# Patient Record
Sex: Male | Born: 1935 | Race: White | Hispanic: No | Marital: Married | Smoking: Former smoker
Health system: Southern US, Community
[De-identification: ages and names within clinical notes are randomized; demographics above are authoritative.]

## PROBLEM LIST (undated history)

## (undated) DIAGNOSIS — I739 Peripheral vascular disease, unspecified: Secondary | ICD-10-CM

## (undated) DIAGNOSIS — M1990 Unspecified osteoarthritis, unspecified site: Secondary | ICD-10-CM

## (undated) DIAGNOSIS — E669 Obesity, unspecified: Secondary | ICD-10-CM

## (undated) DIAGNOSIS — I10 Essential (primary) hypertension: Secondary | ICD-10-CM

## (undated) DIAGNOSIS — G4733 Obstructive sleep apnea (adult) (pediatric): Secondary | ICD-10-CM

## (undated) DIAGNOSIS — I96 Gangrene, not elsewhere classified: Secondary | ICD-10-CM

## (undated) DIAGNOSIS — I495 Sick sinus syndrome: Secondary | ICD-10-CM

## (undated) DIAGNOSIS — I2510 Atherosclerotic heart disease of native coronary artery without angina pectoris: Secondary | ICD-10-CM

## (undated) DIAGNOSIS — E785 Hyperlipidemia, unspecified: Secondary | ICD-10-CM

## (undated) DIAGNOSIS — R413 Other amnesia: Secondary | ICD-10-CM

## (undated) DIAGNOSIS — I350 Nonrheumatic aortic (valve) stenosis: Secondary | ICD-10-CM

## (undated) DIAGNOSIS — I509 Heart failure, unspecified: Secondary | ICD-10-CM

## (undated) DIAGNOSIS — Z952 Presence of prosthetic heart valve: Secondary | ICD-10-CM

## (undated) DIAGNOSIS — C4490 Unspecified malignant neoplasm of skin, unspecified: Secondary | ICD-10-CM

## (undated) DIAGNOSIS — I442 Atrioventricular block, complete: Secondary | ICD-10-CM

## (undated) DIAGNOSIS — Z87442 Personal history of urinary calculi: Secondary | ICD-10-CM

## (undated) DIAGNOSIS — N529 Male erectile dysfunction, unspecified: Secondary | ICD-10-CM

## (undated) DIAGNOSIS — Z950 Presence of cardiac pacemaker: Secondary | ICD-10-CM

## (undated) DIAGNOSIS — E119 Type 2 diabetes mellitus without complications: Secondary | ICD-10-CM

## (undated) DIAGNOSIS — I4891 Unspecified atrial fibrillation: Secondary | ICD-10-CM

## (undated) HISTORY — DX: Presence of prosthetic heart valve: Z95.2

## (undated) HISTORY — DX: Heart failure, unspecified: I50.9

## (undated) HISTORY — PX: AORTIC VALVE REPLACEMENT: SHX41

## (undated) HISTORY — DX: Peripheral vascular disease, unspecified: I73.9

## (undated) HISTORY — DX: Type 2 diabetes mellitus without complications: E11.9

## (undated) HISTORY — PX: TONSILLECTOMY: SUR1361

## (undated) HISTORY — DX: Other amnesia: R41.3

## (undated) HISTORY — DX: Presence of cardiac pacemaker: Z95.0

## (undated) HISTORY — DX: Nonrheumatic aortic (valve) stenosis: I35.0

## (undated) HISTORY — PX: EYE SURGERY: SHX253

## (undated) HISTORY — DX: Gangrene, not elsewhere classified: I96

## (undated) HISTORY — DX: Atrioventricular block, complete: I44.2

## (undated) HISTORY — DX: Personal history of urinary calculi: Z87.442

## (undated) HISTORY — DX: Essential (primary) hypertension: I10

## (undated) HISTORY — DX: Sick sinus syndrome: I49.5

## (undated) HISTORY — DX: Hyperlipidemia, unspecified: E78.5

## (undated) HISTORY — DX: Male erectile dysfunction, unspecified: N52.9

## (undated) HISTORY — DX: Obstructive sleep apnea (adult) (pediatric): G47.33

## (undated) HISTORY — DX: Unspecified atrial fibrillation: I48.91

## (undated) HISTORY — DX: Obesity, unspecified: E66.9

## (undated) HISTORY — PX: CATARACT EXTRACTION, BILATERAL: SHX1313

## (undated) HISTORY — DX: Unspecified malignant neoplasm of skin, unspecified: C44.90

## (undated) HISTORY — DX: Unspecified osteoarthritis, unspecified site: M19.90

---

## 1999-02-04 ENCOUNTER — Emergency Department (HOSPITAL_COMMUNITY): Admission: EM | Admit: 1999-02-04 | Discharge: 1999-02-04 | Payer: Self-pay

## 1999-03-20 ENCOUNTER — Ambulatory Visit (HOSPITAL_COMMUNITY): Admission: RE | Admit: 1999-03-20 | Discharge: 1999-03-22 | Payer: Self-pay | Admitting: *Deleted

## 1999-03-20 ENCOUNTER — Encounter: Payer: Self-pay | Admitting: *Deleted

## 1999-07-22 ENCOUNTER — Encounter: Payer: Self-pay | Admitting: Emergency Medicine

## 1999-07-22 ENCOUNTER — Inpatient Hospital Stay (HOSPITAL_COMMUNITY): Admission: EM | Admit: 1999-07-22 | Discharge: 1999-07-23 | Payer: Self-pay | Admitting: Emergency Medicine

## 1999-07-25 ENCOUNTER — Ambulatory Visit (HOSPITAL_COMMUNITY): Admission: RE | Admit: 1999-07-25 | Discharge: 1999-07-25 | Payer: Self-pay | Admitting: Cardiology

## 1999-07-29 ENCOUNTER — Encounter: Payer: Self-pay | Admitting: Cardiology

## 1999-07-29 ENCOUNTER — Ambulatory Visit (HOSPITAL_COMMUNITY): Admission: RE | Admit: 1999-07-29 | Discharge: 1999-07-29 | Payer: Self-pay | Admitting: Cardiology

## 1999-09-19 ENCOUNTER — Ambulatory Visit (HOSPITAL_COMMUNITY): Admission: RE | Admit: 1999-09-19 | Discharge: 1999-09-19 | Payer: Self-pay | Admitting: Cardiology

## 1999-12-09 ENCOUNTER — Encounter: Admission: RE | Admit: 1999-12-09 | Discharge: 2000-03-08 | Payer: Self-pay | Admitting: Anesthesiology

## 2000-01-30 ENCOUNTER — Ambulatory Visit (HOSPITAL_BASED_OUTPATIENT_CLINIC_OR_DEPARTMENT_OTHER): Admission: RE | Admit: 2000-01-30 | Discharge: 2000-01-30 | Payer: Self-pay | Admitting: Orthopedic Surgery

## 2000-08-14 ENCOUNTER — Inpatient Hospital Stay (HOSPITAL_COMMUNITY): Admission: EM | Admit: 2000-08-14 | Discharge: 2000-08-18 | Payer: Self-pay | Admitting: Emergency Medicine

## 2000-08-14 ENCOUNTER — Encounter: Payer: Self-pay | Admitting: Emergency Medicine

## 2001-01-19 ENCOUNTER — Other Ambulatory Visit: Admission: RE | Admit: 2001-01-19 | Discharge: 2001-01-19 | Payer: Self-pay | Admitting: Gastroenterology

## 2001-01-19 ENCOUNTER — Encounter (INDEPENDENT_AMBULATORY_CARE_PROVIDER_SITE_OTHER): Payer: Self-pay

## 2001-04-07 ENCOUNTER — Ambulatory Visit (HOSPITAL_COMMUNITY): Admission: RE | Admit: 2001-04-07 | Discharge: 2001-04-08 | Payer: Self-pay | Admitting: Cardiology

## 2001-11-05 ENCOUNTER — Ambulatory Visit (HOSPITAL_COMMUNITY): Admission: RE | Admit: 2001-11-05 | Discharge: 2001-11-05 | Payer: Self-pay | Admitting: Cardiology

## 2001-11-05 ENCOUNTER — Encounter: Payer: Self-pay | Admitting: Cardiology

## 2002-08-10 ENCOUNTER — Ambulatory Visit (HOSPITAL_COMMUNITY): Admission: RE | Admit: 2002-08-10 | Discharge: 2002-08-10 | Payer: Self-pay | Admitting: Internal Medicine

## 2002-11-15 ENCOUNTER — Encounter (HOSPITAL_BASED_OUTPATIENT_CLINIC_OR_DEPARTMENT_OTHER): Admission: RE | Admit: 2002-11-15 | Discharge: 2003-02-13 | Payer: Self-pay | Admitting: Internal Medicine

## 2002-12-30 ENCOUNTER — Encounter: Payer: Self-pay | Admitting: Emergency Medicine

## 2002-12-30 ENCOUNTER — Inpatient Hospital Stay (HOSPITAL_COMMUNITY): Admission: EM | Admit: 2002-12-30 | Discharge: 2003-01-04 | Payer: Self-pay | Admitting: Emergency Medicine

## 2003-06-06 ENCOUNTER — Encounter: Admission: RE | Admit: 2003-06-06 | Discharge: 2003-06-23 | Payer: Self-pay | Admitting: Family Medicine

## 2003-06-23 ENCOUNTER — Encounter (HOSPITAL_BASED_OUTPATIENT_CLINIC_OR_DEPARTMENT_OTHER): Admission: RE | Admit: 2003-06-23 | Discharge: 2003-07-11 | Payer: Self-pay | Admitting: Internal Medicine

## 2003-06-28 ENCOUNTER — Encounter: Admission: RE | Admit: 2003-06-28 | Discharge: 2003-09-26 | Payer: Self-pay | Admitting: Family Medicine

## 2003-09-23 ENCOUNTER — Inpatient Hospital Stay (HOSPITAL_COMMUNITY): Admission: EM | Admit: 2003-09-23 | Discharge: 2003-09-26 | Payer: Self-pay | Admitting: Emergency Medicine

## 2003-09-28 ENCOUNTER — Encounter (HOSPITAL_BASED_OUTPATIENT_CLINIC_OR_DEPARTMENT_OTHER): Admission: RE | Admit: 2003-09-28 | Discharge: 2003-10-17 | Payer: Self-pay | Admitting: Internal Medicine

## 2004-01-03 ENCOUNTER — Encounter (HOSPITAL_BASED_OUTPATIENT_CLINIC_OR_DEPARTMENT_OTHER): Admission: RE | Admit: 2004-01-03 | Discharge: 2004-01-26 | Payer: Self-pay | Admitting: Internal Medicine

## 2004-04-10 ENCOUNTER — Encounter (HOSPITAL_BASED_OUTPATIENT_CLINIC_OR_DEPARTMENT_OTHER): Admission: RE | Admit: 2004-04-10 | Discharge: 2004-04-29 | Payer: Self-pay | Admitting: Internal Medicine

## 2004-05-20 ENCOUNTER — Ambulatory Visit: Payer: Self-pay | Admitting: Internal Medicine

## 2004-06-23 DIAGNOSIS — I350 Nonrheumatic aortic (valve) stenosis: Secondary | ICD-10-CM

## 2004-06-23 HISTORY — PX: CORONARY ARTERY BYPASS GRAFT: SHX141

## 2004-06-23 HISTORY — DX: Nonrheumatic aortic (valve) stenosis: I35.0

## 2004-07-02 ENCOUNTER — Ambulatory Visit: Payer: Self-pay | Admitting: Family Medicine

## 2004-07-09 ENCOUNTER — Ambulatory Visit: Payer: Self-pay | Admitting: Cardiology

## 2004-07-10 ENCOUNTER — Ambulatory Visit: Payer: Self-pay | Admitting: Internal Medicine

## 2004-07-11 ENCOUNTER — Inpatient Hospital Stay (HOSPITAL_COMMUNITY): Admission: AD | Admit: 2004-07-11 | Discharge: 2004-07-26 | Payer: Self-pay | Admitting: Cardiology

## 2004-07-12 ENCOUNTER — Encounter: Payer: Self-pay | Admitting: Cardiology

## 2004-07-15 ENCOUNTER — Encounter (INDEPENDENT_AMBULATORY_CARE_PROVIDER_SITE_OTHER): Payer: Self-pay | Admitting: *Deleted

## 2004-07-23 ENCOUNTER — Encounter: Payer: Self-pay | Admitting: Cardiology

## 2004-07-23 ENCOUNTER — Ambulatory Visit: Payer: Self-pay | Admitting: Cardiology

## 2004-07-30 ENCOUNTER — Ambulatory Visit: Payer: Self-pay | Admitting: Cardiology

## 2004-08-01 ENCOUNTER — Encounter (HOSPITAL_BASED_OUTPATIENT_CLINIC_OR_DEPARTMENT_OTHER): Admission: RE | Admit: 2004-08-01 | Discharge: 2004-08-21 | Payer: Self-pay | Admitting: Internal Medicine

## 2004-08-02 ENCOUNTER — Ambulatory Visit: Payer: Self-pay | Admitting: Cardiology

## 2004-08-02 ENCOUNTER — Ambulatory Visit: Payer: Self-pay

## 2004-08-14 ENCOUNTER — Ambulatory Visit: Payer: Self-pay | Admitting: Cardiology

## 2004-08-30 ENCOUNTER — Ambulatory Visit: Payer: Self-pay | Admitting: Internal Medicine

## 2004-09-05 ENCOUNTER — Ambulatory Visit: Payer: Self-pay | Admitting: Cardiology

## 2004-09-09 ENCOUNTER — Ambulatory Visit: Payer: Self-pay | Admitting: Cardiology

## 2004-09-10 ENCOUNTER — Ambulatory Visit: Payer: Self-pay | Admitting: Cardiology

## 2004-09-10 ENCOUNTER — Ambulatory Visit: Payer: Self-pay | Admitting: Internal Medicine

## 2004-09-11 ENCOUNTER — Inpatient Hospital Stay (HOSPITAL_COMMUNITY): Admission: AD | Admit: 2004-09-11 | Discharge: 2004-09-13 | Payer: Self-pay | Admitting: Cardiology

## 2004-09-11 ENCOUNTER — Ambulatory Visit: Payer: Self-pay | Admitting: Cardiology

## 2004-09-12 ENCOUNTER — Encounter: Payer: Self-pay | Admitting: Cardiology

## 2004-09-12 ENCOUNTER — Ambulatory Visit: Payer: Self-pay | Admitting: Cardiology

## 2004-09-17 ENCOUNTER — Ambulatory Visit: Payer: Self-pay | Admitting: Cardiology

## 2004-09-19 ENCOUNTER — Ambulatory Visit: Payer: Self-pay | Admitting: Cardiology

## 2004-09-25 ENCOUNTER — Ambulatory Visit: Payer: Self-pay | Admitting: Internal Medicine

## 2004-09-25 ENCOUNTER — Ambulatory Visit: Payer: Self-pay | Admitting: Cardiology

## 2004-09-26 ENCOUNTER — Ambulatory Visit: Payer: Self-pay | Admitting: Family Medicine

## 2004-10-07 ENCOUNTER — Encounter (HOSPITAL_COMMUNITY): Admission: RE | Admit: 2004-10-07 | Discharge: 2005-01-05 | Payer: Self-pay | Admitting: Cardiology

## 2004-10-10 ENCOUNTER — Ambulatory Visit: Payer: Self-pay | Admitting: Internal Medicine

## 2004-10-24 ENCOUNTER — Ambulatory Visit: Payer: Self-pay | Admitting: Cardiology

## 2004-10-24 ENCOUNTER — Ambulatory Visit: Payer: Self-pay | Admitting: Internal Medicine

## 2004-10-31 ENCOUNTER — Ambulatory Visit: Payer: Self-pay | Admitting: Cardiology

## 2004-11-07 ENCOUNTER — Ambulatory Visit: Payer: Self-pay | Admitting: Cardiology

## 2004-11-13 ENCOUNTER — Ambulatory Visit: Payer: Self-pay | Admitting: *Deleted

## 2004-11-22 ENCOUNTER — Ambulatory Visit: Payer: Self-pay | Admitting: *Deleted

## 2004-12-09 ENCOUNTER — Ambulatory Visit: Payer: Self-pay | Admitting: Cardiology

## 2004-12-13 ENCOUNTER — Ambulatory Visit: Payer: Self-pay | Admitting: Cardiology

## 2004-12-16 ENCOUNTER — Ambulatory Visit: Payer: Self-pay | Admitting: Cardiology

## 2004-12-23 ENCOUNTER — Ambulatory Visit: Payer: Self-pay | Admitting: Cardiology

## 2005-01-03 ENCOUNTER — Ambulatory Visit: Payer: Self-pay | Admitting: Cardiology

## 2005-01-06 ENCOUNTER — Ambulatory Visit: Payer: Self-pay | Admitting: Internal Medicine

## 2005-01-06 ENCOUNTER — Encounter (HOSPITAL_COMMUNITY): Admission: RE | Admit: 2005-01-06 | Discharge: 2005-04-06 | Payer: Self-pay | Admitting: Cardiology

## 2005-01-07 ENCOUNTER — Ambulatory Visit: Payer: Self-pay | Admitting: Internal Medicine

## 2005-01-21 ENCOUNTER — Ambulatory Visit: Payer: Self-pay | Admitting: Internal Medicine

## 2005-02-19 ENCOUNTER — Ambulatory Visit: Payer: Self-pay | Admitting: *Deleted

## 2005-02-19 ENCOUNTER — Ambulatory Visit: Payer: Self-pay | Admitting: Cardiology

## 2005-02-26 ENCOUNTER — Ambulatory Visit: Payer: Self-pay | Admitting: Cardiology

## 2005-03-19 ENCOUNTER — Ambulatory Visit: Payer: Self-pay | Admitting: Cardiology

## 2005-04-07 ENCOUNTER — Ambulatory Visit: Payer: Self-pay | Admitting: Internal Medicine

## 2005-04-09 ENCOUNTER — Ambulatory Visit: Payer: Self-pay | Admitting: Cardiology

## 2005-04-23 ENCOUNTER — Ambulatory Visit: Payer: Self-pay | Admitting: *Deleted

## 2005-04-29 ENCOUNTER — Ambulatory Visit: Payer: Self-pay | Admitting: Cardiology

## 2005-05-07 ENCOUNTER — Ambulatory Visit: Payer: Self-pay | Admitting: Cardiology

## 2005-05-08 ENCOUNTER — Ambulatory Visit: Payer: Self-pay | Admitting: Family Medicine

## 2005-05-14 ENCOUNTER — Ambulatory Visit: Payer: Self-pay | Admitting: Internal Medicine

## 2005-05-21 ENCOUNTER — Ambulatory Visit: Payer: Self-pay | Admitting: Cardiology

## 2005-06-04 ENCOUNTER — Ambulatory Visit: Payer: Self-pay | Admitting: Cardiology

## 2005-06-11 ENCOUNTER — Ambulatory Visit: Payer: Self-pay | Admitting: Cardiology

## 2005-06-25 ENCOUNTER — Ambulatory Visit: Payer: Self-pay | Admitting: Internal Medicine

## 2005-07-02 ENCOUNTER — Ambulatory Visit: Payer: Self-pay | Admitting: *Deleted

## 2005-07-23 ENCOUNTER — Ambulatory Visit: Payer: Self-pay | Admitting: Cardiology

## 2005-07-23 ENCOUNTER — Ambulatory Visit: Payer: Self-pay | Admitting: Internal Medicine

## 2005-07-24 DIAGNOSIS — Z950 Presence of cardiac pacemaker: Secondary | ICD-10-CM

## 2005-07-24 HISTORY — PX: PACEMAKER PLACEMENT: SHX43

## 2005-07-24 HISTORY — DX: Presence of cardiac pacemaker: Z95.0

## 2005-07-29 ENCOUNTER — Ambulatory Visit: Payer: Self-pay | Admitting: Cardiology

## 2005-07-29 ENCOUNTER — Inpatient Hospital Stay (HOSPITAL_COMMUNITY): Admission: RE | Admit: 2005-07-29 | Discharge: 2005-07-30 | Payer: Self-pay | Admitting: Cardiology

## 2005-08-04 ENCOUNTER — Ambulatory Visit: Payer: Self-pay | Admitting: Internal Medicine

## 2005-08-06 ENCOUNTER — Ambulatory Visit: Payer: Self-pay | Admitting: Internal Medicine

## 2005-08-18 ENCOUNTER — Ambulatory Visit: Payer: Self-pay

## 2005-08-18 ENCOUNTER — Ambulatory Visit: Payer: Self-pay | Admitting: Internal Medicine

## 2005-08-19 ENCOUNTER — Ambulatory Visit: Payer: Self-pay | Admitting: Cardiology

## 2005-09-01 ENCOUNTER — Ambulatory Visit: Payer: Self-pay | Admitting: Cardiology

## 2005-09-05 ENCOUNTER — Ambulatory Visit: Payer: Self-pay

## 2005-09-15 ENCOUNTER — Ambulatory Visit: Payer: Self-pay | Admitting: Cardiology

## 2005-09-18 ENCOUNTER — Ambulatory Visit: Payer: Self-pay | Admitting: Cardiology

## 2005-10-01 ENCOUNTER — Ambulatory Visit: Payer: Self-pay | Admitting: *Deleted

## 2005-10-22 ENCOUNTER — Ambulatory Visit: Payer: Self-pay | Admitting: Internal Medicine

## 2005-10-22 ENCOUNTER — Ambulatory Visit: Payer: Self-pay | Admitting: Cardiology

## 2005-11-04 ENCOUNTER — Ambulatory Visit: Payer: Self-pay | Admitting: Gastroenterology

## 2005-11-05 ENCOUNTER — Ambulatory Visit: Payer: Self-pay | Admitting: Cardiology

## 2005-11-21 ENCOUNTER — Ambulatory Visit: Payer: Self-pay | Admitting: Cardiology

## 2005-12-03 ENCOUNTER — Ambulatory Visit: Payer: Self-pay | Admitting: Cardiovascular Disease

## 2005-12-29 ENCOUNTER — Ambulatory Visit: Payer: Self-pay | Admitting: Internal Medicine

## 2006-01-20 ENCOUNTER — Ambulatory Visit: Payer: Self-pay | Admitting: Internal Medicine

## 2006-01-23 ENCOUNTER — Ambulatory Visit: Payer: Self-pay | Admitting: Internal Medicine

## 2006-02-09 ENCOUNTER — Ambulatory Visit: Payer: Self-pay | Admitting: Internal Medicine

## 2006-02-25 ENCOUNTER — Ambulatory Visit: Payer: Self-pay | Admitting: *Deleted

## 2006-02-25 ENCOUNTER — Ambulatory Visit: Payer: Self-pay | Admitting: Cardiology

## 2006-03-03 ENCOUNTER — Ambulatory Visit: Payer: Self-pay | Admitting: Internal Medicine

## 2006-03-10 ENCOUNTER — Ambulatory Visit: Payer: Self-pay | Admitting: Cardiology

## 2006-03-12 ENCOUNTER — Ambulatory Visit: Payer: Self-pay

## 2006-03-12 ENCOUNTER — Encounter: Payer: Self-pay | Admitting: Cardiology

## 2006-03-25 ENCOUNTER — Ambulatory Visit: Payer: Self-pay | Admitting: Internal Medicine

## 2006-04-01 ENCOUNTER — Ambulatory Visit: Payer: Self-pay | Admitting: Internal Medicine

## 2006-04-06 ENCOUNTER — Ambulatory Visit: Payer: Self-pay | Admitting: Cardiology

## 2006-05-04 ENCOUNTER — Ambulatory Visit: Payer: Self-pay | Admitting: Cardiovascular Disease

## 2006-05-20 ENCOUNTER — Ambulatory Visit: Payer: Self-pay | Admitting: Internal Medicine

## 2006-05-25 ENCOUNTER — Ambulatory Visit: Payer: Self-pay | Admitting: Internal Medicine

## 2006-06-09 ENCOUNTER — Ambulatory Visit: Payer: Self-pay | Admitting: Cardiology

## 2006-06-30 ENCOUNTER — Ambulatory Visit: Payer: Self-pay | Admitting: Cardiology

## 2006-07-21 ENCOUNTER — Ambulatory Visit: Payer: Self-pay | Admitting: Cardiology

## 2006-07-28 DIAGNOSIS — I10 Essential (primary) hypertension: Secondary | ICD-10-CM

## 2006-07-28 DIAGNOSIS — IMO0002 Reserved for concepts with insufficient information to code with codable children: Secondary | ICD-10-CM | POA: Insufficient documentation

## 2006-07-28 DIAGNOSIS — I2510 Atherosclerotic heart disease of native coronary artery without angina pectoris: Secondary | ICD-10-CM | POA: Insufficient documentation

## 2006-07-28 DIAGNOSIS — I701 Atherosclerosis of renal artery: Secondary | ICD-10-CM

## 2006-07-28 DIAGNOSIS — K559 Vascular disorder of intestine, unspecified: Secondary | ICD-10-CM | POA: Insufficient documentation

## 2006-07-28 DIAGNOSIS — G4733 Obstructive sleep apnea (adult) (pediatric): Secondary | ICD-10-CM

## 2006-07-28 DIAGNOSIS — Z87442 Personal history of urinary calculi: Secondary | ICD-10-CM

## 2006-07-28 DIAGNOSIS — E1151 Type 2 diabetes mellitus with diabetic peripheral angiopathy without gangrene: Secondary | ICD-10-CM

## 2006-07-28 DIAGNOSIS — E1165 Type 2 diabetes mellitus with hyperglycemia: Secondary | ICD-10-CM

## 2006-08-24 ENCOUNTER — Ambulatory Visit: Payer: Self-pay | Admitting: Cardiology

## 2006-10-05 ENCOUNTER — Ambulatory Visit: Payer: Self-pay | Admitting: Cardiology

## 2006-10-13 ENCOUNTER — Ambulatory Visit: Payer: Self-pay | Admitting: Cardiology

## 2006-10-26 ENCOUNTER — Ambulatory Visit: Payer: Self-pay | Admitting: Cardiology

## 2006-10-26 ENCOUNTER — Ambulatory Visit: Payer: Self-pay | Admitting: Cardiovascular Disease

## 2006-10-26 LAB — CONVERTED CEMR LAB
ALT: 19 units/L (ref 0–40)
AST: 25 units/L (ref 0–37)
Albumin: 3.1 g/dL — ABNORMAL LOW (ref 3.5–5.2)
Basophils Absolute: 0.1 10*3/uL (ref 0.0–0.1)
Calcium: 9.7 mg/dL (ref 8.4–10.5)
Chloride: 102 meq/L (ref 96–112)
Cholesterol: 121 mg/dL (ref 0–200)
Eosinophils Absolute: 0.3 10*3/uL (ref 0.0–0.6)
GFR calc non Af Amer: 79 mL/min
MCHC: 33.4 g/dL (ref 30.0–36.0)
MCV: 87.3 fL (ref 78.0–100.0)
Platelets: 214 10*3/uL (ref 150–400)
Pro B Natriuretic peptide (BNP): 143 pg/mL — ABNORMAL HIGH (ref 0.0–100.0)
RBC: 4.9 M/uL (ref 4.22–5.81)
Total CHOL/HDL Ratio: 4
Triglycerides: 117 mg/dL (ref 0–149)

## 2006-11-23 ENCOUNTER — Ambulatory Visit: Payer: Self-pay | Admitting: Cardiovascular Disease

## 2006-11-26 ENCOUNTER — Ambulatory Visit: Payer: Self-pay | Admitting: Internal Medicine

## 2006-11-26 DIAGNOSIS — F528 Other sexual dysfunction not due to a substance or known physiological condition: Secondary | ICD-10-CM

## 2006-12-03 LAB — CONVERTED CEMR LAB
Microalb Creat Ratio: 150.8 mg/g — ABNORMAL HIGH (ref 0.0–30.0)
Microalb, Ur: 26.1 mg/dL — ABNORMAL HIGH (ref 0.0–1.9)

## 2006-12-23 ENCOUNTER — Ambulatory Visit: Payer: Self-pay | Admitting: Internal Medicine

## 2006-12-24 ENCOUNTER — Ambulatory Visit: Payer: Self-pay | Admitting: Cardiology

## 2006-12-30 ENCOUNTER — Encounter: Payer: Self-pay | Admitting: Internal Medicine

## 2007-01-15 ENCOUNTER — Encounter: Admission: RE | Admit: 2007-01-15 | Discharge: 2007-04-15 | Payer: Self-pay | Admitting: Internal Medicine

## 2007-01-29 ENCOUNTER — Telehealth (INDEPENDENT_AMBULATORY_CARE_PROVIDER_SITE_OTHER): Payer: Self-pay | Admitting: *Deleted

## 2007-02-15 ENCOUNTER — Ambulatory Visit: Payer: Self-pay | Admitting: Cardiology

## 2007-03-08 ENCOUNTER — Encounter: Payer: Self-pay | Admitting: Internal Medicine

## 2007-04-14 ENCOUNTER — Ambulatory Visit: Payer: Self-pay | Admitting: Cardiology

## 2007-04-14 ENCOUNTER — Encounter: Payer: Self-pay | Admitting: Internal Medicine

## 2007-05-11 ENCOUNTER — Ambulatory Visit: Payer: Self-pay | Admitting: Cardiology

## 2007-06-08 ENCOUNTER — Ambulatory Visit: Payer: Self-pay | Admitting: Internal Medicine

## 2007-06-10 ENCOUNTER — Ambulatory Visit: Payer: Self-pay | Admitting: Cardiology

## 2007-06-28 ENCOUNTER — Telehealth (INDEPENDENT_AMBULATORY_CARE_PROVIDER_SITE_OTHER): Payer: Self-pay | Admitting: *Deleted

## 2007-06-29 ENCOUNTER — Encounter: Admission: RE | Admit: 2007-06-29 | Discharge: 2007-06-29 | Payer: Self-pay | Admitting: Internal Medicine

## 2007-07-01 ENCOUNTER — Telehealth (INDEPENDENT_AMBULATORY_CARE_PROVIDER_SITE_OTHER): Payer: Self-pay | Admitting: *Deleted

## 2007-07-02 ENCOUNTER — Telehealth (INDEPENDENT_AMBULATORY_CARE_PROVIDER_SITE_OTHER): Payer: Self-pay | Admitting: *Deleted

## 2007-07-09 ENCOUNTER — Telehealth (INDEPENDENT_AMBULATORY_CARE_PROVIDER_SITE_OTHER): Payer: Self-pay | Admitting: *Deleted

## 2007-07-12 ENCOUNTER — Ambulatory Visit: Payer: Self-pay | Admitting: Cardiology

## 2007-07-20 ENCOUNTER — Ambulatory Visit: Payer: Self-pay | Admitting: Cardiovascular Disease

## 2007-08-17 ENCOUNTER — Ambulatory Visit: Payer: Self-pay | Admitting: Cardiology

## 2007-09-14 ENCOUNTER — Ambulatory Visit: Payer: Self-pay | Admitting: Cardiovascular Disease

## 2007-10-05 ENCOUNTER — Ambulatory Visit: Payer: Self-pay | Admitting: Cardiology

## 2007-10-05 LAB — CONVERTED CEMR LAB
BUN: 12 mg/dL (ref 6–23)
Basophils Relative: 0.2 % (ref 0.0–1.0)
CO2: 34 meq/L — ABNORMAL HIGH (ref 19–32)
Calcium: 9.5 mg/dL (ref 8.4–10.5)
Creatinine, Ser: 0.9 mg/dL (ref 0.4–1.5)
Lymphocytes Relative: 19.6 % (ref 12.0–46.0)
MCHC: 33.1 g/dL (ref 30.0–36.0)
Neutrophils Relative %: 69.9 % (ref 43.0–77.0)
Platelets: 171 10*3/uL (ref 150–400)
Pro B Natriuretic peptide (BNP): 94 pg/mL (ref 0.0–100.0)
RBC: 5.1 M/uL (ref 4.22–5.81)
WBC: 10.4 10*3/uL (ref 4.5–10.5)

## 2007-10-08 ENCOUNTER — Ambulatory Visit: Payer: Self-pay | Admitting: Cardiology

## 2007-10-08 LAB — CONVERTED CEMR LAB
BUN: 14 mg/dL (ref 6–23)
CO2: 35 meq/L — ABNORMAL HIGH (ref 19–32)
Calcium: 9.5 mg/dL (ref 8.4–10.5)
Creatinine, Ser: 1 mg/dL (ref 0.4–1.5)
Glucose, Bld: 273 mg/dL — ABNORMAL HIGH (ref 70–99)

## 2007-10-11 ENCOUNTER — Ambulatory Visit: Payer: Self-pay | Admitting: Cardiology

## 2007-10-19 ENCOUNTER — Encounter (INDEPENDENT_AMBULATORY_CARE_PROVIDER_SITE_OTHER): Payer: Self-pay | Admitting: *Deleted

## 2007-10-19 ENCOUNTER — Telehealth (INDEPENDENT_AMBULATORY_CARE_PROVIDER_SITE_OTHER): Payer: Self-pay | Admitting: *Deleted

## 2007-10-22 ENCOUNTER — Ambulatory Visit: Payer: Self-pay | Admitting: Cardiology

## 2007-10-22 LAB — CONVERTED CEMR LAB
BUN: 15 mg/dL (ref 6–23)
CO2: 33 meq/L — ABNORMAL HIGH (ref 19–32)
Calcium: 9.4 mg/dL (ref 8.4–10.5)
Creatinine, Ser: 1.1 mg/dL (ref 0.4–1.5)
GFR calc Af Amer: 85 mL/min
Glucose, Bld: 160 mg/dL — ABNORMAL HIGH (ref 70–99)

## 2007-10-26 ENCOUNTER — Ambulatory Visit: Payer: Self-pay | Admitting: Cardiovascular Disease

## 2007-10-26 ENCOUNTER — Encounter: Admission: RE | Admit: 2007-10-26 | Discharge: 2007-10-26 | Payer: Self-pay | Admitting: Internal Medicine

## 2007-11-11 ENCOUNTER — Ambulatory Visit: Payer: Self-pay

## 2007-11-11 ENCOUNTER — Encounter: Payer: Self-pay | Admitting: Cardiology

## 2007-11-11 ENCOUNTER — Ambulatory Visit: Payer: Self-pay | Admitting: Cardiology

## 2007-11-18 ENCOUNTER — Ambulatory Visit: Payer: Self-pay | Admitting: Cardiology

## 2007-11-18 LAB — CONVERTED CEMR LAB
BUN: 16 mg/dL (ref 6–23)
CO2: 35 meq/L — ABNORMAL HIGH (ref 19–32)
Calcium: 10.5 mg/dL (ref 8.4–10.5)
GFR calc Af Amer: 77 mL/min
Glucose, Bld: 153 mg/dL — ABNORMAL HIGH (ref 70–99)

## 2007-11-29 ENCOUNTER — Encounter: Payer: Self-pay | Admitting: Internal Medicine

## 2007-12-17 ENCOUNTER — Ambulatory Visit: Payer: Self-pay | Admitting: Cardiology

## 2007-12-17 ENCOUNTER — Ambulatory Visit: Payer: Self-pay | Admitting: Internal Medicine

## 2007-12-17 DIAGNOSIS — I5033 Acute on chronic diastolic (congestive) heart failure: Secondary | ICD-10-CM

## 2007-12-22 ENCOUNTER — Encounter (INDEPENDENT_AMBULATORY_CARE_PROVIDER_SITE_OTHER): Payer: Self-pay | Admitting: *Deleted

## 2007-12-22 LAB — CONVERTED CEMR LAB
LDL Cholesterol: 77 mg/dL (ref 0–99)
Saturation Ratios: 32.4 % (ref 20.0–50.0)
Total CHOL/HDL Ratio: 4.2
Transferrin: 224.6 mg/dL (ref >=212.0)
VLDL: 23 mg/dL (ref 0–40)

## 2007-12-29 ENCOUNTER — Telehealth (INDEPENDENT_AMBULATORY_CARE_PROVIDER_SITE_OTHER): Payer: Self-pay | Admitting: *Deleted

## 2008-01-03 ENCOUNTER — Encounter (INDEPENDENT_AMBULATORY_CARE_PROVIDER_SITE_OTHER): Payer: Self-pay | Admitting: *Deleted

## 2008-01-10 ENCOUNTER — Ambulatory Visit: Payer: Self-pay | Admitting: Cardiology

## 2008-01-13 ENCOUNTER — Ambulatory Visit: Payer: Self-pay | Admitting: Internal Medicine

## 2008-02-14 ENCOUNTER — Ambulatory Visit: Payer: Self-pay | Admitting: Cardiovascular Disease

## 2008-02-14 ENCOUNTER — Ambulatory Visit: Payer: Self-pay | Admitting: Cardiology

## 2008-02-14 LAB — CONVERTED CEMR LAB
BUN: 11 mg/dL (ref 6–23)
Calcium: 9.7 mg/dL (ref 8.4–10.5)
Eosinophils Absolute: 0.3 10*3/uL (ref 0.0–0.7)
Eosinophils Relative: 3.6 % (ref 0.0–5.0)
GFR calc Af Amer: 107 mL/min
GFR calc non Af Amer: 89 mL/min
Glucose, Bld: 93 mg/dL (ref 70–99)
HCT: 41.2 % (ref 39.0–52.0)
Hemoglobin: 13.9 g/dL (ref 13.0–17.0)
INR: 2.3 — ABNORMAL HIGH (ref 0.8–1.0)
MCV: 92.9 fL (ref 78.0–100.0)
Monocytes Absolute: 0.9 10*3/uL (ref 0.1–1.0)
Monocytes Relative: 9.8 % (ref 3.0–12.0)
Neutro Abs: 4.8 10*3/uL (ref 1.4–7.7)
Platelets: 227 10*3/uL (ref 150–400)
Potassium: 4.3 meq/L (ref 3.5–5.1)
RDW: 14.8 % — ABNORMAL HIGH (ref 11.5–14.6)
WBC: 8.7 10*3/uL (ref 4.5–10.5)

## 2008-04-10 ENCOUNTER — Ambulatory Visit: Payer: Self-pay | Admitting: Internal Medicine

## 2008-04-10 ENCOUNTER — Ambulatory Visit: Payer: Self-pay | Admitting: Cardiology

## 2008-04-10 LAB — CONVERTED CEMR LAB
BUN: 13 mg/dL (ref 6–23)
Calcium: 9.7 mg/dL (ref 8.4–10.5)
Creatinine, Ser: 1 mg/dL (ref 0.4–1.5)
GFR calc Af Amer: 95 mL/min
GFR calc non Af Amer: 79 mL/min
Glucose, Bld: 132 mg/dL — ABNORMAL HIGH (ref 70–99)

## 2008-05-02 ENCOUNTER — Ambulatory Visit: Payer: Self-pay | Admitting: Cardiology

## 2008-05-10 ENCOUNTER — Encounter: Payer: Self-pay | Admitting: Internal Medicine

## 2008-05-12 ENCOUNTER — Ambulatory Visit: Payer: Self-pay | Admitting: Cardiology

## 2008-05-26 ENCOUNTER — Ambulatory Visit: Payer: Self-pay | Admitting: Internal Medicine

## 2008-06-06 ENCOUNTER — Ambulatory Visit: Payer: Self-pay | Admitting: Cardiology

## 2008-06-06 ENCOUNTER — Ambulatory Visit: Payer: Self-pay | Admitting: Internal Medicine

## 2008-06-06 LAB — CONVERTED CEMR LAB
Basophils Absolute: 0 10*3/uL (ref 0.0–0.1)
Calcium: 9.8 mg/dL (ref 8.4–10.5)
GFR calc Af Amer: 95 mL/min
GFR calc non Af Amer: 78 mL/min
Glucose, Bld: 142 mg/dL — ABNORMAL HIGH (ref 70–99)
HCT: 45.9 % (ref 39.0–52.0)
MCHC: 33.4 g/dL (ref 30.0–36.0)
Monocytes Absolute: 1.1 10*3/uL — ABNORMAL HIGH (ref 0.1–1.0)
Monocytes Relative: 9.4 % (ref 3.0–12.0)
Neutro Abs: 7.3 10*3/uL (ref 1.4–7.7)
Platelets: 176 10*3/uL (ref 150–400)
Potassium: 4.2 meq/L (ref 3.5–5.1)
RDW: 13.1 % (ref 11.5–14.6)
Sodium: 140 meq/L (ref 135–145)

## 2008-07-04 ENCOUNTER — Ambulatory Visit: Payer: Self-pay | Admitting: Cardiovascular Disease

## 2008-07-10 ENCOUNTER — Ambulatory Visit: Payer: Self-pay | Admitting: Cardiology

## 2008-08-01 ENCOUNTER — Ambulatory Visit: Payer: Self-pay | Admitting: Cardiology

## 2008-08-07 ENCOUNTER — Encounter: Payer: Self-pay | Admitting: Internal Medicine

## 2008-08-16 DIAGNOSIS — E785 Hyperlipidemia, unspecified: Secondary | ICD-10-CM | POA: Insufficient documentation

## 2008-08-16 DIAGNOSIS — I7389 Other specified peripheral vascular diseases: Secondary | ICD-10-CM

## 2008-08-16 DIAGNOSIS — Z862 Personal history of diseases of the blood and blood-forming organs and certain disorders involving the immune mechanism: Secondary | ICD-10-CM | POA: Insufficient documentation

## 2008-08-16 DIAGNOSIS — I252 Old myocardial infarction: Secondary | ICD-10-CM

## 2008-08-16 DIAGNOSIS — Z952 Presence of prosthetic heart valve: Secondary | ICD-10-CM

## 2008-08-16 DIAGNOSIS — Z950 Presence of cardiac pacemaker: Secondary | ICD-10-CM | POA: Insufficient documentation

## 2008-08-16 DIAGNOSIS — Z954 Presence of other heart-valve replacement: Secondary | ICD-10-CM

## 2008-09-15 ENCOUNTER — Encounter: Payer: Self-pay | Admitting: Cardiology

## 2008-09-21 ENCOUNTER — Encounter: Payer: Self-pay | Admitting: Cardiology

## 2008-09-21 ENCOUNTER — Ambulatory Visit: Payer: Self-pay | Admitting: Cardiovascular Disease

## 2008-09-21 ENCOUNTER — Ambulatory Visit: Payer: Self-pay | Admitting: Cardiology

## 2008-09-27 ENCOUNTER — Telehealth (INDEPENDENT_AMBULATORY_CARE_PROVIDER_SITE_OTHER): Payer: Self-pay | Admitting: *Deleted

## 2008-09-28 ENCOUNTER — Ambulatory Visit: Payer: Self-pay

## 2008-09-28 ENCOUNTER — Encounter: Payer: Self-pay | Admitting: Cardiology

## 2008-09-28 ENCOUNTER — Ambulatory Visit: Payer: Self-pay | Admitting: Cardiology

## 2008-09-28 LAB — CONVERTED CEMR LAB
Chloride: 99 meq/L (ref 96–112)
GFR calc non Af Amer: 78.22 mL/min (ref >60)
Potassium: 3.7 meq/L (ref 3.5–5.1)
Sodium: 140 meq/L (ref 135–145)

## 2008-10-09 ENCOUNTER — Ambulatory Visit: Payer: Self-pay | Admitting: Cardiology

## 2008-10-13 ENCOUNTER — Ambulatory Visit: Payer: Self-pay | Admitting: Cardiology

## 2008-10-26 ENCOUNTER — Encounter: Payer: Self-pay | Admitting: Internal Medicine

## 2008-11-02 ENCOUNTER — Encounter (INDEPENDENT_AMBULATORY_CARE_PROVIDER_SITE_OTHER): Payer: Self-pay | Admitting: *Deleted

## 2008-11-07 ENCOUNTER — Ambulatory Visit: Payer: Self-pay | Admitting: Internal Medicine

## 2008-11-07 ENCOUNTER — Encounter: Payer: Self-pay | Admitting: Internal Medicine

## 2008-11-21 ENCOUNTER — Encounter: Payer: Self-pay | Admitting: *Deleted

## 2008-11-23 ENCOUNTER — Encounter: Payer: Self-pay | Admitting: Cardiology

## 2008-11-23 ENCOUNTER — Encounter: Payer: Self-pay | Admitting: Internal Medicine

## 2008-12-05 ENCOUNTER — Encounter: Admission: RE | Admit: 2008-12-05 | Discharge: 2008-12-05 | Payer: Self-pay | Admitting: Neurology

## 2008-12-05 ENCOUNTER — Ambulatory Visit: Payer: Self-pay | Admitting: Internal Medicine

## 2008-12-05 ENCOUNTER — Encounter (INDEPENDENT_AMBULATORY_CARE_PROVIDER_SITE_OTHER): Payer: Self-pay | Admitting: Cardiology

## 2008-12-05 LAB — CONVERTED CEMR LAB
POC INR: 1.7
Protime: 16.2

## 2008-12-22 ENCOUNTER — Telehealth: Payer: Self-pay | Admitting: Internal Medicine

## 2008-12-22 ENCOUNTER — Encounter: Payer: Self-pay | Admitting: Cardiovascular Disease

## 2008-12-22 ENCOUNTER — Encounter: Payer: Self-pay | Admitting: Cardiology

## 2008-12-22 LAB — CONVERTED CEMR LAB: Blood Glucose, Fingerstick: 69

## 2008-12-27 ENCOUNTER — Encounter: Payer: Self-pay | Admitting: *Deleted

## 2009-01-02 ENCOUNTER — Ambulatory Visit: Payer: Self-pay | Admitting: Cardiovascular Disease

## 2009-01-02 ENCOUNTER — Ambulatory Visit: Payer: Self-pay | Admitting: Cardiology

## 2009-01-02 ENCOUNTER — Encounter (INDEPENDENT_AMBULATORY_CARE_PROVIDER_SITE_OTHER): Payer: Self-pay | Admitting: Cardiology

## 2009-01-02 ENCOUNTER — Ambulatory Visit (HOSPITAL_COMMUNITY): Admission: RE | Admit: 2009-01-02 | Discharge: 2009-01-02 | Payer: Self-pay | Admitting: Cardiology

## 2009-01-02 DIAGNOSIS — I739 Peripheral vascular disease, unspecified: Secondary | ICD-10-CM

## 2009-01-02 DIAGNOSIS — M25559 Pain in unspecified hip: Secondary | ICD-10-CM | POA: Insufficient documentation

## 2009-01-02 LAB — CONVERTED CEMR LAB
POC INR: 4
Prothrombin Time: 24.2 s

## 2009-01-04 ENCOUNTER — Encounter: Payer: Self-pay | Admitting: Cardiology

## 2009-01-04 ENCOUNTER — Ambulatory Visit: Payer: Self-pay

## 2009-01-08 ENCOUNTER — Encounter: Payer: Self-pay | Admitting: Cardiology

## 2009-01-12 ENCOUNTER — Telehealth (INDEPENDENT_AMBULATORY_CARE_PROVIDER_SITE_OTHER): Payer: Self-pay | Admitting: *Deleted

## 2009-01-16 ENCOUNTER — Ambulatory Visit: Payer: Self-pay | Admitting: Cardiology

## 2009-01-16 LAB — CONVERTED CEMR LAB
POC INR: 1.9
Prothrombin Time: 17.1 s

## 2009-02-12 ENCOUNTER — Telehealth (INDEPENDENT_AMBULATORY_CARE_PROVIDER_SITE_OTHER): Payer: Self-pay | Admitting: *Deleted

## 2009-02-13 ENCOUNTER — Encounter (INDEPENDENT_AMBULATORY_CARE_PROVIDER_SITE_OTHER): Payer: Self-pay | Admitting: *Deleted

## 2009-02-13 ENCOUNTER — Ambulatory Visit: Payer: Self-pay | Admitting: Cardiovascular Disease

## 2009-02-13 ENCOUNTER — Ambulatory Visit: Payer: Self-pay | Admitting: Internal Medicine

## 2009-02-19 ENCOUNTER — Ambulatory Visit: Payer: Self-pay | Admitting: Internal Medicine

## 2009-02-23 ENCOUNTER — Encounter: Payer: Self-pay | Admitting: Internal Medicine

## 2009-03-02 ENCOUNTER — Ambulatory Visit: Payer: Self-pay | Admitting: Cardiovascular Disease

## 2009-03-02 LAB — CONVERTED CEMR LAB
BUN: 16 mg/dL (ref 6–23)
Basophils Absolute: 0 10*3/uL (ref 0.0–0.1)
Calcium: 9.1 mg/dL (ref 8.4–10.5)
Eosinophils Relative: 4 % (ref 0.0–5.0)
GFR calc non Af Amer: 88.22 mL/min (ref >60)
Glucose, Bld: 86 mg/dL (ref 70–99)
INR: 2.5 — ABNORMAL HIGH (ref 0.8–1.0)
Lymphocytes Relative: 26.9 % (ref 12.0–46.0)
Monocytes Relative: 9.7 % (ref 3.0–12.0)
Platelets: 180 10*3/uL (ref 150.0–400.0)
Potassium: 4.2 meq/L (ref 3.5–5.1)
Prothrombin Time: 26.2 s — ABNORMAL HIGH (ref 9.1–11.7)
RDW: 13 % (ref 11.5–14.6)
Sodium: 139 meq/L (ref 135–145)
WBC: 9.5 10*3/uL (ref 4.5–10.5)

## 2009-03-09 ENCOUNTER — Telehealth (INDEPENDENT_AMBULATORY_CARE_PROVIDER_SITE_OTHER): Payer: Self-pay | Admitting: Physician Assistant

## 2009-03-09 ENCOUNTER — Ambulatory Visit: Payer: Self-pay | Admitting: Cardiovascular Disease

## 2009-03-09 ENCOUNTER — Ambulatory Visit (HOSPITAL_COMMUNITY): Admission: RE | Admit: 2009-03-09 | Discharge: 2009-03-09 | Payer: Self-pay | Admitting: Cardiovascular Disease

## 2009-03-13 ENCOUNTER — Ambulatory Visit: Payer: Self-pay | Admitting: Cardiology

## 2009-03-13 LAB — CONVERTED CEMR LAB: POC INR: 1.2

## 2009-03-19 ENCOUNTER — Ambulatory Visit: Payer: Self-pay | Admitting: Cardiology

## 2009-03-19 LAB — CONVERTED CEMR LAB: POC INR: 1.4

## 2009-03-26 ENCOUNTER — Ambulatory Visit: Payer: Self-pay | Admitting: Internal Medicine

## 2009-04-02 ENCOUNTER — Ambulatory Visit: Payer: Self-pay | Admitting: Internal Medicine

## 2009-04-02 LAB — CONVERTED CEMR LAB: POC INR: 4.2

## 2009-04-03 ENCOUNTER — Encounter: Payer: Self-pay | Admitting: Internal Medicine

## 2009-04-09 ENCOUNTER — Ambulatory Visit: Payer: Self-pay | Admitting: Cardiology

## 2009-04-23 ENCOUNTER — Ambulatory Visit: Payer: Self-pay | Admitting: Internal Medicine

## 2009-04-23 ENCOUNTER — Ambulatory Visit: Payer: Self-pay | Admitting: Cardiology

## 2009-04-24 ENCOUNTER — Ambulatory Visit: Payer: Self-pay | Admitting: Internal Medicine

## 2009-04-26 ENCOUNTER — Telehealth (INDEPENDENT_AMBULATORY_CARE_PROVIDER_SITE_OTHER): Payer: Self-pay | Admitting: *Deleted

## 2009-04-26 LAB — CONVERTED CEMR LAB
BUN: 16 mg/dL (ref 6–23)
Chloride: 98 meq/L (ref 96–112)
Creatinine, Ser: 1.1 mg/dL (ref 0.4–1.5)

## 2009-05-28 ENCOUNTER — Encounter: Payer: Self-pay | Admitting: Cardiovascular Disease

## 2009-05-29 ENCOUNTER — Ambulatory Visit: Payer: Self-pay

## 2009-05-29 ENCOUNTER — Ambulatory Visit: Payer: Self-pay | Admitting: Cardiovascular Disease

## 2009-05-29 ENCOUNTER — Ambulatory Visit: Payer: Self-pay | Admitting: Internal Medicine

## 2009-05-29 LAB — CONVERTED CEMR LAB: POC INR: 2.2

## 2009-06-04 ENCOUNTER — Telehealth: Payer: Self-pay | Admitting: Internal Medicine

## 2009-06-12 ENCOUNTER — Encounter: Payer: Self-pay | Admitting: Internal Medicine

## 2009-06-14 ENCOUNTER — Telehealth: Payer: Self-pay | Admitting: Cardiology

## 2009-06-26 ENCOUNTER — Ambulatory Visit: Payer: Self-pay | Admitting: Internal Medicine

## 2009-06-26 ENCOUNTER — Ambulatory Visit: Payer: Self-pay | Admitting: Cardiology

## 2009-06-28 ENCOUNTER — Ambulatory Visit: Payer: Self-pay | Admitting: Internal Medicine

## 2009-07-02 ENCOUNTER — Encounter (INDEPENDENT_AMBULATORY_CARE_PROVIDER_SITE_OTHER): Payer: Self-pay | Admitting: *Deleted

## 2009-07-02 ENCOUNTER — Telehealth (INDEPENDENT_AMBULATORY_CARE_PROVIDER_SITE_OTHER): Payer: Self-pay | Admitting: *Deleted

## 2009-07-02 LAB — CONVERTED CEMR LAB
Cholesterol: 120 mg/dL (ref 0–200)
Creatinine,U: 162.7 mg/dL
HDL: 32.3 mg/dL — ABNORMAL LOW (ref >39.00)
Microalb Creat Ratio: 69.5 mg/g — ABNORMAL HIGH (ref 0.0–30.0)
Total CHOL/HDL Ratio: 4
Triglycerides: 114 mg/dL (ref 0.0–149.0)

## 2009-07-05 ENCOUNTER — Encounter: Payer: Self-pay | Admitting: Internal Medicine

## 2009-07-09 ENCOUNTER — Ambulatory Visit: Payer: Self-pay | Admitting: Cardiology

## 2009-07-10 ENCOUNTER — Ambulatory Visit: Payer: Self-pay | Admitting: Cardiology

## 2009-08-06 ENCOUNTER — Telehealth (INDEPENDENT_AMBULATORY_CARE_PROVIDER_SITE_OTHER): Payer: Self-pay | Admitting: *Deleted

## 2009-08-07 ENCOUNTER — Ambulatory Visit: Payer: Self-pay | Admitting: Cardiology

## 2009-08-14 ENCOUNTER — Telehealth (INDEPENDENT_AMBULATORY_CARE_PROVIDER_SITE_OTHER): Payer: Self-pay | Admitting: *Deleted

## 2009-08-20 ENCOUNTER — Encounter: Payer: Self-pay | Admitting: Internal Medicine

## 2009-08-23 ENCOUNTER — Ambulatory Visit: Payer: Self-pay | Admitting: Cardiology

## 2009-09-03 ENCOUNTER — Ambulatory Visit: Payer: Self-pay | Admitting: Internal Medicine

## 2009-09-04 ENCOUNTER — Ambulatory Visit: Payer: Self-pay

## 2009-09-04 ENCOUNTER — Ambulatory Visit: Payer: Self-pay | Admitting: Cardiology

## 2009-09-04 LAB — CONVERTED CEMR LAB: POC INR: 2.6

## 2009-09-11 LAB — CONVERTED CEMR LAB
CO2: 32 meq/L (ref 19–32)
Calcium: 9.7 mg/dL (ref 8.4–10.5)
Creatinine, Ser: 1 mg/dL (ref 0.4–1.5)
GFR calc non Af Amer: 78.01 mL/min (ref >60)
Hgb A1c MFr Bld: 8.6 % — ABNORMAL HIGH (ref 4.6–6.5)
Microalb Creat Ratio: 83.1 mg/g — ABNORMAL HIGH (ref 0.0–30.0)
Microalb, Ur: 6.2 mg/dL — ABNORMAL HIGH (ref 0.0–1.9)
PSA: 1.02 ng/mL (ref 0.10–4.00)
Sodium: 138 meq/L (ref 135–145)

## 2009-10-02 ENCOUNTER — Ambulatory Visit: Payer: Self-pay | Admitting: Cardiovascular Disease

## 2009-10-02 ENCOUNTER — Ambulatory Visit: Payer: Self-pay | Admitting: Cardiology

## 2009-10-02 ENCOUNTER — Ambulatory Visit: Payer: Self-pay

## 2009-10-02 ENCOUNTER — Encounter: Payer: Self-pay | Admitting: Cardiology

## 2009-10-02 ENCOUNTER — Ambulatory Visit (HOSPITAL_COMMUNITY): Admission: RE | Admit: 2009-10-02 | Discharge: 2009-10-02 | Payer: Self-pay | Admitting: Cardiology

## 2009-10-02 LAB — CONVERTED CEMR LAB: POC INR: 2.8

## 2009-10-08 ENCOUNTER — Ambulatory Visit: Payer: Self-pay | Admitting: Cardiology

## 2009-10-30 ENCOUNTER — Ambulatory Visit: Payer: Self-pay | Admitting: Cardiovascular Disease

## 2009-11-01 ENCOUNTER — Encounter: Payer: Self-pay | Admitting: Internal Medicine

## 2009-11-26 ENCOUNTER — Ambulatory Visit: Payer: Self-pay | Admitting: Internal Medicine

## 2009-11-30 ENCOUNTER — Encounter: Payer: Self-pay | Admitting: Cardiovascular Disease

## 2009-12-04 ENCOUNTER — Ambulatory Visit: Payer: Self-pay | Admitting: Internal Medicine

## 2009-12-06 ENCOUNTER — Telehealth (INDEPENDENT_AMBULATORY_CARE_PROVIDER_SITE_OTHER): Payer: Self-pay | Admitting: *Deleted

## 2009-12-06 LAB — CONVERTED CEMR LAB
Hgb A1c MFr Bld: 7.9 % — ABNORMAL HIGH (ref 4.6–6.5)
Microalb Creat Ratio: 3.9 mg/g (ref 0.0–30.0)
Microalb, Ur: 5 mg/dL — ABNORMAL HIGH (ref 0.0–1.9)

## 2009-12-07 ENCOUNTER — Encounter: Payer: Self-pay | Admitting: Cardiovascular Disease

## 2009-12-13 ENCOUNTER — Encounter: Payer: Self-pay | Admitting: Cardiology

## 2009-12-18 ENCOUNTER — Ambulatory Visit: Payer: Self-pay | Admitting: Cardiovascular Disease

## 2009-12-28 ENCOUNTER — Encounter (HOSPITAL_BASED_OUTPATIENT_CLINIC_OR_DEPARTMENT_OTHER): Admission: RE | Admit: 2009-12-28 | Discharge: 2010-02-10 | Payer: Self-pay | Admitting: General Surgery

## 2009-12-31 ENCOUNTER — Ambulatory Visit: Payer: Self-pay | Admitting: Cardiology

## 2009-12-31 ENCOUNTER — Ambulatory Visit: Payer: Self-pay | Admitting: Internal Medicine

## 2010-01-04 ENCOUNTER — Ambulatory Visit (HOSPITAL_COMMUNITY): Admission: RE | Admit: 2010-01-04 | Discharge: 2010-01-04 | Payer: Self-pay | Admitting: General Surgery

## 2010-01-07 ENCOUNTER — Ambulatory Visit: Payer: Self-pay | Admitting: Cardiology

## 2010-01-08 ENCOUNTER — Telehealth: Payer: Self-pay | Admitting: Cardiology

## 2010-01-10 ENCOUNTER — Encounter (HOSPITAL_BASED_OUTPATIENT_CLINIC_OR_DEPARTMENT_OTHER): Payer: Self-pay | Admitting: General Surgery

## 2010-01-10 ENCOUNTER — Ambulatory Visit: Payer: Self-pay | Admitting: Cardiology

## 2010-01-10 ENCOUNTER — Ambulatory Visit: Payer: Self-pay

## 2010-01-10 ENCOUNTER — Ambulatory Visit (HOSPITAL_COMMUNITY): Admission: RE | Admit: 2010-01-10 | Discharge: 2010-01-10 | Payer: Self-pay | Admitting: General Surgery

## 2010-01-10 ENCOUNTER — Encounter: Payer: Self-pay | Admitting: Cardiovascular Disease

## 2010-01-11 ENCOUNTER — Telehealth: Payer: Self-pay | Admitting: Cardiology

## 2010-01-21 HISTORY — PX: AMPUTATION: SHX166

## 2010-01-26 ENCOUNTER — Telehealth: Payer: Self-pay | Admitting: Internal Medicine

## 2010-01-26 DIAGNOSIS — E041 Nontoxic single thyroid nodule: Secondary | ICD-10-CM

## 2010-01-29 ENCOUNTER — Ambulatory Visit: Payer: Self-pay | Admitting: Cardiology

## 2010-01-29 LAB — CONVERTED CEMR LAB: POC INR: 1.8

## 2010-01-31 ENCOUNTER — Encounter: Admission: RE | Admit: 2010-01-31 | Discharge: 2010-01-31 | Payer: Self-pay | Admitting: Internal Medicine

## 2010-02-07 ENCOUNTER — Emergency Department (HOSPITAL_COMMUNITY): Admission: EM | Admit: 2010-02-07 | Discharge: 2010-02-07 | Payer: Self-pay | Admitting: Emergency Medicine

## 2010-02-08 ENCOUNTER — Inpatient Hospital Stay (HOSPITAL_COMMUNITY): Admission: EM | Admit: 2010-02-08 | Discharge: 2010-02-22 | Payer: Self-pay | Admitting: Emergency Medicine

## 2010-02-08 ENCOUNTER — Ambulatory Visit: Payer: Self-pay | Admitting: Cardiology

## 2010-02-09 ENCOUNTER — Ambulatory Visit: Payer: Self-pay | Admitting: Surgery

## 2010-02-10 HISTORY — PX: ANGIOPLASTY / STENTING FEMORAL: SUR30

## 2010-02-12 ENCOUNTER — Encounter: Payer: Self-pay | Admitting: Internal Medicine

## 2010-02-14 ENCOUNTER — Encounter (INDEPENDENT_AMBULATORY_CARE_PROVIDER_SITE_OTHER): Payer: Self-pay | Admitting: Internal Medicine

## 2010-02-15 ENCOUNTER — Telehealth (INDEPENDENT_AMBULATORY_CARE_PROVIDER_SITE_OTHER): Payer: Self-pay | Admitting: *Deleted

## 2010-02-26 ENCOUNTER — Encounter: Payer: Self-pay | Admitting: Internal Medicine

## 2010-03-05 ENCOUNTER — Telehealth: Payer: Self-pay | Admitting: Internal Medicine

## 2010-03-05 ENCOUNTER — Ambulatory Visit: Payer: Self-pay | Admitting: Internal Medicine

## 2010-03-18 ENCOUNTER — Ambulatory Visit: Payer: Self-pay | Admitting: Surgery

## 2010-03-18 ENCOUNTER — Ambulatory Visit: Payer: Self-pay | Admitting: Cardiovascular Disease

## 2010-03-18 LAB — CONVERTED CEMR LAB: POC INR: 2.4

## 2010-04-04 ENCOUNTER — Ambulatory Visit: Payer: Self-pay | Admitting: Internal Medicine

## 2010-04-08 ENCOUNTER — Ambulatory Visit: Payer: Self-pay | Admitting: Cardiology

## 2010-04-09 ENCOUNTER — Ambulatory Visit: Payer: Self-pay | Admitting: Cardiology

## 2010-04-29 ENCOUNTER — Ambulatory Visit: Payer: Self-pay | Admitting: Surgery

## 2010-05-20 ENCOUNTER — Ambulatory Visit: Payer: Self-pay | Admitting: Surgery

## 2010-05-27 ENCOUNTER — Ambulatory Visit: Payer: Self-pay | Admitting: Internal Medicine

## 2010-05-27 ENCOUNTER — Encounter: Payer: Self-pay | Admitting: Cardiology

## 2010-05-27 ENCOUNTER — Ambulatory Visit: Payer: Self-pay | Admitting: Cardiology

## 2010-05-28 ENCOUNTER — Ambulatory Visit: Payer: Self-pay | Admitting: Cardiology

## 2010-05-28 ENCOUNTER — Telehealth: Payer: Self-pay | Admitting: Cardiology

## 2010-06-03 ENCOUNTER — Ambulatory Visit: Payer: Self-pay | Admitting: Surgery

## 2010-06-10 ENCOUNTER — Telehealth: Payer: Self-pay | Admitting: Internal Medicine

## 2010-07-01 ENCOUNTER — Ambulatory Visit: Admit: 2010-07-01 | Payer: Self-pay | Admitting: Surgery

## 2010-07-01 ENCOUNTER — Ambulatory Visit: Admission: RE | Admit: 2010-07-01 | Discharge: 2010-07-01 | Payer: Self-pay | Source: Home / Self Care

## 2010-07-01 LAB — CONVERTED CEMR LAB: POC INR: 1.9

## 2010-07-05 ENCOUNTER — Ambulatory Visit
Admission: RE | Admit: 2010-07-05 | Discharge: 2010-07-05 | Payer: Self-pay | Source: Home / Self Care | Attending: Internal Medicine | Admitting: Internal Medicine

## 2010-07-05 ENCOUNTER — Other Ambulatory Visit: Payer: Self-pay | Admitting: Internal Medicine

## 2010-07-05 LAB — LIPID PANEL
Cholesterol: 122 mg/dL (ref 0–200)
HDL: 28.5 mg/dL — ABNORMAL LOW (ref >39.00)
LDL Cholesterol: 71 mg/dL (ref 0–99)
Total CHOL/HDL Ratio: 4
Triglycerides: 112 mg/dL (ref 0.0–149.0)
VLDL: 22.4 mg/dL (ref 0.0–40.0)

## 2010-07-05 LAB — BASIC METABOLIC PANEL
BUN: 16 mg/dL (ref 6–23)
CO2: 29 mEq/L (ref 19–32)
Calcium: 9.7 mg/dL (ref 8.4–10.5)
Chloride: 100 mEq/L (ref 96–112)
Creatinine, Ser: 1.1 mg/dL (ref 0.4–1.5)
GFR: 70.46 mL/min (ref >60.00)
Glucose, Bld: 86 mg/dL (ref 70–99)
Potassium: 4.7 mEq/L (ref 3.5–5.1)
Sodium: 138 mEq/L (ref 135–145)

## 2010-07-05 LAB — AST: AST: 24 U/L (ref 0–37)

## 2010-07-05 LAB — ALT: ALT: 14 U/L (ref 0–53)

## 2010-07-05 LAB — HEMOGLOBIN A1C: Hgb A1c MFr Bld: 8 % — ABNORMAL HIGH (ref 4.6–6.5)

## 2010-07-08 ENCOUNTER — Encounter: Payer: Self-pay | Admitting: Internal Medicine

## 2010-07-14 ENCOUNTER — Encounter: Payer: Self-pay | Admitting: Internal Medicine

## 2010-07-14 ENCOUNTER — Encounter: Payer: Self-pay | Admitting: Surgery

## 2010-07-15 ENCOUNTER — Encounter: Payer: Self-pay | Admitting: Neurology

## 2010-07-15 ENCOUNTER — Ambulatory Visit: Admit: 2010-07-15 | Payer: Self-pay | Admitting: Cardiology

## 2010-07-16 ENCOUNTER — Ambulatory Visit: Payer: Self-pay | Admitting: Cardiology

## 2010-07-16 NOTE — Assessment & Plan Note (Addendum)
OFFICE VISIT  [name], [name] DOB:  [birth year]                                       07/15/2010 [id number]#:[id number]  The patient comes back in today for followup.  He is status post subintimal recanalization of the left anterior tibial artery with subsequent balloon angioplasty as well as stenting to his left superficial femoral artery.  These were done on 02/10/2010.  Following percutaneous revascularizations he had left great toe amputation on 02/14/2010.  The toe continues to heal.  He is no longer doing dressing changes.  PHYSICAL EXAMINATION:  Vital signs:  Heart rate 73, blood pressure 161/76, temperature is 97.4.  General:  He is well-appearing, in no distress.  Musculoskeletal:  Minimal edema.  Skin:  Without rash.  There is slight blanching around the skin edges.  The wound is healed.  There was some scab like material on the tip of his great toe which I sharply debrided.  The wound appears to be completely closed.  Because of the debulking I did with the scabbing I want to make sure this does not open back up.  He is going to follow up with me in 6 weeks and at that time will get a repeat ultrasound.    [name], MD Electronically Signed  VWB/MEDQ  D:  07/15/2010  T:  07/16/2010  Job:  [id number]

## 2010-07-21 LAB — CONVERTED CEMR LAB
ALT: 19 units/L (ref 0–53)
AST: 22 units/L (ref 0–37)
Albumin: 3.5 g/dL (ref 3.5–5.2)
BUN: 11 mg/dL
BUN: 11 mg/dL
BUN: 15 mg/dL (ref 6–23)
Basophils Absolute: 0 10*3/uL (ref 0.0–0.1)
Basophils Absolute: 0 10*3/uL (ref 0.0–0.1)
Basophils Absolute: 0.1 10*3/uL (ref 0.0–0.1)
Basophils Relative: 0.1 % (ref 0.0–3.0)
CO2, serum: 30 mmol/L
CO2, serum: 30 mmol/L
CO2: 34 meq/L — ABNORMAL HIGH (ref 19–32)
Calcium: 10.5 mg/dL (ref 8.4–10.5)
Calcium: 9.7 mg/dL (ref 8.4–10.5)
Chloride, Serum: 106 mmol/L
Chloride: 98 meq/L (ref 96–112)
Cholesterol: 122 mg/dL (ref 0–200)
Creatinine, Ser: 0.9 mg/dL
Creatinine, Ser: 0.9 mg/dL
Creatinine, Ser: 1 mg/dL (ref 0.4–1.5)
Creatinine, Ser: 1 mg/dL (ref 0.4–1.5)
Creatinine, Ser: 1.1 mg/dL (ref 0.4–1.5)
Eosinophils Absolute: 0.3 10*3/uL (ref 0.0–0.7)
Eosinophils Absolute: 0.4 10*3/uL (ref 0.0–0.7)
Eosinophils Absolute: 0.4 10*3/uL (ref 0.0–0.7)
GFR calc non Af Amer: 72.87 mL/min (ref >60)
GFR calc non Af Amer: 78.01 mL/min (ref >60)
GFR calc non Af Amer: 78.16 mL/min (ref >60)
Glucose, Bld: 103 mg/dL — ABNORMAL HIGH (ref 70–99)
Glucose, Bld: 229 mg/dL
HCT: 40.4 % (ref 39.0–52.0)
HDL: 39.3 mg/dL (ref >39.00)
Hemoglobin: 13.9 g/dL (ref 13.0–17.0)
Hgb A1c MFr Bld: 8.7 %
Hgb A1c MFr Bld: 9.7 %
Lymphocytes Relative: 23.8 % (ref 12.0–46.0)
Lymphocytes Relative: 26.8 % (ref 12.0–46.0)
Lymphs Abs: 2.9 10*3/uL (ref 0.7–4.0)
MCHC: 33.9 g/dL (ref 30.0–36.0)
MCHC: 34.3 g/dL (ref 30.0–36.0)
MCHC: 34.5 g/dL (ref 30.0–36.0)
MCV: 92.9 fL (ref 78.0–100.0)
Monocytes Absolute: 1.2 10*3/uL — ABNORMAL HIGH (ref 0.1–1.0)
Monocytes Relative: 7.4 % (ref 3.0–12.0)
Neutro Abs: 7.6 10*3/uL (ref 1.4–7.7)
Neutrophils Relative %: 60.1 % (ref 43.0–77.0)
Neutrophils Relative %: 64.7 % (ref 43.0–77.0)
Platelets: 197 10*3/uL (ref 150.0–400.0)
Potassium, serum: 4.6 mmol/L
Potassium, serum: 4.6 mmol/L
Potassium: 4.1 meq/L (ref 3.5–5.1)
RBC: 4.43 M/uL (ref 4.22–5.81)
RDW: 12.9 % (ref 11.5–14.6)
RDW: 13.6 % (ref 11.5–14.6)
RDW: 14.4 % (ref 11.5–14.6)
Total Protein: 7.8 g/dL (ref 6.0–8.3)
Triglycerides: 132 mg/dL (ref 0.0–149.0)

## 2010-07-23 NOTE — Assessment & Plan Note (Signed)
Summary: PER CHECK OUT   Primary Provider:  Nolon Rod. Paz MD  CC:  pt complains of sob.  History of Present Illness: The patient is 76 years old and return for management of valvular heart disease, diastolic CHF, CAD, and atrial fibrillation. Several years ago he had bypass surgery and aortic valve replacement with a tissue valve. He has a long history of LVH and diastolic dysfunction with good systolic function with an ejection fraction of 60-65%. He also has chronic atrial fibrillation and has a VVI pacemaker.  He's been doing well over the past 4 months. He feels like his food is under pretty good control. He takes an extra Lasix if he feels he is retaining some fluid. He's had no chest pain and his exercise tolerance appears stable.  His other problems include diabetes and hyperlipidemia. He also has peripheral vascular disease with small vessel disease by angiography last year.  Current Medications (verified): 1)  Lantus 100 Unit/ml Soln (Insulin Glargine) .... 85 Subcutaneously As Directed At Bedtime 2)  Glucophage Xr 500 Mg Tb24 (Metformin Hcl) .... Take 1 Tablet By Mouth Twice A Day 3)  Apidra 100 Unit/ml Soln (Insulin Glulisine) .... Sliding Scale 4)  Vytorin 10-40 Mg Tabs (Ezetimibe-Simvastatin) .... Take 1 Tablet By Mouth Once A Day 5)  Lasix 80 Mg Tabs (Furosemide) .... 2 Tabs in The Am 6)  Spironolactone 25 Mg  Tabs (Spironolactone) .... 2 By Mouth Once Daily 7)  Aspirin 81 Mg Tbec (Aspirin) .... Take One Tablet By Mouth Daily 8)  Lisinopril-Hydrochlorothiazide 20-12.5 Mg Tabs (Lisinopril-Hydrochlorothiazide) .... Take 1 Tablet By Mouth Once A Day 9)  Warfarin Sodium 5 Mg Tabs (Warfarin Sodium) .... Use As Directed By Anticoagulation Clinic 10)  Diazepam 2 Mg Tabs (Diazepam) .Marland Kitchen.. 1 By Mouth Two Times A Day 11)  Diabetic Shoes .... Dx----Diabetes Dx --- Pvd (Poor Circulation)  Allergies: 1)  ! Toprol Xl 2)  ! * Iv Potassium 3)  Actos (Pioglitazone Hcl)  Past History:  Past  Medical History: Reviewed history from 06/26/2009 and no changes required.    DIABETES MELLITUS, TYPE II ----used to see Dr Roanna Raider, now f/u by Dr Drue Novel    (+) retinopathy, cataracts (sees [facility])    HTN    AORTIC VALVE REPLACEMENT (biophrostetic) for Ao Stenosis 2006    CABG 2006    ATRIAL FIBRILLATION , onset after CABG    CHF after CABG    Pacemaker 2-07 VVI    PVD :        08-2005: CT  angio showed-- vascular insuff, intestine and RAS bilaterally    angiogram 02-2009: severe PVD medical mangment      E.D.    OBESITY, MORBID      OBSTRUCTIVE SLEEP APNEA-- not using CPAP    NEPHROLITHIASIS, HX OF       Review of Systems       ROS is negative except as outlined in HPI.   Vital Signs:  Patient profile:   76 year old male Height:      69 inches Weight:      266 pounds BMI:     39.42 Pulse rate:   81 / minute Resp:     18 per minute BP sitting:   135 / 68  (left arm)  Vitals Entered By: Kem Parkinson (September 04, 2009 10:13 AM)  Physical Exam  Additional Exam:  Gen. Well-nourished, in no distress   Neck: No JVD, thyroid not enlarged, no carotid bruits Lungs:  No tachypnea, clear without rales, rhonchi or wheezes Cardiovascular: Rhythm regular, PMI not displaced,  heart sounds  normal, no murmurs or gallops, trace to 1+ peripheral edema, pulses normal in all 4 extremities. Abdomen: BS normal, abdomen soft and non-tender without masses or organomegaly, no hepatosplenomegaly. MS: No deformities, no cyanosis or clubbing   Neuro:  No focal sns   Skin:  no lesions    PPM Specifications Following MD:  Everardo Beals. Juanda Chance, MD     PPM Vendor:  [employer]     PPM Model Number:  [id number]     PPM Serial Number:  [id number] Kathie Rhodes PPM DOI:  0206/2007     PPM Implanting MD:  Everardo Beals. Juanda Chance, MD  Lead 1    Location: RV     DOI: 07/29/2005     Model #: [id number]     Serial #: [id number]     Status: active  Magnet Response Rate:  BOL 85 ERI  65  Indications:  CHF; A-fib  Explantation  Comments:  TTM's with [employer]  PPM Follow Up Remote Check?  No Battery Voltage:  2.78 V     Battery Est. Longevity:  6 years     Pacer Dependent:  Yes     Right Ventricle  Impedance: 639 ohms, Threshold: 1.0 V at 0.6 msec  Episodes Coumadin:  Yes Ventricular High Rate:  20     Ventricular Pacing:  99%  Parameters Mode:  VVIR     Lower Rate Limit:  60     Upper Rate Limit:  120 Rate Response Parameters:  Threshold low.  Slope 7. Next Cardiology Appt Due:  02/21/2010 Tech Comments:  RV 2.5@0 .6.  20 VHR episodes the longest was 2 seconds.  ROV 6 months in the clinic. Altha Harm, LPN  September 04, 2009 11:26 AM   Impression & Recommendations:  Problem # 1:  CORONARY ARTERY BYPASS GRAFT, HX OF (ICD-V45.81) He had previous bypass surgery and aortic valve replacement for aortic stenosis with a tissue valve. He's had no chest pain and this problem appears stable. He has not had an echocardiogram in 2 years we will plan to do that.  Problem # 2:  CONGESTIVE HEART FAILURE (ICD-428.0)  He has a long history of diastolic CHF. He appears euvolemic today and this problem appears stable. We will get laboratory studies today. His updated medication list for this problem includes:    Lasix 80 Mg Tabs (Furosemide) .Marland Kitchen... 2 tabs in the am    Spironolactone 25 Mg Tabs (Spironolactone) .Marland Kitchen... 2 by mouth once daily    Aspirin 81 Mg Tbec (Aspirin) .Marland Kitchen... Take one tablet by mouth daily    Lisinopril-hydrochlorothiazide 20-12.5 Mg Tabs (Lisinopril-hydrochlorothiazide) .Marland Kitchen... Take 1 tablet by mouth once a day    Warfarin Sodium 5 Mg Tabs (Warfarin sodium) ..... Use as directed by anticoagulation clinic  Orders: EKG w/ Interpretation (93000) Echocardiogram (Echo) TLB-BMP (Basic Metabolic Panel-BMET) (80048-METABOL) TLB-CBC Platelet - w/Differential (85025-CBCD) TLB-Lipid Panel (80061-LIPID) TLB-Hepatic/Liver Function Pnl (80076-HEPATIC)  His updated medication list for this problem includes:    Lasix 80 Mg  Tabs (Furosemide) .Marland Kitchen... 2 tabs in the am    Spironolactone 25 Mg Tabs (Spironolactone) .Marland Kitchen... 2 by mouth once daily    Aspirin 81 Mg Tbec (Aspirin) .Marland Kitchen... Take one tablet by mouth daily    Lisinopril-hydrochlorothiazide 20-12.5 Mg Tabs (Lisinopril-hydrochlorothiazide) .Marland Kitchen... Take 1 tablet by mouth once a day    Warfarin Sodium 5 Mg Tabs (Warfarin sodium) ..... Use as directed by anticoagulation clinic  Problem # 3:  PACEMAKER, PERMANENT (ICD-V45.01) He has chronic atrial fibrillation and has a VVI permanent pacemaker. His rate response is good. He is paced all the time. He has good thresholds. His longevity is about 6 years.  Problem # 4:  HYPERLIPIDEMIA (ICD-272.4)  We will check a fasting lipid and liver profile today. His updated medication list for this problem includes:    Vytorin 10-40 Mg Tabs (Ezetimibe-simvastatin) .Marland Kitchen... Take 1 tablet by mouth once a day  Orders: EKG w/ Interpretation (93000) Echocardiogram (Echo) TLB-BMP (Basic Metabolic Panel-BMET) (80048-METABOL) TLB-CBC Platelet - w/Differential (85025-CBCD) TLB-Lipid Panel (80061-LIPID) TLB-Hepatic/Liver Function Pnl (80076-HEPATIC)  His updated medication list for this problem includes:    Vytorin 10-40 Mg Tabs (Ezetimibe-simvastatin) .Marland Kitchen... Take 1 tablet by mouth once a day  Patient Instructions: 1)  Your physician recommends that you have lab work today: bmet/cbc/lipid/liver (414.01;272.2) 2)  Your physician wants you to follow-up in: 4 months.  You will receive a reminder letter in the mail two months in advance. If you don't receive a letter, please call our office to schedule the follow-up appointment. 3)  Your physician has requested that you have an echocardiogram.  Echocardiography is a painless test that uses sound waves to create images of your heart. It provides your doctor with information about the size and shape of your heart and how well your heart's chambers and valves are working.  This procedure takes  approximately one hour. There are no restrictions for this procedure.

## 2010-07-23 NOTE — Medication Information (Signed)
Summary: rov/eac  Anticoagulant Therapy  Managed by: Elaina Pattee, PharmD Referring MD: Charlies Constable MD PCP: Nolon Rod. Paz MD Supervising MD: Excell Seltzer MD, Casimiro Needle Indication 1: Atrial Fibrillation Lab Used: [facility] [place] Site: [address] INR POC 2.8 INR RANGE 2-3  Dietary changes: no    Health status changes: no    Bleeding/hemorrhagic complications: no    Recent/future hospitalizations: no    Any changes in medication regimen? no    Recent/future dental: no  Any missed doses?: no       Is patient compliant with meds? yes       Allergies: 1)  ! Toprol Xl 2)  ! * Iv Potassium 3)  Actos (Pioglitazone Hcl)  Anticoagulation Management History:      The patient is taking warfarin and comes in today for a routine follow up visit.  Positive risk factors for bleeding include an age of 37 years or older and presence of serious comorbidities.  The bleeding index is 'intermediate risk'.  Positive CHADS2 values include History of CHF, History of HTN, and History of Diabetes.  Negative CHADS2 values include Age > 13 years old.  The start date was 07/30/2004.  His last INR was 2.5 ratio.  Anticoagulation responsible provider: Excell Seltzer MD, Casimiro Needle.  INR POC: 2.8.  Cuvette Lot#: [id number].  Exp: 10/2010.    Anticoagulation Management Assessment/Plan:      The patient's current anticoagulation dose is Warfarin sodium 5 mg tabs: Use as directed by Anticoagulation Clinic.  The target INR is 2 - 3.  The next INR is due 10/30/2009.  Anticoagulation instructions were given to patient.  Results were reviewed/authorized by Elaina Pattee, PharmD.  He was notified by Elaina Pattee, PharmD.         Prior Anticoagulation Instructions: INR 2.6  Continue taking 1.5 tablets on Wednesday and 1 tablet all other days.  Return to clinic in 4 weeks.   Current Anticoagulation Instructions: INR 2.8. Take 1 tablet daily except 1.5 tablets Wednesday. Recheck in 4 weeks.

## 2010-07-23 NOTE — Progress Notes (Signed)
Summary: sample Lantus  Phone Note Call from Patient   Summary of Call: pt called , almost out of his Lantus and med assistance has form not processed yet, wanted to know can he get sample.  Sample ready for pickup Initial call taken by: Kandice Hams,  August 06, 2009 4:59 PM

## 2010-07-23 NOTE — Medication Information (Signed)
Summary: rov/sp  Anticoagulant Therapy  Managed by: Weston Brass, PharmD Referring MD: Charlies Constable MD PCP: Nolon Rod. Paz MD Supervising MD: Tenny Craw MD, Gunnar Fusi Indication 1: Atrial Fibrillation Lab Used: [facility] [place] Site: [address] INR POC 1.9 INR RANGE 2-3  Dietary changes: no    Health status changes: no    Bleeding/hemorrhagic complications: no    Recent/future hospitalizations: no    Any changes in medication regimen? no    Recent/future dental: no  Any missed doses?: no       Is patient compliant with meds? yes       Allergies: 1)  ! Toprol Xl 2)  ! * Iv Potassium 3)  Actos (Pioglitazone Hcl)  Anticoagulation Management History:      The patient is taking warfarin and comes in today for a routine follow up visit.  Positive risk factors for bleeding include an age of 38 years or older and presence of serious comorbidities.  The bleeding index is 'intermediate risk'.  Positive CHADS2 values include History of CHF, History of HTN, and History of Diabetes.  Negative CHADS2 values include Age > 60 years old.  The start date was 07/30/2004.  His last INR was 2.5 ratio.  Anticoagulation responsible provider: Tenny Craw MD, Gunnar Fusi.  INR POC: 1.9.  Cuvette Lot#: [id number].  Exp: 01/2011.    Anticoagulation Management Assessment/Plan:      The patient's current anticoagulation dose is Warfarin sodium 5 mg tabs: Use as directed by Anticoagulation Clinic.  The target INR is 2 - 3.  The next INR is due 12/25/2009.  Anticoagulation instructions were given to patient.  Results were reviewed/authorized by Weston Brass, PharmD.  He was notified by Weston Brass PharmD.         Prior Anticoagulation Instructions: INR 2.8  Continue same dose of 1 tablet every day except 1 1/2 tablets on Wednesday   Current Anticoagulation Instructions: INR 1.9  Take 1 1/2 tablets today then resume same dose of 1 tablet every day except 1 1/2 tablets on Wednesday.

## 2010-07-23 NOTE — Miscellaneous (Signed)
Summary: LABS FROM DR [facility]  Clinical Lists Changes  Observations: Added new observation of HGBA1C: 10.0 % (08/07/2008 12:51) Added new observation of CALCIUM: 9.2 mg/dL (69/62/9528 41:32) Added new observation of BG RANDOM: 229 mg/dL (44/06/270 53:66) Added new observation of CREATININE: 0.9 mg/dL (44/08/4740 59:56) Added new observation of BUN: 11 mg/dL (38/75/6433 29:51) Added new observation of CO2 TOTAL: 30 mmol/L (08/07/2008 12:51) Added new observation of CHLORIDE: 106 mmol/L (08/07/2008 12:51) Added new observation of POTASSIUM: 4.6 mmol/L (08/07/2008 12:51) Added new observation of SODIUM: 136 mmol/L (08/07/2008 12:51)      Chemistry Labs Test Date: 08/07/2008                      Value Units        H/L   Reference  Sodium:             136   mmol/L        L    (137-145) Potassium:          4.6   mmol/L             (3.6-5.0) Chloride:           106   mmol/L             (101-111) CO2:                30    mmol/L             (22-31) BUN:                11    mg/dL              (8-84) Creatinine:         0.9   mg/dL              (1.6-6.0) Glucose-random:     229   mg/dL         H    (63-016) Calcium (total):    9.2   mg/dL              (0-10.9)  Test ordered by:    MONICA DOERR,MD    Lipid Panel Test Date: 08/07/2008                        Value        Units        H/L   Reference  HgBA1c                10.0                             Test ordered by:      MONICA DOERR,MD

## 2010-07-23 NOTE — Miscellaneous (Signed)
Summary: labs from Dr. Roanna Raider  Clinical Lists Changes  Observations: Added new observation of HGBA1C: 8.7 % (02/23/2009 11:53) Added new observation of HGBA1C: 9.7 % (11/07/2008 11:53) Added new observation of CALCIUM: 9.2 mg/dL (16/03/9603 54:09) Added new observation of GLUCOSE SER: 229 mg/dL (81/19/1478 29:56) Added new observation of CREATININE: 0.9 mg/dL (21/30/8657 84:69) Added new observation of BUN: 11 mg/dL (62/95/2841 32:44) Added new observation of CO2 TOTAL: 30 mmol/L (08/07/2008 11:53) Added new observation of CHLORIDE: 106 mmol/L (08/07/2008 11:53) Added new observation of POTASSIUM: 4.6 mmol/L (08/07/2008 11:53) Added new observation of SODIUM: 136 mmol/L (08/07/2008 11:53) Added new observation of HGBA1C: 10.0 % (08/07/2008 11:53)

## 2010-07-23 NOTE — Letter (Signed)
Summary: [facility]-- (-) DM eye exam  [facility]   Imported By: Lanelle Bal 11/08/2009 13:04:59  _____________________________________________________________________  External Attachment:    Type:   Image     Comment:   External Document

## 2010-07-23 NOTE — Medication Information (Signed)
Summary: rov/jaj  Anticoagulant Therapy  Managed by: Weston Brass, PharmD Referring MD: Charlies Constable MD PCP: Nolon Rod. Paz MD Supervising MD: Myrtis Ser MD, Tinnie Gens Indication 1: Atrial Fibrillation Lab Used: [facility] [place] Site: [address] INR POC 3.0 INR RANGE 2-3  Dietary changes: no    Health status changes: no    Bleeding/hemorrhagic complications: no    Recent/future hospitalizations: no    Any changes in medication regimen? no    Recent/future dental: no  Any missed doses?: no       Is patient compliant with meds? yes       Allergies: 1)  ! Toprol Xl 2)  ! * Iv Potassium 3)  Actos (Pioglitazone Hcl)  Anticoagulation Management History:      The patient is taking warfarin and comes in today for a routine follow up visit.  Positive risk factors for bleeding include an age of 76 years or older and presence of serious comorbidities.  The bleeding index is 'intermediate risk'.  Positive CHADS2 values include History of CHF, History of HTN, and History of Diabetes.  Negative CHADS2 values include Age > 19 years old.  The start date was 07/30/2004.  His last INR was 2.5 ratio.  Anticoagulation responsible provider: Myrtis Ser MD, Tinnie Gens.  INR POC: 3.0.  Cuvette Lot#: [id number].  Exp: 04/2011.    Anticoagulation Management Assessment/Plan:      The patient's current anticoagulation dose is Warfarin sodium 5 mg tabs: Use as directed by Anticoagulation Clinic.  The target INR is 2 - 3.  The next INR is due 05/07/2010.  Anticoagulation instructions were given to patient.  Results were reviewed/authorized by Weston Brass, PharmD.  He was notified by Ilean Skill D candidate.         Prior Anticoagulation Instructions: INR 2.4  Continue taking 1 tablet everyday except take 2 tablets on Wednesdays. Re-check INR in 3 weeks.   Current Anticoagulation Instructions: INR 3.0  Continue taking 1 tablet everyday except 2 tablets on Wednesday. Recheck in 4 weeks.

## 2010-07-23 NOTE — Progress Notes (Signed)
Summary: ARE MEDS READY FOR PICK-UP  Phone Note Call from Patient Call back at Home Phone [phone]   Caller: Spouse Call For: [facility] E. Paz MD Reason for Call: Talk to Nurse Summary of Call: PATIENT SPOUSE, LINDA, CALLING, ASKING IF [employer] COMPANY HAS SHIPPED MEDS LANTUS & APRIDA TO [place] YET.  OK TO LEAVE MESSAGE MACHINE IF PT NOT HOME.  Initial call taken by: Magdalen Spatz [facility],  August 14, 2009 1:53 PM  Follow-up for Phone Call        advised wife that samples were not here yet Shary Decamp  August 14, 2009 2:40 PM

## 2010-07-23 NOTE — Assessment & Plan Note (Signed)
Summary: 2 mth fu/kdc   Vital Signs:  Patient profile:   76 year old male Height:      69 inches Weight:      262.4 pounds BMI:     38.89 Pulse rate:   72 / minute Pulse rhythm:   regular BP sitting:   120 / 80  (left arm) Cuff size:   large  Vitals Entered By: Shary Decamp (June 26, 2009 9:27 AM) CC: rov - not fasting Is Patient Diabetic? Yes   History of Present Illness: DIABETES-- good medication compliance with Lantus and Glucophage. Takes Apidra rarely and based on his postprandial sugar did not bring his CBG log update on he is eye  exams     PVD--note from cards 05-29-09 reviewed       Current Medications (verified): 1)  Lantus 100 Unit/ml Soln (Insulin Glargine) .... 85 Subcutaneously As Directed At Bedtime 2)  Glucophage Xr 500 Mg Tb24 (Metformin Hcl) .... Take 1 Tablet By Mouth Twice A Day 3)  Apidra 100 Unit/ml Soln (Insulin Glulisine) .... Sliding Scale 4)  Vytorin 10-40 Mg Tabs (Ezetimibe-Simvastatin) .... Take 1 Tablet By Mouth Once A Day 5)  Lasix 80 Mg Tabs (Furosemide) .... 2 Tabs in The Am 6)  Spironolactone 25 Mg  Tabs (Spironolactone) .... 2 By Mouth Once Daily 7)  Aspirin 81 Mg Tbec (Aspirin) .... Take One Tablet By Mouth Daily 8)  Lisinopril-Hydrochlorothiazide 20-12.5 Mg Tabs (Lisinopril-Hydrochlorothiazide) .... Take 1 Tablet By Mouth Once A Day 9)  Warfarin Sodium 5 Mg Tabs (Warfarin Sodium) .... Use As Directed By Anticoagulation Clinic 10)  Diazepam 2 Mg Tabs (Diazepam) .Marland Kitchen.. 1 By Mouth Two Times A Day 11)  Diabetic Shoes .... Dx----Diabetes Dx --- Pvd (Poor Circulation)  Allergies (verified): 1)  ! Toprol Xl 2)  ! * Iv Potassium 3)  Actos (Pioglitazone Hcl)  Comments:  Nurse/Medical Assistant: The patient's medications were reviewed with the patient and were updated in the Medication List. Shary Decamp (June 26, 2009 9:25 AM)  Past History:  Past Medical History:    DIABETES MELLITUS, TYPE II ----used to see Dr Roanna Raider, now f/u by  Dr Drue Novel    (+) retinopathy, cataracts (sees Nile Riggs and Rankin)    HTN    AORTIC VALVE REPLACEMENT (biophrostetic) for Ao Stenosis 2006    CABG 2006    ATRIAL FIBRILLATION , onset after CABG    CHF after CABG    Pacemaker 2-07 VVI    PVD :        08-2005: CT  angio showed-- vascular insuff, intestine and RAS bilaterally    angiogram 02-2009: severe PVD medical mangment      E.D.    OBESITY, MORBID      OBSTRUCTIVE SLEEP APNEA-- not using CPAP    NEPHROLITHIASIS, HX OF       Past Surgical History: Reviewed history from 04/24/2009 and no changes required. TONSILLECTOMY  CATARACT EXTRACTIONS, BILATERAL, HX OF (ICD-V45.61) * CARDIOVERSION PACEMAKER, PERMANENT (ICD-V45.01) AORTIC VALVE REPLACEMENT (biophrostetic) for Ao Stenosis 2006 CHF after CABG 2006  Social History: Reviewed history from 02/13/2009 and no changes required. 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 (70 pack years) No alcohol use No illicit drugs  Review of Systems       denies chest pain, shortness of breath. No lower extremity edema. He had a cold in December, all symptoms better except for a feeling of right ear clogged.  No ear discharge  or itching.  No fever  Physical Exam  General:  alert, well-developed, and well-nourished.   Ears:  R ear normal and L ear normal.   Lungs:  normal respiratory effort, no intercostal retractions, and no accessory muscle use.   Heart:  normal rate, regular rhythm, and no murmur.   Extremities:  trace lower extremity edema   Impression & Recommendations:  Problem # 1:  HYPERLIPIDEMIA (ICD-272.4) labs His updated medication list for this problem includes:    Vytorin 10-40 Mg Tabs (Ezetimibe-simvastatin) .Marland Kitchen... Take 1 tablet by mouth once a day  Labs Reviewed: SGOT: 25 (10/26/2006)   SGPT: 19 (10/26/2006)   HDL:31.7 (12/17/2007), 30.5 (10/26/2006)  LDL:77 (12/17/2007), 67 (10/26/2006)  Chol:132 (12/17/2007), 121  (10/26/2006)  Trig:117 (12/17/2007), 117 (10/26/2006)  Problem # 2:  DIABETES MELLITUS, TYPE II (ICD-250.00) patient continued to use Apidra  postprandially. Discussed  the need to use it preprandialy I recommended to check his CBG before and after meals,take Apidra 5 u w/ each meal and adjust the dosing depending on the results. If he decides to do that, he will come back in one month w/ his sugar log,otherwise OV in  3 months labs risk of uncontrolled diabetes  again discussed His updated medication list for this problem includes:    Lantus 100 Unit/ml Soln (Insulin glargine) .Marland KitchenMarland KitchenMarland KitchenMarland Kitchen 85 subcutaneously as directed at bedtime    Glucophage Xr 500 Mg Tb24 (Metformin hcl) .Marland Kitchen... Take 1 tablet by mouth twice a day    Apidra 100 Unit/ml Soln (Insulin glulisine) ..... Sliding scale    Aspirin 81 Mg Tbec (Aspirin) .Marland Kitchen... Take one tablet by mouth daily    Lisinopril-hydrochlorothiazide 20-12.5 Mg Tabs (Lisinopril-hydrochlorothiazide) .Marland Kitchen... Take 1 tablet by mouth once a day  Problem # 3:  HYPERTENSION (ICD-401.9) at goal  His updated medication list for this problem includes:    Lasix 80 Mg Tabs (Furosemide) .Marland Kitchen... 2 tabs in the am    Spironolactone 25 Mg Tabs (Spironolactone) .Marland Kitchen... 2 by mouth once daily    Lisinopril-hydrochlorothiazide 20-12.5 Mg Tabs (Lisinopril-hydrochlorothiazide) .Marland Kitchen... Take 1 tablet by mouth once a day  BP today: 120/80 Prior BP: 110/80 (05/29/2009)  Labs Reviewed: K+: 4.4 (04/23/2009) Creat: : 1.1 (04/23/2009)   Chol: 132 (12/17/2007)   HDL: 31.7 (12/17/2007)   LDL: 77 (12/17/2007)   TG: 117 (12/17/2007)  Problem # 4:   left ear feels clogged  exam normal, patient to call if symptoms persist  Problem # 5:  time spent more than 25 minutes, more than 50% of the time counseling about diabetes,see #2  Complete Medication List: 1)  Lantus 100 Unit/ml Soln (Insulin glargine) .... 85 subcutaneously as directed at bedtime 2)  Glucophage Xr 500 Mg Tb24 (Metformin hcl) .... Take  1 tablet by mouth twice a day 3)  Apidra 100 Unit/ml Soln (Insulin glulisine) .... Sliding scale 4)  Vytorin 10-40 Mg Tabs (Ezetimibe-simvastatin) .... Take 1 tablet by mouth once a day 5)  Lasix 80 Mg Tabs (Furosemide) .... 2 tabs in the am 6)  Spironolactone 25 Mg Tabs (Spironolactone) .... 2 by mouth once daily 7)  Aspirin 81 Mg Tbec (Aspirin) .... Take one tablet by mouth daily 8)  Lisinopril-hydrochlorothiazide 20-12.5 Mg Tabs (Lisinopril-hydrochlorothiazide) .... Take 1 tablet by mouth once a day 9)  Warfarin Sodium 5 Mg Tabs (Warfarin sodium) .... Use as directed by anticoagulation clinic 10)  Diazepam 2 Mg Tabs (Diazepam) .Marland Kitchen.. 1 by mouth two times a day 11)  Diabetic Shoes  .... Dx----diabetes dx --- pvd (  poor circulation)   Patient Instructions: 1)  came back fasting this week 2)  hemoglobin A1c, microalbumin ----dx diabetes. 3)  Fasting lipid profile, AST, ALT  ----dx hypercholesterol 4)  Please schedule a follow-up appointment in   months ( yearly exam)

## 2010-07-23 NOTE — Assessment & Plan Note (Signed)
Summary: yearly checkup---NS/KDC pt resch/nta   Vital Signs:  Patient profile:   76 year old male Height:      69 inches Weight:      264.6 pounds BMI:     39.22 Pulse rate:   64 / minute BP sitting:   120 / 80  Vitals Entered By: Shary Decamp (September 03, 2009 2:52 PM) CC: yearly Is Patient Diabetic? Yes   History of Present Illness:     DIABETES MELLITUS  during January & Feb. he took epidra 5 units before meals CBGs after breakfast 126 -- 94, after lunch 120s, after dinner in the 200s he stopped doing that in  March "I did not  know I had to continue"  retinopathy-- sees Dr Luciana Axe routunely  HTN-- checks ambulatory BPs rarely but is in the 120s   hyperlipidemia-- good medication compliance   ATRIAL FIBRILLATION -- CAD--CHF-- saw cards few months ago , stable    PVD-- sees cards, stable, last cards nore reviewed         Current Medications (verified): 1)  Lantus 100 Unit/ml Soln (Insulin Glargine) .... 85 Subcutaneously As Directed At Bedtime 2)  Glucophage Xr 500 Mg Tb24 (Metformin Hcl) .... Take 1 Tablet By Mouth Twice A Day 3)  Apidra 100 Unit/ml Soln (Insulin Glulisine) .... Sliding Scale 4)  Vytorin 10-40 Mg Tabs (Ezetimibe-Simvastatin) .... Take 1 Tablet By Mouth Once A Day 5)  Lasix 80 Mg Tabs (Furosemide) .... 2 Tabs in The Am 6)  Spironolactone 25 Mg  Tabs (Spironolactone) .... 2 By Mouth Once Daily 7)  Aspirin 81 Mg Tbec (Aspirin) .... Take One Tablet By Mouth Daily 8)  Lisinopril-Hydrochlorothiazide 20-12.5 Mg Tabs (Lisinopril-Hydrochlorothiazide) .... Take 1 Tablet By Mouth Once A Day 9)  Warfarin Sodium 5 Mg Tabs (Warfarin Sodium) .... Use As Directed By Anticoagulation Clinic 10)  Diazepam 2 Mg Tabs (Diazepam) .Marland Kitchen.. 1 By Mouth Two Times A Day 11)  Diabetic Shoes .... Dx----Diabetes Dx --- Pvd (Poor Circulation)  Allergies (verified): 1)  ! Toprol Xl 2)  ! * Iv Potassium 3)  Actos (Pioglitazone Hcl)  Past History:  Past Medical History:  DIABETES MELLITUS, TYPE II ----used to see Dr Roanna Raider, now f/u by Dr Drue Novel    (+) retinopathy, cataracts (sees Nile Riggs and Rankin)    HTN    AORTIC VALVE REPLACEMENT (biophrostetic) for Ao Stenosis 2006    CABG 2006    ATRIAL FIBRILLATION , onset after CABG    CHF after CABG    Pacemaker 2-07 VVI    PVD :        08-2005: CT  angio showed-- vascular insuff, intestine and RAS bilaterally    angiogram 02-2009: severe PVD medical mangment      E.D.    OBESITY, MORBID      OBSTRUCTIVE SLEEP APNEA-- not using CPAP    NEPHROLITHIASIS, HX OF       Past Surgical History: Reviewed history from 04/24/2009 and no changes required. TONSILLECTOMY  CATARACT EXTRACTIONS, BILATERAL, HX OF (ICD-V45.61) * CARDIOVERSION PACEMAKER, PERMANENT (ICD-V45.01) AORTIC VALVE REPLACEMENT (biophrostetic) for Ao Stenosis 2006 CHF after CABG 2006  Family History: Reviewed history from 08/16/2008 and no changes required. colon ca--no prostate ca--no Father:Died at age 28 Heart Attack Mother: Died at age 40 of a stroke Siblings: One sister died at age 85 heart problems, stomach aneurysm 3 other sisters living with heart problems and diabetes.  Social History: Reviewed history from 02/13/2009 and no changes required. 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 (70 pack years) No alcohol use No illicit drugs diet-- "trying to eat ok" exercise -- some , limited by pain   Review of Systems CV:  Denies chest pain or discomfort and swelling of feet. Resp:  Denies cough and shortness of breath. GI:  Denies bloody stools, nausea, and vomiting. GU:  Denies dysuria and hematuria.  Physical Exam  General:  alert, well-developed, and overweight-appearing.   Lungs:  normal respiratory effort, no intercostal retractions, and no accessory muscle use.   Heart:  normal rate, regular rhythm, and  II/ VI syst murmur.   Abdomen:  soft, non-tender, no distention, and no  masses.   Rectal:  No external abnormalities noted. Normal sphincter tone. No rectal masses or tenderness. Prostate:  Prostate gland firm and smooth, no enlargement, nodularity, tenderness, mass, asymmetry or induration. Extremities:  trace bilateral pretibial edema   Impression & Recommendations:  Problem # 1:  HEALTH SCREENING (ICD-V70.0) Td 2002 Pneumonia shot 2009 Cscope 12-2000 --polyps Cscope November 30 2003---small polyps next colonoscopy 2015  labs  Problem # 2:  CONGESTIVE HEART FAILURE (ICD-428.0) seems stable His updated medication list for this problem includes:    Lasix 80 Mg Tabs (Furosemide) .Marland Kitchen... 2 tabs in the am    Spironolactone 25 Mg Tabs (Spironolactone) .Marland Kitchen... 2 by mouth once daily    Aspirin 81 Mg Tbec (Aspirin) .Marland Kitchen... Take one tablet by mouth daily    Lisinopril-hydrochlorothiazide 20-12.5 Mg Tabs (Lisinopril-hydrochlorothiazide) .Marland Kitchen... Take 1 tablet by mouth once a day    Warfarin Sodium 5 Mg Tabs (Warfarin sodium) ..... Use as directed by anticoagulation clinic  Orders: TLB-BMP (Basic Metabolic Panel-BMET) (80048-METABOL)  Problem # 3:  HYPERLIPIDEMIA (ICD-272.4) well-controlled His updated medication list for this problem includes:    Vytorin 10-40 Mg Tabs (Ezetimibe-simvastatin) .Marland Kitchen... Take 1 tablet by mouth once a day    Labs Reviewed: SGOT: 25 (06/28/2009)   SGPT: 19 (06/28/2009)   HDL:32.30 (06/28/2009), 31.7 (12/17/2007)  LDL:65 (06/28/2009), 77 (12/17/2007)  Chol:120 (06/28/2009), 132 (12/17/2007)  Trig:114.0 (06/28/2009), 117 (12/17/2007)  Orders: TLB-TSH (Thyroid Stimulating Hormone) (84443-TSH)  Problem # 4:  HYPERTENSION (ICD-401.9) at goal  His updated medication list for this problem includes:    Lasix 80 Mg Tabs (Furosemide) .Marland Kitchen... 2 tabs in the am    Spironolactone 25 Mg Tabs (Spironolactone) .Marland Kitchen... 2 by mouth once daily    Lisinopril-hydrochlorothiazide 20-12.5 Mg Tabs (Lisinopril-hydrochlorothiazide) .Marland Kitchen... Take 1 tablet by mouth once a  day  BP today: 120/80 Prior BP: 120/80 (06/26/2009)  Labs Reviewed: K+: 4.4 (04/23/2009) Creat: : 1.1 (04/23/2009)   Chol: 120 (06/28/2009)   HDL: 32.30 (06/28/2009)   LDL: 65 (06/28/2009)   TG: 114.0 (06/28/2009)  Problem # 5:  DIABETES MELLITUS, TYPE II (ICD-250.00) see HPI, apparently was  doing better with Apidra but he discontinue it two weeks ago Plan: See instructions His updated medication list for this problem includes:    Lantus 100 Unit/ml Soln (Insulin glargine) .Marland KitchenMarland KitchenMarland KitchenMarland Kitchen 85 subcutaneously as directed at bedtime    Glucophage Xr 500 Mg Tb24 (Metformin hcl) .Marland Kitchen... Take 1 tablet by mouth twice a day    Apidra 100 Unit/ml Soln (Insulin glulisine) .Marland KitchenMarland KitchenMarland KitchenMarland Kitchen 5 units before breakfast, 5 units before lunch, 8 units before supper.    Aspirin 81 Mg Tbec (Aspirin) .Marland Kitchen... Take one tablet by mouth daily    Lisinopril-hydrochlorothiazide 20-12.5 Mg Tabs (Lisinopril-hydrochlorothiazide) .Marland Kitchen... Take 1 tablet by mouth once a day  Orders: Venipuncture (60454) TLB-A1C / Hgb  A1C (Glycohemoglobin) (83036-A1C) TLB-Microalbumin/Creat Ratio, Urine (82043-MALB) TLB-PSA (Prostate Specific Antigen) (84153-PSA)  Complete Medication List: 1)  Lantus 100 Unit/ml Soln (Insulin glargine) .... 85 subcutaneously as directed at bedtime 2)  Glucophage Xr 500 Mg Tb24 (Metformin hcl) .... Take 1 tablet by mouth twice a day 3)  Apidra 100 Unit/ml Soln (Insulin glulisine) .... 5 units before breakfast, 5 units before lunch, 8 units before supper. 4)  Vytorin 10-40 Mg Tabs (Ezetimibe-simvastatin) .... Take 1 tablet by mouth once a day 5)  Lasix 80 Mg Tabs (Furosemide) .... 2 tabs in the am 6)  Spironolactone 25 Mg Tabs (Spironolactone) .... 2 by mouth once daily 7)  Aspirin 81 Mg Tbec (Aspirin) .... Take one tablet by mouth daily 8)  Lisinopril-hydrochlorothiazide 20-12.5 Mg Tabs (Lisinopril-hydrochlorothiazide) .... Take 1 tablet by mouth once a day 9)  Warfarin Sodium 5 Mg Tabs (Warfarin sodium) .... Use as directed by  anticoagulation clinic 10)  Diazepam 2 Mg Tabs (Diazepam) .Marland Kitchen.. 1 by mouth two times a day 11)  Diabetic Shoes  .... Dx----diabetes dx --- pvd (poor circulation)  Patient Instructions: 1)  apidra 5 units before breakfast, 5 units before lunch, 8 units before supper.  2)   If you skip  any meal, skip apidra as well 3)  sugar goal after meals < 180 4)  call with sugar readings in a month 5)  Please schedule a follow-up appointment in 3 months .

## 2010-07-23 NOTE — Progress Notes (Signed)
Summary: calling re patient assistance form  Phone Note Call from Patient   Caller: Patient Reason for Call: Talk to Nurse Summary of Call: pt was here last week ,left form to fax to [employer] pt assistance-found out they don't accept faxes any longer, pls mail to [phone] [place] [phone] pt requesting call when we find out something from [facility] Initial call taken by: Glynda Jaeger,  January 08, 2010 12:13 PM  Follow-up for Phone Call        I spoke with pt who states he brought paperwork in when here for office visit with Dr. Juanda Chance. I told pt that Herbert Seta was out of office today and would be back tomorrow.  He would like Heather to call him with update on status of paperwork. Dossie Arbour, RN, BSN  January 08, 2010 12:31 PM  I called [employer] Patient Assistance today at [phone] and verified that the pt's application was recieved and just approved today. I have attempted to call the pt. I LMTC. Sherri Rad, RN, BSN  January 09, 2010 4:10 PM   I spoke with the pt and made him aware that he has been approved and the medication should come to his home. Follow-up by: Sherri Rad, RN, BSN,  January 10, 2010 9:34 AM

## 2010-07-23 NOTE — Medication Information (Signed)
Summary: rov/sel  Anticoagulant Therapy  Managed by: Bethena Midget, RN, BSN Referring MD: Charlies Constable MD PCP: Nolon Rod. Paz MD Supervising MD: Tenny Craw MD, Gunnar Fusi Indication 1: Atrial Fibrillation Lab Used: [facility] [place] Site: [address] INR POC 1.9 INR RANGE 2-3  Dietary changes: yes       Details: Ate extra leafy veggies  Health status changes: no    Bleeding/hemorrhagic complications: no    Recent/future hospitalizations: no    Any changes in medication regimen? no    Recent/future dental: no  Any missed doses?: no       Is patient compliant with meds? yes      Comments: Seeing Dr Juanda Chance today A  Allergies: 1)  ! Toprol Xl 2)  ! * Iv Potassium 3)  Actos (Pioglitazone Hcl)  Anticoagulation Management History:      The patient is taking warfarin and comes in today for a routine follow up visit.  Positive risk factors for bleeding include an age of 74 years or older and presence of serious comorbidities.  The bleeding index is 'intermediate risk'.  Positive CHADS2 values include History of CHF, History of HTN, and History of Diabetes.  Negative CHADS2 values include Age > 35 years old.  The start date was 07/30/2004.  His last INR was 2.5 ratio.  Anticoagulation responsible provider: Tenny Craw MD, Gunnar Fusi.  INR POC: 1.9.  Cuvette Lot#: [id number].  Exp: 03/2011.    Anticoagulation Management Assessment/Plan:      The patient's current anticoagulation dose is Warfarin sodium 5 mg tabs: Use as directed by Anticoagulation Clinic.  The target INR is 2 - 3.  The next INR is due 06/25/2010.  Anticoagulation instructions were given to patient.  Results were reviewed/authorized by Bethena Midget, RN, BSN.  He was notified by Bethena Midget, RN, BSN.         Prior Anticoagulation Instructions: INR 3.0  Continue taking 1 tablet everyday except 2 tablets on Wednesday. Recheck in 4 weeks.   Current Anticoagulation Instructions: INR 1.9 Today take 7.5mg s then resume 5mg s  everyday except 10mg s on Wednesdays. Recheck in 4 weeks.

## 2010-07-23 NOTE — Medication Information (Signed)
Summary: rov/ewj  Anticoagulant Therapy  Managed by: Weston Brass, PharmD Referring MD: Charlies Constable MD PCP: Nolon Rod. Paz MD Supervising MD: Excell Seltzer MD, Casimiro Needle Indication 1: Atrial Fibrillation Lab Used: [facility] [place] Site: [address] INR POC 2.4 INR RANGE 2-3  Dietary changes: yes       Details: Diet was irregular in the hospital, doesn't have much appetite  Health status changes: no    Bleeding/hemorrhagic complications: no    Recent/future hospitalizations: yes       Details: Went in hospital on 8/20 and was discharged on 9/2. 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 Pt was off of Coumadin the whole time he was in the hospital. Restarted Coumadin 2 days before discharge and resumed normal dose.   Any changes in medication regimen? no    Recent/future dental: no  Any missed doses?: no       Is patient compliant with meds? yes       Allergies: 1)  ! Toprol Xl 2)  ! * Iv Potassium 3)  Actos (Pioglitazone Hcl)  Anticoagulation Management History:      The patient is taking warfarin and comes in today for a routine follow up visit.  Positive risk factors for bleeding include an age of 9 years or older and presence of serious comorbidities.  The bleeding index is 'intermediate risk'.  Positive CHADS2 values include History of CHF, History of HTN, and History of Diabetes.  Negative CHADS2 values include Age > 43 years old.  The start date was 07/30/2004.  His last INR was 2.5 ratio.  Anticoagulation responsible provider: Excell Seltzer MD, Casimiro Needle.  INR POC: 2.4.  Cuvette Lot#: [id number].  Exp: 04/2011.    Anticoagulation Management Assessment/Plan:      The patient's current anticoagulation dose is Warfarin sodium 5 mg tabs: Use as directed by Anticoagulation Clinic.  The target INR is 2 - 3.  The next INR is due 04/08/2010.  Anticoagulation instructions were given to patient.  Results were reviewed/authorized by Weston Brass, PharmD.  He was notified by Harrel Carina, PharmD candidate.        Coagulation management information includes: Pt has been taking 1 tablet everyday except for 2 tablets on Wednesday instead of 1 1/2 on Wed and Sat like we had in our records. Updated our records to reflect this change. .  Prior Anticoagulation Instructions: INR 1.8  Today, Aug. 9, take 1.5 tablets.  Then begin taking 1 tablet daily except for 1.5 tablets Wed and Sat.  Return to clinic in 3 weeks.  Current Anticoagulation Instructions: INR 2.4  Continue taking 1 tablet everyday except take 2 tablets on Wednesdays. Re-check INR in 3 weeks.

## 2010-07-23 NOTE — Medication Information (Signed)
Summary: rogv/gm  Anticoagulant Therapy  Managed by: Lew Dawes, PharmD Candidate Referring MD: Charlies Constable MD PCP: Nolon Rod. Paz MD Supervising MD: Myrtis Ser MD, Tinnie Gens Indication 1: Atrial Fibrillation Lab Used: [facility] [place] Site: [address] INR POC 1.7 INR RANGE 2-3  Dietary changes: yes       Details: Patient eating less than usual due to stomach virus.  Health status changes: yes       Details: Patient has had a stomach virus for the past 2 weeks off and on.  Bleeding/hemorrhagic complications: no    Recent/future hospitalizations: no    Any changes in medication regimen? yes       Details: Patient has taken some prn Pepto-Bismol and loperamide for diarrhea.  Recent/future dental: no  Any missed doses?: yes     Details: Patient has missed multiple doses but is unsure of how many or when he missed his doses.  Is patient compliant with meds? yes       Allergies: 1)  ! Toprol Xl 2)  ! * Iv Potassium 3)  Actos (Pioglitazone Hcl)  Anticoagulation Management History:      The patient is taking warfarin and comes in today for a routine follow up visit.  Positive risk factors for bleeding include an age of 53 years or older and presence of serious comorbidities.  The bleeding index is 'intermediate risk'.  Positive CHADS2 values include History of CHF, History of HTN, and History of Diabetes.  Negative CHADS2 values include Age > 82 years old.  The start date was 07/30/2004.  His last INR was 2.5 ratio.  Anticoagulation responsible provider: Myrtis Ser MD, Tinnie Gens.  INR POC: 1.7.  Cuvette Lot#: [id number].  Exp: 09/2010.    Anticoagulation Management Assessment/Plan:      The patient's current anticoagulation dose is Warfarin sodium 5 mg tabs: Use as directed by Anticoagulation Clinic.  The target INR is 2 - 3.  The next INR is due 07/24/2009.  Anticoagulation instructions were given to patient.  Results were reviewed/authorized by Lew Dawes, PharmD Candidate.  He was  notified by Lew Dawes, PharmD Candidate.         Prior Anticoagulation Instructions: INR 1.5 Today and Wednesday take 1 1/2  pills then resume 1 pill everyday. Recheck in 2 weeks.   Current Anticoagulation Instructions: INR 1.7  Take 1.5 tablets today then resume 1 tablet daily. Recheck in 2 weeks.

## 2010-07-23 NOTE — Progress Notes (Signed)
Summary: question on meds  Phone Note Call from Patient Call back at Home Phone [phone]   Caller: Patient Reason for Call: Talk to Nurse Summary of Call: pt has question on meds.  Initial call taken by: Roe Coombs,  May 28, 2010 12:34 PM  Follow-up for Phone Call        I called and spoke with the pt. He states that he tried Lipitor before and he developed muscle problems with it. He wants to stay on Vytorin. I explained I will let Dr. Juanda Chance know, but that is ok.  Follow-up by: Sherri Rad, RN, BSN,  May 28, 2010 1:06 PM    New/Updated Medications: VYTORIN 10-40 MG TABS (EZETIMIBE-SIMVASTATIN) take one tablet by mouth once daily

## 2010-07-23 NOTE — Cardiovascular Report (Signed)
Summary: TTM   TTM   Imported By: Roderic Ovens 07/31/2009 16:00:24  _____________________________________________________________________  External Attachment:    Type:   Image     Comment:   External Document

## 2010-07-23 NOTE — Progress Notes (Signed)
Summary: diarrhea  Phone Note Outgoing Call Call back at [facility] Phone [phone]   Call placed by: Shary Decamp,  July 02, 2009 9:25 AM Summary of Call: Called pt to discuss lab results.  Patient states that he has had watery diarrhea (several times a day) for about 1 week now.  Had fever when sxs first started but afebrile now.  No other sxs.   Shary Decamp  July 02, 2009 9:26 AM   Follow-up for Phone Call        no recent antibiotics to my knowledge, on Glucophage for a while. Recommend patient to drink plenty of fluids, take Pepto-Bismol, if symptoms continue will needs either to be seen ( chief symptoms severe, fever)or collect stools  (Cx, WBCs , Csdiff---Dx diarrhea) Follow-up by: Elita Quick E. Paz MD,  July 02, 2009 9:32 AM  Additional Follow-up for Phone Call Additional follow up Details #1::        pt has been informed of Dr Drue Novel recommendations, pt denies fever .Kandice Hams  July 02, 2009 10:36 AM  Additional Follow-up by: Kandice Hams,  July 02, 2009 10:36 AM

## 2010-07-23 NOTE — Letter (Signed)
Summary: Results Follow up Letter  [facility] [place]  [address] [place], [place] [address]   Phone: [phone]  Fax: [phone]    07/02/2009 MRN: [id number]  [facility] [address] [place], [place]  [address]  Dear [name],   Attached is a copy of your lab results.  Your cholesterol is GREAT!!  However, your diabetes is not well controlled.  Dr. Drue Novel recommends taking the Apidra 5 units with each meal.  Please make sure you bring your blood sugar log with you when you come in for your physical on 07/31/09.  Feel free to call me if you have any questions.   Alena Bills [phone] ext [phone]

## 2010-07-23 NOTE — Progress Notes (Signed)
Summary: fax result- wound care center attenttion dr. Lurene Shadow  Phone Note Call from Patient Call back at Home Phone [phone] Call back at Work Phone [phone]   Caller: Patient  Reason for Call: Talk to Nurse Summary of Call: pls faxed test result over to wound care center attention dr. Lurene Shadow . fax # [phone] Initial call taken by: Lorne Skeens,  January 11, 2010 12:05 PM  Follow-up for Phone Call        I spoke with the pt. He needs his vascular studies faxed. I explained these have not been read yet.  We will f/u and fax when read. These studies were ordered by Dr. Clifton James. Follow-up by: Sherri Rad, RN, BSN,  January 11, 2010 2:04 PM  Additional Follow-up for Phone Call Additional follow up Details #1::        Spoke with pt and gave him results of carotid and lower arterial studies and let him know these would be faxed to Dr. Lurene Shadow. Additional Follow-up by: Dossie Arbour, RN, BSN,  January 22, 2010 5:39 PM

## 2010-07-23 NOTE — Medication Information (Signed)
Summary: Unable to Process Application/[employer]  Unable to Process Application/[employer]   Imported By: Lanelle Bal 09/03/2009 08:17:49  _____________________________________________________________________  External Attachment:    Type:   Image     Comment:   External Document

## 2010-07-23 NOTE — Letter (Signed)
Summary: Encounter Notice/[facility]  Encounter Notice/[facility]   Imported By: Lanelle Bal 03/07/2010 09:08:38  _____________________________________________________________________  External Attachment:    Type:   Image     Comment:   External Document

## 2010-07-23 NOTE — Assessment & Plan Note (Signed)
Summary: PVD/jml   Visit Type:  Follow-up Primary Provider:  Nolon Rod. Paz MD  CC:  Numbness in bilateral feet and unchanged.  History of Present Illness: 76 yo WM with history of severe PAD, DM, HTN, hyperlipidemia, CAD s/p CABG, obesity, chronic atrial fibrillation on coumadin therapy, s/p AVR with tissue valve here today for PV follow up. He is followed in our office by Dr. Juanda Chance for his cardiac issues. I saw him as a new PV consult in August 2010. He has over the last several years been having bilateral hip pain and right knee pain which is actually recently improved.  He has not been having calf pain with ambulation. His feet tingle constantly and his toes feel numb. His feet feel cold to him all of the time.  We did an angiogram in September 2010 that demonstrated severe infrapopliteal disease and moderate infrainguinal disease. He has occlusion of all three vessels below his knees in both legs with multiple collaterals that cannot be named (see below). He has recently had an ulcer on his left great toe and has had both toenails removed from the 1st and second toes on the left foot by Dr. Elvin So at [facility]. This is healing well. No claudication in calf muscles. We had planned on repeating his ABI here today but this was cancelled by pt. Recently seen by Dr. Elvin So in [facility] and had testing there for sensation and ? ABI with U/S.   The concern is currently for the ability to heal the wound on his left great toe. He thinks it looks much better than it had.    Current Medications (verified): 1)  Lantus 100 Unit/ml Soln (Insulin Glargine) .... 85 Subcutaneously As Directed At Bedtime 2)  Glucophage Xr 500 Mg Tb24 (Metformin Hcl) .... Take 1 Tablet in The Morning and 2 Tab With Lunch 3)  Apidra 100 Unit/ml Soln (Insulin Glulisine) .... 5 Units Before Breakfast,8 Units Before Lunch, 10  Units Before Supper. 4)  Vytorin 10-40 Mg Tabs (Ezetimibe-Simvastatin) .... Take 1 Tablet  By Mouth Once A Day 5)  Lasix 80 Mg Tabs (Furosemide) .... 2 Tabs in The Am 6)  Spironolactone 25 Mg  Tabs (Spironolactone) .... 2 By Mouth Once Daily 7)  Aspirin 81 Mg Tbec (Aspirin) .... Take One Tablet By Mouth Daily 8)  Lisinopril-Hydrochlorothiazide 20-12.5 Mg Tabs (Lisinopril-Hydrochlorothiazide) .... Take 1 Tablet By Mouth Once A Day 9)  Warfarin Sodium 5 Mg Tabs (Warfarin Sodium) .... Use As Directed By Anticoagulation Clinic 10)  Diazepam 2 Mg Tabs (Diazepam) .Marland Kitchen.. 1 By Mouth Two Times A Day 11)  Diabetic Shoes .... Dx----Diabetes Dx --- Pvd (Poor Circulation)  Allergies: 1)  ! Toprol Xl 2)  ! * Iv Potassium 3)  Actos (Pioglitazone Hcl)  Past History:  Past Medical History:    DIABETES MELLITUS, TYPE II ----used to see Dr Roanna Raider, now f/u by Dr Drue Novel    (+) retinopathy, cataracts (sees Nile Riggs and Rankin)    HTN    AORTIC VALVE REPLACEMENT (biophrostetic) for Ao Stenosis 2006    CABG 2006    ATRIAL FIBRILLATION , onset after CABG    CHF after CABG    Pacemaker 2-07 VVI    PVD :        08-2005: CT  angio showed-- vascular insuff, intestine and RAS bilaterally    angiogram 02-2009: severe PVD medical management      E.D.    OBESITY, MORBID      OBSTRUCTIVE  SLEEP APNEA-- not using CPAP    NEPHROLITHIASIS, HX OF       Social History: Reviewed history from 12/04/2009 and no changes required. 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 (70 pack years) No alcohol use No illicit drugs diet-- improved compared to last OV exercise -- some , limited by pain   Review of Systems       The patient complains of joint pain and skin lesions.  The patient denies fatigue, malaise, fever, weight gain/loss, vision loss, decreased hearing, hoarseness, chest pain, palpitations, shortness of breath, prolonged cough, wheezing, sleep apnea, coughing up blood, abdominal pain, blood in stool, nausea, vomiting, diarrhea, heartburn, incontinence, blood  in urine, muscle weakness, leg swelling, rash, headache, fainting, dizziness, depression, anxiety, enlarged lymph nodes, easy bruising or bleeding, and environmental allergies.         See HPI  Vital Signs:  Patient profile:   76 year old male Height:      69 inches Weight:      256 pounds BMI:     37.94 Pulse rate:   78 / minute Pulse rhythm:   regular BP sitting:   110 / 60  (left arm) Cuff size:   large  Vitals Entered By: Danielle Rankin, CMA (December 18, 2009 10:05 AM)  Physical Exam  General:  General:  General: Well developed, morbidly obese. NAD Psychiatric: Mood and affect normal Neck: No JVD, right  carotid bruits, no thyromegaly, no lymphadenopathy. Lungs:Clear bilaterally, no wheezes, rhonci, crackles CV: RRR no loud murmurs, gallops rubs Abdomen: soft, NT, ND, BS present Extremities: Trace bilateral lower ext edema, pulses difficult to palpate in DP/PT bilaterally. Left great toe and second toe are s/p toenail extraction. Medial aspect of left great toe with ulceration, appears to be granulating. No erythema. Both feet warm to touch.     Arteriogram-Lower Extremity  Procedure date:  03/09/2009  Findings:      ANGIOGRAPHIC FINDINGS: 1. The bilateral renal arteries have mild plaque disease. 2. The distal aorta has mild plaque disease, but no evidence of     aneurysm. 3. The right common iliac artery and external iliac arteries as well     as the right common femoral artery had mild plaque disease.  The     right internal iliac artery has 50% stenosis.  The right     superficial femoral artery has serial 30% lesions throughout the     proximal, mid, and distal portions of the vessel.  The right     popliteal artery is patent without any stenosis.  The right     anterior tibial artery has serial 99% stenoses throughout the     entire vessel.  The right peroneal artery is occluded distally.     The right posterior tibial artery is occluded in the midportion and      reconstitutes via collaterals.  There are multiple unnamed     collateral branches that supply blood to the foot. 4. The left common iliac artery, left external iliac artery, and left     common femoral artery have mild plaque disease.  The left internal     iliac artery has a 50% stenosis.  The left superficial femoral     artery has mild plaque noted throughout the proximal, mid, and     distal portions.  There is a discrete 50% stenosis noted in the     middle portion of the left superficial femoral artery.  The left     popliteal artery has plaque disease only.  The left anterior tibial     artery has 100% occlusion proximally.  The left peroneal artery has     100% occlusion in the distal portion.  The left posterior tibial     artery has 100% occlusion in the midportion and reconstitutes via     collaterals.  Blood supply to this was via multiple unnamed     collateral vessels.   IMPRESSION:  Peripheral vascular disease with severe disease in the infrapopliteal vessels in both legs.  PPM Specifications Following MD:  Everardo Beals. Juanda Chance, MD     PPM Vendor:  [employer]     PPM Model Number:  [id number]     PPM Serial Number:  [id number] Kathie Rhodes PPM DOI:  0206/2007     PPM Implanting MD:  Everardo Beals. Juanda Chance, MD  Lead 1    Location: RV     DOI: 07/29/2005     Model #: [id number]     Serial #: [id number]     Status: active  Magnet Response Rate:  BOL 85 ERI  65  Indications:  CHF; A-fib  Explantation Comments:  TTM's with [employer]  PPM Follow Up Pacer Dependent:  Yes      Episodes Coumadin:  Yes  Parameters Mode:  VVIR     Lower Rate Limit:  60     Upper Rate Limit:  120 Rate Response Parameters:  Threshold low.  Slope 7.  Impression & Recommendations:  Problem # 1:  PERIPHERAL VASCULAR DISEASE WITH CLAUDICATION (ICD-443.89) He is known to have severe infrapopliteal disease in both legs. He has done well over the last year until he recently has had the left great toenail extraced and has an ulcer on  that toe. This is being followed by Dr. Elvin So at [facility]. Mr. Johansson unfortunately has no good revascularization options as all three of his major arteries are occluded below the knee in both legs. His foot is currently being supplied by an extensive collateral network. I do not think there are any targets for potential bypass. Will repeat ABI bilaterally and if there is any change, can consider relook angiogram with potential for angioplasty on iliac or SFA disease to improve inflow below the knee. With his DM and severe PAD, I am concerned that he may develop ischemic symptoms in the future. He will continue ASA and coumadin and alert [place] of any change in clinical status. He is to see Dr. Elvin So soon for folllow up on the toe ulceration.   Will arrange follow up ABI bilaterally and carotid dopplers (right carotid bruit).   Other Orders: Arterial Duplex Lower Extremity (Arterial Duplex Low) Carotid Duplex (Carotid Duplex)  Patient Instructions: 1)  Your physician recommends that you schedule a follow-up appointment in: 6 months 2)  Your physician has requested that you have an ankle brachial index (ABI). During this test an ultrasound and blood pressure cuff are used to evaluate the arteries that supply the arms and legs with blood. Allow thirty minutes for this exam. There are no restrictions or special instructions. 3)  Your physician has requested that you have a carotid duplex. This test is an ultrasound of the carotid arteries in your neck. It looks at blood flow through these arteries that supply the brain with blood. Allow one hour for this exam. There are no restrictions or special instructions.  Appended Document: PVD/jml note faxed to Dr. Elvin So  --[phone]

## 2010-07-23 NOTE — Medication Information (Signed)
Summary: rov/ewj  Anticoagulant Therapy  Managed by: Eda Keys, PharmD Referring MD: Charlies Constable MD PCP: Nolon Rod. Paz MD Supervising MD: Myrtis Ser MD, Tinnie Gens Indication 1: Atrial Fibrillation Lab Used: [facility] [place] Site: [address] INR POC 2.6 INR RANGE 2-3  Dietary changes: no    Health status changes: no    Bleeding/hemorrhagic complications: no    Recent/future hospitalizations: no    Any changes in medication regimen? no    Recent/future dental: no  Any missed doses?: no       Is patient compliant with meds? yes       Allergies: 1)  ! Toprol Xl 2)  ! * Iv Potassium 3)  Actos (Pioglitazone Hcl)  Anticoagulation Management History:      The patient is taking warfarin and comes in today for a routine follow up visit.  Positive risk factors for bleeding include an age of 51 years or older and presence of serious comorbidities.  The bleeding index is 'intermediate risk'.  Positive CHADS2 values include History of CHF, History of HTN, and History of Diabetes.  Negative CHADS2 values include Age > 52 years old.  The start date was 07/30/2004.  His last INR was 2.5 ratio.  Anticoagulation responsible provider: Myrtis Ser MD, Tinnie Gens.  INR POC: 2.6.  Cuvette Lot#: [id number].  Exp: 10/2010.    Anticoagulation Management Assessment/Plan:      The patient's current anticoagulation dose is Warfarin sodium 5 mg tabs: Use as directed by Anticoagulation Clinic.  The target INR is 2 - 3.  The next INR is due 10/02/2009.  Anticoagulation instructions were given to patient.  Results were reviewed/authorized by Eda Keys, PharmD.  He was notified by Eda Keys.         Prior Anticoagulation Instructions: INR 2.5  Continue on same dosage 5mg  daily except 7.5mg  on Wednesdays.  Recheck in 3 weeks.    Current Anticoagulation Instructions: INR 2.6  Continue taking 1.5 tablets on Wednesday and 1 tablet all other days.  Return to clinic in 4 weeks.

## 2010-07-23 NOTE — Assessment & Plan Note (Signed)
Summary: 4 month rov/sl   Primary Provider:  Nolon Rod. Paz MD   History of Present Illness: The patient is 76 years old and return for management of CAD, atrial fibrillation, and CHF. He has had previous bypass surgery and aortic valve replacement with a tissue valve. He has chronic diastolic CHF and LVH. His ejection fraction is 60%. He also has chronic atrial fibrillation and has a VVI pacemaker.  He's been doing well with his heart but no symptoms of chest pain shortness of breath palpitations or swelling.  His major immediate problem is peripheral vascular disease. He has severe disease below the knee documented by Dr. Sanjuana Kava. He is being seen by Dr. Leola Brazil at the [place] [facility] for management of ulcers on his left foot. According to the patient Dr. Lisabeth Devoid is planning some nonsurgicalprocedure to help with the circulation.  Current Medications (verified): 1)  Lantus 100 Unit/ml Soln (Insulin Glargine) .... 85 Subcutaneously As Directed At Bedtime 2)  Glucophage Xr 500 Mg Tb24 (Metformin Hcl) .... Take 1 Tablet in The Morning and 2 Tab With Lunch 3)  Apidra 100 Unit/ml Soln (Insulin Glulisine) .... 5 Units Before Breakfast,8 Units Before Lunch, 10  Units Before Supper. 4)  Vytorin 10-40 Mg Tabs (Ezetimibe-Simvastatin) .... Take 1 Tablet By Mouth Once A Day 5)  Lasix 80 Mg Tabs (Furosemide) .... 2 Tabs in The Am 6)  Spironolactone 25 Mg  Tabs (Spironolactone) .... 2 By Mouth Once Daily 7)  Aspirin 81 Mg Tbec (Aspirin) .... Take One Tablet By Mouth Daily 8)  Lisinopril-Hydrochlorothiazide 20-12.5 Mg Tabs (Lisinopril-Hydrochlorothiazide) .... Take 1 Tablet By Mouth Once A Day 9)  Warfarin Sodium 5 Mg Tabs (Warfarin Sodium) .... Use As Directed By Anticoagulation Clinic 10)  Diazepam 2 Mg Tabs (Diazepam) .Marland Kitchen.. 1 By Mouth Two Times A Day 11)  Diabetic Shoes .... Dx----Diabetes Dx --- Pvd (Poor Circulation) 12)  Keflex 500 Mg Caps (Cephalexin) .Marland Kitchen.. 1 By Mouth Two Times A  Day  Allergies (verified): 1)  ! Toprol Xl 2)  ! * Iv Potassium 3)  Actos (Pioglitazone Hcl)  Past History:  Past Medical History: Reviewed history from 12/18/2009 and no changes required.    DIABETES MELLITUS, TYPE II ----used to see Dr Roanna Raider, now f/u by Dr Drue Novel    (+) retinopathy, cataracts (sees Nile Riggs and Rankin)    HTN    AORTIC VALVE REPLACEMENT (biophrostetic) for Ao Stenosis 2006    CABG 2006    ATRIAL FIBRILLATION , onset after CABG    CHF after CABG    Pacemaker 2-07 VVI    PVD :        08-2005: CT  angio showed-- vascular insuff, intestine and RAS bilaterally    angiogram 02-2009: severe PVD medical management      E.D.    OBESITY, MORBID      OBSTRUCTIVE SLEEP APNEA-- not using CPAP    NEPHROLITHIASIS, HX OF       Review of Systems       ROS is negative except as outlined in HPI.   Vital Signs:  Patient profile:   76 year old male Height:      69 inches Weight:      263 pounds BMI:     38.98 Pulse rate:   80 / minute Resp:     16 per minute BP sitting:   124 / 58  (right arm)  Vitals Entered By: Marrion Coy, CNA (December 31, 2009 10:26 AM)  Physical Exam  Additional Exam:  Gen. Well-nourished, in no distress   Neck: No JVD, thyroid not enlarged, no carotid bruits Lungs: No tachypnea, clear without rales, rhonchi or wheezes Cardiovascular: Rhythm regular, PMI not displaced,  heart sounds  normal, grade 2/6 systolic ejection murmur at the left sternal edge, trace bilateral peripheral edema, pulses difficult to feel in both feet Abdomen: BS normal, abdomen soft and non-tender without masses or organomegaly, no hepatosplenomegaly. MS: No deformities, no cyanosis or clubbing   Neuro:  No focal sns   Skin:  no lesions    PPM Specifications Following MD:  Everardo Beals. Juanda Chance, MD     PPM Vendor:  [employer]     PPM Model Number:  [id number]     PPM Serial Number:  [id number] Kathie Rhodes PPM DOI:  0206/2007     PPM Implanting MD:  Everardo Beals. Juanda Chance, MD  Lead 1    Location: RV      DOI: 07/29/2005     Model #: [id number]     Serial #: [id number]     Status: active  Magnet Response Rate:  BOL 85 ERI  65  Indications:  CHF; A-fib  Explantation Comments:  TTM's with [employer]  PPM Follow Up Pacer Dependent:  Yes      Episodes Coumadin:  Yes  Parameters Mode:  VVIR     Lower Rate Limit:  60     Upper Rate Limit:  120 Rate Response Parameters:  Threshold low.  Slope 7.  Impression & Recommendations:  Problem # 1:  CORONARY ARTERY BYPASS GRAFT, HX OF (ICD-V45.81) He has had previous bypass surgery and previous aortic valve replacement. He's had no recent chest pain and these problems appear stable.  Problem # 2:  ATRIAL FIBRILLATION (ICD-427.31) He has chronic atrial fibrillation which is managed with Coumadin and rate control. He also has a VVI pacemaker. His updated medication list for this problem includes:    Aspirin 81 Mg Tbec (Aspirin) .Marland Kitchen... Take one tablet by mouth daily    Warfarin Sodium 5 Mg Tabs (Warfarin sodium) ..... Use as directed by anticoagulation clinic  Problem # 3:  PACEMAKER, PERMANENT (ICD-V45.01) He has a VVI pacemaker and is pacing most all the time. His pacemaker was recently checked in March.  Problem # 4:  CONGESTIVE HEART FAILURE (ICD-428.0) He has a history of diastolic CHF. He appears euvolemic today. We will continue current therapy. His updated medication list for this problem includes:    Lasix 80 Mg Tabs (Furosemide) .Marland Kitchen... 2 tabs in the am    Spironolactone 25 Mg Tabs (Spironolactone) .Marland Kitchen... 2 by mouth once daily    Aspirin 81 Mg Tbec (Aspirin) .Marland Kitchen... Take one tablet by mouth daily    Lisinopril-hydrochlorothiazide 20-12.5 Mg Tabs (Lisinopril-hydrochlorothiazide) .Marland Kitchen... Take 1 tablet by mouth once a day    Warfarin Sodium 5 Mg Tabs (Warfarin sodium) ..... Use as directed by anticoagulation clinic  Orders: EKG w/ Interpretation (93000) TLB-CBC Platelet - w/Differential (85025-CBCD) TLB-BMP (Basic Metabolic Panel-BMET)  (80048-METABOL)  Problem # 5:  CLAUDICATION, INTERMITTENT (ICD-443.9) He has severe peripheral vascular disease which is not suitable for revascularization. He has been managed by the wound Center at [place] long period the patient says Dr. Lisabeth Devoid plans some new therapy to help improve the circulation in his legs and will try to discuss that with him.  Patient Instructions: 1)  Your physician recommends that you schedule a follow-up appointment in: 4 months with Dr. Juanda Chance 2)  Your physician recommends that you have  lab work  today: BMP, CBC 3)  Your physician recommends that you continue on your current medications as directed. Please refer to the Current Medication list given to you today.

## 2010-07-23 NOTE — Assessment & Plan Note (Signed)
Summary: per check out   Visit Type:  Follow-up Primary Provider:  Nolon Rod. Paz MD  CC:  ROV.  History of Present Illness: The patient is 76 years old and return for management of CAD, atrial fibrillation, and CHF. He has had previous bypass surgery and aortic valve replacement with a tissue valve. He has chronic diastolic CHF and LVH. His ejection fraction is 60%. He also has chronic atrial fibrillation and has a VVI pacemaker.  He's been doing well with his heart but no symptoms of chest pain shortness of breath palpitations or swelling.  He has had pain in his right posterior chest worse with movement x 2 mos.  His major immediate problem is peripheral vascular disease. He has severe disease below the knee documented by Dr. Sanjuana Kava. He recently had his toe removed by Dr Myra Gianotti.  Current Medications (verified): 1)  Lantus 100 Unit/ml Soln (Insulin Glargine) .... 55 Subcutaneously  At Bedtime 2)  Glucophage Xr 500 Mg Tb24 (Metformin Hcl) .... Take 1 Tablet in The Morning and 1 Tablet in The Evening 3)  Apidra 100 Unit/ml Soln (Insulin Glulisine) .... 5 Units Before Breakfast, 10 Units Before Lunch, 12   Units Before Supper. 4)  Vytorin 10-40 Mg Tabs (Ezetimibe-Simvastatin) .... Take 1 Tablet By Mouth Once A Day 5)  Lasix 80 Mg Tabs (Furosemide) .... 2 Tabs in The Am 6)  Spironolactone 25 Mg  Tabs (Spironolactone) .... Take 1 By Mouth Once Daily 7)  Aspirin 325 Mg Tabs (Aspirin) .Marland Kitchen.. 1 By Mouth Daily. 8)  Lisinopril-Hydrochlorothiazide 20-12.5 Mg Tabs (Lisinopril-Hydrochlorothiazide) .... Take 1 Tablet By Mouth Once A Day 9)  Warfarin Sodium 5 Mg Tabs (Warfarin Sodium) .... Use As Directed By Anticoagulation Clinic 10)  Diabetic Shoes .... Dx----Diabetes Dx --- Pvd (Poor Circulation) 11)  Percocet 5-325 Mg Tabs (Oxycodone-Acetaminophen) .Marland Kitchen.. 1 By Mouth Q6hrs As Needed 12)  Mvi  Allergies: 1)  ! Toprol Xl 2)  ! * Iv Potassium 3)  Actos (Pioglitazone Hcl)  Past History:  Past  Surgical History: Last updated: 04/04/2010 TONSILLECTOMY  CATARACT EXTRACTIONS, BILATERAL, HX OF (ICD-V45.61) * CARDIOVERSION PACEMAKER, PERMANENT (ICD-V45.01) AORTIC VALVE REPLACEMENT (biophrostetic) for Ao Stenosis 2006 CABG 2006 Left great toe amputation  01-2010  Family History: Last updated: 08/16/2008 colon ca--no prostate ca--no Father:Died at age 30 Heart Attack Mother: Died at age 17 of a stroke Siblings: One sister died at age 73 heart problems, stomach aneurysm 3 other sisters living with heart problems and diabetes.  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 (70 pack years) No alcohol use No illicit drugs diet-- improved compared to last OV exercise -- some , limited by pain   Risk Factors: Smoking Status: quit (12/17/2007)  Past Medical History: Reviewed history from 04/04/2010 and no changes required.    DIABETES MELLITUS, TYPE II ----used to see Dr Roanna Raider, now f/u by Dr Drue Novel    (+) retinopathy, cataracts (sees Nile Riggs and Rankin)    HTN    AORTIC VALVE REPLACEMENT (biophrostetic) for Ao Stenosis 2006    CABG 2006    ATRIAL FIBRILLATION , onset after CABG------Coumadin management at [facility]madin clinic    CHF after CABG    Pacemaker 2-07 VVI    PVD :        08-2005: CT  angio showed-- vascular insuff, intestine and RAS bilaterally    angiogram 02-2009: severe PVD medical management      E.D.  OBESITY, MORBID      OBSTRUCTIVE SLEEP APNEA-- not using CPAP    NEPHROLITHIASIS, HX OF       Review of Systems       ROS is negative except as outlined in HPI.   Vital Signs:  Patient profile:   76 year old male Height:      68 inches Weight:      252.25 pounds BMI:     38.49 Pulse rate:   66 / minute BP sitting:   110 / 60  (left arm) Cuff size:   large  Vitals Entered By: Stanton Kidney, EMT-P (May 27, 2010 2:45 PM)  Physical Exam  Additional Exam:  Gen.  Well-nourished, in no distress   Neck: No JVD, thyroid not enlarged, no carotid bruits Lungs: No tachypnea, clear without rales, rhonchi or wheezes Cardiovascular: Rhythm regular, PMI not displaced,  heart sounds  normal, no murmurs or gallops, no peripheral edema, pulses normal in all 4 extremities. Abdomen: BS normal, abdomen soft and non-tender without masses or organomegaly, no hepatosplenomegaly. MS: No deformities, no cyanosis or clubbing   Neuro:  No focal sns   Skin:  no lesions    PPM Specifications Following MD:  Everardo Beals. Juanda Chance, MD     PPM Vendor:  [employer]     PPM Model Number:  [id number]     PPM Serial Number:  [id number] Kathie Rhodes PPM DOI:  0206/2007     PPM Implanting MD:  Everardo Beals. Juanda Chance, MD  Lead 1    Location: RV     DOI: 07/29/2005     Model #: [id number]     Serial #: [id number]     Status: active  Magnet Response Rate:  BOL 85 ERI  65  Indications:  CHF; A-fib  Explantation Comments:  TTM's with [employer]  PPM Follow Up Remote Check?  No Battery Voltage:  2.78 V     Battery Est. Longevity:  5 years     Pacer Dependent:  Yes     Right Ventricle  Impedance: 669 ohms, Threshold: 1.0 V at 0.6 msec  Episodes Coumadin:  Yes Ventricular High Rate:  52      Parameters Mode:  VVIR     Lower Rate Limit:  60     Upper Rate Limit:  120 Rate Response Parameters:  Threshold low.  Slope 7. Next Cardiology Appt Due:  11/22/2010 Tech Comments:  No parameter changes.  Device function normal.  52 VHR episodes 2-6 seconds in length.  ROV 6 months clinic. Altha Harm, LPN  May 27, 2010 3:37 PM   Impression & Recommendations:  Problem # 1:  CORONARY ARTERY BYPASS GRAFT, HX OF (ICD-V45.81) S/P CABG and AVR with tissue valve.  No CP. Stable. Orders: EKG w/ Interpretation (93000)  Problem # 2:  PACEMAKER, PERMANENT (ICD-V45.01) Has VVI pacer with CAF.  Stable  Problem # 3:  ATRIAL FIBRILLATION (ICD-427.31) Has CAF managed with warfarin. His updated medication list for this problem  includes:    Aspirin 325 Mg Tabs (Aspirin) .Marland Kitchen... 1 by mouth daily.    Warfarin Sodium 5 Mg Tabs (Warfarin sodium) ..... Use as directed by anticoagulation clinic  Problem # 4:  PERIPHERAL VASCULAR DISEASE WITH CLAUDICATION (ICD-443.89) He has sig PVD and has had a toe removed.  No recent symptoms.  Problem # 5:  CONGESTIVE HEART FAILURE (ICD-428.0) Has chronic CHF diastolic.   Euvolemic today. His updated medication list for this problem includes:    Lasix 80 Mg  Tabs (Furosemide) .Marland Kitchen... 2 tabs in the am    Spironolactone 25 Mg Tabs (Spironolactone) .Marland Kitchen... Take 1 by mouth once daily    Aspirin 325 Mg Tabs (Aspirin) .Marland Kitchen... 1 by mouth daily.    Lisinopril-hydrochlorothiazide 20-12.5 Mg Tabs (Lisinopril-hydrochlorothiazide) .Marland Kitchen... Take 1 tablet by mouth once a day    Warfarin Sodium 5 Mg Tabs (Warfarin sodium) ..... Use as directed by anticoagulation clinic  Problem # 6:  CHEST PAIN-UNSPECIFIED (ICD-786.50) He has right posterior CP.  Probably MS.  Check CXR. His updated medication list for this problem includes:    Aspirin 325 Mg Tabs (Aspirin) .Marland Kitchen... 1 by mouth daily.    Lisinopril-hydrochlorothiazide 20-12.5 Mg Tabs (Lisinopril-hydrochlorothiazide) .Marland Kitchen... Take 1 tablet by mouth once a day    Warfarin Sodium 5 Mg Tabs (Warfarin sodium) ..... Use as directed by anticoagulation clinic  Other Orders: T-2 View CXR (71020TC)  Patient Instructions: 1)  Finish your current dose of Vytorin, then start Lipitor 40mg  once daily. 2)  4 weeks after starting Lipitor, you will need to come for FASTING labwork: lipid/liver/bmet (414.01). 3)  Your physician wants you to follow-up in: 4 months with Dr. Shirlee Latch.  You will receive a reminder letter in the mail two months in advance. If you don't receive a letter, please call our office to schedule the follow-up appointment. 4)  A chest x-ray takes a picture of the organs and structures inside the chest, including the heart, lungs, and blood vessels. This test can  show several things, including, whether the heart is enlarged; whether fluid is building up in the lungs; and whether pacemaker / defibrillator leads are still in place. Prescriptions: LIPITOR 40 MG TABS (ATORVASTATIN CALCIUM) take one tablet by mouth once daily  #30 x 6   Entered by:   Sherri Rad, RN, BSN   Authorized by:   Lenoria Farrier, MD, [facility]   Signed by:   Sherri Rad, RN, BSN on 05/27/2010   Method used:   Electronically to        [id number] (retail)       [address]       [place], [place]  [address]       Ph: [phone] or [phone]       Fax: [phone]   RxID:   [id number]

## 2010-07-23 NOTE — Medication Information (Signed)
Summary: rov/cb  Anticoagulant Therapy  Managed by: Weston Brass, PharmD Referring MD: Charlies Constable MD PCP: Nolon Rod. Paz MD Supervising MD: Eden Emms MD, Theron Arista Indication 1: Atrial Fibrillation Lab Used: [facility] [place] Site: [address] INR POC 2.8 INR RANGE 2-3  Dietary changes: no    Health status changes: no    Bleeding/hemorrhagic complications: no    Recent/future hospitalizations: no    Any changes in medication regimen? no    Recent/future dental: no  Any missed doses?: no       Is patient compliant with meds? yes       Allergies: 1)  ! Toprol Xl 2)  ! * Iv Potassium 3)  Actos (Pioglitazone Hcl)  Anticoagulation Management History:      The patient is taking warfarin and comes in today for a routine follow up visit.  Positive risk factors for bleeding include an age of 76 years or older and presence of serious comorbidities.  The bleeding index is 'intermediate risk'.  Positive CHADS2 values include History of CHF, History of HTN, and History of Diabetes.  Negative CHADS2 values include Age > 6 years old.  The start date was 07/30/2004.  His last INR was 2.5 ratio.  Anticoagulation responsible provider: Eden Emms MD, Theron Arista.  INR POC: 2.8.  Cuvette Lot#: [id number].  Exp: 01/2011.    Anticoagulation Management Assessment/Plan:      The patient's current anticoagulation dose is Warfarin sodium 5 mg tabs: Use as directed by Anticoagulation Clinic.  The target INR is 2 - 3.  The next INR is due 11/27/2009.  Anticoagulation instructions were given to patient.  Results were reviewed/authorized by Weston Brass, PharmD.  He was notified by Weston Brass PharmD.         Prior Anticoagulation Instructions: INR 2.8. Take 1 tablet daily except 1.5 tablets Wednesday. Recheck in 4 weeks.  Current Anticoagulation Instructions: INR 2.8  Continue same dose of 1 tablet every day except 1 1/2 tablets on Wednesday

## 2010-07-23 NOTE — Letter (Signed)
Summary: Encounter Notice/[facility]  Encounter [facility]   Imported By: Lanelle Bal 02/21/2010 10:44:07  _____________________________________________________________________  External Attachment:    Type:   Image     Comment:   External Document

## 2010-07-23 NOTE — Medication Information (Signed)
Summary: rov/tm  Anticoagulant Therapy  Managed by: Bethena Midget, RN, BSN Referring MD: Charlies Constable MD PCP: Nolon Rod. Paz MD Supervising MD: Juanda Chance MD, Yolonda Purtle Indication 1: Atrial Fibrillation Lab Used: [facility] [place] Site: [address] INR POC 1.5 INR RANGE 2-3  Dietary changes: no    Health status changes: yes       Details: had cold, had some blood in mucous  Bleeding/hemorrhagic complications: no    Recent/future hospitalizations: no    Any changes in medication regimen? no    Recent/future dental: no  Any missed doses?: yes     Details: missed a dose last week.  Is patient compliant with meds? yes       Allergies: 1)  ! Toprol Xl 2)  ! * Iv Potassium 3)  Actos (Pioglitazone Hcl)  Anticoagulation Management History:      The patient is taking warfarin and comes in today for a routine follow up visit.  Positive risk factors for bleeding include an age of 76 years or older and presence of serious comorbidities.  The bleeding index is 'intermediate risk'.  Positive CHADS2 values include History of CHF, History of HTN, and History of Diabetes.  Negative CHADS2 values include Age > 18 years old.  The start date was 07/30/2004.  His last INR was 2.5 ratio.  Anticoagulation responsible provider: Juanda Chance MD, Smitty Cords.  INR POC: 1.5.  Cuvette Lot#: [id number].  Exp: 07/2010.    Anticoagulation Management Assessment/Plan:      The patient's current anticoagulation dose is Warfarin sodium 5 mg tabs: Use as directed by Anticoagulation Clinic.  The target INR is 2 - 3.  The next INR is due 07/10/2009.  Anticoagulation instructions were given to patient.  Results were reviewed/authorized by Bethena Midget, RN, BSN.  He was notified by Shelby Dubin PharmD, BCPS, CPP.         Prior Anticoagulation Instructions: INR 2.2 Continue 5mg s everyday. Recheck in 4 weeks.   Current Anticoagulation Instructions: INR 1.5 Today and Wednesday take 1 1/2  pills then resume 1 pill everyday.  Recheck in 2 weeks.

## 2010-07-23 NOTE — Assessment & Plan Note (Signed)
Summary: 1 MONTH FOLLOWUP///SPH   Vital Signs:  Patient profile:   76 year old male Weight:      245.38 pounds Pulse rate:   94 / minute Pulse rhythm:   regular BP sitting:   128 / 84  (left arm) Cuff size:   regular  Vitals Entered By: Army Fossa CMA (April 04, 2010 10:20 AM) CC: Pt here to f/u- not fasting Comments Pharm- [facility]   History of Present Illness: routine office visit CBGs in the mornings are usually in the 120s to 140s CBGs at 8 PM, often times after lunch are higher in the 250-170 range  Current Medications (verified): 1)  Lantus 100 Unit/ml Soln (Insulin Glargine) .... 50 Subcutaneously As Directed At Bedtime 2)  Glucophage Xr 500 Mg Tb24 (Metformin Hcl) .... Take 1 Tablet in The Morning and 2 Tab With Lunch 3)  Apidra 100 Unit/ml Soln (Insulin Glulisine) .... 5 Units Before Breakfast,8 Units Before Lunch, 10  Units Before Supper. 4)  Vytorin 10-40 Mg Tabs (Ezetimibe-Simvastatin) .... Take 1 Tablet By Mouth Once A Day 5)  Lasix 80 Mg Tabs (Furosemide) .... 2 Tabs in The Am 6)  Spironolactone 25 Mg  Tabs (Spironolactone) .... 2 By Mouth Once Daily 7)  Aspirin 325 Mg Tabs (Aspirin) .Marland Kitchen.. 1 By Mouth Daily. 8)  Lisinopril-Hydrochlorothiazide 20-12.5 Mg Tabs (Lisinopril-Hydrochlorothiazide) .... Take 1 Tablet By Mouth Once A Day 9)  Warfarin Sodium 5 Mg Tabs (Warfarin Sodium) .... Use As Directed By Anticoagulation Clinic 10)  Diabetic Shoes .... Dx----Diabetes Dx --- Pvd (Poor Circulation) 11)  Percocet 5-325 Mg Tabs (Oxycodone-Acetaminophen) .Marland Kitchen.. 1 By Mouth Q6hrs As Needed 12)  Mvi  Allergies (verified): 1)  ! Toprol Xl 2)  ! * Iv Potassium 3)  Actos (Pioglitazone Hcl)  Past History:  Past Medical History:    DIABETES MELLITUS, TYPE II ----used to see Dr Roanna Raider, now f/u by Dr Drue Novel    (+) retinopathy, cataracts (sees Nile Riggs and Rankin)    HTN    AORTIC VALVE REPLACEMENT (biophrostetic) for Ao Stenosis 2006    CABG 2006    ATRIAL  FIBRILLATION , onset after CABG------Coumadin management at Coumadin clinic    CHF after CABG    Pacemaker 2-07 VVI    PVD :        08-2005: CT  angio showed-- vascular insuff, intestine and RAS bilaterally    angiogram 02-2009: severe PVD medical management      E.D.    OBESITY, MORBID      OBSTRUCTIVE SLEEP APNEA-- not using CPAP    NEPHROLITHIASIS, HX OF       Past Surgical History: TONSILLECTOMY  CATARACT EXTRACTIONS, BILATERAL, HX OF (ICD-V45.61) * CARDIOVERSION PACEMAKER, PERMANENT (ICD-V45.01) AORTIC VALVE REPLACEMENT (biophrostetic) for Ao Stenosis 2006 CABG 2006 Left great toe amputation  01-2010  Social History: Reviewed history from 12/04/2009 and no changes required. 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 (70 pack years) No alcohol use No illicit drugs diet-- improved compared to last OV exercise -- some , limited by pain   Review of Systems CV:  Denies chest pain or discomfort and swelling of feet. Resp:  Denies cough and shortness of breath. Endo:  no low sugar symptoms .  Physical Exam  General:  alert and well-developed.   Lungs:  normal respiratory effort, no intercostal retractions, and no accessory muscle use.   Heart:  normal rate, regular rhythm, and  II/ VI syst murmur.  Extremities:  no pretibial edema   Impression & Recommendations:  Problem # 1:  HYPERTENSION (ICD-401.9) at goal  His updated medication list for this problem includes:    Lasix 80 Mg Tabs (Furosemide) .Marland Kitchen... 2 tabs in the am    Spironolactone 25 Mg Tabs (Spironolactone) .Marland Kitchen... 2 by mouth once daily    Lisinopril-hydrochlorothiazide 20-12.5 Mg Tabs (Lisinopril-hydrochlorothiazide) .Marland Kitchen... Take 1 tablet by mouth once a day  BP today: 128/84 Prior BP: 130/82 (03/05/2010)  Labs Reviewed: K+: 4.3 (12/31/2009) Creat: : 1.1 (12/31/2009)   Chol: 122 (09/04/2009)   HDL: 39.30 (09/04/2009)   LDL: 56 (09/04/2009)   TG: 132.0  (09/04/2009)  Problem # 2:  DIABETES MELLITUS, TYPE II (ICD-250.00) blood sugars are ok but there is  room for improvement Increase Lantus from 50  to 55 units CBGs in the afternoon are higher than in the morning, increase apidra  from 10-28-08  to 10-31-10 see instructions low CBG symptoms discussed   His updated medication list for this problem includes:    Lantus 100 Unit/ml Soln (Insulin glargine) .Marland KitchenMarland KitchenMarland KitchenMarland Kitchen 55 subcutaneously  at bedtime    Glucophage Xr 500 Mg Tb24 (Metformin hcl) .Marland Kitchen... Take 1 tablet in the morning and 2 tab with lunch    Apidra 100 Unit/ml Soln (Insulin glulisine) .Marland KitchenMarland KitchenMarland KitchenMarland Kitchen 5 units before breakfast, 10 units before lunch, 12   units before supper.    Aspirin 325 Mg Tabs (Aspirin) .Marland Kitchen... 1 by mouth daily.    Lisinopril-hydrochlorothiazide 20-12.5 Mg Tabs (Lisinopril-hydrochlorothiazide) .Marland Kitchen... Take 1 tablet by mouth once a day  Orders: Venipuncture (16109) TLB-A1C / Hgb A1C (Glycohemoglobin) (83036-A1C) Specimen Handling (60454)  Complete Medication List: 1)  Lantus 100 Unit/ml Soln (Insulin glargine) .... 55 subcutaneously  at bedtime 2)  Glucophage Xr 500 Mg Tb24 (Metformin hcl) .... Take 1 tablet in the morning and 2 tab with lunch 3)  Apidra 100 Unit/ml Soln (Insulin glulisine) .... 5 units before breakfast, 10 units before lunch, 12   units before supper. 4)  Vytorin 10-40 Mg Tabs (Ezetimibe-simvastatin) .... Take 1 tablet by mouth once a day 5)  Lasix 80 Mg Tabs (Furosemide) .... 2 tabs in the am 6)  Spironolactone 25 Mg Tabs (Spironolactone) .... 2 by mouth once daily 7)  Aspirin 325 Mg Tabs (Aspirin) .Marland Kitchen.. 1 by mouth daily. 8)  Lisinopril-hydrochlorothiazide 20-12.5 Mg Tabs (Lisinopril-hydrochlorothiazide) .... Take 1 tablet by mouth once a day 9)  Warfarin Sodium 5 Mg Tabs (Warfarin sodium) .... Use as directed by anticoagulation clinic 10)  Diabetic Shoes  .... Dx----diabetes dx --- pvd (poor circulation) 11)  Percocet 5-325 Mg Tabs (Oxycodone-acetaminophen) .Marland Kitchen.. 1 by  mouth q6hrs as needed 12)  Mvi   Patient Instructions: 1)  Increase Lantus from 50  to 55 units 2)  increase apidra  from  10-28-08   to   10-31-10 3)  cold with CBGs in 2 weeks 4)  Watch for low sugar symptoms 5)  came  back in 3 months

## 2010-07-23 NOTE — Letter (Signed)
Summary: [facility]  [facility]   Imported By: Debby Freiberg 12/20/2009 16:08:52  _____________________________________________________________________  External Attachment:    Type:   Image     Comment:   External Document

## 2010-07-23 NOTE — Cardiovascular Report (Signed)
Summary: TTM   TTM   Imported By: Roderic Ovens 03/21/2010 16:07:56  _____________________________________________________________________  External Attachment:    Type:   Image     Comment:   External Document

## 2010-07-23 NOTE — Progress Notes (Signed)
Summary: lab result  Phone Note Outgoing Call   Summary of Call: DISCUSS WITH PATIENT letter mailed , rx sent to pharmacy.Marti Sleigh Deloach CMA  December 06, 2009 10:18 AM     New/Updated Medications: GLUCOPHAGE XR 500 MG TB24 (METFORMIN HCL) Take 1 tablet in the morning and 2 tab with lunch Prescriptions: GLUCOPHAGE XR 500 MG TB24 (METFORMIN HCL) Take 1 tablet in the morning and 2 tab with lunch  #90 x 2   Entered by:   Jeremy Johann CMA   Authorized by:   Nolon Rod. Paz MD   Signed by:   Jeremy Johann CMA on 12/06/2009   Method used:   Faxed to ...       [facility]  [address] [id number]* (retail)       [address]       [place], [place]  [address]       Ph: [phone] or [phone]       Fax: [phone]   RxID:   [id number]

## 2010-07-23 NOTE — Letter (Signed)
Summary: [facility] OV  [facility]   Imported By: Debby Freiberg 12/20/2009 16:10:37  _____________________________________________________________________  External Attachment:    Type:   Image     Comment:   External Document

## 2010-07-23 NOTE — Miscellaneous (Signed)
Summary: LABS FROM [facility]  Clinical Lists Changes  Observations: Added new observation of HGBA1C: 8.7 % (02/23/2009 12:53)      Lipid Panel Test Date: 02/23/2009                        Value        Units        H/L   Reference  HgBA1c                8.7                              Test ordered by:      MONICA DOERR,MD

## 2010-07-23 NOTE — Assessment & Plan Note (Signed)
Summary: 3 month followup/kn   Vital Signs:  Patient profile:   76 year old male Weight:      237 pounds Temp:     97.4 degrees F oral Pulse rate:   100 / minute Pulse rhythm:   regular BP sitting:   130 / 82  (left arm) Cuff size:   large  Vitals Entered By: Army Fossa CMA (March 05, 2010 11:42 AM) CC: Hospital f/u. not fasting Comments flu shot [facility] [address]   History of Present Illness: Hospital followup DATE OF ADMISSION:  02/10/2010   DATE OF DISCHARGE:  02/22/2010   1. Cellulitis and gangrene of the left great toe, status post   amputation and status post left superficial femoral artery and left tibial   stenting.   last hemoglobin 13.4 Labs creatinine 1.2, potassium 3.8.  Current Medications (verified): 1)  Lantus 100 Unit/ml Soln (Insulin Glargine) .... 25 Subcutaneously As Directed At Bedtime 2)  Glucophage Xr 500 Mg Tb24 (Metformin Hcl) .... Take 1 Tablet in The Morning and 2 Tab With Lunch 3)  Apidra 100 Unit/ml Soln (Insulin Glulisine) .... 5 Units Before Breakfast,8 Units Before Lunch, 10  Units Before Supper. 4)  Vytorin 10-40 Mg Tabs (Ezetimibe-Simvastatin) .... Take 1 Tablet By Mouth Once A Day 5)  Lasix 80 Mg Tabs (Furosemide) .... 2 Tabs in The Am 6)  Spironolactone 25 Mg  Tabs (Spironolactone) .... 2 By Mouth Once Daily 7)  Aspirin 325 Mg Tabs (Aspirin) .Marland Kitchen.. 1 By Mouth Daily. 8)  Lisinopril-Hydrochlorothiazide 20-12.5 Mg Tabs (Lisinopril-Hydrochlorothiazide) .... Take 1 Tablet By Mouth Once A Day 9)  Warfarin Sodium 5 Mg Tabs (Warfarin Sodium) .... Use As Directed By Anticoagulation Clinic 10)  Diazepam 2 Mg Tabs (Diazepam) .Marland Kitchen.. 1 By Mouth Two Times A Day 11)  Diabetic Shoes .... Dx----Diabetes Dx --- Pvd (Poor Circulation) 12)  Doxycycline Hyclate 100 Mg Solr (Doxycycline Hyclate) .Marland Kitchen.. 1 By Mouth Two Times A Day 13)  Percocet 5-325 Mg Tabs (Oxycodone-Acetaminophen) .Marland Kitchen.. 1 By Mouth Q6hrs As Needed  Allergies: 1)  ! Toprol Xl 2)  ! * Iv  Potassium 3)  Actos (Pioglitazone Hcl)  Past History:  Past Surgical History: TONSILLECTOMY  CATARACT EXTRACTIONS, BILATERAL, HX OF (ICD-V45.61) * CARDIOVERSION PACEMAKER, PERMANENT (ICD-V45.01) AORTIC VALVE REPLACEMENT (biophrostetic) for Ao Stenosis 2006 CHF after CABG 2006 Left great toe amputation  01-2010  Social History: Reviewed history from 12/04/2009 and no changes required. 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 (70 pack years) No alcohol use No illicit drugs diet-- improved compared to last OV exercise -- some , limited by pain   Review of Systems        since he left the hospital, the wound on the toe is feeling well He understood he was supposed to discontinue Lantus, he CBGs were very high, up to 300, he increased his apidra doses. Patient states that he'll a phone call 3 days after the discharge from the hospital, they recommend him to discontinue Plavix, take a full dose aspirin and continue with Coumadin   Physical Exam  General:  alert and well-developed.   Lungs:  normal respiratory effort, no intercostal retractions, and no accessory muscle use.   Heart:  normal rate, regular rhythm, and  II/ VI syst murmur.   Extremities:  no pretibial edema   Impression & Recommendations:  Problem # 1:  amputation status post amputation of  left great toe  to finish her his antibiotics  in 3 days  Problem # 2:  DIABETES MELLITUS, TYPE II (ICD-250.00) according to the discharge summary dictation, he was supposed to continue with Lantus, patient did not. Plan: Go back to lantus  was taking up to 85 u but he has changed his diet and lost wt see instructions  apidra as before  His updated medication list for this problem includes:    Lantus 100 Unit/ml Soln (Insulin glargine) .Marland KitchenMarland KitchenMarland KitchenMarland Kitchen 50 subcutaneously as directed at bedtime    Glucophage Xr 500 Mg Tb24 (Metformin hcl) .Marland Kitchen... Take 1 tablet in the morning and 2 tab  with lunch    Apidra 100 Unit/ml Soln (Insulin glulisine) .Marland KitchenMarland KitchenMarland KitchenMarland Kitchen 5 units before breakfast,8 units before lunch, 10  units before supper.    Aspirin 325 Mg Tabs (Aspirin) .Marland Kitchen... 1 by mouth daily.    Lisinopril-hydrochlorothiazide 20-12.5 Mg Tabs (Lisinopril-hydrochlorothiazide) .Marland Kitchen... Take 1 tablet by mouth once a day  Problem # 3:  ATRIAL FIBRILLATION (ICD-427.31) according to the discharge summary dictation, he was supposed to be on Plavix, aspirin 81 and Coumadin. Patient reports he got a phone call   3 days after the discharge instructed him to increase the aspirin and discontinue Plavix (per surgery, apparently b/c he was unable to afford plavix) His updated medication list for this problem includes:    Aspirin 325 Mg Tabs (Aspirin) .Marland Kitchen... 1 by mouth daily.    Warfarin Sodium 5 Mg Tabs (Warfarin sodium) ..... Use as directed by anticoagulation clinic  Problem # 4:  time spent more than 25 minutes due to  extensive chart review and clarification of his medicines  Complete Medication List: 1)  Lantus 100 Unit/ml Soln (Insulin glargine) .... 50 subcutaneously as directed at bedtime 2)  Glucophage Xr 500 Mg Tb24 (Metformin hcl) .... Take 1 tablet in the morning and 2 tab with lunch 3)  Apidra 100 Unit/ml Soln (Insulin glulisine) .... 5 units before breakfast,8 units before lunch, 10  units before supper. 4)  Vytorin 10-40 Mg Tabs (Ezetimibe-simvastatin) .... Take 1 tablet by mouth once a day 5)  Lasix 80 Mg Tabs (Furosemide) .... 2 tabs in the am 6)  Spironolactone 25 Mg Tabs (Spironolactone) .... 2 by mouth once daily 7)  Aspirin 325 Mg Tabs (Aspirin) .Marland Kitchen.. 1 by mouth daily. 8)  Lisinopril-hydrochlorothiazide 20-12.5 Mg Tabs (Lisinopril-hydrochlorothiazide) .... Take 1 tablet by mouth once a day 9)  Warfarin Sodium 5 Mg Tabs (Warfarin sodium) .... Use as directed by anticoagulation clinic 10)  Diazepam 2 Mg Tabs (Diazepam) .Marland Kitchen.. 1 by mouth two times a day 11)  Diabetic Shoes  .... Dx----diabetes  dx --- pvd (poor circulation) 12)  Doxycycline Hyclate 100 Mg Solr (Doxycycline hyclate) .Marland Kitchen.. 1 by mouth two times a day 13)  Percocet 5-325 Mg Tabs (Oxycodone-acetaminophen) .Marland Kitchen.. 1 by mouth q6hrs as needed  Other Orders: Flu Vaccine 65yrs + MEDICARE PATIENTS (Z6109) Administration Flu vaccine - MCR (U0454) Flu Vaccine Consent Questions     Do you have a history of severe allergic reactions to this vaccine? no    Any prior history of allergic reactions to egg and/or gelatin? no    Do you have a sensitivity to the preservative Thimersol? no    Do you have a past history of Guillan-Barre Syndrome? no    Do you currently have an acute febrile illness? no    Have you ever had a severe reaction to latex? no    Vaccine information given and explained to patient? yes    Are you currently pregnant?  no    Lot Number:[id number]   Exp Date:12/21/2010   Site Given  Left Deltoid IM  Patient Instructions: 1)  lantus 50 units a day 2)  continue with Apidra 3)  call with your sugar readings in 1 week 4)  Please schedule a follow-up appointment in 1 month.    .lbmedflu

## 2010-07-23 NOTE — Progress Notes (Signed)
Summary: NEEDS HOSPITAL DISCHARGE PAPERS BACK  Phone Note Call from Patient Call back at Home Phone [phone]   Caller: Patient Summary of Call: PATIENT LEFT TWO HOSPITAL DISCHARGE PAPERS WITH DR Kayleb Warshaw TODAY FOR DR Zoanne Newill  TO REVIEW---  PATIENT WOULD LIKE [place] TO MAKE A COPY FOR OUR RECORDS AND MAIL THE ORIGINALS BACK TO HIM--CONFIRMED HIS ADDRESS IN EMR AS CORRECT Initial call taken by: Jerolyn Shin,  March 05, 2010 4:30 PM  Follow-up for Phone Call        please check his medication list , note    discrepancies and mail it back to the patient Follow-up by: Shamere Dilworth E. Tryton Bodi MD,  March 11, 2010 3:51 PM  Additional Follow-up for Phone Call Additional follow up Details #1::        Checked med list, I added plavix and he is taking 25 units of Lantus. Army Fossa CMA,  March 11, 2010 4:25 PM  addendum, he actually is not taking Plavix due to cost He is taking Lantus 50 units. See office visit. Olanna Percifield E. Danyal Whitenack MD  March 12, 2010 8:17 AM     New/Updated Medications: LANTUS 100 UNIT/ML SOLN (INSULIN GLARGINE) 50 subcutaneously as directed at bedtime * MVI

## 2010-07-23 NOTE — Medication Information (Signed)
Summary: rov/tm  Anticoagulant Therapy  Managed by: Cloyde Reams, RN, BSN Referring MD: Charlies Constable MD PCP: Nolon Rod. Paz MD Supervising MD: Jens Som MD, Arlys John Indication 1: Atrial Fibrillation Lab Used: [facility]  Site: [address] INR POC 2.5 INR RANGE 2-3  Dietary changes: no    Health status changes: no    Bleeding/hemorrhagic complications: no    Recent/future hospitalizations: no    Any changes in medication regimen? no    Recent/future dental: no  Any missed doses?: no       Is patient compliant with meds? yes       Allergies (verified): 1)  ! Toprol Xl 2)  ! * Iv Potassium 3)  Actos (Pioglitazone Hcl)  Anticoagulation Management History:      The patient is taking warfarin and comes in today for a routine follow up visit.  Positive risk factors for bleeding include an age of 76 years or older and presence of serious comorbidities.  The bleeding index is 'intermediate risk'.  Positive CHADS2 values include History of CHF, History of HTN, and History of Diabetes.  Negative CHADS2 values include Age > 45 years old.  The start date was 07/30/2004.  His last INR was 2.5 ratio.  Anticoagulation responsible provider: Jens Som MD, Arlys John.  INR POC: 2.5.  Cuvette Lot#: [id number].  Exp: 10/2010.    Anticoagulation Management Assessment/Plan:      The patient's current anticoagulation dose is Warfarin sodium 5 mg tabs: Use as directed by Anticoagulation Clinic.  The target INR is 2 - 3.  The next INR is due 09/13/2009.  Anticoagulation instructions were given to patient.  Results were reviewed/authorized by Cloyde Reams, RN, BSN.  He was notified by Cloyde Reams RN.         Prior Anticoagulation Instructions: INR 1.7 Today take 7.5mg s then Change dose to 5mg s everyday except 7.5mg s on Wednesdays. Recheck in 2 weeks.   Current Anticoagulation Instructions: INR 2.5  Continue on same dosage 5mg  daily except 7.5mg  on Wednesdays.  Recheck in 3 weeks.

## 2010-07-23 NOTE — Medication Information (Signed)
Summary: ccr  Anticoagulant Therapy  Managed by: Bethena Midget, RN, BSN Referring MD: Charlies Constable MD PCP: Nolon Rod. Paz MD Supervising MD: Myrtis Ser MD, Tinnie Gens Indication 1: Atrial Fibrillation Lab Used: [facility] [place] Site: [address] INR POC 1.7 INR RANGE 2-3  Dietary changes: no    Health status changes: no    Bleeding/hemorrhagic complications: no    Recent/future hospitalizations: no    Any changes in medication regimen? no    Recent/future dental: no  Any missed doses?: no       Is patient compliant with meds? yes       Allergies: 1)  ! Toprol Xl 2)  ! * Iv Potassium 3)  Actos (Pioglitazone Hcl)  Anticoagulation Management History:      The patient is taking warfarin and comes in today for a routine follow up visit.  Positive risk factors for bleeding include an age of 76 years or older and presence of serious comorbidities.  The bleeding index is 'intermediate risk'.  Positive CHADS2 values include History of CHF, History of HTN, and History of Diabetes.  Negative CHADS2 values include Age > 34 years old.  The start date was 07/30/2004.  His last INR was 2.5 ratio.  Anticoagulation responsible provider: Myrtis Ser MD, Tinnie Gens.  INR POC: 1.7.  Cuvette Lot#: [id number].  Exp: 09/2010.    Anticoagulation Management Assessment/Plan:      The patient's current anticoagulation dose is Warfarin sodium 5 mg tabs: Use as directed by Anticoagulation Clinic.  The target INR is 2 - 3.  The next INR is due 08/21/2009.  Anticoagulation instructions were given to patient.  Results were reviewed/authorized by Bethena Midget, RN, BSN.  He was notified by Bethena Midget, RN, BSN.         Prior Anticoagulation Instructions: INR 1.7  Take 1.5 tablets today then resume 1 tablet daily. Recheck in 2 weeks.  Current Anticoagulation Instructions: INR 1.7 Today take 7.5mg s then Change dose to 5mg s everyday except 7.5mg s on Wednesdays. Recheck in 2 weeks.

## 2010-07-23 NOTE — Miscellaneous (Signed)
Summary: LABS FROM DR [facility]  Clinical Lists Changes  Observations: Added new observation of HGBA1C: 9.7 % (11/07/2008 12:50)      Lipid Panel Test Date: 11/07/2008                        Value        Units        H/L   Reference  HgBA1c                9.7                              Test ordered by:      Ocie Cornfield, MD

## 2010-07-23 NOTE — Medication Information (Signed)
Summary: Patient Assistance Form/[employer]  Patient Assistance Form/[employer]   Imported By: Lanelle Bal 07/31/2009 09:35:15  _____________________________________________________________________  External Attachment:    Type:   Image     Comment:   External Document

## 2010-07-23 NOTE — Medication Information (Signed)
Summary: rov/sp  Anticoagulant Therapy  Managed by: Cloyde Reams, RN, BSN Referring MD: Charlies Constable MD PCP: Nolon Rod. Paz MD Supervising MD: Tenny Craw MD, Gunnar Fusi Indication 1: Atrial Fibrillation Lab Used: [facility] [place] Site: [address] INR POC 2.0 INR RANGE 2-3  Dietary changes: no    Health status changes: no    Bleeding/hemorrhagic complications: no    Recent/future hospitalizations: no    Any changes in medication regimen? yes       Details: Pt currently on abx for 2 toenails removed started last week.  Also taking Vicodin prn.  Recent/future dental: no  Any missed doses?: no       Is patient compliant with meds? yes       Allergies: 1)  ! Toprol Xl 2)  ! * Iv Potassium 3)  Actos (Pioglitazone Hcl)  Anticoagulation Management History:      The patient is taking warfarin and comes in today for a routine follow up visit.  Positive risk factors for bleeding include an age of 76 years or older and presence of serious comorbidities.  The bleeding index is 'intermediate risk'.  Positive CHADS2 values include History of CHF, History of HTN, and History of Diabetes.  Negative CHADS2 values include Age > 74 years old.  The start date was 07/30/2004.  His last INR was 2.5 ratio.  Anticoagulation responsible provider: Tenny Craw MD, Gunnar Fusi.  INR POC: 2.0.  Cuvette Lot#: [id number].  Exp: 02/2011.    Anticoagulation Management Assessment/Plan:      The patient's current anticoagulation dose is Warfarin sodium 5 mg tabs: Use as directed by Anticoagulation Clinic.  The target INR is 2 - 3.  The next INR is due 01/28/2010.  Anticoagulation instructions were given to patient.  Results were reviewed/authorized by Cloyde Reams, RN, BSN.  He was notified by Cloyde Reams RN.         Prior Anticoagulation Instructions: INR 1.9  Take 1 1/2 tablets today then resume same dose of 1 tablet every day except 1 1/2 tablets on Wednesday.   Current Anticoagulation Instructions: INR  2.0  Take 7.5mg  today, then resume same dosage 5mg  daily except 7.5mg  on Wednesdays.  Recheck in 4 weeks.

## 2010-07-23 NOTE — Assessment & Plan Note (Signed)
Summary: 3 MONTH OV//PH   Vital Signs:  Patient profile:   76 year old male Height:      69 inches Weight:      259 pounds Temp:     97.7 degrees F oral Pulse rate:   80 / minute BP sitting:   140 / 70  (left arm)  Vitals Entered By: Jeremy Johann CMA (December 04, 2009 8:35 AM) [facility]: 3 month f/u DM Comments --fasting --rx diabetic shoes REVIEWED MED LIST, PATIENT AGREED DOSE AND INSTRUCTION CORRECT    History of Present Illness: ROV s/p removal of 1-2nd toe nails  **L** by podiatrisy last week  DIABETES --  CBGs in the morning are 80 to, 98, 139. CBGs in the afternoon are higher around 160 with occasional 200 and very seldom 100    (+) retinopathy-- saw Dr Luciana Axe 2 weeks ago, stable      HTN-- ambulatory BPs good    Cardiac-- status post evaluation by cardiology 3 months ago, stable echo reviewed, good EF, + LVH, aortic valve looks well         Allergies: 1)  ! Toprol Xl 2)  ! * Iv Potassium 3)  Actos (Pioglitazone Hcl)  Past History:  Past Medical History:    DIABETES MELLITUS, TYPE II ----used to see Dr Roanna Raider, now f/u by Dr Drue Novel    (+) retinopathy, cataracts (sees [facility])    HTN    AORTIC VALVE REPLACEMENT (biophrostetic) for Ao Stenosis 2006    CABG 2006    ATRIAL FIBRILLATION , onset after CABG    CHF after CABG    Pacemaker 2-07 VVI    PVD :        08-2005: CT  angio showed-- vascular insuff, intestine and RAS bilaterally    angiogram 02-2009: severe PVD medical mangment      E.D.    OBESITY, MORBID      OBSTRUCTIVE SLEEP APNEA-- not using CPAP    NEPHROLITHIASIS, HX OF       Past Surgical History: Reviewed history from 04/24/2009 and no changes required. 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 (70 pack years) No alcohol use No illicit drugs diet-- improved compared to last OV exercise -- some , limited by pain   Review of Systems CV:  Denies chest pain or discomfort and swelling of feet. Resp:  Denies cough, shortness of breath, and wheezing. GU:  Denies dysuria, hematuria, urinary frequency, and urinary hesitancy.  Physical Exam  General:  alert, well-developed, and overweight-appearing but  lost 6 pounds since the  last visit  Lungs:  normal respiratory effort, no intercostal retractions, and no accessory muscle use.   Heart:  normal rate, regular rhythm, and  II/ VI syst murmur.   Extremities:  no edema   Impression & Recommendations:  Problem # 1:  AORTIC STENOSIS, SEVERE (ICD-424.1) status post Ao replacement 2006, last echocardiogram 4-11 showed  good results His updated medication list for this problem includes:    Aspirin 81 Mg Tbec (Aspirin) .Marland Kitchen... Take one tablet by mouth daily    Warfarin Sodium 5 Mg Tabs (Warfarin sodium) ..... Use as directed by anticoagulation clinic  Problem # 2:  HYPERLIPIDEMIA (ICD-272.4) at goal  His updated medication list for this problem includes:  Vytorin 10-40 Mg Tabs (Ezetimibe-simvastatin) .Marland Kitchen... Take 1 tablet by mouth once a day  Labs Reviewed: SGOT: 22 (09/04/2009)   SGPT: 19 (09/04/2009)   HDL:39.30 (09/04/2009), 32.30 (06/28/2009)  LDL:56 (09/04/2009), 65 (06/28/2009)  Chol:122 (09/04/2009), 120 (06/28/2009)  Trig:132.0 (09/04/2009), 114.0 (06/28/2009)  Problem # 3:  ATRIAL FIBRILLATION (ICD-427.31) on Coumadin His updated medication list for this problem includes:    Aspirin 81 Mg Tbec (Aspirin) .Marland Kitchen... Take one tablet by mouth daily    Warfarin Sodium 5 Mg Tabs (Warfarin sodium) ..... Use as directed by anticoagulation clinic  Problem # 4:  HYPERTENSION (ICD-401.9) no change His updated medication list for this problem includes:    Lasix 80 Mg Tabs (Furosemide) .Marland Kitchen... 2 tabs in the  am    Spironolactone 25 Mg Tabs (Spironolactone) .Marland Kitchen... 2 by mouth once daily    Lisinopril-hydrochlorothiazide 20-12.5 Mg Tabs (Lisinopril-hydrochlorothiazide) .Marland Kitchen... Take 1 tablet by mouth once a day  BP today: 140/70 Prior BP: 135/68 (09/04/2009)  Labs Reviewed: K+: 4.1 (09/04/2009) Creat: : 1.0 (09/04/2009)   Chol: 122 (09/04/2009)   HDL: 39.30 (09/04/2009)   LDL: 56 (09/04/2009)   TG: 132.0 (09/04/2009)  Problem # 5:  DIABETES MELLITUS, TYPE II (ICD-250.00) CBGs still elevated in the afternoon The patient is skipping apidra if CBG are  less than 100 recommend to go ahead and use apidra even if the CBG  is less than 100 as long as he is about to eat. Increased dose of apidra to 8 before lunch and 10 andbefore dinner   His updated medication list for this problem includes:    Lantus 100 Unit/ml Soln (Insulin glargine) .Marland KitchenMarland KitchenMarland KitchenMarland Kitchen 85 subcutaneously as directed at bedtime    Glucophage Xr 500 Mg Tb24 (Metformin hcl) .Marland Kitchen... Take 1 tablet by mouth twice a day    Apidra 100 Unit/ml Soln (Insulin glulisine) .Marland KitchenMarland KitchenMarland KitchenMarland Kitchen 5 units before breakfast,8 units before lunch, 10  units before supper.    Aspirin 81 Mg Tbec (Aspirin) .Marland Kitchen... Take one tablet by mouth daily    Lisinopril-hydrochlorothiazide 20-12.5 Mg Tabs (Lisinopril-hydrochlorothiazide) .Marland Kitchen... Take 1 tablet by mouth once a day    Labs Reviewed: Creat: 1.0 (09/04/2009)    Reviewed HgBA1c results: 8.6 (09/03/2009)  8.7 (06/28/2009)  Orders: Venipuncture (89381) TLB-A1C / Hgb A1C (Glycohemoglobin) (83036-A1C) TLB-Microalbumin/Creat Ratio, Urine (82043-MALB)  Complete Medication List: 1)  Lantus 100 Unit/ml Soln (Insulin glargine) .... 85 subcutaneously as directed at bedtime 2)  Glucophage Xr 500 Mg Tb24 (Metformin hcl) .... Take 1 tablet by mouth twice a day 3)  Apidra 100 Unit/ml Soln (Insulin glulisine) .... 5 units before breakfast,8 units before lunch, 10  units before supper. 4)  Vytorin 10-40 Mg Tabs (Ezetimibe-simvastatin) .... Take 1  tablet by mouth once a day 5)  Lasix 80 Mg Tabs (Furosemide) .... 2 tabs in the am 6)  Spironolactone 25 Mg Tabs (Spironolactone) .... 2 by mouth once daily 7)  Aspirin 81 Mg Tbec (Aspirin) .... Take one tablet by mouth daily 8)  Lisinopril-hydrochlorothiazide 20-12.5 Mg Tabs (Lisinopril-hydrochlorothiazide) .... Take 1 tablet by mouth once a day 9)  Warfarin Sodium 5 Mg Tabs (Warfarin sodium) .... Use as directed by anticoagulation clinic 10)  Diazepam 2 Mg Tabs (Diazepam) .Marland Kitchen.. 1 by mouth two times a day 11)  Diabetic Shoes  .... Dx----diabetes dx --- pvd (poor circulation) 12)  Keflex 500 Mg Caps (Cephalexin) .... Take 2 tab two times a day  Patient Instructions: 1)  Increased dose of apidra to 8 before lunch and 10  before dinner  2)  take apidra even if your CBG is less than 100 as long as you are getting ready to eat 3)  Please schedule a follow-up appointment in 3 months .  Prescriptions: DIABETIC SHOES Dx----Diabetes Dx --- PVD (poor circulation)  #1 x 0   Entered by:   Jeremy Johann CMA   Authorized by:   Nolon Rod. Tenesha Garza MD   Signed by:   Jeremy Johann CMA on 12/04/2009   Method used:   Printed then faxed to ...       [facility]  [address] # [id number]* (retail)       [address]       [place], [place]  [address]       Ph: [phone] or [phone]       Fax: [phone]   RxID:   [id number]

## 2010-07-23 NOTE — Progress Notes (Signed)
Summary: thyroid mass, needs ultrasound  Phone Note Outgoing Call   Summary of Call: incidental thyroid mass found  @ carotid ultrasound Plan: Schedule a  thyroid ultrasound dx thyroid mass Jose E. Paz MD  January 26, 2010 10:55 PM   Follow-up for Phone Call        Left message with (wife?) she is going to have the pt call back. Army Fossa CMA  January 28, 2010 8:56 AM   Additional Follow-up for Phone Call Additional follow up Details #1::        Pt is aware, will set up [place].   New Problems: THYROID MASS (ICD-240.9)   New Problems: THYROID MASS (ICD-240.9)

## 2010-07-23 NOTE — Progress Notes (Signed)
Summary: WIFE CALLED TO SEE IF DR PAZ COULD ORDER TESTS  Phone Note Call from Patient Call back at CELL= [phone] - LINDA Lookabaugh--   Summary of Call: PATIENT IS NOW IN  FOR AMPUTATION OF BIG TOE  PATIENT'S WIFE SAYS THAT THEY KNOW PATIENT HAS MASS ON THYROID AND WAS "HOPING, SINCE PATIENT WAS IN HOSPITAL, THAT DR PAZ COULD ORDER TESTS WHILE PATIENT WAS THERE"  I CALLED AND TALKED TO DANIELLE--SHE POINTED OUT THAT DR PAZ COULD NOT ORDER TESTS BECAUSE HOSPITALIST WAS IN CHARGE OF PATIENT  CALLED CELL AND SPOKE TO WIFE--TOLD HER ABOVE INFO--SHE SAID SHE UNDERSTOOD Initial call taken by: Jerolyn Shin,  February 15, 2010 5:06 PM

## 2010-07-23 NOTE — Cardiovascular Report (Signed)
Summary: Office Visit   Office Visit   Imported By: Roderic Ovens 09/07/2009 16:20:46  _____________________________________________________________________  External Attachment:    Type:   Image     Comment:   External Document

## 2010-07-23 NOTE — Medication Information (Signed)
Summary: rov/ewj  Anticoagulant Therapy  Managed by: Weston Brass, PharmD Referring MD: Charlies Constable MD PCP: Nolon Rod. Paz MD Supervising MD: Daleen Squibb MD, Maisie Fus Indication 1: Atrial Fibrillation Lab Used: [facility] [place] Site: [address] INR POC 1.8 INR RANGE 2-3  Dietary changes: no    Health status changes: no    Bleeding/hemorrhagic complications: no    Recent/future hospitalizations: no    Any changes in medication regimen? no       Details: Began taking Vicodin 5-500   Recent/future dental: no  Any missed doses?: no       Is patient compliant with meds? yes       Allergies: 1)  ! Toprol Xl 2)  ! * Iv Potassium 3)  Actos (Pioglitazone Hcl)  Anticoagulation Management History:      The patient is taking warfarin and comes in today for a routine follow up visit.  Positive risk factors for bleeding include an age of 74 years or older and presence of serious comorbidities.  The bleeding index is 'intermediate risk'.  Positive CHADS2 values include History of CHF, History of HTN, and History of Diabetes.  Negative CHADS2 values include Age > 74 years old.  The start date was 07/30/2004.  His last INR was 2.5 ratio.  Anticoagulation responsible provider: Daleen Squibb MD, Maisie Fus.  INR POC: 1.8.  Cuvette Lot#: [id number].  Exp: 03/2011.    Anticoagulation Management Assessment/Plan:      The patient's current anticoagulation dose is Warfarin sodium 5 mg tabs: Use as directed by Anticoagulation Clinic.  The target INR is 2 - 3.  The next INR is due 02/12/2010.  Anticoagulation instructions were given to patient.  Results were reviewed/authorized by Weston Brass, PharmD.  He was notified by Liana Gerold, PharmD Candidate.         Prior Anticoagulation Instructions: INR 2.0  Take 7.5mg  today, then resume same dosage 5mg  daily except 7.5mg  on Wednesdays.  Recheck in 4 weeks.   Current Anticoagulation Instructions: INR 1.8  Today, Aug. 9, take 1.5 tablets.  Then begin taking  1 tablet daily except for 1.5 tablets Wed and Sat.  Return to clinic in 3 weeks.

## 2010-07-25 ENCOUNTER — Telehealth: Payer: Self-pay | Admitting: Internal Medicine

## 2010-07-25 NOTE — Cardiovascular Report (Signed)
Summary: Office Visit   Office Visit   Imported By: Roderic Ovens 06/06/2010 11:51:06  _____________________________________________________________________  External Attachment:    Type:   Image     Comment:   External Document

## 2010-07-25 NOTE — Cardiovascular Report (Signed)
Summary: TTM   TTM   Imported By: Roderic Ovens 06/20/2010 15:52:04  _____________________________________________________________________  External Attachment:    Type:   Image     Comment:   External Document

## 2010-07-25 NOTE — Progress Notes (Signed)
Summary: refill  Phone Note Refill Request Message from:  Fax from Pharmacy on June 10, 2010 3:57 PM  Refills Requested: Medication #1:  GLUCOPHAGE XR 500 MG TB24 Take 1 tablet in the morning and 1 tablet in the evening [facility] - [address] - [id number]  Initial call taken by: Okey Regal [place],  June 10, 2010 4:24 PM  Follow-up for Phone Call        are you the prescribing doctor for this med-- i see the directions have been changed a few times not by you. Army Fossa CMA  June 10, 2010 4:27 PM   Additional Follow-up for Phone Call Additional follow up Details #1::        okay to call one month supply, 2 RF please clarify  with the patient the actual dose he is taking Additional Follow-up by: [facility] E. Paz MD,  June 10, 2010 5:45 PM    Prescriptions: GLUCOPHAGE XR 500 MG TB24 (METFORMIN HCL) Take 1 tablet in the morning and 1 tablet in the evening  #60 x 2   Entered by:   Army Fossa CMA   Authorized by:   Nolon Rod. Paz MD   Signed by:   Army Fossa CMA on 06/11/2010   Method used:   Electronically to        [facility] # [id number]* (retail)       [address]       [place], [place]  [address]       Ph: [phone] or [phone]       Fax: [phone]   RxID:   [id number]

## 2010-07-25 NOTE — Assessment & Plan Note (Signed)
Summary: rto 3 months.cbs   Vital Signs:  Patient profile:   76 year old male Weight:      252.38 pounds Pulse rate:   82 / minute Pulse rhythm:   regular BP sitting:   130 / 72  (left arm) Cuff size:   large  Vitals Entered By: Army Fossa CMA (July 05, 2010 10:27 AM) CC: 3 month f/u- fasting  Comments no complaints  [facility] [address]    History of Present Illness: ROV coronary artery disease, saw  cardiology 05-27-10, stable. Chest x-ray unremarkable  DIABETES-- currently on Lantus 80-85 at bedtime,ambulatory CBGs 85 -140 in AM,  190-200 at PM (bedtime) depending on diet  cholesterol--due for labs, good medication compliance  Hypertension-- blood pressure is checked regularly, usually "within normal"  Review of systems He denies chest pain or shortness breath No lower extremity edema No symptoms of low blood sugars in long  time no nausea vomiting or  diarrhea diet remains stable, "okay" Exercise has not changed, can't exercise more because hip pain         Current Medications (verified): 1)  Lantus 100 Unit/ml Soln (Insulin Glargine) .... 55 Subcutaneously  At Bedtime 2)  Glucophage Xr 500 Mg Tb24 (Metformin Hcl) .... Take 1 Tablet in The Morning and 1 Tablet in The Evening 3)  Apidra 100 Unit/ml Soln (Insulin Glulisine) .... 5 Units Before Breakfast, 10 Units Before Lunch, 12   Units Before Supper. 4)  Vytorin 10-40 Mg Tabs (Ezetimibe-Simvastatin) .... Take One Tablet By Mouth Once Daily 5)  Lasix 80 Mg Tabs (Furosemide) .... 2 Tabs in The Am 6)  Spironolactone 25 Mg  Tabs (Spironolactone) .... Take 1 By Mouth Once Daily 7)  Aspirin 325 Mg Tabs (Aspirin) .Marland Kitchen.. 1 By Mouth Daily. 8)  Lisinopril-Hydrochlorothiazide 20-12.5 Mg Tabs (Lisinopril-Hydrochlorothiazide) .... Take 1 Tablet By Mouth Once A Day 9)  Warfarin Sodium 5 Mg Tabs (Warfarin Sodium) .... Use As Directed By Anticoagulation Clinic 10)  Diabetic Shoes .... Dx----Diabetes Dx --- Pvd (Poor  Circulation) 11)  Mvi  Allergies (verified): 1)  ! Toprol Xl 2)  ! * Iv Potassium 3)  Actos (Pioglitazone Hcl)  Past History:  Past Medical History:    DIABETES MELLITUS, TYPE II ----used to see Dr Roanna Raider, now f/u by Dr Drue Novel    (+) retinopathy, cataracts (sees [facility])    HTN    AORTIC VALVE REPLACEMENT (biophrostetic) for Ao Stenosis 2006    CABG 2006    ATRIAL FIBRILLATION , onset after CABG------Coumadin management at Coumadin clinic    CHF after CABG    Pacemaker 2-07 VVI    PVD :        08-2005: CT  angio showed-- vascular insuff, intestine and RAS bilaterally    angiogram 02-2009: severe PVD medical management      E.D.    OBESITY, MORBID      OBSTRUCTIVE SLEEP APNEA-- not using CPAP    NEPHROLITHIASIS, HX OF       Past Surgical History: Reviewed history from 04/04/2010 and no changes required. TONSILLECTOMY  CATARACT EXTRACTIONS, BILATERAL, HX OF (ICD-V45.61) * CARDIOVERSION PACEMAKER, PERMANENT (ICD-V45.01) AORTIC VALVE REPLACEMENT (biophrostetic) for Ao Stenosis 2006 CABG 2006 Left great toe amputation  01-2010  Family History: Reviewed history from 08/16/2008 and no changes required. colon ca--no prostate ca--no Father:Died at age 59 Heart Attack Mother: Died at age 56 of a stroke Siblings: One sister died at age 47 heart problems, stomach aneurysm 3 other sisters living  with heart problems and diabetes.  Social History: Reviewed history from 12/04/2009 and no changes required. 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 (70 pack years) No alcohol use No illicit drugs diet-- improved compared to last OV exercise -- some , limited by pain   Physical Exam  General:  alert, well-developed, and overweight-appearing.   Lungs:  normal respiratory effort, no intercostal retractions, and no accessory muscle use.   Heart:  normal rate and regular rhythm.   Extremities:  no pretibial edema Psych:   Oriented X3, memory intact for recent and remote, normally interactive, good eye contact, not anxious appearing, and not depressed appearing.     Impression & Recommendations:  Problem # 1:  HYPERLIPIDEMIA (ICD-272.4) labs  His updated medication list for this problem includes:    Vytorin 10-40 Mg Tabs (Ezetimibe-simvastatin) .Marland Kitchen... Take one tablet by mouth once daily  Orders: TLB-Lipid Panel (80061-LIPID) TLB-ALT (SGPT) (84460-ALT) TLB-AST (SGOT) (84450-SGOT) Specimen Handling (60454)  Labs Reviewed: SGOT: 22 (09/04/2009)   SGPT: 19 (09/04/2009)   HDL:39.30 (09/04/2009), 32.30 (06/28/2009)  LDL:56 (09/04/2009), 65 (06/28/2009)  Chol:122 (09/04/2009), 120 (06/28/2009)  Trig:132.0 (09/04/2009), 114.0 (06/28/2009)  Problem # 2:  HYPERTENSION (ICD-401.9) at goal  His updated medication list for this problem includes:    Lasix 80 Mg Tabs (Furosemide) .Marland Kitchen... 2 tabs in the am    Spironolactone 25 Mg Tabs (Spironolactone) .Marland Kitchen... Take 1 by mouth once daily    Lisinopril-hydrochlorothiazide 20-12.5 Mg Tabs (Lisinopril-hydrochlorothiazide) .Marland Kitchen... Take 1 tablet by mouth once a day  Orders: TLB-BMP (Basic Metabolic Panel-BMET) (80048-METABOL) Specimen Handling (09811)  BP today: 130/72 Prior BP: 110/60 (05/27/2010)  Labs Reviewed: K+: 4.3 (12/31/2009) Creat: : 1.1 (12/31/2009)   Chol: 122 (09/04/2009)   HDL: 39.30 (09/04/2009)   LDL: 56 (09/04/2009)   TG: 132.0 (09/04/2009)  Problem # 3:  CORONARY ARTERY DISEASE (ICD-414.00) asx, stable per last cardiology assessment His updated medication list for this problem includes:    Lasix 80 Mg Tabs (Furosemide) .Marland Kitchen... 2 tabs in the am    Spironolactone 25 Mg Tabs (Spironolactone) .Marland Kitchen... Take 1 by mouth once daily    Aspirin 325 Mg Tabs (Aspirin) .Marland Kitchen... 1 by mouth daily.    Lisinopril-hydrochlorothiazide 20-12.5 Mg Tabs (Lisinopril-hydrochlorothiazide) .Marland Kitchen... Take 1 tablet by mouth once a day  Problem # 4:  DIABETES MELLITUS, TYPE II  (ICD-250.00) not well controlled her last A1c, since then he has gradually increase Lantus to 80 or 85 units at bedtime he is morning  CBGs seem appropriate CBGs in the afternoon are higher labs adjust medication with results  His updated medication list for this problem includes:    Lantus 100 Unit/ml Soln (Insulin glargine) .Marland KitchenMarland KitchenMarland KitchenMarland Kitchen 80 to 85 subcutaneously  at bedtime    Glucophage Xr 500 Mg Tb24 (Metformin hcl) .Marland Kitchen... Take 1 tablet in the morning and 1 tablet in the evening    Apidra 100 Unit/ml Soln (Insulin glulisine) .Marland KitchenMarland KitchenMarland KitchenMarland Kitchen 5 units before breakfast, 10 units before lunch, 12   units before supper.    Aspirin 325 Mg Tabs (Aspirin) .Marland Kitchen... 1 by mouth daily.    Lisinopril-hydrochlorothiazide 20-12.5 Mg Tabs (Lisinopril-hydrochlorothiazide) .Marland Kitchen... Take 1 tablet by mouth once a day  Orders: Venipuncture (91478) TLB-A1C / Hgb A1C (Glycohemoglobin) (83036-A1C) Specimen Handling (29562)  Labs Reviewed: Creat: 1.1 (12/31/2009)    Reviewed HgBA1c results: 7.4 (04/04/2010)  7.9 (12/04/2009)  Problem # 5:  THYROID NODULE (ICD-241.0) abnormal u/s 01-2010, borderline for BX plan: repeat u/s 07-2010  Complete Medication  List: 1)  Lantus 100 Unit/ml Soln (Insulin glargine) .... 80 to 85 subcutaneously  at bedtime 2)  Glucophage Xr 500 Mg Tb24 (Metformin hcl) .... Take 1 tablet in the morning and 1 tablet in the evening 3)  Apidra 100 Unit/ml Soln (Insulin glulisine) .... 5 units before breakfast, 10 units before lunch, 12   units before supper. 4)  Vytorin 10-40 Mg Tabs (Ezetimibe-simvastatin) .... Take one tablet by mouth once daily 5)  Lasix 80 Mg Tabs (Furosemide) .... 2 tabs in the am 6)  Spironolactone 25 Mg Tabs (Spironolactone) .... Take 1 by mouth once daily 7)  Aspirin 325 Mg Tabs (Aspirin) .Marland Kitchen.. 1 by mouth daily. 8)  Lisinopril-hydrochlorothiazide 20-12.5 Mg Tabs (Lisinopril-hydrochlorothiazide) .... Take 1 tablet by mouth once a day 9)  Warfarin Sodium 5 Mg Tabs (Warfarin sodium) .... Use  as directed by anticoagulation clinic 10)  Diabetic Shoes  .... Dx----diabetes dx --- pvd (poor circulation) 11)  Mvi   Patient Instructions: 1)  Please schedule a follow-up appointment in 3 months . physical exam   Orders Added: 1)  Venipuncture [36415] 2)  TLB-Lipid Panel [80061-LIPID] 3)  TLB-A1C / Hgb A1C (Glycohemoglobin) [83036-A1C] 4)  TLB-BMP (Basic Metabolic Panel-BMET) [80048-METABOL] 5)  TLB-ALT (SGPT) [84460-ALT] 6)  TLB-AST (SGOT) [84450-SGOT] 7)  Specimen Handling [99000] 8)  Est. Patient Level IV [82956]

## 2010-07-25 NOTE — Medication Information (Signed)
Summary: rov/tm  Anticoagulant Therapy  Managed by: Weston Brass, PharmD Referring MD: Charlies Constable MD PCP: Nolon Rod. Paz MD Supervising MD: Jens Som MD, Arlys John Indication 1: Atrial Fibrillation Lab Used: [facility] [place] Site: [address] INR POC 1.9 INR RANGE 2-3  Dietary changes: yes       Details: Extra greens this week   Health status changes: no    Bleeding/hemorrhagic complications: no    Recent/future hospitalizations: no    Any changes in medication regimen? no    Recent/future dental: no  Any missed doses?: no       Is patient compliant with meds? yes       Allergies: 1)  ! Toprol Xl 2)  ! * Iv Potassium 3)  Actos (Pioglitazone Hcl)  Anticoagulation Management History:      The patient is taking warfarin and comes in today for a routine follow up visit.  Positive risk factors for bleeding include an age of 74 years or older and presence of serious comorbidities.  The bleeding index is 'intermediate risk'.  Positive CHADS2 values include History of CHF, History of HTN, and History of Diabetes.  Negative CHADS2 values include Age > 74 years old.  The start date was 07/30/2004.  His last INR was 2.5 ratio.  Anticoagulation responsible provider: Jens Som MD, Arlys John.  INR POC: 1.9.  Cuvette Lot#: [id number].  Exp: 07/2011.    Anticoagulation Management Assessment/Plan:      The patient's current anticoagulation dose is Warfarin sodium 5 mg tabs: Use as directed by Anticoagulation Clinic.  The target INR is 2 - 3.  The next INR is due 07/29/2010.  Anticoagulation instructions were given to patient.  Results were reviewed/authorized by Weston Brass, PharmD.  He was notified by Stephannie Peters, PharmD Candidate .         Prior Anticoagulation Instructions: INR 1.9 Today take 7.5mg s then resume 5mg s everyday except 10mg s on Wednesdays. Recheck in 4 weeks.   Current Anticoagulation Instructions: INR 1.9  Coumadin 5 mg tablets - Take an extra half tablet today,  then continue 1 tablet daily except 2 tablets on Wednesdays

## 2010-07-26 ENCOUNTER — Telehealth: Payer: Self-pay | Admitting: Internal Medicine

## 2010-07-26 NOTE — Medication Information (Signed)
Summary: [facility]  [facility]   Imported By: Lanelle Bal 07/11/2009 13:15:44  _____________________________________________________________________  External Attachment:    Type:   Image     Comment:   External Document

## 2010-07-29 ENCOUNTER — Other Ambulatory Visit: Payer: Self-pay | Admitting: Internal Medicine

## 2010-07-29 ENCOUNTER — Encounter (INDEPENDENT_AMBULATORY_CARE_PROVIDER_SITE_OTHER): Payer: Medicare Other

## 2010-07-29 ENCOUNTER — Encounter: Payer: Self-pay | Admitting: Cardiology

## 2010-07-29 DIAGNOSIS — I4891 Unspecified atrial fibrillation: Secondary | ICD-10-CM

## 2010-07-29 DIAGNOSIS — E041 Nontoxic single thyroid nodule: Secondary | ICD-10-CM

## 2010-07-29 DIAGNOSIS — Z7901 Long term (current) use of anticoagulants: Secondary | ICD-10-CM

## 2010-07-29 LAB — CONVERTED CEMR LAB: POC INR: 2.1

## 2010-07-30 ENCOUNTER — Ambulatory Visit
Admission: RE | Admit: 2010-07-30 | Discharge: 2010-07-30 | Disposition: A | Discharge status: Home / Self Care | Payer: Medicare Other | Source: Ambulatory Visit | Attending: Internal Medicine | Admitting: Internal Medicine

## 2010-07-30 DIAGNOSIS — E041 Nontoxic single thyroid nodule: Secondary | ICD-10-CM

## 2010-07-31 NOTE — Cardiovascular Report (Signed)
Summary: TTM   TTM   Imported By: Roderic Ovens 07/22/2010 11:36:18  _____________________________________________________________________  External Attachment:    Type:   Image     Comment:   External Document

## 2010-07-31 NOTE — Progress Notes (Signed)
Summary: needs thyroid u/s / BS Reading   Phone Note Outgoing Call   Summary of Call: arrange repeated thyroid u/s  dx --- thyroid nodule Sandralee Tarkington E. Tiwanda Threats MD  July 25, 2010 4:37 PM   Follow-up for Phone Call        I spoke with pt he is aware. Pt also gave his BS Readings... 2/3 7:23 am- 91  2/2 3:24 pm- 106 435 am- 94 2/1 3:02 pm- 122 7:56 am- 89 1/31 435 pm- 71 10:41 am- 131 1/30 8:39 am- 96 5:11 pm- 327 1/29 8:10 am- 111 7:22 pm- 197 1/28 9:19 am- 161  1/27 7:44 pm- 228 9:34 am- 113 1/26- 9 am- 125 1/25 10:15 pm- 242 3 pm- 170 5am- 75 1/24- 815 am- 98 1/23 1030 pm-153 2 pm- 122 1/22 830 pm- 200 11:30 am- 119 Follow-up by: Army Fossa CMA,  July 26, 2010 9:48 AM  Additional Follow-up for Phone Call Additional follow up Details #1::        late sugars seem elevated, the only change I suggest is increase Epidra before supper from 16 to to 20 units. Bring a log in 3 weeks  Danniella Robben E. Keirra Zeimet MD  July 26, 2010 1:20 PM     Additional Follow-up for Phone Call Additional follow up Details #2::    I spoke w/ pt he is aware. Army Fossa CMA  July 26, 2010 3:28 PM   New/Updated Medications: APIDRA 100 UNIT/ML SOLN (INSULIN GLULISINE) 8 units before breakfast, 14 units before lunch, 20  units before supper.

## 2010-08-08 NOTE — Medication Information (Signed)
Summary: [facility]  Anticoagulant Therapy  Managed by: Weston Brass, PharmD Referring MD: Charlies Constable MD PCP: Nolon Rod. Paz MD Supervising MD: Myrtis Ser MD, Tinnie Gens Indication 1: Atrial Fibrillation Lab Used: [facility] [place] Site: [address] INR POC 2.1 INR RANGE 2-3  Dietary changes: no    Health status changes: no    Bleeding/hemorrhagic complications: no    Recent/future hospitalizations: no    Any changes in medication regimen? no    Recent/future dental: no  Any missed doses?: no       Is patient compliant with meds? yes       Allergies: 1)  ! Toprol Xl 2)  ! * Iv Potassium 3)  Actos (Pioglitazone Hcl)  Anticoagulation Management History:      The patient is taking warfarin and comes in today for a routine follow up visit.  Positive risk factors for bleeding include an age of 15 years or older and presence of serious comorbidities.  The bleeding index is 'intermediate risk'.  Positive CHADS2 values include History of CHF, History of HTN, and History of Diabetes.  Negative CHADS2 values include Age > 59 years old.  The start date was 07/30/2004.  His last INR was 2.5 ratio.  Anticoagulation responsible provider: Myrtis Ser MD, Tinnie Gens.  INR POC: 2.1.  Cuvette Lot#: [id number].  Exp: 06/2011.    Anticoagulation Management Assessment/Plan:      The patient's current anticoagulation dose is Warfarin sodium 5 mg tabs: Use as directed by Anticoagulation Clinic.  The target INR is 2 - 3.  The next INR is due 08/26/2010.  Anticoagulation instructions were given to patient.  Results were reviewed/authorized by Weston Brass, PharmD.  He was notified by Weston Brass PharmD.         Prior Anticoagulation Instructions: INR 1.9  Coumadin 5 mg tablets - Take an extra half tablet today, then continue 1 tablet daily except 2 tablets on Wednesdays   Current Anticoagulation Instructions: INR 2.1  Continue same dose of 1 tablet every day except 2 tablets on Wednesday.  Recheck  INR in 4 weeks.

## 2010-08-08 NOTE — Progress Notes (Signed)
Summary: FYI--Apidra dosage  Phone Note Call from Patient Call back at [facility] Phone [phone]   Summary of Call: FYI--Patient called back noting that he thinks his sugars are not that bad and he does want to increase his Apidra. He notes that increased dosages of that med made him gain weight.  He notes that he has tried very hard to get his weight down and will adjust the Apidra if his sugars stay in the high 200's-400's. Initial call taken by: Lucious Groves CMA,  July 26, 2010 4:38 PM  Follow-up for Phone Call        his last hemoglobin A1c was 8.0. he definitely needs to improve his diabetes. I strongly recommended him to adjust his insulin. A better diet may offset some of the weight gain. Arrange  a OV  if needed. We could also refer him to endocrinologist. Nolon Rod. Cray Monnin MD  July 29, 2010 5:14 PM   Additional Follow-up for Phone Call Additional follow up Details #1::        Patient notified and states that it does not work with him. He states that he did adjust his insulin. Patient declined endocrinology referral. Additional Follow-up by: Lucious Groves CMA,  July 30, 2010 9:16 AM

## 2010-08-26 ENCOUNTER — Ambulatory Visit: Payer: Self-pay | Admitting: Surgery

## 2010-08-27 ENCOUNTER — Other Ambulatory Visit: Payer: Self-pay | Admitting: Dermatology

## 2010-08-28 ENCOUNTER — Encounter: Payer: Self-pay | Admitting: Internal Medicine

## 2010-08-29 ENCOUNTER — Telehealth: Payer: Self-pay | Admitting: Cardiology

## 2010-08-29 ENCOUNTER — Encounter: Payer: Self-pay | Admitting: Cardiology

## 2010-08-29 DIAGNOSIS — I4891 Unspecified atrial fibrillation: Secondary | ICD-10-CM

## 2010-08-29 DIAGNOSIS — I359 Nonrheumatic aortic valve disorder, unspecified: Secondary | ICD-10-CM

## 2010-09-02 ENCOUNTER — Encounter: Payer: Self-pay | Admitting: Cardiology

## 2010-09-02 ENCOUNTER — Encounter (INDEPENDENT_AMBULATORY_CARE_PROVIDER_SITE_OTHER): Payer: Medicare Other

## 2010-09-02 ENCOUNTER — Ambulatory Visit (INDEPENDENT_AMBULATORY_CARE_PROVIDER_SITE_OTHER): Payer: Medicare Other | Admitting: Surgery

## 2010-09-02 DIAGNOSIS — I739 Peripheral vascular disease, unspecified: Secondary | ICD-10-CM

## 2010-09-02 DIAGNOSIS — Z48812 Encounter for surgical aftercare following surgery on the circulatory system: Secondary | ICD-10-CM

## 2010-09-02 DIAGNOSIS — I4891 Unspecified atrial fibrillation: Secondary | ICD-10-CM

## 2010-09-02 DIAGNOSIS — Z7901 Long term (current) use of anticoagulants: Secondary | ICD-10-CM

## 2010-09-03 NOTE — Assessment & Plan Note (Addendum)
OFFICE VISIT  WILBERN, PENNYPACKER DOB:  July 31, 1934                                       09/02/2010 ZHYQM#:[id number]  The patient comes back today for a followup.  He is status post subintimal recannulization of left anterior tibial artery with balloon angioplasty as well as stenting of his left superficial femoral artery on February 10, 2010.  He also had left great toe amputation on August 25. I debulked the scab on his toe amputation which continues last week and have brought him back for followup.  He has had no complaints.  The toe is well healed.  He is not having any pain.  PHYSICAL EXAMINATION:  Vital signs:  Heart rate 79, blood pressure 136/76, respiratory rate 22.  General:  He is well-appearing, no distress.  Respirations:  Nonlabored.  Pedal pulses are not palpable. The left great toe amputation is well healed.  There is still some eschar on the end but it is healed.  Duplex was performed today which shows patent SFA and anterior tibial artery; however, he his ABIs are severely decreased.  I believe the patient's lower extremity intervention is widely patent and it has enabled him to heal his leg.  We will continue to keep a close eye on his toe.  I am going to plan on seeing him back in 6 months with a repeat ultrasound.    [name], MD Electronically Signed  VWB/MEDQ  D:  09/02/2010  T:  09/03/2010  Job:  [id number]

## 2010-09-03 NOTE — Progress Notes (Signed)
Summary: PT HAVING SOB  Phone Note Call from Patient Call back at Home Phone [phone]   Caller: Patient Summary of Call: pT HAVING SOB Initial call taken by: Judie Grieve,  August 29, 2010 10:55 AM  Follow-up for Phone Call        I talked with pt--pt states he has gradually noticed an increase in SOB--pt denies weight gain(he states he has actually lost weight), no change in edema(some swelling on leg that his toe has been removed--this is unchanged), pt denies orthopnea, pt denies chest pain or tightness--he really cannot establish a pattern for his increase in SOB and states he is SOB at rest--I offered pt an appt today or tomorrow with PA but pt declined--I given pt appt with Dr Shirlee Latch 09/10/10--pt to call if any changes in his symptoms

## 2010-09-05 LAB — VANCOMYCIN, TROUGH
Vancomycin Tr: 20.6 ug/mL — ABNORMAL HIGH (ref 10.0–20.0)
Vancomycin Tr: 38.5 ug/mL (ref 10.0–20.0)

## 2010-09-05 LAB — BASIC METABOLIC PANEL
BUN: 11 mg/dL (ref 6–23)
BUN: 11 mg/dL (ref 6–23)
BUN: 14 mg/dL (ref 6–23)
BUN: 19 mg/dL (ref 6–23)
CO2: 33 mEq/L — ABNORMAL HIGH (ref 19–32)
CO2: 33 mEq/L — ABNORMAL HIGH (ref 19–32)
Calcium: 9.3 mg/dL (ref 8.4–10.5)
Calcium: 9.5 mg/dL (ref 8.4–10.5)
Chloride: 100 mEq/L (ref 96–112)
Chloride: 102 mEq/L (ref 96–112)
Chloride: 96 mEq/L (ref 96–112)
Chloride: 97 mEq/L (ref 96–112)
Creatinine, Ser: 1.22 mg/dL (ref 0.4–1.5)
Creatinine, Ser: 1.27 mg/dL (ref 0.4–1.5)
GFR calc Af Amer: >60 mL/min (ref >60)
GFR calc Af Amer: >60 mL/min (ref >60)
GFR calc Af Amer: >60 mL/min (ref >60)
GFR calc non Af Amer: 54 mL/min — ABNORMAL LOW (ref >60)
GFR calc non Af Amer: 56 mL/min — ABNORMAL LOW (ref >60)
GFR calc non Af Amer: 58 mL/min — ABNORMAL LOW (ref >60)
GFR calc non Af Amer: >60 mL/min (ref >60)
Glucose, Bld: 161 mg/dL — ABNORMAL HIGH (ref 70–99)
Glucose, Bld: 179 mg/dL — ABNORMAL HIGH (ref 70–99)
Potassium: 3.4 mEq/L — ABNORMAL LOW (ref 3.5–5.1)
Potassium: 3.8 mEq/L (ref 3.5–5.1)
Potassium: 4 mEq/L (ref 3.5–5.1)
Potassium: 4 mEq/L (ref 3.5–5.1)
Sodium: 135 mEq/L (ref 135–145)
Sodium: 139 mEq/L (ref 135–145)
Sodium: 139 mEq/L (ref 135–145)

## 2010-09-05 LAB — CBC
HCT: 30 % — ABNORMAL LOW (ref 39.0–52.0)
HCT: 31.5 % — ABNORMAL LOW (ref 39.0–52.0)
HCT: 32 % — ABNORMAL LOW (ref 39.0–52.0)
HCT: 34.3 % — ABNORMAL LOW (ref 39.0–52.0)
HCT: 35.3 % — ABNORMAL LOW (ref 39.0–52.0)
HCT: 35.5 % — ABNORMAL LOW (ref 39.0–52.0)
HCT: 36.2 % — ABNORMAL LOW (ref 39.0–52.0)
HCT: 36.5 % — ABNORMAL LOW (ref 39.0–52.0)
Hemoglobin: 13.3 g/dL (ref 13.0–17.0)
Hemoglobin: 10.2 g/dL — ABNORMAL LOW (ref 13.0–17.0)
Hemoglobin: 10.5 g/dL — ABNORMAL LOW (ref 13.0–17.0)
Hemoglobin: 10.9 g/dL — ABNORMAL LOW (ref 13.0–17.0)
Hemoglobin: 11.5 g/dL — ABNORMAL LOW (ref 13.0–17.0)
Hemoglobin: 11.7 g/dL — ABNORMAL LOW (ref 13.0–17.0)
Hemoglobin: 12.1 g/dL — ABNORMAL LOW (ref 13.0–17.0)
Hemoglobin: 12.1 g/dL — ABNORMAL LOW (ref 13.0–17.0)
MCH: 29.6 pg (ref 26.0–34.0)
MCH: 30 pg (ref 26.0–34.0)
MCH: 29.1 pg (ref 26.0–34.0)
MCH: 29.7 pg (ref 26.0–34.0)
MCH: 30.2 pg (ref 26.0–34.0)
MCHC: 32.7 g/dL (ref 30.0–36.0)
MCHC: 33.5 g/dL (ref 30.0–36.0)
MCHC: 33.1 g/dL (ref 30.0–36.0)
MCHC: 33.2 g/dL (ref 30.0–36.0)
MCHC: 33.4 g/dL (ref 30.0–36.0)
MCHC: 33.5 g/dL (ref 30.0–36.0)
MCV: 88.8 fL (ref 78.0–100.0)
MCV: 88.8 fL (ref 78.0–100.0)
MCV: 89.2 fL (ref 78.0–100.0)
MCV: 89.8 fL (ref 78.0–100.0)
MCV: 90.1 fL (ref 78.0–100.0)
MCV: 90.3 fL (ref 78.0–100.0)
Platelets: 379 10*3/uL (ref 150–400)
Platelets: 385 10*3/uL (ref 150–400)
Platelets: 246 10*3/uL (ref 150–400)
Platelets: 296 10*3/uL (ref 150–400)
Platelets: 329 10*3/uL (ref 150–400)
RBC: 4.53 MIL/uL (ref 4.22–5.81)
RBC: 3.59 MIL/uL — ABNORMAL LOW (ref 4.22–5.81)
RBC: 3.77 MIL/uL — ABNORMAL LOW (ref 4.22–5.81)
RBC: 3.98 MIL/uL — ABNORMAL LOW (ref 4.22–5.81)
RBC: 4.01 MIL/uL — ABNORMAL LOW (ref 4.22–5.81)
RDW: 14.8 % (ref 11.5–15.5)
RDW: 13.7 % (ref 11.5–15.5)
RDW: 13.7 % (ref 11.5–15.5)
RDW: 14.3 % (ref 11.5–15.5)
RDW: 14.5 % (ref 11.5–15.5)
WBC: 10.4 10*3/uL (ref 4.0–10.5)
WBC: 10.5 10*3/uL (ref 4.0–10.5)
WBC: 10.5 10*3/uL (ref 4.0–10.5)
WBC: 11.4 10*3/uL — ABNORMAL HIGH (ref 4.0–10.5)
WBC: 11.4 10*3/uL — ABNORMAL HIGH (ref 4.0–10.5)
WBC: 11.6 10*3/uL — ABNORMAL HIGH (ref 4.0–10.5)
WBC: 12.1 10*3/uL — ABNORMAL HIGH (ref 4.0–10.5)

## 2010-09-05 LAB — GLUCOSE, CAPILLARY
Glucose-Capillary: 192 mg/dL — ABNORMAL HIGH (ref 70–99)
Glucose-Capillary: 106 mg/dL — ABNORMAL HIGH (ref 70–99)
Glucose-Capillary: 124 mg/dL — ABNORMAL HIGH (ref 70–99)
Glucose-Capillary: 127 mg/dL — ABNORMAL HIGH (ref 70–99)
Glucose-Capillary: 130 mg/dL — ABNORMAL HIGH (ref 70–99)
Glucose-Capillary: 139 mg/dL — ABNORMAL HIGH (ref 70–99)
Glucose-Capillary: 146 mg/dL — ABNORMAL HIGH (ref 70–99)
Glucose-Capillary: 159 mg/dL — ABNORMAL HIGH (ref 70–99)
Glucose-Capillary: 165 mg/dL — ABNORMAL HIGH (ref 70–99)
Glucose-Capillary: 168 mg/dL — ABNORMAL HIGH (ref 70–99)
Glucose-Capillary: 171 mg/dL — ABNORMAL HIGH (ref 70–99)
Glucose-Capillary: 174 mg/dL — ABNORMAL HIGH (ref 70–99)
Glucose-Capillary: 175 mg/dL — ABNORMAL HIGH (ref 70–99)
Glucose-Capillary: 176 mg/dL — ABNORMAL HIGH (ref 70–99)
Glucose-Capillary: 178 mg/dL — ABNORMAL HIGH (ref 70–99)
Glucose-Capillary: 182 mg/dL — ABNORMAL HIGH (ref 70–99)
Glucose-Capillary: 189 mg/dL — ABNORMAL HIGH (ref 70–99)
Glucose-Capillary: 201 mg/dL — ABNORMAL HIGH (ref 70–99)
Glucose-Capillary: 211 mg/dL — ABNORMAL HIGH (ref 70–99)
Glucose-Capillary: 221 mg/dL — ABNORMAL HIGH (ref 70–99)
Glucose-Capillary: 228 mg/dL — ABNORMAL HIGH (ref 70–99)
Glucose-Capillary: 243 mg/dL — ABNORMAL HIGH (ref 70–99)
Glucose-Capillary: 243 mg/dL — ABNORMAL HIGH (ref 70–99)
Glucose-Capillary: 244 mg/dL — ABNORMAL HIGH (ref 70–99)
Glucose-Capillary: 252 mg/dL — ABNORMAL HIGH (ref 70–99)
Glucose-Capillary: 267 mg/dL — ABNORMAL HIGH (ref 70–99)
Glucose-Capillary: 64 mg/dL — ABNORMAL LOW (ref 70–99)
Glucose-Capillary: 87 mg/dL (ref 70–99)
Glucose-Capillary: 87 mg/dL (ref 70–99)
Glucose-Capillary: 87 mg/dL (ref 70–99)
Glucose-Capillary: 88 mg/dL (ref 70–99)
Glucose-Capillary: 97 mg/dL (ref 70–99)

## 2010-09-05 LAB — MAGNESIUM
Magnesium: 1.9 mg/dL (ref 1.5–2.5)
Magnesium: 1.9 mg/dL (ref 1.5–2.5)
Magnesium: 2.1 mg/dL (ref 1.5–2.5)

## 2010-09-05 LAB — HEPARIN LEVEL (UNFRACTIONATED)
Heparin Unfractionated: 0.19 IU/mL — ABNORMAL LOW (ref 0.30–0.70)
Heparin Unfractionated: 0.29 IU/mL — ABNORMAL LOW (ref 0.30–0.70)
Heparin Unfractionated: 0.31 IU/mL (ref 0.30–0.70)
Heparin Unfractionated: 0.36 IU/mL (ref 0.30–0.70)
Heparin Unfractionated: 0.59 IU/mL (ref 0.30–0.70)
Heparin Unfractionated: 0.67 IU/mL (ref 0.30–0.70)

## 2010-09-05 LAB — PROTIME-INR
INR: 1.31 (ref 0.00–1.49)
INR: 1.89 — ABNORMAL HIGH (ref 0.00–1.49)
Prothrombin Time: 26.5 seconds — ABNORMAL HIGH (ref 11.6–15.2)
Prothrombin Time: 26.5 seconds — ABNORMAL HIGH (ref 11.6–15.2)
Prothrombin Time: 16.5 seconds — ABNORMAL HIGH (ref 11.6–15.2)

## 2010-09-05 NOTE — Procedures (Unsigned)
LOWER EXTREMITY ARTERIAL DUPLEX  INDICATION:  Follow up stent.  HISTORY: Diabetes:  Yes. Cardiac:  Congestive heart failure. Hypertension:  Yes. Smoking:  Previous. Previous Surgery:  Left superficial femoral artery stent, a left anterior tibial artery percutaneous transluminal angioplasty 02/10/2010, left great toe amputation 02/14/2010.  SINGLE LEVEL ARTERIAL EXAM                         RIGHT                LEFT Brachial:               148                  130 Anterior tibial:        >255                 217 Posterior tibial:       65                   189 Peroneal: Ankle/Brachial Index:   0.44                 1.47  LOWER EXTREMITY ARTERIAL DUPLEX EXAM  DUPLEX:  Triphasic Doppler waveforms throughout the left lower extremity.  Velocities appear within normal limits.  There are areas of incomplete color filling within the mid distal superficial femoral artery.  This could be secondary to vessel calcification versus occlusion.  IMPRESSION:  Inconclusive ankle brachial index due to vessel calcification.  Toe brachial index suggestive of severe ischemia. Patent left superficial femoral artery stent and tibial artery percutaneous transluminal angioplasty as described above.  ___________________________________________ V. Charlena Cross, MD  OD/MEDQ  D:  09/02/2010  T:  09/02/2010  Job:  [id number]

## 2010-09-06 LAB — GLUCOSE, CAPILLARY
Glucose-Capillary: 147 mg/dL — ABNORMAL HIGH (ref 70–99)
Glucose-Capillary: 156 mg/dL — ABNORMAL HIGH (ref 70–99)
Glucose-Capillary: 176 mg/dL — ABNORMAL HIGH (ref 70–99)
Glucose-Capillary: 193 mg/dL — ABNORMAL HIGH (ref 70–99)
Glucose-Capillary: 215 mg/dL — ABNORMAL HIGH (ref 70–99)
Glucose-Capillary: 181 mg/dL — ABNORMAL HIGH (ref 70–99)
Glucose-Capillary: 207 mg/dL — ABNORMAL HIGH (ref 70–99)
Glucose-Capillary: 208 mg/dL — ABNORMAL HIGH (ref 70–99)
Glucose-Capillary: 220 mg/dL — ABNORMAL HIGH (ref 70–99)
Glucose-Capillary: 248 mg/dL — ABNORMAL HIGH (ref 70–99)
Glucose-Capillary: 273 mg/dL — ABNORMAL HIGH (ref 70–99)
Glucose-Capillary: 324 mg/dL — ABNORMAL HIGH (ref 70–99)

## 2010-09-06 LAB — BASIC METABOLIC PANEL
BUN: 20 mg/dL (ref 6–23)
CO2: 32 mEq/L (ref 19–32)
Calcium: 9.5 mg/dL (ref 8.4–10.5)
Calcium: 9.9 mg/dL (ref 8.4–10.5)
Chloride: 98 mEq/L (ref 96–112)
Chloride: 99 mEq/L (ref 96–112)
Creatinine, Ser: 1.08 mg/dL (ref 0.4–1.5)
GFR calc Af Amer: >60 mL/min (ref >60)
GFR calc Af Amer: >60 mL/min (ref >60)
GFR calc non Af Amer: 57 mL/min — ABNORMAL LOW (ref >60)
GFR calc non Af Amer: >60 mL/min (ref >60)
Glucose, Bld: 246 mg/dL — ABNORMAL HIGH (ref 70–99)
Potassium: 4.1 mEq/L (ref 3.5–5.1)
Potassium: 4.4 mEq/L (ref 3.5–5.1)
Sodium: 134 mEq/L — ABNORMAL LOW (ref 135–145)
Sodium: 137 mEq/L (ref 135–145)
Sodium: 137 mEq/L (ref 135–145)

## 2010-09-06 LAB — PROTIME-INR
INR: 1.35 (ref 0.00–1.49)
INR: 1.37 (ref 0.00–1.49)
Prothrombin Time: 17.1 seconds — ABNORMAL HIGH (ref 11.6–15.2)

## 2010-09-06 LAB — CBC
HCT: 33.4 % — ABNORMAL LOW (ref 39.0–52.0)
HCT: 34.4 % — ABNORMAL LOW (ref 39.0–52.0)
HCT: 36.7 % — ABNORMAL LOW (ref 39.0–52.0)
Hemoglobin: 11.8 g/dL — ABNORMAL LOW (ref 13.0–17.0)
Hemoglobin: 12.7 g/dL — ABNORMAL LOW (ref 13.0–17.0)
MCH: 30.5 pg (ref 26.0–34.0)
MCH: 30.8 pg (ref 26.0–34.0)
MCHC: 34.1 g/dL (ref 30.0–36.0)
MCHC: 34.1 g/dL (ref 30.0–36.0)
MCHC: 34.3 g/dL (ref 30.0–36.0)
MCV: 89.6 fL (ref 78.0–100.0)
Platelets: 217 10*3/uL (ref 150–400)
Platelets: 230 10*3/uL (ref 150–400)
RBC: 3.86 MIL/uL — ABNORMAL LOW (ref 4.22–5.81)
RDW: 13.8 % (ref 11.5–15.5)
RDW: 13.8 % (ref 11.5–15.5)
WBC: 12.3 10*3/uL — ABNORMAL HIGH (ref 4.0–10.5)

## 2010-09-06 LAB — DIFFERENTIAL
Basophils Absolute: 0.1 10*3/uL (ref 0.0–0.1)
Basophils Relative: 0 % (ref 0–1)
Eosinophils Absolute: 0.2 10*3/uL (ref 0.0–0.7)
Eosinophils Relative: 2 % (ref 0–5)
Lymphocytes Relative: 19 % (ref 12–46)
Lymphs Abs: 2.1 10*3/uL (ref 0.7–4.0)
Lymphs Abs: 2.3 10*3/uL (ref 0.7–4.0)
Monocytes Absolute: 0.9 10*3/uL (ref 0.1–1.0)
Monocytes Absolute: 1 10*3/uL (ref 0.1–1.0)
Monocytes Relative: 8 % (ref 3–12)
Monocytes Relative: 8 % (ref 3–12)
Neutro Abs: 8.8 10*3/uL — ABNORMAL HIGH (ref 1.7–7.7)
Neutrophils Relative %: 73 % (ref 43–77)

## 2010-09-06 LAB — HEPARIN LEVEL (UNFRACTIONATED)
Heparin Unfractionated: 0.31 IU/mL (ref 0.30–0.70)
Heparin Unfractionated: 0.55 IU/mL (ref 0.30–0.70)

## 2010-09-06 LAB — CULTURE, BLOOD (ROUTINE X 2)
Culture: NO GROWTH
Culture: NO GROWTH

## 2010-09-06 LAB — APTT: aPTT: 46 seconds — ABNORMAL HIGH (ref 24–37)

## 2010-09-07 LAB — GLUCOSE, CAPILLARY: Glucose-Capillary: 205 mg/dL — ABNORMAL HIGH (ref 70–99)

## 2010-09-10 ENCOUNTER — Ambulatory Visit: Payer: Medicare Other | Admitting: Cardiology

## 2010-09-10 NOTE — Medication Information (Signed)
Summary: [name] appt /mt  Anticoagulant Therapy  Managed by: Seth Reams, RN, BSN Referring MD: Charlies Constable MD PCP: Seth Rod. Paz MD Supervising MD: Seth Kill MD, [name] Indication 1: Atrial Fibrillation Lab Used: [facility] Site: [address] INR POC 1.7 INR RANGE 2-3  Dietary changes: no    Health status changes: yes       Details: Cold symptoms x 3 weeks.  Bleeding/hemorrhagic complications: no    Recent/future hospitalizations: no    Any changes in medication regimen? yes       Details: OTC cold medication.  Recent/future dental: no  Any missed doses?: yes     Details: May have missed a dose  last week.   Is patient compliant with meds? yes       Allergies: 1)  ! Toprol Xl 2)  ! * Iv Potassium 3)  Actos (Pioglitazone Hcl)  Anticoagulation Management History:      The patient is taking warfarin and comes in today for a routine follow up visit.  Positive risk factors for bleeding include an age of 74 years or older and presence of serious comorbidities.  The bleeding index is 'intermediate risk'.  Positive CHADS2 values include History of CHF, History of HTN, and History of Diabetes.  Negative CHADS2 values include Age > 67 years old.  The start date was 07/30/2004.  His last INR was 2.5 ratio.  Anticoagulation responsible provider: Riley Kill MD, [name].  INR POC: 1.7.  Exp: 06/2011.    Anticoagulation Management Assessment/Plan:      The patient's current anticoagulation dose is Warfarin sodium 5 mg tabs: Use as directed by Anticoagulation Clinic.  The target INR is 2 - 3.  The next INR is due 09/23/2010.  Anticoagulation instructions were given to patient.  Results were reviewed/authorized by Seth Reams, RN, BSN.  He was notified by Seth Reams RN.         Prior Anticoagulation Instructions: INR 2.1  Continue same dose of 1 tablet every day except 2 tablets on Wednesday.  Recheck INR in 4 weeks.   Current Anticoagulation  Instructions: INR 1.7  Take 2 tablets today, then resume same dosage 1 tablet daily except 2 tablets on Wednesdays.

## 2010-09-17 ENCOUNTER — Encounter: Payer: Self-pay | Admitting: Cardiology

## 2010-09-26 ENCOUNTER — Encounter: Payer: Self-pay | Admitting: Cardiology

## 2010-09-26 ENCOUNTER — Ambulatory Visit (INDEPENDENT_AMBULATORY_CARE_PROVIDER_SITE_OTHER): Payer: Medicare Other | Admitting: *Deleted

## 2010-09-26 ENCOUNTER — Ambulatory Visit (INDEPENDENT_AMBULATORY_CARE_PROVIDER_SITE_OTHER): Payer: Medicare Other | Admitting: Cardiology

## 2010-09-26 DIAGNOSIS — I4891 Unspecified atrial fibrillation: Secondary | ICD-10-CM

## 2010-09-26 DIAGNOSIS — R0602 Shortness of breath: Secondary | ICD-10-CM

## 2010-09-26 DIAGNOSIS — I2510 Atherosclerotic heart disease of native coronary artery without angina pectoris: Secondary | ICD-10-CM

## 2010-09-26 DIAGNOSIS — I5032 Chronic diastolic (congestive) heart failure: Secondary | ICD-10-CM

## 2010-09-26 DIAGNOSIS — I509 Heart failure, unspecified: Secondary | ICD-10-CM

## 2010-09-26 DIAGNOSIS — I7389 Other specified peripheral vascular diseases: Secondary | ICD-10-CM

## 2010-09-26 DIAGNOSIS — E785 Hyperlipidemia, unspecified: Secondary | ICD-10-CM

## 2010-09-26 DIAGNOSIS — Z7901 Long term (current) use of anticoagulants: Secondary | ICD-10-CM | POA: Insufficient documentation

## 2010-09-26 DIAGNOSIS — Z954 Presence of other heart-valve replacement: Secondary | ICD-10-CM

## 2010-09-26 DIAGNOSIS — I6529 Occlusion and stenosis of unspecified carotid artery: Secondary | ICD-10-CM

## 2010-09-26 DIAGNOSIS — I359 Nonrheumatic aortic valve disorder, unspecified: Secondary | ICD-10-CM

## 2010-09-26 LAB — BASIC METABOLIC PANEL
BUN: 21 mg/dL (ref 6–23)
Calcium: 9.8 mg/dL (ref 8.4–10.5)
GFR: 81.53 mL/min (ref >60.00)
Glucose, Bld: 121 mg/dL — ABNORMAL HIGH (ref 70–99)
Sodium: 142 mEq/L (ref 135–145)

## 2010-09-26 MED ORDER — ASPIRIN EC 81 MG PO TBEC: 81.0000 mg | DELAYED_RELEASE_TABLET | Freq: Every day | ORAL | Status: DC

## 2010-09-26 NOTE — Patient Instructions (Signed)
Take 2 tablets today, then resume same dosage 1 tablet daily except 2 tablets on Wednesdays.  Recheck in 4 weeks.

## 2010-09-26 NOTE — Patient Instructions (Signed)
Decrease Aspirin to 81mg  daily. This should be enteric coated.  Lab today---BMP/BNP 428.32  414.08 424.1   Schedule an appointment for echocardiogram for June 2012.  Schedule an appointment for carotid doppler for June 2012.  Schedule an appointment for the device clinic for June 2012.  Schedule an appointment to see Dr Shirlee Latch in 4 months. ( August 2012)

## 2010-09-27 DIAGNOSIS — I779 Disorder of arteries and arterioles, unspecified: Secondary | ICD-10-CM

## 2010-09-27 HISTORY — DX: Disorder of arteries and arterioles, unspecified: I77.9

## 2010-09-27 NOTE — Progress Notes (Signed)
PCP: Dr. Drue Novel  76 yo with history of AS s/p AVR, diastolic CHF, CAD, chronic atrial fibrillation, s/p pacemaker presents for cardiology followup.  He has been seen by Dr. Juanda Chance in the past and is seen by me for the first time today. Patient has been stable from a cardiac perspective.  He is working part time at the [employer].  He has stable dyspnea after walking about 1 block on flat ground.  He is short of breath with steps.  No orthopnea, PND, or chest pain.  He has had these symptoms chronically.  He takes 160 mg Lasix in the morning and has been doing this long-term. Weight is down 4 lbs since prior appointment.  He is in chronic atrial fibrillation.  He has severe PAD and had a toe amputation in 8/11.  His 2nd left toe now has a black spot at the tip.  He is going to see Dr. Myra Gianotti about this on Monday.  He actually does not get pain in his calves with ambulation.  He hurts in his hips with walking and standing (this may be orthopedic).  Finally, patient had an echo this past summer showing moderate stenosis of his bioprosthetic aortic valve.   ECG: atrial fibrillation with  V-pacing  Labs (1/12): LDL 71, HDL 29, K 4.7, creatinine 1.1  PMH:  1. Aortic stenosis: s/p bioprosthetic aortic valve replacement in 06/2004.  Echo in 7/11 showed mean gradient 35 mmHg across the bioprosthetic valve, signifying a moderate degree of stenosis.  2. CAD: CABG with AVR in 1/06.  Patient had SVG-CFX and SVG-D.   3. Diabetes 4. Hyperlipidemia 5. HTN 6. Sick sinus syndrome with Medtronic dual chamber PCM 7. Obesity 8. OSA: Not using CPAP 9. Chronic diastolic CHF: Most recent echo in 7/11 showed EF 60-65% with moderate LV hypertrophy, moderate diastolic dysfunction, mild mitral stenosis, bioprosthetic aortic valve with mean gradient 35 mmHg (moderate stenosis) and PA systolic pressure 69 mmHg.  10. Nephrolithiasis 11. PAD: Has seen [facility].  Lower extremity angiography in 9/10 showed  occlusion of all 3 vessels below the knees in both legs.  Patient then apparently had femoral artery PCI by Dr. Myra Gianotti in 2011.  He had amputation of the left great toe in 8/11.  12. Atrial fibrillation: Chronic, on coumadin. 13. Carotid stenosis: carotid dopplers (7/11) with 40-59% bilateral ICA stenosis.   SH: Married, lives in [place].  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   FH: Father with MI at 71, mother with CVA at 84  ROS: All systems reviewed and negative except as per HPI.   Current Outpatient Prescriptions  Medication Sig Dispense Refill  . ezetimibe-simvastatin (VYTORIN) 10-40 MG per tablet Take 1 tablet by mouth at bedtime.        . furosemide (LASIX) 80 MG tablet 2 tabs in the am.       . insulin glargine (LANTUS) 100 UNIT/ML injection Inject 90 Units into the skin at bedtime.        . insulin glulisine (APIDRA) 100 UNIT/ML injection 8 units before breakfast, 14 units before lunch, 20 units before supper.  Sliding scale      . lisinopril-hydrochlorothiazide (PRINZIDE,ZESTORETIC) 20-12.5 MG per tablet Take 1 tablet by mouth daily.        . metFORMIN (GLUCOPHAGE-XR) 500 MG 24 hr tablet Take 1 tab in the morning and 1 tablet in the evening.       Marland Kitchen  multivitamin (THERAGRAN) per tablet Take 1 tablet by mouth daily.        Marland Kitchen spironolactone (ALDACTONE) 25 MG tablet Take 25 mg by mouth daily.        Marland Kitchen warfarin (COUMADIN) 5 MG tablet Take by mouth as directed.       Marland Kitchen aspirin EC 81 MG EC tablet Take 1 tablet (81 mg total) by mouth daily.    0    BP 126/70  Pulse 72  Ht 5\' 9"  (1.753 m)  Wt 248 lb (112.492 kg)  BMI 36.62 kg/m2 General: obese, NAD Neck: No JVD, no thyromegaly or thyroid nodule.  Lungs: Clear to auscultation bilaterally with normal respiratory effort. CV: Nondisplaced PMI.  Heart irregular S1/S2, no S3/S4, 3/6 mid-peaking systolic crescendo-decrescendo murmur RUSB.  S2 is heard  clearly.  1+ ankle edema.  Unable to palpate pedal pulses.  Bilateral carotid bruits. Abdomen: Soft, nontender, no hepatosplenomegaly, no distention.  Neurologic: Alert and oriented x 3.  Psych: Normal affect. Extremities: No clubbing or cyanosis.

## 2010-09-27 NOTE — Assessment & Plan Note (Signed)
Bioprosthetic aortic valve with moderate prosthetic stenosis on last echo in 7/11.  I will repeat the echo in 7/12 to assess for progression.

## 2010-09-27 NOTE — Assessment & Plan Note (Signed)
Severe PAD.  He has a dark area on the tip of his 2nd left toe worrisome for early gangrene. He will see Dr. Myra Gianotti on Monday.

## 2010-09-27 NOTE — Assessment & Plan Note (Signed)
Moderate carotid stenosis.  Check carotid dopplers in 7/12.

## 2010-09-27 NOTE — Assessment & Plan Note (Signed)
Chronic. Continue coumadin.  

## 2010-09-27 NOTE — Assessment & Plan Note (Signed)
Stable with no chest pain.  OK to decrease aspirin dose to 81 mg daily. Continue statin, ACEI.

## 2010-09-27 NOTE — Assessment & Plan Note (Signed)
LDL close to goal (< 70) when last checked in 1/12.

## 2010-09-27 NOTE — Assessment & Plan Note (Signed)
Diastolic CHF.  Patient has stable NYHA class III symptoms.  Neck veins not elevated so think volume is fairly stable as well.  He will continue his current Lasix regimen.  I will check a BMET and BNP today.

## 2010-09-30 ENCOUNTER — Ambulatory Visit: Payer: Medicare Other | Admitting: Surgery

## 2010-10-03 ENCOUNTER — Encounter: Payer: Self-pay | Admitting: Cardiology

## 2010-10-07 ENCOUNTER — Encounter: Payer: Self-pay | Admitting: Internal Medicine

## 2010-10-07 DIAGNOSIS — I4891 Unspecified atrial fibrillation: Secondary | ICD-10-CM

## 2010-10-10 ENCOUNTER — Ambulatory Visit (INDEPENDENT_AMBULATORY_CARE_PROVIDER_SITE_OTHER): Payer: Medicare Other | Admitting: Internal Medicine

## 2010-10-10 ENCOUNTER — Encounter: Payer: Self-pay | Admitting: Internal Medicine

## 2010-10-10 ENCOUNTER — Other Ambulatory Visit: Payer: Medicare Other | Admitting: *Deleted

## 2010-10-10 DIAGNOSIS — Z0000 Encounter for general adult medical examination without abnormal findings: Secondary | ICD-10-CM | POA: Insufficient documentation

## 2010-10-10 DIAGNOSIS — R972 Elevated prostate specific antigen [PSA]: Secondary | ICD-10-CM

## 2010-10-10 DIAGNOSIS — Z125 Encounter for screening for malignant neoplasm of prostate: Secondary | ICD-10-CM

## 2010-10-10 DIAGNOSIS — E119 Type 2 diabetes mellitus without complications: Secondary | ICD-10-CM

## 2010-10-10 DIAGNOSIS — I2510 Atherosclerotic heart disease of native coronary artery without angina pectoris: Secondary | ICD-10-CM

## 2010-10-10 DIAGNOSIS — E785 Hyperlipidemia, unspecified: Secondary | ICD-10-CM

## 2010-10-10 DIAGNOSIS — I10 Essential (primary) hypertension: Secondary | ICD-10-CM

## 2010-10-10 LAB — CBC WITH DIFFERENTIAL/PLATELET
Basophils Relative: 0.3 % (ref 0.0–3.0)
Eosinophils Relative: 2 % (ref 0.0–5.0)
Lymphocytes Relative: 18.4 % (ref 12.0–46.0)
Neutrophils Relative %: 72 % (ref 43.0–77.0)
RBC: 4.78 Mil/uL (ref 4.22–5.81)
WBC: 12.1 10*3/uL — ABNORMAL HIGH (ref 4.5–10.5)

## 2010-10-10 LAB — PSA: PSA: 1.32 ng/mL (ref 0.10–4.00)

## 2010-10-10 NOTE — Assessment & Plan Note (Signed)
See history of present illness, does self  insulin management with a wide variation of lantus dose. Recommended against that practice, he become upset:  "I know my body, I have been doing this x long time, no doctor in 15 years has gotten me better than what I'm doing now" Explaining the need to get good control of diabetes given his other medical problems. Labs Consider a endocrinology referral although I'm not sure if he will do it. He has a history of toe amputation and peripheral neuropathy. Discussed feet  care.

## 2010-10-10 NOTE — Progress Notes (Signed)
Subjective:    Patient ID: [name], male    DOB: 08-07-1934, 76 y.o.   MRN: [id number]  HPI CPX, chart reviewed DM-- "I take lantus anywhere from 50 to 85 units depending on my sugar. I have cut down epidra, take it 01-31-11 units with some meals" ; reports occasional low CBGs  HTN-- no amb BPs  OSA on no CPAP CAD-- just saw his new cardiologist Dx w/ skin cancer , recently had surgery  Past Medical History  Diagnosis Date  . Erectile dysfunction   . Obesity   . OSA (obstructive sleep apnea)     not using CPAP  . History of nephrolithiasis   . DM type 2 (diabetes mellitus, type 2)     used to see Dr Roanna Raider, now f/u by Dr Drue Novel  . HTN (hypertension)   . Aortal stenosis 2006    biophrostetic  . Atrial fibrillation     onset after CABG--- [facility]  . S/P aortic valve replacement   . CHF (congestive heart failure)     abter CABG  . Pacemaker 2/07    VVI  . PVD (peripheral vascular disease)     CT angio showed- vascular insuff, intestine and RAS bilaterally, andgiogram 02/2009 severe PVD medical management  . Nephrolithiasis   . Skin cancer     surgery (nose) 09-2010   Past Surgical History  Procedure Date  . Aortic valve replacement     biophrostetic for Ao Stenosis 2006  . Coronary artery bypass graft 2006  . Tonsillectomy   . Cataract extraction, bilateral   . Amputation 01/2010    great toe  . Pacemaker placement 07/2005    Medtronic   Family History  Problem Relation Age of Onset  . Heart attack Father 65  . Stroke Mother 5  . Heart disease Sister 78     also stomach aneurysm  . Cancer Neg Hx     colon, prostate  . Diabetes Sister     and/or heart problems  . Diabetes Sister     and/or heart problems  . Diabetes Sister     and/or heart problems   History   Social History  . Marital Status: Married    Spouse Name: N/A    Number of Children: 2  . Years of Education: N/A   Occupational History  . retired- long  distance Naval architect     Social History Main Topics  . Smoking status: Former Smoker    Quit date: 06/23/1988  . Smokeless tobacco: Not on file  . Alcohol Use: No  . Drug Use: No  . Sexually Active: Not on file   Other Topics Concern  . Not on file   Social History Narrative   Diet-- improved ...Marland KitchenMarland KitchenExercise- some, limited by pain 2 step children      Review of Systems  Constitutional:       Diet, exercise: improved   Respiratory: Negative for shortness of breath. Cough: occ cough, thinks d/t allergies    Cardiovascular: Negative for chest pain, palpitations and leg swelling.  Gastrointestinal: Negative for nausea, vomiting, abdominal pain, diarrhea and blood in stool.  Genitourinary: Negative for dysuria, urgency and hematuria.       Slow flow, nothing new       Objective:   Physical Exam  Constitutional: He appears well-developed.       Overweight  HENT:  Head: Normocephalic and atraumatic.  Neck: No JVD present. No thyromegaly present.  Cardiovascular: Normal rate and regular rhythm.        + systolic murmur  Pulmonary/Chest: Effort normal and breath sounds normal. No respiratory distress. He has no wheezes. He has no rales.  Genitourinary: Prostate normal.  Musculoskeletal:       Lower extremities with no edema. Decrease pedal pulses Diabetic foot exam shows a left great toe amputation. He has a patchy decrease in the pin prick feeling in the feet    Skin: Skin is warm and dry.  Psychiatric: He has a normal mood and affect. His behavior is normal. Judgment and thought content normal.          Assessment & Plan:

## 2010-10-10 NOTE — Assessment & Plan Note (Signed)
Well controlled, last BMP normal. No change 

## 2010-10-10 NOTE — Assessment & Plan Note (Signed)
Asymptomatic, closely follow up by cardiology

## 2010-10-10 NOTE — Assessment & Plan Note (Addendum)
Well-controlled . HDL was low. He is doing better with diet and exercise, hopefully that will help with the HDL.Marland Kitchen No change

## 2010-10-10 NOTE — Assessment & Plan Note (Addendum)
Td 2002 Pneumonia shot 2009 Cscope 12-2000 --polyps Shingles shot discussed  Cscope November 30 2003---small polyps next colonoscopy 2015  DRE done today Check a PSA to rule out elevated PSA

## 2010-10-15 ENCOUNTER — Telehealth: Payer: Self-pay | Admitting: *Deleted

## 2010-10-15 DIAGNOSIS — E1165 Type 2 diabetes mellitus with hyperglycemia: Secondary | ICD-10-CM

## 2010-10-15 DIAGNOSIS — D72829 Elevated white blood cell count, unspecified: Secondary | ICD-10-CM

## 2010-10-15 NOTE — Telephone Encounter (Signed)
I spoke w/ pt he is aware, he is willing to see specialist.

## 2010-10-15 NOTE — Telephone Encounter (Signed)
Message copied by Army Fossa on Tue Oct 15, 2010 11:36 AM ------      Message from: Willow Ora      Created: Tue Oct 15, 2010  8:35 AM       Advise patient:      His diabetes is not as well-controlled as last year. I recommend a endocrinologist referral. Please arrange if the patient is willing to go.      His WBC's are now chronically elevated. Needs to see hematology. Please arrange.      All other labs within normal.

## 2010-10-24 ENCOUNTER — Ambulatory Visit (INDEPENDENT_AMBULATORY_CARE_PROVIDER_SITE_OTHER): Payer: Medicare Other | Admitting: *Deleted

## 2010-10-24 DIAGNOSIS — I359 Nonrheumatic aortic valve disorder, unspecified: Secondary | ICD-10-CM

## 2010-10-24 DIAGNOSIS — I4891 Unspecified atrial fibrillation: Secondary | ICD-10-CM

## 2010-10-31 ENCOUNTER — Other Ambulatory Visit: Payer: Self-pay | Admitting: Cardiology

## 2010-11-04 ENCOUNTER — Other Ambulatory Visit: Payer: Self-pay | Admitting: *Deleted

## 2010-11-04 MED ORDER — WARFARIN SODIUM 5 MG PO TABS: 5.0000 mg | ORAL_TABLET | ORAL | Status: DC

## 2010-11-05 NOTE — Assessment & Plan Note (Signed)
[facility]                            CARDIOLOGY OFFICE NOTE   NAME:[name], [name]                       MRN:          [id number]  DATE:04/14/2007                            DOB:          [birth year]    PRIMARY CARE PHYSICIAN:  Willow Ora, M.D.   CLINICAL HISTORY:  [name] is 76 years old and returns for management  of his coronary heart disease and congestive heart failure.  He had  aortic valve replacement for aortic stenosis and coronary bypass surgery  in 2006.  Postoperatively, he had heart failure related to diastolic  dysfunction and atrial fibrillation which became chronic.  He  subsequently had a VVI pacemaker implanted for bradycardia.  He has done  quite well over the last year.  He says he has had no recent chest pain  or palpitations.  His exercise tolerance has remained reasonably good  despite his excess weight.  He did say he developed swelling one night  when he forgot to take his Lasix but otherwise he has had no fluid  accumulation that he can tell.   PAST MEDICAL HISTORY:  1. Insulin-dependent diabetes.  2. Hypertension.  3. Hyperlipidemia.   CURRENT MEDICATIONS:  Lisinopril/hydrochlorothiazide, Vytorin, Coumadin,  NovoLog, Lantus, Glucophage, aspirin, potassium, and Lasix.   PHYSICAL EXAMINATION:  VITAL SIGNS:  Blood pressure is 117/73, pulse 69  and regular.  NECK:  There was no venous distention.  Carotid pulses were full without  bruits.  CHEST:  Clear.  HEART:  Rhythm was regular.  I could hear no murmurs and no gallops.  ABDOMEN:  Protuberant.  Bowel sounds were normal.  EXTREMITIES:  There was trace peripheral edema.  Pedal pulses were  equal.   We interrogated his pacemaker and he had good thresholds on the  ventricular lead and was not pacer dependent.  His rate response was  reasonably good.   IMPRESSION:  1. Valvular heart disease, status post aortic valve replacement for      aortic stenosis in January  2006.  2. Coronary artery disease, status post coronary bypass graft surgery,      January 2006.  3. Congestive heart failure related to diastolic dysfunction, now      compensated, Class II.  4. Chronic atrial fibrillation.  5. Status post Medtronic VVI pacemaker.  6. Insulin-dependent diabetes.  7. Hypertension.  8. Hyperlipidemia.   RECOMMENDATIONS:  [name] has done amazingly well.  He appears  euvolemic and is not having any anginal symptoms.  We will plan to  continue his same medications.  He just recently had lab work by Dr.  Roanna Raider.  I will plan to see him back in followup in 6 months.     Bruce Elvera Lennox Juanda Chance, MD, [facility]  Electronically Signed    BRB/MedQ  DD: 04/14/2007  DT: 04/15/2007  Job #: [id number]   cc:   Ocie Cornfield

## 2010-11-05 NOTE — Procedures (Signed)
LOWER EXTREMITY ARTERIAL DUPLEX   INDICATION:  Followup left lower extremity stent/PTA.   HISTORY:  Diabetes:  Yes.  Cardiac:  CHF.  Hypertension:  Yes.  Smoking:  Previous.  Previous Surgery:  Left superficial femoral artery stent, left anterior  tibial artery PTA on 02/10/2010, left great toe amputation on  02/14/2010.   SINGLE LEVEL ARTERIAL EXAM                          RIGHT                LEFT  Brachial:  Anterior tibial:  Posterior tibial:  Peroneal:  Ankle/Brachial Index:   LOWER EXTREMITY ARTERIAL DUPLEX EXAM   DUPLEX:  Triphasic Doppler waveforms noted throughout the left femoral,  superficial femoral and popliteal arteries with no increase in  velocities noted.  There is a velocity of 200 cm/s noted at the left  proximal anterior tibial artery level.   IMPRESSION:  1. Patent left superficial femoral artery stent with no evidence of      stenosis.  2. Elevated velocity of the left anterior tibial artery is noted as      described above.  3. Limited visualization of the left lower extremity arterial system      due to calcific vessel shadowing.        ___________________________________________  V. Charlena Cross, MD   CH/MEDQ  D:  04/30/2010  T:  04/30/2010  Job:  [id number]

## 2010-11-05 NOTE — Assessment & Plan Note (Signed)
[facility]                          CHRONIC HEART FAILURE NOTE   NAME:[name], [name]                       MRN:          [id number]  DATE:04/10/2008                            DOB:          16-Oct-1934    PRIMARY CARE PHYSICIAN:  Willow Ora, MD   CARDIOLOGIST:  Everardo Beals. Juanda Chance, MD, [facility]   INTERVAL HISTORY:  [name] is a 76 year old male with a history of  coronary artery disease status post bypass in 2006 as well as an aortic  valve replacement and chronic atrial fibrillation.  He has been followed  closely by Dr. Juanda Chance.  He has also been seen by Dorian Pod in the  Heart Failure Clinic.  He was accidentally placed on my schedule today  as per the Heart Failure Clinic.   Remainder of his medical history is notable for morbid obesity and  obstructive sleep apnea with inability to tolerate CPAP as well as  diabetes.   Overall, he states he is doing fairly well.  He does have chronic  dyspnea on exertion, but this is totally unchanged.  He feels like his  belly has been a little bit more bloated, but he thinks this may be due  just to not pushing back from the table and he also has a trivial lower  extremity edema, but no orthopnea or PND.  He has been fairly compliant  with all his medications, but he has not taken them this morning.  He  was recently started on spironolactone several months ago.   CURRENT MEDICATIONS:  1. Vytorin 10/40.  2. Coumadin.  3. NovoLog insulin.  4. Lantus insulin.  5. Glucophage 500 b.i.d.  6. Aspirin 81 a day.  7. Lisinopril 20/12.5 b.i.d.  8. Spironolactone 25 b.i.d.  9. Lasix 160 in the morning and 80 at night.   PHYSICAL EXAMINATION:  GENERAL:  He is ambulatory in the clinic without  any respiratory difficulty.  VITAL SIGNS:  Blood pressure is 162/78, heart rate 75, weight is 285  which is up 8 pounds.  HEENT:  Normal.  NECK:  Thick and supple.  It is very hard to assess his venous pressure,  appears  normal.  Carotids are 2+ bilaterally with no obvious bruits.  There is no lymphadenopathy or thyromegaly.  CARDIAC:  PMI is nonpalpable.  He is regular with no murmurs, rubs, or  gallops.  Soft systolic ejection murmur at the left sternal border.  LUNGS:  Clear.  No wheezing.  ABDOMEN:  Markedly obese and unable to palpate for any  hepatosplenomegaly.  He is nontender.  Hypoactive bowel sounds.  EXTREMITIES:  Warm with no cyanosis or clubbing.  There is trace edema.  Good pulses.  NEURO:  Alert and oriented x3.  Cranial nerves II through XII are  intact, moves all 4 extremities without difficulty.  Affect is pleasant.   ASSESSMENT AND PLAN:  1. Diastolic heart failure.  This seems fairly well controlled.      Volume status looks okay.  I did tell him that he could take an      extra dose  of Lasix as needed for increased swelling.  I also had a      brief talk with him about controlling his risk factors for      diastolic heart failure including his weight, watching his fluid      intake, and keeping his blood pressure down.  2. Hypertension.  Blood pressure is elevated in the setting of not      taking his medicines this morning.  I suspect he will need another      agent, possibly a Coreg or Norvasc.  I will leave this to the      discretion of Dr. Juanda Chance.   DISPOSITION:  He may be a candidate for upcoming diastolic heart failure  trial with [employer].  I will discuss this with Dr. Juanda Chance.     Bevelyn Buckles. Bensimhon, MD  Electronically Signed    DRB/MedQ  DD: 04/10/2008  DT: 04/11/2008  Job #: [id number]

## 2010-11-05 NOTE — Assessment & Plan Note (Signed)
OFFICE VISIT   [name], [name]  DOB:  1934-12-14                                       04/29/2010  ZOXWR#:[id number]   The patient comes back today for followup.  He is status post left great  toe amputation as well as stenting of his left superficial femoral  artery and angioplasty of his left anterior tibial artery.  His toe is  healing nicely.  I did debride some superficial sloughing.  He has  healthy pink granulation tissue underneath.  Ultrasound was performed  today that showed no evidence of stenosis within his stent.  There are  elevated velocities in the proximal left anterior tibial artery  measuring 200 cm/sec.   I will plan on seeing him back in 6 weeks.  We will continue with local  wound care.  He is on our stent protocol for followup ultrasound.     [name], MD  Electronically Signed   VWB/MEDQ  D:  04/29/2010  T:  04/29/2010  Job:  [id number]

## 2010-11-05 NOTE — Assessment & Plan Note (Signed)
[facility]                            CARDIOLOGY OFFICE NOTE   NAME:[name], [name]                       MRN:          [id number]  DATE:06/06/2008                            DOB:          [birth year]    PRIMARY CARE PHYSICIAN:  Willow Ora, MD   CLINICAL HISTORY:  [name] is 76 year old who returned for followup  management of his coronary disease and diastolic heart failure.  He had  coronary bypass graft surgery and aortic valve replacement for aortic  stenosis in 2006.  He has had a marked LV hypertrophy and has had  diastolic heart failure associated with this.  He also has a chronic  atrial fibrillation and is maintained on rate control medications and  Coumadin.  He has a VVI pacemaker in place.   He states he has been doing fairly well.  Does get some slight swelling.  Occasionally takes an extra Lasix.  He says he occasionally misses Lasix  and he has taken later in the day because he is working sometimes in the  morning.  He has had no chest pain or palpitations.   His past medical history is significant for insulin diabetes,  hypertension, and hyperlipidemia.   CURRENT MEDICATIONS:  1. Vytorin 10/40.  2. Coumadin.  3. NovoLog.  4. Lantus.  5. Glucophage.   SOCIAL HISTORY:  [name] is married.  He does not smoke.  He is  working 1 or 2 days a week at [employer].   PHYSICAL EXAMINATION:  VITAL SIGNS:  Blood pressure was 136/74 and the  pulse 70 and regular.  HEENT:  There was no venous tension.  Carotid pulses were full without  bruits.  CHEST:  Clear without rales or rhonchi.  HEART:  Rhythm is regular.  There is a grade 1/6 systolic murmur at left  sternal edge.  I could hear no diastolic murmur.  ABDOMEN:  Protuberant.  There was normal bowel sounds.  The abdomen was  soft.  There is no hepatosplenomegaly.  EXTREMITIES:  There is 1+ peripheral edema.  Pedal pulses are equal.   IMPRESSION:  1. Diastolic heart failure now  fairly well compensated and close to      euvolemic.  2. Coronary artery disease status post coronary bypass graft surgery      2006.  3. Valvular heart disease status post pericardial valve replacement      for aortic stenosis 2006.  4. Obstructive sleep apnea - unable to take CPAP.  5. Obesity.  6. Chronic atrial fibrillation.  7. Status post VVI pacemaker implant.  8. Diabetes.  9. Hypertension.  10.Hyperlipidemia.   RECOMMENDATIONS:  I think [name] is doing well.  We will not change  his medications.  We will get BMP and CBC today.  I will see him back in  followup in 4 months.     Bruce Elvera Lennox Juanda Chance, MD, [facility]  Electronically Signed    BRB/MedQ  DD: 06/06/2008  DT: 06/06/2008  Job #: [id number]

## 2010-11-05 NOTE — Assessment & Plan Note (Signed)
[facility]                            CARDIOLOGY OFFICE NOTE   NAME:[name], [name]                       MRN:          [id number]  DATE:02/14/2008                            DOB:          01-28-1935    PRIMARY CARE PHYSICIAN:  Willow Ora, MD   CLINICAL HISTORY:  [name] is a 76 year old who returns for followup  __________ he has had aortic valve replacement, coronary artery bypass  surgery in 2006, with a prognosis of severe 3-vessel disease.  __________.   PAST MEDICAL HISTORY:  Significant for insulin-dependent diabetes,  hypertension, and hyperlipidemia.   CURRENT MEDICATIONS:  Vytorin, Coumadin, NovoLog, Lantus insulin,  Glucophage, aspirin, lisinopril, spironolactone, and furosemide 80, two  in the morning and one in the evening.  He is on spironolactone 25  b.i.d.   PHYSICAL EXAMINATION:  VITAL SIGNS:  The blood pressure is 150/76 and  the pulse is 65 and regular.  NECK:  There was venous distention.  The carotid pulses were full  without bruits.  CHEST:  Clear without rales or rhonchi.  CARDIAC:  Rhythm was regular.  There was a soft systolic murmur at the  left sternal edge.  ABDOMEN:  Soft with normal bowel sounds.  The abdomen is protuberant.  There was trace peripheral edema and pedal pulses were equal.   An electrocardiogram showed VVI pacing.  In interrogated his pacemaker  and he was not pacer dependent __________ of about 30.  He did have one  episode __________ ventricular rate and rhythm 200 beats per minute.   IMPRESSION:  1. Diastolic heart failure, now improved and appears euvolemic.  2. Coronary artery disease status post coronary bypass graft surgery      in 2006.  3. Valvular heart disease status post adverse pericardial valve      replacement for aortic stenosis in 2006, maximum good early      systolic function with left ventricular hypertrophy.  4. Obstructive sleep apnea with no component of CPAP,  5.  Obesity.  6. Chronic atrial fibrillation.  7. Status post VVI pacemaker implant in the patient.  8. Diabetes.  9. Hypertension.  10.Hyperlipidemia.   RECOMMENDATIONS:  [name] is doing well.  The blood pressure  __________ or reprogram his pacemaker.  __________ on his next reading  if he has any recurrence __________.     Bruce Elvera Lennox Juanda Chance, MD, [facility]  Electronically Signed    BRB/MedQ  DD: 02/14/2008  DT: 02/15/2008  Job #: [id number]

## 2010-11-05 NOTE — Assessment & Plan Note (Signed)
OFFICE VISIT   ROBB, [name]  DOB:  11/13/1934                                       03/18/2010  [id number]#:[id number]   Patient comes back in today for follow-up.  He has undergone stenting of  his left SFA and angioplasty of his left anterior tibial as well as a  toe amputation.  He is getting his toe treated with a wound VAC as well  as follow up with the wound center.  His toe is healing nicely.   He got a duplex today which only showed monophasic waveforms.  His  vessels were noncompressible.   I am going to have him come back to see me in 6 weeks.  We will get a  formal duplex to make sure that his percutaneous intervention is  continuing to function.     [name], MD  Electronically Signed   VWB/MEDQ  D:  03/18/2010  T:  03/19/2010  Job:  [id number] [id number]

## 2010-11-05 NOTE — Assessment & Plan Note (Signed)
OFFICE VISIT   MCIHAEL, [name]  DOB:  1934/11/29                                       05/20/2010  [id number]#:[id number]   [name] comes back today for follow-up.  He is status post left great  toe amputation as well as left superficial femoral artery stenting and  angioplasty of his left anterior tibial artery.  His toe is healing  nicely.  I did a dressing change today.  There is no evidence of  infection.  The wound is nearly closed.  I plan on seeing him back in  6  weeks for another wound check.  He will continue with hydrogel dressing  changes.  He also needs to have his ultrasound repeated in the coming  months.     [name], MD  Electronically Signed   VWB/MEDQ  D:  05/20/2010  T:  05/20/2010  Job:  [phone] [id number]

## 2010-11-05 NOTE — Assessment & Plan Note (Signed)
[facility]                          CHRONIC HEART FAILURE NOTE   NAME:[name], [name]                       MRN:          [id number]  DATE:11/11/2007                            DOB:          [birth year]    PRIMARY CARDIOLOGIST:  Charlies Constable, MD.   PRIMARY CARE:  Willow Ora, MD   ENDOCRINOLOGIST:  Ocie Cornfield, MD   [name] returns today for followup of his congestive heart failure,  which is secondary to diastolic dysfunction.  The patient's heart  failure most likely also has an ischemic component as well.  [name]  is relatively new to the heart failure clinic; I saw him back on May 1  for initial evaluation.  We had a long talk about his dietary habits and  sleep apnea and diabetes. I had strongly encourage [name] to follow  up for further evaluation of his sleep apnea and he stated he would  think about it.  He returns today and states he has thought about it and  he has no intention of trying it again, that he sleeps just fine without  it.  He does still complaining of snoring, but states with the nasal  airway passage being blocked on one side, that he cannot tolerate the  face mask or the oxygen, that he does not get any rest when he wears it,  so he does not want to discuss it again.  Also, in talking with him  about his diet since I saw him, he states he is doing better.  He has  only had 1 Whopper in the last 3 weeks.  I had also asked him to  increase his Lasix to 160 mg b.i.d. when I last saw him earlier this  month; the patient states he still taking 160 in the morning and he is  just taking 80 in the afternoon because if he takes more than 80, he  states he will be up all night urinating, even though he takes the Lasix  around 4 o'clock.  Otherwise, [name] states he is feeling good, much  better than previously.  He is still short of breath with minimal  exertion.  He states his weight at home fluctuates about 5 pounds, but  has done so for a long period of time.  He states compliance with other  medications.  He underwent a 2-D echocardiogram this morning also.   PAST MEDICAL HISTORY:  1. Congestive heart failure secondary to diastolic dysfunction with a      component of ischemic disease also.  Last echocardiogram done      September 2007 showed a normal EF.  2. Coronary artery disease, status post CABG x2 in 2006.  3. Aortic valve replacement, using a Edwards pericardial tissue valve.  4. Chronic anticoagulation therapy.  5. Obstructive sleep apnea with noncompliance to CPAP and oxygen.  6. Morbid obesity.  7. Chronic atrial fibrillation.  8. Diabetes.  9. Peripheral claudication.  10.Hypertension.  11.Dyslipidemia.  12.History of sick sinus syndrome, status post implantation of      Medtronic Sigma VVI generator.  13.History of tobacco use.   REVIEW OF SYSTEMS:  As stated above.   DRUG ALLERGIES/INTOLERANCES:  Include IVP DYE and TOPROL-XL.   CURRENT MEDICATIONS:  1. Aldactone 25 mg daily.  2. K-Dur 10 mEq three tablets b.i.d.  3. Vytorin 10/40 daily.  4. Coumadin as directed.  5. NovoLog as directed.  6. Lantus as directed.  7. Glucophage 500 mg b.i.d.  8. Aspirin 81 mg daily.  9. Lisinopril 20/12.5 mg b.i.d.  10.Lasix 160 in the morning and 80 in the evening.   PHYSICAL EXAM:  Weight today 271 pounds, blood pressure 158/82 with a  heart rate of 77.  [name] is in no acute distress.  No obvious signs of JVD at a 45-degree angle secondary to difficulty of  evaluating secondary to body habitus.  LUNGS:  Reasonably clear throughout.  Distant breath sounds with poor  expiratory effort.  CARDIOVASCULAR:  Exam reveals S1 and S2.  ABDOMEN:  Protuberant, soft, nontender.  Positive bowel sounds.  LOWER EXTREMITIES:  With 1+ pitting edema bilaterally.  NEUROLOGICAL:  Alert and oriented x3.   IMPRESSION:  Congestive heart failure that is diastolic in nature, still  with mild signs of volume  overload at this time.  The patient does not  want to increase his Lasix dose.  The patient is also hypertensive.  We  will increase his spironolactone to 25 mg b.i.d. and decrease his  potassium to 10 mEq b.i.d.  He will need repeat blood work when he  returns.  Preliminary echocardiogram today showed normal left  ventricular function with mild mitral valve regurgitation; however, this  is a preliminary report; we will wait for final reading.     Dorian Pod, ACNP  Electronically Signed      Rollene Rotunda, MD, [facility]  Electronically Signed   MB/MedQ  DD: 11/11/2007  DT: 11/11/2007  Job #: [id number]   cc:   Seth Gutter, MD

## 2010-11-05 NOTE — Assessment & Plan Note (Signed)
[facility]                          CHRONIC HEART FAILURE NOTE   NAME:[name], [name]                       MRN:          [id number]  DATE:01/13/2008                            DOB:          [birth year]    PRIMARY CARDIOLOGIST:  Everardo Beals. Juanda Chance, MD, [facility]   PRIMARY CARE:  Willow Ora, MD   ENDOCRINOLOGY:  Ocie Cornfield.   [name] returns today for followup of his congestive heart failure,  which is secondary to diastolic dysfunction.  When I last saw him in  June, he was doing quite well.  He had signs of mild volume overload,  but had not taken his medications yet that day and had had some dietary  indiscretion with his sodium intake.  Today, he states he has taken his  medications.  He denies any shortness of breath or any signs of volume  overload.  He has been working in his garden without any problems.  He  had some recent lesions removed from his right forearm and his face by  Dermatology.  He states he was told that the biopsies were negative and  has no complaints.   PAST MEDICAL HISTORY:  1. Congestive heart failure secondary to diastolic dysfunction with a      normal EF.  2. Coronary artery disease status post CABG.  3. Aortic valve replacement with Edwards pericardial tissue valve.  4. Obstructive sleep apnea with ongoing noncompliance to CPAP.  5. Chronic atrial fib.  6. Morbid obesity.   PHYSICAL EXAMINATION:  VITAL SIGNS:  Weight 273 pounds, which is  consistent.  Blood pressure 130/74 with heart rate of 86.  NECK:  He has no signs of jugular vein distention at 90-degree angle.  LUNGS:  Clear to auscultation bilaterally.  CARDIOVASCULAR:  S1 and S2.  Regular rate and rhythm.  He has a 2/6  systolic ejection murmur noted.  ABDOMEN:  Protuberant, soft, positive bowel sounds.  EXTREMITIES:  Lower extremities with a trace of edema to the right lower  extremity.  Otherwise without clubbing, cyanosis.  NEUROLOGIC:  Alert and oriented  x3.   IMPRESSION:  Congestive heart failure secondary to diastolic  dysfunction.  The patient is at baseline.  We will continue current  medications and see him back in 3 months or sooner if he has any further  problems.   MEDICATIONS:  1. Vytorin 10/40 daily.  2. Coumadin as directed.  3. NovoLog as directed.  4. Lantus as directed.  5. Glucophage 500 b.i.d.  6. Aspirin 81 daily.  7. Lisinopril 20/12.5 mg b.i.d.  8. Lasix 160 in the morning, 80 in the evening.  9. Spironolactone 25 mg b.i.d.   Last lab work was in May 2009.  We will need to repeat blood work at  followup appointment.      Dorian Pod, ACNP  Electronically Signed      Bevelyn Buckles. Bensimhon, MD  Electronically Signed   MB/MedQ  DD: 01/13/2008  DT: 01/14/2008  Job #: [id number]   cc:   Ocie Cornfield

## 2010-11-05 NOTE — Assessment & Plan Note (Signed)
[facility]                          CHRONIC HEART FAILURE NOTE   NAME:[name], [name]                       MRN:          [id number]  DATE:12/17/2007                            DOB:          1935-01-18    PRIMARY CARDIOLOGIST:  Everardo Beals. Juanda Chance, MD, [facility]   PRIMARY CARE PHYSICIAN:  Willow Ora, MD   ENDOCRINOLOGIST:  Girtha Hake. Doerr, MD   [name] returns today for followup of his congestive heart failure,  which is secondary to diastolic dysfunction.  I last saw him back in May  2009; at which time, I made some mild adjustments in the patient's  medication, and checked an echocardiogram.  Mr. Nickolson echocardiogram  showed an EF of 60-65% and trivial tricuspid valvular regurgitation.  The patient's estimated peak pulmonary artery systolic pressure was  mildly-to-moderately increased.  Since that time, [name] is followed  up with Dr. Juanda Chance for routine cardiology visit, and today, he has  actually seen Dr. Drue Novel for complete physical exam.  [name] states he  has been doing well.  He has not taking any of his medications yet  today.  His blood pressure is up and he has some peripheral edema on  him.  He states that he also ate a big plate of spaghetti last night  with homemade sauce.  He states he does not add salt to anything he  eats, but if they have salt in it then so be it.  He denies any  orthopnea or PND.  He states he has been feeling good.   PAST MEDICAL HISTORY:  1. Congestive heart failure secondary to diastolic dysfunction with a      normal EF.  2. Coronary artery disease status post CABG in 2006.  3. Aortic valve replacement with Edwards pericardial tissue valve.  4. Obstructive sleep apnea with noncompliance to CPAP.  5. Obesity.  6. Chronic atrial fib.  7. Diabetes.  8. Hypertension.  9. Hyperlipidemia.  10.Chronic anticoagulation therapy.  11.Status post implantation of a Medtronic Sigma VVI generator.  12.History of tobacco  use.   REVIEW OF SYSTEMS:  As stated above, otherwise negative.   CURRENT MEDICATIONS:  1. Vytorin 10/40 mg daily.  2. Coumadin, per [facility].  3. NovoLog sliding scale insulin, as directed.  4. Lantus 80, I believe the patient has taken 85 units' at bedtime or      as directed by Dr. Roanna Raider.  5. Glucophage 500 mg b.i.d.  6. Aspirin 81 mg daily.  7. Lisinopril 20/12.5 mg b.i.d.  8. Lasix 160 mg in the morning and 80 mg in the evening.  9. Spironolactone 25 mg b.i.d.   PHYSICAL EXAMINATION:  VITAL SIGNS:  Weight 274, weight is up 4 pounds  and blood pressure 149/75 with a heart rate of 75.  Note, the patient  has not taken any of his medications yet today.  GENERAL:  [name] is in no acute distress.  NECK:  No signs of jugular vein distention.  LUNGS:  Clear to auscultation bilaterally.  CARDIOVASCULAR:  S1 and S2.  ABDOMEN:  Obese,  soft, and nontender.  Positive bowel sounds.  EXTREMITIES:  Lower extremities with 1+ pitting edema bilaterally with  the left being greater than the right.  NEUROLOGIC:  Alert and oriented x3.  Ambulating in clinic without  assistance.   IMPRESSION:  Congestive heart failure with signs of mild volume overload  at this time.  Mr. Thone has been instructed to take his diuretics as  soon as he returns home.  Once again, sodium restriction education has  been reinforced.  We will continue to monitor Mr. Jarnigan from a distance.      Dorian Pod, ACNP  Electronically Signed      Rollene Rotunda, MD, [facility]  Electronically Signed   MB/MedQ  DD: 12/17/2007  DT: 12/18/2007  Job #: [id number]   cc:   Max Sane Juanda Chance, MD, [facility]  Willow Ora, MD

## 2010-11-05 NOTE — Assessment & Plan Note (Signed)
[facility]                            CARDIOLOGY OFFICE NOTE   NAME:[name], Seth REDNER                       MRN:          [id number]  DATE:11/18/2007                            DOB:          1934/10/08    PRIMARY CARE PHYSICIAN:  Willow Ora, MD.   CLINICAL HISTORY:  [name] returned for followup and management of  his coronary artery disease and congestive heart failure.  He has had  aortic valve replacement and had coronary bypass surgery in 2006 for  aortic stenosis and three-vessel coronary disease.  He has had diastolic  heart failure and I saw him in early April with increasing volume  overload.  We increased his Lasix.  He was subsequently seen by Dorian Pod who further titrated his Lasix.  We also added Aldactone.  He has  lost about 10 pounds over these last 6 weeks.  He says he feels better  and feels back to baseline.   PAST MEDICAL HISTORY:  1. Significant for insulin-dependent diabetes.  2. Hypertension.  3. Hyperlipidemia.  4. He also has a VVI pacer in place.   CURRENT MEDICATIONS:  1. K-Dur 10 mEq 3 b.i.d.  2. Vytorin 10/40 daily.  3. Coumadin.  4. NovoLog.  5. Lantus insulin.  6. Glucophage.  7. Aspirin.  8. Lisinopril 20/12.5 b.i.d.  9. Lasix 160 in the morning and 80 in the afternoon.  10.Spirolactone 25 mg b.i.d.   PHYSICAL EXAMINATION:  VITAL SIGNS:  Blood pressure is 130/66 and pulse  64 and regular.  NECK:  There is no venous distension.  The carotid pulses are full  without bruits.  CHEST:  Clear.  CARDIAC:  The cardiac rhythm was regular.  There was a short systolic  murmur at the left sternal edge.  ABDOMEN:  Soft without organomegaly.  EXTREMITIES:  There was trace, possibly 1+ edema to the lower  extremities.  VITAL SIGNS:  His weight was down 10 pounds to 270 pounds.   IMPRESSION:  1. Diastolic congestive heart failure now improved and close to      euvolemic  2. Coronary artery disease and  valvular heart disease status post      coronary bypass graft surgery and aortic valve replacement for      aortic stenosis with an Edwards pericardial valve 2006.  3. Good left ventricular systolic function.  4. Obstructive sleep apnea, not compliant with continuous positive      airway pressure.  5. Obesity.  6. Chronic atrial fibrillation.  7. Diabetes.  8. Hypertension.  9. Hyperlipidemia.  10.Status post implantation of Medtronic Signa VVI generator.   RECOMMENDATIONS:  I think [name] is pretty close to euvolemic at this  point and is feeling well.  I will plan to get a BMP and BNP today.  If  this looks good, we will plan to see him in 3 months.  We instructed him  to weigh himself regularly.  If he gains more than 3 pounds, to let us  know.     Bruce Elvera Lennox Juanda Chance, MD, [facility]  Electronically Signed  BRB/MedQ  DD: 11/18/2007  DT: 11/18/2007  Job #: [id number]

## 2010-11-05 NOTE — Assessment & Plan Note (Signed)
[facility]                          CHRONIC HEART FAILURE NOTE   NAME:[name], [name]                       MRN:          [id number]  DATE:10/22/2007                            DOB:          [birth year]    PRIMARY CARDIOLOGIST:  Everardo Beals. Juanda Chance, MD, [address] [facility]   PRIMARY CARE PHYSICIAN:  Willow Ora, MD   ENDOCRINOLOGIST:  Ocie Cornfield, M.D. at [facility].   [name] is new to the Heart Failure Clinic.  He is a very pleasant 76-  year-old Caucasian gentleman followed by Dr. Juanda Chance who presents today  for further management of his congestive heart failure.  He last saw Dr.  Juanda Chance on October 05, 2007, at which time the patient was found to be in  some volume overload without any clear precipitating factors.  Dr.  Juanda Chance increased [name]'s Lasix to 160 mg in the morning and 80 in  the afternoon.  He also added spironolactone 25 mg daily and arrange for  [name] to be evaluated here in the clinic.  [name] is a very  pleasant gentleman.  He lives locally.  He is a retired Oncologist.  He states he has a very sedentary lifestyle and watches  TV a lot.  He does some mowing with his riding lawnmower.  He states he  might walk about 200 feet or so before he becomes short of breath.  He  has a history of sleep apnea, but has was noncompliant with his CPAP.  He states he just could not tolerate the mask and never resumed it.  He  also has a history of atrial fibrillation, but states he is asymptomatic  with that.  Diabetes with poor diet compliance.  He has a history of  tobacco use, but he states he quit in 1990.  [name] states previously  he tried to follow a low-sodium diet, but states over the last month 3  months he has not done very well with the sodium restricted diet.  His  wife accompanies him today and agrees with this.  [name] denies any  orthopnea or PND.  He does complain of shortness of breath with minimal  exertion, some abdominal bloating and states that his legs were very  edematous when he saw Dr. Juanda Chance, but he feels like these have improved  some.   PAST MEDICAL HISTORY:  1. Congestive heart failure secondary to diastolic dysfunction.      Patient's heart failure most likely has an ischemic component also.      The last echocardiogram in the chart done in September of 2007      revealed an EF of 55-60% at that time.  2. Coronary artery disease status post coronary artery bypass grafting      x2 in 2006.  At that time, the patient also underwent aortic valve      replacement using an Edwards pericardial tissue valve.  3. Obstructive sleep apnea with noncompliance.  4. Morbid obesity.  5. Chronic atrial fibrillation and chronic anticoagulation therapy.  6. Diabetes  7. Peripheral claudication.  8. Hypertension.  9. Dyslipidemia.  10.History of sick sinus syndrome status post implantation of a      Medtronic Sigma VVI generator.  11.History of tobacco use.   REVIEW OF SYSTEMS:  As stated above; otherwise negative.   CURRENT MEDICATIONS:  1. Lisinopril 20/HCTZ 12.5 mg one p.o. b.i.d.  2. Aspirin 81 mg daily.  3. Glucophage 500 b.i.d.  4. Lantus and NovoLog insulin as directed.  5. Coumadin as directed.  6. Vytorin 03/1939 mg daily.  7. K-Dur 10 mEq.  The patient states he takes 3 tablets b.i.d.  8. Aldactone 25 mg daily.  9. Lasix 160 in the morning and 80 in the evening.   ALLERGIES/INTOLERANCES:  1. IVP DYE.  2. TOPROL-XL (the patient states the Toprol-XL made him feel awful,      and the patient states he will never try a beta blocker again).   CLINICAL DATA:  Most recent laboratory work obtained on October 08, 2007  revealed a sodium of 138, potassium 3.8, glucose 273, BUN 14, creatinine  1.0.  BNP checked on October 05, 2007 of 94.   PHYSICAL EXAMINATION:  GENERAL:  [name] is in no acute distress.  He  has no complaints at this time.  VITAL SIGNS:  Weight 271 pounds.   According to documentations, the  patient weight is down 11 pounds from his visit with Dr. Juanda Chance on October 05, 2007.  Blood pressure 149/78 with heart rate of 67.  NECK:  Difficult to evaluate for JVD secondary to body habitus.  CHEST:  Clear to auscultation bilaterally.  CARDIOVASCULAR:  An S1 and S2.  I do not hear an S3 or S4.  ABDOMEN:  Obese, soft, nontender.  Positive bowel sounds.  LOWER EXTREMITIES:  The patient has +1 pitting edema bilaterally.  NEUROLOGIC:  Alert and oriented x3.Marland Kitchen   IMPRESSION:  Congestive heart failure secondary to diastolic dysfunction  with a normal EF in the setting of known coronary artery disease;  previous bypass.  [name] still has signs of volume overload at this  time.  I am going to increase his Lasix to 160 mg b.i.d.  He will need  follow-up blood work.  I have initiated heart failure education with him  and his wife.  We had a long talk about the importance of treatment for  his sleep apnea and to get his weight under control.  [name] is not  very motivated to try the CPAP again but states he will think about it.  Will continue his current medications with the exception of the Lasix.  He will need follow-up blood work, and I will see him back in clinic for  reevaluation.      Dorian Pod, ACNP  Electronically Signed      Rollene Rotunda, MD, [facility]  Electronically Signed   MB/MedQ  DD: 10/25/2007  DT: 10/25/2007  Job #: [id number]   cc:   Ocie Cornfield

## 2010-11-05 NOTE — Assessment & Plan Note (Signed)
[facility]                            CARDIOLOGY OFFICE NOTE   NAME:Miner, JOHNSTON MADDOCKS                       MRN:          [id number]  DATE:10/05/2007                            DOB:          1935/01/15    PRIMARY CARE PHYSICIAN:  Willow Ora, MD.   ENDOCRINOLOGIST:  Dr. Roanna Raider.   CLINICAL HISTORY:  Mr. Petrich is 76 years old and returned for management  of his coronary heart disease and congestive heart failure.  He had  aortic valve replacement and coronary bypass surgery in 2006, for aortic  stenosis.  He has had heart failure, related diastolic dysfunction and  he has atrial fibrillation which is chronic and has a VVI pacer in  place.  He says over the past couple of weeks, he has had increasing  swelling of his legs and feels bloated, and also has had increased  shortness of breath with exertion.  He has had no chest pain or  palpitations.   PAST MEDICAL HISTORY:  1. Insulin-dependent diabetes.  2. Hypertension.  3. Hyperlipidemia.   CURRENT MEDICATIONS:  1. Lisinopril 20 mg daily.  2. Vytorin 10/40 mg daily.  3. Coumadin.  4. NovoLog insulin.  5. Lantus insulin.  6. Glucophage.  7. Aspirin.  8. Lasix 80 mg 2 tablets in the morning.  9. Potassium 10 mEq 6 in the morning and 6 in the evening.   PHYSICAL EXAMINATION:  VITAL SIGNS:  Blood pressure is 160/72, pulse 67  and regular.  NECK:  There was no venous pulsation visible 2 cm above the clavicle.  CHEST:  Rales at both lung bases.  CARDIAC:  Rhythm was regular.  Short systolic murmur at the left sternal  edge.  There __________ tissue valve closure sound and no AI murmur.  ABDOMEN:  Protuberant.  The abdomen was soft with normal bowel sounds  and there was no hepatosplenomegaly.  EXTREMITIES:  There was 2+ edema of the lower extremities.   IMPRESSION:  1. Diastolic heart failure with increased volume overload.  2. Valvular heart disease, status post aortic valve replacement for  aortic stenosis in January 2006, with a tissue valve  3. Coronary artery, status post coronary bypass graft surgery in      January 2006.  4. Chronic atrial fibrillation.  5. Status post Medtronic VVI pacemaker.  6. Insulin-dependent diabetes.  7. Hypertension.  8. Hyperlipidemia.   RECOMMENDATIONS:  Mr. Tappan has developed volume overload.  There is no  clear precipitating cause, although his blood is up.  Will plan to  increase his Lasix from 80 mg 2 tablets in the morning to 80 mg 2  tablets in the morning and 1 tablet in the afternoon.  Will add  aldactone 25 mg a day.  Will get a BMP, BNP and CBC on him today and get  a BNP in a week.  I will also decrease his potassium from 10 mEq 6  tablets b.i.d. to 3 tablets b.i.d.Marland Kitchen  I will have him seen in the heart  failure clinic next week, and I will see him in 4-5 weeks.  Bruce Elvera Lennox Juanda Chance, MD, [facility]  Electronically Signed    BRB/MedQ  DD: 10/05/2007  DT: 10/05/2007  Job #: [id number]

## 2010-11-08 NOTE — Assessment & Plan Note (Signed)
[facility]                              CARDIOLOGY OFFICE NOTE   NAME:[name], [name]                       MRN:          [id number]  DATE:02/25/2006                            DOB:          10-Apr-1935    REFERRING PHYSICIAN:  Willow Ora, MD   CLINICAL HISTORY:  [name] is 76 years old and had a tissue aortic valve  replacement and bypass surgery in 2006.  He also has severe LVH and has  diastolic dysfunction with a history of congestive heart failure.  He also  developed postoperative atrial fibrillation which has become chronic, and he  developed marked bradycardia and required VVI pacing.   He says over the last month or two he has had increased symptoms of  shortness of breath.  He has attributed this to weight gain, either fluid or  tissue weight gain.  He has had no chest pain and no palpitations.  He also  has had some slight dizziness.   PAST MEDICAL HISTORY:  1. Hypertension  2. Insulin-dependent diabetes.  3. Hyperlipidemia.   CURRENT MEDICATIONS:  Lisinopril/hydrochlorothiazide, Vytorin, Coumadin,  furosemide, NovoLog, Lantus, Kay Ciel, Glucophage, and aspirin.   EXAMINATION:  VITAL SIGNS:  Blood pressure 175/79, pulse 61 and regular.  NECK:  Venous pulsation visible 1 cm above the clavicle.  CHEST:  Clear.  HEART:  The cardiac rhythm was regular.  There was a short systolic murmur  at the left sternal edge.  ABDOMEN:  Protuberant.  I could feel no hepatosplenomegaly.  The abdomen was  soft.  Normal bowel sounds.  There was 1+ peripheral edema and the pedal  pulses were equal.   An ECG showed a paced rhythm at a rate of 60.   IMPRESSION:  1. Valvular heart disease, status post tissue aortic valve replacement and      bypass surgery January 2006.  2. Congestive heart failure, related to diastolic dysfunction with      significant left ventricular hypertrophy, now somewhat decompensated.  3. Chronic atrial fibrillation.  4.  Status post Medtronic VVI pacemaker.  5. Insulin-dependent diabetes.  6. Hypertension.  7. Hyperlipidemia.   RECOMMENDATIONS:  I think [name] increased shortness of breath may be  multifactorial.  He does seem to have some mild volume overload.  His blood  pressure is also quite high.  He has also gained some tissue weight which  may contribute to this.  He also may have diastolic dysfunction which may  contribute to this.  We will increase his Lasix from 80 b.i.d. to 120 in the  morning and 80 in the afternoon.  We interrogated his pacemaker and his rate  response.  It is still inadequate and we will make this more sensitive.  We  will also get a 2D echo to reevaluate his LV function and diastolic  dysfunction.  We will add Norvasc to get to improve his blood pressure.  We  will get blood work including a BMP, CBC and BNP now, and we will have him  come back in a week for a BMP.  I  will see him in followup in 6 weeks.                               Bruce Elvera Lennox Juanda Chance, MD, [facility]    BRB/MedQ  DD:  02/25/2006  DT:  02/25/2006  Job #:  [id number]

## 2010-11-08 NOTE — Consult Note (Signed)
[name], [name]                ACCOUNT NO.:  [id number]   MEDICAL RECORD NO.:  [id number]          PATIENT TYPE:  OIB   LOCATION:  4704                         FACILITY:  [facility]   PHYSICIAN:  Evelene Croon, M.D.     DATE OF BIRTH:  September 04, 1934   DATE OF CONSULTATION:  07/12/2004  DATE OF DISCHARGE:                                   CONSULTATION   REFERRING PHYSICIAN:  Dr. Charlies Constable.   REASON FOR CONSULTATION:  Severe aortic stenosis and three-vessel cardiac  disease with congestive heart failure.   HISTORY OF PRESENT ILLNESS:  This patient is a 76 year old obese, diabetic  gentleman with a history of coronary disease status post multiple cardiac  catheterizations in the past and stents placed in the right coronary artery  and the circumflex.  His last catheterization was in April of 2005, at which  time he had patent stents in the right coronary artery and nonobstructive  disease in the LAD and left circumflex arteries.  He had moderate aortic  stenosis at that time, with a mean gradient of 24 at the time of  catheterization.  The patient now presents with about a two-week history of  progressive exertional dyspnea, lower-extremity edema, orthopnea and PND  which culminated in a severe episode on Tuesday night in which he was  awakened a few hours after going to sleep with acute shortness of breath,  and had to sit up in a chair leading forward in order to catch his breath.  He had no chest pain or pressure.  He was admitted with heart failure and  diuresed with improvement.  An echocardiogram was performed which showed  severe aortic stenosis with good left ventricular function.  The formal  results of that echocardiogram are not on the chart yet.  He subsequently  underwent cardiac catheterization today which showed moderate-to-severe  aortic stenosis with a peak gradient of 39, a mean gradient of 31, but an  aortic valve area of 1.27cm squared, and an aortic valve area index  of 0.5  due to his PSA of 2.41.  His cardiac index was 2.6. Pulmonary wedge pressure  was elevated at 21, going to 30.  Right atrial pressure was 5.  PA pressure  was 53/15 with a mean of 34.  Left ventricular function is normal with an  ejection fraction of about 60%.  Cardioangiography showed three-vessel  disease.  The LAD had no obstruction lesions.  The diagonal branch had about  80% proximal stenosis.  The left circumflex had less than 20% stenosis  within the proximal stent.  There was a first marginal that had about 80%  stenosis proximally. The distal left circumflex terminated as a large branch  that had about 80% stenosis.  The right coronary artery had less than 10%  stenosis within the stented areas with diffuse irregularity.  This was a  large, dominant vessel.   REVIEW OF SYSTEMS:  GENERAL:  He denies fever or chills.  He has had no  recent changes in his weight.  It usually runs 265 to 275.  He leads a  fairly sedentary lifestyle due to pain in his right hip with walking.  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  ENT:  Negative.  He does see a dentist every six months to have his teeth  cleaned.  ENDOCRINE:  He has adult-onset diabetes for about the last 10  years.  He denies hypothyroidism.  CARDIOVASCULAR:  As noted above.  He  denies palpitations.  He has a long history of peripheral edema,  intermittently. He has been on Lasix for that.  RESPIRATORY:  He denies  cough and sputum production.  He does have a history of sleep apnea, and has  used a CPAP machine in the past, but stopped using it because he could not  sleep with it.  GI:  He has had no nausea or vomiting.  He denies melena and  bright red blood per rectum.  GU:  He has had no dysuria or hematuria.  NEUROLOGICAL:  He denies any focal weakness or numbness.  He denies  dizziness or syncope.  MUSCULOSKELETAL:  He complains of a lot of right hip  pain since his first  catheterization was done which has limited his ability  to ambulate for exercise.  PSYCHIATRIC:  Negative.   ALLERGIES:  NONE.   PAST MEDICAL HISTORY:  Significant for insulin-dependent diabetes,  hypertension, and hyperlipidemia.  He has a history of diabetic eye disease,  status post laser surgery.  He is status post right shoulder arthroscopy in  the past.  He is status post multiple coronary stenting as mentioned above.   MEDICATIONS PRIOR TO ADMISSION:  1.  Aspirin 81 mg daily.  2.  Plavix 75 mg daily.  3.  Torsemide 50 mg daily.  4.  Atenolol 100 mg daily.  5.  Diovan/HCTZ 160/12.5 mg b.i.d.  6.  Lantus insulin 85 mg daily.  7.  NovoLog 70/30, 10 units, two to three times per day as needed.  8.  Glucophage 1000 mg b.i.d.  9.  Vytorin 10/40 daily.  10. Potassium chloride 50 mEq p.o. b.i.d.   FAMILY HISTORY:  Strongly positive for coronary disease, diabetes,  hypertension and stroke.   SOCIAL HISTORY:  He is married and lives with his wife.  He does not smoke.   PHYSICAL EXAMINATION:  VITAL SIGNS:  Blood pressure 170/73 with a pulse of  62 and regular.  Respiratory rate 18 and unlabored.  He is an obese, white  male, in no distress.  HEENT:  Exam shows him to be normocephalic, atraumatic.  Pupils are equal,  reactive to light and accommodation.  Extraocular muscles are intact.  Throat is clear.  Teeth are in good condition.  NECK:  Exam shows normal carotid pulses bilaterally.  There is a transmitted  murmur left side of his neck.  LUNGS:  Clear.  There are decreased breath sounds in the bases.  CARDIAC:  Exam shows a regular rate and rhythm with a grade 3/6 systolic  murmur along the left sternal border which is heard throughout the  precordium and in the neck.  ABDOMEN:  Exam shows marked obesity.  There are no palpable masses or  organomegaly. EXTREMITIES:  Exam shows mild pedal edema bilaterally.  Pedal pulses are not  palpable.  SKIN:  Warm and dry.  NEUROLOGIC:   Exam shows him to be alert and oriented x3. Motor and sensory  exams are grossly normal.   LABORATORY DATA:  Includes carotid Doppler examination which showed  bilateral 40-60%  ICA stenosis.  His ABI was 0.72 on the right, and greater  than 1 on the left.  His electrolytes are normal with a BUN of 10 and  creatinine of 0.8.  White blood cell count 11.8, hemoglobin 13.7 with a  platelet count of 312,000.  BNP was 277.   Electrocardiogram shows sinus bradycardia with first-degree AV block, old  anterior infarct, and anterolateral ST-T wave changes.   IMPRESSION:  Mr. Hanrahan has severe aortic stenosis and three-vessel coronary  disease with progressive symptoms over the past two weeks.  He was admitted  with congestive heart failure which rapidly improved with diuresis.  I agree  that coronary artery bypass graft surgery and aortic valve replacement is  the best treatment for this patient.  I discussed the operative procedure  with the patient and his wife, including alternatives, benefits, and risks,  including bleeding, blood transfusion, infection, stroke, myocardial  infarction, graft failure and death.  We also discussed the options for  replacing the aortic valve including mechanical valve and tissue valve.  We  discussed the pros and cons of both types of valves.  I told him that I  thought a tissue valve would likely last the rest of his life, given his age  of 37 years, with coronary disease, diabetes, and obesity.  I also think  that it may be necessary to use a stent with porcine valve in this patient  with marked obesity and a body surface area of 2.4 who is likely to have a  relatively small aortic annulus compared to his body mass.  This would make  a stent with valve a much better choice for him  since it would give him the biggest orifice area and the least transvalvular  gradient which would be best for his long-term prognosis.  He understands  and is going to proceed with  whatever valve choice I make.  He would rather  not take Coumadin if it can be avoided.  We will plan to do surgery on  Monday, 07/15/04.      Brya   BB/MEDQ  D:  07/12/2004  T:  07/13/2004  Job:  [id number]   cc:   Charlies Constable, M.D. [facility]

## 2010-11-08 NOTE — Op Note (Signed)
[name], [name]                ACCOUNT NO.:  [id number]   MEDICAL RECORD NO.:  [id number]          PATIENT TYPE:  INP   LOCATION:  2025                         FACILITY:  [facility]   PHYSICIAN:  Olga Millers, M.D. LHCDATE OF BIRTH:  [birth year]   DATE OF PROCEDURE:  07/23/2004  DATE OF DISCHARGE:                                 OPERATIVE REPORT   PROCEDURE:  Cardioversion of atrial fibrillation.   CARDIOLOGIST:  Olga Millers, M.D.   The patient had a transesophageal echocardiogram prior to the procedure that  showed no left atrial thrombus.  He was subsequently sedated with Pentothal  175 mg intravenously.  Synchronized cardioversion with 120 joules (biphasic)  resulted in normal sinus rhythm.  There were no immediate complications.  We  will need to continue with Coumadin.      BC/MEDQ  D:  07/23/2004  T:  07/23/2004  Job:  [id number]

## 2010-11-08 NOTE — Cardiovascular Report (Signed)
[name], [name]                ACCOUNT NO.:  [id number]   MEDICAL RECORD NO.:  [id number]          PATIENT TYPE:  OIB   LOCATION:  2873                         FACILITY:  [facility]   PHYSICIAN:  Charlies Constable, M.D. [facility] DATE OF BIRTH:  May 21, 1935   DATE OF PROCEDURE:  07/29/2005  DATE OF DISCHARGE:                              CARDIAC CATHETERIZATION   CLINICAL HISTORY:  [name] is 76 years old and has had prior coronary  artery bypass surgery and aortic valve replacement.  He developed atrial  fibrillation postoperatively which has become chronic.  He has had heart  failure related to diastolic dysfunction, but this has improved and has been  fairly well controlled recently.  Recently, he had episodes of presyncope  and had fairly significant bradycardia on examination in the office, and we  got a Holter monitor which showed long pauses. We made a decision to bring  him in for VVI pacing.  His Coumadin was held several days prior to the  procedure.   PROCEDURE:  Implantation of a Medtronic Sigma VVI generator (model no.  VHQ469G, serial no. [id number]), with a Medtronic bipolar screw-in  ventricular lead (model no. 5076-58 cm, serial no. [id number]).   INDICATIONS:  Sick sinus syndrome, with chronic atrial fibrillation and very  slow ventricular rates with long pauses and presyncope.   ANESTHESIA:  1% local Xylocaine.   ESTIMATED BLOOD LOSS:  Less than 15 cc.   COMPLICATIONS:  None.   PROCEDURE:  The procedure was performed in laboratory room #3.  The left  anterior chest was prepped and draped in the usual fashion.  The skin and  subcutaneous tissue were anesthetized with 1% local Xylocaine.  Using an 18-  gauge thin-walled needle, the subclavian vein was entered, and access was  secured with a 0.038 wire.  An incision was made below the clavicle and  extended to the prepectoral fascia.  A pocket was made inferior to the  incision using blunt dissection.  Using a 9 French  sheath, the ventricular  lead was positioned in the right atrium.  A 0.038 wire was placed in the  subclavian vein for later access.  A figure-of-eight suture was placed at  the entry site for hemostasis and for later securing the lead.  The  ventricular lead was positioned in the right ventricular apex, with good  pacing parameters described below.  After removal of the stilette and the  0.038 wire, the figure-of-eight suture was secured at the entry site.  The  lead was attached to the posterior aspect of the pocket with two sutures of  1-0 silk around the Silastic collar.  The pocket was irrigated with sterile  kanamycin solution.  The lead was attached to the generator with a hex nut  wrench, and the generator was implanted into the pocket and secured loosely  to the prepectoral fascia with 1-0 silk.  The subcutaneous tissue was closed  with running 2-0 Vicryl.  The skin was closed with running 4-0 Vicryl.  The  patient tolerated the procedure well.   PACING PARAMETERS:  The R wave was  20 millivolts.  The minimum threshold for  capture was 0.5 volts at a pulse width of 0.5 msec.  The impedance was 856  ohms.  There was no pacing of the diaphragm at 10 volts.   The patient tolerated the procedure well and left the laboratory in  satisfactory condition.           ______________________________  Charlies Constable, M.D. [facility]     BB/MEDQ  D:  07/29/2005  T:  07/30/2005  Job:  [id number]   cc:   Wanda Plump, MD [facility]  [phone] [place], [place] [address]   Cardiopulmonary Lab

## 2010-11-08 NOTE — H&P (Signed)
[name], [name]                ACCOUNT NO.:  [id number]   MEDICAL RECORD NO.:  [id number]          PATIENT TYPE:  INP   LOCATION:                               FACILITY:  [facility]   PHYSICIAN:  Doylene Canning. Ladona Ridgel, M.D.  DATE OF BIRTH:  July 11, 1934   DATE OF ADMISSION:  09/11/2004  DATE OF DISCHARGE:                                HISTORY & PHYSICAL   ADMITTING DIAGNOSIS:  Atrial fibrillation and anemia for blood transfusion,  followed by D/C cardioversion guided by transesophageal echocardiography.   HISTORY OF PRESENT ILLNESS:  The patient is a 76 year old man with a history  of coronary artery disease, status post multiple percutaneous interventions  who developed worsening shortness of breath, worsening coronary disease, as  well as aortic valve stenosis.  He underwent aortic valve replacement and  bypass surgery in January 2006.  He developed postoperative atrial  fibrillation which was difficult to control.  He was initially placed on  amiodarone.  The patient has a history of intermittent heart block.  Following initiation of atrial fibrillation, he was cardioverted but  returned to atrial fibrillation within several days.  He was placed on  amiodarone and has been fairly stable; however, he has developed over the  last several weeks worsening anemia.  His hemoglobin has been in the 8  range.  Associated with this has been congestive heart failure symptoms,  presently class III.  He also notes peripheral edema.  He is admitted  tonight to undergo transfusion followed by TEE-guided D/C cardioversion in  the setting of sub-therapeutic INR in the past month.   PAST MEDICAL HISTORY:  1.  Longstanding diabetes.  2.  He has a history of aortic stenosis, status post valve replacement.  3.  He has a history of postoperative atrial fibrillation as well as      preoperative atrial fibrillation by his report.  4.  He has a history of hyperlipidemia.  5.  Hypertension.   FAMILY HISTORY:   Notable for coronary disease.   SOCIAL HISTORY:  The patient is married and lives with his wife.  He denies  tobacco or ethanol abuse.   REVIEW OF SYSTEMS:  Notable for generalized fatigue and malaise.  He denies  frank syncope.  He does have rare palpitations.  He has dyspnea with  exertion.  He has no current chest pain.  He denies nausea presently but has  had nausea on amiodarone in the past.  He denies vomiting but does continue  to be bothered by constipation.  He denies arthritic complaints.  He does  have chronic peripheral edema.  He denies polyuria, polydipsia, heat or cold  intolerance.  He denies any neurologic symptoms.   PHYSICAL EXAMINATION:  GENERAL:  He is a pleasant 76 year old man in no  distress.  VITAL SIGNS:  Blood pressure was 170/85, the pulse was 62 and irregularly  irregular, respirations were 20.  HEENT:  Normocephalic and atraumatic.  Pupils equal and round.  Oropharynx  was moist.  The sclerae are anicteric.  NECK:  Revealed no jugular venous distention.  There is no  thyromegaly.  The  trachea was midline.  LUNGS:  Revealed scattered rales at the bases bilaterally.  CARDIOVASCULAR:  Revealed a regular rate and rhythm with normal S1 and S2.  ABDOMEN:  Soft, nontender, nondistended.  There is no organomegaly.  EXTREMITIES:  Demonstrated 2+ peripheral edema bilaterally.  NEUROLOGIC:  Alert and oriented x 3 with cranial nerves intact.  Strength  was 5/5 and symmetric.   Prior EKG demonstrated atrial fibrillation with a controlled ventricular  response.   IMPRESSION:  1.  Persistent atrial fibrillation.  2.  History of bradycardia.  3.  Aortic valve disease, status post aortic valve replacement.  4.  Coronary disease, status post bypass surgery.  5.  Congestive heart failure secondary to diastolic dysfunction and atrial      fibrillation.  6.  Anemia of unknown etiology.   DISCUSSION:  1.  We will plan to admit the patient to the hospital.  2.  Type  and cross and transfuse two units of packed red cells.  3.  We will plan TEE guided cardioversion in the morning.       ___________________________________________  Doylene Canning. Ladona Ridgel, M.D.    GWT/MEDQ  D:  09/11/2004  T:  09/12/2004  Job:  [id number]   cc:   Angelena Sole, M.D. [facility]
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
## 2010-11-08 NOTE — Assessment & Plan Note (Signed)
[facility]                            CARDIOLOGY OFFICE NOTE   NAME:[name], [name]                       MRN:          [id number]  DATE:10/13/2006                            DOB:          April 08, 1935    PRIMARY CARE PHYSICIAN:  Dr. Willow Ora.   CLINICAL HISTORY:  [name] is 76 years old and returned for management  of his coronary heart disease and congestive heart failure.  He had  aortic valve replacement for aortic stenosis and coronary bypass graft  surgery in 2006.  He subsequently developed atrial fibrillation which  has become chronic.  He has a VVI pacer in place for bradycardia.  He  also has had congestive heart failure related to diastolic dysfunction  which has been managed with Lasix.   He says he has been doing fairly well over the past several months.  He  had 1 episode of chest pain about 2 weeks ago which he said felt like  chest wall pain and lasted about 30 minutes.  He had an episode of chest  pain about 2 weeks ago which lasted about 30 minutes, was not related to  exertion.  He felt it was probably chest wall pain.  He gets short of  breath with exertion but this has not changed.  He does not feel like he  has had any swelling or edema.  He has had no palpitations.   PAST MEDICAL HISTORY:  Significant for:  1. Hypertension.  2. Insulin-dependent diabetes.  3. Hyperlipidemia.   CURRENT MEDICATIONS:  Lisinopril, Vytorin, Coumadin, NovoLog sliding  scale and Lantus insulin, Glucophage, aspirin, potassium and Lasix.   EXAMINATION:  The blood pressure is 140/70, the pulse 64 and regular.  There was no venous distention. The carotid pulses were full without  bruits.  CHEST:  Clear without rales or rhonchi.  CARDIAC RHYTHM:  Regular.  I could hear no murmurs or gallops.  ABDOMEN:  Protuberant.  His abdomen was soft with normal bowel sounds.  There was trace peripheral edema and the pedal pulses were equal.   An ECG showed a  paced rhythm with the magnet at a rate of 86.   IMPRESSION:  1. Valvular heart disease status post aortic valve replacement for      __________ aortic stenosis with a tissue valve, January, 2006.  2. Coronary artery disease status post coronary bypass graft surgery,      January, 2006.  3. Congestive heart failure related to diastolic dysfunction, now      fairly well compensated, Class II-III.  4. Chronic atrial fibrillation.  5. Status post Medtronic VVI pacemaker.  6. Insulin dependent diabetes.  7. Hypertension.  8. Hyperlipidemia.   RECOMMENDATIONS:  I think [name] is doing well.  His volume status  appears to be well compensated.  He does have limited exercise tolerance  which is probably multifactorial.  I will plan to continue the same  medications.  Will get some laboratory studies and will plan to get  fasting laboratory studies including lipid, liver, CBC, BNP and BMP.  Will also check  his pacemaker today.  I will see him back in followup in  6 months.     Bruce Elvera Lennox Juanda Chance, MD, [facility]  Electronically Signed    BRB/MedQ  DD: 10/13/2006  DT: 10/13/2006  Job #: [id number]

## 2010-11-08 NOTE — Discharge Summary (Signed)
[name], [name]                ACCOUNT NO.:  [id number]   MEDICAL RECORD NO.:  [id number]          PATIENT TYPE:  INP   LOCATION:  3729                         FACILITY:  [facility]   PHYSICIAN:  Charlies Constable, M.D. [facility] DATE OF BIRTH:  1935/01/22   DATE OF ADMISSION:  09/11/2004  DATE OF DISCHARGE:                           DISCHARGE SUMMARY - REFERRING   SUMMARY OF HISTORY:  [name] is a 76 year old white male who was admitted  from the office secondary to post-bypass worsening anemia and persistent  atrial fibrillation resulting in symptoms of weakness, fatigue, dyspnea on  exertion.  Dr. Juanda Chance, after reviewing subsequent hemoglobins and  hematocrits in the office noted that they continued to drop,  Prior to his  bypass surgery, his hemoglobin was 13; postoperatively, it was 10 and now on  September 10, 2004, it is 6.  Also postoperatively, he had some problems with  atrial fibrillation which may also be contributing to his symptoms, thus Dr.  Juanda Chance wanted him to be admitted for further evaluation.  Please refer to  history and physical dictation.   LABORATORY:  Chest x-ray showed status post aortic valve replacement, mild  cardiomegaly, improvement in aeration and edema since prior studies.   Admission hemoglobin and hematocrit were 8.2 and 24.4 with normal indices,  platelets 359,000, WBC 10.7.  On the 23rd, hemoglobin and hematocrit were  9.9 and 30.6, and on the 24th, it was 10.0 and 30.3, normal indices,  platelets 407,000, WBC 12.2.  Admission PTT was 51, PT 21.4, INR 2.4.  Sodium was 136, potassium 3.2, BUN 17, creatinine 1.3, glucose 77; at the  time of discharge, potassium was 3.0, BUN 16, creatinine 1.3.  Hemoglobin  A1c was 7.7.  Fasting lipids showed total cholesterol 115, triglycerides 95,  HDL 31, LDL 65.  Iron was 107 with a TIBC of 384, percent saturation 28,  ferritin 137.   HOSPITAL COURSE:  [name] was admitted to 3700.  He received 2 units of  packed RBCs;  overnight, hemoglobin and hematocrit had improved and the  patient stated that he had already felt much better.  He remained in atrial  fibrillation.  Dr. Dietrich Pates proceeded with TEE cardioversion.  Initial 200-  joule shock did not restore him to normal sinus rhythm; his second shock did  restore him to normal sinus rhythm.  He did have some intermittent first-  and second-degree AV block, post second shock.  On September 13, 2004, Dr.  Juanda Chance reviewed the patient and it was felt that he was stable for  discharge.   DISCHARGE DIAGNOSES:  1.  Anemia of uncertain etiology, status post transfusions.  2.  Atrial fibrillation, status post transesophageal echocardiographic-      guided cardioversion since INR has not been therapeutic for the      preceding 4 weeks, post-conversion to sinus bradycardia and second-      degree atrioventricular block.  3.  Hypokalemia.  4.  History as previously.   DISPOSITION:  [name] is discharged home.   DISCHARGE MEDICATIONS:  His potassium was increased to 120 mEq daily.  He  was asked to continue iron 325 daily; his medications otherwise remain  unchanged; these include:  1.  Diovan/HCT 160/12.5 daily.  2.  Amiodarone 200 daily.  3.  Aspirin 81 daily.  4.  Torsemide 100 daily.  5.  Vytorin 10/40 daily.  6.  Coumadin 2.5 daily.  7.  Lantus and sliding scale as previously, as well as the hydrocodone and      Dulcolax as previously   DIET:  He was advised to maintain a low-salt/-fat/-cholesterol ADA diet.   He will have blood work on Tuesday consisting of the PT/INR at the [facility] and CBC and a BMP.  The office will call him with arrangements for a  Holter monitor next week.  He will follow up with Dr. Juanda Chance on the September 25, 2004 at 10:00 a.m.      EW/MEDQ  D:  09/13/2004  T:  09/13/2004  Job:  [id number]   cc:   Angelena Sole, M.D. [facility]

## 2010-11-08 NOTE — Cardiovascular Report (Signed)
[name], [name]                          ACCOUNT NO.:  [id number]   MEDICAL RECORD NO.:  [id number]                   PATIENT TYPE:  INP   LOCATION:  2035                                 FACILITY:  [facility]   PHYSICIAN:  Arturo Morton. Riley Kill, M.D. [facility]         DATE OF BIRTH:  10-Dec-1934   DATE OF PROCEDURE:  09/25/2003  DATE OF DISCHARGE:                              CARDIAC CATHETERIZATION   INDICATIONS:  This nice 76 year old gentleman had some atypical chest  discomfort on admission with mild troponin elevation.  Unlike prior  episodes, this was left upper quadrant, and he thought it was gas.  With  positive troponins, he was referred for repeat cardiac catheterization.   PROCEDURE:  1. Left heart catheterization.  2. Selective coronary arteriography.  3. Selective left ventriculography.   DESCRIPTION OF PROCEDURE:  The patient was brought to the cath lab and  prepped and draped in usual fashion.  Through an anterior puncture, the  right femoral artery was entered with some difficulty because of prior scar  tissue.  However, a #6 [place] sheath was placed.  Views of the left and  right coronary arteries were obtained in multiple angiographic projections.  We were able to cross the aortic valve using a pigtail and J wire.  Pressure  gradients were measured across the aortic valve.  Following  ventriculography, the pigtail was pulled back with pressure pullback  recorded.  The procedure was then completed and the patient was taken to the  holding area in satisfactory clinical condition.   HEMODYNAMIC DATA:  1. The central aortic pressure was 131/63, mean 90.  2. Left ventricle 157/31.  3. A 33 mm peak to peak gradient and 24 mm mean valve gradient.   ANGIOGRAPHIC DATA:  1. Ventriculography was performed in the RAO projection.  Systolic function     appeared to be well preserved and there was not a definite wall motion     abnormality.  2. The left main coronary was free of  critical disease.  3. The left anterior descending artery has about a 50% area of narrowing     prior to it involving what appears to be the second diagonal.  The second     diagonal, itself, has about 50% narrowing.  This area is slightly hazy     and heavily calcified, but in RAO cranial views appears to be relatively     smooth and without critical focal compromise.  The remainder of the LAD     has mild luminal irregularities.  There is a first diagonal branch that     has a long area of segmental 80% narrowing.  4. The circumflex has 30% ostial narrowing, and then there is a small     marginal branch with about 70% narrowing.  Following this, there is a     diffuse area of 40-60% narrowing with a 60-70% focal area leading into  the large marginal branch.  5. The right coronary artery has a proximal right ventricular branch with     about 90% narrowing that does not supply significant myocardium.  The     native vessel, itself, has luminal irregularities, but both stent sites     appear to be widely patent.   CONCLUSIONS:  1. Mild/moderate aortic valve stenosis.  2. Well-preserved left ventricular function.  3. 50% mid LAD stenosis with involvement of the major diagonal branch.  4. 80% mid stenosis of a diagonal branch over a long segmental territory.  5. Diffuse segmental disease of the circumflex marginal.  6. Continued patency of the RCA stents.   It is difficult to be certain that there is any change from the previous  angiogram.  The LAD and circumflex diseases were present previously.  Any of  these has the potential for causing symptoms.  His symptoms are clearly  different from before.  His RCA sites, which represented the critical  disease on the prior catheterization, are widely patent.  I will review the  films with Dr. Juanda Chance, but a final plan is not yet in place.                                               Arturo Morton. Riley Kill, M.D. [facility]    TDS/MEDQ  D:   09/25/2003  T:  09/27/2003  Job:  [id number]   cc:   Angelena Sole, M.D. [facility]   Charlies Constable, M.D. [facility]   CV Laboratory

## 2010-11-08 NOTE — Cardiovascular Report (Signed)
[name], [name]                ACCOUNT NO.:  [id number]   MEDICAL RECORD NO.:  [id number]          PATIENT TYPE:  OIB   LOCATION:  4704                         FACILITY:  [facility]   PHYSICIAN:  Charlies Constable, M.D. [facility] DATE OF BIRTH:  09-10-1934   DATE OF PROCEDURE:  07/12/2004  DATE OF DISCHARGE:                              CARDIAC CATHETERIZATION   PROCEDURE:  Cardiac catheterization.   CARDIOLOGIST:  Charlies Constable, M.D.   CLINICAL INDICATIONS:  [name] is 76 years old and has had coronary  disease and has had multiple percutaneous interventions with three stents in  his right coronary artery and two stents in his circumflex artery.  He also  has aortic stenosis which was moderate by my last catheterization about 10  months ago.  Recently, he developed increasing shortness of breath and came  in for an a.m. catheterization, but his shortness of breath had gotten much  worse and we felt he was in severe congestive heart failure and was treated  with diuretics.  His catheterization was postponed yesterday and rescheduled  for today.  He is also diabetic.   DESCRIPTION OF PROCEDURE:  The procedure was performed via the right femoral  artery using an arterial sheath and 6 French preformed coronary catheters.  A front wall arterial puncture was performed, and Omnipaque contrast was  used.  Right heart catheterization was performed percutaneously through the  right femoral vein, using an __________ sheath was performed with the  thermodilution catheter.  A distal aortogram was performed to rule out  abdominal aortic aneurysm.  The patient tolerated the procedure well and  left the laboratory in satisfactory condition.   RESULTS:  The left main coronary artery:  The left main coronary artery was  free of significant disease.   Left anterior descending:  The left anterior descending artery gave rise to  three large diagonal branches and a septal perforator.  The LAD was  irregular.   There was 80% ostial stenosis in the first diagonal branch.   Circumflex artery:  The circumflex artery was a moderate size vessel that  gave rise to a marginal branch and a large posterior lateral branch.  There  was 30% ostial stenosis.  There was less than 20% stenosis at the  overlapping stents in the proximal circumflex artery.  There was 80% ostial  stenosis at the first marginal branch which was crossed with the stent.  There was 80% stenosis in the distal posterior lateral branch which appeared  irregular and could be a ruptured plaque.   Right coronary artery:  The right coronary artery was a moderate size vessel  that gave rise to two right ventricular branches, a posterior descending and  four posterior lateral branches.  There was less than 10% stenosis of the  stent in the proximal to mid vessel, in the mid vessel and in the distal  vessel.  There were irregularities in the right coronary artery but no other  obstruction.   LEFT VENTRICULOGRAPHY:  The left ventriculogram was performed in the RAO  projection and showed good wall motion with no areas  of hypokinesis.  There  was left ventricular hypertrophy.  The aortic valve was calcified with  decreased mobility.   DISTAL AORTOGRAM:  A distal aortogram was performed which was showed patent  renal arteries and no significant aortic obstruction.   HEMODYNAMIC DATA:  The right atrial pressure was 5 mean.  The pulmonary  pressure was 53/15 with a mean of 34.  The pulmonary wedge pressure was 21,  and rose to 30 by the end of the procedure.  The left ventriculogram shows  197/39 and the aortic pressure was 158/68 with a mean of 104.  Cardiac  output/cardiac index was 6.4/2.7 x 6 and 6.3/2.6  by thermodilution.  Body  surface area was 2.41.  The peak aortic valve gradient was 39 mmHg and the  mean aortic valve gradient was 31 mmHg.  The calculated aortic valve area  was 1.2 sq cm, and the aortic valve area index was 0.5 sq cm  m2.   CONCLUSION:  1.  Moderately severe aortic stenosis with a peak aortic valve gradient of      39 mmHg, mean aortic grade valve gradient of 31 mmHg, a calculated      aortic valve area of 1.2 sq cm and an aortic valve area index of 0.5 sq      cm per m2.  2.  Congestive heart failure with elevated pulmonary artery wedge pressure      of 21-30.  3.  Coronary artery disease status post multiple percutaneous interventions      with 80% narrowing in the first diagonal branch to the LAD, 30% ostial      stenosis in the circumflex artery, less than 20% stenosis at the stent      site in the proximal circumflex artery, 80% ostial stenosis in the first      marginal branch and 80% stenosis in the distal circumflex artery, and      less than 10% stenosis at each of the three tandem nonoverlapping stents      in the proximal to mid and distal right coronary artery.  4.  Normal left ventricular function.   RECOMMENDATIONS:  The patient has moderately severe aortic stenosis.  He is  a very large gentleman and by aortic valve index, the valve is critical.  He  also has associated congestive heart failure.  I think surgical valve  replacement is the best option combined with bypass surgery.  Surgical  consultation will be obtained.  The family has requested Dr. [name].  His  hypoxia appears out of proportion to his congestive heart failure.  While  lying flat and without any dyspnea with the pulmonary wedge pressure of 21,  his O2 saturation was 85.  We will get a spiral CT to rule out pulmonary  embolus.  He has no history of pulmonary disease.       BB/MEDQ  D:  07/12/2004  T:  07/12/2004  Job:  [id number]   cc:   Angelena Sole, M.D. [facility]   Cardiopulmonary Lab

## 2010-11-08 NOTE — Discharge Summary (Signed)
NAMEDEWITTE, VANNICE                ACCOUNT NO.:  [id number]   MEDICAL RECORD NO.:  [id number]          PATIENT TYPE:  OIB   LOCATION:  6526                         FACILITY:  [facility]   PHYSICIAN:  Charlies Constable, M.D. [facility] DATE OF BIRTH:  [birth year]   DATE OF ADMISSION:  07/29/2005  DATE OF DISCHARGE:  07/30/2005                                 DISCHARGE SUMMARY   ALLERGIES:  NO KNOWN DRUG ALLERGIES.   PRINCIPAL DIAGNOSES:  1.  Discharging day #1 status post implantation of Medtronic SIGMA pacemaker      set at VVI.  2.  History of atrial fibrillation with slow ventricular rate with a      nonspecific intraventricular block.  3.  A 48 Holter monitor demonstrating pauses.   SECONDARY DIAGNOSES:  1.  Atrial fibrillation status post direct current cardioversion March 2006.  2.  Coronary artery disease, history of multiple percutaneous coronary      interventions, three stents to the right coronary artery, two stents to      the left circumflex.  3.  Severe aortic stenosis.  4.  Left heart catheterization July 17, 2004, for admission with      progressive dyspnea.  Left ventricular function was found to be normal.      He has severe disease in the first diagonal, the first obtuse marginal      and the distal circumflex.  5.  Status post coronary artery bypass graft surgery plus implantation of      pericardial tissue aortic valve January 2006.  6.  Class II congestive heart failure symptoms.  7.  Claudication which limits exercise capability.  He has symptoms from the      knees up to the buttocks.  8.  Hypertension.  9.  Dyslipidemia.   PROCEDURE:  July 29, 2005, implantation of Medtronic SIGMA single  chamber pacemaker set at VVI, Dr. Charlies Constable.  Patient had no post  procedural complications.   BRIEF HISTORY:  Mr. Goates is a 76 year old male seen by Dr. Juanda Chance July 23, 2005.  He has a history of bypass surgery January 2006, with  accompanying pericardial tissue  aortic valve replacement.  He has severe  left ventricular hypertrophy and diastolic dysfunction which translates to  congestive heart failure symptoms, class II.  He had post CABG atrial  fibrillation which is now chronic.  It is refractory to amiodarone.  It is  refractory to direct current cardioversion.   Patient presents with dizziness on two separate episodes.  His first was  about a week ago.  He awakened from sleep with severe pain under left  shoulder blade.  This was associated with diaphoresis and near syncope.  The  episode lasted four to five minutes and spontaneously resolved. He has had  no recurrence.  He denies associated chest pain or shortness of breath with  this.  He is on chronic Coumadin for atrial fibrillation.  The second  episode was Sunday, walking out of church, became dizzy, had to sit down in  his car several minutes before being able to drive.  In the office visit on July 23, 2005, atrial fibrillation with rates in  the 40s.  He had been exercising regularly until about two months ago and  started not feeling well doing this.  He stopped and has not been able to  get into an exercise program.  Electrocardiogram shows atrial fibrillation,  slow ventricular rate, 41 beats per minute.  He will have a 48-hour monitor  placed to see if he is having pauses with his slow atrial fibrillation and  he may end up needing a pacemaker (unfortunately results of the study are  not available on the chart at this time), however, one must assume that the  patient probably demonstrated either profound bradycardia or pauses as  suspected for the patient presents electively July 29, 2005, for  pacemaker implantation.  This was done by Dr. Juanda Chance.  As mentioned above, a  Medtronic device was implanted.  A single chamber pacemaker.  He has had no  complications post procedure.  Chest x-ray has been evaluated and shows the  single lead is in appropriate placement, no  pneumothorax, mild pulmonary  vascular congestion.  The pacemaker insertion site at the pocket is without  hematoma and is healing nicely without drainage or erythema.  Patient is  alert and oriented x3, is afebrile, ambulating well and ready for discharge.  The device has been interrogated by the Medtr[employer] technical personnel with  all values within normal limits.  The patient is 85% pacing and no changes  were made.   LABORATORY STUDIES:  Complete blood count on July 29, 2005, hemoglobin  11.7, hematocrit 35.5, white cells 10.9, platelets 287.  Serum electrolytes  July 29, 2005, sodium 137, potassium 4.5, chloride 98, carbonate 31, BUN  13, creatinine 1.1 and glucose 113.  PT is 16.4, INR 1.3, PTT 37.   Patient is asked not to drive for the next seven days.  He is asked to keep  his incision dry for the next seven days.  He is to call [phone] if he  experiences pain or swelling at the incision site.   DISCHARGE MEDICATIONS:  As before this hospitalization.  1.  Lisinopril 20 mg twice daily.  2.  Vytorin 10/40 one tablet daily at bedtime.  3.  Furosemide 80 mg twice daily.  4.  Enteric coated aspirin 81 mg daily.  5.  Glucophage SR 500 mg twice daily.  6.  Potassium chloride 10 mEq b.i.d.  7.  Lantus 85 units subcu every evening.  8.  NovoLog per sliding scale.  9.  Coumadin 5 mg daily.   This question of claudication which the patient brings up, would recommend  that we take some ankle brachial indices if this has not been done before.  I will call the office to see if we can arrange that on his next visit at  the pacer clinic.      Maple Mirza, P.A.    ______________________________  Charlies Constable, M.D. [facility]    GM/MEDQ  D:  07/30/2005  T:  07/30/2005  Job:  [id number]   cc:   Angelena Sole, M.D. [facility]  [phone] [place]  [place] [address]

## 2010-11-08 NOTE — Assessment & Plan Note (Signed)
[facility]                              CARDIOLOGY OFFICE NOTE   NAME:[name], [name]                       MRN:          [id number]  DATE:04/06/2006                            DOB:          [birth year]    PRIMARY CARE PHYSICIAN:  Dr. Willow [name].   CLINICAL HISTORY:  [name] is 76 years old and returned for management of  his congestive heart failure.  He had a tissue aortic valve replacement and  bypass surgery in 2006.  He also has atrial fibrillation, which has become  chronic and has a VVI pacemaker in place for this.  I saw him in early  September with increasing symptoms of shortness of breath thought related to  a combination of things, including mild volume overload and inadequately  controlled blood pressure and diastolic dysfunction.  We increased his Lasix  from 80 b.i.d. to 80 in the morning, 120 in the evening.  We had planned to  add Norvasc, but due to cost issues, he never got this filled.   He says he has done better since we saw him.  His shortness of breath is  improved and his blood pressure is down today.   PAST MEDICAL HISTORY:  Significant for hypertension, insulin dependent  diabetes, and hyperlipidemia.   CURRENT MEDICATIONS:  Lisinopril/hydrochlorothiazide.  Vytorin.  Coumadin.  NovoLog.  Lantus insulin.  Glucophage.  Aspirin.  Lasix.  Potassium.   EXAMINATION:  The blood pressure is 150/70 and the pulse is 68 and regular.  There is no venous distension.  The carotid pulses were full and without  bruits.  CHEST:  Clear.  CARDIAC:  Rhythm was regular.  Short systolic murmur at the left sternal  edge.  I could hear no diastolic murmur.  ABDOMEN:  Protuberant.  Soft with normal bowel sounds.  There is no  hepatosplenomegaly.  There is trace peripheral edema.   IMPRESSION:  1. Recent episode of mild volume overload/congestive heart failure now      improved.  2. Status post aortic valve replacement for aortic stenosis  and bypass      surgery, January 2006.  3. Chronic atrial fibrillation.  4. Status post Medtronic VVI pacemaker.  5. Insulin dependent diabetes.  6. Hypertension.  7. Hyperlipidemia.   RECOMMENDATIONS:  I think [name] is doing better.  He has lost a few  pounds and I do not detect any volume overload at present.  His blood  pressure is better, but it is sill not target.  I talk to him about  additional medications and costs are a big issue with him, so we will follow  this and see if it trends down rather than up, then maybe he will get by  without changing his  medications.  He is currently on the maximum dose of  lisinopril/hydrochlorothiazide.  I will plan to see him back in 6 months and  he will plan to followup with Dr. Drue [name] as well.            ______________________________  Seth Beals. Juanda Chance, MD, [facility]  BRB/MedQ  DD:  04/06/2006  DT:  04/06/2006  Job #:  [id number]

## 2010-11-08 NOTE — H&P (Signed)
[name], [name]                          ACCOUNT NO.:  [id number]   MEDICAL RECORD NO.:  [id number]                   PATIENT TYPE:  INP   LOCATION:  2035                                 FACILITY:  [facility]   PHYSICIAN:  Marrian Salvage. Freida Busman, M.D. [facility]            DATE OF BIRTH:  29-Jun-1934   DATE OF ADMISSION:  09/23/2003  DATE OF DISCHARGE:                                HISTORY & PHYSICAL   CARDIOLOGIST:  Dr. Charlies Constable.   PRIMARY MEDICAL DOCTOR:  Dr. Angelena [name] in [place].   CHIEF COMPLAINT:  Chest pain.   HISTORY OF PRESENT ILLNESS:  The patient is a 76 year old man with a history  of coronary artery disease, status post percutaneous coronary intervention  in the past, with his last cardiac catheterization in July of 2004 with  stenting at that time to the RCA.  He did well over the last 8 months  without recurrent chest pain, until about 2 weeks ago.  At that time, he  noted the onset of nonspecific malaise and generally feeling run down.  He  was taking his medications but did stop his proton pump inhibitor about 2  weeks ago at the request of his primary care physician because it did not  help with the cough symptoms that it had been started for.  Otherwise, no  significant changes in his life.   This morning, he awoke at 5 a.m. by a telephone call and at that time, noted  that he was diaphoretic.  He then developed left lateral chest pain under  his ribs which felt like a gas pain.  He noted that this was specifically  different from his prior anginal episodes which were primarily manifest as  shortness of breath.  It was persistent all day and as severe as 6/10.  He  took nitroglycerin sublingually x3 as well as Alka-Seltzer x2, both of which  helped somewhat.  He had no shortness of breath, no radiation to his arm.  It was non-pleuritic but occasionally was worse with movement.  Given the  severity of the symptoms and the fact that they did not resolve, he  presented  to the emergency department tonight.   In the ED, his vital signs were stable.  EKG showed minimal changes.  Cardiac markers were initially negative.  Given his history of heart  disease, decision was made to admit him for further evaluation.   PAST MEDICAL HISTORY:  1. Coronary artery disease, status post stenting of th left circumflex as     well as stenting of the RCA.  2. Diabetes.  3. Hypertension.  4. Hypokalemia.  5. Bradycardia with negative EP study.  6. Hyperlipidemia.   ALLERGIES:  No known drug allergies.   MEDICATIONS:  1. Zocor 20 mg nightly.  2. Aspirin 81 mg daily.  3. DynaCirc CR 5 mg daily.  4. Atenolol 100 mg daily.  5. Torsemide 50  mg daily.  6. Glucophage 1000 mg b.i.d.  7. Lantus 85 units nightly.  8. Diovan/hydrochlorothiazide 160 mg/12.5 mg 1 tablet b.i.d.  9. Potassium chloride 30 mEq b.i.d.   Protonix and Zetia were both discontinued on September 04, 2003.   SOCIAL HISTORY:  The patient lives in [place] with his wife.  He has a 40-  pack-year history of smoking but quit in February of 1990.  He has no  significant alcohol or drug history.  He is minimally active and describes  himself as a cough potato.   FAMILY HISTORY:  The patient's mother died of a cerebrovascular accident and  his father died of a myocardial infarction.  He has 4 sisters, 3 of whom  have been diagnosed with coronary artery disease.   REVIEW OF SYSTEMS:  Review of systems reviewed in detail.  Significant for  chest pain and abdominal pain.  Also, general malaise.  Otherwise, negative  for fevers, chills, sweats, weight change, adenopathy, rash, focal weakness,  nausea, vomiting, blood per rectum.  All other systems negative in detail.   ADVANCE DIRECTIVES:  The patient is a full code.   PHYSICAL EXAMINATION:  VITAL SIGNS:  Afebrile, pulse 63, blood pressure  106/40, respirations 14.  Weight 273 pounds and height 5 foot 9 inches.  His  saturation was 98% on 2 L.  GENERAL:   In general, the patient was in no apparent distress.  He is mildly  obese.  HEENT:  His dentition was normal and oral mucosa was moist.  NECK:  His thyroid was normal.  His neck was thick and jugular venous  pressure was not elevated.  He had radiated aortic murmur to his neck  bilaterally.  No significant lymphadenopathy.  HEART:  Heart is regular rate and rhythm with a normal S1 and a preserved  S2, with a 4/6 mid-peaking systolic murmur at the base.  LUNGS:  Lungs were clear to auscultation bilaterally, although somewhat  distant.  No rash.  ABDOMEN:  Abdomen was soft and nontender.  EXTREMITIES:  His extremities showed no significant edema with 2+ pulses in  the femorals and DPs.  Extremities were warm.  NEUROLOGIC:  He was alert and oriented without grossly abnormal neurologic  exam.   ACCESSORY CLINICAL DATA:  Chest x-ray within normal limits.   EKG with sinus bradycardia at 58 with normal leftward axis, first-degree A-V  block with a P-R of 260, QRS of 110 and normal QTc, no Q waves and  biphasic  T waves laterally as well as ST elevations anteriorly.  Compared to EKG on  January 02, 2003, there was no significant change with chronic 1-mm ST  elevations anteriorly and mildly biphasic T wave inversions in V6 which were  slightly changed and likely due to placement of leads.   LABORATORIES:  Hematocrit 40.  Sodium 133, potassium 3.2, creatinine 1.2,  glucose 221.  CK 3.5, troponin less than 0.05, PTT 36, INR 1.0.  White blood  cell count 12, platelets 292,000.   ASSESSMENT AND PLAN:  This is a 76 year old man with a history of  percutaneous coronary intervention to the right coronary artery as well as  left circumflex.  He presents with atypical chest pain unlike his prior  angina.  Etiology is unclear.  The patient believes it is due to  gastrointestinal symptoms, which is possible.  However, given his cardiac history, his symptoms are also concerning for ischemia.  He did  recently  stop his proton pump inhibitor, so  gastroesophageal reflux disease or peptic  ulcer disease is a possibility.  I have a low suspicion for aortic  dissection and his chest x-ray and blood pressure in both arms are  inconsistent with this.  At this point, we will admit him for rule out  myocardial infarction and further evaluation of this chest pain.   1. Chest pain:  Continue aspirin.  Intravenous heparin has been started per     protocol.  Beta blocker and angiotensin II receptor blocker continued.     Rule out myocardial infarction with serial cardiac markers and     electrocardiograms.  Telemetry.  Maalox and proton pump inhibitor have     been started.  Check liver function tests as well as amylase to rule out     pancreatitis.  He had a recent negative stress test in January of 2005     per report.  Consequently, my advise would be to proceed to a cardiac     catheterization to redefine his coronary anatomy, given his history as     well as his current symptoms.  He will be kept n.p.o. past midnight on     Sunday.  2. Cardiac murmur:  Likely aortic sclerosis.  Unable to access prior     echocardiography, however, on catheterization in the past, he has not had     significant aortic stenosis.  I have ordered a transesophageal     echocardiogram for Monday to further evaluate his valve disease.  He does     have some heart failure and is on torsemide as well as angiotensin II     receptor blocker and beta blocker for treatment.  3. Hypokalemia:  On chronic supplementation.  Likely due to his loop     diuretic.  I have increased his supplement to 40 mEq b.i.d..  4. Diabetes:  I will hold his Glucophage while in house for possible     catheterization.  His Lantus will be continued, but he will get a high     dose on Sunday night for n.p.o. status on Monday.  He has also been     written for sliding-scale insulin and 4-times-a-day fingersticks.   DISPOSITION:  Hopeful  evaluation by Monday with discharge either Monday or  Tuesday, pending the results of either stress testing or catheterization.                                                Marrian Salvage Freida Busman, M.D. [facility]    LAA/MEDQ  D:  09/23/2003  T:  09/25/2003  Job:  [id number]

## 2010-11-08 NOTE — Discharge Summary (Signed)
NAMECLEMENS, [name]                          ACCOUNT NO.:  [id number]   MEDICAL RECORD NO.:  [id number]                   PATIENT TYPE:  INP   LOCATION:  3739                                 FACILITY:  [facility]   PHYSICIAN:  Olga Millers, M.D.                DATE OF BIRTH:  19-Nov-1934   DATE OF ADMISSION:  12/30/2002  DATE OF DISCHARGE:  01/04/2003                           DISCHARGE SUMMARY - REFERRING   PROCEDURES:  1. Cardiac catheterization.  2. Coronary arteriogram.  3. Left ventriculogram.  4. Percutaneous transluminal coronary angioplasty and stent x 2.  5. Invasive electrophysiology study.   HOSPITAL COURSE:  [name] is a 76 year old male with a history of coronary  artery disease, diabetes, and hypertension.  He had a cardiac  catheterization in 2003 which showed luminal irregularities and no critical  disease with no restenosis of previous stent site.  His EF was 50%.  He had  approximately one hour of left upper extremity numbness and tingling with  diaphoresis.  He took sublingual nitroglycerin x 2 and developed dizziness.  He was admitted for further evaluation and treatment.   [name] had enzymes that were negative for MI and had a Cardiolite  performed on December 31, 2002.  The Cardiolite showed an EF of 46% and  dyskinesis at the left ventricular apex.  Multiple specked imaging showed no  evidence of inducible ischemia or scar.   The images were reviewed by the physician, and because of abnormal wall  motion, cardiac catheterization was indicated.  This was performed on January 02, 2003.   The cardiac catheterization shows nonobstructive disease in the LAD and  circumflex system, although there was a 70% lesion in the obtuse marginal  and in the distal circumflex.  The RCA, however, had a long 70-80% stenosis  in the mid and distal portion.  This is treated with PTCA and Sypher stent x  2.  The ventriculography showed preserved global systolic function.   Once  the two lesions were stented in his RCA, these symptoms improved.   [name] had been on Atenolol at 150 mg q.d. prior to admission and this  was continued.  However, Dr. Jarome Matin was called to see the  patient because of a near syncopal episode associated with diaphoresis and  bradycardia.  He had AV nodal rest with sinus beats in the idioventricular  rhythm.  This was associated with IV potassium.  The IV potassium was  discontinued and cardiac enzymes were rechecked which showed no significant  change.  The Atenolol was discontinued.  Because of this, electrophysiology  consult was called and he had an electrophysiology study.   The electrophysiology study showed a normal HV interval with no clear  indication for a permanent pacemaker, although he did have sluggish AV node  conduction.  It is possible that this was due to increased vagal tone.  His  Atenolol was resumed.  He had a head CT performed as well for further  evaluation of the dizziness and it showed mild chronic sphenoid sinusitis on  the right but no acute intracranial findings.   An additional problem with hypokalemia.  [name] had a potassium of 3.0  upon arrival to the hospital which decreased to a low of 2.8 despite  supplementation.  With the oral supplementation ineffective, this was why  the IV supplementation had been ordered in the first place.  However, he  continued to be supplemented up to 120 mEq q.d. and it was felt that he  would need some home supplementation.  He was on diuretics prior to  admission, Demodex 50 and a combination product that included HCT 12.5 and  these were continued.   By January 04, 2003, [name] was feeling much better.  With an  electrophysiology study that was negative and did not indicate any need for  a pacemaker with no further episodes of arm pain and chest discomfort, he  was considered stable for discharge on January 04, 2003.   LABORATORY DATA:  Hemoglobin  13.1, hematocrit 37.9, WBC 9.5, platelets  199,000.  Sodium 138, potassium 3.3, chloride 100, CO2 32, BUN 15,  creatinine 1.1, glucose 131.  Hemoglobin A1C 9.2.  Total cholesterol 137,  triglycerides 118, HDL 32, LDL 81.  UA negative.   Chest x-ray showed mild cardiac prominence and intravascular congestion  without congestive heart failure or pneumonia.   CONDITION ON DISCHARGE:  Improved.   DISCHARGE DIAGNOSES:  1. Anginal symptoms (arm discomfort), status post percutaneous transluminal     coronary angioplasty and stent with Sypher stent x 2 to the right     coronary artery this admission.  2. History of percutaneous transluminal coronary angioplasty and stent to     the circumflex with no in-stent restenosis by catheterization.  3. Preserved left ventricular function by catheterization this admission.  4. Diabetes.  5. Hypertension.  6. Bradycardia and presyncope possibly secondary to increased vagal tone.  7. Status post invasive electrophysiology study this admission with a normal     HV and no clear indication for pacemaker.  8. Hyperlipidemia/dyslipidemia.  9. Obstructive sleep apnea.   DISCHARGE INSTRUCTIONS:  1. His activity level is to be as tolerated.  He is to call the office for     problems with the catheterization site.  2. He is to stick to a low fat, diabetic diet.  3. He is to follow up with Dr. Charlies Constable and call for an appointment.   DISCHARGE MEDICATIONS:  1. Aspirin 81 mg q.d.  2. Tenormin 100 mg a.m. and 50 mg p.m.  3. Demodex 50 mg q.d.  4. Glucophage 500 mg b.i.d.  5. Zocor 20 mg q.d.  6. Lantus 75 units q.h.s.  7.     Diovan/HCT 150/12.5 mg q.d.  8. Nitroglycerin p.r.n.  9. Potassium is determined by doctor.      Lavella Hammock, P.A. [facility]                  Olga Millers, M.D.    RG/MEDQ  D:  03/07/2003  T:  03/07/2003  Job:  [id number]   cc:   Angelena Sole, M.D. [facility]   Charlies Constable, M.D.

## 2010-11-08 NOTE — H&P (Signed)
[name], [name]                ACCOUNT NO.:  [id number]   MEDICAL RECORD NO.:  [id number]           PATIENT TYPE:   LOCATION:                                 FACILITY:   PHYSICIAN:  Charlies Constable, M.D. [facility] DATE OF BIRTH:  18-Nov-1934   DATE OF ADMISSION:  09/11/2004  DATE OF DISCHARGE:                                HISTORY & PHYSICAL   SUMMARY OF HISTORY:  [name] is a 76 year old white male who was referred  back to the office for evaluation of continuing anemia post bypass surgery.  [name] underwent two vessel bypass grafting with saphenous vein graft to  the diagonal, saphenous vein graft to the distal circumflex as well as  aortic valve replacement with a 25 pericardial MAGNA valve on July 15, 2004.  Postoperatively, he developed complications of atrial fibrillation  associated with hypotension and bradycardia. His atrial fibrillation  persisted.  He underwent cardioversion, restoring normal sinus rhythm but  the following morning he was back in atrial fibrillation.  Amiodarone was  started as well as Coumadin.  Postoperatively, he also had some volume  overload which was treated with diuretics.  Since his discharge, his blood  counts have been slowly dropping.  On August 02, 2004, H&H was 10.0 and  30.8 with platelets 468, wbc 10.5.  On the 20th, H&H was 8.0 and 25.3 with  MCV of 77.5 and platelets 377, wbc 11.0.  Chemistry was also checked at that  time and showed a potassium of 3.2, sodium 140, BUN 23, creatinine 1.4.  On  the 22nd, H&H was rechecked and this was 8.0, 25.8, MCV 76.9 with MCHC of  31.2, platelets 292, wbc 11.6 and PT 2.4.  Iron was low at 16 with  transferrin of 272.8 and iron saturation at 4.2%.  Folate was 10.2.  Dr.  Juanda Chance had him follow up in the office today for possible admission for  transfusion.  [name] states that from a bypass standpoint, he seems to be  doing well.  However, since his hospitalization, he has continued to have  dyspnea on exertion, fatigue and not feeling well in general.  He explained  to Dr. Juanda Chance, has determined this could be related to his ongoing anemia or  related to the atrial fibrillation.  The patient states his INR in regard to  his Coumadin has been somewhat difficult to regulate and for the first time  last week it was 2.1.  [name] presents to the office today prepared to be  admitted for transfusion and possible cardioversion.   ALLERGIES:  [name] allergies include TOPROL XL and IV DYE.   MEDICATIONS:  1.  Diovan/HCTZ 160/12.5 daily.  2.  Coumadin 2.5 daily.  3.  Amiodarone 200 mg q.p.m.  4.  Potassium 80 mEq daily.  5.  NovoLog sliding scale.  6  Lantus 30 to 50 units q.p.m. depending on his sugar.  1.  Aspirin 81 mg daily.  2.  Furosemide 100 mg daily.  3.  Vytorin 10/40 daily.  4.  Dulcolax and stool softeners daily.  5.  Hydrocodone p.r.n.   PAST MEDICAL HISTORY:  1.  Insulin dependent diabetes.  During his hospitalization in January, his      hemoglobin A1C was elevated at 7.8.  2.  History of hyperlipidemia.  During his hospitalization on July 12, 2004, fasting lipids were 120, triglycerides 163, HDL 33, LDL 54.  TSH      during that admission was 0.27.  3.  Hypertension.  4.  Obesity.  5.  Moderate aortic stenosis with a 24 mm gradient prior to his aortic valve      replacement.  His catheterization on July 12, 2004, showed EF of 60%,      less than 10% in three RCA stents.  He had a 30% proximal circumflex,      less than 20% in circumflex stent, 80% OM branch, 80% distal circumflex,      80% diagonal I.  6.  Problems with CHF associated with above.   FAMILY HISTORY:  Notable for coronary artery disease, diabetes,  hypertension, and stroke.   SOCIAL HISTORY:  [place]  He  denies any tobacco use.   REVIEW OF SYMPTOMS:  As noted above, as well as problems with lower  extremity edema, right  greater than left, slightly improved at night.   PHYSICAL EXAMINATION:  GENERAL APPEARANCE:  A well-nourished, well-  developed, pleasant obese white male in no apparent distress.  He is  accompanied by his wife.  VITAL SIGNS:  Weight is 251, blood pressure 120/50, pulse 58 and irregular.  HEENT:  Notable for pale conjunctivae.  NECK:  Supple without thyromegaly, adenopathy, JVD or carotid bruits.  CHEST:  Symmetrical excursion, clear to auscultation but diminished breath  sounds.  CARDIOVASCULAR:  Irregular rate and rhythm, somewhat slow.  Did not  appreciate an S3, S4 or any murmurs.  ABDOMEN:  Obese, bowel sounds present without organomegaly, masses or  tenderness.  EXTREMITIES:  Negative cyanosis or clubbing.  He had bilateral edema, 1 to  2+ on the right, 1+ on the left.   IMPRESSION:  1.  Status post two vessel bypass surgery with aortic tissue valve      replacement as previously described.  2.  Postoperative atrial fibrillation, with slow ventricular rate.  3.  Postoperative persistent anemia.  History as previously described above.   DISPOSITION:  After discussing with Dr. Juanda Chance who is familiar with the  patient's recent difficulties, we will admit the patient for transfusion and  plans for TEE DC cardioversion on Thursday.  We will continue his home  medications, type and cross him for four units, transfuse two units this  evening with 40 mg of IV Lasix in between the units.  Recheck a CBC in the  morning for further possibility of transfusion.  Will also check all stools  for blood.  Will also begin iron therapy as well.  Consideration of a GI evaluation may  be needed for continued blood loss.  Dr. Juanda Chance will reassess his lower  extremity edema and we will review his chest x-ray to determine if the  patient needs further diuresis given his lower extremity edema.      EW/MEDQ  D:  09/11/2004  T:  09/11/2004  Job:  [id number]   cc:   Angelena Sole, M.D. [facility]   Evelene Croon, M.D.  [address]  [place]  [place] [address]  Fax: [phone]

## 2010-11-08 NOTE — Cardiovascular Report (Signed)
[name], BOTTS                          ACCOUNT NO.:  [id number]   MEDICAL RECORD NO.:  [id number]                   PATIENT TYPE:  INP   LOCATION:  2039                                 FACILITY:  [facility]   PHYSICIAN:  Arturo Morton. Riley Kill, M.D.             DATE OF BIRTH:  [birth year]   DATE OF PROCEDURE:  01/02/2003  DATE OF DISCHARGE:                              CARDIAC CATHETERIZATION   INDICATIONS FOR PROCEDURE:  Mr. Mcgregor is a delightful 76 year old gentleman  who presents with some recurrent arm pain.  He did not have shortness of  breath; however, he had a Cardiolite study with adenosine that did not  demonstrate definitive ischemia.  He has had previous stents to the  circumflex and right coronary artery.  Importantly, he had two 50% lesions  in the right one year ago, and Dr. Juanda Chance did intravascular ultrasound.  The  current study was done to assess coronary anatomy.   PROCEDURE:  1. Left heart catheterization.  2. Selective coronary angiography.  3. Selective left ventriculography.  4. Percutaneous transluminal coronary angioplasty and stenting of the right     coronary x2.   DESCRIPTION OF PROCEDURE:  The patient was brought to the catheterization  lab and prepped and draped in the usual fashion.  Through an anterior  puncture, the right femoral artery was entered.  A 6 French sheath was  initially placed.  Views of the left and right coronary arteries were  obtained in multiple angiographic projections.  The patient had known aortic  stenosis with a 20-mm gradient, but we were able to easily cross the valve  using a pigtail.  Ventriculography was then performed in the RAO projection  and a careful pullback performed.  I then reviewed the case with his wife  and subsequently also discussed it with Dr. Juanda Chance over the phone.  The  patient has some moderate aortic stenosis; however, he has had previous  stenting.  In the two areas that were crossed with the IVUS  catheter one  year ago, there was clear-cut progression of disease with now an 80%+ lesion  in the distal right coronary artery.  It was felt that this should be  approached initially, and Dr. Juanda Chance and both agreed on that plan.  We also  discussed the possibility of placing a pacemaker because of recent syncope  and rhythm conduction changes which are noted in the chart.  The patient was  agreeable to this plan, and the plans were to proceed.   A JR4 guiding catheter with side holes was utilized as was heparin and  Integrelin.  Plavix was given.  Hi-Torque Floppy was then placed in the  distal vessel.  Direct stenting was performed on both lesions using on the  distal lesion a 3.5 x 13 Cortis Cypher stent.  In the proximal lesion, we  used a 3.5 x 18 Cypher stent.  Because we knew  the previous IVUS findings,  the lesion was postdilated using a 4-mm Powersail balloon up to 16  atmospheres really throughout both with slightly less pressure on the edges.  The stents were taken up and appeared to have good apposition.  There was  excellent runoff.  All catheters were subsequently removed, and final  pictures were taken.  I then reviewed the films with the wife in the holding  area.   HEMODYNAMIC DATA:  1. Central aorta:  144/70, mean 97.  2. LV:  161/28.  3. Pullback gradient:  23 mm mean gradient across the aortic valve.   ANGIOGRAPHIC DATA:  1. Ventriculography in the RAO projection reveals preserved global systolic     function.  The aortic valve is calcified, but there is some motion of the     valve.  2. The left main coronary artery is free of critical disease.  3. The LAD demonstrates a slightly hypodense lesion in the midvessel     crossing a pretty severe area of calcification.  This would be graded as     about 40% overlying the takeoff of the diagonal.  The diagonal in some     views appears up to about 70%.  There is another 30-40% eccentric plaque     more distally.  4.  There was a ramus intermedius vessel which was free of critical disease.  5. The circumflex proper has a long stent with less than about 20%     narrowing.  There is a small marginal with its exodus from the stent with     about 70% ostial disease.  There was a fair amount of luminal     irregularity distally with about a 70% stenosis in the distal marginal.  6. The right coronary artery has a previously placed stent proximally that     is widely patent.  There is a 70% area of segmental plaquing at the     junction of the mid and distal vessel which is now stented.  The distal     vessel has an 80% stenosis just prior to the PDA takeoff, and this was     stented from 80% to 0%.  The more proximal lesion was stented from 70% to     0%.  Both appeared to be widely patent, with an excellent angiographic     appearance.   CONCLUSION:  1. Aortic stenosis of moderate degree as defined on previous     catheterizations and the current study.  2. Successful percutaneous stenting of tandem lesions of the right coronary     artery, clearly progressed severely from the previous study.  3. Continued patency of the previously placed stents.  4. Moderate disease of both the left anterior descending and distal     circumflex, as noted above.   DISPOSITION:  I will view the films with Dr. Juanda Chance.  The patient may well  require a pacemaker, and we will make this decision.  He will be moved to  the coronary care unit because of his recent problem.                                               Arturo Morton. Riley Kill, M.D.    TDS/MEDQ  D:  01/02/2003  T:  01/03/2003  Job:  [id number]  Charlies Constable, M.D.   Ruffin Frederick.  Ruthine Dose, M.D. [facility]   CV Laboratory   cc:   Charlies Constable, M.D.   Angelena Sole, M.D. [facility]   CV Laboratory

## 2010-11-08 NOTE — Op Note (Signed)
[name], [name]                ACCOUNT NO.:  [id number]   MEDICAL RECORD NO.:  [id number]          PATIENT TYPE:  INP   LOCATION:  3729                         FACILITY:  [facility]   PHYSICIAN:  Wainwright Bing, M.D.  DATE OF BIRTH:  10/02/1934   DATE OF PROCEDURE:  09/12/2004  DATE OF DISCHARGE:                                 OPERATIVE REPORT   PROCEDURE:  Direct current cardioversion.   OPERATOR:  Dr.  Bing.   1.  Anesthesia achieved with 125 mg of intravenous Pentothal per      anesthesiologist.   1.  A 200 joules synchronized defibrillator discharge delivered via A-P      pads.  The patient failed to convert to sinus rhythm.   1.  A second discharge of 200 joules was administered. Sinus rhythm was      achieved with intermittent type 1 second degree AV block.   1.  The patient tolerated the procedure well and was returned to the      recovery area, awake, responsive and appropriate.      RR/MEDQ  D:  09/12/2004  T:  09/12/2004  Job:  [id number]

## 2010-11-08 NOTE — Op Note (Signed)
[name], Seth                          ACCOUNT NO.:  [id number]   MEDICAL RECORD NO.:  [id number]                   PATIENT TYPE:  INP   LOCATION:  3739                                 FACILITY:  [facility]   PHYSICIAN:  Doylene Canning. Ladona Ridgel, M.D.               DATE OF BIRTH:  [birth year]   DATE OF PROCEDURE:  01/04/2003  DATE OF DISCHARGE:                                 OPERATIVE REPORT   PROCEDURE PERFORMED:  Measurement of the HV interval with His bundle  recording and rapid atrial pacing.   INDICATIONS FOR PROCEDURE:  Syncope with transient high grade AV block and  baseline first degree heart block.   I:  HISTORY:  The patient is a very pleasant 76 year old man with coronary  disease status post catheterization.  He has hypokalemia and during IV  infusion of potassium developed severe pain in his arm and had transient  syncope associated with the development of high grade AV block.  This  resolved spontaneously.  He has had only one additional episode of syncope  occurring during painful stimuli in the last several years.  He is now  referred for recording of his HV interval and atrial pacing to determine  whether the patient is at high risk for development of heart block and the  need for permanent pacemaker.   II:  PROCEDURE:  After informed consent was obtained, the patient was taken  to the diagnostic EP lab in a fasting state.  After the usual preparation  and draping, a total of 30 mL of lidocaine was infiltrated in the right  groin.  A 5 French quadripolar catheter was inserted percutaneously in the  right femoral vein and advanced to the His bundle region.  Measurements of  the HV interval were found to be 55 milliseconds.  Rapid atrial pacing was  then carried out from the high right atrium which demonstrated an AV  Wenckebach cycle length of 680 milliseconds.  Next, programmed atrial  fibrillation was carried out from the coronary sinus with a base cycle  length of  700 milliseconds and the S1 and S2 interval step wise decreased  down to 630 milliseconds where AV nodal ERP was observed.  During programmed  atrial stimulation, there is no inducible SVT.  At this point, the catheters  were removed, hemostasis was insured, and the patient returned to his room  in satisfactory condition.   III:  COMPLICATIONS:  There were no immediate procedure complications.   IV:  RESULTS:  A.  Baseline ECG.  The baseline ECG demonstrates normal sinus rhythm with  first degree heart block.  B.  Baseline intervals:  HV interval 55 milliseconds with a QRS duration of  120 milliseconds and the PR interval 270 milliseconds.  C.  Rapid atrial pacing.  Rapid atrial pacing demonstrated an AV Wenckebach  cycle length of 680 milliseconds.  D.  Programmed atrial stimulation.  Programmed atrial stimulation with a  base cycle length of 700 milliseconds was carried out.  The S1 and S2  interval was stepwise decreased from 640 milliseconds down to 630  milliseconds where AV nodal ARP was observed.  During programmed atrial  stimulation, there was no inducible SVT.   V:  CONCLUSION:  The study demonstrates a normal  HD interval with sluggish  AV node conduction, questionable high vagal tone.  At the present time,  there is no compelling indication for permanent pacemaker insertion.                                               Doylene Canning. Ladona Ridgel, M.D.    GWT/MEDQ  D:  01/04/2003  T:  01/04/2003  Job:  [id number]   cc:   Charlies Constable, M.D.   Angelena Sole, M.D. [facility]

## 2010-11-08 NOTE — Discharge Summary (Signed)
[name], [name]                          ACCOUNT NO.:  [id number]   MEDICAL RECORD NO.:  [id number]                   PATIENT TYPE:  INP   LOCATION:  2035                                 FACILITY:  [facility]   PHYSICIAN:  Charlies Constable, M.D. [facility]              DATE OF BIRTH:  [birth year]   DATE OF ADMISSION:  09/23/2003  DATE OF DISCHARGE:  09/26/2003                                 DISCHARGE SUMMARY   PROCEDURE:  1. Cardiac catheterization.  2. Coronary arteriogram.  3. Left ventriculogram.   HOSPITAL COURSE:  [name] is a 76 year old male with a history of coronary  artery disease.  His last cath was in July 2004 and approximately 2 weeks  prior to admission   DICTATION ENDED AT THIS POINT      Theodore Demark, P.A. [facility]                  Charlies Constable, M.D. [facility]    RB/MEDQ  D:  09/26/2003  T:  09/26/2003  Job:  [id number]

## 2010-11-08 NOTE — Discharge Summary (Signed)
[name], [name]                          ACCOUNT NO.:  [id number]   MEDICAL RECORD NO.:  [id number]                   PATIENT TYPE:  INP   LOCATION:  2035                                 FACILITY:  [facility]   PHYSICIAN:  Charlies Constable, M.D. [facility]              DATE OF BIRTH:  11-25-1934   DATE OF ADMISSION:  09/23/2003  DATE OF DISCHARGE:  09/26/2003                                 DISCHARGE SUMMARY   PROCEDURES:  1. Cardiac catheterization.  2. Coronary arteriogram.  3. Left ventriculogram.   HOSPITAL COURSE:  [name] is a 76 year old male with a history of coronary  artery disease.  His last catheterization was in July of 2004 and he had two  stents at that time.  About two weeks ago, he began having general malaise  and fatigue.  He awoke at 5 a.m. on the day of admission with chest pain.  He took a total of three sublingual nitroglycerin and two Alka Seltzers  during the day.  He felt he got more relief with the nitroglycerin than he  did with the Alka Seltzer but the pain was persistent.  He came to the  emergency room and was admitted for further evaluation and treatment.   His CK-MBs were negative but his troponins were mildly elevated at 0.15 and  0.16.  It was felt that cardiac catheterization was indicated to further  define his anatomy and this was scheduled for September 25, 2003.   [name] had multiple noncritical lesions between 30 and 60% in the  branches of the diagonal circumflex, OM, and RCA.  A small branch of the RCA  had a 90% stenosis.  His EF was normal.  His ABG was 24.  Dr. Riley Kill  reviewed the films and felt that he had diffuse LAD and circumflex disease  and reviewed the films with Dr. Juanda Chance.  Dr. Juanda Chance felt that he had  moderate AS and that there was some progression in his coronary disease but  medical therapy was the best option.  He had Plavix added to his medication  regimen and is to be followed closely.  He is to get an outpatient  echocardiogram  to further assess his AS.  [name] was considered stable  for discharge on September 26, 2003, with outpatient follow-up arranged.   LABORATORY DATA:  Hemoglobin 11.6, hematocrit 35.0, wbc 8.2, platelets 269.  Sodium 139, potassium 3.7, chloride 101, CO2 33, BUN 11, creatinine 1.2,  glucose 148.  Other CMET values within normal limits.  Amylase 52.  Magnesium 2.0.  BNP 173.9.  Total cholesterol 134, triglycerides 142, HDL  31, LDL 75.   Chest x-ray showed cardiac enlargement but no active disease.   CONDITION ON DISCHARGE:  Improved.   DISCHARGE DIAGNOSES:  1. Chest pain, anginal pain, medical therapy recommended.  2. Diabetes mellitus.  3. Status post percutaneous intervention to the right coronary artery and  percutaneous intervention to the circumflex.  4. Gastroesophageal reflux disease symptoms.  5. Hypertension.  6. Hypokalemia.  7. Bradycardia with negative electrophysiological study.  8. Hyperlipidemia.  9. Family history of coronary artery disease.  10.      Remote history of tobacco use.   DISCHARGE INSTRUCTIONS:  1. His activity level is to include no driving or strenuous activity for two     days.  2. He is to stick to a low fat diabetic diet.  3. He is to call the office for problems with the catheterization site.  4. He is to get an echo on Monday, October 09, 2003, at 1 p.m. and to follow     up with Dr. Juanda Chance on Friday, October 13, 2002, at 4 p.m.   DISCHARGE MEDICATIONS:  1. Zocor 20 mg daily.  2. Aspirin 81 mg daily.  3. Atenolol 50 mg daily.  4. Lantus 85 units q.h.s.  5. Diovan/hydrochlorothiazide 150/12.5.  6. K-Dur as prior to admission.  7. Demadex 50 mg q.a.m.  8. DynaCirc 5 mg daily.  9. Plavix 75 mg daily.  10.      Glucophage 1000 mg b.i.d.  11.      Nitroglycerin sublingual p.r.n.      Theodore Demark, P.A. [facility]                  Charlies Constable, M.D. [facility]    RB/MEDQ  D:  09/26/2003  T:  09/27/2003  Job:  [id number]   cc:   Angelena Sole, M.D. [facility]

## 2010-11-08 NOTE — Cardiovascular Report (Signed)
[name], [name]                ACCOUNT NO.:  [id number]   MEDICAL RECORD NO.:  [id number]          PATIENT TYPE:  INP   LOCATION:  3729                         FACILITY:  [facility]   PHYSICIAN:  Charlies Constable, M.D. [facility] DATE OF BIRTH:  05/03/1935   DATE OF PROCEDURE:  DATE OF DISCHARGE:  09/13/2004                              CARDIAC CATHETERIZATION   Audio too short to transcribe (less than 5 seconds)      BB/MEDQ  D:  09/25/2004  T:  09/25/2004  Job:  [id number]

## 2010-11-08 NOTE — H&P (Signed)
[name], [name]                ACCOUNT NO.:  [id number]   MEDICAL RECORD NO.:  [id number]          PATIENT TYPE:  OIB   LOCATION:  2899                         FACILITY:  [facility]   PHYSICIAN:  Charlies Constable, M.D. [facility] DATE OF BIRTH:  January 08, 1935   DATE OF ADMISSION:  07/11/2004  DATE OF DISCHARGE:                                HISTORY & PHYSICAL   CHIEF COMPLAINT:  Shortness of breath.   HISTORY:  Mr. Hume is 76 years old and has documented coronary disease with  multiple percutaneous coronary interventions.  His last cath was in  April  of 2005 at which time he had patent stents in the right coronary artery and  nonobstructing disease in the LAD and circumflex arteries.  He also has  aortic stenosis and has a mean aortic valve of 24 at time of that  catheterization.   I had seen him recently with increasing symptoms of shortness of breath with  exertion which were similar to his symptoms that he had had prior to his  previous interventions.  He was scheduled to come in for a same day  catheterization today, but when he arrived in the holding area he had  increasing shortness of breath.  He said this began at about 2 a.m. when he  woke up with shortness of breath and was not able to get back to sleep.  He  had no chest pain.   Gene Serpe saw him earlier and treated him with Lasix and his symptoms have  improved.   PAST MEDICAL HISTORY:  Significant for insulin dependent diabetes mellitus,  hypertension, hyperlipidemia.   CURRENT MEDICATIONS:  1.  Aspirin 81 mg.  2.  Torsemide 50 mg daily.  3.  Atenolol 100 mg daily.  4.  Diovan/hydrochlorothiazide 160/12.5 mg twice daily.  5.  Lantus insulin 85 mg daily.  6.  Plavix 75 mg daily.  7.  Glucophage 100 mg twice daily which was held.  8.  NovoLog 70/30 10 twice daily as needed.  9.  Potassium 10 mEq 5 tablets twice daily.  10. Vytorin 10/40 daily.   FAMILY HISTORY:  Positive for coronary heart disease.   SOCIAL HISTORY:   The patient is married, lives with his wife and does not  smoke.   For details of family history and social history, please see previous notes.   PHYSICAL EXAMINATION:  VITAL SIGNS:  On examination today his blood pressure  was 191/94 and pulse 62 and regular.  Respiratory rate was 36 and there was  mild respiratory distress.  Oxygen saturation was 92% on 2 L.  NECK:  The neck veins were elevated.  The carotid pulses were equal and  there were no bruits.  CHEST:  Decreased breath sounds at the bases and crackles at both bases.  HEART:  Cardiac rhythm was regular.  There was a grade 3/6 harsh systolic  murmur at the left sternal edge radiating to the base and into the neck.  ABDOMEN:  Protuberant.  The abdomen was soft with normal bowel sounds, no  hepatosplenomegaly.  EXTREMITIES:  2+ dorsalis pedis pulses.  There was trace to 1+ pedal edema.  MUSCULOSKELETAL:  No deformities.  SKIN:  Warm and dry.  NEURO:  No focal neurological findings.   IMPRESSION:  1.  Shortness of breath secondary to new onset of congestive heart failure,      now improved following IV Lasix.  2.  Coronary artery disease status post multiple percutaneous interventions      as described above.  3.  Moderate aortic stenosis, mean aortic valve gradient 24 mm at last cath.  4.  Good left ventricular function.  5.  Insulin dependent diabetes.  6.  Hypertension.  7.  Hyperlipidemia.   RECOMMENDATIONS:  Will plan to admit Mr. Deviney and continue diuresis.  Continue his other medications.  I think we will be able to proceed with  catheterization tomorrow.       BB/MEDQ  D:  07/11/2004  T:  07/11/2004  Job:  [id number]   cc:   Angelena Sole, M.D. [facility]

## 2010-11-08 NOTE — Discharge Summary (Signed)
NAMESEICHI, KAUFHOLD                ACCOUNT NO.:  [id number]   MEDICAL RECORD NO.:  [id number]          PATIENT TYPE:  INP   LOCATION:  2025                         FACILITY:  [facility]   PHYSICIAN:  Evelene Croon, M.D.     DATE OF BIRTH:  [birth year]   DATE OF ADMISSION:  07/11/2004  DATE OF DISCHARGE:  07/25/2004                                 DISCHARGE SUMMARY   ADMITTING DIAGNOSIS:  Congestive heart failure.   PAST MEDICAL HISTORY:  1.  Congestive heart failure with preserved left ventricular function.  2.  Coronary artery disease, status post multiple percutaneous interventions      including stenting of right coronary artery and circumflex.  Last      catheterization April 2005.  Nonischemic stress Cardiolite January 2005.  3.  Moderate aortic stenosis.  4.  Diabetes mellitus, type 2 now insulin dependent.  5.  Dyslipidemia.  6.  Hypertension.  7.  First degree AV block.   DISCHARGE DIAGNOSES:  1.  Congestive heart failure, resolved.  2.  Coronary artery disease and moderate-to-severe aortic valve stenosis,      status post coronary artery bypass graft and aortic valve replacement.  3.  Postoperative atrial fibrillation, attempted electrical cardioversion      now on Coumadin therapy.   BRIEF HISTORY:  Mr. Cass is a 76 year old Caucasian male.  He presented to  [facility] on July 11, 2004 for elective cardiac  catheterization.  On arrival he was noted to be in congestive heart failure.  He was admitted and treated with Lasix and discharged with improvement.  His  catheterization was performed on January 20th.  This revealed significant 3-  vessel coronary artery disease as well as progression of his aortic  stenosis.  Cardiac surgery consultation was requested and he was seen later  in the day by Dr. Evelene Croon.  After examination of the patient and review  of all available records Dr. [name] agreed to the procedure with coronary  artery bypass grafting and  aortic valve replacements per treatment choice  __________ .  The procedure, risks and benefits were all discussed with Mr.  Manning and he agreed to proceed with surgery.   Preoperative arterial evaluation included carotid duplex scan which revealed  no significant carotid artery disease.  Lower extremity exam included ankle  brachial indices.  They were 0.72 on the right, greater than 1.0 on the  left.  Mr. Barbier remained stable in the hospital while awaiting surgery.   On July 15, 2004 Mr. Gillie underwent the above surgical procedure with  Dr. Evelene Croon:  Coronary artery bypass grafting x2.  Grafts were placed  at the time of the procedure.  Saphenous veins grafted to the diagonal,  saphenous veins grafted to the distal left circumflex artery. Procedure #2:  Aortic valve replacement with a 25-mm pericardial magna valve.  He tolerated  this procedure well transferring in stable condition to the SICU.  He  remained hemodynamically stable in the immediate postoperative period.  He  was extubated several hours after arrival into the intensive care unit and  awoke from anesthesia neurologically intact.  His diabetes was treated with  a Glucommander protocol.  In the immediate perioperative period he was  switched to Lantus insulin and sliding scale coverage on postoperative day  1.  His CBGs have remained acceptable throughout his hospital stay.  His  admitting hemoglobin A1c was 7.8.   In the early morning hours of postoperative day 2 his heart went into atrial  fibrillation. He did not tolerate this very well, exhibited by his low blood  pressure.  He did have a slow ventricular rate, IV amiodarone and so his  ARBs, beta blockers and diuretics were held.  He did not convert to sinus  rhythm in 24 hours and so Coumadin therapy was initiated.  Dr. [name]  favored electrical cardioversion and so we asked for cardiology consultation  regarding this.  Dr. Juanda Chance agreed that cardioversion  is the appropriate  course and he was scheduled for Monday, January 30.   Over the weekend he continued in atrial fibrillation.  He developed  significant nausea with his amiodarone and since it did not convert him to  normal sinus rhythm amiodarone was discontinued.  His nausea resolved by the  next morning.  On the morning of January 31, Mr. Drummer underwent electrical  cardioversion.  He initially converted to sinus rhythm, but by the next  morning he reverted to atrial fibrillation.  Both Dr. Juanda Chance and Dr. [name]  favor resuming amiodarone at a lower dose in an attempt to convert him to  sinus rhythm, if this is not successful. He can be electrocardioverted as an  outpatient.   Mr. Frate, [place], is making good progress in recovering from this surgery.  His vital signs have been stable, this morning, on postoperative day 9,  February 1.  Blood pressure 141/74; he is afebrile; oxygen saturation is  95%.  His lungs are clear to auscultation. His incisions are intact.  He  does have some serous drainage from his chest incision.  He does have some  remaining leg edema from his volume overload.  This is improving.  He has  been ambulating with the assistance of the cardiac rehab services.  He is  making progress with this.  Currently he is awaiting evaluation by case  management for discharge planning.  His wife was recently admitted to [facility] and will not be available to assist him at home.  When  appropriate arrangements are in place, Mr. Aber will be discharged.   CONDITION ON DISCHARGE:  Improved.   DISCHARGE MEDICATIONS:  1.  Enteric coated aspirin 81 mg daily.  2.  Atenolol 25 mg daily.  3.  Diovan/HCTZ 160/12.5 mg b.i.d.  4.  Vytorin 10/40 mg daily.  5.  Plavix 75 mg daily.  6.  Coumadin-dose to be determined at discharge.  7.  Lantus insulin 30 units each evening.  8.  Insulin sliding scale.  9.  Lasix 40 mg daily.  10. Potassium chloride 20 mEq daily. 11.  For pain management he may have Ultram 50 mg 1-2 q.6h. for moderate-to-      severe pain or Tylenol 325 mg 1-2 q.4h. for mild pain.   INSTRUCTIONS ON DISCHARGE:  1.  Activity-he is asked to refrain from any driving, or any heavy lifting      pushing or pulling.  Also instructed to continue his breathing exercises      and daily walking.  2.  His diet should continue to be a carbohydrate modified, __________  calorie diet.  3.  Wound care-he should shower daily with mild soap and water.  If his      incision shows any sign of infection he is to call Dr. Sharee Pimple office.   FOLLOWUP:  1.  PT/INR blood work for Coumadin.  Followup will be through the [facility].  An appointment will be arranged prior to      his discharge.  2.  He has an appointment to see his cardiologist on February 16 at 11 a.m.      He will have a chest x-ray taken that      day.  3.  Dr. [name] would like to see him back at the CVTS office on Tuesday      February 21, at 11:15 a.m.  He will be asked to bring his chest x-ray      from the cardiology office for Dr. [name] to review.      CTK/MEDQ  D:  07/24/2004  T:  07/24/2004  Job:  [id number]   cc:   Evelene Croon, M.D.  [address]  [place]  [place] [address]  Fax: [phone]   Charlies Constable, M.D. [facility]   Angelena Sole, M.D. [facility]

## 2010-11-08 NOTE — H&P (Signed)
[name], [name]                          ACCOUNT NO.:  [id number]   MEDICAL RECORD NO.:  [id number]                   PATIENT TYPE:  EMS   LOCATION:  MAJO                                 FACILITY:  [facility]   PHYSICIAN:  Olga Millers, M.D.                DATE OF BIRTH:  Oct 21, 1934   DATE OF ADMISSION:  12/30/2002  DATE OF DISCHARGE:                                HISTORY & PHYSICAL   HISTORY OF PRESENT ILLNESS:  [name] is a 76 year old male with past  medical history of coronary disease, diabetes mellitus, hypertension,  hyperlipidemia, obstructive sleep apnea who presents with complaints of left  upper extremity pain.  The patient has had prior intervention to his  circumflex and right coronary artery.  His most recent catheterization was  in May 2003.  At that time, he was found to have a normal left main.  The  LAD had luminal irregularities.  The circumflex had a 10% lesion at the  previous stent site and a 40% distal lesion.  There was a 10% proximal right  coronary artery at the previous stent site and a 50% mid and a 50% distal.  The ejection fraction was 50%.  There was mild aortic stenosis with a peak-  to-peak gradient of 25 mmHg.  Since that time, he has done well.  He does  have some dyspnea on exertion, but this has improved since he has lost  weight.  He denies any orthopnea, PND, p.o. edema, exertional chest pain.  At approximately noon today after awakening from a nap, he developed left  upper extremity numbness and tingling associated with diaphoresis.  There  was no chest pain, shortness of breath, nausea or vomiting.  The patient  took two sublingual nitroglycerin and subsequently developed dizziness.  However, his symptoms resolved after approximately one hour and he is now  asymptomatic.  He denies any weakness or loss of sensation in his  extremities, dysarthria or visual disturbance.   MEDICATIONS:  His medications at home include:  1. Aspirin 81 mg  p.o. daily.  2. Atenolol 100 mg p.o. q.a.m. and 50 mg p.o. q.p.m.  3. Demadex 50 mg p.o. daily.  4. Glucophage 5000 mg p.o. b.i.d.  5. Zocor 20 mg p.o. q.h.s.  6. Lantus insulin 75 units subcu q.h.s.  7. Diovan ACT 160/12.5 mg one p.o. b.i.d.  8. DynaCirc CR 5 mg p.o. daily.   ALLERGIES:  No known drug allergies.   SOCIAL HISTORY:  He has a remote history of tobacco use but none since 56.  He rarely consumes alcohol.   FAMILY HISTORY:  Positive for coronary artery disease.   PAST MEDICAL HISTORY:  Significant for diabetes mellitus, hypertension,  hyperlipidemia.  He has obstructive sleep apnea and is being followed by Dr.  Sherene Sires for a questioned lung nodule.  His cardiac history is listed in the  HPI.  He has history  of nephrolithiasis.  He has had prior surgery for  rotator cuff injury on the right.   REVIEW OF SYSTEMS:  Denies any headaches, fevers or chills.  There is no  productive cough or hemoptysis.  No dysphagia, odynophagia, melena or  hematochezia.  There is no nocturia or hematuria.  There is no rash or  seizure activity.  There is chronic dyspnea on exertion but no orthopnea,  PND or pedal edema.  Remainder of systems are negative.   PHYSICAL EXAMINATION:  VITAL SIGNS:  Pulse 60, blood pressure 98/60.  GENERAL APPEARANCE:  Well-developed and obese.  He is in no acute distress.  His skin is warm and dry.  He does not appear to be depressed.  There is no  peripheral clubbing.  HEENT:  Unremarkable with __________.  NECK:  Supple __________  bilaterally.  The upstroke appears to be normal.  There are note of murmur radiating from the cardiac exam.  CHEST:  Clear to auscultation.  No distention.  CARDIOVASCULAR:  Regular rate and rhythm.  There is a 3/6 systolic murmur at  the left sternal border with radiation to the carotid as outlined above.  S2  is diminished.  ABDOMEN:  Nontender, nondistended.  Positive bowel sounds.  No  hepatosplenomegaly.  No masses  appreciated.  There is no abdominal bruit.  He has 2+ femoral pulses bilateral and no bruits.  EXTREMITIES:  No edema, and I could palpate no cords.  He has 2+ dorsalis  pedis pulses bilaterally.  NEUROLOGICAL:  Grossly intact.   ELECTROCARDIOGRAM:  Normal sinus rhythm at a rate of 60.  The axis is to the  left.  There is a first degree AV block.  There is poor R wave progression  and anterior MI cannot be excluded.  He has lateral T wave inversion. I do  not have an old EKG for comparison. His initial enzymes are negative.  His  BUN and creatinine by I-stat show a creatinine of 1.6 and BUN of 19.   DIAGNOSES:  1. Atypical left upper extremity pain.  2. History of coronary disease.  3. Diabetes mellitus.  4. Hypertension.  5. Hyperlipidemia.  6. History of nephrolithiasis.  7. History of obstructive sleep apnea.   PLAN:  [name] presents with left upper extremity pain of uncertain  etiology.  We will admit and rule out myocardial infarction with serial  enzymes.  If they are negative, then we will plan to re-stratify with  Adenosine Cardiolite.  We will continue with his aspirin and beta blockade,  but I will discontinue his DynaCirc CR as his blood pressure is somewhat  low.  He does have significant aortic stenosis on exam, although was mild by  his catheterization one year ago.  He will need an outpatient echocardiogram  to follow up on this issue.  He also did describe left upper extremity  numbness and tingling, and I cannot exclude transient ischemic attack.  We  will need to also follow up with carotid Dopplers.  We will make further  recommendations once we have that information.                                                Olga Millers, M.D.    BC/MEDQ  D:  12/30/2002  T:  12/31/2002  Job:  [id number]

## 2010-11-12 ENCOUNTER — Telehealth: Payer: Self-pay | Admitting: Internal Medicine

## 2010-11-12 NOTE — Telephone Encounter (Signed)
Patient was seen a few weeks ago for CPX on 10/10/2010---per spouse [name] paperwork has been sent in for assistance for Insulin and Apedra  (they both use both medications)   [name] says she is out of meds and [name] is almost out of meds--on his last bottle of insulin  [name] has spoken to assistance program and they need [place] to fax a copy of [place] Dr[place] state license and the expiration date to [phone]      Assistance program phone number is [phone]  In the meantime, can they pick up insulin pen until assistance program comes thru???   Please call [name] with response as soon as possible

## 2010-11-15 NOTE — Telephone Encounter (Signed)
Spoke w/ pt wife says that she believes he has enough medication to last him the weekend and that medication should be coming in 7 days.

## 2010-11-22 ENCOUNTER — Other Ambulatory Visit: Payer: Self-pay | Admitting: Hematology & Oncology

## 2010-11-22 ENCOUNTER — Encounter: Payer: Medicare Other | Admitting: *Deleted

## 2010-11-22 ENCOUNTER — Ambulatory Visit (HOSPITAL_BASED_OUTPATIENT_CLINIC_OR_DEPARTMENT_OTHER): Payer: Medicare Other | Admitting: Hematology & Oncology

## 2010-11-22 DIAGNOSIS — D72829 Elevated white blood cell count, unspecified: Secondary | ICD-10-CM

## 2010-11-22 DIAGNOSIS — E119 Type 2 diabetes mellitus without complications: Secondary | ICD-10-CM

## 2010-11-22 DIAGNOSIS — I10 Essential (primary) hypertension: Secondary | ICD-10-CM

## 2010-11-22 DIAGNOSIS — I2510 Atherosclerotic heart disease of native coronary artery without angina pectoris: Secondary | ICD-10-CM

## 2010-11-22 LAB — CBC WITH DIFFERENTIAL (CANCER CENTER ONLY)
Eosinophils Absolute: 0.4 10*3/uL (ref 0.0–0.5)
HCT: 42.6 % (ref 38.7–49.9)
LYMPH%: 23.7 % (ref 14.0–48.0)
MCH: 27.2 pg — ABNORMAL LOW (ref 28.0–33.4)
MCV: 82 fL (ref 82–98)
MONO#: 0.9 10*3/uL (ref 0.1–0.9)
NEUT%: 63.7 % (ref 40.0–80.0)
RDW: 16.3 % — ABNORMAL HIGH (ref 11.1–15.7)
WBC: 10.3 10*3/uL — ABNORMAL HIGH (ref 4.0–10.0)

## 2010-11-28 ENCOUNTER — Other Ambulatory Visit: Payer: Self-pay | Admitting: Internal Medicine

## 2010-11-28 ENCOUNTER — Other Ambulatory Visit (HOSPITAL_COMMUNITY): Payer: Medicare Other | Admitting: Radiology

## 2010-11-28 ENCOUNTER — Encounter: Payer: Medicare Other | Admitting: *Deleted

## 2010-11-28 ENCOUNTER — Ambulatory Visit (INDEPENDENT_AMBULATORY_CARE_PROVIDER_SITE_OTHER): Payer: Medicare Other | Admitting: *Deleted

## 2010-11-28 DIAGNOSIS — I495 Sick sinus syndrome: Secondary | ICD-10-CM

## 2010-11-28 DIAGNOSIS — I4891 Unspecified atrial fibrillation: Secondary | ICD-10-CM

## 2010-11-28 DIAGNOSIS — I359 Nonrheumatic aortic valve disorder, unspecified: Secondary | ICD-10-CM

## 2010-11-28 LAB — POCT INR: INR: 2.3

## 2010-11-28 NOTE — Progress Notes (Signed)
Pacer check

## 2010-12-13 ENCOUNTER — Encounter: Payer: Medicare Other | Admitting: *Deleted

## 2010-12-13 ENCOUNTER — Other Ambulatory Visit (HOSPITAL_COMMUNITY): Payer: Medicare Other | Admitting: Radiology

## 2010-12-16 ENCOUNTER — Ambulatory Visit (INDEPENDENT_AMBULATORY_CARE_PROVIDER_SITE_OTHER): Payer: Medicare Other | Admitting: Internal Medicine

## 2010-12-16 ENCOUNTER — Telehealth: Payer: Self-pay | Admitting: Cardiology

## 2010-12-16 ENCOUNTER — Encounter: Payer: Self-pay | Admitting: Internal Medicine

## 2010-12-16 VITALS — BP 126/84 | HR 91 | Wt 243.2 lb

## 2010-12-16 DIAGNOSIS — R209 Unspecified disturbances of skin sensation: Secondary | ICD-10-CM

## 2010-12-16 DIAGNOSIS — R202 Paresthesia of skin: Secondary | ICD-10-CM

## 2010-12-16 NOTE — Progress Notes (Signed)
  Subjective:    Patient ID: [name], male    DOB: April 23, 1935, 76 y.o.   MRN: [id number]  HPI Symptoms started 4 days ago, patient can't tell if he woke up with the right upper extremity paresthesia or if he developed it during the day. From the shoulder down his right arm is numb, tingling. No other symptoms, no other areas with paresthesias.  Past Medical History  Diagnosis Date  . Erectile dysfunction   . Obesity   . OSA (obstructive sleep apnea)     not using CPAP  . History of nephrolithiasis   . DM type 2 (diabetes mellitus, type 2)     used to see Dr Roanna Raider, now f/u by Dr Drue Novel  . HTN (hypertension)   . Aortal stenosis 2006    biophrostetic  . Atrial fibrillation     onset after CABG--- [facility]  . S/P aortic valve replacement   . CHF (congestive heart failure)     abter CABG  . Pacemaker 2/07    VVI  . PVD (peripheral vascular disease)     CT angio showed- vascular insuff, intestine and RAS bilaterally, andgiogram 02/2009 severe PVD medical management  . Nephrolithiasis   . Skin cancer     surgery (nose) 09-2010   Past Surgical History  Procedure Date  . Aortic valve replacement     biophrostetic for Ao Stenosis 2006  . Coronary artery bypass graft 2006  . Tonsillectomy   . Cataract extraction, bilateral   . Amputation 01/2010    great toe  . Pacemaker placement 07/2005    Medtronic    Review of Systems Denies neck pain, dizziness, double vision, slurred speech or motor deficits. Hi face has been symmetric No chest pain or palpitations Mo HAs     Objective:   Physical Exam  Constitutional: He appears well-developed.  Neck:        Normal carotid pulses  Cardiovascular: Normal heart sounds.   No murmur heard.      Irregular rate  Pulmonary/Chest: Effort normal and breath sounds normal. No respiratory distress. He has no wheezes. He has no rales.  Neurological:       Alert, oriented x3, speech is fluent, face  symmetric. The external ocular movements intact. Pupils are equal and reactive Motor strength without deficits DTRs symmetric, but decreased throughout except in the ankles.           Assessment & Plan:

## 2010-12-16 NOTE — Assessment & Plan Note (Signed)
Right upper arm paresthesia, denies any neck pain, no motor deficits. Etiology unclear, referr to neurology

## 2010-12-16 NOTE — Telephone Encounter (Signed)
C/o tingle in right arm x 2 day. Would like to be seen today.

## 2010-12-16 NOTE — Telephone Encounter (Signed)
Pt complaining of right arm tingling - like it is asleep - most all weekend.  He denies any chest pain/discomfort or SOB.  No other s/s.  Requested pt call his PCP for evaluation.  He is in agreement.

## 2010-12-20 ENCOUNTER — Encounter (INDEPENDENT_AMBULATORY_CARE_PROVIDER_SITE_OTHER): Payer: Medicare Other | Admitting: *Deleted

## 2010-12-20 ENCOUNTER — Ambulatory Visit (HOSPITAL_COMMUNITY): Payer: Medicare Other | Attending: Cardiology | Admitting: Radiology

## 2010-12-20 DIAGNOSIS — I359 Nonrheumatic aortic valve disorder, unspecified: Secondary | ICD-10-CM

## 2010-12-20 DIAGNOSIS — I6529 Occlusion and stenosis of unspecified carotid artery: Secondary | ICD-10-CM

## 2010-12-20 DIAGNOSIS — I080 Rheumatic disorders of both mitral and aortic valves: Secondary | ICD-10-CM | POA: Insufficient documentation

## 2010-12-20 DIAGNOSIS — Z954 Presence of other heart-valve replacement: Secondary | ICD-10-CM | POA: Insufficient documentation

## 2010-12-20 DIAGNOSIS — I5032 Chronic diastolic (congestive) heart failure: Secondary | ICD-10-CM

## 2010-12-20 DIAGNOSIS — I079 Rheumatic tricuspid valve disease, unspecified: Secondary | ICD-10-CM | POA: Insufficient documentation

## 2010-12-27 ENCOUNTER — Encounter: Payer: Medicare Other | Admitting: *Deleted

## 2010-12-31 ENCOUNTER — Encounter: Payer: Self-pay | Admitting: *Deleted

## 2010-12-31 ENCOUNTER — Ambulatory Visit (INDEPENDENT_AMBULATORY_CARE_PROVIDER_SITE_OTHER): Payer: Medicare Other | Admitting: Cardiology

## 2010-12-31 ENCOUNTER — Encounter: Payer: Self-pay | Admitting: Cardiology

## 2010-12-31 ENCOUNTER — Ambulatory Visit (INDEPENDENT_AMBULATORY_CARE_PROVIDER_SITE_OTHER): Payer: Medicare Other | Admitting: *Deleted

## 2010-12-31 DIAGNOSIS — I350 Nonrheumatic aortic (valve) stenosis: Secondary | ICD-10-CM

## 2010-12-31 DIAGNOSIS — I25810 Atherosclerosis of coronary artery bypass graft(s) without angina pectoris: Secondary | ICD-10-CM

## 2010-12-31 DIAGNOSIS — E785 Hyperlipidemia, unspecified: Secondary | ICD-10-CM

## 2010-12-31 DIAGNOSIS — I4891 Unspecified atrial fibrillation: Secondary | ICD-10-CM

## 2010-12-31 DIAGNOSIS — I6529 Occlusion and stenosis of unspecified carotid artery: Secondary | ICD-10-CM

## 2010-12-31 DIAGNOSIS — I2510 Atherosclerotic heart disease of native coronary artery without angina pectoris: Secondary | ICD-10-CM

## 2010-12-31 DIAGNOSIS — I509 Heart failure, unspecified: Secondary | ICD-10-CM

## 2010-12-31 DIAGNOSIS — Z954 Presence of other heart-valve replacement: Secondary | ICD-10-CM

## 2010-12-31 DIAGNOSIS — R0602 Shortness of breath: Secondary | ICD-10-CM

## 2010-12-31 DIAGNOSIS — I7389 Other specified peripheral vascular diseases: Secondary | ICD-10-CM

## 2010-12-31 DIAGNOSIS — I359 Nonrheumatic aortic valve disorder, unspecified: Secondary | ICD-10-CM

## 2010-12-31 LAB — BASIC METABOLIC PANEL
CO2: 32 mEq/L (ref 19–32)
Chloride: 102 mEq/L (ref 96–112)
Potassium: 3.9 mEq/L (ref 3.5–5.1)
Sodium: 140 mEq/L (ref 135–145)

## 2010-12-31 LAB — CBC WITH DIFFERENTIAL/PLATELET
Basophils Absolute: 0 10*3/uL (ref 0.0–0.1)
Hemoglobin: 15 g/dL (ref 13.0–17.0)
Lymphocytes Relative: 21.6 % (ref 12.0–46.0)
Monocytes Relative: 8.2 % (ref 3.0–12.0)
Neutrophils Relative %: 67.5 % (ref 43.0–77.0)
Platelets: 204 10*3/uL (ref 150.0–400.0)
RDW: 16.8 % — ABNORMAL HIGH (ref 11.5–14.6)

## 2010-12-31 LAB — POCT INR: INR: 1.2

## 2010-12-31 LAB — HEPATIC FUNCTION PANEL
ALT: 20 U/L (ref 0–53)
AST: 30 U/L (ref 0–37)
Total Bilirubin: 0.5 mg/dL (ref 0.3–1.2)
Total Protein: 8.3 g/dL (ref 6.0–8.3)

## 2010-12-31 LAB — LIPID PANEL
Cholesterol: 145 mg/dL (ref 0–200)
HDL: 31.9 mg/dL — ABNORMAL LOW (ref >39.00)

## 2010-12-31 NOTE — Assessment & Plan Note (Signed)
Follows closely with Dr. Myra Gianotti.

## 2010-12-31 NOTE — Assessment & Plan Note (Signed)
Bioprosthetic aortic valve with mean gradient 46 mmHg.  This suggests severe bioprosthetic valve AS.  There is also severe pulmonary hypertension.  He appears to have had rapid degeneration of the valve.  He has significant heart failure symptoms that are controlled by a high dose of Lasix.  I am going to look closer at his valve with a TEE.  I am also going to set him up for a right and left heart catheterization to assess filling pressures and grafts as I think he may need a redo aortic valve replacement.  He will need to hold coumadin prior to this.

## 2010-12-31 NOTE — Patient Instructions (Signed)
Lab today--BMP/BNP/CBC/Lipid profile/Liver profile  414.05  428.32  424.1  You are scheduled for a TEE and Right and Left Heart cath 01/08/11--see instruction sheet given to you today.  Follow the instructions from the coumadin clinic about holding your coumadin before the TEE and cath on 01/08/11.  Schedule an appointment to see Dr Shirlee Latch in  3-4 weeks.

## 2010-12-31 NOTE — Assessment & Plan Note (Signed)
Stable moderate disease, repeat in 1 year.

## 2010-12-31 NOTE — Assessment & Plan Note (Signed)
Chronic diastolic CHF.  Mild volume overload.  He is on a high dose of Lasix.  Will continue Lasix at current dose for now.  RHC as above.  Check BMET/BNP.

## 2010-12-31 NOTE — Assessment & Plan Note (Signed)
No chest pain.  As above, plan LHC/RHC prior to possible redo AVR.

## 2010-12-31 NOTE — Assessment & Plan Note (Signed)
Check lipids/LFTs with goal LDL < 70.  

## 2010-12-31 NOTE — Progress Notes (Signed)
PCP: Dr. Drue Novel  76 yo with history of AS s/p AVR, diastolic CHF, CAD, chronic atrial fibrillation, s/p pacemaker presents for cardiology followup.   He continutes to have stable dyspnea after walking about 1 block on flat ground.  He is short of breath with steps.  No orthopnea, PND, or chest pain.  He has had these symptoms chronically.  He takes 160 mg Lasix in the morning and has been doing this long-term. Weight is down 9 lbs since prior appointment.   He is in chronic atrial fibrillation.  He has severe PAD and had a toe amputation in 8/11.  He sees Dr. Myra Gianotti for this.  He actually does not get pain in his calves with ambulation.  Hip and knee pain has improved with weight loss.  He does get paresthesias in his feet bilaterally.  Echo was done in 6/12 to reassess his bioprosthetic aortic valve, which was moderately stenosed when assessed last summer.  The most recent echo shows that the mean gradient across the aortic valve has increased to 46 mmHg with PA systolic pressure 74 mmHg.    ECG: atrial fibrillation with  V-pacing  Labs (1/12): LDL 71, HDL 29, K 4.7, creatinine 1.1 Labs (4/12): K 4, creatinine 1.0, BNP 262  PMH:  1. Aortic stenosis: s/p bioprosthetic aortic valve replacement in 06/2004.  Echo in 7/11 showed mean gradient 35 mmHg across the bioprosthetic valve.  Echo in 6/12 showed worsening of the bioprosthetic mean gradient to 46 mmHg.   2. CAD: CABG with AVR in 1/06.  Patient had SVG-CFX and SVG-D.   3. Diabetes 4. Hyperlipidemia 5. HTN 6. Sick sinus syndrome with Medtronic dual chamber PCM 7. Obesity 8. OSA: Not using CPAP 9. Chronic diastolic CHF: Echo in 7/11 showed EF 60-65% with moderate LV hypertrophy, moderate diastolic dysfunction, mild mitral stenosis, bioprosthetic aortic valve with mean gradient 35 mmHg (moderate stenosis) and PA systolic pressure 69 mmHg.   Echo (6/12) showed EF 60-65%, moderate LV hypertrophy, bioprosthetic aortic valve with mean gradient 46 mmHg  (severe stenosis) and PA systolic pressure 74 mmHg, mild MR.  10. Nephrolithiasis 11. PAD: Has seen [facility].  Lower extremity angiography in 9/10 showed occlusion of all 3 vessels below the knees in both legs.  Patient then apparently had femoral artery PCI by Dr. Myra Gianotti in 2011.  He had amputation of the left great toe in 8/11.  12. Atrial fibrillation: Chronic, on coumadin. 13. Carotid stenosis: carotid dopplers (7/11) with 40-59% bilateral ICA stenosis.  Carotids (6/12) with 40-59% bilateral ICA stenosis.   SH: Married, lives in [place].  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   FH: Father with MI at 52, mother with CVA at 68  ROS: All systems reviewed and negative except as per HPI.   Current Outpatient Prescriptions  Medication Sig Dispense Refill  . aspirin EC 81 MG EC tablet Take 1 tablet (81 mg total) by mouth daily.    0  . ezetimibe-simvastatin (VYTORIN) 10-40 MG per tablet Take 1 tablet by mouth at bedtime.        . furosemide (LASIX) 80 MG tablet 2 tabs in the am.       . insulin glargine (LANTUS) 100 UNIT/ML injection Inject into the skin at bedtime. Takes anywhere from 50 to 85 per pt      . insulin glulisine (APIDRA) 100 UNIT/ML injection Takes anywhere from 8 to 14 with  some meals      . lisinopril-hydrochlorothiazide (PRINZIDE,ZESTORETIC) 20-12.5 MG per tablet Take 1 tablet by mouth daily.        . metFORMIN (GLUCOPHAGE-XR) 500 MG 24 hr tablet Take 1 tab in the morning and 1 tablet in the evening.       Marland Kitchen spironolactone (ALDACTONE) 25 MG tablet Take 25 mg by mouth daily.        Marland Kitchen warfarin (COUMADIN) 5 MG tablet Take 1 tablet (5 mg total) by mouth as directed.  105 tablet  1  . DISCONTD: multivitamin (THERAGRAN) per tablet Take 1 tablet by mouth daily.          BP 110/64  Pulse 68  Resp 18  Ht 5\' 8"  (1.727 m)  Wt 239 lb 6.4 oz (108.591 kg)  BMI 36.40 kg/m2 General: obese,  NAD Neck: JVP 8 cm, no thyromegaly or thyroid nodule.  Lungs: Clear to auscultation bilaterally with normal respiratory effort. CV: Nondisplaced PMI.  Heart irregular S1/S2, no S3/S4, 3/6 mid-peaking systolic crescendo-decrescendo murmur RUSB.  S2 is heard clearly.  1+ ankle edema.  Unable to palpate pedal pulses.  Bilateral carotid bruits. Abdomen: Soft, nontender, no hepatosplenomegaly, no distention.  Neurologic: Alert and oriented x 3.  Psych: Normal affect. Extremities: No clubbing or cyanosis.

## 2010-12-31 NOTE — Assessment & Plan Note (Signed)
Chronic.  No history of CVA.  Hold coumadin prior to cath.

## 2011-01-02 ENCOUNTER — Telehealth: Payer: Self-pay | Admitting: Internal Medicine

## 2011-01-02 NOTE — Telephone Encounter (Signed)
[name] spoke to patient

## 2011-01-02 NOTE — Telephone Encounter (Signed)
Per pt wife returning call back to nurse.

## 2011-01-06 ENCOUNTER — Encounter: Payer: Self-pay | Admitting: Internal Medicine

## 2011-01-06 ENCOUNTER — Telehealth: Payer: Self-pay | Admitting: Cardiology

## 2011-01-06 ENCOUNTER — Encounter: Payer: Self-pay | Admitting: *Deleted

## 2011-01-06 DIAGNOSIS — I495 Sick sinus syndrome: Secondary | ICD-10-CM

## 2011-01-06 NOTE — Telephone Encounter (Signed)
Pt has heart cath 7-18 wants to know if he was to do any blood work?

## 2011-01-06 NOTE — Telephone Encounter (Signed)
Pt had CBC/BMP/PT 12/31/10 reviewed by Dr Shirlee Latch. No further lab necessary. Pt aware.

## 2011-01-08 ENCOUNTER — Encounter: Payer: Medicare Other | Admitting: *Deleted

## 2011-01-08 ENCOUNTER — Other Ambulatory Visit: Payer: Self-pay | Admitting: Neurology

## 2011-01-08 ENCOUNTER — Inpatient Hospital Stay (HOSPITAL_BASED_OUTPATIENT_CLINIC_OR_DEPARTMENT_OTHER)
Admission: RE | Admit: 2011-01-08 | Discharge: 2011-01-08 | Disposition: A | Discharge status: Hospice | Payer: Medicare Other | Source: Ambulatory Visit | Attending: Cardiology | Admitting: Cardiology

## 2011-01-08 ENCOUNTER — Telehealth: Payer: Self-pay | Admitting: Pharmacist

## 2011-01-08 ENCOUNTER — Ambulatory Visit (HOSPITAL_COMMUNITY): Admission: RE | Admit: 2011-01-08 | Payer: Medicare Other | Source: Ambulatory Visit | Admitting: Cardiology

## 2011-01-08 ENCOUNTER — Inpatient Hospital Stay (HOSPITAL_COMMUNITY)
Admission: AD | Admit: 2011-01-08 | Discharge: 2011-01-24 | DRG: 216 | Disposition: A | Discharge status: Skilled Nursing Facility | Payer: Medicare Other | Source: Ambulatory Visit | Attending: Surgery | Admitting: Surgery

## 2011-01-08 ENCOUNTER — Inpatient Hospital Stay (HOSPITAL_COMMUNITY): Payer: Medicare Other

## 2011-01-08 DIAGNOSIS — I2510 Atherosclerotic heart disease of native coronary artery without angina pectoris: Secondary | ICD-10-CM | POA: Insufficient documentation

## 2011-01-08 DIAGNOSIS — S98119A Complete traumatic amputation of unspecified great toe, initial encounter: Secondary | ICD-10-CM

## 2011-01-08 DIAGNOSIS — I5189 Other ill-defined heart diseases: Secondary | ICD-10-CM | POA: Diagnosis present

## 2011-01-08 DIAGNOSIS — I509 Heart failure, unspecified: Secondary | ICD-10-CM | POA: Insufficient documentation

## 2011-01-08 DIAGNOSIS — I2582 Chronic total occlusion of coronary artery: Secondary | ICD-10-CM | POA: Insufficient documentation

## 2011-01-08 DIAGNOSIS — Z951 Presence of aortocoronary bypass graft: Secondary | ICD-10-CM | POA: Insufficient documentation

## 2011-01-08 DIAGNOSIS — I5033 Acute on chronic diastolic (congestive) heart failure: Secondary | ICD-10-CM | POA: Diagnosis present

## 2011-01-08 DIAGNOSIS — I10 Essential (primary) hypertension: Secondary | ICD-10-CM | POA: Diagnosis present

## 2011-01-08 DIAGNOSIS — E119 Type 2 diabetes mellitus without complications: Secondary | ICD-10-CM | POA: Diagnosis present

## 2011-01-08 DIAGNOSIS — Y849 Medical procedure, unspecified as the cause of abnormal reaction of the patient, or of later complication, without mention of misadventure at the time of the procedure: Secondary | ICD-10-CM | POA: Diagnosis present

## 2011-01-08 DIAGNOSIS — D62 Acute posthemorrhagic anemia: Secondary | ICD-10-CM | POA: Diagnosis not present

## 2011-01-08 DIAGNOSIS — I359 Nonrheumatic aortic valve disorder, unspecified: Secondary | ICD-10-CM | POA: Diagnosis present

## 2011-01-08 DIAGNOSIS — T82897A Other specified complication of cardiac prosthetic devices, implants and grafts, initial encounter: Principal | ICD-10-CM | POA: Diagnosis present

## 2011-01-08 DIAGNOSIS — I4891 Unspecified atrial fibrillation: Secondary | ICD-10-CM | POA: Insufficient documentation

## 2011-01-08 DIAGNOSIS — Z7901 Long term (current) use of anticoagulants: Secondary | ICD-10-CM

## 2011-01-08 DIAGNOSIS — I679 Cerebrovascular disease, unspecified: Secondary | ICD-10-CM

## 2011-01-08 DIAGNOSIS — E785 Hyperlipidemia, unspecified: Secondary | ICD-10-CM | POA: Diagnosis present

## 2011-01-08 DIAGNOSIS — I5030 Unspecified diastolic (congestive) heart failure: Secondary | ICD-10-CM | POA: Insufficient documentation

## 2011-01-08 DIAGNOSIS — I2789 Other specified pulmonary heart diseases: Secondary | ICD-10-CM | POA: Diagnosis present

## 2011-01-08 DIAGNOSIS — R209 Unspecified disturbances of skin sensation: Secondary | ICD-10-CM

## 2011-01-08 DIAGNOSIS — I059 Rheumatic mitral valve disease, unspecified: Secondary | ICD-10-CM | POA: Diagnosis present

## 2011-01-08 DIAGNOSIS — Z952 Presence of prosthetic heart valve: Secondary | ICD-10-CM | POA: Insufficient documentation

## 2011-01-08 LAB — GLUCOSE, CAPILLARY
Glucose-Capillary: 126 mg/dL — ABNORMAL HIGH (ref 70–99)
Glucose-Capillary: 162 mg/dL — ABNORMAL HIGH (ref 70–99)
Glucose-Capillary: 196 mg/dL — ABNORMAL HIGH (ref 70–99)

## 2011-01-08 LAB — PROTIME-INR
INR: 1.12 (ref 0.00–1.49)
Prothrombin Time: 14.6 seconds (ref 11.6–15.2)

## 2011-01-08 MED ORDER — ENOXAPARIN SODIUM 100 MG/ML ~~LOC~~ SOLN: 100.0000 mg | Freq: Two times a day (BID) | SUBCUTANEOUS | Status: DC

## 2011-01-08 NOTE — Telephone Encounter (Signed)
Dr. Shirlee Latch called from [facility].  Pt had TEE today as well as cath.  Dr. Shirlee Latch identified area that may be beginning of clot and would like to bridge patient with Lovenox.  He states it is okay to start Lovenox at approximately 8pm tonight.   Called [facility] and spoke with [facility].  He will instruct pt and daughter on following instructions:  7/18- Lovenox 100mg  at 8pm and Coumadin 10mg   7/19- Lovenox 100mg  in AM and PM and Coumadin 10 mg 7/20- Lovenox 100mg  in AM and PM and Coumadin 5mg   7/21- Lovenox 100mg  in AM and PM and Coumadin 5mg   7/22- Lovenox 100mg  in AM and PM and Coumadin 5mg  7/23- Pt [facility] appt with [facility].    Rx sent to [facility] on [address].  Verified they have enough syringes in stock to fill order.

## 2011-01-09 DIAGNOSIS — I4891 Unspecified atrial fibrillation: Secondary | ICD-10-CM

## 2011-01-09 LAB — COMPREHENSIVE METABOLIC PANEL
ALT: 24 U/L (ref 0–53)
CO2: 31 mEq/L (ref 19–32)
Calcium: 10.3 mg/dL (ref 8.4–10.5)
Chloride: 94 mEq/L — ABNORMAL LOW (ref 96–112)
GFR calc Af Amer: >60 mL/min (ref >60)
GFR calc non Af Amer: >60 mL/min (ref >60)
Glucose, Bld: 155 mg/dL — ABNORMAL HIGH (ref 70–99)
Sodium: 134 mEq/L — ABNORMAL LOW (ref 135–145)
Total Bilirubin: 0.3 mg/dL (ref 0.3–1.2)

## 2011-01-09 LAB — CBC
HCT: 40.6 % (ref 39.0–52.0)
MCH: 29.6 pg (ref 26.0–34.0)
MCV: 84.6 fL (ref 78.0–100.0)
Platelets: 174 10*3/uL (ref 150–400)
RBC: 4.8 MIL/uL (ref 4.22–5.81)
RDW: 15 % (ref 11.5–15.5)

## 2011-01-09 LAB — GLUCOSE, CAPILLARY
Glucose-Capillary: 98 mg/dL (ref 70–99)
Glucose-Capillary: 132 mg/dL — ABNORMAL HIGH (ref 70–99)

## 2011-01-09 LAB — TSH: TSH: 1.038 u[IU]/mL (ref 0.350–4.500)

## 2011-01-09 LAB — HEPARIN LEVEL (UNFRACTIONATED): Heparin Unfractionated: 0.24 IU/mL — ABNORMAL LOW (ref 0.30–0.70)

## 2011-01-09 NOTE — Cardiovascular Report (Signed)
NAMESANTIAGO, [name]                ACCOUNT NO.:  [id number]  MEDICAL RECORD NO.:  [id number]  LOCATION:  2033                         FACILITY:  [facility]  PHYSICIAN:  Marca Ancona, MD      DATE OF BIRTH:  02/20/1935  DATE OF PROCEDURE:  01/08/2011 DATE OF DISCHARGE:                           CARDIAC CATHETERIZATION   PROCEDURES: 1. Coronary angiography. 2. Saphenous vein graft angiography.  OPERATOR:  Marca Ancona, MD  INDICATIONS:  This is a 76 year old with coronary artery disease status post bypass surgery, chronic atrial fibrillation with bioprosthetic aortic valve.  Recently, he has developed diastolic congestive heart failure that is quite significant requiring high dose of Lasix.  He was found on echo to have a significant gradient across this bioprosthetic aortic valve.  Most recent mean gradient being 46 mmHg.  We did a TEE earlier today showing that his mean gradient across his valve is up to 52 mmHg.  Doing a heart catheterization today to define his coronary angiography before any potential redo valve operation.  Initially, we had planned to do a left-to-right heart catheterization.  However, as he was found to have some amorphous material in his left atrial appendage on TEE worrisome for thrombus, we decided to only do a left heart catheterization using a 4-French system to unable [place] to begin anticoagulation more early.  PROCEDURE NOTE:  After informed consent was obtained, the right groin was sterilely prepped and draped.  Lidocaine 1% was used to locally anesthetize the right groin area.  The right common femoral artery was entered using modified Seldinger technique and a 4-French arterial sheath was placed.  The left coronary artery was engaged using the JL-4 catheter.  The right coronary artery was engaged using the [facility] catheter and the saphenous vein grafts were engaged using the JR-4 catheter.  COMPLICATIONS:  No known complications.  FINDINGS: 1. Right  coronary artery:  The right coronary artery was a dominant     vessel.  It was heavily calcified throughout the proximal portion.     It is possible that there may have been proximal and mid RCA stents     Though it is a bit difficult to tell.  There is 30% mid RCA     stenosis.  There is 50% distal RCA stenosis extending into the     ostial PLV and the ostial PDA, both of which were having about 50%     stenosis.  There is an 80% stenosis in the mid PDA. 2. Left main:  Left main has luminal irregularities. 3. Left circumflex system:  There is a small-to-moderate ramus with     luminal irregularities.  The circumflex itself has a 40% ostial     stenosis.  There was a small first obtuse marginal after this.  The     mid circumflex is totally occluded and heavily calcified.  There is     a patent saphenous vein graft to the second obtuse marginal below     the occlusion.  This backfills the AV circumflex.  There is     about 70% stenosis in OM-2 proximal to the touchdown of the vein     graft. 4.  LAD system.  There is a 50% LAD stenosis after the first diagonal.     There is calcification in the proximal LAD.  There is 90% ostial     first diagonal stenosis.  There is a patent saphenous vein graft to     the first diagonal that fills back into the LAD.  IMPRESSION:  The patient has patent bypass grafts.  He has a 50% proximal left anterior descending lesion.  There is moderate distal right coronary artery, posterior left ventricular and posterior descending artery disease.  There is no definite target for revascularization.  The patient will begin on therapeutic Lovenox 6 hours after sheath is removed given the amorphous material noted in the left atrial appendage on TEE.  He will also been restarted on Coumadin tonight.     Marca Ancona, MD     DM/MEDQ  D:  01/08/2011  T:  01/09/2011  Job:  [id number]  cc:   Willow Ora, MD  Electronically Signed by Marca Ancona MD on 01/09/2011  01:47:41 PM

## 2011-01-10 LAB — CBC
Hemoglobin: 13.8 g/dL (ref 13.0–17.0)
Platelets: 164 10*3/uL (ref 150–400)
RBC: 4.8 MIL/uL (ref 4.22–5.81)
WBC: 8.9 10*3/uL (ref 4.0–10.5)

## 2011-01-10 LAB — BASIC METABOLIC PANEL
CO2: 31 mEq/L (ref 19–32)
Calcium: 9.7 mg/dL (ref 8.4–10.5)
Glucose, Bld: 109 mg/dL — ABNORMAL HIGH (ref 70–99)
Potassium: 3.5 mEq/L (ref 3.5–5.1)
Sodium: 136 mEq/L (ref 135–145)

## 2011-01-10 LAB — GLUCOSE, CAPILLARY
Glucose-Capillary: 93 mg/dL (ref 70–99)
Glucose-Capillary: 121 mg/dL — ABNORMAL HIGH (ref 70–99)
Glucose-Capillary: 211 mg/dL — ABNORMAL HIGH (ref 70–99)
Glucose-Capillary: 320 mg/dL — ABNORMAL HIGH (ref 70–99)
Glucose-Capillary: 75 mg/dL (ref 70–99)

## 2011-01-10 LAB — HEPARIN LEVEL (UNFRACTIONATED): Heparin Unfractionated: 0.34 IU/mL (ref 0.30–0.70)

## 2011-01-11 LAB — BASIC METABOLIC PANEL
BUN: 19 mg/dL (ref 6–23)
CO2: 30 mEq/L (ref 19–32)
Calcium: 10.4 mg/dL (ref 8.4–10.5)
GFR calc non Af Amer: >60 mL/min (ref >60)
Glucose, Bld: 128 mg/dL — ABNORMAL HIGH (ref 70–99)
Potassium: 3.8 mEq/L (ref 3.5–5.1)
Sodium: 136 mEq/L (ref 135–145)

## 2011-01-11 LAB — GLUCOSE, CAPILLARY
Glucose-Capillary: 119 mg/dL — ABNORMAL HIGH (ref 70–99)
Glucose-Capillary: 160 mg/dL — ABNORMAL HIGH (ref 70–99)
Glucose-Capillary: 100 mg/dL — ABNORMAL HIGH (ref 70–99)

## 2011-01-11 LAB — CBC
HCT: 41.3 % (ref 39.0–52.0)
MCH: 28.9 pg (ref 26.0–34.0)
MCV: 84.6 fL (ref 78.0–100.0)
Platelets: 158 10*3/uL (ref 150–400)
RDW: 15.1 % (ref 11.5–15.5)

## 2011-01-12 LAB — GLUCOSE, CAPILLARY

## 2011-01-12 LAB — CBC
MCH: 29.4 pg (ref 26.0–34.0)
MCV: 84.5 fL (ref 78.0–100.0)
Platelets: 150 10*3/uL (ref 150–400)
RBC: 4.9 MIL/uL (ref 4.22–5.81)
RDW: 15.1 % (ref 11.5–15.5)

## 2011-01-12 LAB — BASIC METABOLIC PANEL
BUN: 18 mg/dL (ref 6–23)
Calcium: 10.2 mg/dL (ref 8.4–10.5)
Creatinine, Ser: 1.07 mg/dL (ref 0.50–1.35)
GFR calc Af Amer: >60 mL/min (ref >60)
GFR calc non Af Amer: >60 mL/min (ref >60)

## 2011-01-12 LAB — HEPARIN LEVEL (UNFRACTIONATED): Heparin Unfractionated: 0.3 IU/mL (ref 0.30–0.70)

## 2011-01-12 LAB — PRO B NATRIURETIC PEPTIDE: Pro B Natriuretic peptide (BNP): 641.7 pg/mL — ABNORMAL HIGH (ref 0–125)

## 2011-01-13 ENCOUNTER — Encounter: Payer: Medicare Other | Admitting: *Deleted

## 2011-01-13 DIAGNOSIS — I359 Nonrheumatic aortic valve disorder, unspecified: Secondary | ICD-10-CM

## 2011-01-13 LAB — CBC
HCT: 40.4 % (ref 39.0–52.0)
Hemoglobin: 13.8 g/dL (ref 13.0–17.0)
MCHC: 34.2 g/dL (ref 30.0–36.0)
RBC: 4.74 MIL/uL (ref 4.22–5.81)
WBC: 9.3 10*3/uL (ref 4.0–10.5)

## 2011-01-13 LAB — GLUCOSE, CAPILLARY
Glucose-Capillary: 137 mg/dL — ABNORMAL HIGH (ref 70–99)
Glucose-Capillary: 86 mg/dL (ref 70–99)
Glucose-Capillary: 117 mg/dL — ABNORMAL HIGH (ref 70–99)

## 2011-01-13 LAB — BASIC METABOLIC PANEL
Calcium: 10.1 mg/dL (ref 8.4–10.5)
GFR calc Af Amer: >60 mL/min (ref >60)
GFR calc non Af Amer: >60 mL/min (ref >60)
Glucose, Bld: 166 mg/dL — ABNORMAL HIGH (ref 70–99)
Sodium: 137 mEq/L (ref 135–145)

## 2011-01-13 LAB — PROTIME-INR: Prothrombin Time: 20.8 seconds — ABNORMAL HIGH (ref 11.6–15.2)

## 2011-01-13 LAB — HEPARIN LEVEL (UNFRACTIONATED): Heparin Unfractionated: 0.37 IU/mL (ref 0.30–0.70)

## 2011-01-14 ENCOUNTER — Encounter: Payer: Medicare Other | Admitting: Surgery

## 2011-01-14 ENCOUNTER — Encounter: Payer: Medicare Other | Admitting: *Deleted

## 2011-01-14 DIAGNOSIS — I359 Nonrheumatic aortic valve disorder, unspecified: Secondary | ICD-10-CM

## 2011-01-14 LAB — CBC
MCV: 85.2 fL (ref 78.0–100.0)
Platelets: 170 10*3/uL (ref 150–400)
RBC: 4.81 MIL/uL (ref 4.22–5.81)
RDW: 15.3 % (ref 11.5–15.5)
WBC: 9.9 10*3/uL (ref 4.0–10.5)

## 2011-01-14 LAB — URINALYSIS, ROUTINE W REFLEX MICROSCOPIC
Bilirubin Urine: NEGATIVE
Hgb urine dipstick: NEGATIVE
Ketones, ur: NEGATIVE mg/dL
Specific Gravity, Urine: 1.011 (ref 1.005–1.030)
Urobilinogen, UA: 1 mg/dL (ref 0.0–1.0)
pH: 7 (ref 5.0–8.0)

## 2011-01-14 LAB — PROTIME-INR: INR: 1.74 — ABNORMAL HIGH (ref 0.00–1.49)

## 2011-01-14 LAB — URINE MICROSCOPIC-ADD ON

## 2011-01-14 LAB — GLUCOSE, CAPILLARY
Glucose-Capillary: 141 mg/dL — ABNORMAL HIGH (ref 70–99)
Glucose-Capillary: 194 mg/dL — ABNORMAL HIGH (ref 70–99)

## 2011-01-14 LAB — HEPARIN LEVEL (UNFRACTIONATED): Heparin Unfractionated: 0.44 IU/mL (ref 0.30–0.70)

## 2011-01-15 DIAGNOSIS — Z01810 Encounter for preprocedural cardiovascular examination: Secondary | ICD-10-CM

## 2011-01-15 DIAGNOSIS — I359 Nonrheumatic aortic valve disorder, unspecified: Secondary | ICD-10-CM

## 2011-01-15 LAB — BASIC METABOLIC PANEL
CO2: 30 mEq/L (ref 19–32)
Chloride: 97 mEq/L (ref 96–112)
GFR calc Af Amer: >60 mL/min (ref >60)
Potassium: 3.6 mEq/L (ref 3.5–5.1)
Sodium: 136 mEq/L (ref 135–145)

## 2011-01-15 LAB — CBC
HCT: 41.9 % (ref 39.0–52.0)
Hemoglobin: 14.4 g/dL (ref 13.0–17.0)
RBC: 4.91 MIL/uL (ref 4.22–5.81)
RDW: 15.4 % (ref 11.5–15.5)
WBC: 9 10*3/uL (ref 4.0–10.5)

## 2011-01-15 LAB — PROTIME-INR: INR: 1.54 — ABNORMAL HIGH (ref 0.00–1.49)

## 2011-01-15 LAB — GLUCOSE, CAPILLARY
Glucose-Capillary: 92 mg/dL (ref 70–99)
Glucose-Capillary: 121 mg/dL — ABNORMAL HIGH (ref 70–99)

## 2011-01-15 LAB — HEPARIN LEVEL (UNFRACTIONATED): Heparin Unfractionated: 0.39 IU/mL (ref 0.30–0.70)

## 2011-01-16 ENCOUNTER — Inpatient Hospital Stay (HOSPITAL_COMMUNITY): Payer: Medicare Other

## 2011-01-16 LAB — ABO/RH: ABO/RH(D): B POS

## 2011-01-16 LAB — CBC
Hemoglobin: 14.3 g/dL (ref 13.0–17.0)
MCH: 28.7 pg (ref 26.0–34.0)
RBC: 4.98 MIL/uL (ref 4.22–5.81)

## 2011-01-16 LAB — GLUCOSE, CAPILLARY
Glucose-Capillary: 158 mg/dL — ABNORMAL HIGH (ref 70–99)
Glucose-Capillary: 55 mg/dL — ABNORMAL LOW (ref 70–99)

## 2011-01-16 LAB — COMPREHENSIVE METABOLIC PANEL
ALT: 26 U/L (ref 0–53)
CO2: 29 mEq/L (ref 19–32)
Calcium: 10.3 mg/dL (ref 8.4–10.5)
Creatinine, Ser: 0.98 mg/dL (ref 0.50–1.35)
GFR calc Af Amer: >60 mL/min (ref >60)
GFR calc non Af Amer: >60 mL/min (ref >60)
Glucose, Bld: 62 mg/dL — ABNORMAL LOW (ref 70–99)

## 2011-01-16 LAB — BLOOD GAS, ARTERIAL
Bicarbonate: 29 mEq/L — ABNORMAL HIGH (ref 20.0–24.0)
FIO2: 0.21 %
O2 Saturation: 92.9 %
TCO2: 30.4 mmol/L (ref 0–100)
pO2, Arterial: 67.4 mmHg — ABNORMAL LOW (ref 80.0–100.0)

## 2011-01-16 LAB — PROTIME-INR: Prothrombin Time: 16.2 seconds — ABNORMAL HIGH (ref 11.6–15.2)

## 2011-01-16 LAB — HEPARIN LEVEL (UNFRACTIONATED): Heparin Unfractionated: 0.29 IU/mL — ABNORMAL LOW (ref 0.30–0.70)

## 2011-01-17 ENCOUNTER — Encounter: Payer: Self-pay | Admitting: Cardiology

## 2011-01-17 ENCOUNTER — Inpatient Hospital Stay (HOSPITAL_COMMUNITY): Payer: Medicare Other

## 2011-01-17 ENCOUNTER — Other Ambulatory Visit: Payer: Self-pay | Admitting: Surgery

## 2011-01-17 DIAGNOSIS — I359 Nonrheumatic aortic valve disorder, unspecified: Secondary | ICD-10-CM

## 2011-01-17 HISTORY — PX: AORTIC VALVE REPLACEMENT: SHX41

## 2011-01-17 LAB — POCT I-STAT 3, ART BLOOD GAS (G3+)
Acid-Base Excess: 4 mmol/L — ABNORMAL HIGH (ref 0.0–2.0)
Acid-Base Excess: 1 mmol/L (ref 0.0–2.0)
Acid-base deficit: 1 mmol/L (ref 0.0–2.0)
Bicarbonate: 30.1 mEq/L — ABNORMAL HIGH (ref 20.0–24.0)
Bicarbonate: 27.8 mEq/L — ABNORMAL HIGH (ref 20.0–24.0)
Bicarbonate: 25.2 mEq/L — ABNORMAL HIGH (ref 20.0–24.0)
O2 Saturation: 100 %
O2 Saturation: 98 %
TCO2: 29 mmol/L (ref 0–100)
TCO2: 27 mmol/L (ref 0–100)
TCO2: 27 mmol/L (ref 0–100)
pCO2 arterial: 39.2 mmHg (ref 35.0–45.0)
pCO2 arterial: 42.1 mmHg (ref 35.0–45.0)
pCO2 arterial: 48.6 mmHg — ABNORMAL HIGH (ref 35.0–45.0)
pH, Arterial: 7.387 (ref 7.350–7.450)
pH, Arterial: 7.458 — ABNORMAL HIGH (ref 7.350–7.450)
pH, Arterial: 7.319 — ABNORMAL LOW (ref 7.350–7.450)
pO2, Arterial: 524 mmHg — ABNORMAL HIGH (ref 80.0–100.0)
pO2, Arterial: 104 mmHg — ABNORMAL HIGH (ref 80.0–100.0)

## 2011-01-17 LAB — POCT I-STAT 4, (NA,K, GLUC, HGB,HCT)
Glucose, Bld: 137 mg/dL — ABNORMAL HIGH (ref 70–99)
Glucose, Bld: 131 mg/dL — ABNORMAL HIGH (ref 70–99)
Glucose, Bld: 141 mg/dL — ABNORMAL HIGH (ref 70–99)
Glucose, Bld: 144 mg/dL — ABNORMAL HIGH (ref 70–99)
HCT: 39 % (ref 39.0–52.0)
HCT: 38 % — ABNORMAL LOW (ref 39.0–52.0)
HCT: 34 % — ABNORMAL LOW (ref 39.0–52.0)
HCT: 34 % — ABNORMAL LOW (ref 39.0–52.0)
Hemoglobin: 13.3 g/dL (ref 13.0–17.0)
Hemoglobin: 11.2 g/dL — ABNORMAL LOW (ref 13.0–17.0)
Hemoglobin: 11.6 g/dL — ABNORMAL LOW (ref 13.0–17.0)
Hemoglobin: 11.6 g/dL — ABNORMAL LOW (ref 13.0–17.0)
Potassium: 4.2 mEq/L (ref 3.5–5.1)
Potassium: 4 mEq/L (ref 3.5–5.1)
Potassium: 3.4 mEq/L — ABNORMAL LOW (ref 3.5–5.1)
Sodium: 134 mEq/L — ABNORMAL LOW (ref 135–145)
Sodium: 135 mEq/L (ref 135–145)
Sodium: 131 mEq/L — ABNORMAL LOW (ref 135–145)
Sodium: 131 mEq/L — ABNORMAL LOW (ref 135–145)
Sodium: 134 mEq/L — ABNORMAL LOW (ref 135–145)

## 2011-01-17 LAB — GLUCOSE, CAPILLARY
Glucose-Capillary: 100 mg/dL — ABNORMAL HIGH (ref 70–99)
Glucose-Capillary: 86 mg/dL (ref 70–99)
Glucose-Capillary: 97 mg/dL (ref 70–99)

## 2011-01-17 LAB — CBC
HCT: 42.8 % (ref 39.0–52.0)
HCT: 36.2 % — ABNORMAL LOW (ref 39.0–52.0)
MCH: 28.4 pg (ref 26.0–34.0)
MCH: 28.7 pg (ref 26.0–34.0)
MCHC: 34.6 g/dL (ref 30.0–36.0)
MCHC: 34.1 g/dL (ref 30.0–36.0)
MCHC: 34.3 g/dL (ref 30.0–36.0)
MCV: 85.4 fL (ref 78.0–100.0)
MCV: 83.3 fL (ref 78.0–100.0)
MCV: 83.8 fL (ref 78.0–100.0)
Platelets: 183 10*3/uL (ref 150–400)
Platelets: 92 10*3/uL — ABNORMAL LOW (ref 150–400)
RBC: 4.43 MIL/uL (ref 4.22–5.81)
RDW: 15.3 % (ref 11.5–15.5)
RDW: 15 % (ref 11.5–15.5)
RDW: 15.1 % (ref 11.5–15.5)
WBC: 12.1 10*3/uL — ABNORMAL HIGH (ref 4.0–10.5)

## 2011-01-17 LAB — BASIC METABOLIC PANEL
Calcium: 10.7 mg/dL — ABNORMAL HIGH (ref 8.4–10.5)
GFR calc Af Amer: >60 mL/min (ref >60)
GFR calc non Af Amer: 58 mL/min — ABNORMAL LOW (ref >60)
Potassium: 4.3 mEq/L (ref 3.5–5.1)
Sodium: 131 mEq/L — ABNORMAL LOW (ref 135–145)

## 2011-01-17 LAB — POCT I-STAT, CHEM 8
BUN: 19 mg/dL (ref 6–23)
Calcium, Ion: 1.23 mmol/L (ref 1.12–1.32)
Chloride: 104 mEq/L (ref 96–112)
Creatinine, Ser: 0.9 mg/dL (ref 0.50–1.35)
Glucose, Bld: 124 mg/dL — ABNORMAL HIGH (ref 70–99)

## 2011-01-17 LAB — HEPARIN LEVEL (UNFRACTIONATED): Heparin Unfractionated: 0.35 IU/mL (ref 0.30–0.70)

## 2011-01-17 LAB — HEMOGLOBIN AND HEMATOCRIT, BLOOD: Hemoglobin: 11.4 g/dL — ABNORMAL LOW (ref 13.0–17.0)

## 2011-01-17 LAB — SURGICAL PCR SCREEN: Staphylococcus aureus: NEGATIVE

## 2011-01-17 LAB — PROTIME-INR: Prothrombin Time: 15.2 seconds (ref 11.6–15.2)

## 2011-01-18 ENCOUNTER — Inpatient Hospital Stay (HOSPITAL_COMMUNITY): Payer: Medicare Other

## 2011-01-18 DIAGNOSIS — E119 Type 2 diabetes mellitus without complications: Secondary | ICD-10-CM

## 2011-01-18 LAB — CBC
HCT: 30.1 % — ABNORMAL LOW (ref 39.0–52.0)
Hemoglobin: 10 g/dL — ABNORMAL LOW (ref 13.0–17.0)
Platelets: 105 10*3/uL — ABNORMAL LOW (ref 150–400)
RBC: 3.9 MIL/uL — ABNORMAL LOW (ref 4.22–5.81)
RBC: 3.52 MIL/uL — ABNORMAL LOW (ref 4.22–5.81)
RDW: 15.5 % (ref 11.5–15.5)
WBC: 21.5 10*3/uL — ABNORMAL HIGH (ref 4.0–10.5)
WBC: 19.6 10*3/uL — ABNORMAL HIGH (ref 4.0–10.5)

## 2011-01-18 LAB — CREATININE, SERUM
Creatinine, Ser: 1.39 mg/dL — ABNORMAL HIGH (ref 0.50–1.35)
GFR calc Af Amer: >60 mL/min (ref >60)
GFR calc non Af Amer: 50 mL/min — ABNORMAL LOW (ref >60)

## 2011-01-18 LAB — GLUCOSE, CAPILLARY
Glucose-Capillary: 130 mg/dL — ABNORMAL HIGH (ref 70–99)
Glucose-Capillary: 155 mg/dL — ABNORMAL HIGH (ref 70–99)
Glucose-Capillary: 172 mg/dL — ABNORMAL HIGH (ref 70–99)
Glucose-Capillary: 176 mg/dL — ABNORMAL HIGH (ref 70–99)
Glucose-Capillary: 142 mg/dL — ABNORMAL HIGH (ref 70–99)
Glucose-Capillary: 191 mg/dL — ABNORMAL HIGH (ref 70–99)

## 2011-01-18 LAB — BASIC METABOLIC PANEL
CO2: 24 mEq/L (ref 19–32)
Calcium: 9 mg/dL (ref 8.4–10.5)
Chloride: 103 mEq/L (ref 96–112)
GFR calc Af Amer: >60 mL/min (ref >60)
Sodium: 136 mEq/L (ref 135–145)

## 2011-01-18 LAB — POCT I-STAT, CHEM 8
Chloride: 100 mEq/L (ref 96–112)
HCT: 32 % — ABNORMAL LOW (ref 39.0–52.0)
Potassium: 5 mEq/L (ref 3.5–5.1)
Sodium: 134 mEq/L — ABNORMAL LOW (ref 135–145)

## 2011-01-18 LAB — MAGNESIUM: Magnesium: 2.3 mg/dL (ref 1.5–2.5)

## 2011-01-19 ENCOUNTER — Inpatient Hospital Stay (HOSPITAL_COMMUNITY): Payer: Medicare Other

## 2011-01-19 LAB — PROTIME-INR: INR: 1.46 (ref 0.00–1.49)

## 2011-01-19 LAB — GLUCOSE, CAPILLARY
Glucose-Capillary: 109 mg/dL — ABNORMAL HIGH (ref 70–99)
Glucose-Capillary: 191 mg/dL — ABNORMAL HIGH (ref 70–99)
Glucose-Capillary: 115 mg/dL — ABNORMAL HIGH (ref 70–99)

## 2011-01-19 LAB — CBC
HCT: 28.8 % — ABNORMAL LOW (ref 39.0–52.0)
Hemoglobin: 9.5 g/dL — ABNORMAL LOW (ref 13.0–17.0)
RDW: 15.9 % — ABNORMAL HIGH (ref 11.5–15.5)
WBC: 15.1 10*3/uL — ABNORMAL HIGH (ref 4.0–10.5)

## 2011-01-19 LAB — BASIC METABOLIC PANEL
BUN: 32 mg/dL — ABNORMAL HIGH (ref 6–23)
Chloride: 98 mEq/L (ref 96–112)
GFR calc Af Amer: >60 mL/min (ref >60)
GFR calc non Af Amer: 51 mL/min — ABNORMAL LOW (ref >60)
Potassium: 4.2 mEq/L (ref 3.5–5.1)
Sodium: 133 mEq/L — ABNORMAL LOW (ref 135–145)

## 2011-01-19 LAB — CROSSMATCH
ABO/RH(D): B POS
Antibody Screen: NEGATIVE
Unit division: 0
Unit division: 0

## 2011-01-20 LAB — BASIC METABOLIC PANEL
CO2: 30 mEq/L (ref 19–32)
Chloride: 99 mEq/L (ref 96–112)
Creatinine, Ser: 1.15 mg/dL (ref 0.50–1.35)
GFR calc Af Amer: >60 mL/min (ref >60)
Potassium: 4.6 mEq/L (ref 3.5–5.1)
Sodium: 133 mEq/L — ABNORMAL LOW (ref 135–145)

## 2011-01-20 LAB — PROTIME-INR
INR: 1.45 (ref 0.00–1.49)
Prothrombin Time: 17.9 seconds — ABNORMAL HIGH (ref 11.6–15.2)

## 2011-01-20 LAB — GLUCOSE, CAPILLARY
Glucose-Capillary: 56 mg/dL — ABNORMAL LOW (ref 70–99)
Glucose-Capillary: 57 mg/dL — ABNORMAL LOW (ref 70–99)
Glucose-Capillary: 62 mg/dL — ABNORMAL LOW (ref 70–99)
Glucose-Capillary: 92 mg/dL (ref 70–99)
Glucose-Capillary: 68 mg/dL — ABNORMAL LOW (ref 70–99)
Glucose-Capillary: 67 mg/dL — ABNORMAL LOW (ref 70–99)
Glucose-Capillary: 68 mg/dL — ABNORMAL LOW (ref 70–99)
Glucose-Capillary: 80 mg/dL (ref 70–99)

## 2011-01-21 DIAGNOSIS — I5033 Acute on chronic diastolic (congestive) heart failure: Secondary | ICD-10-CM

## 2011-01-21 DIAGNOSIS — E119 Type 2 diabetes mellitus without complications: Secondary | ICD-10-CM

## 2011-01-21 LAB — GLUCOSE, CAPILLARY
Glucose-Capillary: 54 mg/dL — ABNORMAL LOW (ref 70–99)
Glucose-Capillary: 91 mg/dL (ref 70–99)
Glucose-Capillary: 77 mg/dL (ref 70–99)
Glucose-Capillary: 69 mg/dL — ABNORMAL LOW (ref 70–99)
Glucose-Capillary: 89 mg/dL (ref 70–99)

## 2011-01-21 LAB — BASIC METABOLIC PANEL
BUN: 34 mg/dL — ABNORMAL HIGH (ref 6–23)
CO2: 28 mEq/L (ref 19–32)
Calcium: 9.5 mg/dL (ref 8.4–10.5)
GFR calc non Af Amer: >60 mL/min (ref >60)
Glucose, Bld: 80 mg/dL (ref 70–99)
Sodium: 132 mEq/L — ABNORMAL LOW (ref 135–145)

## 2011-01-22 ENCOUNTER — Ambulatory Visit: Payer: Medicare Other | Admitting: Cardiology

## 2011-01-22 LAB — GLUCOSE, CAPILLARY
Glucose-Capillary: 128 mg/dL — ABNORMAL HIGH (ref 70–99)
Glucose-Capillary: 101 mg/dL — ABNORMAL HIGH (ref 70–99)

## 2011-01-22 LAB — BASIC METABOLIC PANEL
Calcium: 9.4 mg/dL (ref 8.4–10.5)
Creatinine, Ser: 1.03 mg/dL (ref 0.50–1.35)
GFR calc non Af Amer: >60 mL/min (ref >60)
Glucose, Bld: 69 mg/dL — ABNORMAL LOW (ref 70–99)
Sodium: 133 mEq/L — ABNORMAL LOW (ref 135–145)

## 2011-01-23 LAB — BASIC METABOLIC PANEL
Calcium: 9.8 mg/dL (ref 8.4–10.5)
Chloride: 96 mEq/L (ref 96–112)
Creatinine, Ser: 1.15 mg/dL (ref 0.50–1.35)
GFR calc Af Amer: >60 mL/min (ref >60)
GFR calc non Af Amer: >60 mL/min (ref >60)

## 2011-01-23 LAB — PROTIME-INR
INR: 1.42 (ref 0.00–1.49)
Prothrombin Time: 17.6 seconds — ABNORMAL HIGH (ref 11.6–15.2)

## 2011-01-23 LAB — GLUCOSE, CAPILLARY: Glucose-Capillary: 146 mg/dL — ABNORMAL HIGH (ref 70–99)

## 2011-01-23 LAB — MAGNESIUM: Magnesium: 2.2 mg/dL (ref 1.5–2.5)

## 2011-01-24 LAB — PROTIME-INR
INR: 1.77 — ABNORMAL HIGH (ref 0.00–1.49)
Prothrombin Time: 20.9 seconds — ABNORMAL HIGH (ref 11.6–15.2)

## 2011-01-24 LAB — GLUCOSE, CAPILLARY
Glucose-Capillary: 143 mg/dL — ABNORMAL HIGH (ref 70–99)
Glucose-Capillary: 136 mg/dL — ABNORMAL HIGH (ref 70–99)

## 2011-01-28 ENCOUNTER — Encounter: Payer: Self-pay | Admitting: Cardiology

## 2011-01-28 LAB — PROTIME-INR

## 2011-02-03 ENCOUNTER — Telehealth: Payer: Self-pay | Admitting: Cardiology

## 2011-02-03 NOTE — Telephone Encounter (Signed)
Called patient's home patient wife Bonita Quin was not home according to  [place], and patient is in a rehabilitation facility. According to our records,  Patient  Had redo- aortic valve replacement on 01/17/11. Patient was d/c from the hospital 08/02/ 12 . He has not been seen in the office post- op. Pt grandson  Will   let pt's wife know  to call the office  Tomorrow. He verbalized understanding.

## 2011-02-03 NOTE — Telephone Encounter (Signed)
Per pt wife Bonita Quin calling, S/p open heart surgery on 7/27  . C/O retaining fluid. Abdomen& feet.  Has the amount of furosemide been changed.

## 2011-02-04 NOTE — Telephone Encounter (Signed)
I spoke with Bonita Quin about pt increased edema.  She states feet, legs and abdomen are swollen. Pt is currently in a rehab center.  Reviewed discharge dose of lasix per her request.   I offered her a 12:00pm appt with PA Tereso Newcomer tomorrow (02/05/11). Refused appt and said pt would be discharged from rehab on Friday. Bonita Quin does say the " rehab md is aware of edema and they are taking care of it." Mylo Red RN

## 2011-02-06 DIAGNOSIS — I350 Nonrheumatic aortic (valve) stenosis: Secondary | ICD-10-CM

## 2011-02-08 ENCOUNTER — Telehealth: Payer: Self-pay | Admitting: Nurse Practitioner

## 2011-02-08 NOTE — Telephone Encounter (Signed)
Initially, pts. dtr called w/ questions pertaining to pts meds as he was just d/c'd from rehab.  She asked if i could call pts wife to discuss meds.  i called pts wife and went down d/c medlist with her from rehab.  She had concerns about pt taking lopressor, which was rx at time of d/c from hospital 8/2 and he was apparently receiving @ rehab, b/c pt prev allergic to toprol (though has tol lopressor).  i rec cont lopressor 12.5 bid as rx.  She also had question as to why metformin was increased from 500-1000bid.  i advised that there was no way for me to no but could speculate that his sugars were running high in rehab.  She says they have been low and plans to give him 500 bid only.  i rec that they f/u w/ primary care.  He was d/c'd on 60 of lasix daily but he only has 80mg  tabs @ home.  i rec they fill the 60 (40 - 1.5 tabs).  He was d/c'd on coumadin 4 daily but has 5mg  tabs @ home - i rec he take the 4mg  as rx and have inr followed up this week.  pts wife verbalized understanding and is planning on calling into office on Monday for appt sooner than currently scheduled.  Pt is otherwise stable.

## 2011-02-10 ENCOUNTER — Telehealth: Payer: Self-pay | Admitting: Cardiology

## 2011-02-10 ENCOUNTER — Encounter: Payer: Self-pay | Admitting: Surgery

## 2011-02-10 ENCOUNTER — Ambulatory Visit: Payer: Medicare Other | Admitting: Internal Medicine

## 2011-02-10 ENCOUNTER — Other Ambulatory Visit: Payer: Self-pay | Admitting: Surgery

## 2011-02-10 DIAGNOSIS — I359 Nonrheumatic aortic valve disorder, unspecified: Secondary | ICD-10-CM

## 2011-02-10 NOTE — Telephone Encounter (Signed)
Scheduled for 8/23 @ 0930.

## 2011-02-10 NOTE — Op Note (Signed)
[name], [name]                ACCOUNT NO.:  [id number]  MEDICAL RECORD NO.:  [id number]  LOCATION:  2309                         FACILITY:  [facility]  PHYSICIAN:  Evelene Croon, M.D.     DATE OF BIRTH:  June 29, 1934  DATE OF PROCEDURE:  01/17/2011 DATE OF DISCHARGE:                              OPERATIVE REPORT   PREOPERATIVE DIAGNOSIS:  Severe prosthetic aortic valve stenosis.  POSTOPERATIVE DIAGNOSIS:  Severe prosthetic aortic valve stenosis.  PROCEDURE:  Redo median sternotomy, extracorporeal circulation, redo aortic valve replacement using a 23-mm Edwards pericardial Magna-Ease valve.  ATTENDING SURGEON:  Evelene Croon, MD  ASSISTANT:  Bartholome Bill, RNFA  ANESTHESIA:  General endotracheal.  CLINICAL HISTORY:  This patient is a 76 year old gentleman with history of diabetes, hypertension, hyperlipidemia, as well as peripheral vascular disease.  He has a history of chronic atrial fibrillation on Coumadin and also underwent permanent pacemaker placed in the past.  He underwent coronary artery bypass x2 and aortic valve replacement using a 25-mm Edwards pericardial Magna valve in 2006.  He has subsequently developed calcification of his aortic valve prosthesis with progressive stenosis.  TEE on January 08, 2011, showed that only one leaflet was moving with a mean gradient of 52 and a peak gradient of 76.  Ejection fraction was 55-60%.  There is mild mitral regurgitation.  There was some left atrial appendage thrombus noted and the patient was admitted for heparin therapy until therapeutic on Coumadin.  Cardiac catheterization was done on January 08, 2011, and showed patent saphenous vein graft to diagonal and distal obtuse marginal.  The LAD had 50% proximal stenosis.  The right coronary artery had 80% distal posterior descending lesion.  Due to his worsening symptoms of congestive heart failure requiring higher dose Lasix, it was felt that redo aortic valve replacement was  indicated.  I felt it would be best to use a tissue valve in this patient given his age and underlying medical problems.  I discussed the operative procedure with he and his daughter including alternatives, benefits, and risks including, but not limited to bleeding, blood transfusion, infection, stroke, myocardial infarction, displacement of the pacemaker lead requiring revision, organ dysfunction, and death.  He understood and agreed to proceed.  There was also in agreement with using a tissue valve.  OPERATIVE PROCEDURE:  The patient was taken to the operative room and placed on table in supine position.  After induction of general endotracheal anesthesia, a Foley catheter was placed in bladder using sterile technique.  Then the chest, abdomen and both lower extremities were prepped and draped in usual sterile manner.  TEE was performed by Anesthesiology and showed severe prosthetic valve calcification and stenosis.  Only one of leaflets was moving and not very well.  There was moderate concentric left ventricular hypertrophy.  The mitral valve had mild regurgitation.  Then, the chest was entered through the previous median sternotomy incision.  The sternal wires were removed and the sternum opened using oscillating saw without difficulty.  Bone hooks were used to retract the edges of the sternum and the heart was dissected from the back of pericardium.  The chest retractor was placed.  Dissection was performed to  expose the right atrium and ascending aorta.  The previous vein grafts were identified and preserved.  There were moderately dense adhesions.  The patient was then heparinized when adequate ACT was obtained.  The distal ascending aorta was cannulated using a 20-French aortic cannula for arterial inflow.  Venous outflow was achieved using a two-stage venous cannula for the right atrial appendage.  The patient did have significant calcified plaque present in the aortic  arch beginning at about the level of the innominate artery.  The ascending aorta was thickened, but soft.  The patient then placed on cardiopulmonary bypass and the remainder of the right atrium was dissected free.  Then, the right superior pulmonary vein was identified and a left ventricular vent was placed through a pursestring suture.  A retrograde cardioplegic cannulas placed through a pursestring suture in the right atrium and advanced in the coronary sinus.  An antegrade cardioplegia and vent cannula was inserted into the mid ascending aorta.  Then, the aorta was crossclamped and 800 mL of cold blood antegrade cardioplegia was administered in the aortic root with quick arrest of the heart.  Systemic hypothermia to 20 degrees centigrade and topical hypothermic iced saline was used.  Temperature probe placed in septum. The majority of the heart was not dissected free from pericardium. Additional doses of cold blood retrograde cardioplegia were given about 20-minute intervals to maintain myocardial temperature around 10 degrees centigrade.  Then, the previous aortotomy incision was opened.  Examination of the prosthetic valve showed that there was complete calcification of 2 of the leaflets.  The third leaf was partially calcified and poorly mobile. There was marked mitral annular calcification and significant pannus formation over the stented portion of the prosthetic valve.  Then with great care, the prosthetic valve was removed from the aortic annulus. This took considerable amount of time due to the adhesion to the aortic annulus and the pannus which covered the stented portion of the prosthetic valve.  The commissural posts were firmly attached to the commissures.  I was able to remove the prosthetic valve without injuring the surrounding structures.  Care was taken to move all particulate debris.  Left ventricle and aortic root were copiously irrigated with iced saline  solution.  The right and left coronary ostia were identified were not obstructed.  Then, the annulus was sized and a 23-mm Edwards Magna-Ease  pericardial valve was chosen.  This had model number [id number].  Serial number was [id number].  Then a series of pledgeted 2-0 Ethibond horizontal mattress sutures were placed around the aortic annulus in a subannular position.  These sutures then placed through the sewing ring of the valve lowered in place.  The valve seated nicely. Coronary ostia were not obstructed.  Then, the aortotomy was closed in two layers using continuous 4-0 Prolene with false strips to reinforce the aortic closure.  The patient rewarmed to 37 degrees centigrade.  Two temporary right ventricular and right atrial pacing wires placed above the skin.  Then, the left side of the heart was de-aired.  Head was placed in Trendelenburg position.  Crossclamp removed time 171 minutes. There was spontaneous return of paced rhythm.  When the patient rewarmed to 37 degrees centigrade, he was weaned from cardiopulmonary bypass on low-dose dopamine.  Total bypass time was 240 minutes.  Cardiac function appeared good with cardiac output of 5 liters minute.  TEE showed a normal functioning prosthetic aortic valve.  There was no evidence of perivalvular leak or regurgitation through the valve.  There was no significant mitral regurgitation.  The left ventricular function appeared well preserved.  Then, protamine was given and the venous and aortic cannulae without difficulty.  Hemostasis was achieved without difficulty.  Three chest tubes were placed with 2 in post pericardium, one in the left pleural space, one anterior mediastinum.  Sternum was then closed with double #6 stainless steel wires.  The fascia was closed with continuous #1 Vicryl suture.  Subcutaneous tissue was closed with continuous 2-0 Vicryl and skin with 3-0 Vicryl subcuticular closure.  The sponge, needles and instrument  counts were correct for the scrub nurse.  Dry sterile dressing applied over the incision around the chest tubes which were left for Pleur-Evac suction.  The patient remained hemodynamically stable, transferred to the SICU in guarded, but stable condition.     Evelene Croon, M.D.     BB/MEDQ  D:  01/17/2011  T:  01/18/2011  Job:  [id number]  cc:   Marca Ancona, MD  Electronically Signed by Evelene Croon M.D. on 02/10/2011 03:58:30 PM

## 2011-02-10 NOTE — Discharge Summary (Signed)
[name], SENSING                ACCOUNT NO.:  [id number]  MEDICAL RECORD NO.:  [id number]  LOCATION:                                 FACILITY:  PHYSICIAN:  Evelene Croon, M.D.     DATE OF BIRTH:  June 14, 1935  DATE OF ADMISSION: DATE OF DISCHARGE:                              DISCHARGE SUMMARY   FINAL DIAGNOSES: 1. Severe prosthetic aortic valve stenosis. 2. Left atrial appendage thrombus.  IN-HOSPITAL DIAGNOSES: 1. Acute blood loss anemia postoperatively. 2. Volume overload postoperatively.  SECONDARY DIAGNOSES: 1. Diabetes mellitus. 2. Hyperlipidemia. 3. Hypertension. 4. History of coronary artery disease and aortic stenosis status post     coronary artery bypass grafting and aortic valve replacement in     January 2006. 5. History of sick sinus syndrome status post permanent pacemaker     placement. 6. History of chronic atrial fibrillation, treated on chronic     Coumadin. 7. History of carotid stenosis. 8. History of peripheral vascular disease, treated with percutaneous     intervention in 2011. 9. 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  IN-HOSPITAL OPERATIONS AND PROCEDURES: 1. Cardiac catheterization done by Dr. Shirlee Latch, January 08, 2011. 2. Redo median sternotomy for redo aortic valve replacement using a 23-     mm Edwards pericardial Magna Ease valve.  This was done by Dr.     [name], January 17, 2011.  HISTORY AND PHYSICAL AND HOSPITAL COURSE:  The patient is a 76 year old gentleman with history of diabetes, hypertension, hyperlipidemia, as well as peripheral vascular disease.  He has a history of chronic atrial fibrillation, on Coumadin, and also underwent permanent pacemaker placement in the past.  He underwent coronary artery bypass grafting x2 and aortic valve replacement using 25-mm Edwards pericardial Magna valve in 2006.  The patient subsequently developed calcification of his aortic valve prosthesis with progressive stenosis.  TEE on  January 08, 2011 showed that only one leaflet was moving with a mean gradient of 52 and peak gradient of 76.  Ejection fraction was 55%-60%.  There was mild mitral regurgitation.  There is some left atrial appendage thrombus noted at that time.  It was felt that the patient would require admission to [facility] for IV heparin for the left atrial appendage thrombus until therapeutic on Coumadin.  For further details of the patient's past medical history and physical exam, please see dictated H and P.  The patient was admitted to [facility] on January 08, 2011, with left atrial appendage thrombus and started on IV heparin as well as Coumadin. On admission, the patient was seen and evaluated by Dr. Dorris Fetch regarding the patient's severe prosthetic aortic valve stenosis.  Dr. Dorris Fetch agreed that the patient should be treated with IV heparin at this time for his thrombus as well as Coumadin.  Plan is the patient was discharged to home and can follow up with Dr. [name] for further management of severe prosthetic aortic valve stenosis.  The patient did undergo cardiac catheterization by Dr. Shirlee Latch on January 09, 2011, which showed 50% proximal left anterior descending lesion.  Note, the patient had patent bypass graft.  There is moderate distal right  coronary artery, posterior left ventricular, and posterior descending coronary artery disease.  There is no definite target vessel revascularization. While the patient was in the hospital, receiving IV heparin until became therapeutic on Coumadin.  Dr. [name] was consulted.  He did see the patient in-house and recommended that the patient undergo redo aortic valve replacement due to his worsening symptoms of diastolic congestive heart failure and pulmonary hypertension.  He discussed with the patient risks and benefits of this procedure.  The patient is also understanding and agreed to proceed.  The patient was scheduled for surgery for  January 17, 2011.  He did have bilateral carotid duplex ultrasound done showing no significant ICA stenosis.  The patient remained stable preoperatively.  The patient was taken to the operating room on January 17, 2011 where he underwent redo median sternotomy for redo aortic valve replacement using a 23-mm Edwards pericardial Magna Ease valve.  This was done by Dr. [name] January 17, 2011.  The patient tolerated this procedure well and transferred to the surgical intensive care unit in stable condition. The patient was noted to be hemodynamically stable postoperatively.  He was extubating evening of surgery.  Postextubation, the patient was noted to be alert and oriented x4.  Neuro intact.  The patient is noted to have chronic atrial fibrillation with V paced postoperatively.  The patient's blood pressure noted to be stable and he was able to be weaned off all drips.  He was started on low-dose beta-blocker.  He has remained paced during his postoperative course.  External pacing wires were able to be discontinued without difficulty.  Blood pressure remained stable.  Postoperatively, the patient was noted to develop pulmonary hypertension.  Chest x-ray obtained postop day #1 showed persistent vascular congestion.  The patient was started on diuretics. The patient's chest tubes had minimal drainage postoperatively and these were discontinued in routine fashion.  His most recent chest x-ray on July 29 showed no significant change with mild interstitial edema noted. He has continued on diuretics during this time.  The patient was encouraged to use his incentive spirometer.  He currently remains on 2 liters of oxygen with O2 sats greater than 90%.  We will attempt to wean the patient off oxygen to maintain O2 sats greater than 90% on room air. If unable to do this, the patient will be transferred to skilled nursing facility on oxygen.  The patient was started on Coumadin postoperatively for his  left atrial appendage thrombus.  Daily PT and INRs were obtained and Coumadin was adjusted appropriately.  Most recent INR on January 23, 2011 was 1.42.  We will plan to discharge the patient home on his Coumadin dose preoperatively.  Postoperatively, the patient to have mild acute blood loss anemia.  Hemoglobin and hematocrit followed closely and noted to be stable.  He did not require blood transfusions postoperatively.  The patient is a known diabetic and was started on Lantus insulin postoperatively.  Blood sugars followed.  He started tolerating diet and was restarted on his metformin.  The patient started developing hypoglycemic episodes and Lantus insulin was eventually discontinued.  Currently, the patient's blood sugars were well controlled on his p.o. metformin.  We will continue to monitor.  The patient was transferred out to the SICU to 2000 on postop day #3. While in the unit on the floor, the patient was up ambulating with assistance.  He was tolerating diet well.  No nausea and vomiting noted. All incisions were clean, dry, and intact and  healing well.  He was continued on Lasix as well as Aldactone for his volume access.  Daily weights were followed.  He currently remains 7.5 kg above his preoperative weight.  Case management was consulted due to the patient's family.  He requires skilled nursing facility.  He was seen and evaluated in skilled nursing facility, search was started.  Currently, awaiting placement.  On January 23, 2011, postop day #6, the patient was noted to be afebrile, V paced.  Blood pressure is stable.  O2 sats greater than 90% on 2 liters.  Lab work shows sodium of 135, potassium 4.3, chloride of 96, bicarbonate 32, BUN of 30, creatinine 1.15, glucose 77.  INR of 1.42.  White blood cell count 15.1, hemoglobin 9.5, hematocrit 28.8, with a platelet count 79.  The patient is tentatively ready for transfer to skilled nursing facility, pending arrangements  made, and he remained stable in the a.m.  FOLLOWUP APPOINTMENTS:  A follow-up appointment was arranged with Dr. [name] on February 11, 2011 at 3:45 p.m..  The patient will need to obtain PA and lateral chest x-ray 45 minutes prior to this appointment.  The patient will need to follow up with Dr. Shirlee Latch in 2 weeks.  He need to contact this office to make these arrangements.  He also need to follow up with his primary care physician in 2-4 weeks for diabetes management.  ACTIVITY:  The patient instructed no driving until released to do so, no heavy lifting over 10 pounds.  He is told to ambulate three to four times per day, progress as tolerated, and continue his breathing exercises.  INCISIONAL CARE:  The patient is told to shower, washing his incisions using soap and water.  He is to contact the office if he develops any drainage or opening from any of his incision sites.  DIET:  The patient educated on diet to be low-fat, low-salt, as well as diabetic diet.  DISCHARGE MEDICATIONS: 1. Lopressor 12.5 mg b.i.d. 2. Oxycodone 5 mg 1-2 tablets q.3 h. p.r.n. pain. 3. Lasix 40 mg b.i.d. 4. Spironolactone 25 mg daily. 5. Apidra sliding scale 5-10 units subcu t.i.d. before meals. 6. Enteric-coated aspirin 81 mg daily. 7. Glucophage XR 500 mg b.i.d. 8. Vytorin 10/40 mg daily. 9. Coumadin 5 mg.  The patient is to take 2 tablets on Wednesday and 1     tablet on all other days.     Sol Blazing, PA   ______________________________ Evelene Croon, M.D.    KMD/MEDQ  D:  01/23/2011  T:  01/23/2011  Job:  [id number]  cc:   Marca Ancona, MD  Electronically Signed by Cameron Proud PA on 02/05/2011 08:38:08 AM Electronically Signed by Evelene Croon M.D. on 02/10/2011 03:58:35 PM

## 2011-02-10 NOTE — Telephone Encounter (Signed)
Have him come in to see me this week.  He can be overbooked.

## 2011-02-10 NOTE — Telephone Encounter (Signed)
Pt is SOB and they have questions about his medication and some changes that where made and she wants her husband to be seen this week/lg

## 2011-02-10 NOTE — Telephone Encounter (Signed)
Patient has been experiencing some dyspnea with slight activity and with rest. He had an AV replacement 01/17/11. At the skilled nursing facility, his Lasix was decreased to 60 mg twice daily but he is still taking his prior Lasix dose of 160 mg daily.There is a lot of confusion regarding his medications. His LE are swollen although he has not gained any weight. Mild chest discomfort only around the incision. He is going to f/u with Dr. [name] tomorrow. He was supposed to f/u with Tereso Newcomer on 02/18/11 but refuses to see him. Advised him that I will route to Dr. Shirlee Latch for his opinion.  Dr.Mclean- Do you want him seen this week? He is seeing [employer]. If not, can I add him to your schedule next Wednesday morning?

## 2011-02-10 NOTE — Consult Note (Signed)
NAMETOURE, EDMONDS                ACCOUNT NO.:  [id number]  MEDICAL RECORD NO.:  [id number]  LOCATION:  2033                         FACILITY:  [facility]  PHYSICIAN:  Evelene Croon, M.D.     DATE OF BIRTH:  [birth year]  DATE OF CONSULTATION:  01/13/2011 DATE OF DISCHARGE:                                CONSULTATION   REFERRING PHYSICIAN:  Marca Ancona, MD  REASON FOR CONSULTATION:  Severe prosthetic aortic valve stenosis.  CLINICAL HISTORY:  I was asked by Dr. Shirlee Latch to evaluate Mr. Madilyn Hook for consideration of redo aortic valve replacement.  He is a 76 year old gentleman who underwent coronary artery bypass graft surgery x2 and aortic valve replacement using a 25-mm Edwards pericardial magna valve in 2006.  He had a saphenous vein graft to a diagonal coronary artery and a saphenous vein graft to a distal left circumflex obtuse marginal branch.  At that surgery, we decided to use a tissue valve given his age and coexisting coronary artery disease and multiple other medical problems.  I felt the risk for structural valve deterioration in his lifetime was fairly low and the risk of relegating him to lifelong anticoagulation was higher than the risk of structural valve deterioration.  Unfortunately, he did develop calcification of the aortic valve prosthesis.  This was followed by Dr. Charlies Constable until he retired and the patient has been followed by Dr. Marca Ancona.  He has developed progressive symptoms of congestive heart failure requiring high-dose Lasix.  He has stable dyspnea after walking about 1 block on flag round and gets short of breath with going up and down stairs.  He has had no orthopnea, PND, or chest pain.  He had a recent 2-D echocardiogram showing his mean aortic valve gradient had increased to 46 mmHg.  He, therefore, underwent a transesophageal echocardiogram on January 08, 2011.  This showed the bioprosthetic aortic valve being calcified.  Only one leaflet appeared to  be moving.  There is a mean gradient of 52 mmHg and a peak gradient of 76 mmHg consistent with severe stenosis.  There is no significant insufficiency.  There is mild mitral regurgitation with moderate mitral annular calcification.  There is mild tricuspid regurgitation.  Left ventricle ejection fraction was 55-60% with mild left ventricular hypertrophy.  There is a permanent pacemaker lead noted in the right ventricle with some attached amorphous material.  There is also some amorphous material noted in the left atrial appendage consistent with a disorganized thrombus.  The patient also underwent cardiac catheterization on January 08, 2011, which showed a patent saphenous vein graft to diagonal and a patent saphenous vein graft to the distal obtuse marginal.  The LAD had about 50% proximal stenosis.  The diagonal had 90% proximal stenosis.  Left circumflex was occluded in its midportion, but again the distal left circumflex was supplied by the vein graft.  The right coronary artery was calcified and dominant.  There were nonsignificant narrowings throughout with about 80% distal posterior descending stenosis.  The patient was admitted for anticoagulation with heparin since he was subtherapeutic on Coumadin and was noted to have some thrombus in the left atrial appendage.  REVIEW OF SYSTEMS:  GENERAL:  He denies any fever or chills.  He has had no recent weight changes but has lost weight since his prior surgery. He does report some fatigue.  EYES:  Negative.  ENT:  Negative. ENDOCRINE:  He has adult-onset diabetes.  He denies hypothyroidism. CARDIOVASCULAR:  As noted above, he has had no chest pain or pressure. He denies PND and orthopnea.  He has had some peripheral edema.  He denies palpitations.  RESPIRATORY:  Denies cough and sputum production. He does have some dyspnea on exertion.  GI:  He denies nausea or vomiting.  Denies melena and bright red blood per rectum.  GU:  He denies  dysuria and hematuria.  He does have a history of kidney stones. NEUROLOGICAL:  He denies any focal weakness or numbness.  He denies dizziness and syncope.  He has never had a TIA or stroke.  He does have a history of bilateral carotid stenoses with 40-59% bilateral ICA stenoses noted in June 2012.  PSYCHIATRIC:  Negative.  HEMATOLOGIC:  He denies any history of bleeding disorders or easy bleeding.  ALLERGIES: LOPRESSOR, POTASSIUM, and ACTOS.  PERIPHERAL VASCULAR:  He does have a history of lower extremity peripheral vascular disease and undergone prior interventions for that.  His past medical history is significant for: 1. Diabetes. 2. Hyperlipidemia. 3. Hypertension. 4. He has a history of coronary artery disease and aortic stenosis,     status post coronary artery bypass graft surgery and aortic valve     replacement in January 2006 as noted above. 5. He has a history of prosthetic valve deterioration and stenosis as     above. 6. He has a history of sick sinus syndrome, status post permanent     placement. 7. He has a history of chronic atrial fibrillation treated with     Coumadin. 8. He has a history of carotid stenosis as mentioned above. 9. He has a history of peripheral vascular disease, treated with     percutaneous intervention in 2011. 10.He underwent amputation of his left great toe in August 2011.  SOCIAL HISTORY:  He is married and lives in [place].  He is a retired [employer] architect.  He quit smoking in 1990.  FAMILY HISTORY:  His father died of myocardial infarction at 8 and mother died of stroke at 44.  PHYSICAL EXAMINATION:  GENERAL:  He is a well-developed, elderly white male in no distress. VITAL SIGNS:  Blood pressure is 144/71, pulse is 71 and regular, respiratory rate 18 unlabored, oxygen saturation on room air is 95%. HEENT:  Normocephalic and atraumatic.  Pupils are equal and reactive to light and accommodation.  Extraocular muscles are intact.   Oropharynx is clear. NECK:  Normal carotid pulses bilaterally.  There is a bruit or transmitted murmur to both sides of his neck.  There is no adenopathy or thyromegaly. CARDIAC:  An irregular rate and rhythm with a grade 3/6 crescendo- decrescendo murmur over the aorta.  There is no diastolic murmur.  There is a well-healed sternotomy scar.  Sternum is stable. LUNGS:  Clear. ABDOMEN:  Active bowel sounds.  His abdomen is soft, moderately obese, and nontender.  There are no palpable mass or organomegaly. EXTREMITIES:  Mild bilateral ankle edema.  Pedal pulses are not palpable.  There is a well-healed amputation site of the left great toe. Both feet are warm. NEUROLOGIC:  Alert and oriented x3.  Motor and sensory exams grossly normal. SKIN:  Warm and dry.  IMPRESSION:  Mr. Madilyn Hook has  calcific structural valve deterioration of his prosthetic pericardial aortic valve with severe prosthetic valve stenosis.  He has patent saphenous vein grafts with no need for coronary revascularization at this time.  He has worsening symptoms of diastolic congestive heart failure and pulmonary hypertension and I agree that redo aortic valve replacement is the best treatment for this.  I would plan to use a tissue valve again given his age and concomitant coronary artery disease and multiple other medical problems which would make mandating chronic anticoagulation with Coumadin a risky proposition. The current generation of bovine pericardial valve is being treated with an anti-calcification treatment and will, hopefully, have a better longevity for him.  I think the risk of structural valve deterioration in his age group remains very low.  I discussed the use of a tissue valve with him and my reasons.  He understands and is in agreement with using a tissue valve again.  I discussed the benefits and risks of surgery including but not limited to bleeding, blood transfusion, infection, stroke, myocardial  infarction, graft failure, structural valve deterioration, organ dysfunction, and death.  He understands all this and agrees to proceed.  Since he has some thrombus in his left atrial appendage and requires anticoagulation, I think it would be best to keep him on heparin and plan to do surgery on Friday of this week.  I doubt that we would be able to safely perform left atrial appendage ligation since this is a redo surgery and he has 2 saphenous vein grafts that are likely overlying his left atrial appendage.     Evelene Croon, M.D.     BB/MEDQ  D:  01/13/2011  T:  01/14/2011  Job:  [id number]  Electronically Signed by Evelene Croon M.D. on 02/10/2011 03:58:26 PM

## 2011-02-11 ENCOUNTER — Encounter: Payer: Self-pay | Admitting: Surgery

## 2011-02-11 ENCOUNTER — Encounter: Payer: Medicare Other | Admitting: Surgery

## 2011-02-11 ENCOUNTER — Ambulatory Visit
Admission: RE | Admit: 2011-02-11 | Discharge: 2011-02-11 | Disposition: A | Discharge status: Home / Self Care | Payer: Medicare Other | Source: Ambulatory Visit | Attending: Surgery | Admitting: Surgery

## 2011-02-11 ENCOUNTER — Ambulatory Visit (INDEPENDENT_AMBULATORY_CARE_PROVIDER_SITE_OTHER): Payer: Self-pay | Admitting: Surgery

## 2011-02-11 VITALS — BP 140/68 | HR 83 | Resp 20

## 2011-02-11 DIAGNOSIS — Z954 Presence of other heart-valve replacement: Secondary | ICD-10-CM

## 2011-02-11 DIAGNOSIS — Z952 Presence of prosthetic heart valve: Secondary | ICD-10-CM

## 2011-02-11 DIAGNOSIS — I350 Nonrheumatic aortic (valve) stenosis: Secondary | ICD-10-CM

## 2011-02-11 DIAGNOSIS — I359 Nonrheumatic aortic valve disorder, unspecified: Secondary | ICD-10-CM

## 2011-02-11 NOTE — Patient Instructions (Signed)
You may return to driving when you feel comfortable with that.  Do not lift anything heavier than 10 lbs for three months postoperatively. Return to see me if any problems develop with you incision; such as redness, swelling, or drainage.  

## 2011-02-12 ENCOUNTER — Encounter: Payer: Self-pay | Admitting: Surgery

## 2011-02-12 NOTE — Progress Notes (Signed)
  HPI  Patient returns for routine postoperative follow-up having undergone redo median sternotomy and redo aortic valve replacement using a 23 mm Edwards pericardial valve on 01/17/2011.. The patient's early postoperative recovery while in the hospital was notable for some volume excess which responded to diuresis. He has a history of chronic atrial fibrillation and was started back on Coumadin postoperatively. He was discharged to a skilled nursing facility but is now back home.. Since hospital discharge the patient reports that he is still very tired. He has been walking short distances without chest pain but tires fairly quickly..    Current outpatient prescriptions:aspirin EC 81 MG EC tablet, Take 1 tablet (81 mg total) by mouth daily., Disp: , Rfl: 0;  ezetimibe-simvastatin (VYTORIN) 10-40 MG per tablet, Take 1 tablet by mouth at bedtime.  , Disp: , Rfl: ;  furosemide (LASIX) 40 MG tablet, Take by mouth 2 (two) times daily. , Disp: , Rfl: ;  insulin glulisine (APIDRA) 100 UNIT/ML injection, Takes anywhere from 8 to 14 with some meals, Disp: , Rfl:  metFORMIN (GLUCOPHAGE-XR) 500 MG 24 hr tablet, Take 1 tab in the morning and 1 tablet in the evening. , Disp: , Rfl: ;  oxycodone (OXY-IR) 5 MG capsule, Take 5 mg by mouth every 4 (four) hours as needed.  , Disp: , Rfl: ;  spironolactone (ALDACTONE) 25 MG tablet, Take 25 mg by mouth daily.  , Disp: , Rfl: ;  warfarin (COUMADIN) 5 MG tablet, Take 1 tablet (5 mg total) by mouth as directed., Disp: 105 tablet, Rfl: 1 metoprolol tartrate (LOPRESSOR) 25 MG tablet, Take 12.5 mg by mouth 2 (two) times daily.  , Disp: , Rfl:     Physical Exam  He is alert and looks comfortable. Cardiac exam shows an irregular rate and rhythm. There is a soft systolic flow murmur across his aortic valve prosthesis. There is no diastolic murmur. Lung exam reveals crackles in the bases. There is mild bilateral lower extremity edema.    Diagnostic tests:  Chest x-ray  today shows mild bibasilar atelectasis.  Impression:  Overall [name] is making a slow but satisfactory recovery from his heart surgery. He had significant preoperative deconditioning and I would expect it is going to take him 3-6 months to make a good recovery.  Plan:  I encouraged him to continue walking as much as possible. He may return to driving a car when he feels comfortable with that but should refrain from lifting anything heavier than 10 pounds for a total of 3 months from the date of surgery. He has a followup appointment on Thursday of this week with Dr. Marca Ancona. His INR is being followed in the [place] anticoagulation clinic. He will return to see me if he develops any problems with his incisions.

## 2011-02-13 ENCOUNTER — Encounter: Payer: Self-pay | Admitting: Cardiology

## 2011-02-13 ENCOUNTER — Ambulatory Visit (INDEPENDENT_AMBULATORY_CARE_PROVIDER_SITE_OTHER): Payer: Medicare Other | Admitting: Cardiology

## 2011-02-13 ENCOUNTER — Ambulatory Visit (INDEPENDENT_AMBULATORY_CARE_PROVIDER_SITE_OTHER): Payer: Medicare Other | Admitting: Internal Medicine

## 2011-02-13 DIAGNOSIS — I6529 Occlusion and stenosis of unspecified carotid artery: Secondary | ICD-10-CM

## 2011-02-13 DIAGNOSIS — I7389 Other specified peripheral vascular diseases: Secondary | ICD-10-CM

## 2011-02-13 DIAGNOSIS — I4891 Unspecified atrial fibrillation: Secondary | ICD-10-CM

## 2011-02-13 DIAGNOSIS — D649 Anemia, unspecified: Secondary | ICD-10-CM

## 2011-02-13 DIAGNOSIS — I2510 Atherosclerotic heart disease of native coronary artery without angina pectoris: Secondary | ICD-10-CM

## 2011-02-13 DIAGNOSIS — M898X9 Other specified disorders of bone, unspecified site: Secondary | ICD-10-CM

## 2011-02-13 DIAGNOSIS — I509 Heart failure, unspecified: Secondary | ICD-10-CM

## 2011-02-13 DIAGNOSIS — R0602 Shortness of breath: Secondary | ICD-10-CM

## 2011-02-13 DIAGNOSIS — R200 Anesthesia of skin: Secondary | ICD-10-CM

## 2011-02-13 DIAGNOSIS — I359 Nonrheumatic aortic valve disorder, unspecified: Secondary | ICD-10-CM

## 2011-02-13 DIAGNOSIS — I25810 Atherosclerosis of coronary artery bypass graft(s) without angina pectoris: Secondary | ICD-10-CM

## 2011-02-13 DIAGNOSIS — E785 Hyperlipidemia, unspecified: Secondary | ICD-10-CM

## 2011-02-13 DIAGNOSIS — G5790 Unspecified mononeuropathy of unspecified lower limb: Secondary | ICD-10-CM

## 2011-02-13 DIAGNOSIS — M899 Disorder of bone, unspecified: Secondary | ICD-10-CM

## 2011-02-13 DIAGNOSIS — R209 Unspecified disturbances of skin sensation: Secondary | ICD-10-CM

## 2011-02-13 DIAGNOSIS — R5383 Other fatigue: Secondary | ICD-10-CM

## 2011-02-13 DIAGNOSIS — Z954 Presence of other heart-valve replacement: Secondary | ICD-10-CM

## 2011-02-13 DIAGNOSIS — I5032 Chronic diastolic (congestive) heart failure: Secondary | ICD-10-CM

## 2011-02-13 LAB — BRAIN NATRIURETIC PEPTIDE: Pro B Natriuretic peptide (BNP): 174 pg/mL — ABNORMAL HIGH (ref 0.0–100.0)

## 2011-02-13 LAB — BASIC METABOLIC PANEL
BUN: 17 mg/dL (ref 6–23)
Calcium: 9.4 mg/dL (ref 8.4–10.5)
Creatinine, Ser: 1.1 mg/dL (ref 0.4–1.5)
GFR: 71.1 mL/min (ref >60.00)

## 2011-02-13 LAB — VITAMIN B12: Vitamin B-12: 412 pg/mL (ref 211–911)

## 2011-02-13 MED ORDER — LISINOPRIL 5 MG PO TABS: 5.0000 mg | ORAL_TABLET | Freq: Every day | ORAL | Status: DC

## 2011-02-13 NOTE — Patient Instructions (Addendum)
Start Lisinopril 5mg  daily.  Your physician recommends that you have  lab work today--BMP/BNP/B12  Your physician has requested that you have an echocardiogram. Echocardiography is a painless test that uses sound waves to create images of your heart. It provides your doctor with information about the size and shape of your heart and how well your heart's chambers and valves are working. This procedure takes approximately one hour. There are no restrictions for this procedure.    Your physician recommends that you schedule a follow-up appointment in: 1 month with Dr Shirlee Latch.

## 2011-02-15 NOTE — Assessment & Plan Note (Signed)
Stable moderate disease, repeat in 6/13. 

## 2011-02-15 NOTE — Progress Notes (Signed)
PCP: Dr. Drue Novel  76 yo with history of AS s/p redo AVR, diastolic CHF, CAD, chronic atrial fibrillation, s/p pacemaker presents for cardiology followup.  Patient had redo AVR with #23 Edwards pericardial valve on 7/27.  Cath prior to surgery showed stable disease with patent grafts.  Patient was recently discharged from rehab.  Weight is down 16 lbs since last appointment prior to surgery.  He is doing some walking around his house and has physical therapy coming to his house as well.  He is mildly dyspneic after walking about 50 feet but seems to be getting better.  No chest pain.  Lower extremity edema has improved.    ECG: Atrial fibrillation, v-paced  Labs (1/12): LDL 71, HDL 29, K 4.7, creatinine 1.1 Labs (4/12): K 4, creatinine 1.0, BNP 262 Labs (7/12): LDL 92, HDL 32 Labs (8/12): K 4.3, creatinine 1.15  PMH:  1. Aortic stenosis: s/p bioprosthetic aortic valve replacement in 06/2004.  Echo in 7/11 showed mean gradient 35 mmHg across the bioprosthetic valve.  Echo in 6/12 showed worsening of the bioprosthetic mean gradient to 46 mmHg.  TEE in 7/12 showed only 1 cusp of the bioprosthetic aortic valve moving (heavily calcified), mean 52 mmHg/peak gradient 76 mmHg. Patient had redo AVR (7/12) with #23 Edwards pericardial valve.  2. CAD: CABG with AVR in 1/06.  Patient had SVG-OM and SVG-D.  LHC (7/12) showed 50% distal RCA lesion extending into PLV and PDA, total occlusion CFX, 50% pLAD, 90% ostial D1, patent SVG-D1 and SVG-OM.  3. Diabetes 4. Hyperlipidemia 5. HTN 6. Sick sinus syndrome with Medtronic dual chamber PCM 7. Obesity 8. OSA: Not using CPAP 9. Chronic diastolic CHF: Echo in 7/11 showed EF 60-65% with moderate LV hypertrophy, moderate diastolic dysfunction, mild mitral stenosis, bioprosthetic aortic valve with mean gradient 35 mmHg (moderate stenosis) and PA systolic pressure 69 mmHg.   Echo (6/12) showed EF 60-65%, moderate LV hypertrophy, bioprosthetic aortic valve with mean gradient  46 mmHg (severe stenosis) and PA systolic pressure 74 mmHg, mild MR.  10. Nephrolithiasis 11. PAD: Has seen [facility].  Lower extremity angiography in 9/10 showed occlusion of all 3 vessels below the knees in both legs.  Patient then apparently had femoral artery PCI by Dr. Myra Gianotti in 2011.  He had amputation of the left great toe in 8/11.  12. Atrial fibrillation: Chronic, on coumadin.  LAA thrombus noted on TEE in 7/12.  13. Carotid stenosis: carotid dopplers (7/11) with 40-59% bilateral ICA stenosis.  Carotids (6/12) with 40-59% bilateral ICA stenosis.  14. Beta blocker intolerance.   SH: Married, lives in [place].  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   FH: Father with MI at 3, mother with CVA at 67  ROS: All systems reviewed and negative except as per HPI.   Current Outpatient Prescriptions  Medication Sig Dispense Refill  . aspirin EC 81 MG EC tablet Take 1 tablet (81 mg total) by mouth daily.    0  . ezetimibe-simvastatin (VYTORIN) 10-40 MG per tablet Take 1 tablet by mouth at bedtime.        . furosemide (LASIX) 80 MG tablet Take 80 mg by mouth 2 (two) times daily.        . insulin glulisine (APIDRA) 100 UNIT/ML injection Takes anywhere from 8 to 14 with some meals      . metFORMIN (GLUCOPHAGE-XR) 500 MG 24 hr tablet Take 500 mg  by mouth 2 (two) times daily. Take 1 tab in the morning and 1 tablet in the evening.      Marland Kitchen oxycodone (OXY-IR) 5 MG capsule Take 5 mg by mouth every 4 (four) hours as needed.        Marland Kitchen spironolactone (ALDACTONE) 25 MG tablet Take 25 mg by mouth daily.        Marland Kitchen warfarin (COUMADIN) 5 MG tablet Take 1 tablet (5 mg total) by mouth as directed.  105 tablet  1  . lisinopril (PRINIVIL,ZESTRIL) 5 MG tablet Take 1 tablet (5 mg total) by mouth daily.  30 tablet  6    BP 116/52  Pulse 61  Ht 5\' 8"  (1.727 m)  Wt 223 lb (101.152 kg)  BMI 33.91 kg/m2 General: obese,  NAD Neck: JVP 7-8 cm, no thyromegaly or thyroid nodule.  Lungs: Slight crackles at bases bilaterally. CV: Nondisplaced PMI.  Heart irregular S1/S2, no S3/S4, 1/6 SEM.  1+ ankle edema.  Unable to palpate pedal pulses.  Bilateral carotid bruits. Abdomen: Soft, nontender, no hepatosplenomegaly, no distention.  Neurologic: Alert and oriented x 3.  Psych: Normal affect. Extremities: No clubbing or cyanosis.

## 2011-02-15 NOTE — Assessment & Plan Note (Signed)
Status post redo AVR with bioprosthetic valve.  Will get echo for baseline assessment of valve function.

## 2011-02-15 NOTE — Assessment & Plan Note (Signed)
Stable with patent grafts on 7/12 cath.  Continue ASA 81, Vytorin.  Will add ACEI (lisinopril 5 mg daily).  Patient not able to tolerate beta blockers due to significant dyspnea with beta blocker use in past.

## 2011-02-15 NOTE — Assessment & Plan Note (Signed)
Goal LDL < 70.  LDL was higher than goal pre-op but he has lost considerable weight and is eating better.  Will repeat in 3-4 months.

## 2011-02-15 NOTE — Assessment & Plan Note (Signed)
Follows closely with Dr. Myra Gianotti.

## 2011-02-15 NOTE — Assessment & Plan Note (Addendum)
Diastolic CHF.  Patient has NYHA class III symptoms but is only mild volume overloaded on exam.  Continue Lasix 80 mg bid.  Needs to continue to work with PT and later cardiac rehab to increase exercise tolerance.  Check BMET/BNP.

## 2011-02-15 NOTE — Assessment & Plan Note (Signed)
Chronic.  LAA thrombus noted on TEE prior to AVR.  Continue coumadin.

## 2011-02-17 ENCOUNTER — Telehealth: Payer: Self-pay | Admitting: Cardiology

## 2011-02-17 NOTE — Telephone Encounter (Signed)
Pt has PT at home and will be going out to have it done, wants to request it to be scheduled at the one at [facility]

## 2011-02-17 NOTE — Telephone Encounter (Signed)
I talked with pt. Pt to begin cardiac rehab at [facility].

## 2011-02-18 ENCOUNTER — Encounter: Payer: Medicare Other | Admitting: Physician Assistant

## 2011-02-20 ENCOUNTER — Ambulatory Visit (INDEPENDENT_AMBULATORY_CARE_PROVIDER_SITE_OTHER): Payer: Medicare Other | Admitting: Internal Medicine

## 2011-02-20 ENCOUNTER — Encounter: Payer: Self-pay | Admitting: Internal Medicine

## 2011-02-20 ENCOUNTER — Other Ambulatory Visit (INDEPENDENT_AMBULATORY_CARE_PROVIDER_SITE_OTHER): Payer: Medicare Other | Admitting: *Deleted

## 2011-02-20 ENCOUNTER — Ambulatory Visit (INDEPENDENT_AMBULATORY_CARE_PROVIDER_SITE_OTHER): Payer: Self-pay | Admitting: Cardiovascular Disease

## 2011-02-20 ENCOUNTER — Ambulatory Visit (HOSPITAL_COMMUNITY): Payer: Medicare Other | Attending: Internal Medicine | Admitting: Radiology

## 2011-02-20 DIAGNOSIS — I359 Nonrheumatic aortic valve disorder, unspecified: Secondary | ICD-10-CM

## 2011-02-20 DIAGNOSIS — E119 Type 2 diabetes mellitus without complications: Secondary | ICD-10-CM

## 2011-02-20 DIAGNOSIS — R0989 Other specified symptoms and signs involving the circulatory and respiratory systems: Secondary | ICD-10-CM

## 2011-02-20 DIAGNOSIS — I079 Rheumatic tricuspid valve disease, unspecified: Secondary | ICD-10-CM | POA: Insufficient documentation

## 2011-02-20 DIAGNOSIS — I080 Rheumatic disorders of both mitral and aortic valves: Secondary | ICD-10-CM | POA: Insufficient documentation

## 2011-02-20 DIAGNOSIS — I10 Essential (primary) hypertension: Secondary | ICD-10-CM | POA: Insufficient documentation

## 2011-02-20 DIAGNOSIS — I25810 Atherosclerosis of coronary artery bypass graft(s) without angina pectoris: Secondary | ICD-10-CM

## 2011-02-20 DIAGNOSIS — E785 Hyperlipidemia, unspecified: Secondary | ICD-10-CM | POA: Insufficient documentation

## 2011-02-20 DIAGNOSIS — I5032 Chronic diastolic (congestive) heart failure: Secondary | ICD-10-CM

## 2011-02-20 DIAGNOSIS — I4891 Unspecified atrial fibrillation: Secondary | ICD-10-CM

## 2011-02-20 LAB — CBC WITH DIFFERENTIAL/PLATELET
Basophils Absolute: 0 10*3/uL (ref 0.0–0.1)
Basophils Relative: 0.4 % (ref 0.0–3.0)
Eosinophils Absolute: 0.3 10*3/uL (ref 0.0–0.7)
HCT: 36.9 % — ABNORMAL LOW (ref 39.0–52.0)
Hemoglobin: 11.7 g/dL — ABNORMAL LOW (ref 13.0–17.0)
Lymphocytes Relative: 20.3 % (ref 12.0–46.0)
Lymphs Abs: 2 10*3/uL (ref 0.7–4.0)
MCHC: 31.8 g/dL (ref 30.0–36.0)
Neutro Abs: 6.7 10*3/uL (ref 1.4–7.7)
RBC: 4.37 Mil/uL (ref 4.22–5.81)
RDW: 17.6 % — ABNORMAL HIGH (ref 11.5–14.6)

## 2011-02-20 NOTE — Assessment & Plan Note (Signed)
Check a CBC, Hg low post op, anticipate it will be better

## 2011-02-20 NOTE — Assessment & Plan Note (Signed)
To see Dr Talmage Nap soon,  labs

## 2011-02-20 NOTE — Progress Notes (Signed)
  Subjective:    Patient ID: [name], male    DOB: 08-Apr-1935, 76 y.o.   MRN: [id number]  HPI Regular checkup, since the last office visit he had heart surgery, re-do aortic valve replacement, coronary arteries were okay. Recent labs reviewed   Past Medical History  Diagnosis Date  . Erectile dysfunction   . Obesity   . OSA (obstructive sleep apnea)     not using CPAP  . History of nephrolithiasis   . DM type 2 (diabetes mellitus, type 2)     used to see Dr Roanna Raider, now f/u by Dr Drue Novel  . HTN (hypertension)   . Aortal stenosis 2006    biophrostetic  . Atrial fibrillation     onset after CABG--- [facility]  . S/P aortic valve replacement   . CHF (congestive heart failure)     abter CABG  . Pacemaker 2/07    VVI  . PVD (peripheral vascular disease)     CT angio showed- vascular insuff, intestine and RAS bilaterally, andgiogram 02/2009 severe PVD medical management  . Nephrolithiasis   . Skin cancer     surgery (nose) 09-2010  . Sick sinus syndrome     [name]  . Hyperlipidemia   . Arthritis   . Heart murmur   . Ulcer   . Gangrene    Past Surgical History  Procedure Date  . Aortic valve replacement     biophrostetic for Ao Stenosis 2006  . Coronary artery bypass graft 2006  . Tonsillectomy   . Cataract extraction, bilateral   . Amputation 01/2010    great toe  . Pacemaker placement 07/2005    Medtronic  . Aortic valve replacement 01/17/2011    S/P redo median sternotomy, extracorporeal circulation, redo Aortic Valve Replacement using a 23-mm Edwards pericardial Magna-Ease valve, Dr Concepcion Living     Review of Systems Blood sugars are much better since the surgery, around 130 or less. Blood pressure well controlled according to the patient. When he left the hospital he had lower extremity edema, that is better.     Objective:   Physical Exam  Constitutional: He is oriented to person, place, and time. He appears  well-developed and well-nourished.  Cardiovascular: Normal rate and regular rhythm.   Murmur: systolic murmur noted. Pulmonary/Chest: Effort normal. No respiratory distress. He has no wheezes. He has no rales.  Musculoskeletal: Edema: trace B pretibial edema.  Neurological: He is alert and oriented to person, place, and time.  Psychiatric: He has a normal mood and affect. His behavior is normal. Judgment and thought content normal.          Assessment & Plan:  He will see endocrinology soon and he has regular followups with cardiology. Next visit here  in 6 months. Med list not changed

## 2011-02-20 NOTE — Progress Notes (Signed)
Quick Note:  RN Preliminarily reviewed. Forwarded to MD desktop for review and signature ______ 

## 2011-02-25 ENCOUNTER — Telehealth: Payer: Self-pay | Admitting: *Deleted

## 2011-02-25 ENCOUNTER — Telehealth: Payer: Self-pay | Admitting: Cardiology

## 2011-02-25 NOTE — Telephone Encounter (Signed)
Pt calling wanting to see if Dr. Shirlee Latch can call in a sleep aide for pt. Pt said he has not slept for 4 or 5 days now, only a couple hours at a time. Please call pt to advise.

## 2011-02-25 NOTE — Telephone Encounter (Signed)
Pt states he has had problems sleeping the last 4-5 days. Pt states he is not having any chest pain or SOB. Pt will follow-up with his PCP for recommendation for sleep.

## 2011-02-25 NOTE — Telephone Encounter (Signed)
Patient states he had heart Sx [2] weeks ago & has not been able to sleep since Sx; requesting Rx for sleep aid.

## 2011-02-26 MED ORDER — ZOLPIDEM TARTRATE 5 MG PO TABS: 5.0000 mg | ORAL_TABLET | Freq: Every evening | ORAL | Status: DC | PRN

## 2011-02-26 NOTE — Telephone Encounter (Signed)
Ambien 5 mg, try one or two tablets at bedtime as needed for insomnia, #30, no refills. If not effective let me know, otherwise call for a refill

## 2011-02-26 NOTE — Telephone Encounter (Signed)
Patient Informed. Rx sent to pharmacy. 

## 2011-03-07 ENCOUNTER — Encounter: Payer: Self-pay | Admitting: Surgery

## 2011-03-10 ENCOUNTER — Encounter: Payer: Self-pay | Admitting: Surgery

## 2011-03-10 ENCOUNTER — Ambulatory Visit (INDEPENDENT_AMBULATORY_CARE_PROVIDER_SITE_OTHER): Payer: Medicare Other | Admitting: Surgery

## 2011-03-10 ENCOUNTER — Encounter (INDEPENDENT_AMBULATORY_CARE_PROVIDER_SITE_OTHER): Payer: Medicare Other | Admitting: Vascular Surgery

## 2011-03-10 VITALS — BP 115/69 | HR 67 | Resp 16 | Ht 68.0 in | Wt 219.0 lb

## 2011-03-10 DIAGNOSIS — Z48812 Encounter for surgical aftercare following surgery on the circulatory system: Secondary | ICD-10-CM

## 2011-03-10 DIAGNOSIS — I739 Peripheral vascular disease, unspecified: Secondary | ICD-10-CM

## 2011-03-10 NOTE — Progress Notes (Signed)
[facility] [place]   Patient name: [name] MRN: [id number] DOB: [birth year] Sex: male     Chief Complaint  Patient presents with  . Follow-up    6 month stent flu    (Pt is S/P aortic valve revision by Dr. [name] on 01/18/11)    HISTORY OF PRESENT ILLNESS: This is a 76 year old gentleman who returns today for followup. He is status post sub-intimal recanalization of his left anterior tibial artery with balloon angioplasty as well as stenting of his left superficial femoral artery using a Abbott absolute pro 7 x 80 stent. The anterior tibial was angioplasty with a 3 mm balloon. This was done on 02/10/2010. He then underwent left great toe and dictation August 25. His wounds have ultimately heal. He recently underwent aortic valve replacement by Dr. [name] in July. He is doing well today without complaints.  Past Medical History  Diagnosis Date  . Erectile dysfunction   . Obesity   . OSA (obstructive sleep apnea)     not using CPAP  . History of nephrolithiasis   . DM type 2 (diabetes mellitus, type 2)     used to see Dr Roanna Raider, now f/u by Dr Drue Novel  . HTN (hypertension)   . Aortal stenosis 2006    biophrostetic  . Atrial fibrillation     onset after CABG--- [facility]  . S/P aortic valve replacement   . CHF (congestive heart failure)     abter CABG  . Pacemaker 2/07    VVI  . PVD (peripheral vascular disease)     CT angio showed- vascular insuff, intestine and RAS bilaterally, andgiogram 02/2009 severe PVD medical management  . Nephrolithiasis   . Skin cancer     surgery (nose) 09-2010  . Sick sinus syndrome     [name]  . Hyperlipidemia   . Arthritis   . Heart murmur   . Ulcer   . Gangrene     Past Surgical History  Procedure Date  . Aortic valve replacement     biophrostetic for Ao Stenosis 2006  . Coronary artery bypass graft 2006  . Tonsillectomy   . Cataract extraction, bilateral   .  Amputation 01/2010    great toe  . Pacemaker placement 07/2005    Medtronic  . Aortic valve replacement 01/17/2011    S/P redo median sternotomy, extracorporeal circulation, redo Aortic Valve Replacement using a 23-mm Edwards pericardial Magna-Ease valve, Dr Concepcion Living  . Angioplasty / stenting femoral 02/10/10    Left femoral    History   Social History  . Marital Status: Married    Spouse Name: N/A    Number of Children: 2  . Years of Education: N/A   Occupational History  . 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 Smoking status: Former Smoker    Types: Cigarettes    Quit date: 06/23/1988  . Smokeless tobacco: Not on file  . Alcohol Use: No  . Drug Use: No  . Sexually Active: Not on file   Other Topics Concern  . Not on file   Social History Narrative   Diet-- improved ...Marland KitchenMarland KitchenExercise- some, limited by pain 2 step children    Family History  Problem Relation Age of Onset  . Heart attack Father 90  . Stroke Mother 38  . Heart disease Sister 44     also stomach aneurysm  . Cancer Neg Hx  colon, prostate  . Diabetes Sister     and/or heart problems  . Diabetes Sister     and/or heart problems  . Diabetes Sister     and/or heart problems    Allergies as of 03/10/2011 - Review Complete 03/10/2011  Allergen Reaction Noted  . Metoprolol succinate  12/23/2006  . Pioglitazone  07/06/2006  . Potassium-containing compounds  09/26/2010    Current Outpatient Prescriptions on File Prior to Visit  Medication Sig Dispense Refill  . aspirin EC 81 MG EC tablet Take 1 tablet (81 mg total) by mouth daily.    0  . ezetimibe-simvastatin (VYTORIN) 10-40 MG per tablet Take 1 tablet by mouth at bedtime.        . furosemide (LASIX) 80 MG tablet Take 80 mg by mouth 2 (two) times daily.        . insulin glulisine (APIDRA) 100 UNIT/ML injection Takes anywhere from 8 to 14 with some meals      . lisinopril (PRINIVIL,ZESTRIL) 5 MG tablet Take 1  tablet (5 mg total) by mouth daily.  30 tablet  6  . metFORMIN (GLUCOPHAGE-XR) 500 MG 24 hr tablet Take 500 mg by mouth 2 (two) times daily. Take 1 tab in the morning and 1 tablet in the evening.      . metoprolol tartrate (LOPRESSOR) 12.5 mg TABS Take 12.5 mg by mouth 2 (two) times daily.        Marland Kitchen oxycodone (OXY-IR) 5 MG capsule Take 5 mg by mouth every 4 (four) hours as needed.        Marland Kitchen spironolactone (ALDACTONE) 25 MG tablet Take 25 mg by mouth daily.        Marland Kitchen warfarin (COUMADIN) 5 MG tablet Take 1 tablet (5 mg total) by mouth as directed.  105 tablet  1  . zolpidem (AMBIEN) 5 MG tablet Take 1 tablet (5 mg total) by mouth at bedtime as needed for sleep.  30 tablet  0     REVIEW OF SYSTEMS: Positive for fatigue no chest pain minimal shortness of breath  PHYSICAL EXAMINATION: General: The patient appears their stated age.  Vital signs are BP 115/69  Pulse 67  Resp 16  Ht 5\' 8"  (1.727 m)  Wt 219 lb (99.338 kg)  BMI 33.30 kg/m2  SpO2 98% Pulmonary: There is a good air exchange bilaterally without wheezing or rales. Abdomen: Soft and non-tender with normal pitch bowel sounds. Musculoskeletal: There are no major deformities.  There is no significant extremity pain. Neurologic: No focal weakness or paresthesias are detected, Skin: There are no ulcer or rashes noted. There is a eschar at the amputation site was left great toe this is unchanged the wound has healed. Psychiatric: The patient has normal affect. Cardiovascular: There is a regular rate and rhythm without significant murmur appreciated.   Diagnostic Studies Duplex ultrasound was performed today which shows ABI of 0.91 on the left. There are slightly elevated velocities within the superficial femoral artery stent the highest is 206 cm/s waveforms are monophasic.  Assessment: Status post left leg percutaneous revascularization and left great toe amputation Plan: The patient will continue on routine ultrasound surveillance. The  next test will be in 6 months. He'll be scheduled to see myself or the PA in one year  V. Charlena Cross, M.D. [facility] Office: [phone]

## 2011-03-10 NOTE — Procedures (Unsigned)
LOWER EXTREMITY ARTERIAL DUPLEX  INDICATION:  Peripheral vascular disease.  HISTORY: Diabetes:  Yes. Cardiac:  CHF, CABG, and CAD. Hypertension:  Yes. Smoking:  Previous. Previous Surgery:  Left superficial femoral artery stent with PTA of the anterior tibial artery on 02/10/2010; left great toe amputation on 02/14/2010.  SINGLE LEVEL ARTERIAL EXAM                         RIGHT                LEFT Brachial: Anterior tibial: Posterior tibial: Peroneal: Ankle/Brachial Index:  LOWER EXTREMITY ARTERIAL DUPLEX EXAM  DUPLEX: 1. Elevated velocities in the proximal mid superficial femoral artery     stent, mid thigh segment, with a peak systolic velocity of 206     cm/s, which may suggest stenosis. 2. Elevated velocities present in the proximal profunda femoral artery     and mid anterior tibial artery, suggestive of 50% to 75% stenosis.  IMPRESSION: 1. Patent superficial femoral artery stent with elevated velocities     present, as noted above. 2. Native artery stenosis, as noted above. 3. Right ankle brachial index is noncompressible and considered     inaccurate. 4. Left ankle brachial index is 0.91, considered in the mild     claudication range.  ___________________________________________ V. Charlena Cross, MD  SH/MEDQ  D:  03/10/2011  T:  03/10/2011  Job:  [id number]

## 2011-03-10 NOTE — Progress Notes (Signed)
Addended by: Sharee Pimple on: 03/10/2011 04:25 PM   Modules accepted: Orders

## 2011-03-19 ENCOUNTER — Encounter: Payer: Self-pay | Admitting: *Deleted

## 2011-03-19 ENCOUNTER — Ambulatory Visit (INDEPENDENT_AMBULATORY_CARE_PROVIDER_SITE_OTHER): Payer: Medicare Other | Admitting: *Deleted

## 2011-03-19 ENCOUNTER — Ambulatory Visit (INDEPENDENT_AMBULATORY_CARE_PROVIDER_SITE_OTHER): Payer: Medicare Other | Admitting: Cardiology

## 2011-03-19 ENCOUNTER — Encounter: Payer: Self-pay | Admitting: Cardiology

## 2011-03-19 DIAGNOSIS — I509 Heart failure, unspecified: Secondary | ICD-10-CM

## 2011-03-19 DIAGNOSIS — I7389 Other specified peripheral vascular diseases: Secondary | ICD-10-CM

## 2011-03-19 DIAGNOSIS — I359 Nonrheumatic aortic valve disorder, unspecified: Secondary | ICD-10-CM

## 2011-03-19 DIAGNOSIS — I4891 Unspecified atrial fibrillation: Secondary | ICD-10-CM

## 2011-03-19 DIAGNOSIS — Z954 Presence of other heart-valve replacement: Secondary | ICD-10-CM

## 2011-03-19 DIAGNOSIS — Z951 Presence of aortocoronary bypass graft: Secondary | ICD-10-CM

## 2011-03-19 DIAGNOSIS — I059 Rheumatic mitral valve disease, unspecified: Secondary | ICD-10-CM

## 2011-03-19 DIAGNOSIS — I6529 Occlusion and stenosis of unspecified carotid artery: Secondary | ICD-10-CM

## 2011-03-19 DIAGNOSIS — R0602 Shortness of breath: Secondary | ICD-10-CM

## 2011-03-19 DIAGNOSIS — I25810 Atherosclerosis of coronary artery bypass graft(s) without angina pectoris: Secondary | ICD-10-CM

## 2011-03-19 DIAGNOSIS — E785 Hyperlipidemia, unspecified: Secondary | ICD-10-CM

## 2011-03-19 LAB — BASIC METABOLIC PANEL
BUN: 18 mg/dL (ref 6–23)
CO2: 33 mEq/L — ABNORMAL HIGH (ref 19–32)
Calcium: 9.9 mg/dL (ref 8.4–10.5)
Chloride: 96 mEq/L (ref 96–112)
Creatinine, Ser: 0.9 mg/dL (ref 0.4–1.5)

## 2011-03-19 LAB — BRAIN NATRIURETIC PEPTIDE: Pro B Natriuretic peptide (BNP): 126 pg/mL — ABNORMAL HIGH (ref 0.0–100.0)

## 2011-03-19 LAB — POCT INR: INR: 1.9

## 2011-03-19 MED ORDER — OXYCODONE HCL 5 MG PO CAPS: 5.0000 mg | ORAL_CAPSULE | ORAL | Status: DC | PRN

## 2011-03-19 NOTE — Progress Notes (Signed)
PCP: Dr. Drue Novel  76 yo with history of AS s/p redo AVR, diastolic CHF, CAD, chronic atrial fibrillation, s/p pacemaker presents for cardiology followup.  Patient had redo AVR with #23 Edwards pericardial valve on 7/27.  Cath prior to surgery showed stable disease with patent grafts.  Patient has now completed home PT.  He does not want to do cardiac rehab (too far for him to drive).  He is walking daily at home, up and down his driveway.  His breathing is better compared to prior to the operation.  He is able to walk 250-300 feet before he gets short of breath.  No orthopnea or PND.  Weight is up compared to prior appointment.  Mr Stoneking says his appetite has improved considerably recently (lost a lot of weight with surgery).  No chest pain.  No significant peripheral edema.   Labs (1/12): LDL 71, HDL 29, K 4.7, creatinine 1.1 Labs (4/12): K 4, creatinine 1.0, BNP 262 Labs (7/12): LDL 92, HDL 32 Labs (8/12): K 4.3, creatinine 1.15 => 1.1, BNP 174  PMH:  1. Aortic stenosis: s/p bioprosthetic aortic valve replacement in 06/2004.  Echo in 7/11 showed mean gradient 35 mmHg across the bioprosthetic valve.  Echo in 6/12 showed worsening of the bioprosthetic mean gradient to 46 mmHg.  TEE in 7/12 showed only 1 cusp of the bioprosthetic aortic valve moving (heavily calcified), mean 52 mmHg/peak gradient 76 mmHg. Patient had redo AVR (7/12) with #23 Edwards pericardial valve.  2. CAD: CABG with AVR in 1/06.  Patient had SVG-OM and SVG-D.  LHC (7/12) showed 50% distal RCA lesion extending into PLV and PDA, total occlusion CFX, 50% pLAD, 90% ostial D1, patent SVG-D1 and SVG-OM.  3. Diabetes 4. Hyperlipidemia 5. HTN 6. Sick sinus syndrome with Medtronic dual chamber PCM 7. Obesity 8. OSA: Not using CPAP (cannot tolerate) 9. Chronic diastolic CHF: Echo in 7/11 showed EF 60-65% with moderate LV hypertrophy, moderate diastolic dysfunction, mild mitral stenosis, bioprosthetic aortic valve with mean gradient 35 mmHg  (moderate stenosis) and PA systolic pressure 69 mmHg.   Echo (6/12) showed EF 60-65%, moderate LV hypertrophy, bioprosthetic aortic valve with mean gradient 46 mmHg (severe stenosis) and PA systolic pressure 74 mmHg, mild MR.  10. Nephrolithiasis 11. PAD: Has seen McAlhany and Brabham.  Lower extremity angiography in 9/10 showed occlusion of all 3 vessels below the knees in both legs.  Patient then apparently had femoral artery PCI by Dr. Myra Gianotti in 2011.  He had amputation of the left great toe in 8/11.  12. Atrial fibrillation: Chronic, on coumadin.  LAA thrombus noted on TEE in 7/12.  13. Carotid stenosis: carotid dopplers (7/11) with 40-59% bilateral ICA stenosis.  Carotids (6/12) with 40-59% bilateral ICA stenosis.  14. Beta blocker intolerance.   SH: Married, lives in [place].  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   FH: Father with MI at 79, mother with CVA at 55   Current Outpatient Prescriptions  Medication Sig Dispense Refill  . aspirin EC 81 MG EC tablet Take 1 tablet (81 mg total) by mouth daily.    0  . ezetimibe-simvastatin (VYTORIN) 10-40 MG per tablet Take 1 tablet by mouth at bedtime.        . furosemide (LASIX) 80 MG tablet Take 80 mg by mouth 2 (two) times daily.        . insulin glulisine (APIDRA) 100 UNIT/ML injection Takes anywhere  from 8 to 14 with some meals      . lisinopril (PRINIVIL,ZESTRIL) 5 MG tablet Take 1 tablet (5 mg total) by mouth daily.  30 tablet  6  . metFORMIN (GLUCOPHAGE-XR) 500 MG 24 hr tablet Take 1 tab in the morning and 1 tablet in the evening.      . metoprolol tartrate (LOPRESSOR) 12.5 mg TABS Take 12.5 mg by mouth 2 (two) times daily.        Marland Kitchen oxycodone (OXY-IR) 5 MG capsule Take 1 capsule (5 mg total) by mouth every 4 (four) hours as needed.  30 capsule  0  . spironolactone (ALDACTONE) 25 MG tablet Take 25 mg by mouth daily.        Marland Kitchen warfarin (COUMADIN) 5 MG  tablet Take 1 tablet (5 mg total) by mouth as directed.  105 tablet  1  . zolpidem (AMBIEN) 5 MG tablet Take 1 tablet (5 mg total) by mouth at bedtime as needed for sleep.  30 tablet  0    BP 111/62  Pulse 66  Ht 5\' 8"  (1.727 m)  Wt 235 lb (106.595 kg)  BMI 35.73 kg/m2 General: obese, NAD Neck: JVP 7 cm, no thyromegaly or thyroid nodule.  Lungs: Slight crackles at bases bilaterally. CV: Nondisplaced PMI.  Heart irregular S1/S2, no S3/S4, 2/6 early SEM.  Trace ankle edema bilaterally.  Unable to palpate pedal pulses.  Bilateral soft carotid bruits. Abdomen: Soft, nontender, no hepatosplenomegaly, no distention.  Neurologic: Alert and oriented x 3.  Psych: Normal affect. Extremities: No clubbing or cyanosis.

## 2011-03-19 NOTE — Assessment & Plan Note (Signed)
Chronic.  LAA thrombus noted on TEE prior to AVR.  Continue coumadin.  Should be bridged with heparin or Lovenox if he needs to come off coumadin in the future.

## 2011-03-19 NOTE — Assessment & Plan Note (Signed)
Status post redo AVR with bioprosthetic valve.  Well-seated valve on recent echo.

## 2011-03-19 NOTE — Assessment & Plan Note (Signed)
Goal LDL < 70.  Will get lipids with next visit.

## 2011-03-19 NOTE — Patient Instructions (Signed)
Lab today---BMP/BNP 414.05  424.0  I have given you a written prescription for oxycodone 5mg  to take twice a day as needed for severe pain.  Your physician recommends that you schedule a follow-up appointment in: 3 months with Dr Shirlee Latch.

## 2011-03-19 NOTE — Assessment & Plan Note (Signed)
Follows closely with Dr. Brabham.  

## 2011-03-19 NOTE — Assessment & Plan Note (Signed)
Stable with patent grafts on 7/12 cath.  Continue ASA 81, Vytorin, ACEI.  Patient not able to tolerate beta blockers due to significant dyspnea with beta blocker use in past.

## 2011-03-19 NOTE — Assessment & Plan Note (Signed)
Stable moderate disease, repeat in 6/13.

## 2011-03-19 NOTE — Assessment & Plan Note (Signed)
Chronic diastolic CHF.  He is stable volume-wise and improved symptomatically though still NYHA class III.  I have encouraged him to walk more.  I will continue the current dose of Lasix.  BMET/BNP today. Followup in 3 months.

## 2011-03-20 ENCOUNTER — Encounter: Payer: Medicare Other | Admitting: *Deleted

## 2011-04-07 ENCOUNTER — Encounter: Payer: Self-pay | Admitting: Internal Medicine

## 2011-04-07 DIAGNOSIS — I495 Sick sinus syndrome: Secondary | ICD-10-CM

## 2011-04-15 ENCOUNTER — Encounter: Payer: Medicare Other | Admitting: *Deleted

## 2011-04-15 ENCOUNTER — Ambulatory Visit (INDEPENDENT_AMBULATORY_CARE_PROVIDER_SITE_OTHER): Payer: Medicare Other | Admitting: *Deleted

## 2011-04-15 DIAGNOSIS — Z7901 Long term (current) use of anticoagulants: Secondary | ICD-10-CM

## 2011-04-15 DIAGNOSIS — I4891 Unspecified atrial fibrillation: Secondary | ICD-10-CM

## 2011-04-15 DIAGNOSIS — I359 Nonrheumatic aortic valve disorder, unspecified: Secondary | ICD-10-CM

## 2011-04-15 LAB — POCT INR: INR: 1.3

## 2011-04-28 ENCOUNTER — Encounter: Payer: Medicare Other | Admitting: *Deleted

## 2011-05-05 ENCOUNTER — Encounter: Payer: Medicare Other | Admitting: *Deleted

## 2011-05-06 ENCOUNTER — Ambulatory Visit (INDEPENDENT_AMBULATORY_CARE_PROVIDER_SITE_OTHER): Payer: Medicare Other | Admitting: *Deleted

## 2011-05-06 ENCOUNTER — Ambulatory Visit (INDEPENDENT_AMBULATORY_CARE_PROVIDER_SITE_OTHER): Payer: Medicare Other | Admitting: Physician Assistant

## 2011-05-06 ENCOUNTER — Encounter: Payer: Self-pay | Admitting: Physician Assistant

## 2011-05-06 VITALS — BP 136/64 | HR 84 | Ht 68.0 in | Wt 236.0 lb

## 2011-05-06 DIAGNOSIS — M79609 Pain in unspecified limb: Secondary | ICD-10-CM

## 2011-05-06 DIAGNOSIS — Z7901 Long term (current) use of anticoagulants: Secondary | ICD-10-CM

## 2011-05-06 DIAGNOSIS — I2510 Atherosclerotic heart disease of native coronary artery without angina pectoris: Secondary | ICD-10-CM

## 2011-05-06 DIAGNOSIS — I509 Heart failure, unspecified: Secondary | ICD-10-CM

## 2011-05-06 DIAGNOSIS — I4891 Unspecified atrial fibrillation: Secondary | ICD-10-CM

## 2011-05-06 DIAGNOSIS — I359 Nonrheumatic aortic valve disorder, unspecified: Secondary | ICD-10-CM

## 2011-05-06 DIAGNOSIS — M79601 Pain in right arm: Secondary | ICD-10-CM | POA: Insufficient documentation

## 2011-05-06 LAB — POCT INR: INR: 1.5

## 2011-05-06 NOTE — Assessment & Plan Note (Signed)
Doing well post re do AVR.  His symptoms are stable and he can follow up with Dr. Marca Ancona next month as scheduled.

## 2011-05-06 NOTE — Assessment & Plan Note (Signed)
He remains on coumadin and is followed in the coumadin clinic.

## 2011-05-06 NOTE — Patient Instructions (Signed)
Your physician recommends that you schedule a follow-up appointment in: 06/12/11 @ 9:30 WITH DR. Shirlee Latch  I WILL CALL LATER TODAY WHEN I FOUND OUT WHAT TIME/DATE YOUR APPT WILL BE WITH DR. PAZ.  NO CHANGES @ THIS TIME
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
## 2011-05-06 NOTE — Progress Notes (Signed)
History of Present Illness: PCP: Dr. Drue Novel  Primary Cardiologist:  Dr. Marca Ancona  Primary Electrophysiologist:  Dr. Hillis Range   [name] is a 76 y.o. male with history of AS s/p redo AVR, diastolic CHF, CAD, chronic atrial fibrillation, s/p pacemaker. Patient had redo AVR with #23 Edwards pericardial valve on 7/27. Cath prior to surgery showed stable disease with patent grafts.    He was last seen by Dr. Marca Ancona 9/12 and plan was to see him again in 3 mos.  He presents today with complaints of right arm pain.  He notes this has been going on for over a year.  He actually notes numbness and tingling.  He seems to point to the C6-7 area mainly but has a hard time pinpointing where his symptoms are.  He had some pain last week in his shoulder and took oxycodone.  The patient denies chest pain, significant shortness of breath, syncope, orthopnea, PND.  Ankle edema is chronic and unchanged.  He notes that these symptoms pre-dated his LHC and redo AVR.  They have not changed.  They are not his anginal equivalent.    Labs (1/12): LDL 71, HDL 29, K 4.7, creatinine 1.1  Labs (4/12): K 4, creatinine 1.0, BNP 262  Labs (7/12): LDL 92, HDL 32  Labs (8/12): K 4.3, creatinine 1.15 => 1.1, BNP 174    Past Medical History: 1. Aortic stenosis: s/p bioprosthetic aortic valve replacement in 06/2004. Echo in 7/11 showed mean gradient 35 mmHg across the bioprosthetic valve. Echo in 6/12 showed worsening of the bioprosthetic mean gradient to 46 mmHg. TEE in 7/12 showed only 1 cusp of the bioprosthetic aortic valve moving (heavily calcified), mean 52 mmHg/peak gradient 76 mmHg. Patient had redo AVR (7/12) with #23 Edwards pericardial valve.  Echo 02/20/11: EF 55%, inf and IS HK, AVR with mean grad 16 and peak 28, mild MR, severe LAE, mod RVE, mild reduced RVSF, PASP 68. 2. CAD: CABG with AVR in 1/06. Patient had SVG-OM and SVG-D. LHC (7/12) showed 50% distal RCA lesion extending into PLV and PDA, total  occlusion CFX, 50% pLAD, 90% ostial D1, patent SVG-D1 and SVG-OM.  3. Diabetes  4. Hyperlipidemia  5. HTN  6. Sick sinus syndrome with Medtronic dual chamber PCM  7. Obesity  8. OSA: Not using CPAP (cannot tolerate)  9. Chronic diastolic CHF: Echo in 7/11 showed EF 60-65% with moderate LV hypertrophy, moderate diastolic dysfunction, mild mitral stenosis, bioprosthetic aortic valve with mean gradient 35 mmHg (moderate stenosis) and PA systolic pressure 69 mmHg. Echo (6/12) showed EF 60-65%, moderate LV hypertrophy, bioprosthetic aortic valve with mean gradient 46 mmHg (severe stenosis) and PA systolic pressure 74 mmHg, mild MR.  10. Nephrolithiasis  11. PAD: Has seen McAlhany and Brabham. Lower extremity angiography in 9/10 showed occlusion of all 3 vessels below the knees in both legs. Patient then apparently had femoral artery PCI by Dr. Myra Gianotti in 2011. He had amputation of the left great toe in 8/11.  12. Atrial fibrillation: Chronic, on coumadin. LAA thrombus noted on TEE in 7/12.  13. Carotid stenosis: carotid dopplers (7/11) with 40-59% bilateral ICA stenosis. Carotids (6/12) with 40-59% bilateral ICA stenosis.  14. Beta blocker intolerance.    Current Outpatient Prescriptions  Medication Sig Dispense Refill  . aspirin EC 81 MG EC tablet Take 1 tablet (81 mg total) by mouth daily.    0  . ezetimibe-simvastatin (VYTORIN) 10-40 MG per tablet Take 1 tablet by mouth at bedtime.        Marland Kitchen  furosemide (LASIX) 80 MG tablet Take 160 mg by mouth daily.       . insulin glargine (LANTUS) 100 UNIT/ML injection Inject 20 Units into the skin daily.        . insulin glulisine (APIDRA) 100 UNIT/ML injection Takes anywhere from 8 to 14 with some meals      . lisinopril (PRINIVIL,ZESTRIL) 5 MG tablet Take 1 tablet (5 mg total) by mouth daily.  30 tablet  6  . metFORMIN (GLUCOPHAGE-XR) 500 MG 24 hr tablet Take 1 tab in the morning and 1 tablet in the evening.      . metoprolol tartrate (LOPRESSOR) 12.5 mg  TABS Take 12.5 mg by mouth 2 (two) times daily.        Marland Kitchen oxycodone (OXY-IR) 5 MG capsule Take 1 capsule (5 mg total) by mouth every 4 (four) hours as needed.  30 capsule  0  . spironolactone (ALDACTONE) 25 MG tablet Take 25 mg by mouth daily.        Marland Kitchen warfarin (COUMADIN) 5 MG tablet Take 1 tablet (5 mg total) by mouth as directed.  105 tablet  1    Allergies: Allergies  Allergen Reactions  . Metoprolol Succinate   . Pioglitazone     REACTION: edema  . Potassium-Containing Compounds     IV--loss of conciousness    History  Substance Use Topics  . Smoking status: Former Smoker    Types: Cigarettes    Quit date: 06/23/1988  . Smokeless tobacco: Not on file  . Alcohol Use: No     Vital Signs: BP 136/64  Pulse 84  Ht 5\' 8"  (1.727 m)  Wt 236 lb (107.049 kg)  BMI 35.88 kg/m2  PHYSICAL EXAM: Well nourished, well developed, in no acute distress HEENT: normal Neck: no JVD Cardiac:  normal S1, S2; RRR; 2/6 systolic murmur at RUSB Lungs:  clear to auscultation bilaterally, no wheezing, rhonchi or rales Abd: soft, nontender, no hepatomegaly Ext: trace bilat ankle edema MSK: no cervical spinal tenderness; BUE strength is normal and equal,  Skin: warm and dry Neuro:  CNs 2-12 intact, no focal abnormalities noted; I cannot elicit biceps or brachioradialis DTRs bilat  EKG:  V paced, HR 84  ASSESSMENT AND PLAN:

## 2011-05-06 NOTE — Assessment & Plan Note (Signed)
Volume stable.  Follow up with Dr. Marca Ancona next month as scheduled.

## 2011-05-08 ENCOUNTER — Encounter: Payer: Self-pay | Admitting: Internal Medicine

## 2011-05-08 ENCOUNTER — Ambulatory Visit (INDEPENDENT_AMBULATORY_CARE_PROVIDER_SITE_OTHER): Payer: Medicare Other | Admitting: Internal Medicine

## 2011-05-08 VITALS — BP 126/52 | HR 67 | Temp 97.5°F | Wt 235.4 lb

## 2011-05-08 DIAGNOSIS — R209 Unspecified disturbances of skin sensation: Secondary | ICD-10-CM

## 2011-05-08 DIAGNOSIS — R202 Paresthesia of skin: Secondary | ICD-10-CM

## 2011-05-08 NOTE — Assessment & Plan Note (Signed)
One-year history of paresthesia in the right arm, previously I recommended neurology evaluation however he has concomitant shoulder pain, most likely has a orthopedic issue.  he may need a nerve conduction study. We will refer him to [facility] orthopedics, they know the patient already
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
## 2011-05-19 ENCOUNTER — Encounter: Payer: Medicare Other | Admitting: *Deleted

## 2011-05-22 ENCOUNTER — Ambulatory Visit (INDEPENDENT_AMBULATORY_CARE_PROVIDER_SITE_OTHER): Payer: Medicare Other | Admitting: *Deleted

## 2011-05-22 ENCOUNTER — Encounter: Payer: Medicare Other | Admitting: *Deleted

## 2011-05-22 DIAGNOSIS — Z7901 Long term (current) use of anticoagulants: Secondary | ICD-10-CM

## 2011-05-22 DIAGNOSIS — I4891 Unspecified atrial fibrillation: Secondary | ICD-10-CM

## 2011-05-22 DIAGNOSIS — I359 Nonrheumatic aortic valve disorder, unspecified: Secondary | ICD-10-CM

## 2011-05-23 ENCOUNTER — Other Ambulatory Visit: Payer: Self-pay | Admitting: Internal Medicine

## 2011-05-23 ENCOUNTER — Encounter: Payer: Medicare Other | Admitting: *Deleted

## 2011-06-10 ENCOUNTER — Encounter: Payer: Medicare Other | Admitting: Internal Medicine

## 2011-06-12 ENCOUNTER — Encounter: Payer: Self-pay | Admitting: Cardiology

## 2011-06-12 ENCOUNTER — Ambulatory Visit (INDEPENDENT_AMBULATORY_CARE_PROVIDER_SITE_OTHER): Payer: Medicare Other | Admitting: *Deleted

## 2011-06-12 ENCOUNTER — Ambulatory Visit (INDEPENDENT_AMBULATORY_CARE_PROVIDER_SITE_OTHER): Payer: Medicare Other | Admitting: Cardiology

## 2011-06-12 ENCOUNTER — Telehealth: Payer: Self-pay | Admitting: Cardiology

## 2011-06-12 DIAGNOSIS — I4891 Unspecified atrial fibrillation: Secondary | ICD-10-CM

## 2011-06-12 DIAGNOSIS — I359 Nonrheumatic aortic valve disorder, unspecified: Secondary | ICD-10-CM

## 2011-06-12 DIAGNOSIS — I509 Heart failure, unspecified: Secondary | ICD-10-CM

## 2011-06-12 DIAGNOSIS — I5032 Chronic diastolic (congestive) heart failure: Secondary | ICD-10-CM

## 2011-06-12 DIAGNOSIS — Z954 Presence of other heart-valve replacement: Secondary | ICD-10-CM

## 2011-06-12 DIAGNOSIS — I7389 Other specified peripheral vascular diseases: Secondary | ICD-10-CM

## 2011-06-12 DIAGNOSIS — R0602 Shortness of breath: Secondary | ICD-10-CM

## 2011-06-12 DIAGNOSIS — I6529 Occlusion and stenosis of unspecified carotid artery: Secondary | ICD-10-CM

## 2011-06-12 DIAGNOSIS — E785 Hyperlipidemia, unspecified: Secondary | ICD-10-CM

## 2011-06-12 DIAGNOSIS — Z7901 Long term (current) use of anticoagulants: Secondary | ICD-10-CM

## 2011-06-12 DIAGNOSIS — I25810 Atherosclerosis of coronary artery bypass graft(s) without angina pectoris: Secondary | ICD-10-CM

## 2011-06-12 LAB — BASIC METABOLIC PANEL
Calcium: 9.6 mg/dL (ref 8.4–10.5)
Creatinine, Ser: 1 mg/dL (ref 0.4–1.5)
GFR: 77.63 mL/min (ref >60.00)
Glucose, Bld: 129 mg/dL — ABNORMAL HIGH (ref 70–99)
Sodium: 140 mEq/L (ref 135–145)

## 2011-06-12 LAB — POCT INR: INR: 1.6

## 2011-06-12 LAB — LIPID PANEL
Cholesterol: 137 mg/dL (ref 0–200)
HDL: 37.9 mg/dL — ABNORMAL LOW (ref >39.00)
LDL Cholesterol: 87 mg/dL (ref 0–99)
Total CHOL/HDL Ratio: 4
Triglycerides: 63 mg/dL (ref 0.0–149.0)

## 2011-06-12 NOTE — Progress Notes (Signed)
PCP: Dr. Drue Novel  76 yo with history of AS s/p redo AVR, diastolic CHF, CAD, chronic atrial fibrillation, s/p pacemaker presents for cardiology followup.  Patient had redo AVR with #23 Edwards pericardial valve in 7/12.  Cath prior to surgery showed stable disease with patent grafts.  His breathing is better compared to prior to the operation.  He walks up to 1.5 miles at a time slowly without significant dyspnea.  No orthopnea or PND.  Weight is up 6 lbs compared to prior appointment.  No chest pain.  He has developed some peripheral edema and feels like he is retaining more fluid. He follows with [facility] for [facility].  Echo post-AVR showed well-seated valve with normal prosthetic gradient.   Labs (1/12): LDL 71, HDL 29, K 4.7, creatinine 1.1 Labs (4/12): K 4, creatinine 1.0, BNP 262 Labs (7/12): LDL 92, HDL 32 Labs (8/12): K 4.3, creatinine 1.15 => 1.1, BNP 174 Labs (9/12): K 4, creatinine 0.9, BNP 126  PMH:  1. Aortic stenosis: s/p bioprosthetic aortic valve replacement in 06/2004.  Echo in 7/11 showed mean gradient 35 mmHg across the bioprosthetic valve.  Echo in 6/12 showed worsening of the bioprosthetic mean gradient to 46 mmHg.  TEE in 7/12 showed only 1 cusp of the bioprosthetic aortic valve moving (heavily calcified), mean 52 mmHg/peak gradient 76 mmHg. Patient had redo AVR (7/12) with #23 Edwards pericardial valve.  Echo (8/12) post AVR showed EF 55%, inferior/inferoseptal HK, bioprosthetic aortic valve with mean gradient 16/peak 28, mild MR, moderately dilated RV with mildly decreased RV systolic function, PASP 68 mmHg.  2. CAD: CABG with AVR in 1/06.  Patient had SVG-OM and SVG-D.  LHC (7/12) showed 50% distal RCA lesion extending into PLV and PDA, total occlusion CFX, 50% pLAD, 90% ostial D1, patent SVG-D1 and SVG-OM.  3. Diabetes 4. Hyperlipidemia 5. HTN 6. Sick sinus syndrome with Medtronic dual chamber PCM 7. Obesity 8. OSA: Not using CPAP (cannot tolerate) 9. Chronic diastolic CHF: Echo in 7/11  showed EF 60-65% with moderate LV hypertrophy, moderate diastolic dysfunction, mild mitral stenosis, bioprosthetic aortic valve with mean gradient 35 mmHg (moderate stenosis) and PA systolic pressure 69 mmHg.   Echo (6/12) showed EF 60-65%, moderate LV hypertrophy, bioprosthetic aortic valve with mean gradient 46 mmHg (severe stenosis) and PA systolic pressure 74 mmHg, mild MR.  10. Nephrolithiasis 11. PAD: Has seen McAlhany and Brabham.  Lower extremity angiography in 9/10 showed occlusion of all 3 vessels below the knees in both legs.  Patient then had L SFA stent and PTCA left AT by Dr. Myra Gianotti in 2011.  He had amputation of the left great toe in 8/11.  12. Atrial fibrillation: Chronic, on coumadin.  LAA thrombus noted on TEE in 7/12.  13. Carotid stenosis: carotid dopplers (7/11) with 40-59% bilateral ICA stenosis.  Carotids (6/12) with 40-59% bilateral ICA stenosis.  14. Beta blocker intolerance.   SH: Married, lives in [place].  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   FH: Father with MI at 83, mother with CVA at 56  ROS: All systems reviewed and negative except as per HPI.    Current Outpatient Prescriptions  Medication Sig Dispense Refill  . aspirin EC 81 MG EC tablet Take 1 tablet (81 mg total) by mouth daily.    0  . ezetimibe-simvastatin (VYTORIN) 10-40 MG per tablet Take 1 tablet by mouth at bedtime.        Marland Kitchen  insulin glargine (LANTUS) 100 UNIT/ML injection Inject 20 Units into the skin daily.        . insulin glulisine (APIDRA) 100 UNIT/ML injection Takes anywhere from 8 to 14 with some meals      . lisinopril (PRINIVIL,ZESTRIL) 5 MG tablet Take 1 tablet (5 mg total) by mouth daily.  30 tablet  6  . metFORMIN (GLUCOPHAGE-XR) 500 MG 24 hr tablet take 1 tablet by mouth every morning and every evening  60 tablet  2  . metoprolol tartrate (LOPRESSOR) 12.5 mg TABS Take 12.5 mg by mouth 2 (two) times  daily.        Marland Kitchen oxycodone (OXY-IR) 5 MG capsule Take 1 capsule (5 mg total) by mouth every 4 (four) hours as needed.  30 capsule  0  . spironolactone (ALDACTONE) 25 MG tablet Take 25 mg by mouth daily.        Marland Kitchen warfarin (COUMADIN) 5 MG tablet Take 1 tablet (5 mg total) by mouth as directed.  105 tablet  1  . DISCONTD: furosemide (LASIX) 80 MG tablet Take 160 mg by mouth daily.       . furosemide (LASIX) 80 MG tablet Take 2 in the morning and 1 every other afternoon at 4PM        BP 126/62  Pulse 72  Ht 5\' 8"  (1.727 m)  Wt 109.317 kg (241 lb)  BMI 36.64 kg/m2 General: obese, NAD Neck: JVP 10-12 cm, no thyromegaly or thyroid nodule.  Lungs: Slight crackles at bases bilaterally. CV: Nondisplaced PMI.  Heart irregular S1/S2, no S3/S4, 2/6 early SEM.  1+ edema 1/2 up lower legs bilaterally.  Unable to palpate pedal pulses.  Bilateral soft carotid bruits. Abdomen: Soft, nontender, no hepatosplenomegaly, no distention.  Neurologic: Alert and oriented x 3.  Psych: Normal affect. Extremities: No clubbing or cyanosis.

## 2011-06-12 NOTE — Assessment & Plan Note (Signed)
Re-do AVR in 7/12 with bioprosthetic valve.  The valve appeared well-seated on 8/12 echo.  Gradient was not significantly elevated.

## 2011-06-12 NOTE — Telephone Encounter (Signed)
Pt confirmed his furosemide is an 80mg  tablet

## 2011-06-12 NOTE — Assessment & Plan Note (Signed)
Check lipids, goal LDL < 70.  

## 2011-06-12 NOTE — Assessment & Plan Note (Signed)
Will check carotids in 6/13.

## 2011-06-12 NOTE — Telephone Encounter (Signed)
Pt rtn your call

## 2011-06-12 NOTE — Assessment & Plan Note (Signed)
Followed at [facility].  Seen there regularly.

## 2011-06-12 NOTE — Assessment & Plan Note (Signed)
Chronic diastolic CHF, last EF 55%.  NYHA II symptoms, improved since AVR.  However, he appears more volume overloaded on exam.  He is taking Lasix 160 qam.  I am going to have him add a 4 pm 80 mg Lasix dose to be taken every other day.  Watch salt intake.  BMET/BNP in 2 wks.  I will see him back to reassess in 1 month.

## 2011-06-12 NOTE — Patient Instructions (Addendum)
Increase Lasix (furosemide) to 160mg  in the morning and 80mg  at 4pm every other afternoon.  Your physician recommends that you have a FASTING lipid profile/BMET/BNP today 424.1 428.32 414.05  Your physician recommends that you return for lab work on [place] [address]--BMET/BNP 424.1  428.32 414.05  Your physician recommends that you schedule a follow-up appointment in: 1 month with Dr Shirlee Latch.

## 2011-06-12 NOTE — Assessment & Plan Note (Signed)
Chronic.  LAA thrombus noted on TEE prior to AVR.  Continue coumadin.  Should be bridged with heparin or Lovenox if he needs to come off coumadin in the future.  

## 2011-06-15 ENCOUNTER — Telehealth: Payer: Self-pay | Admitting: Internal Medicine

## 2011-06-15 DIAGNOSIS — E041 Nontoxic single thyroid nodule: Secondary | ICD-10-CM

## 2011-06-15 NOTE — Telephone Encounter (Signed)
i just entered a order for a u/s , please arrange

## 2011-06-18 NOTE — Telephone Encounter (Signed)
Order pending     KP

## 2011-06-19 ENCOUNTER — Encounter: Payer: Medicare Other | Admitting: Internal Medicine

## 2011-06-20 ENCOUNTER — Other Ambulatory Visit: Payer: Medicare Other

## 2011-06-23 ENCOUNTER — Ambulatory Visit
Admission: RE | Admit: 2011-06-23 | Discharge: 2011-06-23 | Disposition: A | Discharge status: Home / Self Care | Payer: Medicare Other | Source: Ambulatory Visit | Attending: Internal Medicine | Admitting: Internal Medicine

## 2011-06-23 DIAGNOSIS — E041 Nontoxic single thyroid nodule: Secondary | ICD-10-CM

## 2011-06-26 ENCOUNTER — Other Ambulatory Visit (INDEPENDENT_AMBULATORY_CARE_PROVIDER_SITE_OTHER): Payer: Medicare Other | Admitting: *Deleted

## 2011-06-26 ENCOUNTER — Ambulatory Visit (INDEPENDENT_AMBULATORY_CARE_PROVIDER_SITE_OTHER): Payer: Medicare Other | Admitting: *Deleted

## 2011-06-26 DIAGNOSIS — R0602 Shortness of breath: Secondary | ICD-10-CM | POA: Diagnosis not present

## 2011-06-26 DIAGNOSIS — I5032 Chronic diastolic (congestive) heart failure: Secondary | ICD-10-CM | POA: Diagnosis not present

## 2011-06-26 DIAGNOSIS — I25810 Atherosclerosis of coronary artery bypass graft(s) without angina pectoris: Secondary | ICD-10-CM | POA: Diagnosis not present

## 2011-06-26 DIAGNOSIS — I359 Nonrheumatic aortic valve disorder, unspecified: Secondary | ICD-10-CM

## 2011-06-26 DIAGNOSIS — I4891 Unspecified atrial fibrillation: Secondary | ICD-10-CM

## 2011-06-26 DIAGNOSIS — Z7901 Long term (current) use of anticoagulants: Secondary | ICD-10-CM

## 2011-06-26 DIAGNOSIS — I509 Heart failure, unspecified: Secondary | ICD-10-CM | POA: Diagnosis not present

## 2011-06-26 LAB — BASIC METABOLIC PANEL
CO2: 34 mEq/L — ABNORMAL HIGH (ref 19–32)
Chloride: 101 mEq/L (ref 96–112)
Glucose, Bld: 76 mg/dL (ref 70–99)
Sodium: 141 mEq/L (ref 135–145)

## 2011-06-26 MED ORDER — WARFARIN SODIUM 5 MG PO TABS: 5.0000 mg | ORAL_TABLET | ORAL | Status: DC

## 2011-07-01 ENCOUNTER — Telehealth: Payer: Self-pay | Admitting: *Deleted

## 2011-07-01 DIAGNOSIS — I5032 Chronic diastolic (congestive) heart failure: Secondary | ICD-10-CM

## 2011-07-01 DIAGNOSIS — I25810 Atherosclerosis of coronary artery bypass graft(s) without angina pectoris: Secondary | ICD-10-CM

## 2011-07-01 NOTE — Telephone Encounter (Signed)
Notes Recorded by Jacqlyn Krauss, RN on 07/01/2011 at 9:25 AM I discussed with pt's wife. BMET appt made for 07/15/11. Notes Recorded by Jacqlyn Krauss, RN on 06/30/2011 at 3:11 PM Pt does not have appt with Dr Shirlee Latch 06/30/11 Notes Recorded by Jacqlyn Krauss, RN on 06/30/2011 at 8:25 AM appt 06/30/11 with Dr Shirlee Latch Notes Recorded by Marca Ancona, MD on 06/27/2011 at 3:39 PM Creatinine is a little higher. Repeat BMET in about 2 wks to make sure it does not trend higher.

## 2011-07-11 ENCOUNTER — Other Ambulatory Visit: Payer: Self-pay | Admitting: *Deleted

## 2011-07-11 MED ORDER — FUROSEMIDE 80 MG PO TABS: ORAL_TABLET | ORAL | Status: DC

## 2011-07-11 MED ORDER — SPIRONOLACTONE 25 MG PO TABS: 25.0000 mg | ORAL_TABLET | Freq: Every day | ORAL | Status: DC

## 2011-07-15 ENCOUNTER — Other Ambulatory Visit: Payer: Medicare Other | Admitting: *Deleted

## 2011-07-15 ENCOUNTER — Encounter: Payer: Medicare Other | Admitting: *Deleted

## 2011-07-21 ENCOUNTER — Other Ambulatory Visit: Payer: Medicare Other | Admitting: *Deleted

## 2011-07-21 ENCOUNTER — Encounter: Payer: Self-pay | Admitting: Cardiology

## 2011-07-21 ENCOUNTER — Ambulatory Visit (INDEPENDENT_AMBULATORY_CARE_PROVIDER_SITE_OTHER): Payer: Medicare Other | Admitting: Cardiology

## 2011-07-21 ENCOUNTER — Ambulatory Visit (INDEPENDENT_AMBULATORY_CARE_PROVIDER_SITE_OTHER): Payer: Medicare Other | Admitting: Pharmacist

## 2011-07-21 DIAGNOSIS — I4891 Unspecified atrial fibrillation: Secondary | ICD-10-CM

## 2011-07-21 DIAGNOSIS — Z954 Presence of other heart-valve replacement: Secondary | ICD-10-CM

## 2011-07-21 DIAGNOSIS — R0602 Shortness of breath: Secondary | ICD-10-CM

## 2011-07-21 DIAGNOSIS — I6529 Occlusion and stenosis of unspecified carotid artery: Secondary | ICD-10-CM

## 2011-07-21 DIAGNOSIS — I509 Heart failure, unspecified: Secondary | ICD-10-CM

## 2011-07-21 DIAGNOSIS — Z7901 Long term (current) use of anticoagulants: Secondary | ICD-10-CM | POA: Diagnosis not present

## 2011-07-21 DIAGNOSIS — I359 Nonrheumatic aortic valve disorder, unspecified: Secondary | ICD-10-CM

## 2011-07-21 DIAGNOSIS — I739 Peripheral vascular disease, unspecified: Secondary | ICD-10-CM

## 2011-07-21 DIAGNOSIS — Z951 Presence of aortocoronary bypass graft: Secondary | ICD-10-CM

## 2011-07-21 LAB — BASIC METABOLIC PANEL
BUN: 22 mg/dL (ref 6–23)
CO2: 32 mEq/L (ref 19–32)
Chloride: 100 mEq/L (ref 96–112)
Glucose, Bld: 93 mg/dL (ref 70–99)
Potassium: 4.1 mEq/L (ref 3.5–5.1)
Sodium: 139 mEq/L (ref 135–145)

## 2011-07-21 NOTE — Progress Notes (Signed)
PCP: Dr. Drue Novel  77 yo with history of AS s/p redo AVR, diastolic CHF, CAD, chronic atrial fibrillation, s/p pacemaker presents for cardiology followup.  Patient had redo AVR with #23 Edwards pericardial valve in 7/12.  Cath prior to surgery showed stable disease with patent grafts.  His breathing is better compared to prior to the operation.  At last appointment, he was feeling bloated and appeared volume overloaded.  I added 80 mg Lasix to be taken every other day in the afternoon in addition to his am Lasix dose.  He walks up to 1.5 miles at a time slowly without significant dyspnea.  No orthopnea or PND.  He feels less bloated after increasing Lasix.  No chest pain.  He still have peripheral edema but it is improved.  He follows with [facility] for [facility].    Labs (1/12): LDL 71, HDL 29, K 4.7, creatinine 1.1 Labs (4/12): K 4, creatinine 1.0, BNP 262 Labs (7/12): LDL 92, HDL 32 Labs (8/12): K 4.3, creatinine 1.15 => 1.1, BNP 174 Labs (9/12): K 4, creatinine 0.9, BNP 126 Labs (1/13): BNP 130, K 3.6, creatinine 1.5  PMH:  1. Aortic stenosis: s/p bioprosthetic aortic valve replacement in 06/2004.  Echo in 7/11 showed mean gradient 35 mmHg across the bioprosthetic valve.  Echo in 6/12 showed worsening of the bioprosthetic mean gradient to 46 mmHg.  TEE in 7/12 showed only 1 cusp of the bioprosthetic aortic valve moving (heavily calcified), mean 52 mmHg/peak gradient 76 mmHg. Patient had redo AVR (7/12) with #23 Edwards pericardial valve.  Echo (8/12) post AVR showed EF 55%, inferior/inferoseptal HK, bioprosthetic aortic valve with mean gradient 16/peak 28, mild MR, moderately dilated RV with mildly decreased RV systolic function, PASP 68 mmHg.  2. CAD: CABG with AVR in 1/06.  Patient had SVG-OM and SVG-D.  LHC (7/12) showed 50% distal RCA lesion extending into PLV and PDA, total occlusion CFX, 50% pLAD, 90% ostial D1, patent SVG-D1 and SVG-OM.  3. Diabetes 4. Hyperlipidemia 5. HTN 6. Sick sinus syndrome with  Medtronic dual chamber PCM 7. Obesity 8. OSA: Not using CPAP (cannot tolerate) 9. Chronic diastolic CHF: Echo in 7/11 showed EF 60-65% with moderate LV hypertrophy, moderate diastolic dysfunction, mild mitral stenosis, bioprosthetic aortic valve with mean gradient 35 mmHg (moderate stenosis) and PA systolic pressure 69 mmHg.   Echo (6/12) showed EF 60-65%, moderate LV hypertrophy, bioprosthetic aortic valve with mean gradient 46 mmHg (severe stenosis) and PA systolic pressure 74 mmHg, mild MR.  10. Nephrolithiasis 11. PAD: Has seen McAlhany and Brabham.  Lower extremity angiography in 9/10 showed occlusion of all 3 vessels below the knees in both legs.  Patient then had L SFA stent and PTCA left AT by Dr. Myra Gianotti in 2011.  He had amputation of the left great toe in 8/11.  12. Atrial fibrillation: Chronic, on coumadin.  LAA thrombus noted on TEE in 7/12.  13. Carotid stenosis: carotid dopplers (7/11) with 40-59% bilateral ICA stenosis.  Carotids (6/12) with 40-59% bilateral ICA stenosis.  14. Beta blocker intolerance.   SH: Married, lives in [place].  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   FH: Father with MI at 26, mother with CVA at 28   Current Outpatient Prescriptions  Medication Sig Dispense Refill  . aspirin EC 81 MG EC tablet Take 1 tablet (81 mg total) by mouth daily.    0  . ezetimibe-simvastatin (VYTORIN) 10-40 MG  per tablet Take 1 tablet by mouth at bedtime.        . furosemide (LASIX) 80 MG tablet Take 2 in the morning and 1 every other afternoon at 4PM  270 tablet  1  . insulin glargine (LANTUS) 100 UNIT/ML injection Inject 20 Units into the skin daily.        . insulin glulisine (APIDRA) 100 UNIT/ML injection Takes anywhere from 8 to 14 with some meals      . lisinopril (PRINIVIL,ZESTRIL) 5 MG tablet Take 1 tablet (5 mg total) by mouth daily.  30 tablet  6  . metFORMIN (GLUCOPHAGE-XR) 500 MG 24 hr  tablet take 1 tablet by mouth every morning and every evening  60 tablet  2  . metoprolol tartrate (LOPRESSOR) 12.5 mg TABS Take 12.5 mg by mouth 2 (two) times daily.        Marland Kitchen oxycodone (OXY-IR) 5 MG capsule Take 1 capsule (5 mg total) by mouth every 4 (four) hours as needed.  30 capsule  0  . spironolactone (ALDACTONE) 25 MG tablet Take 1 tablet (25 mg total) by mouth daily.  90 tablet  3  . warfarin (COUMADIN) 5 MG tablet Take 1 tablet (5 mg total) by mouth as directed.  120 tablet  1    BP 126/66  Pulse 80  Ht 5' 5.5" (1.664 m)  Wt 110.678 kg (244 lb)  BMI 39.99 kg/m2 General: obese, NAD Neck: JVP 8 cm, no thyromegaly or thyroid nodule.  Lungs: Slight crackles at bases bilaterally. CV: Nondisplaced PMI.  Heart irregular S1/S2, no S3/S4, 2/6 early SEM.  1+ edema at ankles bilaterally.  Unable to palpate pedal pulses.  Bilateral soft carotid bruits. Abdomen: Soft, nontender, no hepatosplenomegaly, no distention.  Neurologic: Alert and oriented x 3.  Psych: Normal affect. Extremities: No clubbing or cyanosis.

## 2011-07-21 NOTE — Assessment & Plan Note (Signed)
Followed at [facility].  Seen there regularly.  

## 2011-07-21 NOTE — Assessment & Plan Note (Signed)
Stable with patent grafts on 7/12 cath.  Continue ASA 81, Vytorin, ACEI.  Patient not able to tolerate beta blockers due to significant dyspnea with beta blocker use in past.  

## 2011-07-21 NOTE — Patient Instructions (Signed)
Your physician recommends that you have lab work today--BMET/BNP.  Your physician recommends that you schedule a follow-up appointment in: 3 months with Dr McLean.     

## 2011-07-21 NOTE — Assessment & Plan Note (Signed)
Chronic diastolic CHF, last EF 55%.  NYHA II symptoms, improved since AVR.  He is still mildly volume overloaded but improved since I increased the Lasix.  I will get a BMET and BNP today. Last creatinine was elevated from baseline, so given stable symptoms, I am not going to increase Lasix more today. Followup in 3 months.

## 2011-07-21 NOTE — Assessment & Plan Note (Signed)
Will check carotids in 6/13.  

## 2011-07-21 NOTE — Assessment & Plan Note (Signed)
Chronic.  LAA thrombus noted on TEE prior to AVR.  Continue coumadin.  Should be bridged with heparin or Lovenox if he needs to come off coumadin in the future.  

## 2011-07-21 NOTE — Assessment & Plan Note (Signed)
Re-do AVR in 7/12 with bioprosthetic valve.  The valve appeared well-seated on 8/12 echo.  Gradient was not significantly elevated.  

## 2011-08-18 ENCOUNTER — Encounter (HOSPITAL_COMMUNITY): Payer: Self-pay | Admitting: General Practice

## 2011-08-18 ENCOUNTER — Inpatient Hospital Stay (HOSPITAL_COMMUNITY)
Admission: AD | Admit: 2011-08-18 | Discharge: 2011-08-21 | DRG: 291 | Disposition: A | Discharge status: Home / Self Care | Payer: Medicare Other | Source: Ambulatory Visit | Attending: Cardiology | Admitting: Cardiology

## 2011-08-18 ENCOUNTER — Encounter (INDEPENDENT_AMBULATORY_CARE_PROVIDER_SITE_OTHER): Payer: Medicare Other

## 2011-08-18 ENCOUNTER — Encounter: Payer: Self-pay | Admitting: Physician Assistant

## 2011-08-18 ENCOUNTER — Ambulatory Visit (INDEPENDENT_AMBULATORY_CARE_PROVIDER_SITE_OTHER): Payer: Medicare Other | Admitting: Physician Assistant

## 2011-08-18 ENCOUNTER — Telehealth: Payer: Self-pay | Admitting: Cardiology

## 2011-08-18 VITALS — BP 128/58 | HR 66 | Ht 70.0 in | Wt 238.0 lb

## 2011-08-18 DIAGNOSIS — Z951 Presence of aortocoronary bypass graft: Secondary | ICD-10-CM | POA: Diagnosis not present

## 2011-08-18 DIAGNOSIS — I359 Nonrheumatic aortic valve disorder, unspecified: Secondary | ICD-10-CM | POA: Diagnosis not present

## 2011-08-18 DIAGNOSIS — I4891 Unspecified atrial fibrillation: Secondary | ICD-10-CM

## 2011-08-18 DIAGNOSIS — Z952 Presence of prosthetic heart valve: Secondary | ICD-10-CM

## 2011-08-18 DIAGNOSIS — R0600 Dyspnea, unspecified: Secondary | ICD-10-CM

## 2011-08-18 DIAGNOSIS — I5189 Other ill-defined heart diseases: Secondary | ICD-10-CM | POA: Diagnosis present

## 2011-08-18 DIAGNOSIS — E785 Hyperlipidemia, unspecified: Secondary | ICD-10-CM | POA: Diagnosis present

## 2011-08-18 DIAGNOSIS — I25810 Atherosclerosis of coronary artery bypass graft(s) without angina pectoris: Secondary | ICD-10-CM

## 2011-08-18 DIAGNOSIS — R0602 Shortness of breath: Secondary | ICD-10-CM | POA: Diagnosis not present

## 2011-08-18 DIAGNOSIS — I5033 Acute on chronic diastolic (congestive) heart failure: Principal | ICD-10-CM | POA: Insufficient documentation

## 2011-08-18 DIAGNOSIS — G4733 Obstructive sleep apnea (adult) (pediatric): Secondary | ICD-10-CM | POA: Diagnosis present

## 2011-08-18 DIAGNOSIS — I2582 Chronic total occlusion of coronary artery: Secondary | ICD-10-CM | POA: Diagnosis present

## 2011-08-18 DIAGNOSIS — E119 Type 2 diabetes mellitus without complications: Secondary | ICD-10-CM | POA: Diagnosis present

## 2011-08-18 DIAGNOSIS — Z794 Long term (current) use of insulin: Secondary | ICD-10-CM | POA: Diagnosis not present

## 2011-08-18 DIAGNOSIS — I6529 Occlusion and stenosis of unspecified carotid artery: Secondary | ICD-10-CM | POA: Diagnosis present

## 2011-08-18 DIAGNOSIS — I509 Heart failure, unspecified: Secondary | ICD-10-CM | POA: Diagnosis present

## 2011-08-18 DIAGNOSIS — R0989 Other specified symptoms and signs involving the circulatory and respiratory systems: Secondary | ICD-10-CM

## 2011-08-18 DIAGNOSIS — Z950 Presence of cardiac pacemaker: Secondary | ICD-10-CM

## 2011-08-18 DIAGNOSIS — Z954 Presence of other heart-valve replacement: Secondary | ICD-10-CM | POA: Insufficient documentation

## 2011-08-18 DIAGNOSIS — Z79899 Other long term (current) drug therapy: Secondary | ICD-10-CM | POA: Diagnosis not present

## 2011-08-18 DIAGNOSIS — I658 Occlusion and stenosis of other precerebral arteries: Secondary | ICD-10-CM | POA: Diagnosis present

## 2011-08-18 DIAGNOSIS — R1011 Right upper quadrant pain: Secondary | ICD-10-CM | POA: Diagnosis not present

## 2011-08-18 DIAGNOSIS — I2510 Atherosclerotic heart disease of native coronary artery without angina pectoris: Secondary | ICD-10-CM | POA: Insufficient documentation

## 2011-08-18 DIAGNOSIS — I10 Essential (primary) hypertension: Secondary | ICD-10-CM | POA: Diagnosis present

## 2011-08-18 DIAGNOSIS — S98119A Complete traumatic amputation of unspecified great toe, initial encounter: Secondary | ICD-10-CM

## 2011-08-18 DIAGNOSIS — I5032 Chronic diastolic (congestive) heart failure: Secondary | ICD-10-CM

## 2011-08-18 DIAGNOSIS — Z7982 Long term (current) use of aspirin: Secondary | ICD-10-CM

## 2011-08-18 DIAGNOSIS — E669 Obesity, unspecified: Secondary | ICD-10-CM | POA: Diagnosis present

## 2011-08-18 DIAGNOSIS — N200 Calculus of kidney: Secondary | ICD-10-CM | POA: Diagnosis not present

## 2011-08-18 DIAGNOSIS — Z87891 Personal history of nicotine dependence: Secondary | ICD-10-CM

## 2011-08-18 DIAGNOSIS — J90 Pleural effusion, not elsewhere classified: Secondary | ICD-10-CM | POA: Diagnosis not present

## 2011-08-18 DIAGNOSIS — R0609 Other forms of dyspnea: Secondary | ICD-10-CM | POA: Diagnosis not present

## 2011-08-18 DIAGNOSIS — IMO0002 Reserved for concepts with insufficient information to code with codable children: Secondary | ICD-10-CM | POA: Insufficient documentation

## 2011-08-18 DIAGNOSIS — J189 Pneumonia, unspecified organism: Secondary | ICD-10-CM | POA: Diagnosis present

## 2011-08-18 DIAGNOSIS — I482 Chronic atrial fibrillation, unspecified: Secondary | ICD-10-CM

## 2011-08-18 DIAGNOSIS — I739 Peripheral vascular disease, unspecified: Secondary | ICD-10-CM | POA: Diagnosis present

## 2011-08-18 DIAGNOSIS — I059 Rheumatic mitral valve disease, unspecified: Secondary | ICD-10-CM | POA: Diagnosis not present

## 2011-08-18 DIAGNOSIS — I350 Nonrheumatic aortic (valve) stenosis: Secondary | ICD-10-CM

## 2011-08-18 DIAGNOSIS — I517 Cardiomegaly: Secondary | ICD-10-CM | POA: Diagnosis not present

## 2011-08-18 DIAGNOSIS — Z7901 Long term (current) use of anticoagulants: Secondary | ICD-10-CM | POA: Diagnosis not present

## 2011-08-18 DIAGNOSIS — J42 Unspecified chronic bronchitis: Secondary | ICD-10-CM | POA: Diagnosis not present

## 2011-08-18 DIAGNOSIS — E1151 Type 2 diabetes mellitus with diabetic peripheral angiopathy without gangrene: Secondary | ICD-10-CM | POA: Insufficient documentation

## 2011-08-18 HISTORY — DX: Atherosclerotic heart disease of native coronary artery without angina pectoris: I25.10

## 2011-08-18 LAB — CARDIAC PANEL(CRET KIN+CKTOT+MB+TROPI)
CK, MB: 3.8 ng/mL (ref 0.3–4.0)
CK, MB: 3.5 ng/mL (ref 0.3–4.0)
Relative Index: INVALID (ref 0.0–2.5)
Total CK: 66 U/L (ref 7–232)
Total CK: 65 U/L (ref 7–232)

## 2011-08-18 LAB — PROTIME-INR: INR: 2.93 — ABNORMAL HIGH (ref 0.00–1.49)

## 2011-08-18 LAB — CBC
HCT: 41.4 % (ref 39.0–52.0)
Hemoglobin: 13.4 g/dL (ref 13.0–17.0)
MCV: 86.8 fL (ref 78.0–100.0)
RBC: 4.77 MIL/uL (ref 4.22–5.81)
WBC: 15.5 10*3/uL — ABNORMAL HIGH (ref 4.0–10.5)

## 2011-08-18 LAB — COMPREHENSIVE METABOLIC PANEL
ALT: 18 U/L (ref 0–53)
AST: 29 U/L (ref 0–37)
CO2: 32 mEq/L (ref 19–32)
Calcium: 10.2 mg/dL (ref 8.4–10.5)
Chloride: 99 mEq/L (ref 96–112)
Creatinine, Ser: 0.79 mg/dL (ref 0.50–1.35)
GFR calc Af Amer: >90 mL/min (ref >90)
GFR calc non Af Amer: 86 mL/min — ABNORMAL LOW (ref >90)
Glucose, Bld: 145 mg/dL — ABNORMAL HIGH (ref 70–99)
Total Bilirubin: 0.5 mg/dL (ref 0.3–1.2)

## 2011-08-18 LAB — HEMOGLOBIN A1C: Mean Plasma Glucose: 189 mg/dL — ABNORMAL HIGH (ref <117)

## 2011-08-18 LAB — GLUCOSE, CAPILLARY: Glucose-Capillary: 308 mg/dL — ABNORMAL HIGH (ref 70–99)

## 2011-08-18 LAB — DIFFERENTIAL
Eosinophils Relative: 0 % (ref 0–5)
Lymphocytes Relative: 6 % — ABNORMAL LOW (ref 12–46)
Lymphs Abs: 1 10*3/uL (ref 0.7–4.0)
Monocytes Absolute: 1 10*3/uL (ref 0.1–1.0)
Neutro Abs: 13.5 10*3/uL — ABNORMAL HIGH (ref 1.7–7.7)

## 2011-08-18 MED ORDER — ASPIRIN EC 81 MG PO TBEC
81.0000 mg | DELAYED_RELEASE_TABLET | Freq: Every day | ORAL | Status: DC
  Administered 2011-08-18 – 2011-08-21 (×4): 81 mg via ORAL
  Filled 2011-08-18 (×4): qty 1

## 2011-08-18 MED ORDER — MOXIFLOXACIN HCL IN NACL 400 MG/250ML IV SOLN
400.0000 mg | Freq: Every day | INTRAVENOUS | Status: DC
  Administered 2011-08-18 – 2011-08-20 (×3): 400 mg via INTRAVENOUS
  Filled 2011-08-18 (×4): qty 250

## 2011-08-18 MED ORDER — SPIRONOLACTONE 25 MG PO TABS
25.0000 mg | ORAL_TABLET | Freq: Every day | ORAL | Status: DC
  Administered 2011-08-18 – 2011-08-21 (×4): 25 mg via ORAL
  Filled 2011-08-18 (×4): qty 1

## 2011-08-18 MED ORDER — SIMETHICONE 80 MG PO CHEW
80.0000 mg | CHEWABLE_TABLET | Freq: Four times a day (QID) | ORAL | Status: DC | PRN
  Filled 2011-08-18: qty 1

## 2011-08-18 MED ORDER — INSULIN ASPART 100 UNIT/ML ~~LOC~~ SOLN
0.0000 [IU] | Freq: Every day | SUBCUTANEOUS | Status: DC
  Filled 2011-08-18: qty 3

## 2011-08-18 MED ORDER — INSULIN GLARGINE 100 UNIT/ML ~~LOC~~ SOLN: 20.0000 [IU] | Freq: Every day | SUBCUTANEOUS | Status: DC

## 2011-08-18 MED ORDER — ONDANSETRON HCL 4 MG/2ML IJ SOLN: 4.0000 mg | Freq: Four times a day (QID) | INTRAMUSCULAR | Status: DC | PRN

## 2011-08-18 MED ORDER — METOPROLOL TARTRATE 12.5 MG HALF TABLET
12.5000 mg | ORAL_TABLET | Freq: Two times a day (BID) | ORAL | Status: DC
  Administered 2011-08-18: 12.5 mg via ORAL
  Filled 2011-08-18 (×2): qty 1

## 2011-08-18 MED ORDER — SODIUM CHLORIDE 0.9 % IJ SOLN
3.0000 mL | INTRAMUSCULAR | Status: DC | PRN
  Administered 2011-08-20: 3 mL via INTRAVENOUS

## 2011-08-18 MED ORDER — FUROSEMIDE 10 MG/ML IJ SOLN
80.0000 mg | Freq: Two times a day (BID) | INTRAMUSCULAR | Status: DC
  Administered 2011-08-18: 80 mg via INTRAVENOUS
  Filled 2011-08-18 (×4): qty 8

## 2011-08-18 MED ORDER — METFORMIN HCL ER 500 MG PO TB24
500.0000 mg | ORAL_TABLET | Freq: Two times a day (BID) | ORAL | Status: DC
  Administered 2011-08-19 – 2011-08-21 (×5): 500 mg via ORAL
  Filled 2011-08-18 (×7): qty 1

## 2011-08-18 MED ORDER — SODIUM CHLORIDE 0.9 % IJ SOLN
3.0000 mL | Freq: Two times a day (BID) | INTRAMUSCULAR | Status: DC
  Administered 2011-08-18 – 2011-08-21 (×6): 3 mL via INTRAVENOUS

## 2011-08-18 MED ORDER — SODIUM CHLORIDE 0.9 % IV SOLN
250.0000 mL | INTRAVENOUS | Status: DC | PRN
  Administered 2011-08-18: 250 mL via INTRAVENOUS
  Administered 2011-08-20: 1000 mL via INTRAVENOUS

## 2011-08-18 MED ORDER — EZETIMIBE-SIMVASTATIN 10-40 MG PO TABS
1.0000 | ORAL_TABLET | Freq: Every day | ORAL | Status: DC
  Administered 2011-08-18 – 2011-08-20 (×3): 1 via ORAL
  Filled 2011-08-18 (×4): qty 1

## 2011-08-18 MED ORDER — WARFARIN SODIUM 10 MG PO TABS
10.0000 mg | ORAL_TABLET | ORAL | Status: DC
  Administered 2011-08-20: 10 mg via ORAL
  Filled 2011-08-18: qty 1

## 2011-08-18 MED ORDER — WARFARIN SODIUM 5 MG PO TABS
5.0000 mg | ORAL_TABLET | ORAL | Status: DC
  Administered 2011-08-19: 5 mg via ORAL
  Filled 2011-08-18 (×2): qty 1

## 2011-08-18 MED ORDER — OXYCODONE HCL 5 MG PO CAPS: 5.0000 mg | ORAL_CAPSULE | ORAL | Status: DC | PRN

## 2011-08-18 MED ORDER — INSULIN ASPART 100 UNIT/ML ~~LOC~~ SOLN
0.0000 [IU] | Freq: Three times a day (TID) | SUBCUTANEOUS | Status: DC
  Administered 2011-08-18: 15 [IU] via SUBCUTANEOUS
  Administered 2011-08-20 – 2011-08-21 (×2): 3 [IU] via SUBCUTANEOUS
  Filled 2011-08-18: qty 3

## 2011-08-18 MED ORDER — METOPROLOL TARTRATE 12.5 MG HALF TABLET
12.5000 mg | ORAL_TABLET | Freq: Two times a day (BID) | ORAL | Status: DC
  Administered 2011-08-18 – 2011-08-21 (×6): 12.5 mg via ORAL
  Filled 2011-08-18 (×7): qty 1

## 2011-08-18 MED ORDER — WARFARIN SODIUM 5 MG PO TABS: 5.0000 mg | ORAL_TABLET | ORAL | Status: DC

## 2011-08-18 MED ORDER — INSULIN GLARGINE 100 UNIT/ML ~~LOC~~ SOLN
70.0000 [IU] | Freq: Every day | SUBCUTANEOUS | Status: DC
  Administered 2011-08-19: 70 [IU] via SUBCUTANEOUS
  Filled 2011-08-18: qty 3

## 2011-08-18 MED ORDER — ASPIRIN EC 81 MG PO TBEC: 81.0000 mg | DELAYED_RELEASE_TABLET | Freq: Every day | ORAL | Status: DC

## 2011-08-18 MED ORDER — METFORMIN HCL ER 500 MG PO TB24
500.0000 mg | ORAL_TABLET | Freq: Two times a day (BID) | ORAL | Status: DC
  Administered 2011-08-18: 500 mg via ORAL
  Filled 2011-08-18 (×2): qty 1

## 2011-08-18 MED ORDER — POTASSIUM CHLORIDE CRYS ER 20 MEQ PO TBCR
20.0000 meq | EXTENDED_RELEASE_TABLET | Freq: Two times a day (BID) | ORAL | Status: DC
  Administered 2011-08-18 – 2011-08-21 (×7): 20 meq via ORAL
  Filled 2011-08-18 (×8): qty 1

## 2011-08-18 MED ORDER — LISINOPRIL 5 MG PO TABS
5.0000 mg | ORAL_TABLET | Freq: Every day | ORAL | Status: DC
  Administered 2011-08-18 – 2011-08-21 (×4): 5 mg via ORAL
  Filled 2011-08-18 (×4): qty 1

## 2011-08-18 MED ORDER — OXYCODONE HCL 5 MG PO TABS: 5.0000 mg | ORAL_TABLET | ORAL | Status: DC | PRN

## 2011-08-18 MED ORDER — INSULIN GLARGINE 100 UNIT/ML ~~LOC~~ SOLN
20.0000 [IU] | Freq: Every day | SUBCUTANEOUS | Status: DC
  Administered 2011-08-18: 20 [IU] via SUBCUTANEOUS
  Filled 2011-08-18: qty 3

## 2011-08-18 MED ORDER — WARFARIN SODIUM 10 MG PO TABS: 10.0000 mg | ORAL_TABLET | ORAL | Status: DC

## 2011-08-18 MED ORDER — ACETAMINOPHEN 325 MG PO TABS: 650.0000 mg | ORAL_TABLET | ORAL | Status: DC | PRN

## 2011-08-18 NOTE — Assessment & Plan Note (Signed)
Recent echo with well functioning AVR.

## 2011-08-18 NOTE — Consult Note (Signed)
ANTICOAGULATION CONSULT NOTE - Initial Consult  Pharmacy Consult for Coumadin Indication: Bioprosthetic AVR, Afib  Allergies  Allergen Reactions  . Metoprolol Succinate   . Pioglitazone     REACTION: edema  . Potassium-Containing Compounds     IV--loss of conciousness   Patient Measurements: Height: 5\' 8"  (172.7 cm) Weight: 235 lb 10.8 oz (106.9 kg) (scale B) IBW/kg (Calculated) : 68.4   Vital Signs: Temp: 97.5 F (36.4 C) (02/25 1249) Temp src: Oral (02/25 1249) BP: 137/51 mmHg (02/25 1249) Pulse Rate: 47  (02/25 1249)  Labs:  Basename 08/18/11 1352  HGB 13.4  HCT 41.4  PLT 283  APTT 49*  LABPROT 31.0*  INR 2.93*  HEPARINUNFRC --  CREATININE 0.79  CKTOTAL 66  CKMB 3.8  TROPONINI <0.30   Estimated Creatinine Clearance: 96 ml/min (by C-G formula based on Cr of 0.79).  Medical History: Past Medical History  Diagnosis Date  . Erectile dysfunction   . Obesity   . OSA (obstructive sleep apnea)     not using CPAP  . History of nephrolithiasis   . HTN (hypertension)   . Aortal stenosis 2006    biophrostetic  . Atrial fibrillation     onset after CABG--- [facility]  . S/P aortic valve replacement   . CHF (congestive heart failure)     abter CABG  . Pacemaker 2/07    VVI  . PVD (peripheral vascular disease)     CT angio showed- vascular insuff, intestine and RAS bilaterally, andgiogram 02/2009 severe PVD medical management  . Nephrolithiasis   . Skin cancer     surgery (nose) 09-2010  . Sick sinus syndrome     [name]  . Hyperlipidemia   . Arthritis   . Heart murmur   . Ulcer   . Gangrene   . Shortness of breath   . Coronary artery disease   . DM type 2 (diabetes mellitus, type 2)     used to see Dr Roanna Raider, now f/u by Dr Drue Novel   Medications:  Prescriptions prior to admission  Medication Sig Dispense Refill  . aspirin EC 81 MG EC tablet Take 1 tablet (81 mg total) by mouth daily.    0  .  ezetimibe-simvastatin (VYTORIN) 10-40 MG per tablet Take 1 tablet by mouth at bedtime.       . furosemide (LASIX) 80 MG tablet Take 160-240 mg by mouth every morning. 2-3 tablets every morning      . insulin glargine (LANTUS) 100 UNIT/ML injection Inject 20-70 Units into the skin 2 (two) times daily. 20 units in the morning; 70 units at bedtime      . insulin glulisine (APIDRA) 100 UNIT/ML injection Inject 0-14 Units into the skin 3 (three) times daily before meals. Pt uses home sliding scale based on cbg      . lisinopril (PRINIVIL,ZESTRIL) 5 MG tablet Take 1 tablet (5 mg total) by mouth daily.  30 tablet  6  . metFORMIN (GLUCOPHAGE-XR) 500 MG 24 hr tablet Take 500 mg by mouth 2 (two) times daily.      . metoprolol tartrate (LOPRESSOR) 25 MG tablet Take 12.5 mg by mouth 2 (two) times daily.      Marland Kitchen spironolactone (ALDACTONE) 25 MG tablet Take 1 tablet (25 mg total) by mouth daily.  90 tablet  3  . warfarin (COUMADIN) 5 MG tablet Take 5-10 mg by mouth daily. 5mg  tablet Sunday, Monday, Tuesday, Thursday, Saturday; 10mg  Wednesday and Friday  Assessment: 76yom on coumadin pta (5mg  daily except 10mg  on Wed/Fri) for bioprosthetic AVR and chronic afib. INR on admission is therapeutic.  Goal of Therapy:  INR 2.5-3.5   Plan:  1) Continue home coumadin regimen 2) Daily INR  Fredrik Rigger 08/18/2011,3:22 PM

## 2011-08-18 NOTE — Patient Instructions (Signed)
Sending to [id number]

## 2011-08-18 NOTE — Progress Notes (Signed)
[address] [place], [place]  [address] Phone: [phone] Fax:  [phone]  Date:  08/18/2011   Name:  [name]       DOB:  December 01, 1934 MRN:  [id number]  PCP:  Dr. Drue Novel Primary Cardiologist:  Dr. Marca Ancona  Primary Electrophysiologist:  Dr. Hillis Range    History of Present Illness: [name] is a 77 y.o. male who presents for dyspnea.    He has a history of AS s/p redo AVR, diastolic CHF, CAD, chronic atrial fibrillation, s/p pacemaker.  Patient had redo AVR with #23 Edwards pericardial valve in 7/12. Cath prior to surgery showed stable disease with patent grafts.  Last seen by Dr. Marca Ancona 07/21/11.  Weight at that time was 244 pounds.  Lasix dose had been increased prior to that visit.  He notes increased dyspnea and abdominal bloating for the past 1-2 weeks.  States he thinks he has had the flu.  Notes a cough with yellowish sputum.  Thinks he had a fever but did not record it.  He has had chills.  Does not weigh himself.  No chest pain.  No syncope.  Sleeps on one pillow.  Feels like his feet are more swollen.  No PND.    Labs (1/12): LDL 71, HDL 29, K 4.7, creatinine 1.1  Labs (4/12): K 4, creatinine 1.0, BNP 262  Labs (7/12): LDL 92, HDL 32  Labs (8/12): K 4.3, creatinine 1.15 => 1.1, BNP 174  Labs (9/12): K 4, creatinine 0.9, BNP 126  Labs (1/13): BNP 130, K 3.6, creatinine 1.5 ==> K 4.1, creatinine 1.0, BNP 112  Past Medical History: 1. Aortic stenosis: s/p bioprosthetic aortic valve replacement in 06/2004. Echo in 7/11 showed mean gradient 35 mmHg across the bioprosthetic valve. Echo in 6/12 showed worsening of the bioprosthetic mean gradient to 46 mmHg. TEE in 7/12 showed only 1 cusp of the bioprosthetic aortic valve moving (heavily calcified), mean 52 mmHg/peak gradient 76 mmHg. Patient had redo AVR (7/12) with #23 Edwards pericardial valve. Echo (8/12) post AVR showed EF 55%, inferior/inferoseptal HK, bioprosthetic aortic  valve with mean gradient 16/peak 28, mild MR, moderately dilated RV with mildly decreased RV systolic function, PASP 68 mmHg.  2. CAD: CABG with AVR in 1/06. Patient had SVG-OM and SVG-D. LHC (7/12) showed 50% distal RCA lesion extending into PLV and PDA, total occlusion CFX, 50% pLAD, 90% ostial D1, patent SVG-D1 and SVG-OM.  3. Diabetes  4. Hyperlipidemia  5. HTN  6. Sick sinus syndrome with Medtronic dual chamber PCM  7. Obesity  8. OSA: Not using CPAP (cannot tolerate)  9. Chronic diastolic CHF: Echo in 7/11 showed EF 60-65% with moderate LV hypertrophy, moderate diastolic dysfunction, mild mitral stenosis, bioprosthetic aortic valve with mean gradient 35 mmHg (moderate stenosis) and PA systolic pressure 69 mmHg. Echo (6/12) showed EF 60-65%, moderate LV hypertrophy, bioprosthetic aortic valve with mean gradient 46 mmHg (severe stenosis) and PA systolic pressure 74 mmHg, mild MR.  10. Nephrolithiasis  11. PAD: Has seen McAlhany and Brabham. Lower extremity angiography in 9/10 showed occlusion of all 3 vessels below the knees in both legs. Patient then had L SFA stent and PTCA left AT by Dr. Myra Gianotti in 2011. He had amputation of the left great toe in 8/11.  12. Atrial fibrillation: Chronic, on coumadin. LAA thrombus noted on TEE in 7/12.  13. Carotid stenosis: carotid dopplers (7/11) with 40-59% bilateral ICA stenosis. Carotids (6/12) with 40-59% bilateral ICA  stenosis.  14. Beta blocker intolerance.     Current Outpatient Prescriptions  Medication Sig Dispense Refill  . aspirin EC 81 MG EC tablet Take 1 tablet (81 mg total) by mouth daily.    0  . ezetimibe-simvastatin (VYTORIN) 10-40 MG per tablet Take 1 tablet by mouth at bedtime.        . furosemide (LASIX) 80 MG tablet Take 2 in the morning and 1 every other afternoon at 4PM  270 tablet  1  . insulin glargine (LANTUS) 100 UNIT/ML injection Inject 20 Units into the skin daily.        . insulin glulisine (APIDRA) 100 UNIT/ML injection  Takes anywhere from 8 to 14 with some meals      . lisinopril (PRINIVIL,ZESTRIL) 5 MG tablet Take 1 tablet (5 mg total) by mouth daily.  30 tablet  6  . metFORMIN (GLUCOPHAGE-XR) 500 MG 24 hr tablet take 1 tablet by mouth every morning and every evening  60 tablet  2  . metoprolol tartrate (LOPRESSOR) 12.5 mg TABS Take 12.5 mg by mouth 2 (two) times daily.        Marland Kitchen oxycodone (OXY-IR) 5 MG capsule Take 1 capsule (5 mg total) by mouth every 4 (four) hours as needed.  30 capsule  0  . spironolactone (ALDACTONE) 25 MG tablet Take 1 tablet (25 mg total) by mouth daily.  90 tablet  3  . warfarin (COUMADIN) 5 MG tablet Take 1 tablet (5 mg total) by mouth as directed.  120 tablet  1    Allergies: Allergies  Allergen Reactions  . Metoprolol Succinate   . Pioglitazone     REACTION: edema  . Potassium-Containing Compounds     IV--loss of conciousness    History  Substance Use Topics  . Smoking status: Former Smoker    Types: Cigarettes    Quit date: 06/23/1988  . Smokeless tobacco: Not on file  . Alcohol Use: No     Social History:  Married, lives in [place]. 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 Quit smoking in 1990 (smoked heavily prior to this).   Family History: Father with MI at 3, mother with CVA at 52  ROS:  Please see the history of present illness.   Has coughed hard enough to cause him to vomit.  No diarrhea, melena, hematochezia, dysuria.  No hemoptysis.  Does note RUQ pain he attributes to gas and he has had this in the past.  All other systems reviewed and negative.   PHYSICAL EXAM: VS:  BP 128/58  Pulse 66  Ht 5\' 10"  (1.778 m)  Wt 238 lb (107.956 kg)  BMI 34.15 kg/m2  SpO2 87%  Well nourished, well developed male; he is tachypneic  HEENT: normal Neck: + JVD Cardiac:  normal S1, S2; irreg irreg; 2/6 systolic murmur along LSB Lungs:  Bibasilar crackles, no wheezing, no rhonchi Abd: soft, no hepatomegaly, + tenderness with palp of  RUQ Ext: trace bilateral edema Skin: warm and dry Neuro:  CNs 2-12 intact, no focal abnormalities noted Psych: normal affect  EKG:  V paced, HR 66  ASSESSMENT AND PLAN:

## 2011-08-18 NOTE — Telephone Encounter (Signed)
Pt has CHF, having SOB and stomach and feet are swollen, worse last two -three days, pt has coumadin at 12p , wants to see someone while here

## 2011-08-18 NOTE — Assessment & Plan Note (Signed)
Permanent.  He remains on coumadin.

## 2011-08-18 NOTE — Telephone Encounter (Signed)
Per wife, did increase Lasix as directed at last ov.  Can only walk a few steps and is out of breath.  Scheduled appointment with Arline Asp PA this am (ok per Cordelia Pen)

## 2011-08-18 NOTE — Assessment & Plan Note (Signed)
As noted, suspect he may have pneumonia.  Will cover for pneumonia with Avelox and get CXR.  Check Blood cultures and sputum culture if possible.

## 2011-08-18 NOTE — Assessment & Plan Note (Addendum)
No angina.  Check serial markers to rule out ischemic event.

## 2011-08-18 NOTE — Assessment & Plan Note (Signed)
Weights are down by our scales.  But on exam he appears to be volume overloaded.  He feels bloated in his abdomen.  He has bibasilar crackles.  Neck veins are up.  He also has flu like symptoms for the last 1-2 weeks with possible fevers.  He is tachypneic and hypoxic.  Discussed with Dr. Olga Millers who also saw the patient. 1. Admit to [facility] 2. Treat with Lasix 80 mg IV BID. 3. Add K+ 20 mEq po BID (he does not do well with IV K+) 4. Cover for pneumonia with Avelox 400 mg IV QD 5. Check CXR 6. ? If RUQ from congestion.  If no improvement consider abdominal ultrasound.  Will check LFTs.

## 2011-08-18 NOTE — H&P (Signed)
Admission History and Physical  Date: 08/18/2011   Name: [name]  DOB: [birth year]  MRN: [id number]   PCP: Dr. Drue Novel  Primary Cardiologist: Dr. Marca Ancona  Primary Electrophysiologist: Dr. Hillis Range   History of Present Illness:  [name] is a 77 y.o. male who presents for dyspnea.  He has a history of AS s/p redo AVR, diastolic CHF, CAD, chronic atrial fibrillation, s/p pacemaker. Patient had redo AVR with #23 Edwards pericardial valve in 7/12. Cath prior to surgery showed stable disease with patent grafts. Last seen by Dr. Marca Ancona 07/21/11. Weight at that time was 244 pounds. Lasix dose had been increased prior to that visit.   He notes increased dyspnea and abdominal bloating for the past 1-2 weeks. States he thinks he has had the flu. Notes a cough with yellowish sputum. Thinks he had a fever but did not record it. He has had chills. Does not weigh himself. No chest pain. No syncope. Sleeps on one pillow. Feels like his feet are more swollen. No PND.   Labs (1/12): LDL 71, HDL 29, K 4.7, creatinine 1.1  Labs (4/12): K 4, creatinine 1.0, BNP 262  Labs (7/12): LDL 92, HDL 32  Labs (8/12): K 4.3, creatinine 1.15 => 1.1, BNP 174  Labs (9/12): K 4, creatinine 0.9, BNP 126  Labs (1/13): BNP 130, K 3.6, creatinine 1.5 ==> K 4.1, creatinine 1.0, BNP 112   Past Medical History:  1. Aortic stenosis: s/p bioprosthetic aortic valve replacement in 06/2004. Echo in 7/11 showed mean gradient 35 mmHg across the bioprosthetic valve. Echo in 6/12 showed worsening of the bioprosthetic mean gradient to 46 mmHg. TEE in 7/12 showed only 1 cusp of the bioprosthetic aortic valve moving (heavily calcified), mean 52 mmHg/peak gradient 76 mmHg. Patient had redo AVR (7/12) with #23 Edwards pericardial valve. Echo (8/12) post AVR showed EF 55%, inferior/inferoseptal HK, bioprosthetic aortic valve with mean gradient 16/peak 28, mild MR, moderately dilated RV with mildly decreased RV systolic  function, PASP 68 mmHg.  2. CAD: CABG with AVR in 1/06. Patient had SVG-OM and SVG-D. LHC (7/12) showed 50% distal RCA lesion extending into PLV and PDA, total occlusion CFX, 50% pLAD, 90% ostial D1, patent SVG-D1 and SVG-OM.  3. Diabetes  4. Hyperlipidemia  5. HTN  6. Sick sinus syndrome with Medtronic dual chamber PCM  7. Obesity  8. OSA: Not using CPAP (cannot tolerate)  9. Chronic diastolic CHF: Echo in 7/11 showed EF 60-65% with moderate LV hypertrophy, moderate diastolic dysfunction, mild mitral stenosis, bioprosthetic aortic valve with mean gradient 35 mmHg (moderate stenosis) and PA systolic pressure 69 mmHg. Echo (6/12) showed EF 60-65%, moderate LV hypertrophy, bioprosthetic aortic valve with mean gradient 46 mmHg (severe stenosis) and PA systolic pressure 74 mmHg, mild MR.  10. Nephrolithiasis  11. PAD: Has seen [facility]. Lower extremity angiography in 9/10 showed occlusion of all 3 vessels below the knees in both legs. Patient then had L SFA stent and PTCA left AT by Dr. Myra Gianotti in 2011. He had amputation of the left great toe in 8/11.  12. Atrial fibrillation: Chronic, on coumadin. LAA thrombus noted on TEE in 7/12.  13. Carotid stenosis: carotid dopplers (7/11) with 40-59% bilateral ICA stenosis. Carotids (6/12) with 40-59% bilateral ICA stenosis.  14. Beta blocker intolerance.    Current Outpatient Prescriptions   Medication  Sig  Dispense  Refill   .  aspirin EC 81 MG EC tablet  Take 1 tablet (81  mg total) by mouth daily.   0   .  ezetimibe-simvastatin (VYTORIN) 10-40 MG per tablet  Take 1 tablet by mouth at bedtime.     .  furosemide (LASIX) 80 MG tablet  Take 2 in the morning and 1 every other afternoon at 4PM  270 tablet  1   .  insulin glargine (LANTUS) 100 UNIT/ML injection  Inject 20 Units into the skin daily.     .  insulin glulisine (APIDRA) 100 UNIT/ML injection  Takes anywhere from 8 to 14 with some meals     .  lisinopril (PRINIVIL,ZESTRIL) 5 MG tablet   Take 1 tablet (5 mg total) by mouth daily.  30 tablet  6   .  metFORMIN (GLUCOPHAGE-XR) 500 MG 24 hr tablet  take 1 tablet by mouth every morning and every evening  60 tablet  2   .  metoprolol tartrate (LOPRESSOR) 12.5 mg TABS  Take 12.5 mg by mouth 2 (two) times daily.     Marland Kitchen  oxycodone (OXY-IR) 5 MG capsule  Take 1 capsule (5 mg total) by mouth every 4 (four) hours as needed.  30 capsule  0   .  spironolactone (ALDACTONE) 25 MG tablet  Take 1 tablet (25 mg total) by mouth daily.  90 tablet  3   .  warfarin (COUMADIN) 5 MG tablet  Take 1 tablet (5 mg total) by mouth as directed.  120 tablet  1     Allergies:  Allergies   Allergen  Reactions   .  Metoprolol Succinate    .  Pioglitazone      REACTION: edema   .  Potassium-Containing Compounds      IV--loss of conciousness     History   Substance Use Topics   .  Smoking status:  Former Smoker     Types:  Cigarettes     Quit date:  06/23/1988   .  Smokeless tobacco:  Not on file   .  Alcohol Use:  No     Social History: Married, lives in [place]. 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 Quit smoking in 1990 (smoked heavily prior to this).   Family History: Father with MI at 57, mother with CVA at 85   ROS: Please see the history of present illness. Has coughed hard enough to cause him to vomit. No diarrhea, melena, hematochezia, dysuria. No hemoptysis. Does note RUQ pain he attributes to gas and he has had this in the past. All other systems reviewed and negative.   PHYSICAL EXAM:  VS: BP 128/58  Pulse 66  Ht 5\' 10"  (1.778 m)  Wt 238 lb (107.956 kg)  BMI 34.15 kg/m2  SpO2 87%  Well nourished, well developed male; he is tachypneic  HEENT: normal  Neck: + JVD  Cardiac: normal S1, S2; irreg irreg; 2/6 systolic murmur along LSB  Lungs: Bibasilar crackles, no wheezing, no rhonchi  Abd: soft, no hepatomegaly, + tenderness with palp of RUQ  Ext: trace bilateral edema  Skin: warm and dry  Neuro: CNs  2-12 intact, no focal abnormalities noted  Psych: normal affect   EKG: V paced, HR 66   ASSESSMENT AND PLAN:   Acute on chronic diastolic heart failure - Tereso Newcomer, PA 08/18/2011 12:02 PM Signed  Weights are down by our scales. But on exam he appears to be volume overloaded. He feels bloated in his abdomen. He has bibasilar crackles. Neck veins are up. He also has flu  like symptoms for the last 1-2 weeks with possible fevers. He is tachypneic and hypoxic. Discussed with Dr. Olga Millers who also saw the patient.  1. Admit to [facility] 2. Treat with Lasix 80 mg IV BID. 3. Add K+ 20 mEq po BID (he does not do well with IV K+) 4. Cover for pneumonia with Avelox 400 mg IV QD 5. Check CXR 6. ? If RUQ from congestion. If no improvement consider abdominal ultrasound. Will check LFTs.  Aortic valve stenosis, severe prosthetic - Tereso Newcomer, PA 08/18/2011 12:02 PM Signed  Recent echo with well functioning AVR.  ATRIAL FIBRILLATION - Tereso Newcomer, [place] 08/18/2011 12:03 PM Signed  Permanent. He remains on coumadin.   CORONARY ARTERY DISEASE - Tereso Newcomer, [place] 08/18/2011 12:03 PM Addendum  No angina. Check serial markers to rule out ischemic event. Previous Version  Dyspnea - Tereso Newcomer, [place] 08/18/2011 12:04 PM Signed  As noted, suspect he may have pneumonia. Will cover for pneumonia with Avelox and get CXR. Check Blood cultures and sputum culture if possible.   Signed,  Tereso Newcomer, PA-C  12:09 PM 08/18/2011   As above, patient seen and examined. Briefly 77 year old male with past medical history of aortic valve replacement, permanent atrial fibrillation, diastolic congestive heart failure, coronary artery disease, diabetes, hypertension, hyperlipidemia with congestive heart failure and possible pneumonia. The patient complains of progressive dyspnea. He also has orthopnea and pedal edema. No chest pain. Over the past 1-1/2 weeks he has noticed fevers and cough productive of yellow sputum. His  dyspnea has progressed and he presented for evaluation. Exam shows basilar crackles greater in the right lower lobe. Cardiovascular reveals a regular rate and rhythm with a 2/6 systolic murmur left sternal border. No diastolic murmur. There is some tenderness in the right upper quadrant. Extremities show 1+ edema. Plan admit for IV diuresis. Treat with Lasix 80 mg IV twice a day and follow renal function. We are also suspicious for possible pneumonia. Check chest x-ray and blood cultures. Begin antibiotics. Olga Millers

## 2011-08-19 ENCOUNTER — Inpatient Hospital Stay (HOSPITAL_COMMUNITY): Payer: Medicare Other

## 2011-08-19 DIAGNOSIS — I5033 Acute on chronic diastolic (congestive) heart failure: Secondary | ICD-10-CM | POA: Diagnosis not present

## 2011-08-19 DIAGNOSIS — R1011 Right upper quadrant pain: Secondary | ICD-10-CM | POA: Diagnosis not present

## 2011-08-19 DIAGNOSIS — J90 Pleural effusion, not elsewhere classified: Secondary | ICD-10-CM | POA: Diagnosis not present

## 2011-08-19 DIAGNOSIS — N200 Calculus of kidney: Secondary | ICD-10-CM | POA: Diagnosis not present

## 2011-08-19 DIAGNOSIS — J42 Unspecified chronic bronchitis: Secondary | ICD-10-CM | POA: Diagnosis not present

## 2011-08-19 DIAGNOSIS — I517 Cardiomegaly: Secondary | ICD-10-CM | POA: Diagnosis not present

## 2011-08-19 DIAGNOSIS — I2510 Atherosclerotic heart disease of native coronary artery without angina pectoris: Secondary | ICD-10-CM | POA: Diagnosis not present

## 2011-08-19 LAB — PROTIME-INR
INR: 2.62 — ABNORMAL HIGH (ref 0.00–1.49)
Prothrombin Time: 28.4 seconds — ABNORMAL HIGH (ref 11.6–15.2)

## 2011-08-19 LAB — GLUCOSE, CAPILLARY
Glucose-Capillary: 74 mg/dL (ref 70–99)
Glucose-Capillary: 155 mg/dL — ABNORMAL HIGH (ref 70–99)

## 2011-08-19 LAB — BASIC METABOLIC PANEL
CO2: 32 mEq/L (ref 19–32)
Chloride: 98 mEq/L (ref 96–112)
GFR calc Af Amer: >90 mL/min (ref >90)
Potassium: 4.2 mEq/L (ref 3.5–5.1)
Sodium: 138 mEq/L (ref 135–145)

## 2011-08-19 LAB — CARDIAC PANEL(CRET KIN+CKTOT+MB+TROPI)
CK, MB: 3.8 ng/mL (ref 0.3–4.0)
Total CK: 54 U/L (ref 7–232)
Troponin I: <0.3 ng/mL (ref <0.30)

## 2011-08-19 MED ORDER — FUROSEMIDE 10 MG/ML IJ SOLN
80.0000 mg | Freq: Three times a day (TID) | INTRAMUSCULAR | Status: DC
  Administered 2011-08-19 – 2011-08-21 (×6): 80 mg via INTRAVENOUS
  Filled 2011-08-19 (×10): qty 8

## 2011-08-19 NOTE — Progress Notes (Signed)
CBG:54  Treatment: 15 GM carbohydrate snack  Symptoms: Hungry  Follow-up CBG: Time:2221 CBG Result:94  Possible Reasons for Event: Inadequate meal intake   Comments/MD notified:no     [name], [employer]

## 2011-08-19 NOTE — Progress Notes (Addendum)
Patient ID: [name], male   DOB: September 28, 1934, 77 y.o.   MRN: [id number]    SUBJECTIVE: Short of breath with exertion.  Has had abdominal and lower leg swelling.  No chest pain.  He has had RUQ pain.      Marland Kitchen aspirin EC  81 mg Oral Daily  . ezetimibe-simvastatin  1 tablet Oral QHS  . furosemide  80 mg Intravenous BID  . insulin aspart  0-20 Units Subcutaneous TID WC  . insulin aspart  0-5 Units Subcutaneous QHS  . insulin glargine  20 Units Subcutaneous Daily  . insulin glargine  70 Units Subcutaneous Daily  . lisinopril  5 mg Oral Daily  . metFORMIN  500 mg Oral BID  . metoprolol tartrate  12.5 mg Oral BID  . moxifloxacin  400 mg Intravenous q1800  . potassium chloride  20 mEq Oral BID  . sodium chloride  3 mL Intravenous Q12H  . spironolactone  25 mg Oral Daily  . warfarin  10 mg Oral Custom  . warfarin  5 mg Oral Custom  . DISCONTD: aspirin EC  81 mg Oral Daily  . DISCONTD: insulin glargine  20 Units Subcutaneous Daily  . DISCONTD: insulin glargine  20 Units Subcutaneous Daily  . DISCONTD: metFORMIN  500 mg Oral BID WC  . DISCONTD: metoprolol tartrate  12.5 mg Oral BID  . DISCONTD: warfarin  10 mg Oral Custom  . DISCONTD: warfarin  5 mg Oral Custom      Filed Vitals:   08/18/11 1603 08/18/11 2145 08/18/11 2334 08/19/11 0450  BP: 119/53 112/50 100/59 109/64  Pulse:  61 66 64  Temp:  97.7 F (36.5 C)  97.6 F (36.4 C)  TempSrc:  Oral  Oral  Resp:  22 18 18   Height:      Weight:    235 lb 6.4 oz (106.777 kg)  SpO2:  93% 92% 95%    Intake/Output Summary (Last 24 hours) at 08/19/11 0804 Last data filed at 08/19/11 0600  Gross per 24 hour  Intake 1710.83 ml  Output    825 ml  Net 885.83 ml    LABS: Basic Metabolic Panel:  Basename 08/19/11 0630 08/18/11 1352  NA 138 139  K 4.2 4.7  CL 98 99  CO2 32 32  GLUCOSE 69* 145*  BUN 16 15  CREATININE 0.89 0.79  CALCIUM 10.2 10.2  MG -- 2.2  PHOS -- --   Liver Function Tests:  Basename 08/18/11 1352    AST 29  ALT 18  ALKPHOS 187*  BILITOT 0.5  PROT 8.1  ALBUMIN 2.7*   No results found for this basename: LIPASE:2,AMYLASE:2 in the last 72 hours CBC:  Basename 08/18/11 1352  WBC 15.5*  NEUTROABS 13.5*  HGB 13.4  HCT 41.4  MCV 86.8  PLT 283   Cardiac Enzymes:  Basename 08/19/11 0630 08/18/11 2124 08/18/11 1352  CKTOTAL 54 65 66  CKMB 3.8 3.5 3.8  CKMBINDEX -- -- --  TROPONINI <0.30 <0.30 <0.30   BNP: No components found with this basename: POCBNP:3 D-Dimer: No results found for this basename: DDIMER:2 in the last 72 hours Hemoglobin A1C:  Basename 08/18/11 1352  HGBA1C 8.2*   Fasting Lipid Panel: No results found for this basename: CHOL,HDL,LDLCALC,TRIG,CHOLHDL,LDLDIRECT in the last 72 hours Thyroid Function Tests:  Basename 08/18/11 1352  TSH 0.400  T4TOTAL --  T3FREE --  THYROIDAB --   Anemia Panel: No results found for this basename: VITAMINB12,FOLATE,FERRITIN,TIBC,IRON,RETICCTPCT in the last 72 hours  RADIOLOGY: No results found.  PHYSICAL EXAM General: NAD Neck: JVP 12 cm, no thyromegaly or thyroid nodule.  Lungs: Clear to auscultation bilaterally with normal respiratory effort. CV: Nondisplaced PMI.  Heart irregular S1/S2, no S3/S4, 2/6 early SEM.  1+ edema 1/4 up lower legs bilaterally.  No carotid bruit.  Normal pedal pulses.  Abdomen: Soft, mild distention, RUQ tenderness (mild).  Neurologic: Alert and oriented x 3.  Psych: Normal affect. Extremities: No clubbing or cyanosis.   TELEMETRY: Reviewed telemetry pt in atrial fibrillation  ASSESSMENT AND PLAN:  77 yo with CAD s/p CABG, bioprosthetic AVR, chronic atrial fibrillation, and diastolic CHF presented with flu-like symptoms, exertional dyspnea, and leg/abdominal swelling.  1. CAD: Stable, cardiac enzymes negative.  2. CHF: Acute on chronic diastolic CHF with volume overload.  May be in setting of viral illness.  - Increase Lasix to 80 mg IV q8 hrs.  - Strict I/Os, 1500 cc fluid  restriction.  - Echo 3. Atrial fibrillation: Chronic, continue warfarin. 4. ID: CXR this am to look for PNA.  On empiric moxifloxacin. 5. RUQ pain: May be liver congestions.  Transaminases and bilirubin normal, alkaline phosphatase is mildly elevated.  Will get RUQ [place].   Marca Ancona 08/19/2011 8:10 AM

## 2011-08-19 NOTE — Progress Notes (Signed)
ANTICOAGULATION CONSULT NOTE - Follow Up Consult  Pharmacy Consult for Coumadin Indication: bioprosthetic AVR, Afib  Allergies  Allergen Reactions  . Metoprolol Succinate   . Pioglitazone     REACTION: edema  . Potassium-Containing Compounds     IV--loss of conciousness   Vital Signs: Temp: 97.6 F (36.4 C) (02/26 0450) Temp src: Oral (02/26 0450) BP: 109/64 mmHg (02/26 0450) Pulse Rate: 64  (02/26 0450)  Labs:  Basename 08/19/11 0630 08/18/11 2124 08/18/11 1352  HGB -- -- 13.4  HCT -- -- 41.4  PLT -- -- 283  APTT -- -- 49*  LABPROT 28.4* -- 31.0*  INR 2.62* -- 2.93*  HEPARINUNFRC -- -- --  CREATININE 0.89 -- 0.79  CKTOTAL 54 65 66  CKMB 3.8 3.5 3.8  TROPONINI <0.30 <0.30 <0.30   Estimated Creatinine Clearance: 86.3 ml/min (by C-G formula based on Cr of 0.89).  Assessment: 76yom continues on coumadin with a therapeutic INR. No bleeding noted. D#2/7 Avelox.   Goal of Therapy:  INR 2.5-3.5   Plan:  1) Continue home coumadin regimen. Will receive 5mg  tonight. 2) Follow up INR in AM  Fredrik Rigger 08/19/2011,8:22 AM

## 2011-08-19 NOTE — Progress Notes (Signed)
Inpatient Diabetes Program Recommendations  AACE/ADA: New Consensus Statement on Inpatient Glycemic Control (2009)  Target Ranges:  Prepandial:   less than 140 mg/dL      Peak postprandial:   less than 180 mg/dL (1-2 hours)      Critically ill patients:  140 - 180 mg/dL   Reason for Visit: Hypoglycemia in hospital.  Hgb A1C is 8.2  Inpatient Diabetes Program Recommendations Insulin - Basal: Patient is scheduled to receive home dose of Lantus, 20 units every AM and 70 units at bedtime.  May need decrease in dosage.  Note: Patient received Lantus 20 units yesterday afternoon.  Did not receive bedtime dose of 70 last night, or am dose of 20 units this morning (patient refused).  Request MD lower Lantus dosages-- perhaps half home doses-- and titrate as needed.  Thank you.

## 2011-08-19 NOTE — Progress Notes (Signed)
Pt cbg was 68. Pt was given two applejuices and his dinner.  Will recheck in 20 minutes.

## 2011-08-19 NOTE — Progress Notes (Signed)
08/19/11 1745 UR COMPLETED. Tera Mater, RN, BSN [phone]

## 2011-08-19 NOTE — Progress Notes (Signed)
Pt had questionable 18 beats of vtach. Vs taken 109/54 HR=52. Pt asymptomatic. Dr Margo Aye made aware updated w/ questionable v-tach and vital signs . No orders received. Continue to monitor pt closely.

## 2011-08-20 DIAGNOSIS — I059 Rheumatic mitral valve disease, unspecified: Secondary | ICD-10-CM | POA: Diagnosis not present

## 2011-08-20 DIAGNOSIS — I5033 Acute on chronic diastolic (congestive) heart failure: Secondary | ICD-10-CM | POA: Diagnosis not present

## 2011-08-20 LAB — GLUCOSE, CAPILLARY
Glucose-Capillary: 39 mg/dL — CL (ref 70–99)
Glucose-Capillary: 131 mg/dL — ABNORMAL HIGH (ref 70–99)
Glucose-Capillary: 119 mg/dL — ABNORMAL HIGH (ref 70–99)
Glucose-Capillary: 157 mg/dL — ABNORMAL HIGH (ref 70–99)

## 2011-08-20 LAB — BASIC METABOLIC PANEL
BUN: 20 mg/dL (ref 6–23)
Chloride: 95 mEq/L — ABNORMAL LOW (ref 96–112)
GFR calc Af Amer: 79 mL/min — ABNORMAL LOW (ref >90)
Potassium: 4.4 mEq/L (ref 3.5–5.1)

## 2011-08-20 LAB — CBC
MCV: 87.7 fL (ref 78.0–100.0)
Platelets: 286 10*3/uL (ref 150–400)
RBC: 4.62 MIL/uL (ref 4.22–5.81)
WBC: 10.2 10*3/uL (ref 4.0–10.5)

## 2011-08-20 MED ORDER — WARFARIN - PHYSICIAN DOSING INPATIENT
Freq: Every day | Status: DC
  Filled 2011-08-20 (×2): qty 1

## 2011-08-20 MED ORDER — INSULIN GLARGINE 100 UNIT/ML ~~LOC~~ SOLN
35.0000 [IU] | Freq: Every day | SUBCUTANEOUS | Status: DC
  Administered 2011-08-20: 35 [IU] via SUBCUTANEOUS

## 2011-08-20 MED ORDER — INSULIN GLARGINE 100 UNIT/ML ~~LOC~~ SOLN
10.0000 [IU] | Freq: Every day | SUBCUTANEOUS | Status: DC
  Administered 2011-08-20: 10 [IU] via SUBCUTANEOUS

## 2011-08-20 NOTE — Progress Notes (Signed)
  Echocardiogram 2D Echocardiogram has been performed.  Mercy Moore 08/20/2011, 3:24 PM

## 2011-08-20 NOTE — Progress Notes (Signed)
Patient ID: [name], male   DOB: 09-16-1934, 77 y.o.   MRN: [id number]     SUBJECTIVE: Breathing better today.  Diuresed well yesterday.  CXR showed possible PNA, he is on moxifloxacin.     Marland Kitchen aspirin EC  81 mg Oral Daily  . ezetimibe-simvastatin  1 tablet Oral QHS  . furosemide  80 mg Intravenous Q8H  . insulin aspart  0-20 Units Subcutaneous TID WC  . insulin aspart  0-5 Units Subcutaneous QHS  . insulin glargine  10 Units Subcutaneous Daily  . insulin glargine  35 Units Subcutaneous Daily  . lisinopril  5 mg Oral Daily  . metFORMIN  500 mg Oral BID  . metoprolol tartrate  12.5 mg Oral BID  . moxifloxacin  400 mg Intravenous q1800  . potassium chloride  20 mEq Oral BID  . sodium chloride  3 mL Intravenous Q12H  . spironolactone  25 mg Oral Daily  . warfarin  10 mg Oral Custom  . warfarin  5 mg Oral Custom  . DISCONTD: furosemide  80 mg Intravenous BID  . DISCONTD: insulin glargine  20 Units Subcutaneous Daily  . DISCONTD: insulin glargine  70 Units Subcutaneous Daily      Filed Vitals:   08/19/11 2100 08/19/11 2151 08/20/11 0500 08/20/11 0614  BP: 89/58 108/65  129/70  Pulse: 67 65  67  Temp: 97.7 F (36.5 C)   97.4 F (36.3 C)  TempSrc: Oral   Oral  Resp: 18   19  Height:      Weight:   232 lb 12.9 oz (105.6 kg) 232 lb 12.9 oz (105.6 kg)  SpO2: 91%   95%    Intake/Output Summary (Last 24 hours) at 08/20/11 0754 Last data filed at 08/20/11 0616  Gross per 24 hour  Intake   1108 ml  Output   3075 ml  Net  -1967 ml    LABS: Basic Metabolic Panel:  Basename 08/20/11 0618 08/19/11 0630 08/18/11 1352  NA 139 138 --  K 4.4 4.2 --  CL 95* 98 --  CO2 36* 32 --  GLUCOSE 126* 69* --  BUN 20 16 --  CREATININE 1.05 0.89 --  CALCIUM 10.4 10.2 --  MG -- -- 2.2  PHOS -- -- --   Liver Function Tests:  Basename 08/18/11 1352  AST 29  ALT 18  ALKPHOS 187*  BILITOT 0.5  PROT 8.1  ALBUMIN 2.7*   No results found for this basename: LIPASE:2,AMYLASE:2 in  the last 72 hours CBC:  Basename 08/20/11 0618 08/18/11 1352  WBC 10.2 15.5*  NEUTROABS -- 13.5*  HGB 12.8* 13.4  HCT 40.5 41.4  MCV 87.7 86.8  PLT 286 283   Cardiac Enzymes:  Basename 08/19/11 0630 08/18/11 2124 08/18/11 1352  CKTOTAL 54 65 66  CKMB 3.8 3.5 3.8  CKMBINDEX -- -- --  TROPONINI <0.30 <0.30 <0.30   BNP: No components found with this basename: POCBNP:3 D-Dimer: No results found for this basename: DDIMER:2 in the last 72 hours Hemoglobin A1C:  Basename 08/18/11 1352  HGBA1C 8.2*   Fasting Lipid Panel: No results found for this basename: CHOL,HDL,LDLCALC,TRIG,CHOLHDL,LDLDIRECT in the last 72 hours Thyroid Function Tests:  Basename 08/18/11 1352  TSH 0.400  T4TOTAL --  T3FREE --  THYROIDAB --   Anemia Panel: No results found for this basename: VITAMINB12,FOLATE,FERRITIN,TIBC,IRON,RETICCTPCT in the last 72 hours  RADIOLOGY: CXR: Bilateral lower lobe airspace disease, ? PNA Abdominal US: No gallstones  PHYSICAL EXAM General: NAD Neck:  JVP 8-9 cm, no thyromegaly or thyroid nodule.  Lungs: Clear to auscultation bilaterally with normal respiratory effort. CV: Nondisplaced PMI.  Heart irregular S1/S2, no S3/S4, 1/6 early SEM.  1+ edema at ankles bilaterally.  No carotid bruit.  Normal pedal pulses.  Abdomen: Soft, mild distention, RUQ tenderness (mild).  Neurologic: Alert and oriented x 3.  Psych: Normal affect. Extremities: No clubbing or cyanosis.   TELEMETRY: Reviewed telemetry pt in atrial fibrillation  ASSESSMENT AND PLAN:  77 yo with CAD s/p CABG, bioprosthetic AVR, chronic atrial fibrillation, and diastolic CHF presented with flu-like symptoms, exertional dyspnea, and leg/abdominal swelling.  1. CAD: Stable, cardiac enzymes negative.  2. CHF: Acute on chronic diastolic CHF with volume overload.  May be in setting of PNA (versus viral illness).  - Continue Lasix 80 mg IV q8 hrs today, switch to po tomorrow.  - Echo 3. Atrial fibrillation:  Chronic, continue warfarin. 4. ID: Possible PNA on CXR.  Continue moxifloxacin x 14 days.  5. RUQ pain: Resolved.  US unremarkable.  6. Possible d/c tomorrow.   Marca Ancona 08/20/2011 7:54 AM

## 2011-08-20 NOTE — Progress Notes (Signed)
ANTICOAGULATION CONSULT NOTE - Follow Up Consult  Pharmacy Consult for Coumadin Indication: bioprosthetic AVR, Afib  Allergies  Allergen Reactions  . Metoprolol Succinate   . Pioglitazone     REACTION: edema  . Potassium-Containing Compounds     IV--loss of conciousness   Vital Signs: Temp: 97.4 F (36.3 C) (02/27 0614) Temp src: Oral (02/27 0614) BP: 130/68 mmHg (02/27 1035) Pulse Rate: 67  (02/27 0614)  Labs:  Basename 08/20/11 0618 08/19/11 0630 08/18/11 2124 08/18/11 1352  HGB 12.8* -- -- 13.4  HCT 40.5 -- -- 41.4  PLT 286 -- -- 283  APTT -- -- -- 49*  LABPROT 23.3* 28.4* -- 31.0*  INR 2.03* 2.62* -- 2.93*  HEPARINUNFRC -- -- -- --  CREATININE 1.05 0.89 -- 0.79  CKTOTAL -- 54 65 66  CKMB -- 3.8 3.5 3.8  TROPONINI -- <0.30 <0.30 <0.30   Estimated Creatinine Clearance: 72.7 ml/min (by C-G formula based on Cr of 1.05).  Assessment: 76yom on coumadin with a subtherapeutic INR= 2.03 today, trending down form 2.93-->2.63-->2.03.  No bleeding noted. On Coumadin PTA for h/o mechanical AVR/Afib. Coumadin 10mg  dose due tonight per home regimen. MD dosing inpatient coumadin.  D#3/7 Avelox.   Goal of Therapy:  INR 2.5-3.5   Plan:  Duplicate orders for Coumadin per Pharmacist  and per MD Dosing entered; Dr. Jens Som order "per MD" is the most recent order. Will DC the pharmacy consult but cont to follow. Dr. Shirlee Latch has continued the home regimen of coumadin 5mg  every MTTSS and 10mg  qWF (10mg  due today) , ordered INR in AM (has daily INRs ordered).  Please reconsult Pharmacy to dose coumadin if desired.   Thank You, Arman Filter ,RPh 08/20/2011,12:06 PM    7

## 2011-08-21 DIAGNOSIS — I482 Chronic atrial fibrillation, unspecified: Secondary | ICD-10-CM

## 2011-08-21 DIAGNOSIS — I5033 Acute on chronic diastolic (congestive) heart failure: Secondary | ICD-10-CM | POA: Diagnosis not present

## 2011-08-21 LAB — BASIC METABOLIC PANEL
Chloride: 91 mEq/L — ABNORMAL LOW (ref 96–112)
GFR calc Af Amer: 78 mL/min — ABNORMAL LOW (ref >90)
GFR calc non Af Amer: 67 mL/min — ABNORMAL LOW (ref >90)
Glucose, Bld: 118 mg/dL — ABNORMAL HIGH (ref 70–99)
Potassium: 4 mEq/L (ref 3.5–5.1)
Sodium: 136 mEq/L (ref 135–145)

## 2011-08-21 LAB — GLUCOSE, CAPILLARY: Glucose-Capillary: 42 mg/dL — CL (ref 70–99)

## 2011-08-21 LAB — PROTIME-INR: INR: 1.69 — ABNORMAL HIGH (ref 0.00–1.49)

## 2011-08-21 LAB — CBC
Platelets: 334 10*3/uL (ref 150–400)
RBC: 4.96 MIL/uL (ref 4.22–5.81)
RDW: 14.9 % (ref 11.5–15.5)
WBC: 13.2 10*3/uL — ABNORMAL HIGH (ref 4.0–10.5)

## 2011-08-21 MED ORDER — FUROSEMIDE 80 MG PO TABS
80.0000 mg | ORAL_TABLET | Freq: Every evening | ORAL | Status: DC
  Filled 2011-08-21: qty 1

## 2011-08-21 MED ORDER — POTASSIUM CHLORIDE CRYS ER 20 MEQ PO TBCR: 20.0000 meq | EXTENDED_RELEASE_TABLET | Freq: Two times a day (BID) | ORAL | Status: DC

## 2011-08-21 MED ORDER — FUROSEMIDE 80 MG PO TABS
160.0000 mg | ORAL_TABLET | Freq: Every morning | ORAL | Status: DC
  Administered 2011-08-21: 160 mg via ORAL
  Filled 2011-08-21: qty 2

## 2011-08-21 MED ORDER — FUROSEMIDE 80 MG PO TABS: 160.0000 mg | ORAL_TABLET | Freq: Every morning | ORAL | Status: DC

## 2011-08-21 MED ORDER — MOXIFLOXACIN HCL 400 MG PO TABS: 400.0000 mg | ORAL_TABLET | Freq: Every day | ORAL | Status: DC

## 2011-08-21 MED ORDER — FUROSEMIDE 80 MG PO TABS: 80.0000 mg | ORAL_TABLET | Freq: Every evening | ORAL | Status: DC

## 2011-08-21 NOTE — Progress Notes (Signed)
DC instructions given to pt and pt verbalized understanding.  Pt DC home via wc.

## 2011-08-21 NOTE — Significant Event (Signed)
CBG: 42  Treatment: orange juice, crackers, and ice cream  Symptoms: Sweaty, Shaky and Nervous/irritable  Follow-up CBG: Time:03:39 CBG Result:97  Possible Reasons for Event: Medication regimen: lantus 35 units at [facility]  Comments/MD notified:No    [address]

## 2011-08-21 NOTE — Progress Notes (Signed)
Patient ID: [name], male   DOB: Nov 02, 1934, 77 y.o.   MRN: [id number]    SUBJECTIVE: Breathing better this morning.  No cough.  Feels back to baseline.  Diuresed well yesterday.      Marland Kitchen aspirin EC  81 mg Oral Daily  . ezetimibe-simvastatin  1 tablet Oral QHS  . furosemide  160 mg Oral q morning - 10a  . furosemide  80 mg Oral QPM  . insulin aspart  0-20 Units Subcutaneous TID WC  . insulin aspart  0-5 Units Subcutaneous QHS  . insulin glargine  10 Units Subcutaneous Daily  . insulin glargine  35 Units Subcutaneous Daily  . lisinopril  5 mg Oral Daily  . metFORMIN  500 mg Oral BID  . metoprolol tartrate  12.5 mg Oral BID  . moxifloxacin  400 mg Intravenous q1800  . potassium chloride  20 mEq Oral BID  . sodium chloride  3 mL Intravenous Q12H  . spironolactone  25 mg Oral Daily  . warfarin  10 mg Oral Custom  . warfarin  5 mg Oral Custom  . Warfarin - Physician Dosing Inpatient   Does not apply q1800  . DISCONTD: furosemide  80 mg Intravenous Q8H  . DISCONTD: insulin glargine  20 Units Subcutaneous Daily  . DISCONTD: insulin glargine  70 Units Subcutaneous Daily      Filed Vitals:   08/20/11 1035 08/20/11 1354 08/20/11 2128 08/21/11 0435  BP: 130/68 106/54 121/51 104/43  Pulse:  65 66 68  Temp:  98.3 F (36.8 C) 98.1 F (36.7 C) 98.2 F (36.8 C)  TempSrc:  Oral Oral Oral  Resp:  20 18 18   Height:      Weight:    232 lb 2.3 oz (105.3 kg)  SpO2:  95% 94% 91%    Intake/Output Summary (Last 24 hours) at 08/21/11 0730 Last data filed at 08/21/11 0443  Gross per 24 hour  Intake   1646 ml  Output   3900 ml  Net  -2254 ml    LABS: Basic Metabolic Panel:  Basename 08/20/11 0618 08/19/11 0630 08/18/11 1352  NA 139 138 --  K 4.4 4.2 --  CL 95* 98 --  CO2 36* 32 --  GLUCOSE 126* 69* --  BUN 20 16 --  CREATININE 1.05 0.89 --  CALCIUM 10.4 10.2 --  MG -- -- 2.2  PHOS -- -- --   Liver Function Tests:  Basename 08/18/11 1352  AST 29  ALT 18  ALKPHOS  187*  BILITOT 0.5  PROT 8.1  ALBUMIN 2.7*   No results found for this basename: LIPASE:2,AMYLASE:2 in the last 72 hours CBC:  Basename 08/20/11 0618 08/18/11 1352  WBC 10.2 15.5*  NEUTROABS -- 13.5*  HGB 12.8* 13.4  HCT 40.5 41.4  MCV 87.7 86.8  PLT 286 283   Cardiac Enzymes:  Basename 08/19/11 0630 08/18/11 2124 08/18/11 1352  CKTOTAL 54 65 66  CKMB 3.8 3.5 3.8  CKMBINDEX -- -- --  TROPONINI <0.30 <0.30 <0.30   BNP: No components found with this basename: POCBNP:3 D-Dimer: No results found for this basename: DDIMER:2 in the last 72 hours Hemoglobin A1C:  Basename 08/18/11 1352  HGBA1C 8.2*   Fasting Lipid Panel: No results found for this basename: CHOL,HDL,LDLCALC,TRIG,CHOLHDL,LDLDIRECT in the last 72 hours Thyroid Function Tests:  Basename 08/18/11 1352  TSH 0.400  T4TOTAL --  T3FREE --  THYROIDAB --   Anemia Panel: No results found for this basename: VITAMINB12,FOLATE,FERRITIN,TIBC,IRON,RETICCTPCT in the last  72 hours  RADIOLOGY: Dg Chest 2 View  08/19/2011  *RADIOLOGY REPORT*  Clinical Data: Cough and dyspnea.  CHEST - 2 VIEW  Comparison: 02/11/2011.  Findings: Stable surgical changes from bypass and valve replacement surgery.  The right ventricular pacer wire is unchanged. There are underlying chronic bronchitic type changes with superimposed bilateral lower lobe airspace opacities which is likely pneumonia. Asymmetric perihilar edema is also possible but less likely.  Small pleural effusions are noted.  No pneumothorax.  IMPRESSION:  1.  Stable cardiac enlargement and chronic underlying bronchitic type lung changes. 2.  Perihilar infiltrates or less likely asymmetric edema. 3.  Small bilateral pleural effusions.  Original Report Authenticated By: P. Loralie Champagne, M.D.   US Abdomen Limited Ruq  08/19/2011  *RADIOLOGY REPORT*  Clinical Data:  Right upper quadrant pain.  Rule out gallbladder disease.  LIMITED ABDOMINAL ULTRASOUND - RIGHT UPPER QUADRANT   Comparison:  None.  Findings:  Gallbladder:  No gallstones, gallbladder wall thickening, or pericholecystic fluid.  Common bile duct:  Within normal limits in caliber. Measures 4.0 mm.  Liver:  No focal lesion identified.  Within normal limits in parenchymal echogenicity.  Additional findings:  Incidentally noted is a 1.2 cm stone in the left kidney.  There is no hydronephrosis.  The right kidney appears within normal limits.  Bilateral pleural effusions are noted.  IMPRESSION:  1.  Normal sonographic appearance of the gallbladder. Negative for biliary ductal dilatation. 2. Bilateral pleural effusions. 3. 1.2 cm nonobstructing left renal stone.  Original Report Authenticated By: Britta Mccreedy, M.D.   PHYSICAL EXAM General: NAD Neck: JVP 8 cm, no thyromegaly or thyroid nodule.  Lungs: Clear to auscultation bilaterally with normal respiratory effort. CV: Nondisplaced PMI.  Heart irregular S1/S2, no S3/S4, 1/6 early SEM.  Trace edema at ankles bilaterally.  No carotid bruit.  Normal pedal pulses.  Abdomen: Soft, mild distention, RUQ tenderness (mild).  Neurologic: Alert and oriented x 3.  Psych: Normal affect. Extremities: No clubbing or cyanosis.   TELEMETRY: Reviewed telemetry pt in atrial fibrillation  ASSESSMENT AND PLAN:  77 yo with CAD s/p CABG, bioprosthetic AVR, chronic atrial fibrillation, and diastolic CHF presented with flu-like symptoms, exertional dyspnea, and leg/abdominal swelling.  1. CAD: Stable, cardiac enzymes negative.  2. CHF: Acute on chronic diastolic CHF with volume overload.  May be in setting of PNA (versus viral illness). Echo with preserved EF, stable bioprosthetic aortic valve, mild MR and MS, mildly depressed RV function with moderate pulmonary HTN (similar to prior).   - Change Lasix to po today. 3. Atrial fibrillation: Chronic, continue warfarin. 4. ID: Possible PNA on CXR.  Continue moxifloxacin x 14 days.  5. RUQ pain: Resolved.  US unremarkable.  6. Dispo:  Discharge home today.  Followup with me in 1 week with a BMET in 1 week.  Continue prior to admission home regimen except he will take Lasix 160 mg qam, 80 mg qpm every day (rather then the 80 qpm every other day) and complete 14 days of moxifloxacin.  He will follow his home insulin regimen.   Marca Ancona 08/21/2011 7:33 AM

## 2011-08-21 NOTE — Discharge Summary (Signed)
Discharge Summary   Patient ID: [name],  MRN: [id number], DOB/AGE: 30-Jul-1934 77 y.o.  Admit date: 08/18/2011 Discharge date: 08/21/2011  Discharge Diagnoses Principal Problem:  *Acute on chronic diastolic heart failure Active Problems:  DIABETES MELLITUS, TYPE II  HYPERTENSION  CORONARY ARTERY DISEASE  AORTIC VALVE REPLACEMENT, HX OF  Permanent atrial fibrillation   Allergies Allergies  Allergen Reactions  . Metoprolol Succinate   . Pioglitazone     REACTION: edema  . Potassium-Containing Compounds     IV--loss of conciousness    Procedures  2D echocardiogram with contrast  Study Conclusions  - Left ventricle: The cavity size was normal. Wall thickness was increased in a pattern of moderate LVH. Systolic function was normal. The estimated ejection fraction was in the range of 55% to 60%. Wall motion was normal; there were no regional wall motion abnormalities. - Aortic valve: A bioprosthesis was present. Valve area: 0.93cm^2(VTI). Valve area: 0.97cm^2 (Vmax). - Mitral valve: Severely calcified annulus. The findings are consistent with mild stenosis. Mild regurgitation. Valve area by continuity equation (using LVOT flow): 0.84cm^2. - Left atrium: The atrium was moderately dilated. - Right ventricle: The cavity size was mildly dilated. Systolic function was mildly to moderately reduced. - Right atrium: The atrium was mildly dilated. - Pulmonary arteries: Systolic pressure was moderately to severely increased. PA peak pressure: 61mm Hg (S).  ------------------------------------------------------------ Labs, prior tests, procedures, and surgery: 2nd Aortic valve replacement. Permanent pacemaker system implantation. Coronary artery bypass grafting. Transthoracic echocardiography. M-mode, complete 2D, spectral Doppler, and color Doppler. Height: Height: 172.7cm. Height: 68in. Weight: Weight: 105.2kg. Weight: 231.5lb. Body mass index: BMI: 35.3kg/m^2.  Body surface area: BSA: 2.21m^2. Blood pressure: 106/54. Patient status: Inpatient. Location: Bedside.  ------------------------------------------------------------  ------------------------------------------------------------ Left ventricle: The cavity size was normal. Wall thickness was increased in a pattern of moderate LVH. Systolic function was normal. The estimated ejection fraction was in the range of 55% to 60%. Wall motion was normal; there were no regional wall motion abnormalities.  ------------------------------------------------------------ Aortic valve: A bioprosthesis was present. Doppler: No regurgitation. VTI ratio of LVOT to aortic valve: 0.53. Valve area: 0.93cm^2(VTI). Indexed valve area: 0.43cm^2/m^2 (VTI). Peak velocity ratio of LVOT to aortic valve: 0.55. Valve area: 0.97cm^2 (Vmax). Indexed valve area: 0.44cm^2/m^2 (Vmax). Mean gradient: 14mm Hg (S). Peak gradient: 25mm Hg (S).  ------------------------------------------------------------ Aorta: Aortic root: The aortic root was normal in size.  ------------------------------------------------------------ Mitral valve: Severely calcified annulus. Doppler: The findings are consistent with mild stenosis. Mild regurgitation. Valve area by pressure half-time: 2.59cm^2. Indexed valve area by pressure half-time: 1.19cm^2/m^2. Valve area by continuity equation (using LVOT flow): 0.84cm^2. Indexed valve area by continuity equation (using LVOT flow): 0.39cm^2/m^2. Mean gradient: 5mm Hg (D). Peak gradient: 18mm Hg (D).  ------------------------------------------------------------ Left atrium: The atrium was moderately dilated.  ------------------------------------------------------------ Right ventricle: The cavity size was mildly dilated. Pacer wire or catheter noted in right ventricle. Systolic function was mildly to moderately reduced.  ------------------------------------------------------------ Pulmonic  valve: Doppler: Transvalvular velocity was within the normal range. There was no evidence for stenosis. Trivial regurgitation.  ------------------------------------------------------------ Tricuspid valve: Structurally normal valve. Doppler: Transvalvular velocity was within the normal range. Mild regurgitation.  ------------------------------------------------------------ Pulmonary artery: Systolic pressure was moderately to severely increased.  ------------------------------------------------------------ Right atrium: The atrium was mildly dilated. Pacer wire or catheter noted in right atrium.  ------------------------------------------------------------ Pericardium: There was no pericardial effusion.  ------------------------------------------------------------ Systemic veins: Inferior vena cava: The vessel was dilated.  ------------------------------------------------------------  2D measurements Normal Doppler measurements Normal Left ventricle Main pulmonary  LVID ED, 49 mm 43-52 artery chord, Pressure, 61 mm Hg =30 PLAX S LVID ES, 33 mm 23-38 Pressure 15 mm Hg ------ chord, ED PLAX LVOT FS, chord, 33 % >29 Peak vel, 138 cm/s ------ PLAX S LVPW, ED 14 mm ------ VTI, S 28.7 cm ------ IVS/LVPW 1.21 <1.3 Peak 8 mm Hg ------ ratio, ED gradient, Ventricular septum S IVS, ED 17 mm ------ Aortic valve LVOT Peak vel, 251 cm/s ------ Diam, S 15 mm ------ S Area 1.77 cm^2 ------ Mean vel, 174 cm/s ------ Aorta S Root diam 28 mm ------ VTI, S 54.6 cm ------ Root diam, 28 mm ------ Mean 14 mm Hg ------ ED gradient, Left atrium S AP dim 55 mm ------ Peak 25 mm Hg ------ AP dim 2.52 cm/m^2 <2.2 gradient, index S Vol, S 114 ml ------ VTI ratio 0.53 ------ Vol index, 52.3 ml/m^2 ------ LVOT/AV S Area, VTI 0.93 cm^2 ------ Right ventricle Area index 0.43 cm^2/m ------ RVID ED, 39 mm 19-38 (VTI) ^2 PLAX Peak vel 0.55 ------ ratio, LVOT/AV Area, Vmax 0.97 cm^2  ------ Area index 0.44 cm^2/m ------ (Vmax) ^2 Mitral valve Peak E vel 199 cm/s ------ Mean vel, 88.1 cm/s ------ D Decelerati 335 ms 150-23 on time 0 Pressure 85 ms ------ half-time Mean 5 mm Hg ------ gradient, D Peak 18 mm Hg ------ gradient, D Area (PHT) 2.59 cm^2 ------ Area index 1.19 cm^2/m ------ (PHT) ^2 Area 0.84 cm^2 ------ (LVOT) continuity Area index 0.39 cm^2/m ------ (LVOT ^2 cont) Annulus 60.5 cm ------ VTI Tricuspid valve Regurg 356 cm/s ------ peak vel Peak RV-RA 51 mm Hg ------ gradient, S Systemic veins Estimated 10 mm Hg ------ CVP Right ventricle Pressure, 61 mm Hg <30 S Sa vel, 9.85 cm/s ------ lat ann, tiss DP Pulmonic valve Regurg 113 cm/s ------ vel, ED  History of Present Illness  Mr. Niemann is a 77yo male with PMHx significant for AS (s/p redo AVR, bioprosthetic 07/12), chronic diastolic CHF, CAD (s/p CABG, patent grafts on pre-CABG cardiac cath) and permanent atrial fibrillation (s/p pacemaker implantation, on chronic Coumadin) who was admitted directly to [facility] with acute on chronic diastolic CHF.   He was seen at the [facility] office on 02/25. At that time, he reported increased dyspnea and abdominal bloating for the past 1-2 weeks. He stated that he thought he had the flu noted a cough with yellowish sputum. He endorsed a subjective fever, and chills. He had not weighed himself recently, but stated he thought his feet felt more swollen. He denied chest pain, syncope, orthopnea or PND.   Hospital Course   He was subsequently admitted to [facility] and started on Lasix IV. There was a suspicion of a possible pneumonia evidenced on CXR and he was started on Avelox IV. Coumadin was continued, managed by pharmacy. The following day, his symptoms improved and CEs were cycled and found to be negative x 3. He did complain of RUQ pain without evidence of elevated LFTs. RUQ u/s was negative for acute hepatobiliary process. He remained afebrile  with VSS. A 2D echocardiogram was performed revealing the above details including LVEF 55-60%, no WMAs, moderate LVH, visualized bioprosthetic AV, mild MS/MR, mod dilated LA, mildly dilated RV/RA with moderate to severe pulmonary arterial systolic pressure.  He diuresed well and was switched to PO. His breathing improved (net I/O: - ) as well as his cough. Today, he is stable, at baseline, and will be discharged home. His outpatient Lasix regimen has been up-titrated as outlined below.  He will continue Avelox PO x 14 days for CAP coverage. He will follow-up in 6 days with a BMET and appointment in the office. He will continue all other outpatient medications as prescribed.   Discharge Vitals:  Blood pressure 128/67, pulse 64, temperature 98.2 F (36.8 C), temperature source Oral, resp. rate 18, height 5\' 8"  (1.727 m), weight 105.3 kg (232 lb 2.3 oz), SpO2 91.00%.  Weight change: -0.3 kg (-10.6 oz)  Labs: Recent Labs  Basename 08/21/11 0700 08/20/11 0618   WBC 13.2* 10.2   HGB 13.6 12.8*   HCT 43.8 40.5   MCV 88.3 87.7   PLT 334 286    Lab 08/21/11 0700 08/20/11 0618 08/19/11 0630 08/18/11 1352  NA 136 139 138 --  K 4.0 4.4 4.2 --  CL 91* 95* 98 --  CO2 34* 36* 32 --  BUN 21 20 16  --  CREATININE 1.06 1.05 0.89 --  CALCIUM 10.7* 10.4 10.2 --  PROT -- -- -- 8.1  BILITOT -- -- -- 0.5  ALKPHOS -- -- -- 187*  ALT -- -- -- 18  AST -- -- -- 29  AMYLASE -- -- -- --  LIPASE -- -- -- --  GLUCOSE 118* 126* 69* --   Recent Labs  Basename 08/18/11 1352   HGBA1C 8.2*   Recent Labs  Basename 08/19/11 0630 08/18/11 2124 08/18/11 1352   CKTOTAL 54 65 66   CKMB 3.8 3.5 3.8   CKMBINDEX -- -- --   TROPONINI <0.30 <0.30 <0.30    Basename 08/18/11 1352  TSH 0.400  T4TOTAL --  T3FREE --  THYROIDAB --    Disposition:  Discharge Orders    Future Appointments: Provider: Department: Dept Phone: Center:   08/27/2011 11:50 AM [facility] [phone]  [facility]   08/27/2011 12:00 PM Jacolyn Reedy, PA [facility] [place] [phone] [facility]   09/08/2011 9:00 AM [facility] [phone] [facility]   09/08/2011 9:30 AM [facility] [phone] [facility]   10/20/2011 9:00 AM Marca Ancona, MD [facility] [phone] [facility]   03/15/2012 9:00 AM [facility] [phone] [facility]   03/15/2012 9:30 AM [facility] [phone] [facility]   03/15/2012 10:00 AM V Durene Cal, MD [facility] [phone] [facility]     Follow-up Information    Follow up with [facility] on 08/27/2011. (For blood work. Please arrive in the morning prior to your follow-up appointment.)    Contact information:   [address] [place] [address]       Follow up with Jacolyn Reedy, PA on 08/27/2011. (At 12:00 PM for follow-up for this hospitalization. )    Contact information:   [address] [place] [address] [phone]         Discharge Medications:  Medication List  As of 08/21/2011 12:57 PM   START taking these medications         * furosemide 80 MG tablet   Commonly known as: LASIX   Take 1 tablet (80 mg total) by mouth every evening.      moxifloxacin 400 MG tablet   Commonly known as: AVELOX   Take 1 tablet (400 mg total) by mouth daily.      potassium chloride SA 20 MEQ tablet   Commonly known as: K-DUR,KLOR-CON   Take 1 tablet (20 mEq total) by mouth 2 (two) times daily.     * Notice: This  list has 1 medication(s) that are the same as other medications prescribed for you. Read the directions carefully, and ask your doctor or other care provider to review them with you.       CHANGE how you take these medications         * furosemide 80 MG tablet   Commonly known as: LASIX   Take 2 tablets (160 mg total) by mouth every morning.   What changed: - dose - doctor's instructions     * Notice: This list  has 1 medication(s) that are the same as other medications prescribed for you. Read the directions carefully, and ask your doctor or other care provider to review them with you.       CONTINUE taking these medications         aspirin EC 81 MG tablet   Take 1 tablet (81 mg total) by mouth daily.      ezetimibe-simvastatin 10-40 MG per tablet   Commonly known as: VYTORIN      insulin glargine 100 UNIT/ML injection   Commonly known as: LANTUS      insulin glulisine 100 UNIT/ML injection   Commonly known as: APIDRA      lisinopril 5 MG tablet   Commonly known as: PRINIVIL,ZESTRIL   Take 1 tablet (5 mg total) by mouth daily.      metFORMIN 500 MG 24 hr tablet   Commonly known as: GLUCOPHAGE-XR      metoprolol tartrate 25 MG tablet   Commonly known as: LOPRESSOR      spironolactone 25 MG tablet   Commonly known as: ALDACTONE   Take 1 tablet (25 mg total) by mouth daily.      warfarin 5 MG tablet   Commonly known as: COUMADIN          Where to get your medications    These are the prescriptions that you need to pick up. We sent them to a specific pharmacy, so you will need to go there to get them.   [id number] [place] - [address]    [address] [place] [address]    Phone: [phone]        furosemide 80 MG tablet   moxifloxacin 400 MG tablet   potassium chloride SA 20 MEQ tablet           Outstanding Labs/Studies: None  Duration of Discharge Encounter: 40 minutes including physician time.  Signed, R. Hurman Horn, PA-C 08/21/2011, 12:57 PM

## 2011-08-21 NOTE — Discharge Instructions (Signed)
PLEASE REMEMBER TO BRING ALL OF YOUR MEDICATIONS TO EACH OF YOUR FOLLOW-UP OFFICE VISITS.  PLEASE TAKE EACH MEDICATION AS PRESCRIBED.

## 2011-08-22 ENCOUNTER — Other Ambulatory Visit: Payer: Self-pay | Admitting: Cardiology

## 2011-08-22 NOTE — Telephone Encounter (Signed)
Please return call to patient at hm#  [phone]  Patient needs to speak with nurse regarding $300 antibiotic that he is unable to afford.   This RX was prescribed upon discharge from the hospital.   Please return call to advise as he will not be able to pick up this RX.  Patient wants to know if he can prescribe something different. He can be reached at hm# [phone].

## 2011-08-22 NOTE — Telephone Encounter (Signed)
Pt scheduled for PT/INR  08/25/11

## 2011-08-22 NOTE — Telephone Encounter (Signed)
Pt had been prescribed moxifloxacin. Pt states it was going to cost him more than $300. Dr Shirlee Latch prescribed levaquin 500mg  daily x 11 days. Pt is aware.

## 2011-08-24 LAB — CULTURE, BLOOD (ROUTINE X 2)
Culture  Setup Time: 201302252319
Culture: NO GROWTH
Culture: NO GROWTH

## 2011-08-25 ENCOUNTER — Telehealth: Payer: Self-pay | Admitting: *Deleted

## 2011-08-25 NOTE — Telephone Encounter (Signed)
no answer or machine to lm on home #, lmom on cell # lab negative. [name]

## 2011-08-25 NOTE — Telephone Encounter (Signed)
Message copied by Tarri Fuller on Mon Aug 25, 2011  9:56 AM ------      Message from: [place], [place] T      Created: Sun Aug 24, 2011  9:50 PM       Blood cultures from the his hospital stay were negative      Tereso Newcomer, PA-C  9:50 PM 08/24/2011

## 2011-08-27 ENCOUNTER — Other Ambulatory Visit: Payer: Medicare Other

## 2011-08-27 ENCOUNTER — Ambulatory Visit: Payer: Medicare Other | Admitting: Physician Assistant

## 2011-09-01 ENCOUNTER — Encounter: Payer: Self-pay | Admitting: Nurse Practitioner

## 2011-09-01 ENCOUNTER — Ambulatory Visit (INDEPENDENT_AMBULATORY_CARE_PROVIDER_SITE_OTHER): Payer: Medicare Other | Admitting: Nurse Practitioner

## 2011-09-01 ENCOUNTER — Other Ambulatory Visit: Payer: Self-pay | Admitting: *Deleted

## 2011-09-01 ENCOUNTER — Ambulatory Visit (INDEPENDENT_AMBULATORY_CARE_PROVIDER_SITE_OTHER): Payer: Medicare Other

## 2011-09-01 ENCOUNTER — Ambulatory Visit (INDEPENDENT_AMBULATORY_CARE_PROVIDER_SITE_OTHER): Payer: Medicare Other | Admitting: *Deleted

## 2011-09-01 DIAGNOSIS — Z7901 Long term (current) use of anticoagulants: Secondary | ICD-10-CM | POA: Diagnosis not present

## 2011-09-01 DIAGNOSIS — J189 Pneumonia, unspecified organism: Secondary | ICD-10-CM | POA: Diagnosis not present

## 2011-09-01 DIAGNOSIS — I5032 Chronic diastolic (congestive) heart failure: Secondary | ICD-10-CM | POA: Insufficient documentation

## 2011-09-01 DIAGNOSIS — I4821 Permanent atrial fibrillation: Secondary | ICD-10-CM

## 2011-09-01 DIAGNOSIS — I252 Old myocardial infarction: Secondary | ICD-10-CM | POA: Diagnosis not present

## 2011-09-01 DIAGNOSIS — I10 Essential (primary) hypertension: Secondary | ICD-10-CM

## 2011-09-01 DIAGNOSIS — I25810 Atherosclerosis of coronary artery bypass graft(s) without angina pectoris: Secondary | ICD-10-CM | POA: Diagnosis not present

## 2011-09-01 DIAGNOSIS — I359 Nonrheumatic aortic valve disorder, unspecified: Secondary | ICD-10-CM | POA: Diagnosis not present

## 2011-09-01 DIAGNOSIS — I509 Heart failure, unspecified: Secondary | ICD-10-CM

## 2011-09-01 DIAGNOSIS — I5030 Unspecified diastolic (congestive) heart failure: Secondary | ICD-10-CM

## 2011-09-01 DIAGNOSIS — I739 Peripheral vascular disease, unspecified: Secondary | ICD-10-CM

## 2011-09-01 DIAGNOSIS — I4891 Unspecified atrial fibrillation: Secondary | ICD-10-CM

## 2011-09-01 LAB — BASIC METABOLIC PANEL
BUN: 19 mg/dL (ref 6–23)
CO2: 33 mEq/L — ABNORMAL HIGH (ref 19–32)
Calcium: 9.7 mg/dL (ref 8.4–10.5)
Chloride: 96 mEq/L (ref 96–112)
Creatinine, Ser: 1.1 mg/dL (ref 0.4–1.5)
GFR: 66.7 mL/min (ref >60.00)
Glucose, Bld: 153 mg/dL — ABNORMAL HIGH (ref 70–99)
Potassium: 4.8 mEq/L (ref 3.5–5.1)
Sodium: 134 mEq/L — ABNORMAL LOW (ref 135–145)

## 2011-09-01 LAB — POCT INR: INR: 3.2

## 2011-09-01 NOTE — Assessment & Plan Note (Signed)
He has chronic atrial fib. He is on coumadin. We are checking INR today. No adverse effects noted.

## 2011-09-01 NOTE — Assessment & Plan Note (Signed)
Blood pressure is ok at home. No change with his current medicines. I have encouraged him to continue to monitor.

## 2011-09-01 NOTE — Assessment & Plan Note (Signed)
Has had recent echo showing EF 55 to 60%, no wall motion abnormaility, moderate LVH, visualized bioprosthetic AV, mild MS/MR, moderately dilated LA and mildly dilated RV/RA with moderate to severe pulmonary arterial systolic pressure. He looks good clinically today. No change in his current medicines. We are rechecking a BMET today. We will see him back for his regular appointment next month with Dr. Shirlee Latch. Patient is agreeable to this plan and will call if any problems develop in the interim.

## 2011-09-01 NOTE — Patient Instructions (Addendum)
We are going to check some lab today.  Finish your antibiotic.  Stay on your regular medicines.   Keep your April appointment with Dr. Shirlee Latch.  Call the [facility] office at [phone] if you have any questions, problems or concerns.

## 2011-09-01 NOTE — Progress Notes (Signed)
[name] Date of Birth: 02/14/1935 Medical Record #[id number]  History of Present Illness: [name] is seen back today for a post hospital visit. He is seen for Dr. Shirlee Latch. He is a 77 year old male with multiple medical issues. He has a history of AS with prior redo AVR, diastolic CHF, CAD, chronic atrial fib, chronic coumadin. Last cath was in July of 2012 showing stable disease with patent grafts. He was recently admitted with pneumonia. He is finishing a course of Avelox. He is feeling better. Has no complaint. Denies chest pain. Not short of breath. No cough or fevers reported. He feels ok on his medicines. Blood pressure is good at home. He has no complaint today. He was having some abdominal pain but had a negative ultrasound.   Current Outpatient Prescriptions on File Prior to Visit  Medication Sig Dispense Refill  . aspirin EC 81 MG EC tablet Take 1 tablet (81 mg total) by mouth daily.    0  . ezetimibe-simvastatin (VYTORIN) 10-40 MG per tablet Take 1 tablet by mouth at bedtime.       . insulin glargine (LANTUS) 100 UNIT/ML injection Inject 20-70 Units into the skin 2 (two) times daily. 20 units in the morning; 70 units at bedtime       . insulin glulisine (APIDRA) 100 UNIT/ML injection Inject 0-14 Units into the skin 3 (three) times daily before meals. Pt uses home sliding scale based on cbg      . lisinopril (PRINIVIL,ZESTRIL) 5 MG tablet Take 1 tablet (5 mg total) by mouth daily.  30 tablet  6  . metFORMIN (GLUCOPHAGE-XR) 500 MG 24 hr tablet Take 500 mg by mouth 2 (two) times daily.      . metoprolol tartrate (LOPRESSOR) 25 MG tablet Take 12.5 mg by mouth 2 (two) times daily.      . potassium chloride SA (K-DUR,KLOR-CON) 20 MEQ tablet Take 1 tablet (20 mEq total) by mouth 2 (two) times daily.  60 tablet  3  . spironolactone (ALDACTONE) 25 MG tablet Take 1 tablet (25 mg total) by mouth daily.  90 tablet  3  . warfarin (COUMADIN) 5 MG tablet Take 5-10 mg by mouth daily. 5mg   tablet Sunday, Monday, Tuesday, Thursday, Saturday; 10mg  Wednesday and Friday         Allergies  Allergen Reactions  . Metoprolol Succinate   . Pioglitazone     REACTION: edema  . Potassium-Containing Compounds     IV--loss of conciousness    Past Medical History  Diagnosis Date  . Erectile dysfunction   . Obesity   . OSA (obstructive sleep apnea)     not using CPAP  . History of nephrolithiasis   . HTN (hypertension)   . Aortal stenosis 2006    biophrostetic  . Atrial fibrillation     onset after CABG--- [facility]  . S/P aortic valve replacement   . CHF (congestive heart failure)     abter CABG  . Pacemaker 2/07    VVI  . PVD (peripheral vascular disease)     CT angio showed- vascular insuff, intestine and RAS bilaterally, andgiogram 02/2009 severe PVD medical management  . Nephrolithiasis   . Skin cancer     surgery (nose) 09-2010  . Sick sinus syndrome     [name]  . Hyperlipidemia   . Arthritis   . Heart murmur   . Ulcer   . Gangrene   . Shortness of breath   .  Coronary artery disease   . DM type 2 (diabetes mellitus, type 2)     used to see Dr Roanna Raider, now f/u by Dr Drue Novel    Past Surgical History  Procedure Date  . Aortic valve replacement     biophrostetic for Ao Stenosis 2006  . Coronary artery bypass graft 2006  . Tonsillectomy   . Cataract extraction, bilateral   . Amputation 01/2010    great toe  . Pacemaker placement 07/2005    Medtronic  . Aortic valve replacement 01/17/2011    S/P redo median sternotomy, extracorporeal circulation, redo Aortic Valve Replacement using a 23-mm Edwards pericardial Magna-Ease valve, Dr Concepcion Living  . Angioplasty / stenting femoral 02/10/10    Left femoral    History  Smoking status  . Former Smoker  . Types: Cigarettes  . Quit date: 06/23/1988  Smokeless tobacco  . Former User    History  Alcohol Use No    Family History  Problem Relation Age of Onset  .  Heart attack Father 44  . Stroke Mother 30  . Heart disease Sister 78     also stomach aneurysm  . Cancer Neg Hx     colon, prostate  . Diabetes Sister     and/or heart problems  . Diabetes Sister     and/or heart problems  . Diabetes Sister     and/or heart problems    Review of Systems: The review of systems is per the HPI.  All other systems were reviewed and are negative.  Physical Exam: BP 140/68  Pulse 88  Ht 5\' 8"  (1.727 m)  Wt 253 lb (114.76 kg)  BMI 38.47 kg/m2 Patient is very pleasant and in no acute distress. Skin is warm and dry. Color is normal.  HEENT is unremarkable. Normocephalic/atraumatic. PERRL. Sclera are nonicteric. Neck is supple. No masses. No JVD. Lungs are clear. Cardiac exam shows an irregular rhythm. Rate is controlled.  Abdomen is obese but soft. Extremities are without edema. Gait and ROM are intact. No gross neurologic deficits noted.  LABORATORY DATA: Lab Results  Component Value Date   WBC 13.2* 08/21/2011   HGB 13.6 08/21/2011   HCT 43.8 08/21/2011   PLT 334 08/21/2011   GLUCOSE 118* 08/21/2011   CHOL 137 06/12/2011   TRIG 63.0 06/12/2011   HDL 37.90* 06/12/2011   LDLCALC 87 06/12/2011   ALT 18 08/18/2011   AST 29 08/18/2011   NA 136 08/21/2011   K 4.0 08/21/2011   CL 91* 08/21/2011   CREATININE 1.06 08/21/2011   BUN 21 08/21/2011   CO2 34* 08/21/2011   TSH 0.400 08/18/2011   PSA 1.32 10/10/2010   INR 3.2 09/01/2011   HGBA1C 8.2* 08/18/2011   MICROALBUR 5.0* 12/04/2009    Dg Chest 2 View  08/19/2011  *RADIOLOGY REPORT*  Clinical Data: Cough and dyspnea.  CHEST - 2 VIEW  Comparison: 02/11/2011.  Findings: Stable surgical changes from bypass and valve replacement surgery.  The right ventricular pacer wire is unchanged. There are underlying chronic bronchitic type changes with superimposed bilateral lower lobe airspace opacities which is likely pneumonia. Asymmetric perihilar edema is also possible but less likely.  Small pleural effusions are noted.   No pneumothorax.  IMPRESSION:  1.  Stable cardiac enlargement and chronic underlying bronchitic type lung changes. 2.  Perihilar infiltrates or less likely asymmetric edema. 3.  Small bilateral pleural effusions.  Original Report Authenticated By: P. Loralie Champagne, M.D.    Assessment / Plan:

## 2011-09-01 NOTE — Assessment & Plan Note (Signed)
He has planned follow up with [facility] next Monday.

## 2011-09-01 NOTE — Assessment & Plan Note (Signed)
He is doing well clinically. He is almost finished with his antibiotics.

## 2011-09-11 ENCOUNTER — Encounter (INDEPENDENT_AMBULATORY_CARE_PROVIDER_SITE_OTHER): Payer: Medicare Other | Admitting: *Deleted

## 2011-09-11 DIAGNOSIS — Z48812 Encounter for surgical aftercare following surgery on the circulatory system: Secondary | ICD-10-CM | POA: Diagnosis not present

## 2011-09-11 DIAGNOSIS — I739 Peripheral vascular disease, unspecified: Secondary | ICD-10-CM

## 2011-09-15 ENCOUNTER — Encounter: Payer: Self-pay | Admitting: Internal Medicine

## 2011-09-15 ENCOUNTER — Ambulatory Visit (INDEPENDENT_AMBULATORY_CARE_PROVIDER_SITE_OTHER): Payer: Medicare Other | Admitting: *Deleted

## 2011-09-15 ENCOUNTER — Encounter: Payer: Self-pay | Admitting: Surgery

## 2011-09-15 ENCOUNTER — Other Ambulatory Visit: Payer: Self-pay | Admitting: *Deleted

## 2011-09-15 DIAGNOSIS — I739 Peripheral vascular disease, unspecified: Secondary | ICD-10-CM

## 2011-09-15 DIAGNOSIS — Z48812 Encounter for surgical aftercare following surgery on the circulatory system: Secondary | ICD-10-CM

## 2011-09-15 DIAGNOSIS — Z7901 Long term (current) use of anticoagulants: Secondary | ICD-10-CM | POA: Diagnosis not present

## 2011-09-15 DIAGNOSIS — I359 Nonrheumatic aortic valve disorder, unspecified: Secondary | ICD-10-CM | POA: Diagnosis not present

## 2011-09-15 DIAGNOSIS — I495 Sick sinus syndrome: Secondary | ICD-10-CM

## 2011-09-15 NOTE — Patient Instructions (Signed)
consistency with leafy green vegetable intake

## 2011-09-15 NOTE — Procedures (Unsigned)
LOWER EXTREMITY ARTERIAL EVALUATION-SINGLE LEVEL  INDICATION:  Peripheral vascular disease.  HISTORY: Diabetes:  Yes Cardiac:  CHF, CABG and CAD Hypertension:  Yes Smoking:  Previous Previous Surgery:  Left superficial femoral artery stent with PTA Of the anterior tibial artery on 02/10/2010; left great toe amputation 02/14/2010.  RESTING SYSTOLIC PRESSURES: (ABI)                         RIGHT                LEFT Brachial: Anterior tibial: Posterior tibial: Peroneal: DOPPLER WAVEFORM ANALYSIS: Anterior tibial: Posterior tibial: Peroneal:  PREVIOUS ABI'S:  Date: 03/10/2011  RIGHT:  2.40  LEFT:  0.91  Date: 09/11/2011  RIGHT:  Noncompressible bilaterally.  DUPLEX:  Biphasic and triphasic waveforms throughout the left lower extremity with velocities within normal limits.  Unable to obtain an increased velocity as noted on prior study of 03/10/2011.  Monophasic waveform is noted in the distal posterior tibial on the left.  IMPRESSION: 1. Patent left femoral artery stent. 2. Probable runoff disease on the left.  ___________________________________________ V. Charlena Cross, MD  SS/MEDQ  D:  09/11/2011  T:  09/11/2011  Job:  [id number]

## 2011-09-30 ENCOUNTER — Ambulatory Visit (INDEPENDENT_AMBULATORY_CARE_PROVIDER_SITE_OTHER): Payer: Medicare Other | Admitting: *Deleted

## 2011-09-30 ENCOUNTER — Encounter (HOSPITAL_BASED_OUTPATIENT_CLINIC_OR_DEPARTMENT_OTHER): Payer: Medicare Other | Attending: General Surgery

## 2011-09-30 DIAGNOSIS — S98119A Complete traumatic amputation of unspecified great toe, initial encounter: Secondary | ICD-10-CM | POA: Insufficient documentation

## 2011-09-30 DIAGNOSIS — I4891 Unspecified atrial fibrillation: Secondary | ICD-10-CM | POA: Insufficient documentation

## 2011-09-30 DIAGNOSIS — Z79899 Other long term (current) drug therapy: Secondary | ICD-10-CM | POA: Diagnosis not present

## 2011-09-30 DIAGNOSIS — E1149 Type 2 diabetes mellitus with other diabetic neurological complication: Secondary | ICD-10-CM | POA: Insufficient documentation

## 2011-09-30 DIAGNOSIS — E1142 Type 2 diabetes mellitus with diabetic polyneuropathy: Secondary | ICD-10-CM | POA: Insufficient documentation

## 2011-09-30 DIAGNOSIS — I10 Essential (primary) hypertension: Secondary | ICD-10-CM | POA: Insufficient documentation

## 2011-09-30 DIAGNOSIS — Z954 Presence of other heart-valve replacement: Secondary | ICD-10-CM | POA: Insufficient documentation

## 2011-09-30 DIAGNOSIS — I2510 Atherosclerotic heart disease of native coronary artery without angina pectoris: Secondary | ICD-10-CM | POA: Insufficient documentation

## 2011-09-30 DIAGNOSIS — I359 Nonrheumatic aortic valve disorder, unspecified: Secondary | ICD-10-CM | POA: Diagnosis not present

## 2011-09-30 DIAGNOSIS — Z7901 Long term (current) use of anticoagulants: Secondary | ICD-10-CM | POA: Insufficient documentation

## 2011-09-30 DIAGNOSIS — L84 Corns and callosities: Secondary | ICD-10-CM | POA: Diagnosis not present

## 2011-09-30 DIAGNOSIS — Z794 Long term (current) use of insulin: Secondary | ICD-10-CM | POA: Diagnosis not present

## 2011-09-30 DIAGNOSIS — Z950 Presence of cardiac pacemaker: Secondary | ICD-10-CM | POA: Diagnosis not present

## 2011-09-30 LAB — POCT INR: INR: 1.5

## 2011-09-30 NOTE — Progress Notes (Signed)
[facility]  NAMEJOHNTAY, DOOLEN                ACCOUNT NO.:  [id number]  MEDICAL RECORD NO.:  [id number]      DATE OF BIRTH:  August 18, 1934  PHYSICIAN:  Joanne Gavel, M.D.         VISIT DATE:  09/30/2011                                  OFFICE VISIT   CHIEF COMPLAINT:  Callus, right heel.  HISTORY OF PRESENT ILLNESS:  This is a type 2 diabetic, recently discharged from this clinic after losing his left great toe to diabetic foot ulcer.  He comes with a painless callus of the right heel with a visible crack.  PAST MEDICAL HISTORY:  Significant for heart failure, diabetes mellitus, hypertension, coronary artery disease, history of aortic valve replacement, and permanent atrial fibrillation.  PAST SURGICAL HISTORY:  Includes open heart surgery x2, amputation left great toe, pacemaker, cataracts surgery, and stents.  CURRENT MEDICATIONS:  Include Lasix, Avelox, potassium chloride, aspirin, Vytorin, insulin Apidra, lisinopril metformin, metoprolol, spironolactone, and Coumadin.  ALLERGIES:  METOPROLOL, PIOGLITAZONE.  SOCIAL HISTORY:  Cigarettes, none.  Alcohol, none.  REVIEW OF SYSTEMS:  As above.  PHYSICAL EXAMINATION:  VITAL SIGNS:  Temperature 98, pulse 60 and irregular, respirations 16, blood pressure 146/80, and glucose is 67. GENERAL APPEARANCE:  Well developed, well nourished, in no distress. CRANIUM:  Normocephalic.  Normal symmetrical strength. ABDOMEN:  Not examined. CHEST:  Clear. HEART:  Irregular rhythm. EXTREMITIES:  Examination of extremities revealed very poor peripheral pulses.  There is a callus on the right heel.  After debridement, there is no open wound.  ADMITTING IMPRESSION:  Diabetes mellitus with some neuropathy.  PLAN OF TREATMENT:  His right heel, he should examine his feet every day.  We will see him p.r.n.     Joanne Gavel, M.D.     RA/MEDQ  D:  09/30/2011  T:  09/30/2011  Job:  [id number]

## 2011-10-14 ENCOUNTER — Ambulatory Visit (INDEPENDENT_AMBULATORY_CARE_PROVIDER_SITE_OTHER): Payer: Medicare Other | Admitting: Pharmacist

## 2011-10-14 DIAGNOSIS — I359 Nonrheumatic aortic valve disorder, unspecified: Secondary | ICD-10-CM | POA: Diagnosis not present

## 2011-10-14 DIAGNOSIS — Z7901 Long term (current) use of anticoagulants: Secondary | ICD-10-CM | POA: Diagnosis not present

## 2011-10-14 LAB — POCT INR: INR: 2

## 2011-10-20 ENCOUNTER — Ambulatory Visit: Payer: Medicare Other | Admitting: Cardiology

## 2011-10-30 ENCOUNTER — Ambulatory Visit (INDEPENDENT_AMBULATORY_CARE_PROVIDER_SITE_OTHER): Payer: Medicare Other

## 2011-10-30 DIAGNOSIS — I359 Nonrheumatic aortic valve disorder, unspecified: Secondary | ICD-10-CM

## 2011-10-30 DIAGNOSIS — Z7901 Long term (current) use of anticoagulants: Secondary | ICD-10-CM

## 2011-10-30 DIAGNOSIS — I10 Essential (primary) hypertension: Secondary | ICD-10-CM | POA: Diagnosis not present

## 2011-10-30 DIAGNOSIS — G609 Hereditary and idiopathic neuropathy, unspecified: Secondary | ICD-10-CM | POA: Diagnosis not present

## 2011-11-04 ENCOUNTER — Encounter (HOSPITAL_BASED_OUTPATIENT_CLINIC_OR_DEPARTMENT_OTHER): Payer: Medicare Other | Attending: General Surgery

## 2011-11-04 DIAGNOSIS — E1142 Type 2 diabetes mellitus with diabetic polyneuropathy: Secondary | ICD-10-CM | POA: Insufficient documentation

## 2011-11-04 DIAGNOSIS — L84 Corns and callosities: Secondary | ICD-10-CM | POA: Insufficient documentation

## 2011-11-04 DIAGNOSIS — E1149 Type 2 diabetes mellitus with other diabetic neurological complication: Secondary | ICD-10-CM | POA: Insufficient documentation

## 2011-11-04 DIAGNOSIS — S98119A Complete traumatic amputation of unspecified great toe, initial encounter: Secondary | ICD-10-CM | POA: Insufficient documentation

## 2011-11-04 DIAGNOSIS — I2510 Atherosclerotic heart disease of native coronary artery without angina pectoris: Secondary | ICD-10-CM | POA: Insufficient documentation

## 2011-11-04 DIAGNOSIS — Z954 Presence of other heart-valve replacement: Secondary | ICD-10-CM | POA: Insufficient documentation

## 2011-11-04 DIAGNOSIS — I10 Essential (primary) hypertension: Secondary | ICD-10-CM | POA: Insufficient documentation

## 2011-11-04 DIAGNOSIS — Z7901 Long term (current) use of anticoagulants: Secondary | ICD-10-CM | POA: Insufficient documentation

## 2011-11-04 DIAGNOSIS — Z79899 Other long term (current) drug therapy: Secondary | ICD-10-CM | POA: Insufficient documentation

## 2011-11-04 DIAGNOSIS — Z794 Long term (current) use of insulin: Secondary | ICD-10-CM | POA: Insufficient documentation

## 2011-11-04 DIAGNOSIS — I4891 Unspecified atrial fibrillation: Secondary | ICD-10-CM | POA: Insufficient documentation

## 2011-11-04 DIAGNOSIS — Z950 Presence of cardiac pacemaker: Secondary | ICD-10-CM | POA: Insufficient documentation

## 2011-12-11 ENCOUNTER — Ambulatory Visit (INDEPENDENT_AMBULATORY_CARE_PROVIDER_SITE_OTHER): Payer: Medicare Other

## 2011-12-11 ENCOUNTER — Encounter: Payer: Self-pay | Admitting: Internal Medicine

## 2011-12-11 ENCOUNTER — Ambulatory Visit (INDEPENDENT_AMBULATORY_CARE_PROVIDER_SITE_OTHER): Payer: Medicare Other | Admitting: Cardiology

## 2011-12-11 ENCOUNTER — Encounter: Payer: Self-pay | Admitting: Cardiology

## 2011-12-11 ENCOUNTER — Ambulatory Visit (INDEPENDENT_AMBULATORY_CARE_PROVIDER_SITE_OTHER): Payer: Medicare Other | Admitting: *Deleted

## 2011-12-11 VITALS — BP 158/62 | HR 71 | Ht 68.0 in | Wt 239.8 lb

## 2011-12-11 DIAGNOSIS — Z954 Presence of other heart-valve replacement: Secondary | ICD-10-CM

## 2011-12-11 DIAGNOSIS — Z7901 Long term (current) use of anticoagulants: Secondary | ICD-10-CM

## 2011-12-11 DIAGNOSIS — I6529 Occlusion and stenosis of unspecified carotid artery: Secondary | ICD-10-CM

## 2011-12-11 DIAGNOSIS — I509 Heart failure, unspecified: Secondary | ICD-10-CM | POA: Diagnosis not present

## 2011-12-11 DIAGNOSIS — E785 Hyperlipidemia, unspecified: Secondary | ICD-10-CM

## 2011-12-11 DIAGNOSIS — I359 Nonrheumatic aortic valve disorder, unspecified: Secondary | ICD-10-CM | POA: Diagnosis not present

## 2011-12-11 DIAGNOSIS — Z951 Presence of aortocoronary bypass graft: Secondary | ICD-10-CM | POA: Diagnosis not present

## 2011-12-11 DIAGNOSIS — I5030 Unspecified diastolic (congestive) heart failure: Secondary | ICD-10-CM | POA: Diagnosis not present

## 2011-12-11 DIAGNOSIS — I442 Atrioventricular block, complete: Secondary | ICD-10-CM | POA: Diagnosis not present

## 2011-12-11 DIAGNOSIS — R0602 Shortness of breath: Secondary | ICD-10-CM | POA: Diagnosis not present

## 2011-12-11 DIAGNOSIS — I4821 Permanent atrial fibrillation: Secondary | ICD-10-CM

## 2011-12-11 DIAGNOSIS — I7389 Other specified peripheral vascular diseases: Secondary | ICD-10-CM

## 2011-12-11 DIAGNOSIS — I4891 Unspecified atrial fibrillation: Secondary | ICD-10-CM

## 2011-12-11 LAB — LIPID PANEL
Cholesterol: 110 mg/dL (ref 0–200)
Triglycerides: 84 mg/dL (ref 0.0–149.0)

## 2011-12-11 LAB — BASIC METABOLIC PANEL
BUN: 20 mg/dL (ref 6–23)
Chloride: 99 mEq/L (ref 96–112)
Creatinine, Ser: 1 mg/dL (ref 0.4–1.5)
Glucose, Bld: 199 mg/dL — ABNORMAL HIGH (ref 70–99)
Potassium: 3.6 mEq/L (ref 3.5–5.1)

## 2011-12-11 MED ORDER — FUROSEMIDE 80 MG PO TABS: ORAL_TABLET | ORAL | Status: DC

## 2011-12-11 NOTE — Patient Instructions (Addendum)
Your physician wants you to follow-up in: 6 months with Dr. Johney Frame.  You will receive a reminder letter in the mail two months in advance. If you don't receive a letter, please call our office to schedule the follow-up appointment.  Your physician has requested that you have a carotid duplex this month or early July. This test is an ultrasound of the carotid arteries in your neck. It looks at blood flow through these arteries that supply the brain with blood. Allow one hour for this exam. There are no restrictions or special instructions.  Take Lasix as follows:  160mg  everyday in the morning.  In addition, take and extra 80mg  every other day about 4PM.  LABS TODAY:  Lipids, BMET and BNP.  Your physician recommends that you return for lab work in: 2 weeks - BMET, BNP.  Your physician recommends that you schedule a follow-up appointment in: 1 months with Dr. Shirlee Latch.

## 2011-12-11 NOTE — Progress Notes (Signed)
Pacer check in clinic  

## 2011-12-12 NOTE — Assessment & Plan Note (Signed)
Followed at [facility].  Seen there regularly.  

## 2011-12-12 NOTE — Assessment & Plan Note (Signed)
Patient appears more volume overloaded on exam than prior.  NYHA class III symptoms.  I will have him increase Lasix to 160 qam with 80 mg qpm.  The pm dose will be taken every other day.  BMET/BNP in 2 wks, followup in 1 month.

## 2011-12-12 NOTE — Assessment & Plan Note (Signed)
Last echo showed normal function of bioprosthetic aortic valve.  

## 2011-12-12 NOTE — Progress Notes (Signed)
Patient ID: [name], [name]   DOB: Jun 25, 1934, 77 y.o.   MRN: [id number] PCP: Dr. Drue Novel  77 yo with history of AS s/p redo AVR, diastolic CHF, CAD, chronic atrial fibrillation, s/p pacemaker presents for cardiology followup.  Patient had redo AVR with #23 Edwards pericardial valve in 7/12.  Cath prior to surgery showed stable disease with patent grafts.  He follows with [facility] for [facility].   Lately, [name] feels like he has been holding onto fluid.  His legs have been swelling more and he has significant fatigue.  He is still working at [employer] and does not have dyspnea if he walks slowly, but he is short of breath if he walks fast or up an incline.  No orthopnea or PND.  No chest pain.  He has OSA but has never been able to tolerate CPAP.  He remains in chronic atrial fibrillation.  BP is high this morning but he has not taken any of his meds.  He is currently taking Lasix 160 mg in the am and no Lasix in the afternoon. PCM was checked today and was functioning appropriately.   Labs (1/12): LDL 71, HDL 29, K 4.7, creatinine 1.1 Labs (4/12): K 4, creatinine 1.0, BNP 262 Labs (7/12): LDL 92, HDL 32 Labs (8/12): K 4.3, creatinine 1.15 => 1.1, BNP 174 Labs (9/12): K 4, creatinine 0.9, BNP 126 Labs (1/13): BNP 130, K 3.6, creatinine 1.5 Labs (3/13): K 4.8, creatinine 1.1  ECG: atrial fibrillation, V-paced  PMH:  1. Aortic stenosis: s/p bioprosthetic aortic valve replacement in 06/2004.  Echo in 7/11 showed mean gradient 35 mmHg across the bioprosthetic valve.  Echo in 6/12 showed worsening of the bioprosthetic mean gradient to 46 mmHg.  TEE in 7/12 showed only 1 cusp of the bioprosthetic aortic valve moving (heavily calcified), mean 52 mmHg/peak gradient 76 mmHg. Patient had redo AVR (7/12) with #23 Edwards pericardial valve.  Echo (8/12) post AVR showed EF 55%, inferior/inferoseptal HK, bioprosthetic aortic valve with mean gradient 16/peak 28, mild Seth, moderately dilated RV with mildly  decreased RV systolic function, PASP 68 mmHg.  2. CAD: CABG with AVR in 1/06.  Patient had SVG-OM and SVG-D.  LHC (7/12) showed 50% distal RCA lesion extending into PLV and PDA, total occlusion CFX, 50% pLAD, 90% ostial D1, patent SVG-D1 and SVG-OM.  3. Diabetes 4. Hyperlipidemia 5. HTN 6. Sick sinus syndrome with Medtronic dual chamber PCM 7. Obesity 8. OSA: Not using CPAP (cannot tolerate) 9. Chronic diastolic CHF: Echo in 7/11 showed EF 60-65% with moderate LV hypertrophy, moderate diastolic dysfunction, mild mitral stenosis, bioprosthetic aortic valve with mean gradient 35 mmHg (moderate stenosis) and PA systolic pressure 69 mmHg.   Echo (6/12) showed EF 60-65%, moderate LV hypertrophy, bioprosthetic aortic valve with mean gradient 46 mmHg (severe stenosis) and PA systolic pressure 74 mmHg, mild Seth.  10. Nephrolithiasis 11. PAD: Has seen Mc[facility]hany and Brabham.  Lower extremity angiography in 9/10 showed occlusion of all 3 vessels below the knees in both legs.  Patient then had L SFA stent and PTCA left AT by Dr. Myra Gianotti in 2011.  He had amputation of the left great toe in 8/11.  12. Atrial fibrillation: Chronic, on coumadin.  LAA thrombus noted on TEE in 7/12.  13. Carotid stenosis: carotid dopplers (7/11) with 40-59% bilateral ICA stenosis.  Carotids (6/12) with 40-59% bilateral ICA stenosis.  14. Beta blocker intolerance.   SH: Married, lives in [place].  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 Quit smoking in 1990 (smoked heavily prior to this).   FH: Father with MI at 30, mother with CVA at 32  ROS: All systems reviewed and negative except as per HPI.    Current Outpatient Prescriptions  Medication Sig Dispense Refill  . ezetimibe-simvastatin (VYTORIN) 10-40 MG per tablet Take 1 tablet by mouth at bedtime.       . furosemide (LASIX) 80 MG tablet Take 160mg  everyday PLUS an extra 80mg  every other day at 4PM  120 tablet  3  . insulin glargine (LANTUS) 100  UNIT/ML injection Inject 20-70 Units into the skin 2 (two) times daily. 20 units in the morning; 70 units at bedtime       . insulin glulisine (APIDRA) 100 UNIT/ML injection Inject 0-14 Units into the skin 3 (three) times daily before meals. Pt uses home sliding scale based on cbg      . lisinopril (PRINIVIL,ZESTRIL) 5 MG tablet Take 1 tablet (5 mg total) by mouth daily.  30 tablet  6  . metFORMIN (GLUCOPHAGE-XR) 500 MG 24 hr tablet Take 500 mg by mouth 2 (two) times daily.      . metoprolol tartrate (LOPRESSOR) 25 MG tablet Take 12.5 mg by mouth 2 (two) times daily.      . potassium chloride SA (K-DUR,KLOR-CON) 20 MEQ tablet Take 1 tablet (20 mEq total) by mouth 2 (two) times daily.  60 tablet  3  . spironolactone (ALDACTONE) 25 MG tablet Take 1 tablet (25 mg total) by mouth daily.  90 tablet  3  . warfarin (COUMADIN) 5 MG tablet Take 5-10 mg by mouth daily. 5mg  tablet Sunday, Monday, Tuesday, Thursday, Saturday; 10mg  Wednesday and Friday       . aspirin EC 81 MG EC tablet Take 1 tablet (81 mg total) by mouth daily.    0    BP 158/62  Pulse 71  Ht 5\' 8"  (1.727 m)  Wt 108.773 kg (239 lb 12.8 oz)  BMI 36.46 kg/m2 General: obese, NAD Neck: JVP 12 cm, no thyromegaly or thyroid nodule.  Lungs: Slight crackles at bases bilaterally. CV: Nondisplaced PMI.  Heart irregular S1/S2, no S3/S4, 2/6 early SEM.  1+ edema to knees bilaterally.  Unable to palpate pedal pulses.  Bilateral soft carotid bruits. Abdomen: Soft, nontender, no hepatosplenomegaly, no distention.  Neurologic: Alert and oriented x 3.  Psych: Normal affect. Extremities: No clubbing or cyanosis.

## 2011-12-12 NOTE — Assessment & Plan Note (Signed)
Chronic.  LAA thrombus noted on TEE prior to AVR.  Continue coumadin.  Should be bridged with heparin or Lovenox if he needs to come off coumadin in the future.  

## 2011-12-12 NOTE — Assessment & Plan Note (Signed)
Stable with patent grafts on 7/12 cath.  Continue ASA 81, Vytorin, ACEI., metoprolol.  He has had significant fatigue with beta blockers in the past and I wonder if the metoprolol, which apparently was started not long ago, may not be contributory to his recent increased fatigue.  If he is not significantly improved after increasing Lasix, will stop metoprolol.

## 2011-12-12 NOTE — Assessment & Plan Note (Signed)
Check lipids/LFTs today, goal LDL < 70.  

## 2011-12-12 NOTE — Assessment & Plan Note (Signed)
Needs carotid dopplers this month.

## 2011-12-15 DIAGNOSIS — G609 Hereditary and idiopathic neuropathy, unspecified: Secondary | ICD-10-CM | POA: Diagnosis not present

## 2011-12-15 DIAGNOSIS — I10 Essential (primary) hypertension: Secondary | ICD-10-CM | POA: Diagnosis not present

## 2011-12-18 ENCOUNTER — Other Ambulatory Visit: Payer: Self-pay | Admitting: Cardiology

## 2011-12-19 ENCOUNTER — Other Ambulatory Visit: Payer: Self-pay | Admitting: Cardiology

## 2011-12-22 LAB — PACEMAKER DEVICE OBSERVATION
BATTERY VOLTAGE: 2.77 V
BMOD-0001RV: LOW
BMOD-0004RV: 5
RV LEAD AMPLITUDE: 11.2 mv
RV LEAD IMPEDENCE PM: 636 Ohm

## 2011-12-26 ENCOUNTER — Other Ambulatory Visit: Payer: Medicare Other

## 2012-01-06 ENCOUNTER — Ambulatory Visit (INDEPENDENT_AMBULATORY_CARE_PROVIDER_SITE_OTHER): Payer: Medicare Other | Admitting: *Deleted

## 2012-01-06 ENCOUNTER — Encounter (INDEPENDENT_AMBULATORY_CARE_PROVIDER_SITE_OTHER): Payer: Medicare Other

## 2012-01-06 ENCOUNTER — Other Ambulatory Visit (INDEPENDENT_AMBULATORY_CARE_PROVIDER_SITE_OTHER): Payer: Medicare Other

## 2012-01-06 DIAGNOSIS — R0602 Shortness of breath: Secondary | ICD-10-CM

## 2012-01-06 DIAGNOSIS — I6529 Occlusion and stenosis of unspecified carotid artery: Secondary | ICD-10-CM | POA: Diagnosis not present

## 2012-01-06 DIAGNOSIS — I359 Nonrheumatic aortic valve disorder, unspecified: Secondary | ICD-10-CM | POA: Diagnosis not present

## 2012-01-06 DIAGNOSIS — Z7901 Long term (current) use of anticoagulants: Secondary | ICD-10-CM

## 2012-01-06 DIAGNOSIS — I509 Heart failure, unspecified: Secondary | ICD-10-CM

## 2012-01-07 ENCOUNTER — Telehealth: Payer: Self-pay | Admitting: *Deleted

## 2012-01-07 DIAGNOSIS — R0602 Shortness of breath: Secondary | ICD-10-CM

## 2012-01-07 DIAGNOSIS — I509 Heart failure, unspecified: Secondary | ICD-10-CM

## 2012-01-07 LAB — BASIC METABOLIC PANEL
CO2: 34 mEq/L — ABNORMAL HIGH (ref 19–32)
Chloride: 99 mEq/L (ref 96–112)
Glucose, Bld: 219 mg/dL — ABNORMAL HIGH (ref 70–99)
Potassium: 4.5 mEq/L (ref 3.5–5.1)
Sodium: 139 mEq/L (ref 135–145)

## 2012-01-07 NOTE — Telephone Encounter (Signed)
Review lasix dose/ due to bnp elevated.

## 2012-01-07 NOTE — Telephone Encounter (Signed)
Left message to call back, wanted to verify Lasix dose since it was increase at the last ov (6/20).

## 2012-01-08 NOTE — Telephone Encounter (Signed)
Discussed with DOD (Dr.Hochrein) and he advised that patient follow original instructions from Dr.McLean to increase lasix to 80mg  in the PM every other day. Dr.Hochrein also advised that he return for a repeat BNP and BMP in 1 week.  LMOM for patient to call back.

## 2012-01-08 NOTE — Telephone Encounter (Signed)
Called patient with correct dose of lasix. Will return for labs on 7/26.

## 2012-01-08 NOTE — Telephone Encounter (Signed)
Spoke with patient. He is currently taking Lasix 160mg  in the AM every day and 40mg  in the PM on M W F. Dr. Shirlee Latch had requested that he take extra lasix 80mg  every other day in the PM. Will review with DOD and call patient back.

## 2012-01-16 DIAGNOSIS — I495 Sick sinus syndrome: Secondary | ICD-10-CM | POA: Diagnosis not present

## 2012-01-20 ENCOUNTER — Ambulatory Visit (INDEPENDENT_AMBULATORY_CARE_PROVIDER_SITE_OTHER): Payer: Medicare Other | Admitting: Cardiology

## 2012-01-20 ENCOUNTER — Ambulatory Visit (INDEPENDENT_AMBULATORY_CARE_PROVIDER_SITE_OTHER): Payer: Medicare Other | Admitting: *Deleted

## 2012-01-20 ENCOUNTER — Encounter: Payer: Self-pay | Admitting: Cardiology

## 2012-01-20 VITALS — BP 130/70 | HR 74 | Ht 68.0 in | Wt 226.0 lb

## 2012-01-20 DIAGNOSIS — I359 Nonrheumatic aortic valve disorder, unspecified: Secondary | ICD-10-CM | POA: Diagnosis not present

## 2012-01-20 DIAGNOSIS — R413 Other amnesia: Secondary | ICD-10-CM

## 2012-01-20 DIAGNOSIS — R0602 Shortness of breath: Secondary | ICD-10-CM | POA: Diagnosis not present

## 2012-01-20 DIAGNOSIS — I5032 Chronic diastolic (congestive) heart failure: Secondary | ICD-10-CM

## 2012-01-20 DIAGNOSIS — I2510 Atherosclerotic heart disease of native coronary artery without angina pectoris: Secondary | ICD-10-CM

## 2012-01-20 DIAGNOSIS — R6889 Other general symptoms and signs: Secondary | ICD-10-CM

## 2012-01-20 DIAGNOSIS — I4891 Unspecified atrial fibrillation: Secondary | ICD-10-CM

## 2012-01-20 DIAGNOSIS — F0390 Unspecified dementia without behavioral disturbance: Secondary | ICD-10-CM | POA: Diagnosis not present

## 2012-01-20 DIAGNOSIS — Z7901 Long term (current) use of anticoagulants: Secondary | ICD-10-CM | POA: Diagnosis not present

## 2012-01-20 DIAGNOSIS — I4821 Permanent atrial fibrillation: Secondary | ICD-10-CM

## 2012-01-20 DIAGNOSIS — E785 Hyperlipidemia, unspecified: Secondary | ICD-10-CM

## 2012-01-20 DIAGNOSIS — I5030 Unspecified diastolic (congestive) heart failure: Secondary | ICD-10-CM

## 2012-01-20 DIAGNOSIS — Z954 Presence of other heart-valve replacement: Secondary | ICD-10-CM

## 2012-01-20 DIAGNOSIS — R5383 Other fatigue: Secondary | ICD-10-CM

## 2012-01-20 DIAGNOSIS — R5381 Other malaise: Secondary | ICD-10-CM

## 2012-01-20 DIAGNOSIS — I6529 Occlusion and stenosis of unspecified carotid artery: Secondary | ICD-10-CM

## 2012-01-20 DIAGNOSIS — I739 Peripheral vascular disease, unspecified: Secondary | ICD-10-CM

## 2012-01-20 LAB — BASIC METABOLIC PANEL
BUN: 22 mg/dL (ref 6–23)
CO2: 33 mEq/L — ABNORMAL HIGH (ref 19–32)
Chloride: 96 mEq/L (ref 96–112)
Glucose, Bld: 124 mg/dL — ABNORMAL HIGH (ref 70–99)
Potassium: 4.6 mEq/L (ref 3.5–5.1)

## 2012-01-20 LAB — BRAIN NATRIURETIC PEPTIDE: Pro B Natriuretic peptide (BNP): 200 pg/mL — ABNORMAL HIGH (ref 0.0–100.0)

## 2012-01-20 NOTE — Progress Notes (Signed)
Patient ID: [name], male   DOB: 22-May-1935, 77 y.o.   MRN: [id number] PCP: Dr. Drue Novel  77 yo with history of AS s/p redo AVR, diastolic CHF, CAD, chronic atrial fibrillation, s/p pacemaker presents for cardiology followup.  Patient had redo AVR with #23 Edwards pericardial valve in 7/12.  Cath prior to surgery showed stable disease with patent grafts.  He follows with [facility] for [facility].   At last appointment, Mr Hagos appeared volume overloaded so I increased his Lasix to 160 qam, with an extra dose of 80 mg Lasix qpm every other day.  His wife says that he has not been consistent about taking Lasix but weight is down 13 lbs and he is feeling better.  Dyspnea has improved, he is ok walking on flat ground.  However, he remains very fatigued in general.  Per his wife, he sleeps a lot and when he is not sleeping, he watches TV.  He misses his meds some days.  He seems to be depressed.  His wife also reports short-term memory trouble recently.  He has known OSA but cannot tolerate CPAP.    Labs (1/12): LDL 71, HDL 29, K 4.7, creatinine 1.1 Labs (4/12): K 4, creatinine 1.0, BNP 262 Labs (7/12): LDL 92, HDL 32 Labs (8/12): K 4.3, creatinine 1.15 => 1.1, BNP 174 Labs (9/12): K 4, creatinine 0.9, BNP 126 Labs (1/13): BNP 130, K 3.6, creatinine 1.5 Labs (3/13): K 4.8, creatinine 1.1 Labs (6/13): LDL 64 Labs (7/13): BNP 446  PMH:  1. Aortic stenosis: s/p bioprosthetic aortic valve replacement in 06/2004.  Echo in 7/11 showed mean gradient 35 mmHg across the bioprosthetic valve.  Echo in 6/12 showed worsening of the bioprosthetic mean gradient to 46 mmHg.  TEE in 7/12 showed only 1 cusp of the bioprosthetic aortic valve moving (heavily calcified), mean 52 mmHg/peak gradient 76 mmHg. Patient had redo AVR (7/12) with #23 Edwards pericardial valve.  Echo (8/12) post AVR showed EF 55%, inferior/inferoseptal HK, bioprosthetic aortic valve with mean gradient 16/peak 28, mild MR, moderately dilated RV with mildly  decreased RV systolic function, PASP 68 mmHg.  2. CAD: CABG with AVR in 1/06.  Patient had SVG-OM and SVG-D.  LHC (7/12) showed 50% distal RCA lesion extending into PLV and PDA, total occlusion CFX, 50% pLAD, 90% ostial D1, patent SVG-D1 and SVG-OM.  3. Diabetes 4. Hyperlipidemia 5. HTN 6. Sick sinus syndrome with Medtronic dual chamber PCM 7. Obesity 8. OSA: Not using CPAP (cannot tolerate) 9. Chronic diastolic CHF: Echo in 7/11 showed EF 60-65% with moderate LV hypertrophy, moderate diastolic dysfunction, mild mitral stenosis, bioprosthetic aortic valve with mean gradient 35 mmHg (moderate stenosis) and PA systolic pressure 69 mmHg.   Echo (6/12) showed EF 60-65%, moderate LV hypertrophy, bioprosthetic aortic valve with mean gradient 46 mmHg (severe stenosis) and PA systolic pressure 74 mmHg, mild MR.  10. Nephrolithiasis 11. PAD: Has seen Mc[facility]hany and Brabham.  Lower extremity angiography in 9/10 showed occlusion of all 3 vessels below the knees in both legs.  Patient then had L SFA stent and PTCA left AT by Dr. Myra Gianotti in 2011.  He had amputation of the left great toe in 8/11.  12. Atrial fibrillation: Chronic, on coumadin.  LAA thrombus noted on TEE in 7/12.  13. Carotid stenosis: carotid dopplers (7/11) with 40-59% bilateral ICA stenosis.  Carotids (6/12) with 40-59% bilateral ICA stenosis.  Carotids (7/13) with 40-59% bilateral ICA stenosis.  14. Beta blocker intolerance.   SH: Married, lives  in [place].  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   FH: Father with MI at 45, mother with CVA at 40  ROS: All systems reviewed and negative except as per HPI.    Current Outpatient Prescriptions  Medication Sig Dispense Refill  . aspirin EC 81 MG tablet Take 81 mg by mouth daily.      Marland Kitchen ezetimibe-simvastatin (VYTORIN) 10-40 MG per tablet Take 1 tablet by mouth at bedtime.       . furosemide (LASIX) 80 MG tablet  Take 160mg  everyday PLUS an extra 80mg  every other day at 4PM  120 tablet  3  . insulin glargine (LANTUS) 100 UNIT/ML injection Inject 20-70 Units into the skin 2 (two) times daily. 20 units in the morning; 70 units at bedtime       . insulin glulisine (APIDRA) 100 UNIT/ML injection Inject 0-14 Units into the skin 3 (three) times daily before meals. Pt uses home sliding scale based on cbg      . lisinopril (PRINIVIL,ZESTRIL) 5 MG tablet Take 1 tablet (5 mg total) by mouth daily.  30 tablet  6  . metFORMIN (GLUCOPHAGE-XR) 500 MG 24 hr tablet Take 500 mg by mouth 2 (two) times daily.      . potassium chloride SA (K-DUR,KLOR-CON) 20 MEQ tablet Take 1 tablet (20 mEq total) by mouth 2 (two) times daily.  60 tablet  3  . spironolactone (ALDACTONE) 25 MG tablet Take 1 tablet (25 mg total) by mouth daily.  90 tablet  3  . warfarin (COUMADIN) 5 MG tablet take 1 tablet by mouth once daily  120 tablet  1  . DISCONTD: aspirin EC 81 MG EC tablet Take 1 tablet (81 mg total) by mouth daily.    0    BP 130/70  Pulse 74  Ht 5\' 8"  (1.727 m)  Wt 226 lb (102.513 kg)  BMI 34.36 kg/m2  SpO2 93% General: obese, NAD Neck: JVP 8 cm, no thyromegaly or thyroid nodule.  Lungs: Slight crackles at bases bilaterally. CV: Nondisplaced PMI.  Heart irregular S1/S2, no S3/S4, 2/6 early SEM.  1+ ankle edema bilaterally.  Unable to palpate pedal pulses.  Bilateral soft carotid bruits. Abdomen: Soft, nontender, no hepatosplenomegaly, no distention.  Neurologic: Alert and oriented x 3.  Psych: Normal affect. Extremities: No clubbing or cyanosis.

## 2012-01-20 NOTE — Assessment & Plan Note (Signed)
Last echo showed normal function of bioprosthetic aortic valve.

## 2012-01-20 NOTE — Assessment & Plan Note (Signed)
13 lb weight loss with higher Lasix dose.  Still volume overloaded but improved.  Wife is going to help him take Lasix as it has been ordered.  Will check BMET/BNP today.

## 2012-01-20 NOTE — Patient Instructions (Addendum)
Stop metoprolol.  Take lasix 160mg  in the morning every day. Take lasix 80 mg in the afternoon about 4PM every other afternoon.  Your physician recommends that you have  lab work today--BMET/BNP.  You have been referred to [facility].   Your physician recommends that you schedule a follow-up appointment in: 1 month with Dr Shirlee Latch.

## 2012-01-20 NOTE — Assessment & Plan Note (Signed)
Chronic.  LAA thrombus noted on TEE prior to AVR.  Continue coumadin.  Should be bridged with heparin or Lovenox if he needs to come off coumadin in the future.  

## 2012-01-20 NOTE — Assessment & Plan Note (Signed)
LDL at goal (< 70) in 6/13.

## 2012-01-20 NOTE — Assessment & Plan Note (Signed)
Stable with patent grafts on 7/12 cath.  Continue ASA 81, Vytorin, ACEI.

## 2012-01-20 NOTE — Assessment & Plan Note (Signed)
Followed at [facility].  Seen there regularly.  

## 2012-01-20 NOTE — Assessment & Plan Note (Signed)
Profound per wife.  Would stop metoprolol as this may be contributing.  He has OSA but has not been able to tolerate treatment.  This also may be contributing.  Depression could play a role as well.  If stopping metoprolol does not improve symptoms, would consider trying SSRI.  He also needs to try to be more active, I suggested daily walks.

## 2012-01-20 NOTE — Assessment & Plan Note (Signed)
Short-term memory difficult per his wife.  I will have him evaluated by neurology for formal neuropsych testing.  He may benefit from treatment with Aricept or Namenda.

## 2012-01-20 NOTE — Assessment & Plan Note (Signed)
Stable mild to moderate stenosis, repeat carotids in 7/14.

## 2012-01-27 ENCOUNTER — Telehealth: Payer: Self-pay | Admitting: Cardiology

## 2012-01-27 NOTE — Telephone Encounter (Signed)
I did not call pt. I spoke with pt and told him I did not call.

## 2012-01-27 NOTE — Telephone Encounter (Signed)
Pt rtn call, wife took message and doesn't know who or why , pls call

## 2012-02-05 ENCOUNTER — Ambulatory Visit (INDEPENDENT_AMBULATORY_CARE_PROVIDER_SITE_OTHER): Payer: Medicare Other | Admitting: Pharmacist

## 2012-02-05 DIAGNOSIS — I359 Nonrheumatic aortic valve disorder, unspecified: Secondary | ICD-10-CM | POA: Diagnosis not present

## 2012-02-05 DIAGNOSIS — Z7901 Long term (current) use of anticoagulants: Secondary | ICD-10-CM

## 2012-02-09 ENCOUNTER — Telehealth: Payer: Self-pay | Admitting: *Deleted

## 2012-02-09 NOTE — Telephone Encounter (Signed)
[name] - neuro << Less Detail     neuro       [name]       Sent: Tue February 03, 2012  9:54 AM    To: Jacqlyn Krauss, RN       [name]    MRN: [id number] DOB: 07/05/1934    Pt Home: [phone]               Message     02-18-12 @ 2pm with  Dr. Anne Hahn with [facility]  Pt is aware of the appointment.

## 2012-02-19 ENCOUNTER — Ambulatory Visit (INDEPENDENT_AMBULATORY_CARE_PROVIDER_SITE_OTHER): Payer: Medicare Other | Admitting: *Deleted

## 2012-02-19 DIAGNOSIS — R6889 Other general symptoms and signs: Secondary | ICD-10-CM | POA: Diagnosis not present

## 2012-02-19 DIAGNOSIS — E1142 Type 2 diabetes mellitus with diabetic polyneuropathy: Secondary | ICD-10-CM | POA: Diagnosis not present

## 2012-02-19 DIAGNOSIS — D518 Other vitamin B12 deficiency anemias: Secondary | ICD-10-CM | POA: Diagnosis not present

## 2012-02-19 DIAGNOSIS — R269 Unspecified abnormalities of gait and mobility: Secondary | ICD-10-CM | POA: Diagnosis not present

## 2012-02-19 DIAGNOSIS — I359 Nonrheumatic aortic valve disorder, unspecified: Secondary | ICD-10-CM | POA: Diagnosis not present

## 2012-02-19 DIAGNOSIS — I679 Cerebrovascular disease, unspecified: Secondary | ICD-10-CM | POA: Diagnosis not present

## 2012-02-19 DIAGNOSIS — Z7901 Long term (current) use of anticoagulants: Secondary | ICD-10-CM

## 2012-02-19 DIAGNOSIS — R413 Other amnesia: Secondary | ICD-10-CM | POA: Diagnosis not present

## 2012-02-20 ENCOUNTER — Ambulatory Visit: Payer: Medicare Other | Admitting: Cardiology

## 2012-02-26 ENCOUNTER — Ambulatory Visit: Payer: Medicare Other | Admitting: Cardiology

## 2012-03-15 ENCOUNTER — Other Ambulatory Visit: Payer: Medicare Other

## 2012-03-15 ENCOUNTER — Ambulatory Visit: Payer: Medicare Other | Admitting: Surgery

## 2012-03-18 ENCOUNTER — Ambulatory Visit (INDEPENDENT_AMBULATORY_CARE_PROVIDER_SITE_OTHER): Payer: Medicare Other | Admitting: *Deleted

## 2012-03-18 ENCOUNTER — Encounter: Payer: Self-pay | Admitting: Cardiology

## 2012-03-18 ENCOUNTER — Ambulatory Visit (INDEPENDENT_AMBULATORY_CARE_PROVIDER_SITE_OTHER): Payer: Medicare Other | Admitting: Cardiology

## 2012-03-18 ENCOUNTER — Encounter: Payer: Self-pay | Admitting: Neurosurgery

## 2012-03-18 VITALS — BP 128/64 | HR 80 | Ht 68.0 in | Wt 226.0 lb

## 2012-03-18 DIAGNOSIS — I359 Nonrheumatic aortic valve disorder, unspecified: Secondary | ICD-10-CM | POA: Diagnosis not present

## 2012-03-18 DIAGNOSIS — G609 Hereditary and idiopathic neuropathy, unspecified: Secondary | ICD-10-CM | POA: Diagnosis not present

## 2012-03-18 DIAGNOSIS — Z7901 Long term (current) use of anticoagulants: Secondary | ICD-10-CM | POA: Diagnosis not present

## 2012-03-18 DIAGNOSIS — I10 Essential (primary) hypertension: Secondary | ICD-10-CM | POA: Diagnosis not present

## 2012-03-18 DIAGNOSIS — R0602 Shortness of breath: Secondary | ICD-10-CM

## 2012-03-18 DIAGNOSIS — I5032 Chronic diastolic (congestive) heart failure: Secondary | ICD-10-CM | POA: Diagnosis not present

## 2012-03-18 DIAGNOSIS — Z951 Presence of aortocoronary bypass graft: Secondary | ICD-10-CM

## 2012-03-18 DIAGNOSIS — I6529 Occlusion and stenosis of unspecified carotid artery: Secondary | ICD-10-CM

## 2012-03-18 DIAGNOSIS — Z954 Presence of other heart-valve replacement: Secondary | ICD-10-CM | POA: Diagnosis not present

## 2012-03-18 DIAGNOSIS — R809 Proteinuria, unspecified: Secondary | ICD-10-CM | POA: Diagnosis not present

## 2012-03-18 DIAGNOSIS — I5030 Unspecified diastolic (congestive) heart failure: Secondary | ICD-10-CM

## 2012-03-18 DIAGNOSIS — I739 Peripheral vascular disease, unspecified: Secondary | ICD-10-CM

## 2012-03-18 DIAGNOSIS — Z23 Encounter for immunization: Secondary | ICD-10-CM | POA: Diagnosis not present

## 2012-03-18 LAB — BASIC METABOLIC PANEL
CO2: 30 mEq/L (ref 19–32)
Calcium: 9.5 mg/dL (ref 8.4–10.5)
Chloride: 91 mEq/L — ABNORMAL LOW (ref 96–112)
Potassium: 4.1 mEq/L (ref 3.5–5.1)
Sodium: 129 mEq/L — ABNORMAL LOW (ref 135–145)

## 2012-03-18 LAB — POCT INR: INR: 1.8

## 2012-03-18 LAB — BRAIN NATRIURETIC PEPTIDE: Pro B Natriuretic peptide (BNP): 152 pg/mL — ABNORMAL HIGH (ref 0.0–100.0)

## 2012-03-18 NOTE — Progress Notes (Signed)
Patient ID: [name], male   DOB: [birth year], 77 y.o.   MRN: [id number] PCP: Dr. Drue Novel  77 yo with history of AS s/p redo AVR, diastolic CHF, CAD, chronic atrial fibrillation, s/p pacemaker presents for cardiology followup.  Patient had redo AVR with #23 Edwards pericardial valve in 7/12.  Cath prior to surgery showed stable disease with patent grafts.  He follows with [facility] for [facility].   Lately, he has been doing well.  I increased his Lasix earlier in the summer and he seems to be doing better.  He says that he is not as fatigued.  He denies dyspnea when walking on flat ground.  No chest pain.  Weight is stable.    Labs (1/12): LDL 71, HDL 29, K 4.7, creatinine 1.1 Labs (4/12): K 4, creatinine 1.0, BNP 262 Labs (7/12): LDL 92, HDL 32 Labs (8/12): K 4.3, creatinine 1.15 => 1.1, BNP 174 Labs (9/12): K 4, creatinine 0.9, BNP 126 Labs (1/13): BNP 130, K 3.6, creatinine 1.5 Labs (3/13): K 4.8, creatinine 1.1 Labs (6/13): LDL 64 Labs (7/13): BNP 446 => 200, K 4.6, creatinine 1.1  PMH:  1. Aortic stenosis: s/p bioprosthetic aortic valve replacement in 06/2004.  Echo in 7/11 showed mean gradient 35 mmHg across the bioprosthetic valve.  Echo in 6/12 showed worsening of the bioprosthetic mean gradient to 46 mmHg.  TEE in 7/12 showed only 1 cusp of the bioprosthetic aortic valve moving (heavily calcified), mean 52 mmHg/peak gradient 76 mmHg. Patient had redo AVR (7/12) with #23 Edwards pericardial valve.  Echo (8/12) post AVR showed EF 55%, inferior/inferoseptal HK, bioprosthetic aortic valve with mean gradient 16/peak 28, mild MR, moderately dilated RV with mildly decreased RV systolic function, PASP 68 mmHg.  2. CAD: CABG with AVR in 1/06.  Patient had SVG-OM and SVG-D.  LHC (7/12) showed 50% distal RCA lesion extending into PLV and PDA, total occlusion CFX, 50% pLAD, 90% ostial D1, patent SVG-D1 and SVG-OM.  3. Diabetes 4. Hyperlipidemia 5. HTN 6. Sick sinus syndrome with Medtronic dual chamber PCM 7.  Obesity 8. OSA: Not using CPAP (cannot tolerate) 9. Chronic diastolic CHF: Echo in 7/11 showed EF 60-65% with moderate LV hypertrophy, moderate diastolic dysfunction, mild mitral stenosis, bioprosthetic aortic valve with mean gradient 35 mmHg (moderate stenosis) and PA systolic pressure 69 mmHg.   Echo (6/12) showed EF 60-65%, moderate LV hypertrophy, bioprosthetic aortic valve with mean gradient 46 mmHg (severe stenosis) and PA systolic pressure 74 mmHg, mild MR.  10. Nephrolithiasis 11. PAD: Has seen [facility]Alhany and Brabham.  Lower extremity angiography in 9/10 showed occlusion of all 3 vessels below the knees in both legs.  Patient then had L SFA stent and PTCA left AT by Dr. Myra Gianotti in 2011.  He had amputation of the left great toe in 8/11.  12. Atrial fibrillation: Chronic, on coumadin.  LAA thrombus noted on TEE in 7/12.  13. Carotid stenosis: carotid dopplers (7/11) with 40-59% bilateral ICA stenosis.  Carotids (6/12) with 40-59% bilateral ICA stenosis.  Carotids (7/13) with 40-59% bilateral ICA stenosis.  14. Beta blocker intolerance.   SH: Married, lives in [place].  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   FH: Father with MI at 80, mother with CVA at 69   Current Outpatient Prescriptions  Medication Sig Dispense Refill  . aspirin EC 81 MG tablet Take 81 mg by mouth daily.      Marland Kitchen  ezetimibe-simvastatin (VYTORIN) 10-40 MG per tablet Take 1 tablet by mouth at bedtime.       . furosemide (LASIX) 80 MG tablet Take 160mg  everyday PLUS an extra 80mg  every other day at 4PM  120 tablet  3  . insulin glargine (LANTUS) 100 UNIT/ML injection Inject 20-70 Units into the skin 2 (two) times daily. 20 units in the morning; 70 units at bedtime       . insulin glulisine (APIDRA) 100 UNIT/ML injection Inject 0-14 Units into the skin 3 (three) times daily before meals. Pt uses home sliding scale based on cbg      . lisinopril  (PRINIVIL,ZESTRIL) 5 MG tablet Take 1 tablet (5 mg total) by mouth daily.  30 tablet  6  . metFORMIN (GLUCOPHAGE-XR) 500 MG 24 hr tablet Take 500 mg by mouth 2 (two) times daily.      . potassium chloride SA (K-DUR,KLOR-CON) 20 MEQ tablet Take 1 tablet (20 mEq total) by mouth 2 (two) times daily.  60 tablet  3  . spironolactone (ALDACTONE) 25 MG tablet Take 1 tablet (25 mg total) by mouth daily.  90 tablet  3  . warfarin (COUMADIN) 5 MG tablet take 1 tablet by mouth once daily  120 tablet  1    BP 128/64  Pulse 80  Ht 5\' 8"  (1.727 m)  Wt 226 lb (102.513 kg)  BMI 34.36 kg/m2 General: obese, NAD Neck: JVP 7 cm, no thyromegaly or thyroid nodule.  Lungs: Slight crackles at bases bilaterally. CV: Nondisplaced PMI.  Heart irregular S1/S2, no S3/S4, 2/6 early SEM.  1+ edema 1/4 to knees bilaterally.  Unable to palpate pedal pulses.  Bilateral soft carotid bruits. Abdomen: Soft, nontender, no hepatosplenomegaly, no distention.  Neurologic: Alert and oriented x 3.  Psych: Normal affect. Extremities: No clubbing or cyanosis.   Assessment/Plan: AORTIC VALVE REPLACEMENT Last echo showed normal function of bioprosthetic aortic valve.  Carotid stenosis Stable mild to moderate stenosis, repeat carotids in 7/14.  CLAUDICATION, INTERMITTENT  Followed at [facility]. Seen there regularly.  CORONARY ARTERY DISEASE  Stable with patent grafts on 7/12 cath. Continue ASA 81, Vytorin, ACEI. HYPERLIPIDEMIA LDL at goal (< 70) in 6/13.  Permanent atrial fibrillation  Chronic. LAA thrombus noted on TEE prior to AVR. Continue coumadin. Should be bridged with heparin or Lovenox if he needs to come off coumadin in the future.  Diastolic heart failure NYHA class II-III symptoms, somewhat improved.  He is not markedly volume overloaded.  Continue current Lasix dosing.  Check BMET/BNP today.   Matilyn Fehrman [employer]

## 2012-03-18 NOTE — Patient Instructions (Addendum)
Your physician recommends that you have lab work today--BMET/BNP.  Your physician recommends that you schedule a follow-up appointment in: 3 months with Dr McLean.     

## 2012-03-19 ENCOUNTER — Ambulatory Visit (INDEPENDENT_AMBULATORY_CARE_PROVIDER_SITE_OTHER): Payer: Medicare Other | Admitting: Neurosurgery

## 2012-03-19 ENCOUNTER — Encounter (INDEPENDENT_AMBULATORY_CARE_PROVIDER_SITE_OTHER): Payer: Medicare Other | Admitting: *Deleted

## 2012-03-19 ENCOUNTER — Other Ambulatory Visit: Payer: Self-pay | Admitting: Cardiology

## 2012-03-19 ENCOUNTER — Encounter: Payer: Self-pay | Admitting: Neurosurgery

## 2012-03-19 VITALS — BP 132/72 | HR 65 | Resp 16 | Ht 68.0 in | Wt 226.4 lb

## 2012-03-19 DIAGNOSIS — I739 Peripheral vascular disease, unspecified: Secondary | ICD-10-CM

## 2012-03-19 DIAGNOSIS — Z48812 Encounter for surgical aftercare following surgery on the circulatory system: Secondary | ICD-10-CM

## 2012-03-19 NOTE — Addendum Note (Signed)
Addended by: Sharee Pimple on: 03/19/2012 01:33 PM   Modules accepted: Orders

## 2012-03-19 NOTE — Progress Notes (Signed)
[facility]  PAD/PVD Office Note  CC: Six-month PVD surveillance Referring Physician: Brabham  History of Present Illness: 77 year old male patient of Dr. Myra Gianotti who is status postsub-intimal recanalization of his left anterior tibial artery with balloon angioplasty as well as stenting of his left superficial femoral artery using a Abbott absolute pro 7 x 80 stent. The anterior tibial was angioplasty with a 3 mm balloon. This was done on 02/10/2010. The patient denies claudication, rest pain, open ulcerations on the lower extremities. The patient states he can walk with "no problem", he has no other complaints.    Past Medical History  Diagnosis Date  . Erectile dysfunction   . Obesity   . OSA (obstructive sleep apnea)     not using CPAP  . History of nephrolithiasis   . HTN (hypertension)   . Aortal stenosis 2006    biophrostetic  . Atrial fibrillation     onset after CABG--- [facility]  . S/P aortic valve replacement   . CHF (congestive heart failure)     abter CABG  . Pacemaker 2/07    VVI  . PVD (peripheral vascular disease)     CT angio showed- vascular insuff, intestine and RAS bilaterally, andgiogram 02/2009 severe PVD medical management  . Nephrolithiasis   . Skin cancer     surgery (nose) 09-2010  . Sick sinus syndrome     [name]  . Hyperlipidemia   . Arthritis   . Heart murmur   . Ulcer   . Gangrene   . Shortness of breath   . Coronary artery disease   . DM type 2 (diabetes mellitus, type 2)     used to see Dr Roanna Raider, now f/u by Dr Drue Novel    ROS: [x]  Positive   [ ]  Denies    General: [ ]  Weight loss, [ ]  Fever, [ ]  chills Neurologic: [ ]  Dizziness, [ ]  Blackouts, [ ]  Seizure [ ]  Stroke, [ ]  "Mini stroke", [ ]  Slurred speech, [ ]  Temporary blindness; [ ]  weakness in arms or legs, [ ]  Hoarseness Cardiac: [ ]  Chest pain/pressure, [ ]  Shortness of breath at rest [ ]  Shortness of breath with  exertion, [ ]  Atrial fibrillation or irregular heartbeat Vascular: [ ]  Pain in legs with walking, [ ]  Pain in legs at rest, [ ]  Pain in legs at night,  [ ]  Non-healing ulcer, [ ]  Blood clot in vein/DVT,   Pulmonary: [ ]  Home oxygen, [ ]  Productive cough, [ ]  Coughing up blood, [ ]  Asthma,  [ ]  Wheezing Musculoskeletal:  [ ]  Arthritis, [ ]  Low back pain, [ ]  Joint pain Hematologic: [ ]  Easy Bruising, [ ]  Anemia; [ ]  Hepatitis Gastrointestinal: [ ]  Blood in stool, [ ]  Gastroesophageal Reflux/heartburn, [ ]  Trouble swallowing Urinary: [ ]  chronic Kidney disease, [ ]  on HD - [ ]  MWF or [ ]  TTHS, [ ]  Burning with urination, [ ]  Difficulty urinating Skin: [ ]  Rashes, [ ]  Wounds Psychological: [ ]  Anxiety, [ ]  Depression   Social History History  Substance Use Topics  . Smoking status: Former Smoker    Types: Cigarettes    Quit date: 06/23/1988  . Smokeless tobacco: Former Neurosurgeon  . Alcohol Use: No    Family History Family History  Problem Relation Age of Onset  . Heart attack Father 32  . Stroke Mother 49  . Heart disease Sister 22     also stomach  aneurysm  . Cancer Neg Hx     colon, prostate  . Diabetes Sister     and/or heart problems  . Diabetes Sister     and/or heart problems  . Diabetes Sister     and/or heart problems    Allergies  Allergen Reactions  . Metoprolol Succinate   . Potassium-Containing Compounds     IV--loss of conciousness    Current Outpatient Prescriptions  Medication Sig Dispense Refill  . aspirin EC 81 MG tablet Take 81 mg by mouth daily.      Marland Kitchen ezetimibe-simvastatin (VYTORIN) 10-40 MG per tablet Take 1 tablet by mouth at bedtime.       . furosemide (LASIX) 80 MG tablet Take 160mg  everyday PLUS an extra 80mg  every other day at 4PM  120 tablet  3  . insulin glargine (LANTUS) 100 UNIT/ML injection Inject 20-70 Units into the skin 2 (two) times daily. 20 units in the morning; 70 units at bedtime       . insulin glulisine (APIDRA) 100 UNIT/ML  injection Inject 0-14 Units into the skin 3 (three) times daily before meals. Pt uses home sliding scale based on cbg      . lisinopril (PRINIVIL,ZESTRIL) 5 MG tablet Take 1 tablet (5 mg total) by mouth daily.  30 tablet  6  . metFORMIN (GLUCOPHAGE-XR) 500 MG 24 hr tablet Take 500 mg by mouth 2 (two) times daily.      . potassium chloride SA (K-DUR,KLOR-CON) 20 MEQ tablet Take 1 tablet (20 mEq total) by mouth 2 (two) times daily.  60 tablet  3  . spironolactone (ALDACTONE) 25 MG tablet Take 1 tablet (25 mg total) by mouth daily.  90 tablet  3  . warfarin (COUMADIN) 5 MG tablet take 1 tablet by mouth once daily  120 tablet  1    Physical Examination  Filed Vitals:   03/19/12 1138  BP: 132/72  Pulse: 65  Resp: 16    Body mass index is 34.42 kg/(m^2).  General:  WDWN in NAD Gait: Normal HEENT: WNL Eyes: Pupils equal Pulmonary: normal non-labored breathing , without Rales, rhonchi,  wheezing Cardiac: RRR, without  Murmurs, rubs or gallops; No carotid bruits Abdomen: soft, NT, no masses Skin: no rashes, ulcers noted Vascular Exam/Pulses: Palpable femoral pulses bilaterally lower extremities appear to be  well-perfused  Extremities without ischemic changes, no Gangrene , no cellulitis; no open wounds;  Musculoskeletal: no muscle wasting or atrophy  Neurologic: A&O X 3; Appropriate Affect ; SENSATION: normal; MOTOR FUNCTION:  moving all extremities equally. Speech is fluent/normal  Non-Invasive Vascular Imaging: ABIs are not obtained due to noncompressible vessels, TBI on the right is 0.48 not obtained on the left due to amputation. Patent left femoral artery stent  ASSESSMENT/PLAN: Asymptomatic patient with no claudication, the patient will followup in 6 months with repeat duplex and lower extremity arterial evaluation. The patient's questions were encouraged and answered, he is in agreement with this plan.  Lauree Chandler ANP   Clinic M.D.: Imogene Burn

## 2012-04-01 ENCOUNTER — Ambulatory Visit (INDEPENDENT_AMBULATORY_CARE_PROVIDER_SITE_OTHER): Payer: Medicare Other | Admitting: *Deleted

## 2012-04-01 DIAGNOSIS — I359 Nonrheumatic aortic valve disorder, unspecified: Secondary | ICD-10-CM | POA: Diagnosis not present

## 2012-04-01 DIAGNOSIS — Z7901 Long term (current) use of anticoagulants: Secondary | ICD-10-CM

## 2012-04-19 ENCOUNTER — Telehealth: Payer: Self-pay | Admitting: Cardiology

## 2012-04-19 NOTE — Telephone Encounter (Signed)
I have form for Dr Shirlee Latch to complete and sign on Thursday. Pt aware.

## 2012-04-19 NOTE — Telephone Encounter (Signed)
Pt was looking for the form he left to be filled out regarding his vytorin

## 2012-04-26 DIAGNOSIS — I495 Sick sinus syndrome: Secondary | ICD-10-CM | POA: Diagnosis not present

## 2012-04-29 ENCOUNTER — Ambulatory Visit (INDEPENDENT_AMBULATORY_CARE_PROVIDER_SITE_OTHER): Payer: Medicare Other | Admitting: *Deleted

## 2012-04-29 DIAGNOSIS — I359 Nonrheumatic aortic valve disorder, unspecified: Secondary | ICD-10-CM | POA: Diagnosis not present

## 2012-04-29 DIAGNOSIS — Z7901 Long term (current) use of anticoagulants: Secondary | ICD-10-CM | POA: Diagnosis not present

## 2012-05-06 ENCOUNTER — Other Ambulatory Visit: Payer: Self-pay | Admitting: Cardiology

## 2012-05-06 MED ORDER — EZETIMIBE-SIMVASTATIN 10-40 MG PO TABS: 1.0000 | ORAL_TABLET | Freq: Every day | ORAL | Status: AC

## 2012-05-06 NOTE — Telephone Encounter (Signed)
New problem:   Form was drop of last week to the office.

## 2012-06-09 ENCOUNTER — Ambulatory Visit (INDEPENDENT_AMBULATORY_CARE_PROVIDER_SITE_OTHER): Payer: Medicare Other | Admitting: *Deleted

## 2012-06-09 DIAGNOSIS — Z7901 Long term (current) use of anticoagulants: Secondary | ICD-10-CM | POA: Diagnosis not present

## 2012-06-09 DIAGNOSIS — I359 Nonrheumatic aortic valve disorder, unspecified: Secondary | ICD-10-CM | POA: Diagnosis not present

## 2012-07-21 ENCOUNTER — Other Ambulatory Visit: Payer: Self-pay | Admitting: Internal Medicine

## 2012-07-21 DIAGNOSIS — I5033 Acute on chronic diastolic (congestive) heart failure: Secondary | ICD-10-CM

## 2012-07-21 MED ORDER — POTASSIUM CHLORIDE CRYS ER 20 MEQ PO TBCR: 20.0000 meq | EXTENDED_RELEASE_TABLET | Freq: Two times a day (BID) | ORAL | Status: DC

## 2012-07-21 NOTE — Telephone Encounter (Signed)
Discussed with pt. Scheduled appt & refill done.

## 2012-07-21 NOTE — Telephone Encounter (Signed)
Advise patient: Will refill 2 months, needs a routine office visit before next refills

## 2012-07-21 NOTE — Telephone Encounter (Signed)
refill K-DUR,KLOR-CON 20 MEQ Take 1 tablet (20 mEq total) by mouth 2 (two) times daily  #60 wt/3-refills last fill 12.10.13

## 2012-07-21 NOTE — Telephone Encounter (Signed)
Pt has not been seen within a year. OK to refill? 

## 2012-07-26 ENCOUNTER — Encounter: Payer: Self-pay | Admitting: Internal Medicine

## 2012-07-26 ENCOUNTER — Ambulatory Visit (INDEPENDENT_AMBULATORY_CARE_PROVIDER_SITE_OTHER): Payer: Medicare Other | Admitting: Internal Medicine

## 2012-07-26 VITALS — BP 140/71 | HR 81 | Ht 69.0 in | Wt 228.4 lb

## 2012-07-26 DIAGNOSIS — I442 Atrioventricular block, complete: Secondary | ICD-10-CM | POA: Diagnosis not present

## 2012-07-26 DIAGNOSIS — I495 Sick sinus syndrome: Secondary | ICD-10-CM | POA: Diagnosis not present

## 2012-07-26 DIAGNOSIS — I4891 Unspecified atrial fibrillation: Secondary | ICD-10-CM

## 2012-07-26 DIAGNOSIS — I4821 Permanent atrial fibrillation: Secondary | ICD-10-CM

## 2012-07-26 LAB — PACEMAKER DEVICE OBSERVATION
BATTERY VOLTAGE: 2.77 V
BMOD-0001RV: LOW
BMOD-0004RV: 5
RV LEAD IMPEDENCE PM: 675 Ohm

## 2012-07-26 NOTE — Progress Notes (Signed)
PCP: Willow Ora, MD Primary Cardiologist:  Dr Forestine Na PHU RECORD is a 78 y.o. male who presents today for electrophysiology followup.  He had a pacemaker implanted by Dr Juanda Chance. He has done well since. Today, he denies symptoms of palpitations, chest pain, shortness of breath,  lower extremity edema, dizziness, presyncope, or syncope.  The patient is otherwise without complaint today.   Past Medical History  Diagnosis Date  . Erectile dysfunction   . Obesity   . OSA (obstructive sleep apnea)     not using CPAP  . History of nephrolithiasis   . HTN (hypertension)   . Aortal stenosis 2006    biophrostetic  . Atrial fibrillation     onset after CABG--- [facility]  . S/P aortic valve replacement   . CHF (congestive heart failure)     abter CABG  . Pacemaker 2/07    VVI  . PVD (peripheral vascular disease)     CT angio showed- vascular insuff, intestine and RAS bilaterally, andgiogram 02/2009 severe PVD medical management  . Nephrolithiasis   . Skin cancer     surgery (nose) 09-2010  . Sick sinus syndrome     [name]  . Hyperlipidemia   . Arthritis   . Heart murmur   . Ulcer   . Gangrene   . Shortness of breath   . Coronary artery disease   . DM type 2 (diabetes mellitus, type 2)     used to see Dr Roanna Raider, now f/u by Dr Drue Novel   Past Surgical History  Procedure Date  . Aortic valve replacement     biophrostetic for Ao Stenosis 2006  . Coronary artery bypass graft 2006  . Tonsillectomy   . Cataract extraction, bilateral   . Amputation 01/2010    great toe  . Pacemaker placement 07/2005    Medtronic Sigma SR implanted by Dr Juanda Chance  . Aortic valve replacement 01/17/2011    S/P redo median sternotomy, extracorporeal circulation, redo Aortic Valve Replacement using a 23-mm Edwards pericardial Magna-Ease valve, Dr Concepcion Living  . Angioplasty / stenting femoral 02/10/10    Left femoral    Current Outpatient Prescriptions  Medication  Sig Dispense Refill  . aspirin EC 81 MG tablet Take 81 mg by mouth daily.      Marland Kitchen ezetimibe-simvastatin (VYTORIN) 10-40 MG per tablet Take 1 tablet by mouth at bedtime.  30 tablet  3  . furosemide (LASIX) 80 MG tablet Take 160mg  everyday PLUS an extra 80mg  every other day at 4PM  120 tablet  3  . insulin glargine (LANTUS) 100 UNIT/ML injection Inject 20-70 Units into the skin 2 (two) times daily. 20 units in the morning; 70 units at bedtime       . insulin glulisine (APIDRA) 100 UNIT/ML injection Inject 0-14 Units into the skin 3 (three) times daily before meals. Pt uses home sliding scale based on cbg      . lisinopril (PRINIVIL,ZESTRIL) 5 MG tablet take 1 tablet by mouth once daily for high blood pressure  30 tablet  9  . metFORMIN (GLUCOPHAGE-XR) 500 MG 24 hr tablet Take 500 mg by mouth 2 (two) times daily.      . potassium chloride SA (K-DUR,KLOR-CON) 20 MEQ tablet Take 1 tablet (20 mEq total) by mouth 2 (two) times daily.  60 tablet  1  . spironolactone (ALDACTONE) 25 MG tablet Take 1 tablet (25 mg total) by mouth daily.  90 tablet  3  .  warfarin (COUMADIN) 5 MG tablet take 1 tablet by mouth once daily  120 tablet  1    Physical Exam: Filed Vitals:   07/26/12 1016  BP: 140/71  Pulse: 81  Height: 5\' 9"  (1.753 m)  Weight: 228 lb 6.4 oz (103.602 kg)    GEN- The patient is well appearing, alert and oriented x 3 today.   Head- normocephalic, atraumatic Eyes-  Sclera clear, conjunctiva pink Ears- hearing intact Oropharynx- clear Lungs- Clear to ausculation bilaterally, normal work of breathing Chest- pacemaker pocket is well healed Heart- Regular rate and rhythm, no murmurs, rubs or gallops, PMI not laterally displaced GI- soft, NT, ND, + BS Extremities- no clubbing, cyanosis, or edema  Pacemaker interrogation- reviewed in detail today,  See PACEART report  Assessment and Plan:  1. Complete heart block Normal pacemaker function See Pace Art report No changes today  2.  Permanent afib Continue coumadin long term

## 2012-07-26 NOTE — Patient Instructions (Addendum)
Your physician wants you to follow-up in: 6 months in the device clinic and 12 months with Dr Allred You will receive a reminder letter in the mail two months in advance. If you don't receive a letter, please call our office to schedule the follow-up appointment.  

## 2012-08-04 ENCOUNTER — Ambulatory Visit (INDEPENDENT_AMBULATORY_CARE_PROVIDER_SITE_OTHER): Payer: Medicare Other | Admitting: Internal Medicine

## 2012-08-04 ENCOUNTER — Encounter: Payer: Self-pay | Admitting: Internal Medicine

## 2012-08-04 VITALS — BP 134/70 | HR 66 | Temp 97.7°F | Ht 68.0 in | Wt 234.0 lb

## 2012-08-04 DIAGNOSIS — E041 Nontoxic single thyroid nodule: Secondary | ICD-10-CM

## 2012-08-04 DIAGNOSIS — R413 Other amnesia: Secondary | ICD-10-CM | POA: Diagnosis not present

## 2012-08-04 DIAGNOSIS — I5030 Unspecified diastolic (congestive) heart failure: Secondary | ICD-10-CM | POA: Diagnosis not present

## 2012-08-04 DIAGNOSIS — G4733 Obstructive sleep apnea (adult) (pediatric): Secondary | ICD-10-CM | POA: Diagnosis not present

## 2012-08-04 DIAGNOSIS — Z0000 Encounter for general adult medical examination without abnormal findings: Secondary | ICD-10-CM

## 2012-08-04 DIAGNOSIS — E119 Type 2 diabetes mellitus without complications: Secondary | ICD-10-CM | POA: Diagnosis not present

## 2012-08-04 DIAGNOSIS — I5033 Acute on chronic diastolic (congestive) heart failure: Secondary | ICD-10-CM

## 2012-08-04 DIAGNOSIS — Z7901 Long term (current) use of anticoagulants: Secondary | ICD-10-CM

## 2012-08-04 DIAGNOSIS — R0902 Hypoxemia: Secondary | ICD-10-CM

## 2012-08-04 LAB — COMPREHENSIVE METABOLIC PANEL
Alkaline Phosphatase: 98 U/L (ref 39–117)
Creatinine, Ser: 1.1 mg/dL (ref 0.4–1.5)
Glucose, Bld: 197 mg/dL — ABNORMAL HIGH (ref 70–99)
Sodium: 136 mEq/L (ref 135–145)
Total Bilirubin: 1 mg/dL (ref 0.3–1.2)
Total Protein: 7.6 g/dL (ref 6.0–8.3)

## 2012-08-04 LAB — CBC WITH DIFFERENTIAL/PLATELET
Basophils Relative: 0.6 % (ref 0.0–3.0)
Eosinophils Relative: 2.6 % (ref 0.0–5.0)
HCT: 41.1 % (ref 39.0–52.0)
Hemoglobin: 13.7 g/dL (ref 13.0–17.0)
Lymphs Abs: 1.5 10*3/uL (ref 0.7–4.0)
MCV: 90.7 fl (ref 78.0–100.0)
Monocytes Absolute: 0.8 10*3/uL (ref 0.1–1.0)
Monocytes Relative: 8.3 % (ref 3.0–12.0)
Neutro Abs: 6.6 10*3/uL (ref 1.4–7.7)
Platelets: 177 10*3/uL (ref 150.0–400.0)
WBC: 9.2 10*3/uL (ref 4.5–10.5)

## 2012-08-04 LAB — HEMOGLOBIN A1C: Hgb A1c MFr Bld: 12.4 % — ABNORMAL HIGH (ref 4.6–6.5)

## 2012-08-04 MED ORDER — POTASSIUM CHLORIDE CRYS ER 20 MEQ PO TBCR: 20.0000 meq | EXTENDED_RELEASE_TABLET | Freq: Two times a day (BID) | ORAL | Status: DC

## 2012-08-04 NOTE — Assessment & Plan Note (Addendum)
Overdue for a Coumadin check, INR today 1.4. Current dose coumadin 5 mg 1 tab a day (less than was Rx) We'll notify cardiology

## 2012-08-04 NOTE — Progress Notes (Signed)
Subjective:    Patient ID: [name], male    DOB: 1935/02/28, 78 y.o.   MRN: [id number]  HPI Here for Medicare AWV; he has multiple medical problems, last office visit with me more than a year ago: 1. Risk factors based on Past M, S, F history: reviewed 2. Physical Activities: active at home, occasional walk at the mall 3. Depression/mood: (-) screening  4. Hearing: No problemss noted or reported   5. ADL's: independent    6. Fall Risk: no falls, see instructions, counseled  7. home Safety: does feel safe at home  8. Height, weight, &visual acuity: see VS, vision corrected w/ glasses 9. Counseling: provided 10. Labs ordered based on risk factors: if needed  11. Referral Coordination: if needed 12.  Care Plan, see assessment and plan  13.   Cognitive Assessment: Today he is alert and oriented x3,  mobility moderately limited due to to comorbidities  In addition, today we discussed the following: Diabetes, the patient takes Lantus as prescribed, uses Apidra infrequently. Still takes metformin. Reports that he checks blood sugars very seldom, "they are less than 300". He supposedly following with endocrinology, does not recorded last time he was seen or his blood was checked. High cholesterol, good medication compliance Cardiovascular:  Good compliance with all medications. He has CHF, self weight about once a week, weight is stable according to the patient.   Past Medical History  Diagnosis Date  . Erectile dysfunction   . Obesity   . OSA (obstructive sleep apnea)     not using CPAP  . History of nephrolithiasis   . HTN (hypertension)   . Aortal stenosis 2006    biophrostetic  . Atrial fibrillation     onset after CABG--- [facility]mad[facility]  . S/P aortic valve replacement   . CHF (congestive heart failure)     abter CABG  . Pacemaker 2/07    VVI  . PVD (peripheral vascular disease)     CT angio showed- vascular insuff, intestine and RAS bilaterally,  andgiogram 02/2009 severe PVD medical management  . Skin cancer     surgery (nose) 09-2010  . Sick sinus syndrome     [name]  . Hyperlipidemia   . Arthritis   . Gangrene   . Coronary artery disease   . DM type 2 (diabetes mellitus, type 2)     used to see Dr Roanna Raider, now f/u by Dr Drue Novel   Past Surgical History  Procedure Laterality Date  . Aortic valve replacement      biophrostetic for Ao Stenosis 2006  . Coronary artery bypass graft  2006  . Tonsillectomy    . Cataract extraction, bilateral    . Amputation  01/2010    great toe  . Pacemaker placement  07/2005    Medtronic Sigma SR implanted by Dr Juanda Chance  . Aortic valve replacement  01/17/2011    S/P redo median sternotomy, extracorporeal circulation, redo Aortic Valve Replacement using a 23-mm Edwards pericardial Magna-Ease valve, Dr Concepcion Living  . Angioplasty / stenting femoral  02/10/10    Left femoral   History   Social History  . Marital Status: Married    Spouse Name: N/A    Number of Children: 2  . Years of Education: N/A   Occupational History  . 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 Smoking status: Former Smoker    Types: Cigarettes  Quit date: 06/23/1988  . Smokeless tobacco: Former Neurosurgeon  . Alcohol Use: No  . Drug Use: No  . Sexually Active: Not Currently   Other Topics Concern  . Not on file   Social History Narrative   Diet: healthy most of the time--   2 step children               Family History  Problem Relation Age of Onset  . Heart attack Father 85  . Stroke Mother 71  . Heart disease Sister 66     also stomach aneurysm  . Cancer Neg Hx     colon, prostate  . Diabetes Sister     2 sister w/ DM  . Colon cancer Neg Hx   . Prostate cancer Neg Hx     Review of Systems He is noted to have hypoxia, he is a former smoker, denies cough. He actually does not feel short of breath. Denies chest pain No nausea, vomiting, diarrhea or blood in  the stools. No dysuria or gross hematuria.     Objective:   Physical Exam General -- alert, well-developed    Neck --no thyromegaly  Lungs --  no respiratory distress, breath sounds decreased, few dry crackles at bases. Heart-- irreg , soft systolic murmur   Abdomen--soft, non-tender, no distention, no masses  Extremities--  +/+++ pretibial edema bilaterally  Neurologic-- alert & oriented X3 and strength normal in all extremities. Psych-- Cognition and judgment appear intact. Alert and cooperative with normal attention span and concentration.  not anxious appearing and not depressed appearing.       Assessment & Plan:

## 2012-08-04 NOTE — Assessment & Plan Note (Signed)
F/u by cardiology, on multiple medications, will check a CMP, rf potassium per patient request

## 2012-08-04 NOTE — Assessment & Plan Note (Signed)
abnormal u/s 01-2010, borderline for BX Had 2 additional ultrasounds, 07-2010 and 05-2011 ---> stable thyroid nodule

## 2012-08-04 NOTE — Assessment & Plan Note (Signed)
History of a sleep apnea, today he reports he is intolerant to CPAP

## 2012-08-04 NOTE — Assessment & Plan Note (Addendum)
F/u by Dr. Lisabeth Devoid, patient is not sure when wasthe last time he was seen, last A1c?. CBG today 183. Plan:  Arrange a endocrinology visit Check a hemoglobin A1c

## 2012-08-04 NOTE — Assessment & Plan Note (Addendum)
Patient noted to be hypoxemic, he is asymptomatic. O2 sat at rest 88%. O2 sat after ambulation 85%. Last chest x-ray 07-2011  He is a former smoker, has CHF, sleep apnea intolerant to CPAP. Hypoxemia likely multifactorial Plan: PFTs, 24-hour pulse oximetry.

## 2012-08-04 NOTE — Patient Instructions (Signed)
Next visit in 3-4 months 

## 2012-08-04 NOTE — Assessment & Plan Note (Addendum)
Td 2002 Reports had a shingles shot, no documentation Reports a flu shot few months ago Pneumonia shot 2009 -- Cscope 12-2000 --polyps Cscope November 30 2003---small polyps next colonoscopy 2015 per GI letter  -- All previous  PSAs normal, given co morbidities no further screening

## 2012-08-04 NOTE — Assessment & Plan Note (Signed)
Last visit when neurology 01-2012, MMSE was 27/30. They recommended to monitor   every 6-8 months. Recommend patient to come back in 3 months

## 2012-08-05 ENCOUNTER — Encounter: Payer: Self-pay | Admitting: Internal Medicine

## 2012-08-19 ENCOUNTER — Other Ambulatory Visit: Payer: Self-pay | Admitting: Cardiology

## 2012-09-20 ENCOUNTER — Ambulatory Visit: Payer: Medicare Other | Admitting: Neurosurgery

## 2012-09-24 ENCOUNTER — Ambulatory Visit (HOSPITAL_BASED_OUTPATIENT_CLINIC_OR_DEPARTMENT_OTHER)
Admission: RE | Admit: 2012-09-24 | Discharge: 2012-09-24 | Disposition: A | Discharge status: Home / Self Care | Payer: Medicare Other | Source: Ambulatory Visit | Attending: Internal Medicine | Admitting: Internal Medicine

## 2012-09-24 ENCOUNTER — Ambulatory Visit (INDEPENDENT_AMBULATORY_CARE_PROVIDER_SITE_OTHER): Payer: Medicare Other | Admitting: Internal Medicine

## 2012-09-24 ENCOUNTER — Emergency Department (HOSPITAL_BASED_OUTPATIENT_CLINIC_OR_DEPARTMENT_OTHER): Admission: EM | Admit: 2012-09-24 | Payer: Medicare Other | Source: Home / Self Care

## 2012-09-24 ENCOUNTER — Encounter (HOSPITAL_BASED_OUTPATIENT_CLINIC_OR_DEPARTMENT_OTHER): Payer: Self-pay | Admitting: Emergency Medicine

## 2012-09-24 VITALS — BP 126/72 | HR 71 | Temp 97.6°F | Wt 221.0 lb

## 2012-09-24 DIAGNOSIS — L97509 Non-pressure chronic ulcer of other part of unspecified foot with unspecified severity: Secondary | ICD-10-CM | POA: Diagnosis not present

## 2012-09-24 DIAGNOSIS — E119 Type 2 diabetes mellitus without complications: Secondary | ICD-10-CM | POA: Diagnosis not present

## 2012-09-24 DIAGNOSIS — Z7901 Long term (current) use of anticoagulants: Secondary | ICD-10-CM

## 2012-09-24 DIAGNOSIS — L97511 Non-pressure chronic ulcer of other part of right foot limited to breakdown of skin: Secondary | ICD-10-CM

## 2012-09-24 MED ORDER — CEPHALEXIN 500 MG PO CAPS: 500.0000 mg | ORAL_CAPSULE | Freq: Four times a day (QID) | ORAL | Status: DC

## 2012-09-24 NOTE — ED Notes (Signed)
C/o painful diabetic foot ulcer x one week.  Small amount of drainage, no fever.

## 2012-09-24 NOTE — Assessment & Plan Note (Addendum)
Currently taking Coumadin 5 mg one tablet daily (35 mg/week). INR today 1.2. Plan: 1.5 tablets a day except  Monday-Wednesday-Friday take only one tablet (45 mg/week) Followup with the Coumadin clinic in 5 days, will arrange.(message sent)

## 2012-09-24 NOTE — ED Provider Notes (Signed)
Pt sent from PCP office for foot xray, inadvertently registered as an ED patient. Does not need to be evaluated by ED, seen in PCP office about ago, outpatient treatment plan per Dr. Drue Novel.   Charles B. Bernette Mayers, MD 09/24/12 [phone]

## 2012-09-24 NOTE — Assessment & Plan Note (Addendum)
Last A1c was 12, he was recommended to see endocrinology, he missed his appointment. He's not taken but sugars frequently, wife is not sure if he is taking his medications as recommended. In no uncertain terms I told the patient that he will have problems from diabetes including kidney failure, heart attack, stroke, further lower extremity amputations. Currently takes Lantus 80 units and epidra sliding scale "depending on my sugar" but couldn't tell me exactly how he decides how much insulin he takes. Plan:  80 units of Lantus every night. epidra 5 units before each meal Refer to our endocrinologist ASAP

## 2012-09-24 NOTE — Assessment & Plan Note (Addendum)
78 year old gentleman with uncontrolled diabetes, and peripheral vascular disease, on Coumadin presents with a foot ulcer. There is nothing to be culture from the ulcer. He has an appointment to see the wound care center next week. I am somehow concerned about the swelling of the calf but  the calf is not  sore and not tender, doubt a DVT Until he sees the [facility] the plan is: keflex Avoid putting pressure in the area X-ray Improved diabetes care, see comments under diabetes Arrange a visit to see vascular surgery, he is due for a six-month checkup. Keep appointment with the wound care center

## 2012-09-24 NOTE — Patient Instructions (Signed)
Get the XR at [facility] [place] [facility], corner of [address] (10 minutes form here); they are open 24/7 [address]  [place], [place] [address] [phone] ---- Keep the appointment with the wound care center next week. You need to get your Coumadin check next week. NEW  dose of Coumadin: 1.5 tablets a day except  Monday-Wednesday-Friday take only one tablet ---- If the foot gets more red or swollen, you have fever or chills: Go to the ER. Avoid putting pressure on the site of the ulcer.

## 2012-09-24 NOTE — Progress Notes (Signed)
  Subjective:    Patient ID: [name], male    DOB: 03-16-1935, 78 y.o.   MRN: [id number]  HPI Acute visit, here with his wife Noted a wound at the right heel about 2 weeks ago, in the last few days has seen a small amount of bloody purulent discharge. We went over his medications list, poor compliance with insulin, see assessment and plan. Not taking CBGs regularly. Has refuse sometimes the help of his wife. Taking coumadin, no recent INR  Past Medical History  Diagnosis Date  . Erectile dysfunction   . Obesity   . OSA (obstructive sleep apnea)     not using CPAP  . History of nephrolithiasis   . HTN (hypertension)   . Aortal stenosis 2006    biophrostetic  . Atrial fibrillation     onset after CABG--- [facility]  . S/P aortic valve replacement   . CHF (congestive heart failure)     abter CABG  . Pacemaker 2/07    VVI  . PVD (peripheral vascular disease)     CT angio showed- vascular insuff, intestine and RAS bilaterally, andgiogram 02/2009 severe PVD medical management  . Skin cancer     surgery (nose) 09-2010  . Sick sinus syndrome     [name]  . Hyperlipidemia   . Arthritis   . Gangrene   . Coronary artery disease   . DM type 2 (diabetes mellitus, type 2)     used to see Dr Roanna Raider, now f/u by Dr Drue Novel   Past Surgical History  Procedure Laterality Date  . Aortic valve replacement      biophrostetic for Ao Stenosis 2006  . Coronary artery bypass graft  2006  . Tonsillectomy    . Cataract extraction, bilateral    . Amputation  01/2010    great toe  . Pacemaker placement  07/2005    Medtronic Sigma SR implanted by Dr Juanda Chance  . Aortic valve replacement  01/17/2011    S/P redo median sternotomy, extracorporeal circulation, redo Aortic Valve Replacement using a 23-mm Edwards pericardial Magna-Ease valve, Dr Concepcion Living  . Angioplasty / stenting femoral  02/10/10    Left femoral    Review of Systems Denies fever  chills Shortness or breath at baseline, no chest pain. No claudication.     Objective:   Physical Exam  Musculoskeletal:       Feet:   BP 126/72  Pulse 71  Temp(Src) 97.6 F (36.4 C) (Oral)  Wt 221 lb (100.245 kg)  BMI 33.61 kg/m2  SpO2 94%  General -- alert, well-developed, no apparent distress .    Lungs -- normal respiratory effort, no intercostal retractions, no accessory muscle use, and normal breath sounds (slightly decreased at bases?).   Extremities--  Right calf slightly larger than the left by 2 cm, calf is not tender to palpation or red. Good femoral pulses bilaterally. Decreased but palpable pedal pulses bilaterally. Toes are not cold. Psych-- Cognition and judgment appear intact. Alert and cooperative with normal attention span and concentration.  not anxious appearing and not depressed appearing.       Assessment & Plan:  Today , I spent more than 30 minutes with the patient, >50% of the time counseling about compliance, risks of uncontrolled diabetes and explaining in detail the plan of care.

## 2012-09-25 ENCOUNTER — Encounter: Payer: Self-pay | Admitting: Internal Medicine

## 2012-09-27 ENCOUNTER — Telehealth: Payer: Self-pay | Admitting: *Deleted

## 2012-09-27 ENCOUNTER — Telehealth: Payer: Self-pay | Admitting: Internal Medicine

## 2012-09-27 NOTE — Telephone Encounter (Signed)
Patient states we are supposed to be referring him to a foot doctor for his ulcer. I do not see anything in the chart. Please advise.

## 2012-09-27 NOTE — Telephone Encounter (Signed)
Please advise 

## 2012-09-27 NOTE — Telephone Encounter (Signed)
He already has an appointment w/ the wound care center, thay will take care of the foot wound

## 2012-09-27 NOTE — Telephone Encounter (Signed)
Message copied by Jeannine Kitten on Mon Sep 27, 2012 10:59 AM ------      Message from: Wanda Plump      Created: Sat Sep 25, 2012  5:58 PM      Regarding: needs f/u       I saw the patient Friday, adjusted his Coumadin, he needs a follow a him 5 days. Could you contact him? He has a history of poor compliance with appointments that we have to talk about that. Hopefully he will comply. ------

## 2012-09-27 NOTE — Telephone Encounter (Signed)
Talked with pts wife and she states pt is asleep and not feeling well and to make his appt for Thursday April 10th  around 2:30pm as has wound center appt on that day as well and she will call if unable to keep this appt

## 2012-09-27 NOTE — Telephone Encounter (Signed)
Discussed with pt

## 2012-09-27 NOTE — Telephone Encounter (Signed)
Message copied by Jeannine Kitten on Mon Sep 27, 2012 10:55 AM ------      Message from: Wanda Plump      Created: Sat Sep 25, 2012  5:58 PM      Regarding: needs f/u       I saw the patient Friday, adjusted his Coumadin, he needs a follow a him 5 days. Could you contact him? He has a history of poor compliance with appointments that we have to talk about that. Hopefully he will comply. ------

## 2012-09-29 ENCOUNTER — Encounter (INDEPENDENT_AMBULATORY_CARE_PROVIDER_SITE_OTHER): Payer: Medicare Other | Admitting: *Deleted

## 2012-09-29 ENCOUNTER — Telehealth: Payer: Self-pay | Admitting: *Deleted

## 2012-09-29 ENCOUNTER — Ambulatory Visit: Payer: Medicare Other | Admitting: Endocrinology

## 2012-09-29 DIAGNOSIS — I739 Peripheral vascular disease, unspecified: Secondary | ICD-10-CM

## 2012-09-29 DIAGNOSIS — Z48812 Encounter for surgical aftercare following surgery on the circulatory system: Secondary | ICD-10-CM

## 2012-09-29 NOTE — Telephone Encounter (Signed)
Message copied by Nada Maclachlan on Wed Sep 29, 2012  4:15 PM ------      Message from: Willow Ora E      Created: Sat Sep 25, 2012  6:00 PM       Please check on him, I ordered several things:      INR rechecked?      Appointment to see Dr.Gherge set? Endocrinology      did he keep the appointment with the wound Center? ------

## 2012-09-29 NOTE — Telephone Encounter (Signed)
Pt states that he saw vascular today & his left leg was completely blocked.  Pt has an appt tomorrow with the wound care center & he will have his INR checked then.  Pt has an appt to see Dr. Lafe Garin on 4.16.14.

## 2012-09-29 NOTE — Telephone Encounter (Signed)
Thank you for the update!

## 2012-09-30 ENCOUNTER — Encounter (HOSPITAL_BASED_OUTPATIENT_CLINIC_OR_DEPARTMENT_OTHER): Payer: Medicare Other | Attending: Internal Medicine

## 2012-09-30 ENCOUNTER — Ambulatory Visit (INDEPENDENT_AMBULATORY_CARE_PROVIDER_SITE_OTHER): Payer: Medicare Other | Admitting: *Deleted

## 2012-09-30 DIAGNOSIS — I359 Nonrheumatic aortic valve disorder, unspecified: Secondary | ICD-10-CM

## 2012-09-30 DIAGNOSIS — Z794 Long term (current) use of insulin: Secondary | ICD-10-CM | POA: Insufficient documentation

## 2012-09-30 DIAGNOSIS — L97409 Non-pressure chronic ulcer of unspecified heel and midfoot with unspecified severity: Secondary | ICD-10-CM | POA: Diagnosis not present

## 2012-09-30 DIAGNOSIS — S98139A Complete traumatic amputation of one unspecified lesser toe, initial encounter: Secondary | ICD-10-CM | POA: Insufficient documentation

## 2012-09-30 DIAGNOSIS — Z7901 Long term (current) use of anticoagulants: Secondary | ICD-10-CM

## 2012-09-30 DIAGNOSIS — E1169 Type 2 diabetes mellitus with other specified complication: Secondary | ICD-10-CM | POA: Insufficient documentation

## 2012-10-01 NOTE — Progress Notes (Signed)
[facility]  NAMEKHOA, OPDAHL                ACCOUNT NO.:  [id number]  MEDICAL RECORD NO.:  [id number]      DATE OF BIRTH:  Jan 08, 1935  PHYSICIAN:  Maxwell Caul, M.D.      VISIT DATE:                                  OFFICE VISIT   HISTORY OF PRESENT ILLNESS:  Mr. Wile is a 78 year old man kindly referred by his primary physician, Dr. Willow Ora for review of a wound on his right heel.  The patient tells me that he was in his usual state until a month ago and noticed blood on his sock and a open area on the right medial heel.  In spite of the fact that he had a previous first toe amputation on the other side, he seems to have delayed seeing physicians.  In any case, he was finally reviewed by Dr. Drue Novel on April 5 and the area on his medial heel was noted.  He had by this time an eschar covering this but has developed a considerable degree of pain around the wound in this time frame.  An x-ray of the heel was done in Dr. Drue Novel office.  This did not show evidence of osteomyelitis, noted a posterior heel ulceration.  Recent chest x-ray showed stable cardiac enlargement with perihilar infiltrates versus edema.  I do not believe the area was cultured, however, he was started on Keflex.  PAST MEDICAL HISTORY: 1. Extensive and includes obstructive sleep apnea, not using CPAP,     nephrolithiasis, hypertension, aortic stenosis, status post     bioprosthetic replacement in 2006, atrial fibrillation, status post     CABG.  He is on Coumadin [facility]. 2. History of congestive heart failure. 3. VVI pacemaker. 4. Known peripheral vascular disease and I think the patient actually     had arterial evaluation yesterday at [facility] but this is not     currently available. 5. Hyperlipidemia. 6. Previous left great toe amputation. 7. Type 2 diabetes.  MEDICATION LIST:  Reviewed.  He is on, 1. Aspirin 81 daily. 2. Vytorin at bedtime. 3. Lasix 80  daily. 4. Lantus 20 daily. 5. Apidra sliding scale at mealtime. 6. Lisinopril 5 daily. 7. Metformin 500 b.i.d. 8. Metoprolol 12.5 b.i.d. 9. Oxycodone 5 q. p.r.n. 10.Spironolactone 25 daily. 11.Warfarin 5 daily. 12.Keflex 500, 4 times a day.  PHYSICAL EXAMINATION:  Temperature is 97, pulse 80, respirations 18, blood pressure 139/75.  The area in question was on his right medial heel.  This measured 0.9 x 1.2 x 0.2.  It was covered with a reasonably thick eschar and surrounding callus, which was in circumferential fashion. Surrounding the wound for a considerable degree was erythema and really intense tenderness suggestive of surrounding infection. Peripheral pulses were clearly palpable at the popliteal on the right. The area was anesthetized with topical lidocaine and an extensive surgical debridement removing the eschar, necrotic subcutaneous tissue. After debridement, the area was cultured.  I have added doxycycline to the Keflex already prescribed by Dr. Drue Novel.  Underlying x-rays as noted above was negative for osteomyelitis.  IMPRESSION:  Wegner's grade 3 diabetic foot ulcer, medial right heel debrided as described, cultured.  Doxycycline added to Keflex.  We dressed this with silver alginate, a  foam heel cup wrapped with Kerlix and net.  We will check this in the clinic on Monday or Tuesday. Awaiting [place]'s arterial Dopplers and ABIs, which were done yesterday.  After we are more certain about the need for further debridement and/or the infection status, ultimately I think this will need a total contact cast, however, for now he was put in a healing sandal and the wound dressed as above.  We will see him again on Tuesday of next week and then 1 week from today.          ______________________________ Maxwell Caul, M.D.     MGR/MEDQ  D:  09/30/2012  T:  10/01/2012  Job:  [id number]

## 2012-10-06 ENCOUNTER — Ambulatory Visit: Payer: Medicare Other | Admitting: Endocrinology

## 2012-10-07 ENCOUNTER — Ambulatory Visit (INDEPENDENT_AMBULATORY_CARE_PROVIDER_SITE_OTHER): Payer: Medicare Other | Admitting: Pharmacist

## 2012-10-07 DIAGNOSIS — Z794 Long term (current) use of insulin: Secondary | ICD-10-CM | POA: Diagnosis not present

## 2012-10-07 DIAGNOSIS — I359 Nonrheumatic aortic valve disorder, unspecified: Secondary | ICD-10-CM

## 2012-10-07 DIAGNOSIS — L97409 Non-pressure chronic ulcer of unspecified heel and midfoot with unspecified severity: Secondary | ICD-10-CM | POA: Diagnosis not present

## 2012-10-07 DIAGNOSIS — Z7901 Long term (current) use of anticoagulants: Secondary | ICD-10-CM

## 2012-10-07 DIAGNOSIS — E1169 Type 2 diabetes mellitus with other specified complication: Secondary | ICD-10-CM | POA: Diagnosis not present

## 2012-10-07 DIAGNOSIS — S98139A Complete traumatic amputation of one unspecified lesser toe, initial encounter: Secondary | ICD-10-CM | POA: Diagnosis not present

## 2012-10-07 LAB — POCT INR: INR: 2.2

## 2012-10-11 ENCOUNTER — Encounter: Payer: Self-pay | Admitting: *Deleted

## 2012-10-11 ENCOUNTER — Ambulatory Visit: Payer: Medicare Other | Admitting: Surgery

## 2012-10-13 ENCOUNTER — Telehealth: Payer: Self-pay

## 2012-10-13 ENCOUNTER — Ambulatory Visit (INDEPENDENT_AMBULATORY_CARE_PROVIDER_SITE_OTHER): Payer: Medicare Other | Admitting: *Deleted

## 2012-10-13 ENCOUNTER — Ambulatory Visit: Payer: Medicare Other | Admitting: Endocrinology

## 2012-10-13 DIAGNOSIS — Z7901 Long term (current) use of anticoagulants: Secondary | ICD-10-CM

## 2012-10-13 DIAGNOSIS — I359 Nonrheumatic aortic valve disorder, unspecified: Secondary | ICD-10-CM | POA: Diagnosis not present

## 2012-10-13 NOTE — Telephone Encounter (Signed)
Pt. and wife called to question "why he wasn't given appt. until 11/08/12, when he had a test that showed a blockage in his left leg".   Triaged pt's symptoms.  States has been going to the [facility] for a sore on his right heel.  Noted results of left LE Art. Duplex on 09/29/12 showed occluded left SFA stent.  Questioned symptoms in left lower extremity; denied pain, or any change in sensation, or change in color / temperature of left LE; denied mobility problems, denied open sores of left LE.  Noted that pt. had appt. 10/11/12, and had to cancel this due to another appt. that, wife said, couldn't be changed.  Advised pt. and wife will call back to move appt. to an earlier date.  Verb. Understanding.

## 2012-10-20 ENCOUNTER — Encounter: Payer: Self-pay | Admitting: Endocrinology

## 2012-10-20 ENCOUNTER — Ambulatory Visit (INDEPENDENT_AMBULATORY_CARE_PROVIDER_SITE_OTHER): Payer: Medicare Other | Admitting: Endocrinology

## 2012-10-20 VITALS — BP 128/80 | HR 86 | Wt 217.0 lb

## 2012-10-20 DIAGNOSIS — E119 Type 2 diabetes mellitus without complications: Secondary | ICD-10-CM

## 2012-10-20 NOTE — Patient Instructions (Addendum)
good diet and exercise habits significanly improve the control of your diabetes.  please let me know if you wish to be referred to a dietician.  high blood sugar is very risky to your health.  you should see an eye doctor every year.  You are at higher than average risk for pneumonia and hepatitis-B.  You should be vaccinated against both.   controlling your blood pressure and cholesterol drastically reduces the damage diabetes does to your body.  this also applies to quitting smoking.  please discuss these with your doctor.  you should take an aspirin every day, unless you have been advised by a doctor not to.  check your blood sugar 2 times a day.  vary the time of day when you check, between before the 3 meals, and at bedtime.  also check if you have symptoms of your blood sugar being too high or too low.  please keep a record of the readings and bring it to your next appointment here.  please call us sooner if your blood sugar goes below 70, or if you have a lot of readings over 200.   Try drawing up 1 week of insulin at a time.  That way you can make sure it gets taken each day. Please stop the metformin and apidra, to simplify your medication schedule.   Please come back for a follow-up appointment in 2 weeks.  we will need to take this complex situation in stages.

## 2012-10-20 NOTE — Progress Notes (Signed)
Subjective:    Patient ID: [name], male    DOB: 02-15-1935, 78 y.o.   MRN: [id number]  HPI pt states 20 years h/o dm.  he is unaware of any chronic complications.  it is complicated by peripheral sensory neuropathy, retinopathy, foot ulcer, CAD and PAD.  he has been on insulin x 15 years.  pt says his diet is "fair," but exercise is "none."  Pt reports few years of moderate numbness of the feet, but no assoc pain.  Pt was seeing dr Talmage Nap.  a1c was high, but wife says this was due to pt missing insulin injections.   Past Medical History  Diagnosis Date  . Erectile dysfunction   . Obesity   . OSA (obstructive sleep apnea)     not using CPAP  . History of nephrolithiasis   . HTN (hypertension)   . Aortal stenosis 2006    biophrostetic  . Atrial fibrillation     onset after CABG--- [facility]mad[facility]  . S/P aortic valve replacement   . CHF (congestive heart failure)     abter CABG  . Pacemaker 2/07    VVI  . PVD (peripheral vascular disease)     CT angio showed- vascular insuff, intestine and RAS bilaterally, andgiogram 02/2009 severe PVD medical management  . Skin cancer     surgery (nose) 09-2010  . Sick sinus syndrome     [name]  . Hyperlipidemia   . Arthritis   . Gangrene   . Coronary artery disease   . DM type 2 (diabetes mellitus, type 2)     used to see Dr Roanna Raider, now f/u by Dr Drue Novel    Past Surgical History  Procedure Laterality Date  . Aortic valve replacement      biophrostetic for Ao Stenosis 2006  . Coronary artery bypass graft  2006  . Tonsillectomy    . Cataract extraction, bilateral    . Amputation  01/2010    great toe  . Pacemaker placement  07/2005    Medtronic Sigma SR implanted by Dr Juanda Chance  . Aortic valve replacement  01/17/2011    S/P redo median sternotomy, extracorporeal circulation, redo Aortic Valve Replacement using a 23-mm Edwards pericardial Magna-Ease valve, Dr Concepcion Living  . Angioplasty / stenting  femoral  02/10/10    Left femoral    History   Social History  . Marital Status: Married    Spouse Name: N/A    Number of Children: 2  . Years of Education: N/A   Occupational History  . 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 Smoking status: Former Smoker    Types: Cigarettes    Quit date: 06/23/1988  . Smokeless tobacco: Former Neurosurgeon  . Alcohol Use: No  . Drug Use: No  . Sexually Active: Not Currently   Other Topics Concern  . Not on file   Social History Narrative   Diet: healthy most of the time--   2 step children                Current Outpatient Prescriptions on File Prior to Visit  Medication Sig Dispense Refill  . aspirin EC 81 MG tablet Take 81 mg by mouth daily.      . cephALEXin (KEFLEX) 500 MG capsule Take 1 capsule (500 mg total) by mouth 4 (four) times daily.  40 capsule  0  . ezetimibe-simvastatin (VYTORIN) 10-40 MG per tablet Take  1 tablet by mouth at bedtime.  30 tablet  3  . furosemide (LASIX) 80 MG tablet Take 160mg  everyday PLUS an extra 80mg  every other day at 4PM  120 tablet  3  . insulin glargine (LANTUS) 100 UNIT/ML injection Inject 80 Units into the skin at bedtime.       . insulin glulisine (APIDRA) 100 UNIT/ML injection Inject into the skin. 5 units before each meal      . lisinopril (PRINIVIL,ZESTRIL) 5 MG tablet take 1 tablet by mouth once daily for high blood pressure  30 tablet  9  . metFORMIN (GLUCOPHAGE-XR) 500 MG 24 hr tablet Take 500 mg by mouth 2 (two) times daily.      . potassium chloride SA (K-DUR,KLOR-CON) 20 MEQ tablet Take 1 tablet (20 mEq total) by mouth 2 (two) times daily.  60 tablet  1  . spironolactone (ALDACTONE) 25 MG tablet take 1 tablet by mouth once daily  90 tablet  3  . warfarin (COUMADIN) 5 MG tablet        No current facility-administered medications on file prior to visit.    Allergies  Allergen Reactions  . Metoprolol Succinate   . Potassium-Containing Compounds      IV--loss of conciousness    Family History  Problem Relation Age of Onset  . Heart attack Father 12  . Stroke Mother 89  . Heart disease Sister 30     also stomach aneurysm  . Cancer Neg Hx     colon, prostate  . Diabetes Sister     2 sister w/ DM  . Colon cancer Neg Hx   . Prostate cancer Neg Hx    BP 128/80  Pulse 86  Wt 217 lb (98.431 kg)  BMI 33 kg/m2  SpO2 93%  Review of Systems  Constitutional: Negative for fever.  denies blurry vision, headache, chest pain, n/v, urinary frequency, cramps, excessive diaphoresis, memory loss, rhinorrhea, and easy bruising.   He has hearing loss, memory loss, ED sxs, and doe. He has lost 65 lbs x a few years    Objective:   Physical Exam VS: see vs page GEN: no distress HEAD: head: no deformity eyes: no periorbital swelling, no proptosis external nose and ears are normal mouth: no lesion seen NECK: supple, thyroid is not enlarged CHEST WALL: no deformity LUNGS: clear to auscultation BREASTS:  No gynecomastia CV: reg rate and rhythm, no murmur ABD: abdomen is soft, nontender.  no hepatosplenomegaly.  not distended.  no hernia GENITALIA:  Normal male.   RECTAL: normal external and internal exam.  heme neg. PROSTATE:  Normal size.  No nodule MUSCULOSKELETAL: muscle bulk and strength are grossly normal.  no obvious joint swelling.  gait is normal and steady EXTEMITIES: no deformity.  no ulcer on the feet.  feet are of normal color and temp.  no edema PULSES: dorsalis pedis intact bilat.  no carotid bruit NEURO:  cn 2-12 grossly intact.   readily moves all 4's.  sensation is intact to touch on the feet SKIN:  Normal texture and temperature.  No rash or suspicious lesion is visible.   NODES:  None palpable at the neck PSYCH: alert, oriented x3.  Does not appear anxious nor depressed.  Lab Results  Component Value Date   HGBA1C 12.4* 08/04/2012      Assessment & Plan:  DM: therapy limited by noncompliance with insulin  injections.  This causes high risk to his health.  i'll do the best i can.  We discussed this problem.  Wife says she will start drawing up 1 week of insulin doses at a time, and start verifying that he takes insulin each day, by standing there until he takes it. Weight loss, prob due to severe hypoglycemia. Memory loss.  This complicates the rx of DM. Numbness, prob due to DM

## 2012-10-21 ENCOUNTER — Encounter (HOSPITAL_BASED_OUTPATIENT_CLINIC_OR_DEPARTMENT_OTHER): Payer: Medicare Other | Attending: Internal Medicine

## 2012-10-21 DIAGNOSIS — L97509 Non-pressure chronic ulcer of other part of unspecified foot with unspecified severity: Secondary | ICD-10-CM | POA: Diagnosis not present

## 2012-10-21 DIAGNOSIS — E1169 Type 2 diabetes mellitus with other specified complication: Secondary | ICD-10-CM | POA: Insufficient documentation

## 2012-10-21 DIAGNOSIS — B9689 Other specified bacterial agents as the cause of diseases classified elsewhere: Secondary | ICD-10-CM | POA: Diagnosis not present

## 2012-10-21 HISTORY — PX: INCISE AND DRAIN ABCESS: PRO64

## 2012-10-28 ENCOUNTER — Other Ambulatory Visit (HOSPITAL_BASED_OUTPATIENT_CLINIC_OR_DEPARTMENT_OTHER): Payer: Self-pay | Admitting: Internal Medicine

## 2012-10-28 DIAGNOSIS — L97409 Non-pressure chronic ulcer of unspecified heel and midfoot with unspecified severity: Secondary | ICD-10-CM | POA: Diagnosis not present

## 2012-10-28 DIAGNOSIS — T07XXXA Unspecified multiple injuries, initial encounter: Secondary | ICD-10-CM | POA: Diagnosis not present

## 2012-10-28 DIAGNOSIS — L98499 Non-pressure chronic ulcer of skin of other sites with unspecified severity: Secondary | ICD-10-CM

## 2012-10-28 DIAGNOSIS — E1169 Type 2 diabetes mellitus with other specified complication: Secondary | ICD-10-CM | POA: Diagnosis not present

## 2012-10-28 DIAGNOSIS — B9689 Other specified bacterial agents as the cause of diseases classified elsewhere: Secondary | ICD-10-CM | POA: Diagnosis not present

## 2012-10-28 DIAGNOSIS — L97509 Non-pressure chronic ulcer of other part of unspecified foot with unspecified severity: Secondary | ICD-10-CM | POA: Diagnosis not present

## 2012-10-29 ENCOUNTER — Other Ambulatory Visit (HOSPITAL_COMMUNITY): Payer: Self-pay | Admitting: Internal Medicine

## 2012-10-29 ENCOUNTER — Ambulatory Visit (HOSPITAL_COMMUNITY)
Admission: RE | Admit: 2012-10-29 | Discharge: 2012-10-29 | Disposition: A | Discharge status: Home / Self Care | Payer: Medicare Other | Source: Ambulatory Visit | Attending: Internal Medicine | Admitting: Internal Medicine

## 2012-10-29 ENCOUNTER — Encounter (HOSPITAL_COMMUNITY): Payer: Self-pay

## 2012-10-29 ENCOUNTER — Encounter: Payer: Self-pay | Admitting: Vascular Surgery

## 2012-10-29 ENCOUNTER — Ambulatory Visit (INDEPENDENT_AMBULATORY_CARE_PROVIDER_SITE_OTHER): Payer: Medicare Other | Admitting: *Deleted

## 2012-10-29 DIAGNOSIS — L97509 Non-pressure chronic ulcer of other part of unspecified foot with unspecified severity: Secondary | ICD-10-CM | POA: Diagnosis not present

## 2012-10-29 DIAGNOSIS — Z950 Presence of cardiac pacemaker: Secondary | ICD-10-CM | POA: Insufficient documentation

## 2012-10-29 DIAGNOSIS — E119 Type 2 diabetes mellitus without complications: Secondary | ICD-10-CM | POA: Diagnosis not present

## 2012-10-29 DIAGNOSIS — Y849 Medical procedure, unspecified as the cause of abnormal reaction of the patient, or of later complication, without mention of misadventure at the time of the procedure: Secondary | ICD-10-CM | POA: Insufficient documentation

## 2012-10-29 DIAGNOSIS — I359 Nonrheumatic aortic valve disorder, unspecified: Secondary | ICD-10-CM

## 2012-10-29 DIAGNOSIS — T8189XA Other complications of procedures, not elsewhere classified, initial encounter: Secondary | ICD-10-CM | POA: Diagnosis not present

## 2012-10-29 DIAGNOSIS — Z7901 Long term (current) use of anticoagulants: Secondary | ICD-10-CM

## 2012-10-29 DIAGNOSIS — L98499 Non-pressure chronic ulcer of skin of other sites with unspecified severity: Secondary | ICD-10-CM | POA: Insufficient documentation

## 2012-10-29 MED ORDER — IOHEXOL 300 MG/ML  SOLN
100.0000 mL | Freq: Once | INTRAMUSCULAR | Status: AC | PRN
  Administered 2012-10-29: 100 mL via INTRAVENOUS

## 2012-11-01 ENCOUNTER — Encounter: Payer: Self-pay | Admitting: Surgery

## 2012-11-01 ENCOUNTER — Ambulatory Visit (INDEPENDENT_AMBULATORY_CARE_PROVIDER_SITE_OTHER): Payer: Medicare Other | Admitting: Surgery

## 2012-11-01 ENCOUNTER — Telehealth: Payer: Self-pay | Admitting: *Deleted

## 2012-11-01 VITALS — BP 104/63 | HR 64 | Resp 16 | Ht 68.0 in | Wt 221.0 lb

## 2012-11-01 DIAGNOSIS — R202 Paresthesia of skin: Secondary | ICD-10-CM

## 2012-11-01 DIAGNOSIS — R209 Unspecified disturbances of skin sensation: Secondary | ICD-10-CM | POA: Diagnosis not present

## 2012-11-01 DIAGNOSIS — I739 Peripheral vascular disease, unspecified: Secondary | ICD-10-CM

## 2012-11-01 NOTE — Telephone Encounter (Signed)
Message copied by Carmela Hurt on Mon Nov 01, 2012  4:10 PM ------      Message from: Carmela Hurt      Created: Mon Nov 01, 2012  3:53 PM      Regarding: [place] [facility] FOR PROCEDURE 11/10/2012       Patient does not have medication coverage, cost for enoxaparin is $700.00, we are trying to complete forms for patient assistance presently, will keep you update.  ------

## 2012-11-01 NOTE — Progress Notes (Signed)
[facility] [place]   Patient name: [name] MRN: [id number] DOB: 09/11/1934 Sex: male   Referred by: Dr. Robson  Reason for referral:  Chief Complaint  Patient presents with  . PVD    C/O Left foot tingling, duration 4-6 mo.    HISTORY OF PRESENT ILLNESS: The patient comes in today with a new problem, a right heel ulcer x2. The patient is well known to me, having undergone left superficial femoral artery stenting and subintimal recanalization of an occluded left anterior tibial artery with balloon angioplasty in 2011. This was done in the setting of an ischemic left toe which ultimately required amputation. He now states that he has had a right heel ulcer for the past month and a half. He has undergone debridement at the wound center. A recent CT scan shows no evidence of osteomyelitis or soft tissue abscess. He is performing daily wound care. The patient also states that his left great toe amputation site is starting to breakdown. This is been present for about one month. He is not having fevers or chills. He does not have to perform wound care on the left great toe.  The patient is a brittle diabetic. He states that his blood sugars are in the 200 range. He also suffers from coronary artery disease having undergone coronary artery bypass grafting as well as aortic valve replacement. He is currently on Coumadin for his aortic valve. He is medically managed for his hypertension. He is on an ACE inhibitor. He states that his blood pressures are in the low 100 range systolic. He carries a diagnosis of hyperlipidemia, but is not on a statin currently. He is a former smoker, he quit in 1990  Past Medical History  Diagnosis Date  . Erectile dysfunction   . Obesity   . OSA (obstructive sleep apnea)     not using CPAP  . History of nephrolithiasis   . HTN (hypertension)   . Aortal stenosis 2006    biophrostetic  . Atrial fibrillation     onset after CABG---  [facility]  . S/P aortic valve replacement   . CHF (congestive heart failure)     abter CABG  . Pacemaker 2/07    VVI  . PVD (peripheral vascular disease)     CT angio showed- vascular insuff, intestine and RAS bilaterally, andgiogram 02/2009 severe PVD medical management  . Skin cancer     surgery (nose) 09-2010  . Sick sinus syndrome     [name]  . Hyperlipidemia   . Arthritis   . Gangrene   . Coronary artery disease   . DM type 2 (diabetes mellitus, type 2)     used to see Dr Doerr, now f/u by Dr Paz    Past Surgical History  Procedure Laterality Date  . Aortic valve replacement      biophrostetic for Ao Stenosis 2006  . Coronary artery bypass graft  2006  . Tonsillectomy    . Cataract extraction, bilateral    . Amputation  01/2010    great toe  . Pacemaker placement  07/2005    Medtronic Sigma SR implanted by Dr Brodie  . Aortic valve replacement  01/17/2011    S/P redo median sternotomy, extracorporeal circulation, redo Aortic Valve Replacement using a 23-mm Edwards pericardial Magna-Ease valve, Dr Byran Bartle  . Angioplasty / stenting femoral  02/10/10    Left femoral  . Incise and drain abcess Right May   2014    right heel     History   Social History  . Marital Status: Married    Spouse Name: N/A    Number of Children: 2  . Years of Education: N/A   Occupational History  . retired- long distance truck driver     Social History Main Topics  . Smoking status: Former Smoker    Types: Cigarettes    Quit date: 06/23/1988  . Smokeless tobacco: Former User  . Alcohol Use: No  . Drug Use: No  . Sexually Active: Not Currently   Other Topics Concern  . Not on file   Social History Narrative   Diet: healthy most of the time--   2 step children                Family History  Problem Relation Age of Onset  . Heart attack Father 53  . Stroke Mother 59  . Heart disease Sister 63     also stomach aneurysm  .  Cancer Neg Hx     colon, prostate  . Diabetes Sister     2 sister w/ DM  . Colon cancer Neg Hx   . Prostate cancer Neg Hx     Allergies as of 11/01/2012 - Review Complete 11/01/2012  Allergen Reaction Noted  . Metoprolol succinate  12/23/2006  . Potassium-containing compounds  09/26/2010    Current Outpatient Prescriptions on File Prior to Visit  Medication Sig Dispense Refill  . aspirin EC 81 MG tablet Take 81 mg by mouth daily.      . ezetimibe-simvastatin (VYTORIN) 10-40 MG per tablet Take 1 tablet by mouth at bedtime.  30 tablet  3  . furosemide (LASIX) 80 MG tablet Take 160mg everyday PLUS an extra 80mg every other day at 4PM  120 tablet  3  . insulin glargine (LANTUS) 100 UNIT/ML injection Inject 80 Units into the skin at bedtime.       . lisinopril (PRINIVIL,ZESTRIL) 5 MG tablet take 1 tablet by mouth once daily for high blood pressure  30 tablet  9  . potassium chloride SA (K-DUR,KLOR-CON) 20 MEQ tablet Take 1 tablet (20 mEq total) by mouth 2 (two) times daily.  60 tablet  1  . spironolactone (ALDACTONE) 25 MG tablet take 1 tablet by mouth once daily  90 tablet  3  . warfarin (COUMADIN) 5 MG tablet       . cephALEXin (KEFLEX) 500 MG capsule Take 1 capsule (500 mg total) by mouth 4 (four) times daily.  40 capsule  0   No current facility-administered medications on file prior to visit.     REVIEW OF SYSTEMS: Cardiovascular: No chest pain, chest pressure, palpitations, orthopnea, or dyspnea on exertion. No claudication or rest pain,  No history of DVT or phlebitis. Pulmonary: No productive cough, asthma or wheezing. Neurologic: No weakness, paresthesias, aphasia, or amaurosis. No dizziness. Hematologic: On Coumadin Musculoskeletal: No joint pain or joint swelling. Gastrointestinal: No blood in stool or hematemesis Genitourinary: No dysuria or hematuria. Psychiatric:: No history of major depression. Integumentary: Right heel ulcer, breakdown of left great toe  amputation Constitutional: No fever or chills.  PHYSICAL EXAMINATION: General: The patient appears their stated age.  Vital signs are BP 104/63  Pulse 64  Resp 16  Ht 5' 8" (1.727 m)  Wt 221 lb (100.245 kg)  BMI 33.61 kg/m2  SpO2 91% HEENT:  No gross abnormalities Pulmonary: Respirations are non-labored Musculoskeletal: There are no major deformities.   Neurologic:   No focal weakness or paresthesias are detected, Skin: There are no ulcer or rashes noted. 3 mm ulcer x2 on the right heel. Eschar at the distal extent of the left great toe amputation without erythema Psychiatric: The patient has normal affect. Cardiovascular: There is a regular rate and rhythm without significant murmur appreciated. Pedal pulses are not palpable  Diagnostic Studies: Toe pressure on the right is 52. He cannot be calculated on the left to do to the absence of the great toe. The left superficial femoral artery stents are occluded   Medication Changes: Coumadin will need to be held for 5 days prior to his angiogram. This will be discussed with his cardiologist. He will need a Lovenox bridge  Assessment:  Right heel ulcer Breakdown of left great toe amputation Plan: I discussed with the patient that this is a limb threatening problem. I have recommended being very aggressive in trying to improve his blood flow to both lower extremities to help avoid amputation. He has previously had stenting of his left superficial femoral artery. The stent has now occluded. I do not feel that he will have percutaneous options on the left leg for that reason I have recommended abdominal aortogram with bilateral runoff. I will plan on accessing the left groin. I will intervene on the right lower extremity if he has a lesion amenable to percutaneous intervention. I will get diagnostic images of the left leg so that we can plan surgical revascularization. This is been scheduled for Wednesday, May 21     V. Wells Brabham IV,  M.D. [facility] [place] Office: [phone] Pager:  [phone]   

## 2012-11-02 ENCOUNTER — Other Ambulatory Visit: Payer: Self-pay

## 2012-11-03 ENCOUNTER — Encounter (HOSPITAL_COMMUNITY): Payer: Self-pay | Admitting: Pharmacy Technician

## 2012-11-03 ENCOUNTER — Ambulatory Visit: Payer: Medicare Other | Admitting: Endocrinology

## 2012-11-04 ENCOUNTER — Telehealth: Payer: Self-pay | Admitting: Cardiology

## 2012-11-04 DIAGNOSIS — L97509 Non-pressure chronic ulcer of other part of unspecified foot with unspecified severity: Secondary | ICD-10-CM | POA: Diagnosis not present

## 2012-11-04 DIAGNOSIS — B9689 Other specified bacterial agents as the cause of diseases classified elsewhere: Secondary | ICD-10-CM | POA: Diagnosis not present

## 2012-11-04 DIAGNOSIS — E1169 Type 2 diabetes mellitus with other specified complication: Secondary | ICD-10-CM | POA: Diagnosis not present

## 2012-11-04 NOTE — Telephone Encounter (Signed)
Talked with Mindi Junker at [employer] and she reviewed pts request and states that the investigation has been done and that will be sent to pt System to evaluate and she will know if pt is approved today and she states she will fax [place] the approval and then she said would have to go to [employer] and will be delivered in 2 to 3 days . Informed that we needed to have Lovenox  started on Saturday . Later in morning received confirmation per fax that they had approved Lovenox and will be sent to our office in 2 to 3 days . Dr Weston Brass aware of above

## 2012-11-04 NOTE — Telephone Encounter (Signed)
Pt has been approved for Lovenox and did call and talk with pts wife and made an appt for him to be seen in clinic tomorrow and will instruct regarding his lovenox injections and pts wife verbalized understanding. Pts wife did inform nurse that he took 1 and 1/2 tablets of coumadin today on 11/04/2012 which is extra 1/2 tablet and did instruct pts wife that he is to hold coumadin after today and then will give further instructions when seen tomorrow.

## 2012-11-04 NOTE — Telephone Encounter (Signed)
See telephone note of 11/04/2012

## 2012-11-04 NOTE — Telephone Encounter (Signed)
New Prob      Calling to see if prior approval is possible for a blood thinner. Please call.

## 2012-11-05 ENCOUNTER — Ambulatory Visit (INDEPENDENT_AMBULATORY_CARE_PROVIDER_SITE_OTHER): Payer: Medicare Other | Admitting: *Deleted

## 2012-11-05 DIAGNOSIS — I359 Nonrheumatic aortic valve disorder, unspecified: Secondary | ICD-10-CM

## 2012-11-05 DIAGNOSIS — Z7901 Long term (current) use of anticoagulants: Secondary | ICD-10-CM

## 2012-11-05 NOTE — Patient Instructions (Addendum)
No not take any more coumadin  11/06/12- No Nothing  11/07/12- No Nothing  11/08/12- Start Lovenox 100mg  injection Subcutaneously into abdomen, 1 injection at 10 AM and 10 PM.  11/09/12- Do 1 Lovenox 100mg  injection this morning at 10AM ONLY  11/10/12- Day of Procedure. Restart coumadin and Lovenox after procedure when Dr Myra Gianotti has given the OK, possible tonight after                Procedure. Take an extra 1/2 pill on top of normal dose for 2 days then resume regular dose. Restart Lovenox 100mg s injections every 12               Hours after procedure per Dr Myra Gianotti. Continue taking Lovenox and coumadin until follow [facility] appt on 11/16/12 at 2:45pm

## 2012-11-05 NOTE — Progress Notes (Signed)
Do Not take any more coumadin until after Procedure 11/06/12- Do Nothing  11/07/12-Do Nothing  11/08/12- Start Lovenox 100mg   injection subcutaneously into abdomen, 1 injection at 10 am and 1 injection at  10 pm 11/09/12- Do 1 Lovenox 100mg  injection subcutaneously into abdomen at 10 am ONLY  11/10/12- Day of procedure... . Restart coumadin and Lovenox after procedure when ok with Dr Myra Gianotti, probably that night after procedure.                         Take an extra  1/2  Pill on top of normal dose for 2 days of coumadin. Restart Lovenox 100mg  every 12 hours also when ok with                   Dr Myra Gianotti. Continue Lovenox and coumadin until next appt with coumadin clinic on 11/16/12 at 2:45pm

## 2012-11-08 ENCOUNTER — Ambulatory Visit: Payer: Medicare Other | Admitting: Surgery

## 2012-11-10 ENCOUNTER — Ambulatory Visit (HOSPITAL_COMMUNITY)
Admission: RE | Admit: 2012-11-10 | Discharge: 2012-11-10 | Disposition: A | Discharge status: Home / Self Care | Payer: Medicare Other | Source: Ambulatory Visit | Attending: Surgery | Admitting: Surgery

## 2012-11-10 ENCOUNTER — Encounter (HOSPITAL_COMMUNITY)
Admission: RE | Disposition: A | Discharge status: Home / Self Care | Payer: Self-pay | Source: Ambulatory Visit | Attending: Surgery

## 2012-11-10 DIAGNOSIS — L97409 Non-pressure chronic ulcer of unspecified heel and midfoot with unspecified severity: Secondary | ICD-10-CM | POA: Diagnosis not present

## 2012-11-10 DIAGNOSIS — I7092 Chronic total occlusion of artery of the extremities: Secondary | ICD-10-CM | POA: Insufficient documentation

## 2012-11-10 DIAGNOSIS — I2510 Atherosclerotic heart disease of native coronary artery without angina pectoris: Secondary | ICD-10-CM | POA: Diagnosis not present

## 2012-11-10 DIAGNOSIS — G4733 Obstructive sleep apnea (adult) (pediatric): Secondary | ICD-10-CM | POA: Insufficient documentation

## 2012-11-10 DIAGNOSIS — I10 Essential (primary) hypertension: Secondary | ICD-10-CM | POA: Diagnosis not present

## 2012-11-10 DIAGNOSIS — Z950 Presence of cardiac pacemaker: Secondary | ICD-10-CM | POA: Diagnosis not present

## 2012-11-10 DIAGNOSIS — E785 Hyperlipidemia, unspecified: Secondary | ICD-10-CM | POA: Diagnosis not present

## 2012-11-10 DIAGNOSIS — Z954 Presence of other heart-valve replacement: Secondary | ICD-10-CM | POA: Insufficient documentation

## 2012-11-10 DIAGNOSIS — Y831 Surgical operation with implant of artificial internal device as the cause of abnormal reaction of the patient, or of later complication, without mention of misadventure at the time of the procedure: Secondary | ICD-10-CM | POA: Insufficient documentation

## 2012-11-10 DIAGNOSIS — L98499 Non-pressure chronic ulcer of skin of other sites with unspecified severity: Secondary | ICD-10-CM

## 2012-11-10 DIAGNOSIS — E669 Obesity, unspecified: Secondary | ICD-10-CM | POA: Insufficient documentation

## 2012-11-10 DIAGNOSIS — I739 Peripheral vascular disease, unspecified: Secondary | ICD-10-CM | POA: Insufficient documentation

## 2012-11-10 DIAGNOSIS — Z7901 Long term (current) use of anticoagulants: Secondary | ICD-10-CM | POA: Diagnosis not present

## 2012-11-10 DIAGNOSIS — E119 Type 2 diabetes mellitus without complications: Secondary | ICD-10-CM | POA: Diagnosis not present

## 2012-11-10 DIAGNOSIS — I4891 Unspecified atrial fibrillation: Secondary | ICD-10-CM | POA: Insufficient documentation

## 2012-11-10 DIAGNOSIS — Z87891 Personal history of nicotine dependence: Secondary | ICD-10-CM | POA: Insufficient documentation

## 2012-11-10 HISTORY — PX: ABDOMINAL AORTAGRAM: SHX5454

## 2012-11-10 LAB — GLUCOSE, CAPILLARY: Glucose-Capillary: 140 mg/dL — ABNORMAL HIGH (ref 70–99)

## 2012-11-10 LAB — POCT I-STAT, CHEM 8
BUN: 30 mg/dL — ABNORMAL HIGH (ref 6–23)
Chloride: 100 mEq/L (ref 96–112)
HCT: 45 % (ref 39.0–52.0)
Potassium: 3.9 mEq/L (ref 3.5–5.1)
Sodium: 138 mEq/L (ref 135–145)

## 2012-11-10 LAB — PROTIME-INR
INR: 1.13 (ref 0.00–1.49)
Prothrombin Time: 14.3 seconds (ref 11.6–15.2)

## 2012-11-10 SURGERY — ABDOMINAL AORTAGRAM
Anesthesia: LOCAL | Laterality: Right

## 2012-11-10 MED ORDER — MIDAZOLAM HCL 2 MG/2ML IJ SOLN
INTRAMUSCULAR | Status: AC
  Filled 2012-11-10: qty 2

## 2012-11-10 MED ORDER — SODIUM CHLORIDE 0.9 % IV SOLN: INTRAVENOUS | Status: DC

## 2012-11-10 MED ORDER — LABETALOL HCL 5 MG/ML IV SOLN: 10.0000 mg | INTRAVENOUS | Status: DC | PRN

## 2012-11-10 MED ORDER — SODIUM CHLORIDE 0.9 % IV SOLN: 1.0000 mL/kg/h | INTRAVENOUS | Status: DC

## 2012-11-10 MED ORDER — GUAIFENESIN-DM 100-10 MG/5ML PO SYRP: 15.0000 mL | ORAL_SOLUTION | ORAL | Status: DC | PRN

## 2012-11-10 MED ORDER — LIDOCAINE HCL (PF) 1 % IJ SOLN
INTRAMUSCULAR | Status: AC
  Filled 2012-11-10: qty 30

## 2012-11-10 MED ORDER — OXYCODONE-ACETAMINOPHEN 5-325 MG PO TABS: 1.0000 | ORAL_TABLET | ORAL | Status: DC | PRN

## 2012-11-10 MED ORDER — ACETAMINOPHEN 325 MG RE SUPP: 325.0000 mg | RECTAL | Status: DC | PRN

## 2012-11-10 MED ORDER — MORPHINE SULFATE 10 MG/ML IJ SOLN: 2.0000 mg | INTRAMUSCULAR | Status: DC | PRN

## 2012-11-10 MED ORDER — ACETAMINOPHEN 325 MG PO TABS: 325.0000 mg | ORAL_TABLET | ORAL | Status: DC | PRN

## 2012-11-10 MED ORDER — HEPARIN SODIUM (PORCINE) 1000 UNIT/ML IJ SOLN
INTRAMUSCULAR | Status: AC
  Filled 2012-11-10: qty 1

## 2012-11-10 MED ORDER — ALUM & MAG HYDROXIDE-SIMETH 200-200-20 MG/5ML PO SUSP: 15.0000 mL | ORAL | Status: DC | PRN

## 2012-11-10 MED ORDER — ONDANSETRON HCL 4 MG/2ML IJ SOLN: 4.0000 mg | Freq: Four times a day (QID) | INTRAMUSCULAR | Status: DC | PRN

## 2012-11-10 MED ORDER — PHENOL 1.4 % MT LIQD: 1.0000 | OROMUCOSAL | Status: DC | PRN

## 2012-11-10 MED ORDER — FENTANYL CITRATE 0.05 MG/ML IJ SOLN
INTRAMUSCULAR | Status: AC
  Filled 2012-11-10: qty 2

## 2012-11-10 MED ORDER — HYDRALAZINE HCL 20 MG/ML IJ SOLN: 10.0000 mg | INTRAMUSCULAR | Status: DC | PRN

## 2012-11-10 NOTE — Interval H&P Note (Signed)
History and Physical Interval Note:  11/10/2012 8:57 AM  [name].  has presented today for surgery, with the diagnosis of PVD  The various methods of treatment have been discussed with the patient and family. After consideration of risks, benefits and other options for treatment, the patient has consented to  Procedure(s): ABDOMINAL AORTAGRAM (N/A) as a surgical intervention .  The patient's history has been reviewed, patient examined, no change in status, stable for surgery.  I have reviewed the patient's chart and labs.  Questions were answered to the patient's satisfaction.     BRABHAM IV, V. WELLS

## 2012-11-10 NOTE — H&P (View-Only) (Signed)
[facility] [place]   Patient name: [name] MRN: [id number] DOB: 02/23/1935 Sex: male   Referred by: Dr. Robson  Reason for referral:  Chief Complaint  Patient presents with  . PVD    C/O Left foot tingling, duration 4-6 mo.    HISTORY OF PRESENT ILLNESS: The patient comes in today with a new problem, a right heel ulcer x2. The patient is well known to me, having undergone left superficial femoral artery stenting and subintimal recanalization of an occluded left anterior tibial artery with balloon angioplasty in 2011. This was done in the setting of an ischemic left toe which ultimately required amputation. He now states that he has had a right heel ulcer for the past month and a half. He has undergone debridement at the wound center. A recent CT scan shows no evidence of osteomyelitis or soft tissue abscess. He is performing daily wound care. The patient also states that his left great toe amputation site is starting to breakdown. This is been present for about one month. He is not having fevers or chills. He does not have to perform wound care on the left great toe.  The patient is a brittle diabetic. He states that his blood sugars are in the 200 range. He also suffers from coronary artery disease having undergone coronary artery bypass grafting as well as aortic valve replacement. He is currently on Coumadin for his aortic valve. He is medically managed for his hypertension. He is on an ACE inhibitor. He states that his blood pressures are in the low 100 range systolic. He carries a diagnosis of hyperlipidemia, but is not on a statin currently. He is a former smoker, he quit in 1990  Past Medical History  Diagnosis Date  . Erectile dysfunction   . Obesity   . OSA (obstructive sleep apnea)     not using CPAP  . History of nephrolithiasis   . HTN (hypertension)   . Aortal stenosis 2006    biophrostetic  . Atrial fibrillation     onset after CABG---  [facility]  . S/P aortic valve replacement   . CHF (congestive heart failure)     abter CABG  . Pacemaker 2/07    VVI  . PVD (peripheral vascular disease)     CT angio showed- vascular insuff, intestine and RAS bilaterally, andgiogram 02/2009 severe PVD medical management  . Skin cancer     surgery (nose) 09-2010  . Sick sinus syndrome     [name]  . Hyperlipidemia   . Arthritis   . Gangrene   . Coronary artery disease   . DM type 2 (diabetes mellitus, type 2)     used to see Dr Doerr, now f/u by Dr Paz    Past Surgical History  Procedure Laterality Date  . Aortic valve replacement      biophrostetic for Ao Stenosis 2006  . Coronary artery bypass graft  2006  . Tonsillectomy    . Cataract extraction, bilateral    . Amputation  01/2010    great toe  . Pacemaker placement  07/2005    Medtronic Sigma SR implanted by Dr Brodie  . Aortic valve replacement  01/17/2011    S/P redo median sternotomy, extracorporeal circulation, redo Aortic Valve Replacement using a 23-mm Edwards pericardial Magna-Ease valve, Dr Byran Bartle  . Angioplasty / stenting femoral  02/10/10    Left femoral  . Incise and drain abcess Right May   2014    right heel     History   Social History  . Marital Status: Married    Spouse Name: N/A    Number of Children: 2  . Years of Education: N/A   Occupational History  . retired- long distance truck driver     Social History Main Topics  . Smoking status: Former Smoker    Types: Cigarettes    Quit date: 06/23/1988  . Smokeless tobacco: Former User  . Alcohol Use: No  . Drug Use: No  . Sexually Active: Not Currently   Other Topics Concern  . Not on file   Social History Narrative   Diet: healthy most of the time--   2 step children                Family History  Problem Relation Age of Onset  . Heart attack Father 53  . Stroke Mother 59  . Heart disease Sister 63     also stomach aneurysm  .  Cancer Neg Hx     colon, prostate  . Diabetes Sister     2 sister w/ DM  . Colon cancer Neg Hx   . Prostate cancer Neg Hx     Allergies as of 11/01/2012 - Review Complete 11/01/2012  Allergen Reaction Noted  . Metoprolol succinate  12/23/2006  . Potassium-containing compounds  09/26/2010    Current Outpatient Prescriptions on File Prior to Visit  Medication Sig Dispense Refill  . aspirin EC 81 MG tablet Take 81 mg by mouth daily.      . ezetimibe-simvastatin (VYTORIN) 10-40 MG per tablet Take 1 tablet by mouth at bedtime.  30 tablet  3  . furosemide (LASIX) 80 MG tablet Take 160mg everyday PLUS an extra 80mg every other day at 4PM  120 tablet  3  . insulin glargine (LANTUS) 100 UNIT/ML injection Inject 80 Units into the skin at bedtime.       . lisinopril (PRINIVIL,ZESTRIL) 5 MG tablet take 1 tablet by mouth once daily for high blood pressure  30 tablet  9  . potassium chloride SA (K-DUR,KLOR-CON) 20 MEQ tablet Take 1 tablet (20 mEq total) by mouth 2 (two) times daily.  60 tablet  1  . spironolactone (ALDACTONE) 25 MG tablet take 1 tablet by mouth once daily  90 tablet  3  . warfarin (COUMADIN) 5 MG tablet       . cephALEXin (KEFLEX) 500 MG capsule Take 1 capsule (500 mg total) by mouth 4 (four) times daily.  40 capsule  0   No current facility-administered medications on file prior to visit.     REVIEW OF SYSTEMS: Cardiovascular: No chest pain, chest pressure, palpitations, orthopnea, or dyspnea on exertion. No claudication or rest pain,  No history of DVT or phlebitis. Pulmonary: No productive cough, asthma or wheezing. Neurologic: No weakness, paresthesias, aphasia, or amaurosis. No dizziness. Hematologic: On Coumadin Musculoskeletal: No joint pain or joint swelling. Gastrointestinal: No blood in stool or hematemesis Genitourinary: No dysuria or hematuria. Psychiatric:: No history of major depression. Integumentary: Right heel ulcer, breakdown of left great toe  amputation Constitutional: No fever or chills.  PHYSICAL EXAMINATION: General: The patient appears their stated age.  Vital signs are BP 104/63  Pulse 64  Resp 16  Ht 5' 8" (1.727 m)  Wt 221 lb (100.245 kg)  BMI 33.61 kg/m2  SpO2 91% HEENT:  No gross abnormalities Pulmonary: Respirations are non-labored Musculoskeletal: There are no major deformities.   Neurologic:   No focal weakness or paresthesias are detected, Skin: There are no ulcer or rashes noted. 3 mm ulcer x2 on the right heel. Eschar at the distal extent of the left great toe amputation without erythema Psychiatric: The patient has normal affect. Cardiovascular: There is a regular rate and rhythm without significant murmur appreciated. Pedal pulses are not palpable  Diagnostic Studies: Toe pressure on the right is 52. He cannot be calculated on the left to do to the absence of the great toe. The left superficial femoral artery stents are occluded   Medication Changes: Coumadin will need to be held for 5 days prior to his angiogram. This will be discussed with his cardiologist. He will need a Lovenox bridge  Assessment:  Right heel ulcer Breakdown of left great toe amputation Plan: I discussed with the patient that this is a limb threatening problem. I have recommended being very aggressive in trying to improve his blood flow to both lower extremities to help avoid amputation. He has previously had stenting of his left superficial femoral artery. The stent has now occluded. I do not feel that he will have percutaneous options on the left leg for that reason I have recommended abdominal aortogram with bilateral runoff. I will plan on accessing the left groin. I will intervene on the right lower extremity if he has a lesion amenable to percutaneous intervention. I will get diagnostic images of the left leg so that we can plan surgical revascularization. This is been scheduled for Wednesday, May 21     V. Wells Christorpher Hisaw IV,  M.D. [facility] [place] Office: [phone] Pager:  [phone]   

## 2012-11-10 NOTE — Op Note (Signed)
[facility] [place]  Patient name: [name]. MRN: [id number] DOB: 1934-08-24 Sex: male  11/10/2012 Pre-operative Diagnosis: Bilateral ulcers Post-operative diagnosis:  Same Surgeon:  [name] Procedure Performed:  1.  ultrasound-guided access, left femoral artery  2.  abdominal aortogram  3.  bilateral lower extremity runoff  4.  additional order catheterization (right anterior tibial arteries)  5.  angioplasty, right anterior tibial artery  6.  followup x1    Indications:  The patient has a history of stenting of his left superficial femoral artery and recanalization of his left anterior tibial artery in the setting of an ulcer on his great toe which required amputation. He is having breakdown of his toe amputation site as well as a new ulcer on his right heel. Duplex indicated an occluded left superficial femoral artery stent. He comes in today for angiography and possible intervention of his right leg  Procedure:  The patient was identified in the holding area and taken to room 8.  The patient was then placed supine on the table and prepped and draped in the usual sterile fashion.  A time out was called.  Ultrasound was used to evaluate the left common femoral artery.  It was patent .  A digital ultrasound image was acquired.  A micropuncture needle was used to access the left common femoral artery under ultrasound guidance.  An 018 wire was advanced without resistance and a micropuncture sheath was placed.  The 018 wire was removed and a benson wire was placed.  The micropuncture sheath was exchanged for a 5 french sheath.  An omniflush catheter was advanced over the wire to the level of L-1.  An abdominal angiogram was obtained.  Next, using the omniflush catheter and a benson wire, the aortic bifurcation was crossed and the catheter was placed into theright external iliac artery and right runoff was obtained.  left runoff was performed via  retrograde sheath injections. The aortic bifurcation was very difficult to cross. I was ultimately able to do so using a Omni flush catheter, a glide wire, followed by a straight catheter  Findings:   Aortogram:  The visualized portions of the suprarenal abdominal aorta showed no significant stenosis. There are single renal arteries bilaterally which are widely patent. The infrarenal abdominal aorta is widely patent. Bilateral common and external iliac arteries are widely patent.  Right Lower Extremity:  The right common femoral artery is patent throughout its course as is the profunda femoral artery. The right superficial femoral artery is calcified but patent down to the patella. There is early takeoff of the anterior tibial artery at the joint space. The tibioperoneal trunk occludes just below the joint space. There is recanalization and reconstitution of the anterior tibial artery which is the only runoff onto the foot  Left Lower Extremity:  The left common femoral profunda femoral artery are patent throughout it's course. It is a certain as to whether or not the stent within the left superficial femoral artery is occluded or has severe high-grade stenosis throughout with increased plaque burden. The popliteal artery is patent throughout it's course. The peroneal artery is the dominant runoff to the ankle. The previously treated anterior tibial artery has a short segment occlusion.  Intervention:  The decision was made to proceed with intervention of the right leg. Over an Amplatz superstiff wire a 5 French 45 cm antral 1 sheath was advanced into the right common femoral artery. The patient was fully heparinized. I  used a treasure 12 wire with the support of an 018 quick cross catheter to gain access into the anterior tibial artery. Recanalization was performed down to the distal. I used a 018 run through wire to cross the lesion. Identified to advance a 3 x 20 Fox SV balloon however it would not go  through the occluded segment of the artery. I then selected a 2.5 x 40 cm Fox SV balloon and perform balloon angioplasty of the occluded segment. Each inflation was taken to 14 atmospheres and held for 1 minute. A followup arteriogram revealed in line flow to the ankle however there was a non-flow-limiting dissection. I elected to replace the 3 x 20 Fox SV balloon and perform balloon angioplasty again of the treated segment. This time the balloon was held for 3 minutes at nominal pressure. A followup arteriogram revealed improved appearance of a non-flow-limiting dissection. At this point I elected not to pursue this any further as I felt the next that would be placing a stent and I did not want to do that at this time. At this point, catheters and wires were removed. The patient taken the holding area for sheath pull once coagulation profile corrects.  Impression:  #1  successful recanalization of an occluded right anterior tibial artery using a 3 mm balloon  #2  high-grade stenosis versus occlusion of the left superficial femoral artery with high-grade stenosis versus occlusion of the previously treated anterior tibial artery segment. The peroneal artery remains patent down to the ankle  #3  the patient we brought back for percutaneous attempt at revascularization of the left leg    V. Durene Cal, M.D. [facility] Office: [phone] Pager:  [phone]

## 2012-11-11 ENCOUNTER — Other Ambulatory Visit: Payer: Self-pay | Admitting: *Deleted

## 2012-11-11 DIAGNOSIS — B9689 Other specified bacterial agents as the cause of diseases classified elsewhere: Secondary | ICD-10-CM | POA: Diagnosis not present

## 2012-11-11 DIAGNOSIS — L97509 Non-pressure chronic ulcer of other part of unspecified foot with unspecified severity: Secondary | ICD-10-CM | POA: Diagnosis not present

## 2012-11-11 DIAGNOSIS — E1169 Type 2 diabetes mellitus with other specified complication: Secondary | ICD-10-CM | POA: Diagnosis not present

## 2012-11-12 ENCOUNTER — Telehealth: Payer: Self-pay | Admitting: Internal Medicine

## 2012-11-12 ENCOUNTER — Encounter (HOSPITAL_COMMUNITY): Payer: Self-pay | Admitting: Pharmacy Technician

## 2012-11-12 ENCOUNTER — Telehealth: Payer: Self-pay | Admitting: *Deleted

## 2012-11-12 NOTE — Telephone Encounter (Signed)
Per Pt wife Pt has been out of med x64month but was recently in the hospital for a day so he might have gotten 1 dose then. Per Dr Drue Novel Pt does not need to restart med. Pt aware. Spoke with Pt and wife as per Pt request.

## 2012-11-12 NOTE — Telephone Encounter (Signed)
Patient's wife is calling to inquire if Dr. Drue Novel thinks the patient should continue taking his Klor-Con medication. Patient is currently not taking the medication because he has not had the money to pick up his medication but he is having surgery on his leg to remove an artery on 11/23/2012 and needs to know if he should start taking the Klor-Con.

## 2012-11-12 NOTE — Telephone Encounter (Signed)
Patient has clinic appt 11/16/2012, we will bridge him again for procedure on 6/3, lovenox 100mg  bid For left lower extremity angiogram  5/28 last dose of coumadin 5/29 no coumadin or Lovenox 5/30 Lovenox 100 mg 9 am and 9 pm 5/31 Lovenox 100 mg 9 am and 9 pm 6/1  Lovenox 100 mg 9 am and 9 pm 6/2  Lovenox 100 mg 9 am only 6/3  day of procedure no Lovenox or coumadin prior to procedure  When MD states you can resume coumadin take coumadin and Lovenox both For 1st 2 days take an extra 1/2 tablet of coumadin to facilitate INR to range

## 2012-11-12 NOTE — Telephone Encounter (Signed)
Message copied by Carmela Hurt on Fri Nov 12, 2012 11:25 AM ------      Message from: Regis Bill B      Created: Thu Nov 11, 2012  2:14 PM         Will forward to coumadin clinic      ----- Message -----         From: Laurey Morale, MD         Sent: 11/11/2012   1:47 PM           To: Mariane Masters, RPH, Melene Plan, RN, #            We will arrange bridging Lovenox while off coumadin (history of LV thrombus)      ----- Message -----         From: Melene Plan, RN         Sent: 11/10/2012   1:30 PM           To: Laurey Morale, MD, Conley Simmonds Pullins, RN, #            Please see note below from Dr Myra Gianotti that he wants to schedule him again on June 4 for intervention on the left leg. Would you please inform the patient on what he needs to do about his Coumadin start/stop for 5 days and bridging of Lovanox.       Thanks      Jacquelyne Balint            ----- Message -----         From: Nada Libman, MD         Sent: 11/10/2012  11:01 AM           To: Reuel Derby, Melene Plan, RN            11/10/2012:            Post-operative diagnosis:  Same      Surgeon:  [name]      Procedure Performed:       1.  ultrasound-guided access, left femoral artery       2.  abdominal aortogram       3.  bilateral lower extremity runoff       4.  additional order catheterization (right anterior tibial arteries)       5.  angioplasty, right anterior tibial artery       6.  followup x1                  The patient will need to be scheduled for angiography and intervention on the left leg in 2 weeks. Please have his medical doctors address how to bridge him or restart his Coumadin                   ------

## 2012-11-12 NOTE — Telephone Encounter (Signed)
On 11/10/2012, his potassium was normal. If he has not been taking potassium in the last 2 weeks he does not need it. Otherwise he needs to restart.

## 2012-11-16 ENCOUNTER — Ambulatory Visit (INDEPENDENT_AMBULATORY_CARE_PROVIDER_SITE_OTHER): Payer: Medicare Other | Admitting: Pharmacist

## 2012-11-16 DIAGNOSIS — I359 Nonrheumatic aortic valve disorder, unspecified: Secondary | ICD-10-CM

## 2012-11-16 DIAGNOSIS — Z7901 Long term (current) use of anticoagulants: Secondary | ICD-10-CM

## 2012-11-16 NOTE — Telephone Encounter (Signed)
New Prob       Pt is wanting to speak to coumadin clinic in regards to instructions for COUMADIN and LOVENOX. Please call.

## 2012-11-16 NOTE — Telephone Encounter (Signed)
Pt had appt today.  Discussed Lovenox instructions.  Copy given to patient and wife.

## 2012-11-16 NOTE — Patient Instructions (Addendum)
5/27- Take 1 tablet of Coumadin 5/28- Take 1 1/2 tablets of Coumadin 5/29- Take 1 1/2 tablets of Coumadin  5/30- No Coumadin or Lovenox  5/31- Lovenox 100 mg 9 am and 9 pm  6/1- Lovenox 100 mg 9 am and 9 pm  6/2- Lovenox 100 mg 9 am only  6/3- Day of procedure no Lovenox or coumadin prior to procedure.  When okay with Dr. Myra Gianotti, restart Coumadin AND Lovenox.  Take an extra 1/2 tablet of Coumadin x 2 days then resume previous dose.  Continue Lovenox 100mg  in AM and PM 6/9- Recheck INR

## 2012-11-17 ENCOUNTER — Telehealth: Payer: Self-pay | Admitting: Internal Medicine

## 2012-11-17 NOTE — Telephone Encounter (Signed)
Spoke with pt's wife.  Answered questions regarding Lovenox bridge.

## 2012-11-17 NOTE — Telephone Encounter (Signed)
New Prob     Has some questions regarding instructions for blood thinner prior to surgery. Please call.

## 2012-11-23 ENCOUNTER — Encounter (HOSPITAL_COMMUNITY)
Admission: RE | Disposition: A | Discharge status: Home / Self Care | Payer: Self-pay | Source: Ambulatory Visit | Attending: Surgery

## 2012-11-23 ENCOUNTER — Telehealth: Payer: Self-pay | Admitting: Cardiology

## 2012-11-23 ENCOUNTER — Other Ambulatory Visit: Payer: Self-pay | Admitting: *Deleted

## 2012-11-23 ENCOUNTER — Ambulatory Visit (HOSPITAL_COMMUNITY)
Admission: RE | Admit: 2012-11-23 | Discharge: 2012-11-23 | Disposition: A | Discharge status: Home / Self Care | Payer: Medicare Other | Source: Ambulatory Visit | Attending: Surgery | Admitting: Surgery

## 2012-11-23 DIAGNOSIS — L97409 Non-pressure chronic ulcer of unspecified heel and midfoot with unspecified severity: Secondary | ICD-10-CM | POA: Insufficient documentation

## 2012-11-23 DIAGNOSIS — G4733 Obstructive sleep apnea (adult) (pediatric): Secondary | ICD-10-CM | POA: Insufficient documentation

## 2012-11-23 DIAGNOSIS — Z6833 Body mass index (BMI) 33.0-33.9, adult: Secondary | ICD-10-CM | POA: Diagnosis not present

## 2012-11-23 DIAGNOSIS — E785 Hyperlipidemia, unspecified: Secondary | ICD-10-CM | POA: Insufficient documentation

## 2012-11-23 DIAGNOSIS — I509 Heart failure, unspecified: Secondary | ICD-10-CM | POA: Insufficient documentation

## 2012-11-23 DIAGNOSIS — E119 Type 2 diabetes mellitus without complications: Secondary | ICD-10-CM | POA: Diagnosis not present

## 2012-11-23 DIAGNOSIS — I739 Peripheral vascular disease, unspecified: Secondary | ICD-10-CM

## 2012-11-23 DIAGNOSIS — I4891 Unspecified atrial fibrillation: Secondary | ICD-10-CM | POA: Insufficient documentation

## 2012-11-23 DIAGNOSIS — Z7982 Long term (current) use of aspirin: Secondary | ICD-10-CM | POA: Diagnosis not present

## 2012-11-23 DIAGNOSIS — N529 Male erectile dysfunction, unspecified: Secondary | ICD-10-CM | POA: Diagnosis not present

## 2012-11-23 DIAGNOSIS — I7092 Chronic total occlusion of artery of the extremities: Secondary | ICD-10-CM | POA: Insufficient documentation

## 2012-11-23 DIAGNOSIS — Z794 Long term (current) use of insulin: Secondary | ICD-10-CM | POA: Diagnosis not present

## 2012-11-23 DIAGNOSIS — I2510 Atherosclerotic heart disease of native coronary artery without angina pectoris: Secondary | ICD-10-CM | POA: Insufficient documentation

## 2012-11-23 DIAGNOSIS — I10 Essential (primary) hypertension: Secondary | ICD-10-CM | POA: Insufficient documentation

## 2012-11-23 DIAGNOSIS — I495 Sick sinus syndrome: Secondary | ICD-10-CM | POA: Insufficient documentation

## 2012-11-23 DIAGNOSIS — Z888 Allergy status to other drugs, medicaments and biological substances status: Secondary | ICD-10-CM | POA: Diagnosis not present

## 2012-11-23 DIAGNOSIS — S98119A Complete traumatic amputation of unspecified great toe, initial encounter: Secondary | ICD-10-CM | POA: Insufficient documentation

## 2012-11-23 DIAGNOSIS — E669 Obesity, unspecified: Secondary | ICD-10-CM | POA: Diagnosis not present

## 2012-11-23 DIAGNOSIS — Z7901 Long term (current) use of anticoagulants: Secondary | ICD-10-CM | POA: Insufficient documentation

## 2012-11-23 DIAGNOSIS — L98499 Non-pressure chronic ulcer of skin of other sites with unspecified severity: Secondary | ICD-10-CM | POA: Diagnosis not present

## 2012-11-23 DIAGNOSIS — Z950 Presence of cardiac pacemaker: Secondary | ICD-10-CM | POA: Insufficient documentation

## 2012-11-23 DIAGNOSIS — Z954 Presence of other heart-valve replacement: Secondary | ICD-10-CM | POA: Insufficient documentation

## 2012-11-23 DIAGNOSIS — Z48812 Encounter for surgical aftercare following surgery on the circulatory system: Secondary | ICD-10-CM

## 2012-11-23 DIAGNOSIS — Z79899 Other long term (current) drug therapy: Secondary | ICD-10-CM | POA: Insufficient documentation

## 2012-11-23 DIAGNOSIS — Z87891 Personal history of nicotine dependence: Secondary | ICD-10-CM | POA: Insufficient documentation

## 2012-11-23 DIAGNOSIS — Z951 Presence of aortocoronary bypass graft: Secondary | ICD-10-CM | POA: Diagnosis not present

## 2012-11-23 HISTORY — PX: LOWER EXTREMITY ANGIOGRAM: SHX5508

## 2012-11-23 LAB — POCT ACTIVATED CLOTTING TIME
Activated Clotting Time: 230 seconds
Activated Clotting Time: 176 seconds

## 2012-11-23 LAB — POCT I-STAT, CHEM 8
Calcium, Ion: 1.25 mmol/L (ref 1.13–1.30)
Chloride: 100 mEq/L (ref 96–112)
HCT: 40 % (ref 39.0–52.0)
Hemoglobin: 13.6 g/dL (ref 13.0–17.0)
Potassium: 4.6 mEq/L (ref 3.5–5.1)

## 2012-11-23 LAB — PROTIME-INR
INR: 1.25 (ref 0.00–1.49)
Prothrombin Time: 15.5 seconds — ABNORMAL HIGH (ref 11.6–15.2)

## 2012-11-23 SURGERY — ANGIOGRAM, LOWER EXTREMITY
Anesthesia: LOCAL | Laterality: Left

## 2012-11-23 MED ORDER — HEPARIN (PORCINE) IN NACL 2-0.9 UNIT/ML-% IJ SOLN
INTRAMUSCULAR | Status: AC
  Filled 2012-11-23: qty 1000

## 2012-11-23 MED ORDER — ONDANSETRON HCL 4 MG/2ML IJ SOLN: 4.0000 mg | Freq: Four times a day (QID) | INTRAMUSCULAR | Status: DC | PRN

## 2012-11-23 MED ORDER — PHENOL 1.4 % MT LIQD: 1.0000 | OROMUCOSAL | Status: DC | PRN

## 2012-11-23 MED ORDER — ACETAMINOPHEN 325 MG RE SUPP: 325.0000 mg | RECTAL | Status: DC | PRN

## 2012-11-23 MED ORDER — SODIUM CHLORIDE 0.9 % IV SOLN: 1.0000 mL/kg/h | INTRAVENOUS | Status: DC

## 2012-11-23 MED ORDER — FENTANYL CITRATE 0.05 MG/ML IJ SOLN
INTRAMUSCULAR | Status: AC
  Filled 2012-11-23: qty 2

## 2012-11-23 MED ORDER — ALUM & MAG HYDROXIDE-SIMETH 200-200-20 MG/5ML PO SUSP: 15.0000 mL | ORAL | Status: DC | PRN

## 2012-11-23 MED ORDER — LABETALOL HCL 5 MG/ML IV SOLN: 10.0000 mg | INTRAVENOUS | Status: DC | PRN

## 2012-11-23 MED ORDER — OXYCODONE-ACETAMINOPHEN 5-325 MG PO TABS: 1.0000 | ORAL_TABLET | ORAL | Status: DC | PRN

## 2012-11-23 MED ORDER — GUAIFENESIN-DM 100-10 MG/5ML PO SYRP: 15.0000 mL | ORAL_SOLUTION | ORAL | Status: DC | PRN

## 2012-11-23 MED ORDER — MIDAZOLAM HCL 2 MG/2ML IJ SOLN
INTRAMUSCULAR | Status: AC
  Filled 2012-11-23: qty 2

## 2012-11-23 MED ORDER — SODIUM CHLORIDE 0.9 % IV SOLN
INTRAVENOUS | Status: DC
  Administered 2012-11-23: 09:00:00 via INTRAVENOUS

## 2012-11-23 MED ORDER — ACETAMINOPHEN 325 MG PO TABS: 325.0000 mg | ORAL_TABLET | ORAL | Status: DC | PRN

## 2012-11-23 MED ORDER — HYDRALAZINE HCL 20 MG/ML IJ SOLN: 10.0000 mg | INTRAMUSCULAR | Status: DC | PRN

## 2012-11-23 MED ORDER — LIDOCAINE HCL (PF) 1 % IJ SOLN
INTRAMUSCULAR | Status: AC
  Filled 2012-11-23: qty 30

## 2012-11-23 NOTE — Interval H&P Note (Signed)
History and Physical Interval Note:  11/23/2012 10:31 AM  [name].  has presented today for surgery, with the diagnosis of PVD  The various methods of treatment have been discussed with the patient and family. After consideration of risks, benefits and other options for treatment, the patient has consented to  Procedure(s): LOWER EXTREMITY ANGIOGRAM (Left) as a surgical intervention .  The patient's history has been reviewed, patient examined, no change in status, stable for surgery.  I have reviewed the patient's chart and labs.  Questions were answered to the patient's satisfaction.     BRABHAM IV, VAnner Crete  The patient has recently undergone intervention on his right leg. He is brought back today to evaluate and possibly treat his left leg

## 2012-11-23 NOTE — Telephone Encounter (Signed)
Spoke with pt's wife.  She is concerned pt does not have enough Lovenox left at home.  He should have 9 more injections left which would be enough to last until Monday.  She will check at home and let us know.

## 2012-11-23 NOTE — H&P (View-Only) (Signed)
[facility] [place]   Patient name: [name] MRN: [id number] DOB: [birth year] Sex: male   Referred by: Dr. Leanord Hawking  Reason for referral:  Chief Complaint  Patient presents with  . PVD    C/O Left foot tingling, duration 4-6 mo.    HISTORY OF PRESENT ILLNESS: The patient comes in today with a new problem, a right heel ulcer x2. The patient is well known to me, having undergone left superficial femoral artery stenting and subintimal recanalization of an occluded left anterior tibial artery with balloon angioplasty in 2011. This was done in the setting of an ischemic left toe which ultimately required amputation. He now states that he has had a right heel ulcer for the past month and a half. He has undergone debridement at the wound center. A recent CT scan shows no evidence of osteomyelitis or soft tissue abscess. He is performing daily wound care. The patient also states that his left great toe amputation site is starting to breakdown. This is been present for about one month. He is not having fevers or chills. He does not have to perform wound care on the left great toe.  The patient is a brittle diabetic. He states that his blood sugars are in the 200 range. He also suffers from coronary artery disease having undergone coronary artery bypass grafting as well as aortic valve replacement. He is currently on Coumadin for his aortic valve. He is medically managed for his hypertension. He is on an ACE inhibitor. He states that his blood pressures are in the low 100 range systolic. He carries a diagnosis of hyperlipidemia, but is not on a statin currently. He is a former smoker, he quit in 1990  Past Medical History  Diagnosis Date  . Erectile dysfunction   . Obesity   . OSA (obstructive sleep apnea)     not using CPAP  . History of nephrolithiasis   . HTN (hypertension)   . Aortal stenosis 2006    biophrostetic  . Atrial fibrillation     onset after CABG---  coumadin management at Coumadin clinic  . S/P aortic valve replacement   . CHF (congestive heart failure)     abter CABG  . Pacemaker 2/07    VVI  . PVD (peripheral vascular disease)     CT angio showed- vascular insuff, intestine and RAS bilaterally, andgiogram 02/2009 severe PVD medical management  . Skin cancer     surgery (nose) 09-2010  . Sick sinus syndrome     [name]  . Hyperlipidemia   . Arthritis   . Gangrene   . Coronary artery disease   . DM type 2 (diabetes mellitus, type 2)     used to see Dr Roanna Raider, now f/u by Dr Drue Novel    Past Surgical History  Procedure Laterality Date  . Aortic valve replacement      biophrostetic for Ao Stenosis 2006  . Coronary artery bypass graft  2006  . Tonsillectomy    . Cataract extraction, bilateral    . Amputation  01/2010    great toe  . Pacemaker placement  07/2005    Medtronic Sigma SR implanted by Dr Juanda Chance  . Aortic valve replacement  01/17/2011    S/P redo median sternotomy, extracorporeal circulation, redo Aortic Valve Replacement using a 23-mm Edwards pericardial Magna-Ease valve, Dr Concepcion Living  . Angioplasty / stenting femoral  02/10/10    Left femoral  . Incise and drain abcess Right May  2014    right heel     History   Social History  . Marital Status: Married    Spouse Name: N/A    Number of Children: 2  . Years of Education: N/A   Occupational History  . 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 Smoking status: Former Smoker    Types: Cigarettes    Quit date: 06/23/1988  . Smokeless tobacco: Former Neurosurgeon  . Alcohol Use: No  . Drug Use: No  . Sexually Active: Not Currently   Other Topics Concern  . Not on file   Social History Narrative   Diet: healthy most of the time--   2 step children                Family History  Problem Relation Age of Onset  . Heart attack Father 76  . Stroke Mother 38  . Heart disease Sister 62     also stomach aneurysm  .  Cancer Neg Hx     colon, prostate  . Diabetes Sister     2 sister w/ DM  . Colon cancer Neg Hx   . Prostate cancer Neg Hx     Allergies as of 11/01/2012 - Review Complete 11/01/2012  Allergen Reaction Noted  . Metoprolol succinate  12/23/2006  . Potassium-containing compounds  09/26/2010    Current Outpatient Prescriptions on File Prior to Visit  Medication Sig Dispense Refill  . aspirin EC 81 MG tablet Take 81 mg by mouth daily.      Marland Kitchen ezetimibe-simvastatin (VYTORIN) 10-40 MG per tablet Take 1 tablet by mouth at bedtime.  30 tablet  3  . furosemide (LASIX) 80 MG tablet Take 160mg  everyday PLUS an extra 80mg  every other day at 4PM  120 tablet  3  . insulin glargine (LANTUS) 100 UNIT/ML injection Inject 80 Units into the skin at bedtime.       Marland Kitchen lisinopril (PRINIVIL,ZESTRIL) 5 MG tablet take 1 tablet by mouth once daily for high blood pressure  30 tablet  9  . potassium chloride SA (K-DUR,KLOR-CON) 20 MEQ tablet Take 1 tablet (20 mEq total) by mouth 2 (two) times daily.  60 tablet  1  . spironolactone (ALDACTONE) 25 MG tablet take 1 tablet by mouth once daily  90 tablet  3  . warfarin (COUMADIN) 5 MG tablet       . cephALEXin (KEFLEX) 500 MG capsule Take 1 capsule (500 mg total) by mouth 4 (four) times daily.  40 capsule  0   No current facility-administered medications on file prior to visit.     REVIEW OF SYSTEMS: Cardiovascular: No chest pain, chest pressure, palpitations, orthopnea, or dyspnea on exertion. No claudication or rest pain,  No history of DVT or phlebitis. Pulmonary: No productive cough, asthma or wheezing. Neurologic: No weakness, paresthesias, aphasia, or amaurosis. No dizziness. Hematologic: On Coumadin Musculoskeletal: No joint pain or joint swelling. Gastrointestinal: No blood in stool or hematemesis Genitourinary: No dysuria or hematuria. Psychiatric:: No history of major depression. Integumentary: Right heel ulcer, breakdown of left great toe  amputation Constitutional: No fever or chills.  PHYSICAL EXAMINATION: General: The patient appears their stated age.  Vital signs are BP 104/63  Pulse 64  Resp 16  Ht 5\' 8"  (1.727 m)  Wt 221 lb (100.245 kg)  BMI 33.61 kg/m2  SpO2 91% HEENT:  No gross abnormalities Pulmonary: Respirations are non-labored Musculoskeletal: There are no major deformities.   Neurologic:  No focal weakness or paresthesias are detected, Skin: There are no ulcer or rashes noted. 3 mm ulcer x2 on the right heel. Eschar at the distal extent of the left great toe amputation without erythema Psychiatric: The patient has normal affect. Cardiovascular: There is a regular rate and rhythm without significant murmur appreciated. Pedal pulses are not palpable  Diagnostic Studies: Toe pressure on the right is 52. He cannot be calculated on the left to do to the absence of the great toe. The left superficial femoral artery stents are occluded   Medication Changes: Coumadin will need to be held for 5 days prior to his angiogram. This will be discussed with his cardiologist. He will need a Lovenox bridge  Assessment:  Right heel ulcer Breakdown of left great toe amputation Plan: I discussed with the patient that this is a limb threatening problem. I have recommended being very aggressive in trying to improve his blood flow to both lower extremities to help avoid amputation. He has previously had stenting of his left superficial femoral artery. The stent has now occluded. I do not feel that he will have percutaneous options on the left leg for that reason I have recommended abdominal aortogram with bilateral runoff. I will plan on accessing the left groin. I will intervene on the right lower extremity if he has a lesion amenable to percutaneous intervention. I will get diagnostic images of the left leg so that we can plan surgical revascularization. This is been scheduled for Wednesday, May 21     V. Charlena Cross,  M.D. [facility] Office: [phone] Pager:  [phone]

## 2012-11-23 NOTE — Op Note (Signed)
[facility] [place]  Patient name: [name]. MRN: [id number] DOB: [birth year] Sex: male  11/23/2012 Pre-operative Diagnosis: left leg ulcer Post-operative diagnosis:  Same Surgeon:  [name] Procedure Performed:  1.  ultrasound-guided access, right femoral artery  2.  stent, left superficial femoral artery    Indications:  The patient recently underwent diagnostic images of the left leg and intervention on the right. He was found to have diffuse disease in the occlusion of the left superficial femoral artery. He has previously undergone a left great toe indication which has started to break down. He comes in for intervention today.  Procedure:  The patient was identified in the holding area and taken to room 8.  The patient was then placed supine on the table and prepped and draped in the usual sterile fashion.  A time out was called.  Ultrasound was used to evaluate the right common femoral artery.  It was patent .  A digital ultrasound image was acquired.  A micropuncture needle was used to access the right common femoral artery under ultrasound guidance.  An 018 wire was advanced without resistance and a micropuncture sheath was placed.  The 018 wire was removed and a benson wire was placed.  The micropuncture sheath was exchanged for a 5 french sheath.  Next, I used a Omni flush catheter and a Benson wire to get wire access into the left common femoral artery. I then inserted a 7 French 45 cm sheath and to the right common iliac artery. I then used a end hole catheter and placed a Amplatz superstiff wire. The dilator for the 7 French sheath was reinserted and the 7 French sheath was advanced over the bifurcation into the distal left external iliac artery. At this point the patient was fully heparinized. Using a 035 Glidewire and a quick cross catheter the lesion in the superficial femoral artery was crossed. A contrast injection was performed with the  catheter in the popliteal artery was confirmed successful crossing of the lesion. A 035 Woolley wire was placed. The lesion of concern in the mid superficial femoral artery was primarily stented using a Epic 7 x 1 20 self-expanding stent. It was molded to confirmation with a 6 mm balloon. Completion arteriogram revealed resolution of the stenosis. The patient had preserved single-vessel runoff via the peroneal artery. The anterior tibial artery is occluded at its midportion but is the dominant vessel across the ankle   Impression:  #1  successful stenting of a nearly occluded left superficial femoral artery using a 7 x 1 20 self-expanding stent  #2  in line flow to the ankle via the peroneal artery. There is a short segment occlusion of the anterior tibial artery which does reconstitute. The anterior tibial artery is the dominant vessel across the ankle    V. Durene Cal, M.D. [facility] [place] Office: [phone] Pager:  [phone]

## 2012-11-23 NOTE — Telephone Encounter (Signed)
New problem   pts wife calling for lovonox

## 2012-11-24 ENCOUNTER — Telehealth: Payer: Self-pay | Admitting: Surgery

## 2012-11-24 LAB — GLUCOSE, CAPILLARY
Glucose-Capillary: 102 mg/dL — ABNORMAL HIGH (ref 70–99)
Glucose-Capillary: 55 mg/dL — ABNORMAL LOW (ref 70–99)

## 2012-11-24 NOTE — Telephone Encounter (Signed)
Message copied by Fredrich Birks on Wed Nov 24, 2012  9:33 AM ------      Message from: Melene Plan      Created: Tue Nov 23, 2012  1:57 PM                   ----- Message -----         From: Nada Libman, MD         Sent: 11/23/2012  12:21 PM           To: Reuel Derby, Melene Plan, RN            11/23/2012:            Procedure Performed:       1.  ultrasound-guided access, right femoral artery       2.  stent, left superficial femoral artery            Schedule followup with me in 3 months with a duplex ultrasound of both lower extremities as well as ABI bilaterally ------

## 2012-11-24 NOTE — Telephone Encounter (Addendum)
Message copied by Fredrich Birks on Wed Nov 24, 2012  9:39 AM ------      Message from: Sharee Pimple      Created: Tue Nov 23, 2012  2:02 PM      Regarding: SCHEDULE                   ----- Message -----         From: Melene Plan, RN         Sent: 11/23/2012   1:57 PM           To: Sharee Pimple, CMA, [facility]                        ----- Message -----         From: Nada Libman, MD         Sent: 11/23/2012  12:21 PM           To: Reuel Derby, Melene Plan, RN            11/23/2012:            Procedure Performed:       1.  ultrasound-guided access, right femoral artery       2.  stent, left superficial femoral artery            Schedule followup with me in 3 months with a duplex ultrasound of both lower extremities as well as ABI bilaterally ------  Spoke with patient regarding date and time of follow up appointment, dpm

## 2012-11-24 NOTE — Telephone Encounter (Signed)
Spoke with pt's wife.  Stated they can only find 6 injections.  Have called company and left a message to see if we can get another shipment prior to the weekend.

## 2012-11-25 ENCOUNTER — Encounter (HOSPITAL_BASED_OUTPATIENT_CLINIC_OR_DEPARTMENT_OTHER): Payer: Medicare Other | Attending: Internal Medicine

## 2012-11-25 DIAGNOSIS — L97409 Non-pressure chronic ulcer of unspecified heel and midfoot with unspecified severity: Secondary | ICD-10-CM | POA: Diagnosis not present

## 2012-11-25 DIAGNOSIS — E1169 Type 2 diabetes mellitus with other specified complication: Secondary | ICD-10-CM | POA: Diagnosis not present

## 2012-11-26 ENCOUNTER — Encounter: Payer: Self-pay | Admitting: Internal Medicine

## 2012-11-26 DIAGNOSIS — I4891 Unspecified atrial fibrillation: Secondary | ICD-10-CM | POA: Diagnosis not present

## 2012-11-26 DIAGNOSIS — I495 Sick sinus syndrome: Secondary | ICD-10-CM

## 2012-11-26 DIAGNOSIS — Z950 Presence of cardiac pacemaker: Secondary | ICD-10-CM | POA: Diagnosis not present

## 2012-11-29 ENCOUNTER — Ambulatory Visit (INDEPENDENT_AMBULATORY_CARE_PROVIDER_SITE_OTHER): Payer: Medicare Other | Admitting: *Deleted

## 2012-11-29 DIAGNOSIS — Z7901 Long term (current) use of anticoagulants: Secondary | ICD-10-CM | POA: Diagnosis not present

## 2012-11-29 DIAGNOSIS — I359 Nonrheumatic aortic valve disorder, unspecified: Secondary | ICD-10-CM | POA: Diagnosis not present

## 2012-11-29 LAB — POCT INR: INR: 1.7

## 2012-11-29 NOTE — Telephone Encounter (Signed)
Pt came to clinic today to check INR.

## 2012-12-02 DIAGNOSIS — E1169 Type 2 diabetes mellitus with other specified complication: Secondary | ICD-10-CM | POA: Diagnosis not present

## 2012-12-02 DIAGNOSIS — L97409 Non-pressure chronic ulcer of unspecified heel and midfoot with unspecified severity: Secondary | ICD-10-CM | POA: Diagnosis not present

## 2012-12-16 ENCOUNTER — Other Ambulatory Visit: Payer: Self-pay | Admitting: Cardiology

## 2012-12-16 DIAGNOSIS — L97409 Non-pressure chronic ulcer of unspecified heel and midfoot with unspecified severity: Secondary | ICD-10-CM | POA: Diagnosis not present

## 2012-12-16 DIAGNOSIS — E1169 Type 2 diabetes mellitus with other specified complication: Secondary | ICD-10-CM | POA: Diagnosis not present

## 2012-12-17 ENCOUNTER — Ambulatory Visit (INDEPENDENT_AMBULATORY_CARE_PROVIDER_SITE_OTHER): Payer: Medicare Other | Admitting: *Deleted

## 2012-12-17 DIAGNOSIS — Z7901 Long term (current) use of anticoagulants: Secondary | ICD-10-CM | POA: Diagnosis not present

## 2012-12-17 DIAGNOSIS — I359 Nonrheumatic aortic valve disorder, unspecified: Secondary | ICD-10-CM

## 2012-12-17 LAB — POCT INR: INR: 1.8

## 2012-12-23 ENCOUNTER — Encounter (HOSPITAL_BASED_OUTPATIENT_CLINIC_OR_DEPARTMENT_OTHER): Payer: Medicare Other | Attending: Internal Medicine

## 2012-12-23 DIAGNOSIS — L97409 Non-pressure chronic ulcer of unspecified heel and midfoot with unspecified severity: Secondary | ICD-10-CM | POA: Diagnosis not present

## 2012-12-23 DIAGNOSIS — E1169 Type 2 diabetes mellitus with other specified complication: Secondary | ICD-10-CM | POA: Insufficient documentation

## 2012-12-23 DIAGNOSIS — A4901 Methicillin susceptible Staphylococcus aureus infection, unspecified site: Secondary | ICD-10-CM | POA: Insufficient documentation

## 2012-12-23 DIAGNOSIS — L97509 Non-pressure chronic ulcer of other part of unspecified foot with unspecified severity: Secondary | ICD-10-CM | POA: Insufficient documentation

## 2012-12-30 ENCOUNTER — Other Ambulatory Visit (HOSPITAL_BASED_OUTPATIENT_CLINIC_OR_DEPARTMENT_OTHER): Payer: Self-pay | Admitting: Internal Medicine

## 2012-12-30 ENCOUNTER — Ambulatory Visit (HOSPITAL_COMMUNITY)
Admission: RE | Admit: 2012-12-30 | Discharge: 2012-12-30 | Disposition: A | Discharge status: Home / Self Care | Payer: Medicare Other | Source: Ambulatory Visit | Attending: Internal Medicine | Admitting: Internal Medicine

## 2012-12-30 DIAGNOSIS — M869 Osteomyelitis, unspecified: Secondary | ICD-10-CM

## 2012-12-30 DIAGNOSIS — S98119A Complete traumatic amputation of unspecified great toe, initial encounter: Secondary | ICD-10-CM | POA: Diagnosis not present

## 2012-12-30 DIAGNOSIS — E1169 Type 2 diabetes mellitus with other specified complication: Secondary | ICD-10-CM | POA: Diagnosis not present

## 2012-12-30 DIAGNOSIS — L97509 Non-pressure chronic ulcer of other part of unspecified foot with unspecified severity: Secondary | ICD-10-CM | POA: Diagnosis not present

## 2012-12-30 DIAGNOSIS — L97409 Non-pressure chronic ulcer of unspecified heel and midfoot with unspecified severity: Secondary | ICD-10-CM | POA: Diagnosis not present

## 2012-12-30 DIAGNOSIS — T8189XA Other complications of procedures, not elsewhere classified, initial encounter: Secondary | ICD-10-CM | POA: Diagnosis not present

## 2012-12-30 DIAGNOSIS — M799 Soft tissue disorder, unspecified: Secondary | ICD-10-CM | POA: Diagnosis not present

## 2012-12-30 DIAGNOSIS — A4901 Methicillin susceptible Staphylococcus aureus infection, unspecified site: Secondary | ICD-10-CM | POA: Diagnosis not present

## 2013-01-06 ENCOUNTER — Other Ambulatory Visit (HOSPITAL_BASED_OUTPATIENT_CLINIC_OR_DEPARTMENT_OTHER): Payer: Self-pay | Admitting: Internal Medicine

## 2013-01-06 DIAGNOSIS — A4901 Methicillin susceptible Staphylococcus aureus infection, unspecified site: Secondary | ICD-10-CM | POA: Diagnosis not present

## 2013-01-06 DIAGNOSIS — T8189XA Other complications of procedures, not elsewhere classified, initial encounter: Secondary | ICD-10-CM | POA: Diagnosis not present

## 2013-01-06 DIAGNOSIS — L97509 Non-pressure chronic ulcer of other part of unspecified foot with unspecified severity: Secondary | ICD-10-CM | POA: Diagnosis not present

## 2013-01-06 DIAGNOSIS — E1169 Type 2 diabetes mellitus with other specified complication: Secondary | ICD-10-CM | POA: Diagnosis not present

## 2013-01-06 DIAGNOSIS — M869 Osteomyelitis, unspecified: Secondary | ICD-10-CM

## 2013-01-06 DIAGNOSIS — L97409 Non-pressure chronic ulcer of unspecified heel and midfoot with unspecified severity: Secondary | ICD-10-CM | POA: Diagnosis not present

## 2013-01-07 ENCOUNTER — Encounter (INDEPENDENT_AMBULATORY_CARE_PROVIDER_SITE_OTHER): Payer: Medicare Other

## 2013-01-07 ENCOUNTER — Ambulatory Visit (INDEPENDENT_AMBULATORY_CARE_PROVIDER_SITE_OTHER): Payer: Medicare Other | Admitting: Pharmacist

## 2013-01-07 DIAGNOSIS — I6529 Occlusion and stenosis of unspecified carotid artery: Secondary | ICD-10-CM

## 2013-01-07 DIAGNOSIS — I359 Nonrheumatic aortic valve disorder, unspecified: Secondary | ICD-10-CM

## 2013-01-07 DIAGNOSIS — Z7901 Long term (current) use of anticoagulants: Secondary | ICD-10-CM

## 2013-01-07 LAB — POCT INR: INR: 3.8

## 2013-01-08 ENCOUNTER — Other Ambulatory Visit: Payer: Self-pay | Admitting: Cardiology

## 2013-01-10 ENCOUNTER — Other Ambulatory Visit: Payer: Self-pay | Admitting: Pharmacist

## 2013-01-11 ENCOUNTER — Other Ambulatory Visit (HOSPITAL_BASED_OUTPATIENT_CLINIC_OR_DEPARTMENT_OTHER): Payer: Self-pay | Admitting: Internal Medicine

## 2013-01-11 ENCOUNTER — Ambulatory Visit (HOSPITAL_COMMUNITY)
Admission: RE | Admit: 2013-01-11 | Discharge: 2013-01-11 | Disposition: A | Discharge status: Home / Self Care | Payer: Medicare Other | Source: Ambulatory Visit | Attending: Internal Medicine | Admitting: Internal Medicine

## 2013-01-11 ENCOUNTER — Encounter (HOSPITAL_COMMUNITY): Payer: Self-pay

## 2013-01-11 DIAGNOSIS — M869 Osteomyelitis, unspecified: Secondary | ICD-10-CM

## 2013-01-11 DIAGNOSIS — L03119 Cellulitis of unspecified part of limb: Secondary | ICD-10-CM | POA: Diagnosis not present

## 2013-01-11 DIAGNOSIS — L02619 Cutaneous abscess of unspecified foot: Secondary | ICD-10-CM | POA: Diagnosis not present

## 2013-01-11 DIAGNOSIS — S98119A Complete traumatic amputation of unspecified great toe, initial encounter: Secondary | ICD-10-CM | POA: Diagnosis not present

## 2013-01-11 DIAGNOSIS — M7989 Other specified soft tissue disorders: Secondary | ICD-10-CM | POA: Diagnosis not present

## 2013-01-11 MED ORDER — IOHEXOL 300 MG/ML  SOLN
100.0000 mL | Freq: Once | INTRAMUSCULAR | Status: AC | PRN
  Administered 2013-01-11: 100 mL via INTRAVENOUS

## 2013-01-13 DIAGNOSIS — A4901 Methicillin susceptible Staphylococcus aureus infection, unspecified site: Secondary | ICD-10-CM | POA: Diagnosis not present

## 2013-01-13 DIAGNOSIS — E1169 Type 2 diabetes mellitus with other specified complication: Secondary | ICD-10-CM | POA: Diagnosis not present

## 2013-01-13 DIAGNOSIS — L97409 Non-pressure chronic ulcer of unspecified heel and midfoot with unspecified severity: Secondary | ICD-10-CM | POA: Diagnosis not present

## 2013-01-13 DIAGNOSIS — L97509 Non-pressure chronic ulcer of other part of unspecified foot with unspecified severity: Secondary | ICD-10-CM | POA: Diagnosis not present

## 2013-01-20 ENCOUNTER — Ambulatory Visit (INDEPENDENT_AMBULATORY_CARE_PROVIDER_SITE_OTHER): Payer: Medicare Other | Admitting: *Deleted

## 2013-01-20 DIAGNOSIS — L97409 Non-pressure chronic ulcer of unspecified heel and midfoot with unspecified severity: Secondary | ICD-10-CM | POA: Diagnosis not present

## 2013-01-20 DIAGNOSIS — L97509 Non-pressure chronic ulcer of other part of unspecified foot with unspecified severity: Secondary | ICD-10-CM | POA: Diagnosis not present

## 2013-01-20 DIAGNOSIS — Z7901 Long term (current) use of anticoagulants: Secondary | ICD-10-CM | POA: Diagnosis not present

## 2013-01-20 DIAGNOSIS — I359 Nonrheumatic aortic valve disorder, unspecified: Secondary | ICD-10-CM | POA: Diagnosis not present

## 2013-01-20 DIAGNOSIS — A4901 Methicillin susceptible Staphylococcus aureus infection, unspecified site: Secondary | ICD-10-CM | POA: Diagnosis not present

## 2013-01-20 DIAGNOSIS — E1169 Type 2 diabetes mellitus with other specified complication: Secondary | ICD-10-CM | POA: Diagnosis not present

## 2013-01-20 LAB — POCT INR: INR: 2

## 2013-01-27 ENCOUNTER — Encounter (HOSPITAL_BASED_OUTPATIENT_CLINIC_OR_DEPARTMENT_OTHER): Payer: Medicare Other | Attending: Internal Medicine

## 2013-01-27 DIAGNOSIS — L84 Corns and callosities: Secondary | ICD-10-CM | POA: Diagnosis not present

## 2013-01-27 DIAGNOSIS — E1169 Type 2 diabetes mellitus with other specified complication: Secondary | ICD-10-CM | POA: Insufficient documentation

## 2013-01-27 DIAGNOSIS — L97409 Non-pressure chronic ulcer of unspecified heel and midfoot with unspecified severity: Secondary | ICD-10-CM | POA: Diagnosis not present

## 2013-02-03 ENCOUNTER — Ambulatory Visit (INDEPENDENT_AMBULATORY_CARE_PROVIDER_SITE_OTHER): Payer: Medicare Other | Admitting: *Deleted

## 2013-02-03 DIAGNOSIS — I442 Atrioventricular block, complete: Secondary | ICD-10-CM | POA: Diagnosis not present

## 2013-02-03 DIAGNOSIS — I359 Nonrheumatic aortic valve disorder, unspecified: Secondary | ICD-10-CM | POA: Diagnosis not present

## 2013-02-03 DIAGNOSIS — L84 Corns and callosities: Secondary | ICD-10-CM | POA: Diagnosis not present

## 2013-02-03 DIAGNOSIS — Z7901 Long term (current) use of anticoagulants: Secondary | ICD-10-CM | POA: Diagnosis not present

## 2013-02-03 DIAGNOSIS — L97409 Non-pressure chronic ulcer of unspecified heel and midfoot with unspecified severity: Secondary | ICD-10-CM | POA: Diagnosis not present

## 2013-02-03 DIAGNOSIS — E1169 Type 2 diabetes mellitus with other specified complication: Secondary | ICD-10-CM | POA: Diagnosis not present

## 2013-02-03 LAB — PACEMAKER DEVICE OBSERVATION
BATTERY VOLTAGE: 2.76 V
BMOD-0001RV: LOW
BRDY-0002RV: 60 {beats}/min
BRDY-0004RV: 120 {beats}/min
RV LEAD IMPEDENCE PM: 657 Ohm
RV LEAD THRESHOLD: 1 V

## 2013-02-03 NOTE — Progress Notes (Signed)
PPM check in office. 

## 2013-02-10 DIAGNOSIS — L84 Corns and callosities: Secondary | ICD-10-CM | POA: Diagnosis not present

## 2013-02-10 DIAGNOSIS — E1169 Type 2 diabetes mellitus with other specified complication: Secondary | ICD-10-CM | POA: Diagnosis not present

## 2013-02-10 DIAGNOSIS — L97409 Non-pressure chronic ulcer of unspecified heel and midfoot with unspecified severity: Secondary | ICD-10-CM | POA: Diagnosis not present

## 2013-02-17 DIAGNOSIS — L84 Corns and callosities: Secondary | ICD-10-CM | POA: Diagnosis not present

## 2013-02-17 DIAGNOSIS — L97409 Non-pressure chronic ulcer of unspecified heel and midfoot with unspecified severity: Secondary | ICD-10-CM | POA: Diagnosis not present

## 2013-02-17 DIAGNOSIS — E1169 Type 2 diabetes mellitus with other specified complication: Secondary | ICD-10-CM | POA: Diagnosis not present

## 2013-02-17 LAB — GLUCOSE, CAPILLARY: Glucose-Capillary: 201 mg/dL — ABNORMAL HIGH (ref 70–99)

## 2013-02-24 ENCOUNTER — Ambulatory Visit (INDEPENDENT_AMBULATORY_CARE_PROVIDER_SITE_OTHER): Payer: Medicare Other | Admitting: Pharmacist

## 2013-02-24 ENCOUNTER — Encounter (HOSPITAL_BASED_OUTPATIENT_CLINIC_OR_DEPARTMENT_OTHER): Payer: Medicare Other | Attending: Internal Medicine

## 2013-02-24 DIAGNOSIS — I359 Nonrheumatic aortic valve disorder, unspecified: Secondary | ICD-10-CM

## 2013-02-24 DIAGNOSIS — Z7901 Long term (current) use of anticoagulants: Secondary | ICD-10-CM

## 2013-02-24 DIAGNOSIS — L84 Corns and callosities: Secondary | ICD-10-CM | POA: Insufficient documentation

## 2013-02-24 DIAGNOSIS — E1169 Type 2 diabetes mellitus with other specified complication: Secondary | ICD-10-CM | POA: Insufficient documentation

## 2013-02-24 DIAGNOSIS — L97409 Non-pressure chronic ulcer of unspecified heel and midfoot with unspecified severity: Secondary | ICD-10-CM | POA: Insufficient documentation

## 2013-02-25 ENCOUNTER — Encounter: Payer: Self-pay | Admitting: Surgery

## 2013-02-26 ENCOUNTER — Encounter: Payer: Self-pay | Admitting: Internal Medicine

## 2013-02-28 ENCOUNTER — Ambulatory Visit: Payer: Medicare Other | Admitting: Surgery

## 2013-03-03 DIAGNOSIS — L97409 Non-pressure chronic ulcer of unspecified heel and midfoot with unspecified severity: Secondary | ICD-10-CM | POA: Diagnosis not present

## 2013-03-03 DIAGNOSIS — E1169 Type 2 diabetes mellitus with other specified complication: Secondary | ICD-10-CM | POA: Diagnosis not present

## 2013-03-03 DIAGNOSIS — L84 Corns and callosities: Secondary | ICD-10-CM | POA: Diagnosis not present

## 2013-03-21 ENCOUNTER — Ambulatory Visit (INDEPENDENT_AMBULATORY_CARE_PROVIDER_SITE_OTHER): Payer: Medicare Other | Admitting: *Deleted

## 2013-03-21 DIAGNOSIS — Z7901 Long term (current) use of anticoagulants: Secondary | ICD-10-CM

## 2013-03-21 DIAGNOSIS — I359 Nonrheumatic aortic valve disorder, unspecified: Secondary | ICD-10-CM | POA: Diagnosis not present

## 2013-03-21 LAB — POCT INR: INR: 2

## 2013-04-01 ENCOUNTER — Encounter: Payer: Self-pay | Admitting: Surgery

## 2013-04-04 ENCOUNTER — Ambulatory Visit (INDEPENDENT_AMBULATORY_CARE_PROVIDER_SITE_OTHER)
Admission: RE | Admit: 2013-04-04 | Discharge: 2013-04-04 | Disposition: A | Discharge status: Home / Self Care | Payer: Medicare Other | Source: Ambulatory Visit | Attending: Surgery | Admitting: Surgery

## 2013-04-04 ENCOUNTER — Ambulatory Visit (INDEPENDENT_AMBULATORY_CARE_PROVIDER_SITE_OTHER): Payer: Medicare Other | Admitting: Pharmacist

## 2013-04-04 ENCOUNTER — Ambulatory Visit (INDEPENDENT_AMBULATORY_CARE_PROVIDER_SITE_OTHER): Payer: Medicare Other | Admitting: Surgery

## 2013-04-04 ENCOUNTER — Ambulatory Visit (HOSPITAL_COMMUNITY)
Admission: RE | Admit: 2013-04-04 | Discharge: 2013-04-04 | Disposition: A | Discharge status: Home / Self Care | Payer: Medicare Other | Source: Ambulatory Visit | Attending: Surgery | Admitting: Surgery

## 2013-04-04 ENCOUNTER — Encounter: Payer: Self-pay | Admitting: Surgery

## 2013-04-04 VITALS — BP 114/66 | HR 66 | Resp 16 | Ht 68.0 in | Wt 224.0 lb

## 2013-04-04 DIAGNOSIS — Z48812 Encounter for surgical aftercare following surgery on the circulatory system: Secondary | ICD-10-CM

## 2013-04-04 DIAGNOSIS — I739 Peripheral vascular disease, unspecified: Secondary | ICD-10-CM

## 2013-04-04 DIAGNOSIS — Z7901 Long term (current) use of anticoagulants: Secondary | ICD-10-CM

## 2013-04-04 DIAGNOSIS — I359 Nonrheumatic aortic valve disorder, unspecified: Secondary | ICD-10-CM | POA: Diagnosis not present

## 2013-04-04 LAB — POCT INR: INR: 1.3

## 2013-04-04 NOTE — Progress Notes (Signed)
[facility] [place]   Patient name: [name]. MRN: [id number] DOB: [birth year] Sex: male     Chief Complaint  Patient presents with  . Re-evaluation    3 month f/u s/p L fem artery stent 11/23/2012    HISTORY OF PRESENT ILLNESS: The patient was seen earlier this year with a right heel ulcer x2 as well as a breakdown of the toe amputation site at the left great toe.  He has previously undergone left superficial femoral artery stenting and subintimal recanalization of an occluded left anterior tibial artery with balloon angioplasty in 2011.  This was done in the setting of an ischemic left toe ulcer which ultimately required left great toe amputation.  In May of 2014, he underwent successful recanalization of an occluded right anterior tibial artery with subsequent balloon angioplasty using a 3 mm balloon.  He was brought back several weeks later to address a high-grade stenosis vs. occlusion of his left superficial femoral artery stent.  He underwent successful stenting of a nearly occluded left superficial femoral artery stent using a 7 x 1 20 self-expanding stent.  This was done in June of 2014.  The patient has gone on to receive wound care at the wound center and has now completely healed all of his ulcers.  He has no complaints today.  Past Medical History  Diagnosis Date  . Erectile dysfunction   . Obesity   . OSA (obstructive sleep apnea)     not using CPAP  . History of nephrolithiasis   . HTN (hypertension)   . Aortal stenosis 2006    biophrostetic  . Atrial fibrillation     onset after CABG--- [facility]  . S/P aortic valve replacement   . CHF (congestive heart failure)     abter CABG  . Pacemaker 2/07    VVI  . PVD (peripheral vascular disease)     CT angio showed- vascular insuff, intestine and RAS bilaterally, andgiogram 02/2009 severe PVD medical management  . Skin cancer     surgery (nose) 09-2010  . Sick sinus  syndrome     [name]  . Hyperlipidemia   . Arthritis   . Gangrene   . Coronary artery disease   . DM type 2 (diabetes mellitus, type 2)     used to see Dr Roanna Raider, now f/u by Dr Drue Novel    Past Surgical History  Procedure Laterality Date  . Aortic valve replacement      biophrostetic for Ao Stenosis 2006  . Coronary artery bypass graft  2006  . Tonsillectomy    . Cataract extraction, bilateral    . Amputation  01/2010    great toe  . Pacemaker placement  07/2005    Medtronic Sigma SR implanted by Dr Juanda Chance  . Aortic valve replacement  01/17/2011    S/P redo median sternotomy, extracorporeal circulation, redo Aortic Valve Replacement using a 23-mm Edwards pericardial Magna-Ease valve, Dr Concepcion Living  . Angioplasty / stenting femoral  02/10/10    Left femoral  . Incise and drain abcess Right May 2014    right heel     History   Social History  . Marital Status: Married    Spouse Name: N/A    Number of Children: 2  . Years of Education: N/A   Occupational History  . 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 Smoking status: Former Smoker  Types: Cigarettes    Quit date: 06/23/1988  . Smokeless tobacco: Former Neurosurgeon  . Alcohol Use: No  . Drug Use: No  . Sexual Activity: Not Currently   Other Topics Concern  . Not on file   Social History Narrative   Diet: healthy most of the time--   2 step children                Family History  Problem Relation Age of Onset  . Heart attack Father 48  . Stroke Mother 58  . Heart disease Sister 45     also stomach aneurysm  . Cancer Neg Hx     colon, prostate  . Diabetes Sister     2 sister w/ DM  . Colon cancer Neg Hx   . Prostate cancer Neg Hx     Allergies as of 04/04/2013 - Review Complete 04/04/2013  Allergen Reaction Noted  . Metoprolol succinate Other (See Comments) 12/23/2006  . Potassium-containing compounds  09/26/2010    Current Outpatient Prescriptions on  File Prior to Visit  Medication Sig Dispense Refill  . aspirin EC 81 MG tablet Take 81 mg by mouth daily.      Marland Kitchen ezetimibe-simvastatin (VYTORIN) 10-40 MG per tablet Take 1 tablet by mouth at bedtime.  30 tablet  3  . furosemide (LASIX) 80 MG tablet Take 40-160 mg by mouth See admin instructions. Take 160mg  (2 tabs) every morning. Take an additional 40mg  (1/2 tab) on Mon, Wed, Fri in the pm      . furosemide (LASIX) 80 MG tablet take 2 tablets by mouth once daily PLUS AN EXTRA 1 TABLET EVERY OTHER DAY AT 4:00 PM.  120 tablet  3  . insulin glargine (LANTUS) 100 UNIT/ML injection Inject 65-80 Units into the skin at bedtime.       . insulin glulisine (APIDRA) 100 UNIT/ML injection Inject 8-14 Units into the skin 3 (three) times daily with meals. Sliding scale as directed      . lisinopril (PRINIVIL,ZESTRIL) 5 MG tablet take 1 tablet by mouth once daily for high blood pressure  30 tablet  9  . metoprolol tartrate (LOPRESSOR) 25 MG tablet Take 12.5 mg by mouth 2 (two) times daily.      Marland Kitchen spironolactone (ALDACTONE) 25 MG tablet take 1 tablet by mouth once daily  90 tablet  3  . warfarin (COUMADIN) 5 MG tablet take 1 tablet by mouth once daily  120 tablet  1   No current facility-administered medications on file prior to visit.     REVIEW OF SYSTEMS: See history of present illness otherwise negative PHYSICAL EXAMINATION:   Vital signs are BP 114/66  Pulse 66  Resp 16  Ht 5\' 8"  (1.727 m)  Wt 224 lb (101.606 kg)  BMI 34.07 kg/m2  SpO2 95% General: The patient appears their stated age. HEENT:  No gross abnormalities Pulmonary:  Non labored breathing Musculoskeletal: There are no major deformities. Neurologic: No focal weakness or paresthesias are detected, Skin: There are no ulcer or rashes noted. Psychiatric: The patient has normal affect. Cardiovascular: There is a regular rate and rhythm without significant murmur appreciated. No carotid bruits.  I cannot palpate pedal pulses  Diagnostic  Studies Duplex has been ordered and reviewed.  No significant stenosis is seen throughout bilateral lower extremities.  The patient has triphasic waveforms from the trifurcation proximally and monophasic distally.  ABIs could not be calculated because of calcified vessels.  Waveforms are monophasic  Assessment: PVD,status post  bilateral percutaneous intervention Plan: With most recent interventions, the patient was able to heal bilateral lower extremity wounds.  I stressed the importance of continued medical surveillance of his percutaneous interventions.  His next that he will be in 6 months.  [name], M.D. [facility] [place] Office: [phone] Pager:  [phone]

## 2013-04-05 NOTE — Addendum Note (Signed)
Addended by: Sharee Pimple on: 04/05/2013 08:36 AM   Modules accepted: Orders

## 2013-04-25 ENCOUNTER — Other Ambulatory Visit: Payer: Self-pay | Admitting: Cardiology

## 2013-05-12 ENCOUNTER — Ambulatory Visit (INDEPENDENT_AMBULATORY_CARE_PROVIDER_SITE_OTHER): Payer: Medicare Other | Admitting: Neurology

## 2013-05-12 ENCOUNTER — Encounter: Payer: Self-pay | Admitting: Neurology

## 2013-05-12 ENCOUNTER — Ambulatory Visit (INDEPENDENT_AMBULATORY_CARE_PROVIDER_SITE_OTHER): Payer: Medicare Other | Admitting: Pharmacist

## 2013-05-12 VITALS — BP 129/74 | HR 80 | Wt 229.0 lb

## 2013-05-12 DIAGNOSIS — R413 Other amnesia: Secondary | ICD-10-CM | POA: Diagnosis not present

## 2013-05-12 DIAGNOSIS — Z7901 Long term (current) use of anticoagulants: Secondary | ICD-10-CM

## 2013-05-12 DIAGNOSIS — I359 Nonrheumatic aortic valve disorder, unspecified: Secondary | ICD-10-CM | POA: Diagnosis not present

## 2013-05-12 LAB — POCT INR: INR: 2

## 2013-05-12 MED ORDER — DONEPEZIL HCL 5 MG PO TABS: 5.0000 mg | ORAL_TABLET | Freq: Every day | ORAL | Status: DC

## 2013-05-12 NOTE — Progress Notes (Signed)
Reason for visit:  Memory disturbance  [name]. is an 78 y.o. male  History of present illness:  [name] is a 78 year old right-handed white male with a history of a progressive memory disturbance. The patient was last seen in August of 2013, and he has not been seen through this office since. The patient returns with his sister today, and she indicates that there has been some progression in memory. The patient still operates a motor vehicle, but he is starting to have some issues with directions. The patient never has done the finances, his wife handles this. The patient has had a lot of problems with peripheral vascular disease, and he has had a femoral stent placed on the left, and an angioplasty procedure on the right lower extremity, and amputation of the great toe on the left. The patient has diabetes, and he indicates that he has not had any issues with hypoglycemia. The patient himself does not realize that there is much in the way of issues with memory problems. The sister indicates that there is wide variability in his cognitive functioning level day-to-day, and he may have good days and bad days. The patient has sleep apnea, not on CPAP. The patient will sleep 3 or 4 hours at a time day and night. The patient is quite inactive. The patient denies any severe balance problems or recent falls. A CT scan of the head was last done in 2012 showing mild small vessel disease. Blood work done through our office on the last visit was unremarkable.  Past Medical History  Diagnosis Date  . Erectile dysfunction   . Obesity   . OSA (obstructive sleep apnea)     not using CPAP  . History of nephrolithiasis   . HTN (hypertension)   . Aortal stenosis 2006    biophrostetic  . Atrial fibrillation     onset after CABG--- [facility]  . S/P aortic valve replacement   . CHF (congestive heart failure)     abter CABG  . Pacemaker 2/07    VVI  . PVD  (peripheral vascular disease)     CT angio showed- vascular insuff, intestine and RAS bilaterally, andgiogram 02/2009 severe PVD medical management  . Skin cancer     surgery (nose) 09-2010  . Sick sinus syndrome     [name]  . Hyperlipidemia   . Arthritis   . Gangrene   . Coronary artery disease   . DM type 2 (diabetes mellitus, type 2)     used to see Dr Roanna Raider, now f/u by Dr Drue Novel  . Memory loss     Past Surgical History  Procedure Laterality Date  . Aortic valve replacement      biophrostetic for Ao Stenosis 2006  . Coronary artery bypass graft  2006  . Tonsillectomy    . Cataract extraction, bilateral    . Amputation  01/2010    great toe  . Pacemaker placement  07/2005    Medtronic Sigma SR implanted by Dr Juanda Chance  . Aortic valve replacement  01/17/2011    S/P redo median sternotomy, extracorporeal circulation, redo Aortic Valve Replacement using a 23-mm Edwards pericardial Magna-Ease valve, Dr Concepcion Living  . Angioplasty / stenting femoral  02/10/10    Left femoral  . Incise and drain abcess Right May 2014    right heel     Family History  Problem Relation Age of Onset  . Heart attack Father  70  . Stroke Mother 74  . Heart disease Sister 82     also stomach aneurysm  . Cancer Neg Hx     colon, prostate  . Diabetes Sister     2 sister w/ DM  . Colon cancer Neg Hx   . Prostate cancer Neg Hx     Social history:  reports that he quit smoking about 24 years ago. His smoking use included Cigarettes. He smoked 0.00 packs per day. He has quit using smokeless tobacco. He reports that he does not drink alcohol or use illicit drugs.    Allergies  Allergen Reactions  . Metoprolol Succinate Other (See Comments)    Extremely tired  . Potassium-Containing Compounds     IV--loss of conciousness    Medications:  Current Outpatient Prescriptions on File Prior to Visit  Medication Sig Dispense Refill  . aspirin EC 81 MG tablet Take 81 mg by mouth daily.        Marland Kitchen ezetimibe-simvastatin (VYTORIN) 10-40 MG per tablet Take 1 tablet by mouth at bedtime.  30 tablet  3  . furosemide (LASIX) 80 MG tablet take 2 tablets by mouth once daily PLUS AN EXTRA 1 TABLET EVERY OTHER DAY AT 4:00 PM.  120 tablet  3  . insulin glargine (LANTUS) 100 UNIT/ML injection Inject 65-80 Units into the skin at bedtime.       . insulin glulisine (APIDRA) 100 UNIT/ML injection Inject 8-14 Units into the skin 3 (three) times daily with meals. Sliding scale as directed      . lisinopril (PRINIVIL,ZESTRIL) 5 MG tablet take 1 tablet by mouth once daily for high blood pressure  30 tablet  1  . metoprolol tartrate (LOPRESSOR) 25 MG tablet Take 12.5 mg by mouth 2 (two) times daily.      Marland Kitchen spironolactone (ALDACTONE) 25 MG tablet take 1 tablet by mouth once daily  90 tablet  3  . warfarin (COUMADIN) 5 MG tablet take 1 tablet by mouth once daily  120 tablet  1  . furosemide (LASIX) 80 MG tablet Take 40-160 mg by mouth See admin instructions. Take 160mg  (2 tabs) every morning. Take an additional 40mg  (1/2 tab) on Mon, Wed, Fri in the pm       No current facility-administered medications on file prior to visit.    ROS:  Out of a complete 14 system review of symptoms, the patient complains only of the following symptoms, and all other reviewed systems are negative.  Memory loss, confusion Depression Snoring  Blood pressure 129/74, pulse 80, weight 229 lb (103.874 kg).  Physical Exam  General: The patient is alert and cooperative at the time of the examination. The patient is moderately obese.  Skin: No significant peripheral edema is noted.   Neurologic Exam  Mental status: The Mini-Mental status examination done today shows a total score of 24/30.  Cranial nerves: Facial symmetry is present. Speech is normal, no aphasia or dysarthria is noted. Extraocular movements are full. Visual fields are full.  Motor: The patient has good strength in all 4 extremities.  Sensory  examination: Soft touch sensation is symmetric on the face, arms, and legs. The patient has a stocking pattern pinprick sensory deficit up to the knees bilaterally.  Coordination: The patient has good finger-nose-finger and heel-to-shin bilaterally.  Gait and station: The patient has a normal gait. Tandem gait is unsteady. Romberg is negative. No drift is seen.  Reflexes: Deep tendon reflexes are symmetric, but are depressed.   Assessment/Plan:  1. Progressive memory disturbance  2. Obstructive sleep apnea, not on CPAP  3. Peripheral vascular disease  4. Diabetes  The patient will be placed on Aricept at this point. The patient will followup through this office in 6 or 7 months. The patient will contact me if there are tolerance issues with the medication.  Marlan Palau MD 05/12/2013 7:22 PM  [facility] [address] [place], [place] [address]  Phone [phone] Fax [phone]

## 2013-05-12 NOTE — Patient Instructions (Signed)
Began Aricept (donepezil) at 5 mg at night for one month. If this medication is well-tolerated, please call our office and we will call in a prescription for the 10 mg tablets. Look out for side effects that may include nausea, diarrhea, weight loss, or stomach cramps. This medication will also cause a runny nose, therefore there is no need for allergy medications for this purpose.    

## 2013-05-27 ENCOUNTER — Encounter: Payer: Self-pay | Admitting: Internal Medicine

## 2013-05-27 DIAGNOSIS — I4891 Unspecified atrial fibrillation: Secondary | ICD-10-CM | POA: Diagnosis not present

## 2013-06-09 ENCOUNTER — Ambulatory Visit (INDEPENDENT_AMBULATORY_CARE_PROVIDER_SITE_OTHER): Payer: Medicare Other | Admitting: Pharmacist

## 2013-06-09 DIAGNOSIS — Z7901 Long term (current) use of anticoagulants: Secondary | ICD-10-CM | POA: Diagnosis not present

## 2013-06-09 DIAGNOSIS — I359 Nonrheumatic aortic valve disorder, unspecified: Secondary | ICD-10-CM

## 2013-06-09 LAB — POCT INR: INR: 2.6

## 2013-06-13 ENCOUNTER — Telehealth: Payer: Self-pay | Admitting: Cardiology

## 2013-06-13 NOTE — Telephone Encounter (Signed)
I spoke with the pt's wife and the pt was given a Rx for Aricept at 05/12/13 appointment with Dr Anne Hahn. The pt's wife has not had this medication filled because she has an uneasy feeling about the medicine and wants to know if the pt can take it from a cardiology stand point.  In reviewing the pt's chart the last time he was seen by Dr Shirlee Latch was 03/18/12.  The pt is overdue for follow-up and I have scheduled him to see Dr Shirlee Latch on 07/18/13. I made the pt's wife aware that Dr Shirlee Latch is currently out of the office this week and I will forward this message to him for review when he is back in the office next week.

## 2013-06-13 NOTE — Telephone Encounter (Signed)
I do not think that Aricept would interact with his cardiac meds.  He does need a followup with me.

## 2013-06-13 NOTE — Telephone Encounter (Signed)
New problem    Pt's wife needs to know weather pt can take ARCEPT?   Today she will pick it up ?   Please give them a call back.

## 2013-06-14 NOTE — Telephone Encounter (Signed)
Pt's wife aware of Dr Alford Highland comments and I advised her to keep scheduled appointment in January.

## 2013-07-02 ENCOUNTER — Other Ambulatory Visit: Payer: Self-pay | Admitting: Cardiology

## 2013-07-04 ENCOUNTER — Other Ambulatory Visit: Payer: Self-pay

## 2013-07-04 MED ORDER — LISINOPRIL 5 MG PO TABS: ORAL_TABLET | ORAL | Status: DC

## 2013-07-18 ENCOUNTER — Telehealth: Payer: Self-pay | Admitting: Cardiology

## 2013-07-18 ENCOUNTER — Ambulatory Visit (INDEPENDENT_AMBULATORY_CARE_PROVIDER_SITE_OTHER): Payer: Medicare Other | Admitting: Cardiology

## 2013-07-18 ENCOUNTER — Ambulatory Visit (INDEPENDENT_AMBULATORY_CARE_PROVIDER_SITE_OTHER): Payer: Medicare Other

## 2013-07-18 ENCOUNTER — Encounter: Payer: Self-pay | Admitting: Cardiology

## 2013-07-18 VITALS — BP 138/64 | HR 65 | Ht 68.0 in | Wt 229.0 lb

## 2013-07-18 DIAGNOSIS — I359 Nonrheumatic aortic valve disorder, unspecified: Secondary | ICD-10-CM

## 2013-07-18 DIAGNOSIS — I4891 Unspecified atrial fibrillation: Secondary | ICD-10-CM

## 2013-07-18 DIAGNOSIS — Z0000 Encounter for general adult medical examination without abnormal findings: Secondary | ICD-10-CM | POA: Diagnosis not present

## 2013-07-18 DIAGNOSIS — Z7901 Long term (current) use of anticoagulants: Secondary | ICD-10-CM

## 2013-07-18 DIAGNOSIS — Z950 Presence of cardiac pacemaker: Secondary | ICD-10-CM

## 2013-07-18 DIAGNOSIS — I5030 Unspecified diastolic (congestive) heart failure: Secondary | ICD-10-CM

## 2013-07-18 DIAGNOSIS — E785 Hyperlipidemia, unspecified: Secondary | ICD-10-CM

## 2013-07-18 DIAGNOSIS — Z5181 Encounter for therapeutic drug level monitoring: Secondary | ICD-10-CM

## 2013-07-18 DIAGNOSIS — Z954 Presence of other heart-valve replacement: Secondary | ICD-10-CM

## 2013-07-18 DIAGNOSIS — Z23 Encounter for immunization: Secondary | ICD-10-CM | POA: Diagnosis not present

## 2013-07-18 DIAGNOSIS — R413 Other amnesia: Secondary | ICD-10-CM

## 2013-07-18 DIAGNOSIS — I2510 Atherosclerotic heart disease of native coronary artery without angina pectoris: Secondary | ICD-10-CM | POA: Diagnosis not present

## 2013-07-18 DIAGNOSIS — I7389 Other specified peripheral vascular diseases: Secondary | ICD-10-CM

## 2013-07-18 DIAGNOSIS — I5032 Chronic diastolic (congestive) heart failure: Secondary | ICD-10-CM

## 2013-07-18 DIAGNOSIS — I4821 Permanent atrial fibrillation: Secondary | ICD-10-CM

## 2013-07-18 LAB — LIPID PANEL
CHOLESTEROL: 162 mg/dL (ref 0–200)
HDL: 32.3 mg/dL — ABNORMAL LOW (ref >39.00)
LDL Cholesterol: 95 mg/dL (ref 0–99)
TRIGLYCERIDES: 172 mg/dL — AB (ref 0.0–149.0)
Total CHOL/HDL Ratio: 5
VLDL: 34.4 mg/dL (ref 0.0–40.0)

## 2013-07-18 LAB — CBC WITH DIFFERENTIAL/PLATELET
BASOS ABS: 0.1 10*3/uL (ref 0.0–0.1)
Basophils Relative: 0.5 % (ref 0.0–3.0)
EOS PCT: 3.6 % (ref 0.0–5.0)
Eosinophils Absolute: 0.4 10*3/uL (ref 0.0–0.7)
HEMATOCRIT: 41.4 % (ref 39.0–52.0)
Hemoglobin: 14 g/dL (ref 13.0–17.0)
LYMPHS ABS: 3.3 10*3/uL (ref 0.7–4.0)
Lymphocytes Relative: 27.7 % (ref 12.0–46.0)
MCHC: 33.9 g/dL (ref 30.0–36.0)
MCV: 90.7 fl (ref 78.0–100.0)
Monocytes Absolute: 1 10*3/uL (ref 0.1–1.0)
Monocytes Relative: 8.3 % (ref 3.0–12.0)
NEUTROS PCT: 59.9 % (ref 43.0–77.0)
Neutro Abs: 7.2 10*3/uL (ref 1.4–7.7)
PLATELETS: 183 10*3/uL (ref 150.0–400.0)
RBC: 4.57 Mil/uL (ref 4.22–5.81)
RDW: 14.3 % (ref 11.5–14.6)
WBC: 12 10*3/uL — AB (ref 4.5–10.5)

## 2013-07-18 LAB — BASIC METABOLIC PANEL
BUN: 14 mg/dL (ref 6–23)
CO2: 34 mEq/L — ABNORMAL HIGH (ref 19–32)
CREATININE: 1.3 mg/dL (ref 0.4–1.5)
Calcium: 9.5 mg/dL (ref 8.4–10.5)
Chloride: 93 mEq/L — ABNORMAL LOW (ref 96–112)
GFR: 56.52 mL/min — AB (ref >60.00)
GLUCOSE: 265 mg/dL — AB (ref 70–99)
Potassium: 3.9 mEq/L (ref 3.5–5.1)
Sodium: 134 mEq/L — ABNORMAL LOW (ref 135–145)

## 2013-07-18 LAB — POCT INR: INR: 3.2

## 2013-07-18 MED ORDER — WARFARIN SODIUM 5 MG PO TABS: ORAL_TABLET | ORAL | Status: DC

## 2013-07-18 MED ORDER — SPIRONOLACTONE 25 MG PO TABS: ORAL_TABLET | ORAL | Status: DC

## 2013-07-18 MED ORDER — LISINOPRIL 5 MG PO TABS: ORAL_TABLET | ORAL | Status: DC

## 2013-07-18 NOTE — Patient Instructions (Addendum)
Your physician recommends that you have lab work today--Lipid profile/BMET/CBCd.  Your physician wants you to follow-up in: 4 months with Dr Aundra Dubin. (May 2015).  You will receive a reminder letter in the mail two months in advance. If you don't receive a letter, please call our office to schedule the follow-up appointment.   You were given a flu shot today.  Refills for lisinopril, spironolactone and warfarin were sent in electronically to [facility].   The patient assistance application for Vytorin 10/40 mg was completed today and will be mailed in to [employer].

## 2013-07-19 NOTE — Progress Notes (Signed)
Patient ID: Seth Pacific., male   DOB: 1934/10/09, 79 y.o.   MRN: [id number] PCP: Dr. Larose Kells  79 yo with history of AS s/p redo AVR, diastolic CHF, CAD, chronic atrial fibrillation, s/p pacemaker presents for cardiology followup.  Patient had redo AVR with #23 Edwards pericardial valve in 7/12.  Cath prior to surgery showed stable disease with patent grafts.  He follows with [facility] for PAD.   I have not seen him for over a year.  Since I last saw him, he has started to have trouble with his memory and is now on Aricept.  He comes with his sister today.  He had PTCA to the right anterior tibial artery in 5/14 and PCI to the left SFA in 6/14.  He says that he does not get short of breath walking around his house and can climb a flight of steps.  He is not very active. No chest pain.  No foot ulcers.     Labs (1/12): LDL 71, HDL 29, K 4.7, creatinine 1.1 Labs (4/12): K 4, creatinine 1.0, BNP 262 Labs (7/12): LDL 92, HDL 32 Labs (8/12): K 4.3, creatinine 1.15 => 1.1, BNP 174 Labs (9/12): K 4, creatinine 0.9, BNP 126 Labs (1/13): BNP 130, K 3.6, creatinine 1.5 Labs (3/13): K 4.8, creatinine 1.1 Labs (6/13): LDL 64 Labs (7/13): BNP 446 => 200, K 4.6, creatinine 1.1 Labs (6/14): K 4.6, creatinine 1.3  ECG: atrial fibrillation, v-paced.   PMH:  1. Aortic stenosis: s/p bioprosthetic aortic valve replacement in 06/2004.  Echo in 7/11 showed mean gradient 35 mmHg across the bioprosthetic valve.  Echo in 6/12 showed worsening of the bioprosthetic mean gradient to 46 mmHg.  TEE in 7/12 showed only 1 cusp of the bioprosthetic aortic valve moving (heavily calcified), mean 52 mmHg/peak gradient 76 mmHg. Patient had redo AVR (7/12) with #23 Edwards pericardial valve.  Echo (8/12) post AVR showed EF 55%, inferior/inferoseptal HK, bioprosthetic aortic valve with mean gradient 16/peak 28, mild MR, moderately dilated RV with mildly decreased RV systolic function, PASP 68 mmHg.  Echo (2/13) with EF 55-60%, mild mitral  stenosis and mild MR, bioprosthetic aortic valve with mean gradient 14 mmHg, mildly dilated RV with mild to moderate systolic dysfunction, PA systolic pressure 61 mmHg.  2. CAD: CABG with AVR in 1/06.  Patient had SVG-OM and SVG-D.  LHC (7/12) showed 50% distal RCA lesion extending into PLV and PDA, total occlusion CFX, 50% pLAD, 90% ostial D1, patent SVG-D1 and SVG-OM.  3. Diabetes 4. Hyperlipidemia 5. HTN 6. Sick sinus syndrome with Medtronic dual chamber PCM 7. Obesity 8. OSA: Not using CPAP (cannot tolerate) 9. Chronic diastolic CHF: Echo in 3/32 showed EF 60-65% with moderate LV hypertrophy, moderate diastolic dysfunction, mild mitral stenosis, bioprosthetic aortic valve with mean gradient 35 mmHg (moderate stenosis) and PA systolic pressure 69 mmHg.   Echo (6/12) showed EF 60-65%, moderate LV hypertrophy, bioprosthetic aortic valve with mean gradient 46 mmHg (severe stenosis) and PA systolic pressure 74 mmHg, mild MR.  10. Nephrolithiasis 11. PAD: Has seen [facility].  Lower extremity angiography in 9/10 showed occlusion of all 3 vessels below the knees in both legs.  Patient then had L SFA stent and PTCA left AT by Dr. Trula Slade in 2011.  He had amputation of the left great toe in 8/11. 5/14 PTCA to right anterior tibial artery.  6/14 PCI to left SFA.  12. Atrial fibrillation: Chronic, on coumadin.  LAA thrombus noted on TEE in  7/12.  13. Carotid stenosis: carotid dopplers (7/11) with 40-59% bilateral ICA stenosis.  Carotids (6/12) with 40-59% bilateral ICA stenosis.  Carotids (7/13) with 40-59% bilateral ICA stenosis. Carotids (7/14) with 40-59% bilateral ICA stenosis.  14. Beta blocker intolerance.  35. Dementia  SH: Married, lives in [place].  Retired Administrator.  Works part time at [employer]. Quit smoking in 1990 (smoked heavily prior to this).   FH: Father with MI at 72, mother with CVA at 39  ROS: All systems reviewed and negative except as per HPI.     Current Outpatient Prescriptions  Medication Sig Dispense Refill  . aspirin EC 81 MG tablet Take 81 mg by mouth daily.      Marland Kitchen donepezil (ARICEPT) 5 MG tablet Take 1 tablet (5 mg total) by mouth at bedtime.  30 tablet  1  . ezetimibe-simvastatin (VYTORIN) 10-40 MG per tablet Take 1 tablet by mouth at bedtime.  30 tablet  3  . furosemide (LASIX) 80 MG tablet take 2 tablets by mouth once daily PLUS AN EXTRA 1 TABLET EVERY OTHER DAY AT 4:00 PM.  120 tablet  3  . insulin glargine (LANTUS) 100 UNIT/ML injection Inject 65-80 Units into the skin at bedtime.       . insulin glulisine (APIDRA) 100 UNIT/ML injection Inject 8-14 Units into the skin 3 (three) times daily with meals. Sliding scale as directed      . lisinopril (PRINIVIL,ZESTRIL) 5 MG tablet take 1 tablet by mouth once daily for high blood pressure  30 tablet  6  . metoprolol tartrate (LOPRESSOR) 25 MG tablet Take 12.5 mg by mouth 2 (two) times daily.      Marland Kitchen spironolactone (ALDACTONE) 25 MG tablet take 1 tablet by mouth once daily  30 tablet  6  . warfarin (COUMADIN) 5 MG tablet Take as directed by anticoagulation clinic  120 tablet  1   No current facility-administered medications for this visit.    BP 138/64  Pulse 65  Ht 5\' 8"  (1.727 m)  Wt 103.874 kg (229 lb)  BMI 34.83 kg/m2 General: obese, NAD Neck: JVP 7-8 cm, no thyromegaly or thyroid nodule.  Lungs: Slight crackles at bases bilaterally. CV: Nondisplaced PMI.  Heart irregular S1/S2, no S3/S4, 2/6 early SEM.  1+ ankle edema bilaterally.  Unable to palpate pedal pulses.  Bilateral soft carotid bruits. Abdomen: Soft, nontender, no hepatosplenomegaly, no distention.  Neurologic: He does seem to have memory difficulty.  Psych: Normal affect. Extremities: No clubbing or cyanosis. No pedal ulcers.  Assessment/Plan: AORTIC VALVE REPLACEMENT Last echo in 2/13 showed normal function of bioprosthetic aortic valve.  Carotid stenosis Stable mild to moderate stenosis, repeat  carotids in 7/15.  PAD Followed by Dr Trula Slade, s/p interventions in 2014.  No pedal ulcers.   CORONARY ARTERY DISEASE  Stable with patent grafts on 7/12 cath. Denies chest pain.  Continue ASA 81, Vytorin, ACEI, low dose beta blocker. HYPERLIPIDEMIA Check lipids today. Permanent atrial fibrillation  Chronic. LAA thrombus noted on TEE prior to AVR. Continue coumadin. Should be bridged with heparin or Lovenox if he needs to come off coumadin in the future.  CBC today.  Diastolic heart failure NYHA class II symptoms, stable.  He is not markedly volume overloaded.  Continue current Lasix dosing (80 qam and every other day 40 qpm).  Check BMET/BNP today.  Dementia Now on Aricept.   Loralie Champagne 07/19/2013

## 2013-07-27 ENCOUNTER — Encounter: Payer: Self-pay | Admitting: *Deleted

## 2013-08-04 ENCOUNTER — Telehealth: Payer: Self-pay | Admitting: *Deleted

## 2013-08-04 NOTE — Telephone Encounter (Signed)
[employer] called to check status of Vytorin 10-40, the pharmacy is in process of filling now, will call wife and let her know.

## 2013-08-17 ENCOUNTER — Other Ambulatory Visit: Payer: Self-pay | Admitting: Neurology

## 2013-08-17 NOTE — Telephone Encounter (Signed)
I spoke with patient who says he is doing well on Aricept 5mg  and does not wish to go up to 10mg  at this time.

## 2013-09-28 ENCOUNTER — Ambulatory Visit (INDEPENDENT_AMBULATORY_CARE_PROVIDER_SITE_OTHER): Payer: Medicare Other | Admitting: Pharmacist

## 2013-09-28 DIAGNOSIS — Z5181 Encounter for therapeutic drug level monitoring: Secondary | ICD-10-CM | POA: Diagnosis not present

## 2013-09-28 DIAGNOSIS — I359 Nonrheumatic aortic valve disorder, unspecified: Secondary | ICD-10-CM

## 2013-09-28 DIAGNOSIS — Z7901 Long term (current) use of anticoagulants: Secondary | ICD-10-CM | POA: Diagnosis not present

## 2013-09-28 LAB — POCT INR: INR: 2.5

## 2013-09-29 ENCOUNTER — Encounter: Payer: Self-pay | Admitting: Gastroenterology

## 2013-10-04 ENCOUNTER — Encounter: Payer: Self-pay | Admitting: Family

## 2013-10-05 ENCOUNTER — Ambulatory Visit (HOSPITAL_COMMUNITY)
Admission: RE | Admit: 2013-10-05 | Discharge: 2013-10-05 | Disposition: A | Discharge status: Home / Self Care | Payer: Medicare Other | Source: Ambulatory Visit | Attending: Family | Admitting: Family

## 2013-10-05 ENCOUNTER — Ambulatory Visit (INDEPENDENT_AMBULATORY_CARE_PROVIDER_SITE_OTHER)
Admission: RE | Admit: 2013-10-05 | Discharge: 2013-10-05 | Disposition: A | Discharge status: Home / Self Care | Payer: Medicare Other | Source: Ambulatory Visit | Attending: Surgery | Admitting: Surgery

## 2013-10-05 ENCOUNTER — Encounter: Payer: Self-pay | Admitting: Family

## 2013-10-05 ENCOUNTER — Ambulatory Visit (INDEPENDENT_AMBULATORY_CARE_PROVIDER_SITE_OTHER): Payer: Medicare Other | Admitting: Family

## 2013-10-05 VITALS — BP 116/73 | HR 67 | Resp 16 | Ht 68.5 in | Wt 231.0 lb

## 2013-10-05 DIAGNOSIS — I739 Peripheral vascular disease, unspecified: Secondary | ICD-10-CM

## 2013-10-05 DIAGNOSIS — Z48812 Encounter for surgical aftercare following surgery on the circulatory system: Secondary | ICD-10-CM

## 2013-10-05 NOTE — Progress Notes (Signed)
[facility] [place] HISTORY AND PHYSICAL -PAD  History of Present Illness Seth Khosla. is a 79 y.o. male patient of Dr. Trula Slade. The patient was seen earlier this year with a right heel ulcer x2 as well as a breakdown of the toe amputation site at the left great toe. He has previously undergone left superficial femoral artery stenting and subintimal recanalization of an occluded left anterior tibial artery with balloon angioplasty in 2011. This was done in the setting of an ischemic left toe ulcer which ultimately required left great toe amputation. In May of 2014, he underwent successful recanalization of an occluded right anterior tibial artery with subsequent balloon angioplasty using a 3 mm balloon. He was brought back several weeks later to address a high-grade stenosis vs. occlusion of his left superficial femoral artery stent. He underwent successful stenting of a nearly occluded left superficial femoral artery stent using a 7 x 1 20 self-expanding stent. This was done in June of 2014. The patient has gone on to receive wound care at the wound center and has now completely healed all of his ulcers.   He states his heel ulcer have healed. He denies any barriers to exercise, he walks to mailbox 400 feet daily.   Pt. denies claudication in legs wen walking,.  Pt. denies rest pain; denies night pain denies non healing ulcers on lower extremites.  He denies history of stroke or TIA.  The patient denies New Medical or Surgical History  Pt Diabetic: Yes, in control, states rarely above 120 glucose Pt smoker: former smoker, quit in 1990  Pt meds include: Statin :Yes ASA: Yes Other anticoagulants/antiplatelets: takes coumadin for history of 2 heart valve replacements  Past Medical History  Diagnosis Date  . Erectile dysfunction   . Obesity   . OSA (obstructive sleep apnea)     not using CPAP  . History of nephrolithiasis   . HTN (hypertension)   . Aortal  stenosis 2006    biophrostetic  . Atrial fibrillation     onset after CABG--- [facility]  . S/P aortic valve replacement   . CHF (congestive heart failure)     abter CABG  . Pacemaker 2/07    VVI  . PVD (peripheral vascular disease)     CT angio showed- vascular insuff, intestine and RAS bilaterally, andgiogram 02/2009 severe PVD medical management  . Skin cancer     surgery (nose) 09-2010  . Sick sinus syndrome     [name]  . Hyperlipidemia   . Arthritis   . Gangrene   . Coronary artery disease   . DM type 2 (diabetes mellitus, type 2)     used to see Dr Meredith Pel, now f/u by Dr Larose Kells  . Memory loss     Social History History  Substance Use Topics  . Smoking status: Former Smoker    Types: Cigarettes    Quit date: 06/23/1988  . Smokeless tobacco: Former Systems developer  . Alcohol Use: No    Family History Family History  Problem Relation Age of Onset  . Heart attack Father 42  . Heart disease Father     Before age 6  . Hypertension Father   . Stroke Mother 12  . Cancer Sister     Cervical  . Heart attack Sister   . Heart disease Sister   . Colon cancer Neg Hx   . Prostate cancer Neg Hx   . Diabetes Sister  2 sister w/ DM  . Heart attack Sister   . Heart attack Sister   . Heart disease Sister 9    AAA  and  Stomach Aneurysm    Past Surgical History  Procedure Laterality Date  . Aortic valve replacement      biophrostetic for Ao Stenosis 2006  . Coronary artery bypass graft  2006  . Tonsillectomy    . Cataract extraction, bilateral    . Amputation  01/2010    great toe  . Pacemaker placement  07/2005    Medtronic Sigma SR implanted by Dr Olevia Perches  . Aortic valve replacement  01/17/2011    S/P redo median sternotomy, extracorporeal circulation, redo Aortic Valve Replacement using a 23-mm Edwards pericardial Magna-Ease valve, Dr Ulla Gallo  . Angioplasty / stenting femoral  02/10/10    Left femoral  . Incise and drain  abcess Right May 2014    right heel   . Eye surgery      Allergies  Allergen Reactions  . Metoprolol Succinate Other (See Comments)    Extremely tired  . Potassium-Containing Compounds     IV--loss of conciousness    Current Outpatient Prescriptions  Medication Sig Dispense Refill  . aspirin EC 81 MG tablet Take 81 mg by mouth daily.      Marland Kitchen donepezil (ARICEPT) 5 MG tablet take 1 tablet by mouth at bedtime  30 tablet  3  . ezetimibe-simvastatin (VYTORIN) 10-40 MG per tablet Take 1 tablet by mouth at bedtime.  30 tablet  3  . furosemide (LASIX) 80 MG tablet take 2 tablets by mouth once daily PLUS AN EXTRA 1 TABLET EVERY OTHER DAY AT 4:00 PM.  120 tablet  3  . insulin glargine (LANTUS) 100 UNIT/ML injection Inject 65-80 Units into the skin at bedtime.       . insulin glulisine (APIDRA) 100 UNIT/ML injection Inject 8-14 Units into the skin 3 (three) times daily with meals. Sliding scale as directed      . lisinopril (PRINIVIL,ZESTRIL) 5 MG tablet take 1 tablet by mouth once daily for high blood pressure  30 tablet  6  . metoprolol tartrate (LOPRESSOR) 25 MG tablet Take 12.5 mg by mouth 2 (two) times daily.      Marland Kitchen spironolactone (ALDACTONE) 25 MG tablet take 1 tablet by mouth once daily  30 tablet  6  . warfarin (COUMADIN) 5 MG tablet Take as directed by anticoagulation clinic  120 tablet  1   No current facility-administered medications for this visit.    ROS: See HPI for pertinent positives and negatives.   Physical Examination  Filed Vitals:   10/05/13 1241  BP: 116/73  Pulse: 67  Resp: 16   Filed Weights   10/05/13 1241  Weight: 231 lb (104.781 kg)   Body mass index is 34.61 kg/(m^2).   General: A&O x 3, WDWN, obese male. Gait: normalEyes: PERRLA. Pulmonary: CTAB, without wheezes , rales or rhonchi. Cardiac: regular Rythm , without detected murmur. Subcutaneous pacemaker left upper chest.        Carotid Bruits Left Right   Negative Negative  Aorta is not  palpable. Radial pulses: are 1+ palpable and =.                           VASCULAR EXAM: Extremities with ischemic changes : mild rubor in plantar surfaces of both feet. without Gangrene; without open wounds. Left great toe is surgically absent.  LE Pulses LEFT RIGHT       FEMORAL   palpable  not palpable        POPLITEAL  not palpable   not palpable       POSTERIOR TIBIAL  not palpable   not palpable        DORSALIS PEDIS      ANTERIOR TIBIAL not palpable  faintly palpable    Abdomen: soft, NT, no masses, large panus. Skin: no rashes, no ulcers noted. Musculoskeletal: no muscle wasting or atrophy.  Neurologic: A&O X 3; Appropriate Affect ; SENSATION: normal; MOTOR FUNCTION:  moving all extremities equally, motor strength 5/5 throughout. Speech is fluent/normal. CN 2-12 intact.    Non-Invasive Vascular Imaging: DATE: 10/05/2013 LOWER EXTREMITY ARTERIAL DUPLEX EVALUATION    INDICATION: Follow up lower extremity revascularizations    PREVIOUS INTERVENTION(S): Left superficial femoral artery stent on 02/10/10 & 11/23/12 Right anterior tibial artery angioplasty on 11/10/12    DUPLEX EXAM:     RIGHT  LEFT   Peak Systolic Velocity (cm/s) Ratio (if abnormal) Waveform  Peak Systolic Velocity (cm/s) Ratio (if abnormal) Waveform  92  B Common Femoral Artery 81  B  98  B Deep Femoral Artery 90  B  127  B Superficial Femoral Artery Proximal 67/72  B  72  B Superficial Femoral Artery Mid 107/161/140  B  61  B Superficial Femoral Artery Distal 96/57  B  43  B Popliteal Artery 2  B  Not Visualized   Posterior Tibial Artery Dist 21  B  83  M Anterior Tibial Artery Distal 14  M  25  M Peroneal Artery Distal 22  M  TBI = 0.36 Today's ABI / TBI TBI = 0.55  TBI = 0.37 Previous ABI / TBI (04/04/13 ) TBI = 0.54    Waveform:    M - Monophasic       B - Biphasic       T - Triphasic  If  Ankle Brachial Index (ABI) or Toe Brachial Index (TBI) performed, please see complete report     ADDITIONAL FINDINGS: See attached diagram for additional information.     IMPRESSION: 1. Doppler velocities suggest no focal hemodynamically significant stenosis of the bilateral femoral-popliteal arterial systems or the left superficial femoral artery stent. 2. The tibial artery waveforms correlate with the known bilateral tibial level arterial disease.    Compared to the previous exam:  No significant change noted when compared to the previous exam on 04/04/13.     ASSESSMENT: [name]. is a 79 y.o. male who is s/p left superficial femoral artery stent on 02/10/10 & 11/23/12, and right anterior tibial artery angioplasty on 11/10/12. Doppler velocities suggest no focal hemodynamically significant stenosis of the bilateral femoral-popliteal arterial systems or the left superficial femoral artery stent. The tibial artery waveforms correlate with the known bilateral tibial level arterial disease. No significant change noted when compared to the previous exam on 04/04/13. He does not have claudication symptoms nor non healing wounds, his heel ulcers resolved after the last revascularization procedure. His atherosclerotic risk factors are DM Type 2 in control, former smoker, obesity, and lack of regular physical activity.  PLAN:  I discussed in depth with the patient the nature of atherosclerosis, and emphasized the importance of maximal medical management including strict control of blood pressure, blood glucose, and lipid levels, obtaining regular exercise, and continued cessation of smoking.  The patient is aware that without maximal medical management the underlying  atherosclerotic disease process will progress, limiting the benefit of any interventions.  Based on the patient's vascular studies and examination, pt will return to clinic in 6 months for ABI's and bilateral LE arterial  Duplex.  The patient was given information about PAD including signs, symptoms, treatment, what symptoms should prompt the patient to seek immediate medical care, and risk reduction measures to take.  Clemon Chambers, RN, MSN, FNP-C [facility] Phone: [phone]  Clinic MD: Kellie Simmering on call  10/05/2013 1:01 PM

## 2013-10-05 NOTE — Patient Instructions (Signed)
Peripheral Vascular Disease Peripheral Vascular Disease (PVD), also called Peripheral Arterial Disease (PAD), is a circulation problem caused by cholesterol (atherosclerotic plaque) deposits in the arteries. PVD commonly occurs in the lower extremities (legs) but it can occur in other areas of the body, such as your arms. The cholesterol buildup in the arteries reduces blood flow which can cause pain and other serious problems. The presence of PVD can place a person at risk for Coronary Artery Disease (CAD).  CAUSES  Causes of PVD can be many. It is usually associated with more than one risk factor such as:   High Cholesterol.  Smoking.  Diabetes.  Lack of exercise or inactivity.  High blood pressure (hypertension).  Obesity.  Family history. SYMPTOMS   When the lower extremities are affected, patients with PVD may experience:  Leg pain with exertion or physical activity. This is called INTERMITTENT CLAUDICATION. This may present as cramping or numbness with physical activity. The location of the pain is associated with the level of blockage. For example, blockage at the abdominal level (distal abdominal aorta) may result in buttock or hip pain. Lower leg arterial blockage may result in calf pain.  As PVD becomes more severe, pain can develop with less physical activity.  In people with severe PVD, leg pain may occur at rest.  Other PVD signs and symptoms:  Leg numbness or weakness.  Coldness in the affected leg or foot, especially when compared to the other leg.  A change in leg color.  Patients with significant PVD are more prone to ulcers or sores on toes, feet or legs. These may take longer to heal or may reoccur. The ulcers or sores can become infected.  If signs and symptoms of PVD are ignored, gangrene may occur. This can result in the loss of toes or loss of an entire limb.  Not all leg pain is related to PVD. Other medical conditions can cause leg pain such  as:  Blood clots (embolism) or Deep Vein Thrombosis.  Inflammation of the blood vessels (vasculitis).  Spinal stenosis. DIAGNOSIS  Diagnosis of PVD can involve several different types of tests. These can include:  Pulse Volume Recording Method (PVR). This test is simple, painless and does not involve the use of X-rays. PVR involves measuring and comparing the blood pressure in the arms and legs. An ABI (Ankle-Brachial Index) is calculated. The normal ratio of blood pressures is 1. As this number becomes smaller, it indicates more severe disease.  < 0.95  indicates significant narrowing in one or more leg vessels.  <0.8 there will usually be pain in the foot, leg or buttock with exercise.  <0.4 will usually have pain in the legs at rest.  <0.25  usually indicates limb threatening PVD.  Doppler detection of pulses in the legs. This test is painless and checks to see if you have a pulses in your legs/feet.  A dye or contrast material (a substance that highlights the blood vessels so they show up on x-ray) may be given to help your caregiver better see the arteries for the following tests. The dye is eliminated from your body by the kidney's. Your caregiver may order blood work to check your kidney function and other laboratory values before the following tests are performed:  Magnetic Resonance Angiography (MRA). An MRA is a picture study of the blood vessels and arteries. The MRA machine uses a large magnet to produce images of the blood vessels.  Computed Tomography Angiography (CTA). A CTA is a   specialized x-ray that looks at how the blood flows in your blood vessels. An IV may be inserted into your arm so contrast dye can be injected.  Angiogram. Is a procedure that uses x-rays to look at your blood vessels. This procedure is minimally invasive, meaning a small incision (cut) is made in your groin. A small tube (catheter) is then inserted into the artery of your groin. The catheter is  guided to the blood vessel or artery your caregiver wants to examine. Contrast dye is injected into the catheter. X-rays are then taken of the blood vessel or artery. After the images are obtained, the catheter is taken out. TREATMENT  Treatment of PVD involves many interventions which may include:  Lifestyle changes:  Quitting smoking.  Exercise.  Following a low fat, low cholesterol diet.  Control of diabetes.  Foot care is very important to the PVD patient. Good foot care can help prevent infection.  Medication:  Cholesterol-lowering medicine.  Blood pressure medicine.  Anti-platelet drugs.  Certain medicines may reduce symptoms of Intermittent Claudication.  Interventional/Surgical options:  Angioplasty. An Angioplasty is a procedure that inflates a balloon in the blocked artery. This opens the blocked artery to improve blood flow.  Stent Implant. A wire mesh tube (stent) is placed in the artery. The stent expands and stays in place, allowing the artery to remain open.  Peripheral Bypass Surgery. This is a surgical procedure that reroutes the blood around a blocked artery to help improve blood flow. This type of procedure may be performed if Angioplasty or stent implants are not an option. SEEK IMMEDIATE MEDICAL CARE IF:   You develop pain or numbness in your arms or legs.  Your arm or leg turns cold, becomes blue in color.  You develop redness, warmth, swelling and pain in your arms or legs. MAKE SURE YOU:   Understand these instructions.  Will watch your condition.  Will get help right away if you are not doing well or get worse. Document Released: 07/17/2004 Document Revised: 09/01/2011 Document Reviewed: 06/13/2008 [employer] Patient Information 2014 [employer].  

## 2013-11-10 ENCOUNTER — Ambulatory Visit: Payer: Medicare Other | Admitting: Neurology

## 2013-11-23 ENCOUNTER — Encounter: Payer: Self-pay | Admitting: Internal Medicine

## 2013-11-23 ENCOUNTER — Telehealth: Payer: Self-pay | Admitting: *Deleted

## 2013-11-23 NOTE — Telephone Encounter (Signed)
Left message for patient - overdue for device check. Per last check, he is dependent on his pacemaker.  He was due for pacemaker check in February - did not come, scheduler tried calling patient in March with no response.    We will continue to try to reach patient and mail certified letter.

## 2013-11-25 ENCOUNTER — Encounter: Payer: Self-pay | Admitting: Internal Medicine

## 2013-11-25 DIAGNOSIS — Z950 Presence of cardiac pacemaker: Secondary | ICD-10-CM | POA: Diagnosis not present

## 2013-11-25 DIAGNOSIS — I4891 Unspecified atrial fibrillation: Secondary | ICD-10-CM | POA: Diagnosis not present

## 2013-12-06 ENCOUNTER — Telehealth: Payer: Self-pay | Admitting: Internal Medicine

## 2013-12-06 ENCOUNTER — Encounter: Payer: Self-pay | Admitting: Internal Medicine

## 2013-12-06 NOTE — Telephone Encounter (Signed)
CERTIFIED LETTER WAS RETURNED TO THE OFFICE UNSIGNED/MT

## 2013-12-16 ENCOUNTER — Ambulatory Visit: Payer: Medicare Other | Admitting: Neurology

## 2013-12-22 ENCOUNTER — Encounter: Payer: Self-pay | Admitting: Internal Medicine

## 2013-12-22 ENCOUNTER — Ambulatory Visit (INDEPENDENT_AMBULATORY_CARE_PROVIDER_SITE_OTHER): Payer: Medicare Other | Admitting: Pharmacist

## 2013-12-22 ENCOUNTER — Ambulatory Visit (INDEPENDENT_AMBULATORY_CARE_PROVIDER_SITE_OTHER): Payer: Medicare Other | Admitting: *Deleted

## 2013-12-22 ENCOUNTER — Ambulatory Visit (INDEPENDENT_AMBULATORY_CARE_PROVIDER_SITE_OTHER): Payer: Medicare Other | Admitting: Internal Medicine

## 2013-12-22 VITALS — BP 112/78 | HR 74 | Temp 97.2°F | Wt 229.5 lb

## 2013-12-22 DIAGNOSIS — I359 Nonrheumatic aortic valve disorder, unspecified: Secondary | ICD-10-CM | POA: Diagnosis not present

## 2013-12-22 DIAGNOSIS — E119 Type 2 diabetes mellitus without complications: Secondary | ICD-10-CM | POA: Diagnosis not present

## 2013-12-22 DIAGNOSIS — L03319 Cellulitis of trunk, unspecified: Secondary | ICD-10-CM

## 2013-12-22 DIAGNOSIS — Z950 Presence of cardiac pacemaker: Secondary | ICD-10-CM | POA: Diagnosis not present

## 2013-12-22 DIAGNOSIS — L02219 Cutaneous abscess of trunk, unspecified: Secondary | ICD-10-CM | POA: Diagnosis not present

## 2013-12-22 DIAGNOSIS — I10 Essential (primary) hypertension: Secondary | ICD-10-CM

## 2013-12-22 DIAGNOSIS — I2510 Atherosclerotic heart disease of native coronary artery without angina pectoris: Secondary | ICD-10-CM

## 2013-12-22 DIAGNOSIS — Z7901 Long term (current) use of anticoagulants: Secondary | ICD-10-CM | POA: Diagnosis not present

## 2013-12-22 DIAGNOSIS — I442 Atrioventricular block, complete: Secondary | ICD-10-CM

## 2013-12-22 DIAGNOSIS — Z5181 Encounter for therapeutic drug level monitoring: Secondary | ICD-10-CM | POA: Diagnosis not present

## 2013-12-22 LAB — MDC_IDC_ENUM_SESS_TYPE_INCLINIC
Battery Impedance: 2674 Ohm
Battery Voltage: 2.75 V
Brady Statistic RV Percent Paced: 99 %
Lead Channel Impedance Value: 703 Ohm
Lead Channel Pacing Threshold Amplitude: 1 V
MDC IDC MSMT LEADCHNL RV PACING THRESHOLD PULSEWIDTH: 0.6 ms
MDC IDC SET LEADCHNL RV PACING AMPLITUDE: 2.5 V
MDC IDC SET LEADCHNL RV PACING PULSEWIDTH: 0.6 ms
MDC IDC SET LEADCHNL RV SENSING SENSITIVITY: 2.8 mV

## 2013-12-22 LAB — POCT INR: INR: 3.2

## 2013-12-22 MED ORDER — DOXYCYCLINE HYCLATE 100 MG PO TABS: 100.0000 mg | ORAL_TABLET | Freq: Two times a day (BID) | ORAL | Status: DC

## 2013-12-22 NOTE — Progress Notes (Signed)
Overdue pacemaker check in clinic. Normal device function. Thresholds, sensing, impedances consistent with previous measurements. Device programmed to maximize longevity. 61 VHR---longest 5sec, fastest >400--marker channel appears as noise, no EGM. Device programmed at appropriate safety margins. Histogram distribution appropriate for patient activity level. Device programmed to optimize intrinsic conduction. Estimated longevity 33yrs. ROV w/ Dr. Rayann Heman in 88mo.

## 2013-12-22 NOTE — Progress Notes (Signed)
Pre visit review using our clinic review tool, if applicable. No additional management support is needed unless otherwise documented below in the visit note. 

## 2013-12-22 NOTE — Patient Instructions (Signed)
Please take all new medication as prescribed  Please continue all other medications as before, and refills have been done if requested.  Please have the pharmacy call with any other refills you may need.  Please continue your efforts at being more active, low cholesterol diet, and weight control.  Please keep your appointments with your specialists as you may have planned  Please call for referral to dermatology if not improved, or if worse with swelling and drainage you should call for general surgury referral

## 2013-12-22 NOTE — Progress Notes (Signed)
Subjective:    Patient ID: Seth Pacific., male    DOB: [birth year], 79 y.o.   MRN: [id number]  HPI Here with acute c/o mid right back area of red/tender and slight swelling, but no fever, drainage for 2-3 days.  No hx of MRSA but concerned given extensive PMH, s/p AVR.  No other skin erythema or pain.  Pt denies chest pain, increased sob or doe, wheezing, orthopnea, PND, increased LE swelling, palpitations, dizziness or syncope.  Pt denies new neurological symptoms such as new headache, or facial or extremity weakness or numbness   Pt denies polydipsia, polyuria Past Medical History  Diagnosis Date  . Erectile dysfunction   . Obesity   . OSA (obstructive sleep apnea)     not using CPAP  . History of nephrolithiasis   . HTN (hypertension)   . Aortal stenosis 2006    biophrostetic  . Atrial fibrillation     onset after CABG--- [facility]  . S/P aortic valve replacement   . CHF (congestive heart failure)     abter CABG  . Pacemaker 2/07    VVI  . PVD (peripheral vascular disease)     CT angio showed- vascular insuff, intestine and RAS bilaterally, andgiogram 02/2009 severe PVD medical management  . Skin cancer     surgery (nose) 09-2010  . Sick sinus syndrome     [name]  . Hyperlipidemia   . Arthritis   . Gangrene   . Coronary artery disease   . DM type 2 (diabetes mellitus, type 2)     used to see Dr Meredith Pel, now f/u by Dr Larose Kells  . Memory loss    Past Surgical History  Procedure Laterality Date  . Aortic valve replacement      biophrostetic for Ao Stenosis 2006  . Coronary artery bypass graft  2006  . Tonsillectomy    . Cataract extraction, bilateral    . Amputation  01/2010    great toe  . Pacemaker placement  07/2005    Medtronic Sigma SR implanted by Dr Olevia Perches  . Aortic valve replacement  01/17/2011    S/P redo median sternotomy, extracorporeal circulation, redo Aortic Valve Replacement using a 23-mm Edwards pericardial  Magna-Ease valve, Dr Ulla Gallo  . Angioplasty / stenting femoral  02/10/10    Left femoral  . Incise and drain abcess Right May 2014    right heel   . Eye surgery      reports that he quit smoking about 25 years ago. His smoking use included Cigarettes. He smoked 0.00 packs per day. He has quit using smokeless tobacco. He reports that he does not drink alcohol or use illicit drugs. family history includes Cancer in his sister; Diabetes in his sister; Heart attack in his sister, sister, and sister; Heart attack (age of onset: 42) in his father; Heart disease in his father and sister; Heart disease (age of onset: 43) in his sister; Hypertension in his father; Stroke (age of onset: 29) in his mother. There is no history of Colon cancer or Prostate cancer. Allergies  Allergen Reactions  . Metoprolol Succinate Other (See Comments)    Extremely tired  . Potassium-Containing Compounds     IV--loss of conciousness   Current Outpatient Prescriptions on File Prior to Visit  Medication Sig Dispense Refill  . aspirin EC 81 MG tablet Take 81 mg by mouth daily.      Marland Kitchen donepezil (ARICEPT) 5 MG tablet take  1 tablet by mouth at bedtime  30 tablet  3  . ezetimibe-simvastatin (VYTORIN) 10-40 MG per tablet Take 1 tablet by mouth at bedtime.  30 tablet  3  . furosemide (LASIX) 80 MG tablet take 2 tablets by mouth once daily PLUS AN EXTRA 1 TABLET EVERY OTHER DAY AT 4:00 PM.  120 tablet  3  . insulin glargine (LANTUS) 100 UNIT/ML injection Inject 65-80 Units into the skin at bedtime.       . insulin glulisine (APIDRA) 100 UNIT/ML injection Inject 8-14 Units into the skin 3 (three) times daily with meals. Sliding scale as directed      . lisinopril (PRINIVIL,ZESTRIL) 5 MG tablet take 1 tablet by mouth once daily for high blood pressure  30 tablet  6  . metoprolol tartrate (LOPRESSOR) 25 MG tablet Take 12.5 mg by mouth 2 (two) times daily.      Marland Kitchen spironolactone (ALDACTONE) 25 MG tablet take 1 tablet by mouth  once daily  30 tablet  6  . warfarin (COUMADIN) 5 MG tablet Take as directed by anticoagulation clinic  120 tablet  1   No current facility-administered medications on file prior to visit.   Review of Systems  Constitutional: Negative for unusual diaphoresis or other sweats  HENT: Negative for ringing in ear Eyes: Negative for double vision or worsening visual disturbance.  Respiratory: Negative for choking and stridor.   Gastrointestinal: Negative for vomiting or other signifcant bowel change Genitourinary: Negative for hematuria or decreased urine volume.  Musculoskeletal: Negative for other MSK pain or swelling Skin: Negative for color change and worsening wound.  Neurological: Negative for tremors and numbness other than noted  Psychiatric/Behavioral: Negative for decreased concentration or agitation other than above       Objective:   Physical Exam BP 112/78  Pulse 74  Temp(Src) 97.2 F (36.2 C) (Oral)  Wt 229 lb 8 oz (104.101 kg)  SpO2 94% VS noted,  Constitutional: Pt appears well-developed, well-nourished.  HENT: Head: NCAT.  Right Ear: External ear normal.  Left Ear: External ear normal.  Eyes: . Pupils are equal, round, and reactive to light. Conjunctivae and EOM are normal Neck: Normal range of motion. Neck supple.  Cardiovascular: Normal rate and regular rhythm.   Pulmonary/Chest: Effort normal and breath sounds normal.  Neurological: Pt is alert. Not confused , motor grossly intact Skin: right mid back approx t4 and 1.5 cm lateral to spinous process - 1 cm area red/tender slightly raised/swelling without fluctuance or drainage Psychiatric: Pt behavior is normal. No agitation.     Assessment & Plan:

## 2013-12-23 DIAGNOSIS — L0390 Cellulitis, unspecified: Secondary | ICD-10-CM | POA: Insufficient documentation

## 2013-12-23 NOTE — Assessment & Plan Note (Signed)
Cellulitis vs sebaceous cyst infection - for doxycycline course, consider derm if not improved or gen surg if onset drainage

## 2013-12-23 NOTE — Assessment & Plan Note (Signed)
stable overall by history and exam, recent data reviewed with pt, and pt to continue medical treatment as before,  to f/u any worsening symptoms or concerns Lab Results  Component Value Date   HGBA1C 12.4* 08/04/2012   For f/u lab with PCP, cont to monitor cbg's

## 2013-12-23 NOTE — Assessment & Plan Note (Signed)
stable overall by history and exam, recent data reviewed with pt, and pt to continue medical treatment as before,  to f/u any worsening symptoms or concerns BP Readings from Last 3 Encounters:  12/22/13 112/78  10/05/13 116/73  07/18/13 138/64

## 2014-01-05 ENCOUNTER — Encounter: Payer: Self-pay | Admitting: Neurology

## 2014-01-05 ENCOUNTER — Ambulatory Visit (INDEPENDENT_AMBULATORY_CARE_PROVIDER_SITE_OTHER): Payer: Medicare Other | Admitting: Neurology

## 2014-01-05 VITALS — BP 146/66 | HR 83 | Wt 234.0 lb

## 2014-01-05 DIAGNOSIS — I2510 Atherosclerotic heart disease of native coronary artery without angina pectoris: Secondary | ICD-10-CM | POA: Diagnosis not present

## 2014-01-05 DIAGNOSIS — R413 Other amnesia: Secondary | ICD-10-CM | POA: Diagnosis not present

## 2014-01-05 MED ORDER — DONEPEZIL HCL 10 MG PO TABS: 10.0000 mg | ORAL_TABLET | Freq: Every day | ORAL | Status: DC

## 2014-01-05 NOTE — Progress Notes (Signed)
Reason for visit: Memory disorder  [name]. is an 79 y.o. male  History of present illness:  [name] is a 79 year old right-handed white male with a history of a progressive memory disturbance. The patient has been on low-dose Aricept, and he is tolerating the medication fairly well. The patient has had ongoing progression of his memory, and he requires assistance with keeping up with his medications. He has not been operating motor vehicle over the last 3 months. The wife has noted some increase in irritability at times. The patient is somewhat different with his personality, not as calm and relaxed as he used to be. He returns this office for an evaluation.  Past Medical History  Diagnosis Date  . Erectile dysfunction   . Obesity   . OSA (obstructive sleep apnea)     not using CPAP  . History of nephrolithiasis   . HTN (hypertension)   . Aortal stenosis 2006    biophrostetic  . Atrial fibrillation     onset after CABG--- [facility]mad[facility] management [facility]  . S/P aortic valve replacement   . CHF (congestive heart failure)     abter CABG  . Pacemaker 2/07    VVI  . PVD (peripheral vascular disease)     CT angio showed- vascular insuff, intestine and RAS bilaterally, andgiogram 02/2009 severe PVD medical management  . Skin cancer     surgery (nose) 09-2010  . Sick sinus syndrome     [name]  . Hyperlipidemia   . Arthritis   . Gangrene   . Coronary artery disease   . DM type 2 (diabetes mellitus, type 2)     used to see Dr [name], now f/u by Dr [name]  . Memory loss     Past Surgical History  Procedure Laterality Date  . Aortic valve replacement      biophrostetic for Ao Stenosis 2006  . Coronary artery bypass graft  2006  . Tonsillectomy    . Cataract extraction, bilateral    . Amputation  01/2010    great toe  . Pacemaker placement  07/2005    Medtronic Sigma SR implanted by Dr [name]  . Aortic valve replacement  01/17/2011    S/P redo  median sternotomy, extracorporeal circulation, redo Aortic Valve Replacement using a 23-mm Edwards pericardial Magna-Ease valve, Dr [name]  . Angioplasty / stenting femoral  02/10/10    Left femoral  . Incise and drain abcess Right May 2014    right heel   . Eye surgery      Family History  Problem Relation Age of Onset  . Heart attack Father 44  . Heart disease Father     Before age 20  . Hypertension Father   . Stroke Mother 31  . Cancer Sister     Cervical  . Heart attack Sister   . Heart disease Sister   . Colon cancer Neg Hx   . Prostate cancer Neg Hx   . Diabetes Sister     2 sister w/ DM  . Heart attack Sister   . Heart attack Sister   . Heart disease Sister 28    AAA  and  Stomach Aneurysm    Social history:  reports that he quit smoking about 25 years ago. His smoking use included Cigarettes. He smoked 0.00 packs per day. He has quit using smokeless tobacco. He reports that he does not drink alcohol or use illicit drugs.  Allergies  Allergen Reactions  . Metoprolol Succinate Other (See Comments)    Extremely tired  . Potassium-Containing Compounds     IV--loss of conciousness    Medications:  Current Outpatient Prescriptions on File Prior to Visit  Medication Sig Dispense Refill  . aspirin EC 81 MG tablet Take 81 mg by mouth daily.      Marland Kitchen ezetimibe-simvastatin (VYTORIN) 10-40 MG per tablet Take 1 tablet by mouth at bedtime.  30 tablet  3  . furosemide (LASIX) 80 MG tablet take 2 tablets by mouth once daily PLUS AN EXTRA 1 TABLET EVERY OTHER DAY AT 4:00 PM.  120 tablet  3  . insulin glargine (LANTUS) 100 UNIT/ML injection Inject 65-80 Units into the skin at bedtime.       . insulin glulisine (APIDRA) 100 UNIT/ML injection Inject 8-14 Units into the skin 3 (three) times daily with meals. Sliding scale as directed      . lisinopril (PRINIVIL,ZESTRIL) 5 MG tablet take 1 tablet by mouth once daily for high blood pressure  30 tablet  6  . metoprolol  tartrate (LOPRESSOR) 25 MG tablet Take 12.5 mg by mouth 2 (two) times daily.      Marland Kitchen spironolactone (ALDACTONE) 25 MG tablet take 1 tablet by mouth once daily  30 tablet  6  . warfarin (COUMADIN) 5 MG tablet Take as directed by anticoagulation clinic  120 tablet  1   No current facility-administered medications on file prior to visit.    ROS:  Out of a complete 14 system review of symptoms, the patient complains only of the following symptoms, and all other reviewed systems are negative.  Fatigue Hearing loss Loss of vision Shortness of breath Leg swelling Insomnia Frequency of urination Joint swelling, muscle cramps Moles Memory loss Agitation, confusion, decreased concentration  Blood pressure 146/66, pulse 83, weight 234 lb (106.142 kg).  Physical Exam  General: The patient is alert and cooperative at the time of the examination. The patient is markedly obese.  Skin: 2+ edema of ankles is noted bilaterally.   Neurologic Exam  Mental status: The Mini-Mental status examination done today shows a total score of 21/30.  Cranial nerves: Facial symmetry is present. Speech is normal, no aphasia or dysarthria is noted. Extraocular movements are full. Visual fields are full.  Motor: The patient has good strength in all 4 extremities.  Sensory examination: Soft touch sensation is symmetric on the face, arms, and legs.  Coordination: The patient has good finger-nose-finger and heel-to-shin bilaterally.  Gait and station: The patient has a normal gait. Tandem gait is slightly unsteady. Romberg is negative. No drift is seen.  Reflexes: Deep tendon reflexes are symmetric, but are depressed.   Assessment/Plan:  1. Progressive memory disturbance  The patient will be placed on Aricept at the 10 mg dose. The patient may require other medication such as Lexapro if agitation and irritability continues. He will followup in 6 months. The patient has had some progression of his  memory.  Seth Alexanders MD 01/05/2014 5:46 PM  [facility] [address] [place], [place] [address]  Phone [phone] Fax [phone]

## 2014-01-18 ENCOUNTER — Other Ambulatory Visit (HOSPITAL_COMMUNITY): Payer: Self-pay | Admitting: Cardiology

## 2014-01-18 DIAGNOSIS — I6529 Occlusion and stenosis of unspecified carotid artery: Secondary | ICD-10-CM

## 2014-01-19 ENCOUNTER — Encounter (HOSPITAL_COMMUNITY): Payer: Medicare Other

## 2014-01-20 ENCOUNTER — Other Ambulatory Visit: Payer: Self-pay | Admitting: *Deleted

## 2014-01-20 ENCOUNTER — Telehealth: Payer: Self-pay | Admitting: *Deleted

## 2014-01-20 MED ORDER — FUROSEMIDE 80 MG PO TABS: ORAL_TABLET | ORAL | Status: DC

## 2014-01-20 NOTE — Telephone Encounter (Signed)
Is this patient still to be taking the furosemide? Please advise. Thanks, MI

## 2014-01-20 NOTE — Telephone Encounter (Signed)
Lasix is listed on his medication list when he saw Dr Aundra Dubin in January 2015.

## 2014-02-01 ENCOUNTER — Encounter (HOSPITAL_COMMUNITY): Payer: Medicare Other

## 2014-02-10 ENCOUNTER — Ambulatory Visit (INDEPENDENT_AMBULATORY_CARE_PROVIDER_SITE_OTHER): Payer: Medicare Other

## 2014-02-10 ENCOUNTER — Ambulatory Visit (HOSPITAL_COMMUNITY): Payer: Medicare Other | Attending: Cardiology | Admitting: Cardiology

## 2014-02-10 DIAGNOSIS — Z7901 Long term (current) use of anticoagulants: Secondary | ICD-10-CM | POA: Diagnosis not present

## 2014-02-10 DIAGNOSIS — Z5181 Encounter for therapeutic drug level monitoring: Secondary | ICD-10-CM

## 2014-02-10 DIAGNOSIS — I359 Nonrheumatic aortic valve disorder, unspecified: Secondary | ICD-10-CM | POA: Diagnosis not present

## 2014-02-10 DIAGNOSIS — I6529 Occlusion and stenosis of unspecified carotid artery: Secondary | ICD-10-CM

## 2014-02-10 LAB — POCT INR: INR: 1.5

## 2014-02-10 NOTE — Progress Notes (Signed)
Carotid duplex

## 2014-02-22 ENCOUNTER — Ambulatory Visit (INDEPENDENT_AMBULATORY_CARE_PROVIDER_SITE_OTHER): Payer: Medicare Other | Admitting: Physician Assistant

## 2014-02-22 ENCOUNTER — Encounter: Payer: Medicare Other | Admitting: *Deleted

## 2014-02-22 ENCOUNTER — Ambulatory Visit (INDEPENDENT_AMBULATORY_CARE_PROVIDER_SITE_OTHER): Payer: Medicare Other | Admitting: *Deleted

## 2014-02-22 ENCOUNTER — Encounter: Payer: Self-pay | Admitting: Physician Assistant

## 2014-02-22 VITALS — BP 140/60 | HR 71 | Ht 68.5 in | Wt 233.0 lb

## 2014-02-22 DIAGNOSIS — E119 Type 2 diabetes mellitus without complications: Secondary | ICD-10-CM | POA: Diagnosis not present

## 2014-02-22 DIAGNOSIS — Z952 Presence of prosthetic heart valve: Secondary | ICD-10-CM

## 2014-02-22 DIAGNOSIS — I5032 Chronic diastolic (congestive) heart failure: Secondary | ICD-10-CM

## 2014-02-22 DIAGNOSIS — I10 Essential (primary) hypertension: Secondary | ICD-10-CM | POA: Diagnosis not present

## 2014-02-22 DIAGNOSIS — E785 Hyperlipidemia, unspecified: Secondary | ICD-10-CM

## 2014-02-22 DIAGNOSIS — I359 Nonrheumatic aortic valve disorder, unspecified: Secondary | ICD-10-CM | POA: Diagnosis not present

## 2014-02-22 DIAGNOSIS — I4891 Unspecified atrial fibrillation: Secondary | ICD-10-CM

## 2014-02-22 DIAGNOSIS — I2510 Atherosclerotic heart disease of native coronary artery without angina pectoris: Secondary | ICD-10-CM

## 2014-02-22 DIAGNOSIS — Z954 Presence of other heart-valve replacement: Secondary | ICD-10-CM

## 2014-02-22 DIAGNOSIS — Z5181 Encounter for therapeutic drug level monitoring: Secondary | ICD-10-CM

## 2014-02-22 DIAGNOSIS — Z7901 Long term (current) use of anticoagulants: Secondary | ICD-10-CM | POA: Diagnosis not present

## 2014-02-22 DIAGNOSIS — I4821 Permanent atrial fibrillation: Secondary | ICD-10-CM

## 2014-02-22 DIAGNOSIS — Z950 Presence of cardiac pacemaker: Secondary | ICD-10-CM

## 2014-02-22 LAB — BASIC METABOLIC PANEL
BUN: 16 mg/dL (ref 6–23)
CALCIUM: 9.8 mg/dL (ref 8.4–10.5)
CO2: 28 mEq/L (ref 19–32)
Chloride: 99 mEq/L (ref 96–112)
Creatinine, Ser: 1.1 mg/dL (ref 0.4–1.5)
GFR: 70.52 mL/min (ref >60.00)
Glucose, Bld: 367 mg/dL — ABNORMAL HIGH (ref 70–99)
Potassium: 3.9 mEq/L (ref 3.5–5.1)
SODIUM: 133 meq/L — AB (ref 135–145)

## 2014-02-22 LAB — HEMOGLOBIN A1C: HEMOGLOBIN A1C: 12.4 % — AB (ref 4.6–6.5)

## 2014-02-22 LAB — POCT INR: INR: 2.4

## 2014-02-22 MED ORDER — LISINOPRIL 5 MG PO TABS: ORAL_TABLET | ORAL | Status: DC

## 2014-02-22 MED ORDER — FUROSEMIDE 80 MG PO TABS: ORAL_TABLET | ORAL | Status: DC

## 2014-02-22 NOTE — Progress Notes (Signed)
Cardiology Office Note    Date:  02/22/2014   ID:  [name]., DOB 02/24/1935, MRN [id number]  PCP:  [name], MD  Cardiologist:  Dr. Loralie Champagne   Electrophysiologist:  Dr. Thompson Grayer    History of Present Illness: Ismar Yabut. is a 79 y.o. male with a hx of AS s/p redo AVR, diastolic CHF, CAD, chronic atrial fibrillation, s/p pacemaker. Patient had redo AVR with #23 Edwards pericardial valve in 7/12. Cath prior to surgery showed stable disease with patent grafts. He follows with [facility] for PAD.  He is s/p PTCA to the right anterior tibial artery in 5/14 and PCI to the left SFA in 6/14.    Last seen by Dr. Loralie Champagne in 06/2013.  He returns for FU.  Overall, he is fairly sedentary. He denies significant dyspnea. He denies orthopnea, PND. LE edema is stable without significant change. He denies syncope.   Studies:  - Echo (2/13):  Mod LVH, EF 55-60%, no RWMA, AVR ok (mean 14 mmHg), MAC, mild MS, mild MR, mod LAE, mild to mod reduced RVF, mild RAE, PASP 61 mmHg  - Carotid US (8/15):  Bilateral ICA 40-59% >>> FU 1 year   Recent Labs/Images: 07/18/2013: Creatinine 1.3; HDL Cholesterol by NMR 32.30*; Hemoglobin 14.0; LDL (calc) 95; Potassium 3.9    Wt Readings from Last 3 Encounters:  02/22/14 233 lb (105.688 kg)  01/05/14 234 lb (106.142 kg)  12/22/13 229 lb 8 oz (104.101 kg)     Past Medical History  Diagnosis Date  . Erectile dysfunction   . Obesity   . OSA (obstructive sleep apnea)     not using CPAP  . History of nephrolithiasis   . HTN (hypertension)   . Aortal stenosis 2006    biophrostetic  . Atrial fibrillation     onset after CABG--- [facility]mad[facility] management [facility]  . S/P aortic valve replacement   . CHF (congestive heart failure)     abter CABG  . Pacemaker 2/07    VVI  . PVD (peripheral vascular disease)     CT angio showed- vascular insuff, intestine and RAS bilaterally, andgiogram 02/2009 severe PVD medical management  . Skin  cancer     surgery (nose) 09-2010  . Sick sinus syndrome     [name]  . Hyperlipidemia   . Arthritis   . Gangrene   . Coronary artery disease   . DM type 2 (diabetes mellitus, type 2)     used to see Dr Meredith Pel, now f/u by Dr Larose Kells  . Memory loss   1. Aortic stenosis: s/p bioprosthetic aortic valve replacement in 06/2004. Echo in 7/11 showed mean gradient 35 mmHg across the bioprosthetic valve. Echo in 6/12 showed worsening of the bioprosthetic mean gradient to 46 mmHg. TEE in 7/12 showed only 1 cusp of the bioprosthetic aortic valve moving (heavily calcified), mean 52 mmHg/peak gradient 76 mmHg. Patient had redo AVR (7/12) with #23 Edwards pericardial valve. Echo (8/12) post AVR showed EF 55%, inferior/inferoseptal HK, bioprosthetic aortic valve with mean gradient 16/peak 28, mild MR, moderately dilated RV with mildly decreased RV systolic function, PASP 68 mmHg. Echo (2/13) with EF 55-60%, mild mitral stenosis and mild MR, bioprosthetic aortic valve with mean gradient 14 mmHg, mildly dilated RV with mild to moderate systolic dysfunction, PA systolic pressure 61 mmHg.  2. CAD: CABG with AVR in 1/06. Patient had SVG-OM and SVG-D. LHC (7/12) showed 50% distal RCA lesion extending  into PLV and PDA, total occlusion CFX, 50% pLAD, 90% ostial D1, patent SVG-D1 and SVG-OM.  3. Diabetes  4. Hyperlipidemia  5. HTN  6. Sick sinus syndrome with Medtronic dual chamber PCM  7. Obesity  8. OSA: Not using CPAP (cannot tolerate)  9. Chronic diastolic CHF: Echo in 9/83 showed EF 60-65% with moderate LV hypertrophy, moderate diastolic dysfunction, mild mitral stenosis, bioprosthetic aortic valve with mean gradient 35 mmHg (moderate stenosis) and PA systolic pressure 69 mmHg. Echo (6/12) showed EF 60-65%, moderate LV hypertrophy, bioprosthetic aortic valve with mean gradient 46 mmHg (severe stenosis) and PA systolic pressure 74 mmHg, mild MR.  10. Nephrolithiasis  11. PAD: Has seen [facility].  Lower extremity angiography in 9/10 showed occlusion of all 3 vessels below the knees in both legs. Patient then had L SFA stent and PTCA left AT by Dr. Trula Slade in 2011. He had amputation of the left great toe in 8/11. 5/14 PTCA to right anterior tibial artery. 6/14 PCI to left SFA.  12. Atrial fibrillation: Chronic, on coumadin. LAA thrombus noted on TEE in 7/12.  13. Carotid stenosis: carotid dopplers (7/11) with 40-59% bilateral ICA stenosis. Carotids (6/12) with 40-59% bilateral ICA stenosis. Carotids (7/13) with 40-59% bilateral ICA stenosis. Carotids (7/14) with 40-59% bilateral ICA stenosis.  14. Beta blocker intolerance.  15. Dementia   Current Outpatient Prescriptions  Medication Sig Dispense Refill  . aspirin EC 81 MG tablet Take 81 mg by mouth daily.      Marland Kitchen donepezil (ARICEPT) 10 MG tablet Take 1 tablet (10 mg total) by mouth at bedtime.  30 tablet  5  . ezetimibe-simvastatin (VYTORIN) 10-40 MG per tablet Take 1 tablet by mouth at bedtime.  30 tablet  3  . furosemide (LASIX) 80 MG tablet Take 80 qam and every other day 40 qpm  120 tablet  0  . insulin glargine (LANTUS) 100 UNIT/ML injection Inject 65-80 Units into the skin at bedtime.       . insulin glulisine (APIDRA) 100 UNIT/ML injection Inject 8-14 Units into the skin 3 (three) times daily with meals. Sliding scale as directed      . lisinopril (PRINIVIL,ZESTRIL) 5 MG tablet take 1 tablet by mouth once daily for high blood pressure  30 tablet  6  . spironolactone (ALDACTONE) 25 MG tablet take 1 tablet by mouth once daily  30 tablet  6  . warfarin (COUMADIN) 5 MG tablet Take as directed by anticoagulation clinic  120 tablet  1   No current facility-administered medications for this visit.     Allergies:   Metoprolol succinate and Potassium-containing compounds   Social History:  The patient  reports that he quit smoking about 25 years ago. His smoking use included Cigarettes. He smoked 0.00 packs per day. He has quit using  smokeless tobacco. He reports that he does not drink alcohol or use illicit drugs.   Family History:  The patient's family history includes Cancer in his sister; Diabetes in his sister; Heart attack in his sister, sister, and sister; Heart attack (age of onset: 19) in his father; Heart disease in his father and sister; Heart disease (age of onset: 7) in his sister; Hypertension in his father; Stroke (age of onset: 16) in his mother. There is no history of Colon cancer or Prostate cancer.   ROS:  Please see the history of present illness.      All other systems reviewed and negative.   PHYSICAL EXAM: VS:  BP 140/60  Pulse 71  Ht 5' 8.5" (1.74 m)  Wt 233 lb (105.688 kg)  BMI 34.91 kg/m2 Well nourished, well developed, in no acute distress HEENT: normal Neck: no JVD Cardiac:  normal S1, S2; RRR; no murmur Lungs:  clear to auscultation bilaterally, no wheezing, rhonchi or rales Abd: soft, nontender, no hepatomegaly Ext: trace to 1+ bilateral LE edema Skin: warm and dry Neuro:  CNs 2-12 intact, no focal abnormalities noted  EKG:  Ventricular paced, HR 71     ASSESSMENT AND PLAN:  1. Coronary artery disease: No angina. Continue current regimen which includes aspirin, Vytorin, ACE inhibitor. 2. Chronic diastolic heart failure: Volume remains stable. Continue current dose of furosemide and spironolactone. Check a followup basic metabolic panel today. 3. Permanent atrial fibrillation:  Rate controlled. He remains on Coumadin. 4. S/P AVR: Continue SBE prophylaxis. Prosthetic valve functioned normally at last echocardiogram. 5. Unspecified essential hypertension: Controlled. 6. HYPERLIPIDEMIA: Continue statin. 7. Type 2 diabetes mellitus without complication - patient requests hemoglobin A1c be drawn today. This will be sent to his primary care physician. 8. PACEMAKER, PERMANENT: Follow up with EP.   Disposition:  Follow up with Dr. Aundra Dubin in 6 months.   Signed, Versie Starks,  MHS 02/22/2014 2:45 PM    [facility] [place], [place], [place]  [address] Phone: [phone]; Fax: [phone]

## 2014-02-22 NOTE — Patient Instructions (Signed)
REFILLS FOR LASIX AND LISINOPRIL SENT IN  LAB WORK TODAY;  BMET, A1C   Your physician wants you to follow-up in: [place] DR. Aundra Dubin You will receive a reminder letter in the mail two months in advance. If you don't receive a letter, please call our office to schedule the follow-up appointment.

## 2014-02-23 NOTE — Telephone Encounter (Signed)
error 

## 2014-02-24 ENCOUNTER — Telehealth: Payer: Self-pay | Admitting: Internal Medicine

## 2014-02-24 ENCOUNTER — Telehealth: Payer: Self-pay | Admitting: *Deleted

## 2014-02-24 DIAGNOSIS — E119 Type 2 diabetes mellitus without complications: Secondary | ICD-10-CM

## 2014-02-24 NOTE — Telephone Encounter (Signed)
Please arrange a endocrinology referral and let the patient know

## 2014-02-24 NOTE — Telephone Encounter (Signed)
Spoke with Pts wife, she stated Pt was still asleep, but would have him call me back.

## 2014-02-24 NOTE — Telephone Encounter (Signed)
[name]  spoke with Pt wife and informed them that a referral to Endo was placed and they will be receiving a call to schedule appt.

## 2014-02-24 NOTE — Telephone Encounter (Signed)
Referral placed to endocrinology.

## 2014-02-24 NOTE — Telephone Encounter (Signed)
pt's wife notified about lab results and that Dr. Larose Kells is referring pt to Endocrinology due to Hgb A1c 12.4. Wife verbalized Plan of Care

## 2014-03-06 ENCOUNTER — Ambulatory Visit: Payer: Medicare Other | Admitting: Endocrinology

## 2014-04-03 ENCOUNTER — Other Ambulatory Visit: Payer: Self-pay | Admitting: *Deleted

## 2014-04-03 DIAGNOSIS — Z48812 Encounter for surgical aftercare following surgery on the circulatory system: Secondary | ICD-10-CM

## 2014-04-03 DIAGNOSIS — I739 Peripheral vascular disease, unspecified: Secondary | ICD-10-CM

## 2014-04-17 ENCOUNTER — Other Ambulatory Visit (HOSPITAL_COMMUNITY): Payer: Medicare Other

## 2014-04-17 ENCOUNTER — Encounter (HOSPITAL_COMMUNITY): Payer: Medicare Other

## 2014-04-17 ENCOUNTER — Ambulatory Visit: Payer: Medicare Other | Admitting: Family

## 2014-04-26 ENCOUNTER — Other Ambulatory Visit: Payer: Self-pay | Admitting: Cardiology

## 2014-04-26 MED ORDER — SPIRONOLACTONE 25 MG PO TABS: ORAL_TABLET | ORAL | Status: DC

## 2014-05-04 ENCOUNTER — Telehealth: Payer: Self-pay | Admitting: *Deleted

## 2014-05-04 DIAGNOSIS — F0391 Unspecified dementia with behavioral disturbance: Secondary | ICD-10-CM

## 2014-05-04 DIAGNOSIS — F03918 Unspecified dementia, unspecified severity, with other behavioral disturbance: Secondary | ICD-10-CM

## 2014-05-04 NOTE — Telephone Encounter (Signed)
I called the daughter. The patient is becoming more withdrawn, not paying attention to personal hygiene. The patient is having difficulty interacting with his wife, not wanting to take his medications or take a bath. I will try to get some aid and assistance to the house if possible.

## 2014-05-04 NOTE — Telephone Encounter (Signed)
Daughter is calling really concerned regarding her father's mental capabilities. As time process the more he fails to take care of his personal hygiene. When his spouse requests him to bathe their is a huge argument. The daughter if Dr. Jannifer Franklin cant refer a home care visit a few times a week. The cannot take him out on any social functions with the familly because of these issues. Please call. Daughter Malachy Mood or Wife Vaughan Basta.

## 2014-05-09 NOTE — Telephone Encounter (Signed)
I have sent a message to [facility] to see if there is any way to get aid and assistance in the home.

## 2014-05-10 ENCOUNTER — Encounter: Payer: Self-pay | Admitting: Neurology

## 2014-05-12 ENCOUNTER — Encounter: Payer: Self-pay | Admitting: Family

## 2014-05-15 ENCOUNTER — Ambulatory Visit (INDEPENDENT_AMBULATORY_CARE_PROVIDER_SITE_OTHER)
Admission: RE | Admit: 2014-05-15 | Discharge: 2014-05-15 | Disposition: A | Discharge status: Home / Self Care | Payer: Medicare Other | Source: Ambulatory Visit | Attending: Surgery | Admitting: Surgery

## 2014-05-15 ENCOUNTER — Encounter: Payer: Self-pay | Admitting: Family

## 2014-05-15 ENCOUNTER — Ambulatory Visit (INDEPENDENT_AMBULATORY_CARE_PROVIDER_SITE_OTHER): Payer: Medicare Other | Admitting: Pharmacist

## 2014-05-15 ENCOUNTER — Ambulatory Visit (INDEPENDENT_AMBULATORY_CARE_PROVIDER_SITE_OTHER): Payer: Medicare Other | Admitting: Family

## 2014-05-15 ENCOUNTER — Ambulatory Visit (HOSPITAL_COMMUNITY)
Admission: RE | Admit: 2014-05-15 | Discharge: 2014-05-15 | Disposition: A | Discharge status: Home / Self Care | Payer: Medicare Other | Source: Ambulatory Visit | Attending: Family | Admitting: Family

## 2014-05-15 VITALS — BP 121/57 | HR 65 | Resp 16 | Ht 69.0 in | Wt 232.0 lb

## 2014-05-15 DIAGNOSIS — E119 Type 2 diabetes mellitus without complications: Secondary | ICD-10-CM | POA: Insufficient documentation

## 2014-05-15 DIAGNOSIS — I739 Peripheral vascular disease, unspecified: Secondary | ICD-10-CM | POA: Diagnosis not present

## 2014-05-15 DIAGNOSIS — E785 Hyperlipidemia, unspecified: Secondary | ICD-10-CM | POA: Insufficient documentation

## 2014-05-15 DIAGNOSIS — Z7901 Long term (current) use of anticoagulants: Secondary | ICD-10-CM | POA: Diagnosis not present

## 2014-05-15 DIAGNOSIS — I10 Essential (primary) hypertension: Secondary | ICD-10-CM | POA: Diagnosis not present

## 2014-05-15 DIAGNOSIS — I359 Nonrheumatic aortic valve disorder, unspecified: Secondary | ICD-10-CM

## 2014-05-15 DIAGNOSIS — Z23 Encounter for immunization: Secondary | ICD-10-CM | POA: Diagnosis not present

## 2014-05-15 DIAGNOSIS — I2510 Atherosclerotic heart disease of native coronary artery without angina pectoris: Secondary | ICD-10-CM | POA: Diagnosis not present

## 2014-05-15 DIAGNOSIS — Z5181 Encounter for therapeutic drug level monitoring: Secondary | ICD-10-CM

## 2014-05-15 DIAGNOSIS — Z48812 Encounter for surgical aftercare following surgery on the circulatory system: Secondary | ICD-10-CM

## 2014-05-15 LAB — POCT INR: INR: 2.8

## 2014-05-15 NOTE — Patient Instructions (Signed)

## 2014-05-15 NOTE — Progress Notes (Signed)
[facility] [place] HISTORY AND PHYSICAL -PAD  History of Present Illness [name]. is a 79 y.o. male patient of Dr. Trula Slade. The patient was seen earlier this year with a right heel ulcer x2 as well as a breakdown of the toe amputation site at the left great toe. He has previously undergone left superficial femoral artery stenting and subintimal recanalization of an occluded left anterior tibial artery with balloon angioplasty in 2011. This was done in the setting of an ischemic left toe ulcer which ultimately required left great toe amputation. In May of 2014, he underwent successful recanalization of an occluded right anterior tibial artery with subsequent balloon angioplasty using a 3 mm balloon. He was brought back several weeks later to address a high-grade stenosis vs. occlusion of his left superficial femoral artery stent. He underwent successful stenting of a nearly occluded left superficial femoral artery stent using a 7 x 1 20 self-expanding stent. This was done in June of 2014. The patient has gone on to receive wound care at the wound center and has now completely healed all of his ulcers.   He states his heel ulcer have healed. He denies any barriers to exercise, he walks to mailbox 400 feet daily.  Pt. denies claudication in legs when walking. Pt. denies rest pain; denies night pain denies non healing ulcers on lower extremites.  He denies history of stroke or TIA.  The patient denies New Medical or Surgical History  Pt Diabetic: Yes, in control, states rarely above 120 glucose Pt smoker: former smoker, quit in 1990  Pt meds include: Statin :Yes ASA: Yes Other anticoagulants/antiplatelets: takes coumadin for history of 2 heart valve replacements     Past Medical History  Diagnosis Date  . Erectile dysfunction   . Obesity   . OSA (obstructive sleep apnea)     not using CPAP  . History of nephrolithiasis   . HTN (hypertension)   .  Aortal stenosis 2006    biophrostetic  . Atrial fibrillation     onset after CABG--- coumadin management at [facility] clinic  . S/P aortic valve replacement   . CHF (congestive heart failure)     abter CABG  . Pacemaker 2/07    VVI  . PVD (peripheral vascular disease)     CT angio showed- vascular insuff, intestine and RAS bilaterally, andgiogram 02/2009 severe PVD medical management  . Skin cancer     surgery (nose) 09-2010  . Sick sinus syndrome     [name]  . Hyperlipidemia   . Arthritis   . Gangrene   . Coronary artery disease   . DM type 2 (diabetes mellitus, type 2)     used to see Dr Meredith Pel, now f/u by Dr Larose Kells  . Memory loss     Social History History  Substance Use Topics  . Smoking status: Former Smoker    Types: Cigarettes    Quit date: 06/23/1988  . Smokeless tobacco: Former Systems developer  . Alcohol Use: No    Family History Family History  Problem Relation Age of Onset  . Heart attack Father 26  . Heart disease Father     Before age 72  . Hypertension Father   . Stroke Mother 57  . Cancer Sister     Cervical  . Heart attack Sister   . Heart disease Sister   . Colon cancer Neg Hx   . Prostate cancer Neg Hx   . Diabetes Sister  2 sister w/ DM  . Heart attack Sister   . Heart attack Sister   . Heart disease Sister 74    AAA  and  Stomach Aneurysm    Past Surgical History  Procedure Laterality Date  . Aortic valve replacement      biophrostetic for Ao Stenosis 2006  . Coronary artery bypass graft  2006  . Tonsillectomy    . Cataract extraction, bilateral    . Amputation  01/2010    great toe  . Pacemaker placement  07/2005    Medtronic Sigma SR implanted by Dr Olevia Perches  . Aortic valve replacement  01/17/2011    S/P redo median sternotomy, extracorporeal circulation, redo Aortic Valve Replacement using a 23-mm Edwards pericardial Magna-Ease valve, Dr Ulla Gallo  . Angioplasty / stenting femoral  02/10/10    Left femoral  . Incise and  drain abcess Right May 2014    right heel   . Eye surgery      Allergies  Allergen Reactions  . Metoprolol Succinate Other (See Comments)    Extremely tired  . Potassium-Containing Compounds     IV--loss of conciousness    Current Outpatient Prescriptions  Medication Sig Dispense Refill  . aspirin EC 81 MG tablet Take 81 mg by mouth daily.    Marland Kitchen donepezil (ARICEPT) 10 MG tablet Take 1 tablet (10 mg total) by mouth at bedtime. 30 tablet 5  . ezetimibe-simvastatin (VYTORIN) 10-40 MG per tablet Take 1 tablet by mouth at bedtime. 30 tablet 3  . furosemide (LASIX) 80 MG tablet Take 80 qam and every other day 40 qpm 120 tablet 3  . insulin glargine (LANTUS) 100 UNIT/ML injection Inject 65-80 Units into the skin at bedtime.     . insulin glulisine (APIDRA) 100 UNIT/ML injection Inject 8-14 Units into the skin 3 (three) times daily with meals. Sliding scale as directed    . lisinopril (PRINIVIL,ZESTRIL) 5 MG tablet take 1 tablet by mouth once daily for high blood pressure 90 tablet 3  . spironolactone (ALDACTONE) 25 MG tablet take 1 tablet by mouth once daily 90 tablet 3  . warfarin (COUMADIN) 5 MG tablet Take as directed by anticoagulation clinic 120 tablet 1   No current facility-administered medications for this visit.    ROS: See HPI for pertinent positives and negatives.   Physical Examination  Filed Vitals:   05/15/14 1523  BP: 121/57  Pulse: 65  Resp: 16  Height: 5\' 9"  (1.753 m)  Weight: 232 lb (105.235 kg)  SpO2: 89%   Body mass index is 34.24 kg/(m^2).  General: A&O x 3, WDWN, obese male. Gait: normalEyes: PERRLA. Pulmonary: CTAB, without wheezes , rales or rhonchi. Cardiac: regular Rythm , with murmur. Subcutaneous pacemaker left upper chest.     Carotid Bruits Left Right   Transmitted cardiac murmur Transmitted cardiac murmur  Aorta is not palpable. Radial pulses: are 1+ palpable and =.   VASCULAR EXAM: Extremities with  ischemic changes : mild rubor in plantar surfaces of right foot. without Gangrene; without open wounds. Left great toe is surgically absent.     LE Pulses LEFT RIGHT   FEMORAL  palpable not palpable    POPLITEAL not palpable  not palpable   POSTERIOR TIBIAL not palpable  not palpable    DORSALIS PEDIS  ANTERIOR TIBIAL faintly palpable  not palpable    Abdomen: soft, NT, no palpated masses, large panus. Skin: no rashes, no ulcers noted. Musculoskeletal: no muscle wasting or atrophy.Left great toe  is surgically absent. Neurologic: A&O X 3; Appropriate Affect ; SENSATION: normal; MOTOR FUNCTION: moving all extremities equally, motor strength 5/5 throughout. Speech is fluent/normal. CN 2-12 intact.    Non-Invasive Vascular Imaging: DATE: 05/15/2014 LOWER EXTREMITY ARTERIAL DUPLEX EVALUATION    INDICATION: Peripheral vascular disease    PREVIOUS INTERVENTION(S): Left superficial femoral artery stent placed 02/10/2010 and 11/23/2012 Right anterior tibial artery percutaneous transluminal angioplasty 11/10/2012.    DUPLEX EXAM:     RIGHT  LEFT   Peak Systolic Velocity (cm/s) Ratio (if abnormal) Waveform  Peak Systolic Velocity (cm/s) Ratio (if abnormal) Waveform  155  B Common Femoral Artery 142  B  236  B Deep Femoral Artery 246  B  100/194  B/B Superficial Femoral Artery Proximal 90/75  B/B  109  B Superficial Femoral Artery Mid 115/183/140  B/B/B  100  B Superficial Femoral Artery Distal 140/73  B/B  66/38  B/B Popliteal Artery 40/92  B/B  22  M Posterior Tibial Artery Dist 78  B  80  B Anterior Tibial Artery Distal 35  M  NV  NV Peroneal Artery Distal 54  B   Today's ABI / TBI   0.36=TOE BRACHIAL INDEX Previous ABI / TBI (10/05/2013  ) 0.55=TOE BRACHIAL INDEX    Waveform:    M -  Monophasic       B - Biphasic       T - Triphasic  If Ankle Brachial Index (ABI) or Toe Brachial Index (TBI) performed, please see complete report     ADDITIONAL FINDINGS: Patent left superficial femoral artery stent from proximal thigh to mid/distal thigh with PSV ranging from 115cm/sec to 183cm/sec.     IMPRESSION: Bilateral profunda femoral artery stenosis present of greater than 50%. Right proximal/mid superficial femoral artery stenosis present of less than 50%. No color or spectral Doppler waveform could be obtained involving the right peroneal artery.  Remainder of the bilateral lower extremity arterial systems are patent, without hemodynamically significant stenosis present.    Compared to the previous exam:  Stable right toe brachial index and decreased left since previous study on 10/05/2013.     ASSESSMENT: [name]. is a 79 y.o. male who is s/p left superficial femoral artery stent on 02/10/10 & 11/23/12, and right anterior tibial artery angioplasty on 11/10/12. The patient denies any barriers to walking but admits to not walking much, he has no claudication symptoms with walking, no tissue loss. Today's bilateral LE arterial Duplex reveals bilateral profunda femoral artery stenosis present of greater than 50%. Right proximal/mid superficial femoral artery stenosis present of less than 50%. No color or spectral Doppler waveform could be obtained involving the right peroneal artery.  Remainder of the bilateral lower extremity arterial systems are patent, without hemodynamically significant stenosis present. ABI's are unreliable due to vessel wall calcification and non-compressibility.  Stable right toe brachial index and decreased left since previous study on 10/05/2013.   PLAN:  Graduated walking program and daily seated leg exercises as demonstrated and discussed.  I discussed in depth with the patient the nature of atherosclerosis, and emphasized the importance of  maximal medical management including strict control of blood pressure, blood glucose, and lipid levels, obtaining regular exercise, and continued cessation of smoking.  The patient is aware that without maximal medical management the underlying atherosclerotic disease process will progress, limiting the benefit of any interventions.  Based on the patient's vascular studies and examination, and after discussing with Dr. Trula Slade, pt will return to  clinic in 1 year with ABI's and bilateral LE arterial Duplex.  The patient was given information about PAD including signs, symptoms, treatment, what symptoms should prompt the patient to seek immediate medical care, and risk reduction measures to take.  Clemon Chambers, RN, MSN, FNP-C [facility] Phone: [phone]  Clinic MD: Trula Slade  05/15/2014 3:13 PM

## 2014-05-16 ENCOUNTER — Encounter: Payer: Self-pay | Admitting: Neurology

## 2014-05-17 NOTE — Addendum Note (Signed)
Addended by: Dorthula Rue L on: 05/17/2014 03:26 PM   Modules accepted: Orders

## 2014-05-22 NOTE — Telephone Encounter (Signed)
Left message for patient's daughter Malachy Mood and relayed that when I spoke to the home health agency they only go out for skilled nursing or PT/OT.  They recommended calling Home instead or [facility], to see if they can provide the aid and assistance in the home.

## 2014-05-29 ENCOUNTER — Encounter: Payer: Medicare Other | Admitting: Internal Medicine

## 2014-05-31 ENCOUNTER — Encounter: Payer: Self-pay | Admitting: Internal Medicine

## 2014-05-31 ENCOUNTER — Telehealth: Payer: Self-pay

## 2014-05-31 ENCOUNTER — Ambulatory Visit (INDEPENDENT_AMBULATORY_CARE_PROVIDER_SITE_OTHER): Payer: Medicare Other | Admitting: Internal Medicine

## 2014-05-31 VITALS — BP 182/77 | HR 79 | Temp 98.5°F | Wt 224.4 lb

## 2014-05-31 DIAGNOSIS — IMO0002 Reserved for concepts with insufficient information to code with codable children: Secondary | ICD-10-CM

## 2014-05-31 DIAGNOSIS — E1159 Type 2 diabetes mellitus with other circulatory complications: Secondary | ICD-10-CM

## 2014-05-31 DIAGNOSIS — E1165 Type 2 diabetes mellitus with hyperglycemia: Secondary | ICD-10-CM

## 2014-05-31 DIAGNOSIS — L03818 Cellulitis of other sites: Secondary | ICD-10-CM | POA: Diagnosis not present

## 2014-05-31 DIAGNOSIS — I10 Essential (primary) hypertension: Secondary | ICD-10-CM

## 2014-05-31 DIAGNOSIS — I2510 Atherosclerotic heart disease of native coronary artery without angina pectoris: Secondary | ICD-10-CM

## 2014-05-31 DIAGNOSIS — I4891 Unspecified atrial fibrillation: Secondary | ICD-10-CM | POA: Diagnosis not present

## 2014-05-31 DIAGNOSIS — E1151 Type 2 diabetes mellitus with diabetic peripheral angiopathy without gangrene: Secondary | ICD-10-CM

## 2014-05-31 DIAGNOSIS — I495 Sick sinus syndrome: Secondary | ICD-10-CM

## 2014-05-31 DIAGNOSIS — Z950 Presence of cardiac pacemaker: Secondary | ICD-10-CM | POA: Diagnosis not present

## 2014-05-31 MED ORDER — DOXYCYCLINE HYCLATE 100 MG PO TABS: 100.0000 mg | ORAL_TABLET | Freq: Two times a day (BID) | ORAL | Status: DC

## 2014-05-31 NOTE — Telephone Encounter (Signed)
Patient's daughter called (not on DPR) stating that she feels her father needs to be in a hospital with IV's and drains to help take care the MRSA. Did not give any medical information and advised daughter that I could not due to the DPR situation. She states that she feels that we have made a bad call by not admitting him. That Dr Larose Kells in incompetent by not sending him to the hospital. Advised that we cannot admit a person that is not in need of hospitalization. Stated that I was taking it lightly and that there was no way that he could take care of himself. I advised daughter that if she felt he was in need of more attention that she should take him to ED. States that he will not go. She stated obviously she was talking "to a brick wall that cares nothing about her dad". Advised her that I will take her concerns and pass them along but that I could not discuss his medical condition with her due to lack of authorization. Please advise!!!

## 2014-05-31 NOTE — Assessment & Plan Note (Signed)
  Cellulitis, Abdominal wall cellulitis, other family members have been diagnosed with MRSA. Nothing to culture at this time Plan:  Doxycycline Strongly advised to call if not improving, risk of sepsis and getting very sick  from this infection discussed, he and the wife verbalized understanding. Checkup in one week

## 2014-05-31 NOTE — Patient Instructions (Signed)
Start taking doxycycline twice a day today Watch the area of infection closely: If it gets bigger, you have fever, chills, nausea, vomiting: Let me know immediately If it comes to a head also let us know  The antibiotic may affect your Coumadin, please call your Coumadin doctor and ask  for a check within a week  Lantus: 50 units every day Apidra:  35 units 3 times a day Is very important you take your insulin as prescribed  Come back for a checkup in one week

## 2014-05-31 NOTE — Assessment & Plan Note (Addendum)
Diabetes, Patient's wife states he will not take insulin as prescribed, not even daily. Last A1c more than 12. Recommend good compliance with Lantus 50 and Apidra 35 u 3 times a day. Refer to endocrinology Dr. Cruzita Lederer

## 2014-05-31 NOTE — Telephone Encounter (Addendum)
The patient has cellulitis, it could be MRSA but I have no documentation. He is afebrile, not hypotensive or  tachycardic, not in need of any incision and drainage. Oxygen was a slightly low but he has COPD. Please call the patient tomorrow, see how he's doing. If he like [place] to be able  to discuss his medical issues with the daughter -- needs a  DPR Also, reminded him to call his Coumadin doctor and get that checked within a week

## 2014-05-31 NOTE — Progress Notes (Signed)
Pre visit review using our clinic review tool, if applicable. No additional management support is needed unless otherwise documented below in the visit note. 

## 2014-05-31 NOTE — Progress Notes (Signed)
Subjective:    Patient ID: Seth Pacific., male    DOB: May 25, 1935, 79 y.o.   MRN: [id number]  DOS:  05/31/2014 Type of visit - description : acute, last ov here [id number],here w/ his wife Interval history: Developed a red spot at the abdomen about a week ago. Denies any cuts, insect bites, discharge. The area is tender and warm. We went over his medications, hise wife states that he takes his insulin only sometimes, he will not do it daily unfortunately. No recent ambulatory blood pressures No recent ambulatory BPs.       Past Medical History  Diagnosis Date  . Erectile dysfunction   . Obesity   . OSA (obstructive sleep apnea)     not using CPAP  . History of nephrolithiasis   . HTN (hypertension)   . Aortal stenosis 2006    biophrostetic  . Atrial fibrillation     onset after CABG--- coumadin management at [facility]madin clinic  . S/P aortic valve replacement   . CHF (congestive heart failure)     abter CABG  . Pacemaker 2/07    VVI  . PVD (peripheral vascular disease)     CT angio showed- vascular insuff, intestine and RAS bilaterally, andgiogram 02/2009 severe PVD medical management  . Skin cancer     surgery (nose) 09-2010  . Sick sinus syndrome     [name]  . Hyperlipidemia   . Arthritis   . Gangrene   . Coronary artery disease   . DM type 2 (diabetes mellitus, type 2)     used to see Dr Meredith Pel, now f/u by Dr Larose Kells  . Memory loss     Past Surgical History  Procedure Laterality Date  . Aortic valve replacement      biophrostetic for Ao Stenosis 2006  . Coronary artery bypass graft  2006  . Tonsillectomy    . Cataract extraction, bilateral    . Amputation  01/2010    great toe  . Pacemaker placement  07/2005    Medtronic Sigma SR implanted by Dr Olevia Perches  . Aortic valve replacement  01/17/2011    S/P redo median sternotomy, extracorporeal circulation, redo Aortic Valve Replacement using a 23-mm Edwards pericardial Magna-Ease valve, Dr Ulla Gallo    . Angioplasty / stenting femoral  02/10/10    Left femoral  . Incise and drain abcess Right May 2014    right heel   . Eye surgery      History   Social History  . Marital Status: Married    Spouse Name: N/A    Number of Children: 2  . Years of Education: N/A   Occupational History  . retired- long distance Administrator     Social History Main Topics  . Smoking status: Former Smoker    Types: Cigarettes    Quit date: 06/23/1988  . Smokeless tobacco: Former Systems developer  . Alcohol Use: No  . Drug Use: No  . Sexual Activity: Not Currently   Other Topics Concern  . Not on file   Social History Narrative   Diet: healthy most of the time--   2 step children                    Medication List       This list is accurate as of: 05/31/14  1:17 PM.  Always use your most recent med list.  APIDRA 100 UNIT/ML injection  Generic drug:  insulin glulisine  Inject 8-14 Units into the skin 3 (three) times daily with meals. Sliding scale as directed     aspirin EC 81 MG tablet  Take 81 mg by mouth daily.     donepezil 10 MG tablet  Commonly known as:  ARICEPT  Take 1 tablet (10 mg total) by mouth at bedtime.     ezetimibe-simvastatin 10-40 MG per tablet  Commonly known as:  VYTORIN  Take 1 tablet by mouth at bedtime.     furosemide 80 MG tablet  Commonly known as:  LASIX  Take 80 qam and every other day 40 qpm     insulin glargine 100 UNIT/ML injection  Commonly known as:  LANTUS  Inject 65-80 Units into the skin at bedtime.     lisinopril 5 MG tablet  Commonly known as:  PRINIVIL,ZESTRIL  take 1 tablet by mouth once daily for high blood pressure     spironolactone 25 MG tablet  Commonly known as:  ALDACTONE  take 1 tablet by mouth once daily     warfarin 5 MG tablet  Commonly known as:  COUMADIN  Take as directed by anticoagulation clinic           Objective:   Physical Exam  Abdominal:     BP 182/77 mmHg  Pulse 79  Temp(Src) 98.5  F (36.9 C) (Oral)  Wt 224 lb 6 oz (101.776 kg)  SpO2 89%  General -- alert, well-developed, NAD.  Lungs -- normal respiratory effort, no intercostal retractions, no accessory muscle use, and normal breath sounds.  Heart-- normal rate, regular rhythm, soft syst  murmur.  Psych-- Cognition and judgment appear intact. Cooperative with normal attention span and concentration. No anxious or depressed appearing.     Assessment & Plan:   Hypertension, BP today elevated, no ambulatory BPs, compliance with medication?  Coumadin therapy, recommend to call his coumadin doctor as doxycycline will affect the INR (however at this point he definitely needs doxycycline)

## 2014-06-01 ENCOUNTER — Encounter (HOSPITAL_COMMUNITY): Payer: Self-pay | Admitting: Surgery

## 2014-06-01 NOTE — Telephone Encounter (Signed)
Spoke with patient who was in good spirits and states that he feels fine. He picked up his medication and states that he is taken in. Cellulitis is not any better as of yet but has not changed for the worse. Advised to let us know if he does not start to see improvement. States understanding.

## 2014-06-02 ENCOUNTER — Encounter (HOSPITAL_COMMUNITY): Payer: Self-pay | Admitting: *Deleted

## 2014-06-02 ENCOUNTER — Inpatient Hospital Stay (HOSPITAL_COMMUNITY): Payer: Medicare Other

## 2014-06-02 ENCOUNTER — Emergency Department (HOSPITAL_COMMUNITY): Payer: Medicare Other

## 2014-06-02 ENCOUNTER — Inpatient Hospital Stay (HOSPITAL_COMMUNITY)
Admission: EM | Admit: 2014-06-02 | Discharge: 2014-06-07 | DRG: 580 | Disposition: A | Discharge status: Skilled Nursing Facility | Payer: Medicare Other | Attending: Internal Medicine | Admitting: Internal Medicine

## 2014-06-02 DIAGNOSIS — R0600 Dyspnea, unspecified: Secondary | ICD-10-CM

## 2014-06-02 DIAGNOSIS — Z8249 Family history of ischemic heart disease and other diseases of the circulatory system: Secondary | ICD-10-CM

## 2014-06-02 DIAGNOSIS — E871 Hypo-osmolality and hyponatremia: Secondary | ICD-10-CM | POA: Diagnosis present

## 2014-06-02 DIAGNOSIS — M1990 Unspecified osteoarthritis, unspecified site: Secondary | ICD-10-CM | POA: Diagnosis present

## 2014-06-02 DIAGNOSIS — E785 Hyperlipidemia, unspecified: Secondary | ICD-10-CM | POA: Diagnosis present

## 2014-06-02 DIAGNOSIS — Z87891 Personal history of nicotine dependence: Secondary | ICD-10-CM | POA: Diagnosis not present

## 2014-06-02 DIAGNOSIS — I10 Essential (primary) hypertension: Secondary | ICD-10-CM | POA: Diagnosis present

## 2014-06-02 DIAGNOSIS — I350 Nonrheumatic aortic (valve) stenosis: Secondary | ICD-10-CM

## 2014-06-02 DIAGNOSIS — E1159 Type 2 diabetes mellitus with other circulatory complications: Secondary | ICD-10-CM

## 2014-06-02 DIAGNOSIS — Z794 Long term (current) use of insulin: Secondary | ICD-10-CM | POA: Diagnosis not present

## 2014-06-02 DIAGNOSIS — E1165 Type 2 diabetes mellitus with hyperglycemia: Secondary | ICD-10-CM

## 2014-06-02 DIAGNOSIS — N529 Male erectile dysfunction, unspecified: Secondary | ICD-10-CM | POA: Diagnosis present

## 2014-06-02 DIAGNOSIS — Z833 Family history of diabetes mellitus: Secondary | ICD-10-CM

## 2014-06-02 DIAGNOSIS — L02211 Cutaneous abscess of abdominal wall: Secondary | ICD-10-CM | POA: Diagnosis present

## 2014-06-02 DIAGNOSIS — R413 Other amnesia: Secondary | ICD-10-CM | POA: Diagnosis present

## 2014-06-02 DIAGNOSIS — R0989 Other specified symptoms and signs involving the circulatory and respiratory systems: Secondary | ICD-10-CM | POA: Diagnosis not present

## 2014-06-02 DIAGNOSIS — Z87442 Personal history of urinary calculi: Secondary | ICD-10-CM | POA: Diagnosis not present

## 2014-06-02 DIAGNOSIS — K567 Ileus, unspecified: Secondary | ICD-10-CM

## 2014-06-02 DIAGNOSIS — Z888 Allergy status to other drugs, medicaments and biological substances status: Secondary | ICD-10-CM

## 2014-06-02 DIAGNOSIS — Z6831 Body mass index (BMI) 31.0-31.9, adult: Secondary | ICD-10-CM

## 2014-06-02 DIAGNOSIS — Z7982 Long term (current) use of aspirin: Secondary | ICD-10-CM

## 2014-06-02 DIAGNOSIS — G4733 Obstructive sleep apnea (adult) (pediatric): Secondary | ICD-10-CM | POA: Diagnosis present

## 2014-06-02 DIAGNOSIS — Z7901 Long term (current) use of anticoagulants: Secondary | ICD-10-CM

## 2014-06-02 DIAGNOSIS — I4891 Unspecified atrial fibrillation: Secondary | ICD-10-CM | POA: Diagnosis not present

## 2014-06-02 DIAGNOSIS — E11649 Type 2 diabetes mellitus with hypoglycemia without coma: Secondary | ICD-10-CM | POA: Diagnosis present

## 2014-06-02 DIAGNOSIS — Z950 Presence of cardiac pacemaker: Secondary | ICD-10-CM

## 2014-06-02 DIAGNOSIS — E1151 Type 2 diabetes mellitus with diabetic peripheral angiopathy without gangrene: Secondary | ICD-10-CM | POA: Diagnosis present

## 2014-06-02 DIAGNOSIS — IMO0002 Reserved for concepts with insufficient information to code with codable children: Secondary | ICD-10-CM | POA: Diagnosis present

## 2014-06-02 DIAGNOSIS — E878 Other disorders of electrolyte and fluid balance, not elsewhere classified: Secondary | ICD-10-CM | POA: Diagnosis present

## 2014-06-02 DIAGNOSIS — I495 Sick sinus syndrome: Secondary | ICD-10-CM | POA: Diagnosis present

## 2014-06-02 DIAGNOSIS — Z85828 Personal history of other malignant neoplasm of skin: Secondary | ICD-10-CM | POA: Diagnosis not present

## 2014-06-02 DIAGNOSIS — L03311 Cellulitis of abdominal wall: Secondary | ICD-10-CM | POA: Diagnosis present

## 2014-06-02 DIAGNOSIS — Z952 Presence of prosthetic heart valve: Secondary | ICD-10-CM

## 2014-06-02 DIAGNOSIS — S31109A Unspecified open wound of abdominal wall, unspecified quadrant without penetration into peritoneal cavity, initial encounter: Secondary | ICD-10-CM | POA: Diagnosis not present

## 2014-06-02 DIAGNOSIS — Z95820 Peripheral vascular angioplasty status with implants and grafts: Secondary | ICD-10-CM | POA: Diagnosis not present

## 2014-06-02 DIAGNOSIS — Z89419 Acquired absence of unspecified great toe: Secondary | ICD-10-CM

## 2014-06-02 DIAGNOSIS — I2510 Atherosclerotic heart disease of native coronary artery without angina pectoris: Secondary | ICD-10-CM | POA: Diagnosis present

## 2014-06-02 DIAGNOSIS — R112 Nausea with vomiting, unspecified: Secondary | ICD-10-CM | POA: Diagnosis not present

## 2014-06-02 DIAGNOSIS — I5032 Chronic diastolic (congestive) heart failure: Secondary | ICD-10-CM | POA: Diagnosis present

## 2014-06-02 DIAGNOSIS — Z951 Presence of aortocoronary bypass graft: Secondary | ICD-10-CM

## 2014-06-02 DIAGNOSIS — I482 Chronic atrial fibrillation: Secondary | ICD-10-CM | POA: Diagnosis present

## 2014-06-02 DIAGNOSIS — Z79899 Other long term (current) drug therapy: Secondary | ICD-10-CM | POA: Diagnosis not present

## 2014-06-02 DIAGNOSIS — R278 Other lack of coordination: Secondary | ICD-10-CM | POA: Diagnosis not present

## 2014-06-02 DIAGNOSIS — M6281 Muscle weakness (generalized): Secondary | ICD-10-CM | POA: Diagnosis not present

## 2014-06-02 DIAGNOSIS — R509 Fever, unspecified: Secondary | ICD-10-CM | POA: Diagnosis not present

## 2014-06-02 DIAGNOSIS — L0390 Cellulitis, unspecified: Secondary | ICD-10-CM | POA: Diagnosis present

## 2014-06-02 DIAGNOSIS — K6389 Other specified diseases of intestine: Secondary | ICD-10-CM | POA: Diagnosis not present

## 2014-06-02 DIAGNOSIS — R41841 Cognitive communication deficit: Secondary | ICD-10-CM | POA: Diagnosis not present

## 2014-06-02 LAB — COMPREHENSIVE METABOLIC PANEL
ALK PHOS: 142 U/L — AB (ref 39–117)
ALT: 16 U/L (ref 0–53)
AST: 21 U/L (ref 0–37)
Albumin: 3 g/dL — ABNORMAL LOW (ref 3.5–5.2)
Anion gap: 15 (ref 5–15)
BUN: 20 mg/dL (ref 6–23)
CALCIUM: 10.3 mg/dL (ref 8.4–10.5)
CO2: 24 meq/L (ref 19–32)
Chloride: 95 mEq/L — ABNORMAL LOW (ref 96–112)
Creatinine, Ser: 1.14 mg/dL (ref 0.50–1.35)
GFR calc Af Amer: 70 mL/min — ABNORMAL LOW (ref >90)
GFR calc non Af Amer: 60 mL/min — ABNORMAL LOW (ref >90)
Glucose, Bld: 173 mg/dL — ABNORMAL HIGH (ref 70–99)
Potassium: 4.3 mEq/L (ref 3.7–5.3)
SODIUM: 134 meq/L — AB (ref 137–147)
TOTAL PROTEIN: 8.3 g/dL (ref 6.0–8.3)
Total Bilirubin: 1 mg/dL (ref 0.3–1.2)

## 2014-06-02 LAB — CBC WITH DIFFERENTIAL/PLATELET
BASOS ABS: 0 10*3/uL (ref 0.0–0.1)
Basophils Relative: 0 % (ref 0–1)
EOS ABS: 0.1 10*3/uL (ref 0.0–0.7)
Eosinophils Relative: 1 % (ref 0–5)
HCT: 46.9 % (ref 39.0–52.0)
Hemoglobin: 15.8 g/dL (ref 13.0–17.0)
LYMPHS ABS: 1.8 10*3/uL (ref 0.7–4.0)
LYMPHS PCT: 14 % (ref 12–46)
MCH: 30 pg (ref 26.0–34.0)
MCHC: 33.7 g/dL (ref 30.0–36.0)
MCV: 89.2 fL (ref 78.0–100.0)
Monocytes Absolute: 1.4 10*3/uL — ABNORMAL HIGH (ref 0.1–1.0)
Monocytes Relative: 11 % (ref 3–12)
NEUTROS PCT: 74 % (ref 43–77)
Neutro Abs: 9.8 10*3/uL — ABNORMAL HIGH (ref 1.7–7.7)
PLATELETS: 205 10*3/uL (ref 150–400)
RBC: 5.26 MIL/uL (ref 4.22–5.81)
RDW: 13.8 % (ref 11.5–15.5)
WBC: 13.2 10*3/uL — AB (ref 4.0–10.5)

## 2014-06-02 LAB — PROTIME-INR
INR: 3.6 — AB (ref 0.00–1.49)
Prothrombin Time: 36.2 seconds — ABNORMAL HIGH (ref 11.6–15.2)

## 2014-06-02 LAB — GLUCOSE, CAPILLARY
GLUCOSE-CAPILLARY: 214 mg/dL — AB (ref 70–99)
Glucose-Capillary: 280 mg/dL — ABNORMAL HIGH (ref 70–99)

## 2014-06-02 MED ORDER — MORPHINE SULFATE 4 MG/ML IJ SOLN
4.0000 mg | Freq: Once | INTRAMUSCULAR | Status: AC
  Administered 2014-06-02: 4 mg via INTRAVENOUS
  Filled 2014-06-02: qty 1

## 2014-06-02 MED ORDER — INSULIN GLARGINE 100 UNIT/ML ~~LOC~~ SOLN
65.0000 [IU] | Freq: Every day | SUBCUTANEOUS | Status: DC
  Administered 2014-06-02: 65 [IU] via SUBCUTANEOUS
  Filled 2014-06-02 (×2): qty 0.65

## 2014-06-02 MED ORDER — SODIUM CHLORIDE 0.9 % IV SOLN: 250.0000 mL | INTRAVENOUS | Status: DC | PRN

## 2014-06-02 MED ORDER — WARFARIN SODIUM 2.5 MG PO TABS
2.5000 mg | ORAL_TABLET | ORAL | Status: AC
  Administered 2014-06-02: 2.5 mg via ORAL
  Filled 2014-06-02: qty 1

## 2014-06-02 MED ORDER — FUROSEMIDE 40 MG PO TABS
40.0000 mg | ORAL_TABLET | Freq: Two times a day (BID) | ORAL | Status: DC
  Administered 2014-06-03 – 2014-06-07 (×9): 40 mg via ORAL
  Filled 2014-06-02 (×12): qty 1

## 2014-06-02 MED ORDER — POLYETHYLENE GLYCOL 3350 17 G PO PACK
17.0000 g | PACK | Freq: Every day | ORAL | Status: DC | PRN
  Filled 2014-06-02: qty 1

## 2014-06-02 MED ORDER — VANCOMYCIN HCL IN DEXTROSE 1-5 GM/200ML-% IV SOLN
1000.0000 mg | Freq: Two times a day (BID) | INTRAVENOUS | Status: DC
  Administered 2014-06-03 – 2014-06-06 (×8): 1000 mg via INTRAVENOUS
  Filled 2014-06-02 (×10): qty 200

## 2014-06-02 MED ORDER — EZETIMIBE-SIMVASTATIN 10-40 MG PO TABS
1.0000 | ORAL_TABLET | Freq: Every day | ORAL | Status: DC
Start: 2014-06-02 — End: 2014-06-07
  Administered 2014-06-02 – 2014-06-06 (×5): 1 via ORAL
  Filled 2014-06-02 (×7): qty 1

## 2014-06-02 MED ORDER — HYDROCODONE-ACETAMINOPHEN 5-325 MG PO TABS: 1.0000 | ORAL_TABLET | ORAL | Status: DC | PRN

## 2014-06-02 MED ORDER — DONEPEZIL HCL 10 MG PO TABS
10.0000 mg | ORAL_TABLET | Freq: Every day | ORAL | Status: DC
  Administered 2014-06-02 – 2014-06-06 (×5): 10 mg via ORAL
  Filled 2014-06-02 (×7): qty 1

## 2014-06-02 MED ORDER — SODIUM CHLORIDE 0.9 % IJ SOLN: 3.0000 mL | INTRAMUSCULAR | Status: DC | PRN

## 2014-06-02 MED ORDER — SPIRONOLACTONE 25 MG PO TABS
25.0000 mg | ORAL_TABLET | Freq: Every day | ORAL | Status: DC
  Administered 2014-06-02 – 2014-06-07 (×6): 25 mg via ORAL
  Filled 2014-06-02 (×6): qty 1

## 2014-06-02 MED ORDER — SODIUM CHLORIDE 0.9 % IJ SOLN
3.0000 mL | Freq: Two times a day (BID) | INTRAMUSCULAR | Status: DC
  Administered 2014-06-02 – 2014-06-06 (×9): 3 mL via INTRAVENOUS

## 2014-06-02 MED ORDER — ONDANSETRON HCL 4 MG PO TABS: 4.0000 mg | ORAL_TABLET | Freq: Four times a day (QID) | ORAL | Status: DC | PRN

## 2014-06-02 MED ORDER — INSULIN ASPART 100 UNIT/ML ~~LOC~~ SOLN
4.0000 [IU] | Freq: Three times a day (TID) | SUBCUTANEOUS | Status: DC
  Administered 2014-06-03 – 2014-06-05 (×6): 4 [IU] via SUBCUTANEOUS

## 2014-06-02 MED ORDER — ONDANSETRON HCL 4 MG/2ML IJ SOLN: 4.0000 mg | Freq: Four times a day (QID) | INTRAMUSCULAR | Status: DC | PRN

## 2014-06-02 MED ORDER — WARFARIN - PHARMACIST DOSING INPATIENT
Freq: Every day | Status: DC
  Administered 2014-06-02 – 2014-06-06 (×3)

## 2014-06-02 MED ORDER — ASPIRIN EC 81 MG PO TBEC
81.0000 mg | DELAYED_RELEASE_TABLET | Freq: Every day | ORAL | Status: DC
  Administered 2014-06-02 – 2014-06-07 (×6): 81 mg via ORAL
  Filled 2014-06-02 (×6): qty 1

## 2014-06-02 MED ORDER — LIDOCAINE-EPINEPHRINE (PF) 2 %-1:200000 IJ SOLN
10.0000 mL | Freq: Once | INTRAMUSCULAR | Status: AC
  Administered 2014-06-02: 10 mL
  Filled 2014-06-02: qty 20

## 2014-06-02 MED ORDER — CEFTRIAXONE SODIUM IN DEXTROSE 40 MG/ML IV SOLN
2.0000 g | INTRAVENOUS | Status: DC
  Administered 2014-06-02 – 2014-06-06 (×5): 2 g via INTRAVENOUS
  Filled 2014-06-02 (×6): qty 50

## 2014-06-02 MED ORDER — LISINOPRIL 5 MG PO TABS
5.0000 mg | ORAL_TABLET | Freq: Every day | ORAL | Status: DC
  Administered 2014-06-02: 5 mg via ORAL
  Filled 2014-06-02 (×2): qty 1

## 2014-06-02 MED ORDER — INSULIN ASPART 100 UNIT/ML ~~LOC~~ SOLN
0.0000 [IU] | Freq: Three times a day (TID) | SUBCUTANEOUS | Status: DC
  Administered 2014-06-04: 3 [IU] via SUBCUTANEOUS
  Administered 2014-06-04: 2 [IU] via SUBCUTANEOUS
  Administered 2014-06-05 (×2): 5 [IU] via SUBCUTANEOUS
  Administered 2014-06-06 (×2): 3 [IU] via SUBCUTANEOUS
  Administered 2014-06-07: 2 [IU] via SUBCUTANEOUS

## 2014-06-02 MED ORDER — VANCOMYCIN HCL IN DEXTROSE 1-5 GM/200ML-% IV SOLN
1000.0000 mg | Freq: Once | INTRAVENOUS | Status: AC
  Administered 2014-06-02: 1000 mg via INTRAVENOUS
  Filled 2014-06-02: qty 200

## 2014-06-02 NOTE — Consult Note (Signed)
PHARMACY CONSULT NOTE - INITIAL  Pharmacy Consult for :   Coumadin::  Vancomycin Indication:  Mechanical AVR, atrial fibrillation::  Abdominal Wall Cellulitis  Hospital Problems: Principal Problem:   Cellulitis Active Problems:   DM (diabetes mellitus) type II uncontrolled, periph vascular disorder   OBESITY, MORBID   Essential hypertension   Aortic valve stenosis, severe prosthetic   Chronic diastolic heart failure   Abscess of abdominal wall   Abdominal wall abscess   Hyponatremia   Allergies: Allergies  Allergen Reactions  . Potassium-Containing Compounds Anaphylaxis    IV--loss of conciousness  . Metoprolol Succinate Other (See Comments)    Extremely tired    Patient Measurements: Height: 5\' 10"  (177.8 cm) Weight: 221 lb 9.6 oz (100.517 kg) IBW/kg (Calculated) : 73  Vital Signs: BP 163/79 mmHg  Pulse 66  Temp(Src) 97.5 F (36.4 C) (Oral)  Resp 18  Ht 5\' 10"  (1.778 m)  Wt 221 lb 9.6 oz (100.517 kg)  BMI 31.80 kg/m2  SpO2 93%  Labs:  Recent Labs  06/02/14 1341  WBC 13.2*  HGB 15.8  PLT 205  CREATININE 1.14    06/02/14 1341 06/02/14 1509  HGB 15.8  --   HCT 46.9  --   PLT 205  --   LABPROT  --  36.2*  INR  --  3.60*  CREATININE 1.14  --     Estimated Creatinine Clearance: 64.5 mL/min (by C-G formula based on Cr of 1.14).   Microbiology: No results found for this or any previous visit (from the past 720 hour(s)).  Medical/Surgical History: Past Medical History  Diagnosis Date  . Erectile dysfunction   . Obesity   . OSA (obstructive sleep apnea)     not using CPAP  . History of nephrolithiasis   . HTN (hypertension)   . Aortal stenosis 2006    biophrostetic  . Atrial fibrillation     onset after CABG--- [facility]madin management [facility]  . S/P aortic valve replacement   . CHF (congestive heart failure)     abter CABG  . Pacemaker 2/07    VVI  . PVD (peripheral vascular disease)     CT angio showed- vascular insuff,  intestine and RAS bilaterally, andgiogram 02/2009 severe PVD medical management  . Skin cancer     surgery (nose) 09-2010  . Sick sinus syndrome     [name]  . Hyperlipidemia   . Arthritis   . Gangrene   . Coronary artery disease   . DM type 2 (diabetes mellitus, type 2)     used to see Dr Meredith Pel, now f/u by Dr Larose Kells  . Memory loss    Past Surgical History  Procedure Laterality Date  . Aortic valve replacement      biophrostetic for Ao Stenosis 2006  . Coronary artery bypass graft  2006  . Tonsillectomy    . Cataract extraction, bilateral    . Amputation  01/2010    great toe  . Pacemaker placement  07/2005    Medtronic Sigma SR implanted by Dr Olevia Perches  . Aortic valve replacement  01/17/2011    S/P redo median sternotomy, extracorporeal circulation, redo Aortic Valve Replacement using a 23-mm Edwards pericardial Magna-Ease valve, Dr Ulla Gallo  . Angioplasty / stenting femoral  02/10/10    Left femoral  . Incise and drain abcess Right May 2014    right heel   . Eye surgery    . Abdominal aortagram N/A 11/10/2012  Procedure: ABDOMINAL AORTAGRAM;  Surgeon: Serafina Mitchell, MD;  Location: [facility] CATH LAB;  Service: Cardiovascular;  Laterality: N/A;  . Lower extremity angiogram Left 11/23/2012    Procedure: LOWER EXTREMITY ANGIOGRAM;  Surgeon: Serafina Mitchell, MD;  Location: [facility] CATH LAB;  Service: Cardiovascular;  Laterality: Left;    Current Medication[s] Include: Medication PTA: Medication Sig  . aspirin EC 81 MG tablet Take 81 mg by mouth daily.  Marland Kitchen donepezil (ARICEPT) 10 MG tablet Take 1 tablet (10 mg total) by mouth at bedtime.  Marland Kitchen doxycycline (VIBRA-TABS) 100 MG tablet Take 1 tablet (100 mg total) by mouth 2 (two) times daily.  Marland Kitchen ezetimibe-simvastatin (VYTORIN) 10-40 MG per tablet Take 1 tablet by mouth at bedtime.  . furosemide (LASIX) 80 MG tablet Take 80 qam and every other day 40 qpm  . insulin glargine (LANTUS) 100 UNIT/ML injection Inject 65-80 Units into the  skin at bedtime.   . insulin glulisine (APIDRA) 100 UNIT/ML injection Inject 8-14 Units into the skin 3 (three) times daily with meals. Sliding scale as directed  . lisinopril (PRINIVIL,ZESTRIL) 5 MG tablet take 1 tablet by mouth once daily for high blood pressure  . spironolactone (ALDACTONE) 25 MG tablet take 1 tablet by mouth once daily  . warfarin (COUMADIN) 5 MG tablet Take 7.5 mg daily except 5 mg on Tues and Thurs.     Scheduled:  Scheduled:  . aspirin EC  81 mg Oral Daily  . donepezil  10 mg Oral QHS  . ezetimibe-simvastatin  1 tablet Oral QHS  . [START ON 06/03/2014] furosemide  40 mg Oral BID  . [START ON 06/03/2014] insulin aspart  0-15 Units Subcutaneous TID WC  . [START ON 06/03/2014] insulin aspart  4 Units Subcutaneous TID WC  . insulin glargine  65 Units Subcutaneous QHS  . lisinopril  5 mg Oral Daily  . sodium chloride  3 mL Intravenous Q12H  . spironolactone  25 mg Oral Daily   Antibiotic[s]: Anti-infectives    Start     Dose/Rate Route Frequency Ordered Stop   06/02/14 1515  vancomycin (VANCOCIN) IVPB 1000 mg/200 mL premix     1,000 mg200 mL/hr over 60 Minutes Intravenous  Once 06/02/14 1513 06/02/14 1637      Assessment:  79 y/o male on chronic Coumadin for atrial fibrillation and a mechanical AVR who is admitted with an abdominal wall cellulitis.  Patient's home Coumadin dose is 7.5 mg daily except 5 mg on Tues and Thurs.  He has not taken a dose today.  Patient was on Doxycycline prior to admission.  Pharmacy consulted for Coumadin and Vancomycin therapy.  Goal of Therapy:   Target INR 2.5-3.5   Vancomycin trough level 10-15 mcg/ml  Ceftriaxone dosing for indication and renal function with eradication of infection.  Plan:  1. Coumadin 2.5 mg today. 2. Continue Vancomycin 1 gm IV q 12 hours. 3. Begin Ceftriaxone 2 gm IV q 24 hours. 4. Daily INR's, Platelet count, CBC.  Monitor for bleeding complications.  5. Follow up SCr, UOP, cultures, Levels,  clinical course and adjust as clinically indicated.  Jori Thrall, Craig Guess,  Pharm.D,    12/11/20157:26 PM

## 2014-06-02 NOTE — Progress Notes (Signed)
Quamere Mussell [id number] Code Status: Full Admission Data: 06/02/2014 6:46 PM Attending Provider:  Aileen Fass ENI:DPOE Larose Kells, MD Consults/ Treatment Team:  [facility]   [name] is a 79 y.o. male patient admitted from ED awake, alert - oriented  X 3 - no acute distress noted.  VSS - Blood pressure 163/79, pulse 66, temperature 97.5 F (36.4 C), temperature source Oral, resp. rate 18, height 5\' 10"  (1.778 m), weight 100.517 kg (221 lb 9.6 oz), SpO2 93 %.    IV in place, occlusive dsg intact without redness.  Orientation to room, and floor completed with information packet given to patient/family.  Patient declined safety video at this time.  Admission INP armband ID verified with patient/family, and in place.   SR up x 2, fall assessment complete, with patient and family able to verbalize understanding of risk associated with falls, and verbalized understanding to call nsg before up out of bed.  Call light within reach, patient able to voice, and demonstrate understanding.  I&D done in ED by MD to abdomen covered with and ABD pad and tape.      Will cont to eval and treat per MD orders.  Delman Cheadle, RN 06/02/2014 6:46 PM

## 2014-06-02 NOTE — ED Notes (Signed)
Pt reports having abscess to left side of abd, was started on doxycycline with no relief, reports increase in redness and chills, unable to sleep at night.

## 2014-06-02 NOTE — Progress Notes (Signed)
Report received from Maudie Mercury, RN for admission to [phone]

## 2014-06-02 NOTE — ED Provider Notes (Signed)
CSN: [id number]     Arrival date & time 06/02/14  1328 History   First MD Initiated Contact with Patient 06/02/14 1438     Chief Complaint  Patient presents with  . Abscess     Patient is a 79 y.o. male presenting with abscess. The history is provided by the patient and a relative.  Abscess Abscess location: abdomen. Abscess quality: induration, painful, redness and warmth   Progression:  Worsening Pain details:    Severity:  Moderate   Duration:  1 week   Timing:  Constant   Progression:  Worsening Chronicity:  New Context: diabetes   Relieved by:  Nothing Worsened by:  Nothing tried Associated symptoms: no fever and no vomiting   Patient presents with abscess to abdominal wall for one week He saw his PCP two days ago and was started on doxycycline but it is not improving  Family reports he has appeared SOB the past several days but patient denies this currently   Past Medical History  Diagnosis Date  . Erectile dysfunction   . Obesity   . OSA (obstructive sleep apnea)     not using CPAP  . History of nephrolithiasis   . HTN (hypertension)   . Aortal stenosis 2006    biophrostetic  . Atrial fibrillation     onset after CABG--- coumadin management at [facility]  . S/P aortic valve replacement   . CHF (congestive heart failure)     abter CABG  . Pacemaker 2/07    VVI  . PVD (peripheral vascular disease)     CT angio showed- vascular insuff, intestine and RAS bilaterally, andgiogram 02/2009 severe PVD medical management  . Skin cancer     surgery (nose) 09-2010  . Sick sinus syndrome     [name]  . Hyperlipidemia   . Arthritis   . Gangrene   . Coronary artery disease   . DM type 2 (diabetes mellitus, type 2)     used to see Dr Meredith Pel, now f/u by Dr Larose Kells  . Memory loss    Past Surgical History  Procedure Laterality Date  . Aortic valve replacement      biophrostetic for Ao Stenosis 2006  . Coronary artery bypass graft  2006  .  Tonsillectomy    . Cataract extraction, bilateral    . Amputation  01/2010    great toe  . Pacemaker placement  07/2005    Medtronic Sigma SR implanted by Dr Olevia Perches  . Aortic valve replacement  01/17/2011    S/P redo median sternotomy, extracorporeal circulation, redo Aortic Valve Replacement using a 23-mm Edwards pericardial Magna-Ease valve, Dr Ulla Gallo  . Angioplasty / stenting femoral  02/10/10    Left femoral  . Incise and drain abcess Right May 2014    right heel   . Eye surgery    . Abdominal aortagram N/A 11/10/2012    Procedure: ABDOMINAL Maxcine Ham;  Surgeon: Serafina Mitchell, MD;  Location: [facility] CATH LAB;  Service: Cardiovascular;  Laterality: N/A;  . Lower extremity angiogram Left 11/23/2012    Procedure: LOWER EXTREMITY ANGIOGRAM;  Surgeon: Serafina Mitchell, MD;  Location: [facility] CATH LAB;  Service: Cardiovascular;  Laterality: Left;   Family History  Problem Relation Age of Onset  . Heart attack Father 59  . Heart disease Father     Before age 65  . Hypertension Father   . Stroke Mother 91  . Cancer Sister     Cervical  .  Heart attack Sister   . Heart disease Sister   . Colon cancer Neg Hx   . Prostate cancer Neg Hx   . Diabetes Sister     2 sister w/ DM  . Heart attack Sister   . Heart attack Sister   . Heart disease Sister 42    AAA  and  Stomach Aneurysm  . Heart disease Sister     Before age 20  . Heart attack Sister 72   History  Substance Use Topics  . Smoking status: Former Smoker    Types: Cigarettes    Quit date: 06/23/1988  . Smokeless tobacco: Former Systems developer  . Alcohol Use: No    Review of Systems  Constitutional: Negative for fever.  Cardiovascular: Negative for chest pain.  Gastrointestinal: Negative for vomiting.  Skin: Positive for wound.  All other systems reviewed and are negative.     Allergies  Potassium-containing compounds and Metoprolol succinate  Home Medications   Prior to Admission medications   Medication Sig Start Date End  Date Taking? Authorizing Provider  aspirin EC 81 MG tablet Take 81 mg by mouth daily. 09/26/10   Larey Dresser, MD  donepezil (ARICEPT) 10 MG tablet Take 1 tablet (10 mg total) by mouth at bedtime. 01/05/14   Kathrynn Ducking, MD  doxycycline (VIBRA-TABS) 100 MG tablet Take 1 tablet (100 mg total) by mouth 2 (two) times daily. 05/31/14   Colon Branch, MD  ezetimibe-simvastatin (VYTORIN) 10-40 MG per tablet Take 1 tablet by mouth at bedtime. 05/06/12   Larey Dresser, MD  furosemide (LASIX) 80 MG tablet Take 80 qam and every other day 40 qpm 02/22/14   Liliane Shi, PA-C  insulin glargine (LANTUS) 100 UNIT/ML injection Inject 65-80 Units into the skin at bedtime.     Historical Provider, MD  insulin glulisine (APIDRA) 100 UNIT/ML injection Inject 8-14 Units into the skin 3 (three) times daily with meals. Sliding scale as directed    Historical Provider, MD  lisinopril (PRINIVIL,ZESTRIL) 5 MG tablet take 1 tablet by mouth once daily for high blood pressure 02/22/14   Liliane Shi, PA-C  spironolactone (ALDACTONE) 25 MG tablet take 1 tablet by mouth once daily 04/26/14   Larey Dresser, MD  warfarin (COUMADIN) 5 MG tablet Take as directed by anticoagulation clinic 07/18/13   Larey Dresser, MD   BP 138/58 mmHg  Pulse 62  Temp(Src) 98.1 F (36.7 C) (Oral)  Resp 24  SpO2 90% Physical Exam CONSTITUTIONAL: elderly, but in no distress HEAD: Normocephalic/atraumatic EYES: EOMI ENMT: Mucous membranes moist NECK: supple no meningeal signs SPINE/BACK:entire spine nontender CV: murmur noted LUNGS: coarse BS noted bilaterally, no distress noted ABDOMEN: soft, nontender, no rebound or guarding, bowel sounds noted throughout abdomen  large abscess noted to lower abdominal wall with induration/tenderness without crepitus GU:no cva tenderness NEURO: Pt is awake/alert/appropriate, moves all extremitiesx4.  No facial droop.   EXTREMITIES: pulses normal/equal, full ROM SKIN: warm, color normal PSYCH: no  abnormalities of mood noted, alert and oriented to situation  ED Course  Procedures   5:10 PM Patient tolerated I&D well No significant bleeding Small incision made as patient is coagulopathic Dressing per nursing He was already on doxycycline prior to arrival and he was given vancomycin prior to I&D Will admit to medicine, d/w dr Venetia Constable   INCISION AND DRAINAGE Performed by: Sharyon Cable Consent: Verbal consent obtained. Timeout was performed Risks and benefits: risks, benefits and alternatives were discussed Type: abscess  Body area: abdomina wall  Anesthesia: local infiltration  Incision was made with a scalpel.  Local anesthetic: lidocaine 2% with epinephrine  Anesthetic total: 8 ml  Complexity: complex Blunt dissection to break up loculations  Drainage: purulent  Drainage amount: moderate  Packing material: no packing  Patient tolerance: Patient tolerated the procedure well with no immediate complications.     EMERGENCY DEPARTMENT US SOFT TISSUE INTERPRETATION "Study: Limited Ultrasound of the noted body part in comments below"  INDICATIONS: Soft tissue infection Multiple views of the body part are obtained with a multi-frequency linear probe  PERFORMED BY:  Myself  IMAGES ARCHIVED?: Yes  SIDE:Left  BODY PART:Abdominal wall  FINDINGS: Abcess present  LIMITATIONS:  Body Habitus  INTERPRETATION:  Abcess present and Cellulitis present    Labs Review Labs Reviewed  CBC WITH DIFFERENTIAL - Abnormal; Notable for the following:    WBC 13.2 (*)    Neutro Abs 9.8 (*)    Monocytes Absolute 1.4 (*)    All other components within normal limits  COMPREHENSIVE METABOLIC PANEL - Abnormal; Notable for the following:    Sodium 134 (*)    Chloride 95 (*)    Glucose, Bld 173 (*)    Albumin 3.0 (*)    Alkaline Phosphatase 142 (*)    GFR calc non Af Amer 60 (*)    GFR calc Af Amer 70 (*)    All other components within normal limits  PROTIME-INR -  Abnormal; Notable for the following:    Prothrombin Time 36.2 (*)    INR 3.60 (*)    All other components within normal limits    Imaging Review Dg Chest Portable 1 View  06/02/2014   CLINICAL DATA:  Abdominal wound for 5 to 7 days. Fever and congestion.  EXAM: PORTABLE CHEST - 1 VIEW  COMPARISON:  08/19/2011  FINDINGS: The cardiac silhouette, mediastinal and hilar contours are slightly prominent but stable. There are chronic bronchitic and interstitial lung changes but no definite acute infiltrate or effusion.  IMPRESSION: Chronic lung changes without definite acute pulmonary findings.   Electronically Signed   By: Kalman Jewels M.D.   On: 06/02/2014 15:46    Medications  vancomycin (VANCOCIN) IVPB 1000 mg/200 mL premix (1,000 mg Intravenous New Bag/Given 06/02/14 1537)  morphine 4 MG/ML injection 4 mg (4 mg Intravenous Given 06/02/14 1628)  lidocaine-EPINEPHrine (XYLOCAINE W/EPI) 2 %-1:200000 (PF) injection 10 mL (10 mLs Other Given 06/02/14 1628)     MDM   Final diagnoses:  Dyspnea  Abscess of abdominal wall  Cellulitis of abdominal wall    Nursing notes including past medical history and social history reviewed and considered in documentation Labs/vital reviewed myself and considered during evaluation xrays/imaging reviewed by myself and considered during evaluation     Sharyon Cable, MD 06/02/14 1712

## 2014-06-02 NOTE — H&P (Signed)
[facility] History and Physical  [name]. HLK:[id number] DOB: 05-03-1935 DOA: 06/02/2014  Referring physician: Dr. Christy [name] PCP: Seth November, MD   Chief Complaint: abdominal redness and abscess  HPI: [name]. is a 79 y.o. male past medical history of aortic stenosis status post aortic valve replacement, chronic diastolic heart failure, chronic A. fib, status post pacemaker due to sick sinus syndrome on Coumadin, coronary artery sees status post CABG, diabetes mellitus with an unknown hemoglobin A1c, hyperlipidemia, essential hypertension and morbid obese that comes in for abdominal pain redness and tenderness to start it one week prior to admission he went and saw his primary care doctor 5 days prior to admission who started him on Doxy. But she progressively got worse. He relates he had some fevers so he came here to the ED. Relates no nausea vomiting.  In the ED: He had a 90 of the abdominal wall abscess. Chest x-ray was done that shows results below, CBC was done that shows a white count with a left shift, INR 3.6, he is mildly hyponatremic and hypochloremic. And we will consulted for further evaluation.  Review of Systems:  Constitutional:  No weight loss, night sweats. HEENT:  No headaches, Difficulty swallowing,Tooth/dental problems,Sore throat,  No sneezing, itching, ear ache, nasal congestion, post nasal drip,  Cardio-vascular:  No chest pain, Orthopnea, PND, swelling in lower extremities, anasarca, dizziness, palpitations  GI:  No heartburn, indigestion, abdominal pain, nausea, vomiting, diarrhea, change in bowel habits, loss of appetite  Resp:  No shortness of breath with exertion or at rest. No excess mucus, no productive cough, No non-productive cough, No coughing up of blood.No change in color of mucus.No wheezing.No chest wall deformity  Skin:  no rash or lesions.  GU:  no dysuria, change in color of urine, no urgency or frequency. No flank pain.   Musculoskeletal:  No joint pain or swelling. No decreased range of motion. No back pain.  Psych:  No change in mood or affect. No depression or anxiety. No memory loss.   Past Medical History  Diagnosis Date  . Erectile dysfunction   . Obesity   . OSA (obstructive sleep apnea)     not using CPAP  . History of nephrolithiasis   . HTN (hypertension)   . Aortal stenosis 2006    biophrostetic  . Atrial fibrillation     onset after CABG--- coumadin management at [facility] clinic  . S/P aortic valve replacement   . CHF (congestive heart failure)     abter CABG  . Pacemaker 2/07    VVI  . PVD (peripheral vascular disease)     CT angio showed- vascular insuff, intestine and RAS bilaterally, andgiogram 02/2009 severe PVD medical management  . Skin cancer     surgery (nose) 09-2010  . Sick sinus syndrome     [name]  . Hyperlipidemia   . Arthritis   . Gangrene   . Coronary artery disease   . DM type 2 (diabetes mellitus, type 2)     used to see Dr Meredith Pel, now f/u by Dr Larose Kells  . Memory loss    Past Surgical History  Procedure Laterality Date  . Aortic valve replacement      biophrostetic for Ao Stenosis 2006  . Coronary artery bypass graft  2006  . Tonsillectomy    . Cataract extraction, bilateral    . Amputation  01/2010    great toe  . Pacemaker placement  07/2005  Medtronic Sigma SR implanted by Dr Olevia Perches  . Aortic valve replacement  01/17/2011    S/P redo median sternotomy, extracorporeal circulation, redo Aortic Valve Replacement using a 23-mm Edwards pericardial Magna-Ease valve, Dr Ulla Gallo  . Angioplasty / stenting femoral  02/10/10    Left femoral  . Incise and drain abcess Right May 2014    right heel   . Eye surgery    . Abdominal aortagram N/A 11/10/2012    Procedure: ABDOMINAL Maxcine Ham;  Surgeon: Serafina Mitchell, MD;  Location: [facility] CATH LAB;  Service: Cardiovascular;  Laterality: N/A;  . Lower extremity angiogram Left 11/23/2012    Procedure:  LOWER EXTREMITY ANGIOGRAM;  Surgeon: Serafina Mitchell, MD;  Location: [facility] CATH LAB;  Service: Cardiovascular;  Laterality: Left;   Social History:  reports that he quit smoking about 25 years ago. His smoking use included Cigarettes. He smoked 0.00 packs per day. He has quit using smokeless tobacco. He reports that he does not drink alcohol or use illicit drugs.  Allergies  Allergen Reactions  . Potassium-Containing Compounds Anaphylaxis    IV--loss of conciousness  . Metoprolol Succinate Other (See Comments)    Extremely tired    Family History  Problem Relation Age of Onset  . Heart attack Father 45  . Heart disease Father     Before age 13  . Hypertension Father   . Stroke Mother 15  . Cancer Sister     Cervical  . Heart attack Sister   . Heart disease Sister   . Colon cancer Neg Hx   . Prostate cancer Neg Hx   . Diabetes Sister     2 sister w/ DM  . Heart attack Sister   . Heart attack Sister   . Heart disease Sister 35    AAA  and  Stomach Aneurysm  . Heart disease Sister     Before age 3  . Heart attack Sister 44     Prior to Admission medications   Medication Sig Start Date End Date Taking? Authorizing Provider  aspirin EC 81 MG tablet Take 81 mg by mouth daily. 09/26/10   Larey Dresser, MD  donepezil (ARICEPT) 10 MG tablet Take 1 tablet (10 mg total) by mouth at bedtime. 01/05/14   Kathrynn Ducking, MD  doxycycline (VIBRA-TABS) 100 MG tablet Take 1 tablet (100 mg total) by mouth 2 (two) times daily. 05/31/14   Colon Branch, MD  ezetimibe-simvastatin (VYTORIN) 10-40 MG per tablet Take 1 tablet by mouth at bedtime. 05/06/12   Larey Dresser, MD  furosemide (LASIX) 80 MG tablet Take 80 qam and every other day 40 qpm 02/22/14   Liliane Shi, PA-C  insulin glargine (LANTUS) 100 UNIT/ML injection Inject 65-80 Units into the skin at bedtime.     Historical Provider, MD  insulin glulisine (APIDRA) 100 UNIT/ML injection Inject 8-14 Units into the skin 3 (three) times daily with  meals. Sliding scale as directed    Historical Provider, MD  lisinopril (PRINIVIL,ZESTRIL) 5 MG tablet take 1 tablet by mouth once daily for high blood pressure 02/22/14   Liliane Shi, PA-C  spironolactone (ALDACTONE) 25 MG tablet take 1 tablet by mouth once daily 04/26/14   Larey Dresser, MD  warfarin (COUMADIN) 5 MG tablet Take as directed by anticoagulation clinic 07/18/13   Larey Dresser, MD   Physical Exam: Filed Vitals:   06/02/14 1337 06/02/14 1500 06/02/14 1600  BP: 153/64 161/73 138/58  Pulse: 81  67 62  Temp: 98.1 F (36.7 C)    TempSrc: Oral    Resp: 24    SpO2: 91% 93% 90%    Wt Readings from Last 3 Encounters:  05/31/14 101.776 kg (224 lb 6 oz)  05/15/14 105.235 kg (232 lb)  02/22/14 105.688 kg (233 lb)    General:  Appears calm and comfortable Eyes: PERRL, normal lids, irises & conjunctiva ENT: grossly normal hearing, lips & tongue Neck: no LAD, masses or thyromegaly Cardiovascular: RRR, no m/r/g. No LE edema. Telemetry: SR, no arrhythmias  Respiratory: CTA bilaterally, no w/r/r. Normal respiratory effort. Abdomen: soft, erythematous and indurated area warm to touch, about 4 cm in diameter. Now with an open wound. Skin: no rash or induration seen on limited exam Musculoskeletal: grossly normal tone BUE/BLE Psychiatric: grossly normal mood and affect, speech fluent and appropriate Neurologic: grossly non-focal.          Labs on Admission:  Basic Metabolic Panel:  Recent Labs Lab 06/02/14 1341  NA 134*  K 4.3  CL 95*  CO2 24  GLUCOSE 173*  BUN 20  CREATININE 1.14  CALCIUM 10.3   Liver Function Tests:  Recent Labs Lab 06/02/14 1341  AST 21  ALT 16  ALKPHOS 142*  BILITOT 1.0  PROT 8.3  ALBUMIN 3.0*   No results for input(s): LIPASE, AMYLASE in the last 168 hours. No results for input(s): AMMONIA in the last 168 hours. CBC:  Recent Labs Lab 06/02/14 1341  WBC 13.2*  NEUTROABS 9.8*  HGB 15.8  HCT 46.9  MCV 89.2  PLT 205    Cardiac Enzymes: No results for input(s): CKTOTAL, CKMB, CKMBINDEX, TROPONINI in the last 168 hours.  BNP (last 3 results) No results for input(s): PROBNP in the last 8760 hours. CBG: No results for input(s): GLUCAP in the last 168 hours.  Radiological Exams on Admission: Dg Chest Portable 1 View  06/02/2014   CLINICAL DATA:  Abdominal wound for 5 to 7 days. Fever and congestion.  EXAM: PORTABLE CHEST - 1 VIEW  COMPARISON:  08/19/2011  FINDINGS: The cardiac silhouette, mediastinal and hilar contours are slightly prominent but stable. There are chronic bronchitic and interstitial lung changes but no definite acute infiltrate or effusion.  IMPRESSION: Chronic lung changes without definite acute pulmonary findings.   Electronically Signed   By: Kalman Jewels M.D.   On: 06/02/2014 15:46    EKG: Independently reviewed. *none  Assessment/Plan Abscess of abdominal wall Cellulitis - Failed outpatient treatment, status post I&D, check blood cultures 2 start him on vancomycin and Rocephin. - We will get wound care to follow-up.    DM (diabetes mellitus) type II uncontrolled, periph vascular disorder: - Check a hemoglobin A1c. - Continue Lantus at current dose. Start him on sliding scale insulin.  OBESITY, MORBID - Counseling.  Essential hypertension - Continue current home medications. Will hold Lasix for 24 hours. As he is mildly hyponatremic.  Aortic valve stenosis, severe prosthetic - Coumadin per pharmacy goal INR 2.5-3.5.  Chronic diastolic heart failure - Seems to be euvolemic. Continue lisinopril and Aldactone. Resume Lasix in 24 hours.    Code Status: full DVT Prophylaxis: heparin Family Communication: sister Disposition Plan: inpatient  Time spent: 60 minutes  Charlynne Cousins [facility] Pager [phone]

## 2014-06-03 LAB — GLUCOSE, CAPILLARY
GLUCOSE-CAPILLARY: 72 mg/dL (ref 70–99)
GLUCOSE-CAPILLARY: 114 mg/dL — AB (ref 70–99)
GLUCOSE-CAPILLARY: 107 mg/dL — AB (ref 70–99)
GLUCOSE-CAPILLARY: 124 mg/dL — AB (ref 70–99)

## 2014-06-03 LAB — COMPREHENSIVE METABOLIC PANEL
ALT: 12 U/L (ref 0–53)
AST: 17 U/L (ref 0–37)
Albumin: 2.5 g/dL — ABNORMAL LOW (ref 3.5–5.2)
Alkaline Phosphatase: 120 U/L — ABNORMAL HIGH (ref 39–117)
Anion gap: 13 (ref 5–15)
BUN: 22 mg/dL (ref 6–23)
CO2: 25 meq/L (ref 19–32)
CREATININE: 1.17 mg/dL (ref 0.50–1.35)
Calcium: 9.6 mg/dL (ref 8.4–10.5)
Chloride: 97 mEq/L (ref 96–112)
GFR, EST AFRICAN AMERICAN: 68 mL/min — AB (ref >90)
GFR, EST NON AFRICAN AMERICAN: 58 mL/min — AB (ref >90)
GLUCOSE: 98 mg/dL (ref 70–99)
Potassium: 3.9 mEq/L (ref 3.7–5.3)
Sodium: 135 mEq/L — ABNORMAL LOW (ref 137–147)
Total Bilirubin: 0.5 mg/dL (ref 0.3–1.2)
Total Protein: 7 g/dL (ref 6.0–8.3)

## 2014-06-03 LAB — PROTIME-INR
INR: 3.86 — ABNORMAL HIGH (ref 0.00–1.49)
Prothrombin Time: 38.2 seconds — ABNORMAL HIGH (ref 11.6–15.2)

## 2014-06-03 LAB — CBC
HEMATOCRIT: 42 % (ref 39.0–52.0)
Hemoglobin: 13.6 g/dL (ref 13.0–17.0)
MCH: 28.8 pg (ref 26.0–34.0)
MCHC: 32.4 g/dL (ref 30.0–36.0)
MCV: 88.8 fL (ref 78.0–100.0)
PLATELETS: 191 10*3/uL (ref 150–400)
RBC: 4.73 MIL/uL (ref 4.22–5.81)
RDW: 13.7 % (ref 11.5–15.5)
WBC: 11 10*3/uL — ABNORMAL HIGH (ref 4.0–10.5)

## 2014-06-03 LAB — HEMOGLOBIN A1C
HEMOGLOBIN A1C: 10.7 % — AB (ref <5.7)
Mean Plasma Glucose: 260 mg/dL — ABNORMAL HIGH (ref <117)

## 2014-06-03 MED ORDER — INSULIN GLARGINE 100 UNIT/ML ~~LOC~~ SOLN
60.0000 [IU] | Freq: Every day | SUBCUTANEOUS | Status: DC
  Administered 2014-06-03: 60 [IU] via SUBCUTANEOUS
  Filled 2014-06-03 (×2): qty 0.6

## 2014-06-03 NOTE — Progress Notes (Signed)
[name] has refused his enema tonight stating " I had a good bowel movement this morning, like every morning".  He has agreed to take it in the morning after he wakes up.

## 2014-06-03 NOTE — Progress Notes (Addendum)
[facility] PROGRESS NOTE  [name]. [id number]:[id number] DOB: [birth year] DOA: 06/02/2014 PCP: [name], MD  Assessment/Plan: 1-Abdominal wall cellulitis, Abscess;  Continue with IV vancomycin and ceftriaxone.  Wound care consulted.  If no significant improvement will consult surgery.   2-Diabetes: change lantus to 60 units, to avoid hypoglycemia. SSI. HBA1 at 10, poor controlled.   3-Obesity;counseling provide.   Ileus vs SBO on x ray; patient had BM yesterday. Denies abdominal pain. Will repeat X ray in am.   HTN;on lasix. Spironolactone. Hold lisinopril.   Chronic diastolic HF; continue with lasix.   History of A fib, SP pericardial AVR replacement.  On coumadin.   Code Status: Full Code Family Communication: care discussed with patient.  Disposition Plan: Remain inpatient.    Consultants:  Wound care consult.   Procedures:  none  Antibiotics:  Vancomycin 12-11  Ceftriaxone 12-11  HPI/Subjective: Patient relates he had BM yesterday. Passing gas, no abdominal pain He doesn't remember for how long he has had erythema in the abdomen.   Objective: Filed Vitals:   06/03/14 0503  BP: 113/47  Pulse: 65  Temp: 97.9 F (36.6 C)  Resp: 20    Intake/Output Summary (Last 24 hours) at 06/03/14 0937 Last data filed at 06/03/14 0932  Gross per 24 hour  Intake    890 ml  Output    800 ml  Net     90 ml   Filed Weights   06/02/14 1836  Weight: 100.517 kg (221 lb 9.6 oz)    Exam:   General:  Alert in no distress.   Cardiovascular: S 1, S 2 RRR  Respiratory: CTA  Abdomen: BS present, soft, obese, 4 cm redness, small opening draining serous sanguineous fluid.   Musculoskeletal: trace edema.   Data Reviewed: Basic Metabolic Panel:  Recent Labs Lab 06/02/14 1341 06/03/14 0448  NA 134* 135*  K 4.3 3.9  CL 95* 97  CO2 24 25  GLUCOSE 173* 98  BUN 20 22  CREATININE 1.14 1.17  CALCIUM 10.3 9.6   Liver Function Tests:  Recent  Labs Lab 06/02/14 1341 06/03/14 0448  AST 21 17  ALT 16 12  ALKPHOS 142* 120*  BILITOT 1.0 0.5  PROT 8.3 7.0  ALBUMIN 3.0* 2.5*   No results for input(s): LIPASE, AMYLASE in the last 168 hours. No results for input(s): AMMONIA in the last 168 hours. CBC:  Recent Labs Lab 06/02/14 1341 06/03/14 0448  WBC 13.2* 11.0*  NEUTROABS 9.8*  --   HGB 15.8 13.6  HCT 46.9 42.0  MCV 89.2 88.8  PLT 205 191   Cardiac Enzymes: No results for input(s): CKTOTAL, CKMB, CKMBINDEX, TROPONINI in the last 168 hours. BNP (last 3 results) No results for input(s): PROBNP in the last 8760 hours. CBG:  Recent Labs Lab 06/02/14 1831 06/02/14 2153 06/03/14 0806  GLUCAP 214* 280* 72    No results found for this or any previous visit (from the past 240 hour(s)).   Studies: Dg Chest Portable 1 View  06/02/2014   CLINICAL DATA:  Abdominal wound for 5 to 7 days. Fever and congestion.  EXAM: PORTABLE CHEST - 1 VIEW  COMPARISON:  08/19/2011  FINDINGS: The cardiac silhouette, mediastinal and hilar contours are slightly prominent but stable. There are chronic bronchitic and interstitial lung changes but no definite acute infiltrate or effusion.  IMPRESSION: Chronic lung changes without definite acute pulmonary findings.   Electronically Signed   By: Kalman Jewels M.D.   On:  06/02/2014 15:46   Dg Abd 2 Views  06/02/2014   CLINICAL DATA:  Nausea, vomiting.  EXAM: ABDOMEN - 2 VIEW  COMPARISON:  None.  FINDINGS: Mildly dilated small bowel loops are noted which may represent ileus or possible partial distal small bowel obstruction. Atherosclerotic calcifications are noted in the pelvis. There is no evidence of pneumoperitoneum. Possible left renal calculus is noted.  IMPRESSION: Mildly dilated small bowel loops are noted which may represent ileus or possibly distal small bowel obstruction. Followup radiographs are recommended.   Electronically Signed   By: Sabino Dick M.D.   On: 06/02/2014 18:01     Scheduled Meds: . aspirin EC  81 mg Oral Daily  . cefTRIAXone (ROCEPHIN)  IV  2 g Intravenous Q24H  . donepezil  10 mg Oral QHS  . ezetimibe-simvastatin  1 tablet Oral QHS  . furosemide  40 mg Oral BID  . insulin aspart  0-15 Units Subcutaneous TID WC  . insulin aspart  4 Units Subcutaneous TID WC  . insulin glargine  65 Units Subcutaneous QHS  . sodium chloride  3 mL Intravenous Q12H  . spironolactone  25 mg Oral Daily  . vancomycin  1,000 mg Intravenous Q12H  . Warfarin - Pharmacist Dosing Inpatient   Does not apply q1800   Continuous Infusions:   Principal Problem:   Cellulitis Active Problems:   DM (diabetes mellitus) type II uncontrolled, periph vascular disorder   OBESITY, MORBID   Essential hypertension   Aortic valve stenosis, severe prosthetic   Chronic diastolic heart failure   Abscess of abdominal wall   Abdominal wall abscess   Hyponatremia    Time spent: 35 minutes.     Niel Hummer A  [facility] Pager [phone]. If 7PM-7AM, please contact night-coverage at [email or web address], password [email or web address] 06/03/2014, 9:37 AM  LOS: 1 day

## 2014-06-03 NOTE — Evaluation (Signed)
Physical Therapy Evaluation Patient Details Name: [name]. MRN: [id number] DOB: 02/28/1935 Today's Date: 06/03/2014   History of Present Illness  [name]. is a 79 y.o. male past medical history of aortic stenosis status post aortic valve replacement, chronic diastolic heart failure, chronic A. fib, status post pacemaker due to sick sinus syndrome on Coumadin, coronary artery sees status post CABG, diabetes mellitus with an unknown hemoglobin A1c, hyperlipidemia, essential hypertension and morbid obese that comes in for abdominal pain redness and tenderness that started one week prior to admission.  He went and saw his primary care doctor 5 days prior to admission who started him on Doxy. But he progressively got worse. He relates he had some fevers so he came here to the ED. Relates no nausea vomiting.  Treatment with Vanc progressing.  Clinical Impression  Pt admitted with/for abdominal wall cellulitis/abscess and fever.  Pt currently limited functionally due to the problems listed below.  (see problems list.)  Pt will benefit from PT to maximize function and safety to be able to get home safely with available assist of family.     Follow Up Recommendations No PT follow up    Equipment Recommendations  None recommended by PT    Recommendations for Other Services       Precautions / Restrictions Precautions Precautions: Fall      Mobility  Bed Mobility Overal bed mobility: Modified Independent             General bed mobility comments: Got up with little effort  Transfers Overall transfer level: Needs assistance Equipment used: None Transfers: Sit to/from Stand Sit to Stand: Supervision            Ambulation/Gait Ambulation/Gait assistance: Supervision Ambulation Distance (Feet): 300 Feet Assistive device: None Gait Pattern/deviations: Step-through pattern Gait velocity: moderate, but with little variability   General Gait Details: mildly  unsteady gait which worsened mildly with distance and fatigue.  Stairs Stairs: Yes Stairs assistance: Supervision Stair Management: One rail Right;Step to pattern;Forwards Number of Stairs: 2 (limited by lines) General stair comments: safe with rail  Wheelchair Mobility    Modified Rankin (Stroke Patients Only)       Balance Overall balance assessment: Needs assistance Sitting-balance support: Feet supported;No upper extremity supported Sitting balance-Leahy Scale: Good     Standing balance support: No upper extremity supported Standing balance-Leahy Scale: Fair                               Pertinent Vitals/Pain Pain Assessment: No/denies pain    Home Living Family/patient expects to be discharged to:: Private residence Living Arrangements: Spouse/significant other (and grandson) Available Help at Discharge: Family Type of Home: House Home Access: Level entry     Home Layout: One level Home Equipment: Other (comment);Walker - 2 wheels (Tub seat)      Prior Function Level of Independence: Independent               Hand Dominance        Extremity/Trunk Assessment   Upper Extremity Assessment: Overall WFL for tasks assessed           Lower Extremity Assessment: Overall WFL for tasks assessed (mild weaknesses bil, but functional)         Communication   Communication: No difficulties  Cognition Arousal/Alertness: Awake/alert Behavior During Therapy: WFL for tasks assessed/performed Overall Cognitive Status: No family/caregiver present to determine baseline cognitive  functioning       Memory: Decreased short-term memory              General Comments      Exercises        Assessment/Plan    PT Assessment Patient needs continued PT services  PT Diagnosis Difficulty walking   PT Problem List Decreased strength;Decreased activity tolerance;Decreased balance;Decreased mobility;Decreased safety awareness  PT Treatment  Interventions Gait training;Functional mobility training;Therapeutic activities;Balance training;Patient/family education   PT Goals (Current goals can be found in the Care Plan section) Acute Rehab PT Goals Patient Stated Goal: none stated.Marland KitchenMarland KitchenI don't even know why I'm here. PT Goal Formulation: With patient Time For Goal Achievement: 06/10/14 Potential to Achieve Goals: Good    Frequency Min 2X/week   Barriers to discharge        Co-evaluation               End of Session   Activity Tolerance: Patient tolerated treatment well Patient left: in bed;with call bell/phone within reach Nurse Communication: Mobility status         Time: 1937-9024 PT Time Calculation (min) (ACUTE ONLY): 15 min   Charges:   PT Evaluation $Initial PT Evaluation Tier I: 1 Procedure PT Treatments $Gait Training: 8-22 mins   PT G Codes:          Josua Ferrebee, Tessie Fass 06/03/2014, 3:56 PM 06/03/2014  Donnella Sham, [place] [phone]  (pager)

## 2014-06-03 NOTE — Progress Notes (Signed)
ANTICOAGULATION CONSULT NOTE - Follow Up Consult  Pharmacy Consult for Warfarin Indication: Aortic mechanical valve replacement  Allergies  Allergen Reactions  . Potassium-Containing Compounds Anaphylaxis    IV--loss of conciousness  . Metoprolol Succinate Other (See Comments)    Extremely tired    Patient Measurements: Height: 5\' 10"  (177.8 cm) Weight: 221 lb 9.6 oz (100.517 kg) IBW/kg (Calculated) : 73   Vital Signs: Temp: 97.9 F (36.6 C) (12/12 0503) Temp Source: Oral (12/12 0503) BP: 113/47 mmHg (12/12 0503) Pulse Rate: 65 (12/12 0503)  Labs:  Recent Labs  06/02/14 1341 06/02/14 1509 06/03/14 0448  HGB 15.8  --  13.6  HCT 46.9  --  42.0  PLT 205  --  191  LABPROT  --  36.2* 38.2*  INR  --  3.Seth* 3.86*  CREATININE 1.14  --  1.17    Estimated Creatinine Clearance: 62.8 mL/min (by C-G formula based on Cr of 1.17).   Assessment: [name] receiving vancomycin and ceftriaxone for abscess of abdominal wall cellulitis on warfarin PTA for mechanical aortic valve (INR Goal 2.5-3.5).  Documented home dose 5 mg on Tuesday and Thursday and 7.5 mg all other days. INR on admission was 3.Seth. He received 2.5 mg  once last night and INR trended up to 3.86. CBC and platelets WNL. No signs of bleeding noted.  Goal of Therapy:  INR 2.5-3.5 Monitor platelets by anticoagulation protocol: Yes   Plan:  1) Hold warfarin X 1 tonight 2) Monitor daily PT/INR, signs of bleeding  Theron Arista, PharmD Clinical Pharmacist - Resident Pager: [phone] 12/12/201510:25 AM

## 2014-06-03 NOTE — Progress Notes (Signed)
[name] has been educated about the need to call for staff help when getting out of bed.  He denies the need for help and refuses to call for assistance.  He states that he gets around fine and isn't an invalid.  The bed alarm was activated and he verbalizes understanding of the reasons we want a staff member in the room while ambulating to the bathroom.  He refuses to allow staff in the bathroom while he urinates. He also refused to allow me to apply any oxygen for his saturations of mid 90's.  He is alert and oriented answering all questions appropriately.  While ambulating to the bathroom, his gait was stable but a limp was noticed.  The risks of a fall while on coumadin were reinforced and he voiced understanding.

## 2014-06-04 ENCOUNTER — Inpatient Hospital Stay (HOSPITAL_COMMUNITY): Payer: Medicare Other

## 2014-06-04 LAB — BASIC METABOLIC PANEL
ANION GAP: 14 (ref 5–15)
BUN: 18 mg/dL (ref 6–23)
CALCIUM: 9.4 mg/dL (ref 8.4–10.5)
CO2: 25 mEq/L (ref 19–32)
Chloride: 97 mEq/L (ref 96–112)
Creatinine, Ser: 0.97 mg/dL (ref 0.50–1.35)
GFR calc Af Amer: 90 mL/min — ABNORMAL LOW (ref >90)
GFR, EST NON AFRICAN AMERICAN: 78 mL/min — AB (ref >90)
Glucose, Bld: 51 mg/dL — ABNORMAL LOW (ref 70–99)
POTASSIUM: 3.6 meq/L — AB (ref 3.7–5.3)
SODIUM: 136 meq/L — AB (ref 137–147)

## 2014-06-04 LAB — GLUCOSE, CAPILLARY
GLUCOSE-CAPILLARY: 50 mg/dL — AB (ref 70–99)
Glucose-Capillary: 88 mg/dL (ref 70–99)
Glucose-Capillary: 144 mg/dL — ABNORMAL HIGH (ref 70–99)
Glucose-Capillary: 183 mg/dL — ABNORMAL HIGH (ref 70–99)
Glucose-Capillary: 164 mg/dL — ABNORMAL HIGH (ref 70–99)

## 2014-06-04 LAB — PROTIME-INR
INR: 2.44 — ABNORMAL HIGH (ref 0.00–1.49)
PROTHROMBIN TIME: 26.7 s — AB (ref 11.6–15.2)

## 2014-06-04 LAB — CBC
HCT: 43.8 % (ref 39.0–52.0)
Hemoglobin: 14.5 g/dL (ref 13.0–17.0)
MCH: 29.3 pg (ref 26.0–34.0)
MCHC: 33.1 g/dL (ref 30.0–36.0)
MCV: 88.5 fL (ref 78.0–100.0)
PLATELETS: 204 10*3/uL (ref 150–400)
RBC: 4.95 MIL/uL (ref 4.22–5.81)
RDW: 13.4 % (ref 11.5–15.5)
WBC: 9.7 10*3/uL (ref 4.0–10.5)

## 2014-06-04 LAB — VANCOMYCIN, TROUGH: VANCOMYCIN TR: 13.8 ug/mL (ref 10.0–20.0)

## 2014-06-04 LAB — MRSA PCR SCREENING: MRSA BY PCR: NEGATIVE

## 2014-06-04 MED ORDER — INSULIN GLARGINE 100 UNIT/ML ~~LOC~~ SOLN: 45.0000 [IU] | Freq: Every day | SUBCUTANEOUS | Status: DC

## 2014-06-04 MED ORDER — POLYETHYLENE GLYCOL 3350 17 G PO PACK
17.0000 g | PACK | Freq: Two times a day (BID) | ORAL | Status: DC
Start: 2014-06-04 — End: 2014-06-07
  Administered 2014-06-04 – 2014-06-07 (×7): 17 g via ORAL
  Filled 2014-06-04 (×9): qty 1

## 2014-06-04 MED ORDER — WARFARIN SODIUM 5 MG PO TABS
5.0000 mg | ORAL_TABLET | Freq: Once | ORAL | Status: AC
  Administered 2014-06-04: 5 mg via ORAL
  Filled 2014-06-04 (×2): qty 1

## 2014-06-04 MED ORDER — INSULIN GLARGINE 100 UNIT/ML ~~LOC~~ SOLN
40.0000 [IU] | Freq: Every day | SUBCUTANEOUS | Status: DC
  Administered 2014-06-04: 40 [IU] via SUBCUTANEOUS
  Filled 2014-06-04 (×2): qty 0.4

## 2014-06-04 NOTE — Progress Notes (Addendum)
ANTICOAGULATION CONSULT NOTE - Follow Up Consult  Pharmacy Consult for Warfarin Indication: Aortic mechanical valve replacement  Allergies  Allergen Reactions  . Potassium-Containing Compounds Anaphylaxis    IV--loss of conciousness  . Metoprolol Succinate Other (See Comments)    Extremely tired    Patient Measurements: Height: 5\' 10"  (177.8 cm) Weight: 221 lb 9.6 oz (100.517 kg) IBW/kg (Calculated) : 73   Vital Signs: Temp: 97.6 F (36.4 C) (12/13 0531) Temp Source: Oral (12/13 0531) BP: 125/54 mmHg (12/13 0531) Pulse Rate: 60 (12/13 0531)  Labs:  Recent Labs  06/02/14 1341 06/02/14 1509 06/03/14 0448 06/04/14 0534  HGB 15.8  --  13.6  --   HCT 46.9  --  42.0  --   PLT 205  --  191  --   LABPROT  --  36.2* 38.2* 26.7*  INR  --  3.60* 3.86* 2.44*  CREATININE 1.14  --  1.17  --     Estimated Creatinine Clearance: 62.8 mL/min (by C-G formula based on Cr of 1.17).   Assessment: 59 yoM receiving vancomycin and ceftriaxone for abscess of abdominal wall cellulitis on warfarin PTA for mechanical aortic valve (INR Goal 2.5-3.5).  Documented home dose 5 mg on Tuesday and Thursday and 7.5 mg all other days. INR on admission was 3.60 and trended up to 3.86 after 2.5 mg dose. Warfarin was held yesterday and today INR is supratherapeutic at 2.44.  CBC and platelets WNL. No signs of bleeding noted.  Likely can restart home regimen, but will give 5 mg tonight since there have been such variations in INR- may be labile.  Goal of Therapy:  INR 2.5-3.5 Monitor platelets by anticoagulation protocol: Yes   Plan:  1) Give warfarin 5 mg once tonight 2) Monitor daily PT/INR, signs of bleeding  Theron Arista, PharmD Clinical Pharmacist - Resident Pager: [phone] 12/13/20158:56 AM         =====================================   Addendum: - VT = 13.8 mcg/mL, therapeutic for cellulitis and expect vanc to accumulate further - renal function relatively stable  Vanc  12/11 >> Ceftriaxone 12/11 >>  12/11 BCx x2 -    Plan: - Continue vanc 1gm IV Q12H - Monitor renal fxn, clinical progress, vanc trough as indicated    Vash Quezada D. Mina Marble, PharmD, BCPS Pager:  [phone] 06/04/2014, 3:51 PM

## 2014-06-04 NOTE — Progress Notes (Signed)
Patient noted to have increased purlent drainage on abdominal dressing.  Assessed wound with MD.  Full thickness wound irrigated with N/S after light pressure applied to exude mod. amt of purulent / sanguinous drainage.  [place] nurse to assess wound and make recommendations for treatment.

## 2014-06-04 NOTE — Progress Notes (Signed)
[facility] PROGRESS NOTE  [name]. [id number]:[id number] DOB: [birth year] DOA: 06/02/2014 PCP: [name], MD  Assessment/Plan: 1-Abdominal wall cellulitis, Abscess;  Continue with IV vancomycin and ceftriaxone day 2.  Wound care consulted. Local care, need wound packing.  Redness, edema improving. Continue to drain purulent material.   2-Diabetes: SSI. HBA1 at 10, poor controlled. Will decrease lantus to 40 units due to hypoglycemia.   3-Obesity;counseling provide.   Ileus vs SBO on x ray; patient had BM . Denies abdominal pain. X ray improved dilation. Start miralax.   HTN;on lasix. Spironolactone. Hold lisinopril.   Chronic diastolic HF; continue with lasix.   History of A fib, SP pericardial AVR replacement.  On coumadin.   Code Status: Full Code Family Communication: care discussed with patient.  Disposition Plan: Remain inpatient.    Consultants:  Wound care consult.   Procedures:  none  Antibiotics:  Vancomycin 12-11  Ceftriaxone 12-11  HPI/Subjective: No complaints, had BM yesterday, no abdominal pain.   Objective: Filed Vitals:   06/04/14 0531  BP: 125/54  Pulse: 60  Temp: 97.6 F (36.4 C)  Resp: 20    Intake/Output Summary (Last 24 hours) at 06/04/14 1214 Last data filed at 06/04/14 1121  Gross per 24 hour  Intake    980 ml  Output   1375 ml  Net   -395 ml   Filed Weights   06/02/14 1836  Weight: 100.517 kg (221 lb 9.6 oz)    Exam:   General:  Alert in no distress.   Cardiovascular: S 1, S 2 RRR  Respiratory: CTA  Abdomen: BS present, soft, obese, redness has decreases, no spreading, draining still purulent material.   Musculoskeletal: trace edema.   Data Reviewed: Basic Metabolic Panel:  Recent Labs Lab 06/02/14 1341 06/03/14 0448 06/04/14 0830  NA 134* 135* 136*  K 4.3 3.9 3.6*  CL 95* 97 97  CO2 24 25 25   GLUCOSE 173* 98 51*  BUN 20 22 18   CREATININE 1.14 1.17 0.97  CALCIUM 10.3 9.6 9.4   Liver  Function Tests:  Recent Labs Lab 06/02/14 1341 06/03/14 0448  AST 21 17  ALT 16 12  ALKPHOS 142* 120*  BILITOT 1.0 0.5  PROT 8.3 7.0  ALBUMIN 3.0* 2.5*   No results for input(s): LIPASE, AMYLASE in the last 168 hours. No results for input(s): AMMONIA in the last 168 hours. CBC:  Recent Labs Lab 06/02/14 1341 06/03/14 0448 06/04/14 0830  WBC 13.2* 11.0* 9.7  NEUTROABS 9.8*  --   --   HGB 15.8 13.6 14.5  HCT 46.9 42.0 43.8  MCV 89.2 88.8 88.5  PLT 205 191 204   Cardiac Enzymes: No results for input(s): CKTOTAL, CKMB, CKMBINDEX, TROPONINI in the last 168 hours. BNP (last 3 results) No results for input(s): PROBNP in the last 8760 hours. CBG:  Recent Labs Lab 06/03/14 1715 06/03/14 2126 06/04/14 0824 06/04/14 0905 06/04/14 1200  GLUCAP 107* 124* 50* 88 144*    No results found for this or any previous visit (from the past 240 hour(s)).   Studies: Dg Abd 1 View  06/04/2014   CLINICAL DATA:  Followup ileus.  EXAM: ABDOMEN - 1 VIEW  COMPARISON:  Two-view abdomen x-ray 06/02/2014. CTA aortobifemoral 09/18/2005.  FINDINGS: Interval improvement in the mildly distended small bowel loops in the mid abdomen since yesterday, though 102 mildly distended loops persist. Interval improvement in the mildly dilated sigmoid colon. Moderate to large stool burden throughout the colon. No suggestion  of free intraperitoneal air on the supine image. Calcification in the left mid abdomen corresponds to a cortical calcification in the left kidney identified on prior CT.  IMPRESSION: Improving ileus, though there are persistent mildly distended loops of small bowel in the mid abdomen.   Electronically Signed   By: Evangeline Dakin M.D.   On: 06/04/2014 08:50   Dg Chest Portable 1 View  06/02/2014   CLINICAL DATA:  Abdominal wound for 5 to 7 days. Fever and congestion.  EXAM: PORTABLE CHEST - 1 VIEW  COMPARISON:  08/19/2011  FINDINGS: The cardiac silhouette, mediastinal and hilar contours are  slightly prominent but stable. There are chronic bronchitic and interstitial lung changes but no definite acute infiltrate or effusion.  IMPRESSION: Chronic lung changes without definite acute pulmonary findings.   Electronically Signed   By: Kalman Jewels M.D.   On: 06/02/2014 15:46   Dg Abd 2 Views  06/02/2014   CLINICAL DATA:  Nausea, vomiting.  EXAM: ABDOMEN - 2 VIEW  COMPARISON:  None.  FINDINGS: Mildly dilated small bowel loops are noted which may represent ileus or possible partial distal small bowel obstruction. Atherosclerotic calcifications are noted in the pelvis. There is no evidence of pneumoperitoneum. Possible left renal calculus is noted.  IMPRESSION: Mildly dilated small bowel loops are noted which may represent ileus or possibly distal small bowel obstruction. Followup radiographs are recommended.   Electronically Signed   By: Sabino Dick M.D.   On: 06/02/2014 18:01    Scheduled Meds: . aspirin EC  81 mg Oral Daily  . cefTRIAXone (ROCEPHIN)  IV  2 g Intravenous Q24H  . donepezil  10 mg Oral QHS  . ezetimibe-simvastatin  1 tablet Oral QHS  . furosemide  40 mg Oral BID  . insulin aspart  0-15 Units Subcutaneous TID WC  . insulin aspart  4 Units Subcutaneous TID WC  . insulin glargine  40 Units Subcutaneous QHS  . sodium chloride  3 mL Intravenous Q12H  . spironolactone  25 mg Oral Daily  . vancomycin  1,000 mg Intravenous Q12H  . warfarin  5 mg Oral ONCE-1800  . Warfarin - Pharmacist Dosing Inpatient   Does not apply q1800   Continuous Infusions:   Principal Problem:   Cellulitis Active Problems:   DM (diabetes mellitus) type II uncontrolled, periph vascular disorder   OBESITY, MORBID   Essential hypertension   Aortic valve stenosis, severe prosthetic   Chronic diastolic heart failure   Abscess of abdominal wall   Abdominal wall abscess   Hyponatremia    Time spent: 35 minutes.     Niel Hummer A  [facility] Pager [phone]. If 7PM-7AM, please  contact night-coverage at [email or web address], password [facility] 06/04/2014, 12:14 PM  LOS: 2 days

## 2014-06-04 NOTE — Progress Notes (Signed)
CRITICAL VALUE ALERT  Critical value received:  56  Date of notification:  06/04/2014  Time of notification:  0745  Critical value read back: yes  Nurse who received alert:  Hiram Gash, RN  MD notified (1st page):  Dr. Tyrell Antonio  Time of first page:  0830  MD notified (2nd page):  Time of second page:  Responding MD:  Dr. Tyrell Antonio  Time MD responded:  [phone]

## 2014-06-04 NOTE — Consult Note (Signed)
WOC wound consult note Reason for Consult: Abdominal abscess with spontaneous rupture. Wound type:infectious Pressure Ulcer POA: No Measurement:1.5cm x 0.5cm x 2.5cm Wound bed:red, moist Drainage (amount, consistency, odor) serosanguinous, no odor Periwound:intact with periwound erythema that is resolving according to bedside RN and family. Currently measures 2cm circumferentially. Dressing procedure/placement/frequency: I will implement a POC that includes cleansing/irrigating wound, then filling of dead space (defect) with an antimicrobial gauze packing strip (iodoform). This will be performed twice daily for 21 days or until healed. Patient will be best served by following up with his PCP in the community.[place] nursing team will not follow, but will remain available to this patient, the nursing and medical teams.  Please re-consult if needed. Thanks, Maudie Flakes, MSN, RN, [place], [place], [place] [phone])

## 2014-06-05 ENCOUNTER — Telehealth: Payer: Self-pay | Admitting: Internal Medicine

## 2014-06-05 ENCOUNTER — Ambulatory Visit: Payer: Medicare Other | Admitting: Internal Medicine

## 2014-06-05 LAB — BASIC METABOLIC PANEL
ANION GAP: 11 (ref 5–15)
BUN: 17 mg/dL (ref 6–23)
CALCIUM: 9.7 mg/dL (ref 8.4–10.5)
CHLORIDE: 99 meq/L (ref 96–112)
CO2: 29 mEq/L (ref 19–32)
CREATININE: 0.95 mg/dL (ref 0.50–1.35)
GFR calc Af Amer: >90 mL/min (ref >90)
GFR calc non Af Amer: 78 mL/min — ABNORMAL LOW (ref >90)
GLUCOSE: 52 mg/dL — AB (ref 70–99)
Potassium: 3.7 mEq/L (ref 3.7–5.3)
Sodium: 139 mEq/L (ref 137–147)

## 2014-06-05 LAB — GLUCOSE, CAPILLARY
GLUCOSE-CAPILLARY: 137 mg/dL — AB (ref 70–99)
GLUCOSE-CAPILLARY: 108 mg/dL — AB (ref 70–99)
Glucose-Capillary: 54 mg/dL — ABNORMAL LOW (ref 70–99)
Glucose-Capillary: 203 mg/dL — ABNORMAL HIGH (ref 70–99)
Glucose-Capillary: 250 mg/dL — ABNORMAL HIGH (ref 70–99)

## 2014-06-05 LAB — PROTIME-INR
INR: 1.83 — ABNORMAL HIGH (ref 0.00–1.49)
Prothrombin Time: 21.4 seconds — ABNORMAL HIGH (ref 11.6–15.2)

## 2014-06-05 LAB — CBC
HEMATOCRIT: 44.1 % (ref 39.0–52.0)
HEMOGLOBIN: 14.8 g/dL (ref 13.0–17.0)
MCH: 29.6 pg (ref 26.0–34.0)
MCHC: 33.6 g/dL (ref 30.0–36.0)
MCV: 88.2 fL (ref 78.0–100.0)
PLATELETS: 229 10*3/uL (ref 150–400)
RBC: 5 MIL/uL (ref 4.22–5.81)
RDW: 13.3 % (ref 11.5–15.5)
WBC: 10.6 10*3/uL — AB (ref 4.0–10.5)

## 2014-06-05 MED ORDER — WARFARIN SODIUM 10 MG PO TABS
10.0000 mg | ORAL_TABLET | Freq: Once | ORAL | Status: AC
  Administered 2014-06-05: 10 mg via ORAL
  Filled 2014-06-05: qty 1

## 2014-06-05 MED ORDER — INSULIN GLARGINE 100 UNIT/ML ~~LOC~~ SOLN
25.0000 [IU] | Freq: Every day | SUBCUTANEOUS | Status: DC
  Administered 2014-06-05: 25 [IU] via SUBCUTANEOUS
  Filled 2014-06-05 (×2): qty 0.25

## 2014-06-05 NOTE — Progress Notes (Signed)
Medicare Important Message given?  YES (If response is "NO", the following Medicare IM given date fields will be blank) Date Medicare IM given:  06/05/14 Medicare IM given by:  Tomi Bamberger

## 2014-06-05 NOTE — Progress Notes (Signed)
ANTICOAGULATION CONSULT NOTE - Follow Up Consult  Pharmacy Consult for Warfarin Indication: Bioprosthetic aortic valve and hx Afib  Allergies  Allergen Reactions  . Potassium-Containing Compounds Anaphylaxis    IV--loss of conciousness  . Metoprolol Succinate Other (See Comments)    Extremely tired    Patient Measurements: Height: 5\' 10"  (177.8 cm) Weight: 221 lb 9.6 oz (100.517 kg) IBW/kg (Calculated) : 73  Vital Signs: Temp: 97.5 F (36.4 C) (12/14 1400) Temp Source: Oral (12/14 1400) BP: 150/61 mmHg (12/14 1400) Pulse Rate: 64 (12/14 1400)  Labs:  Recent Labs  06/03/14 0448 06/04/14 0534 06/04/14 0830 06/05/14 0645  HGB 13.6  --  14.5 14.8  HCT 42.0  --  43.8 44.1  PLT 191  --  204 229  LABPROT 38.2* 26.7*  --  21.4*  INR 3.86* 2.44*  --  1.83*  CREATININE 1.17  --  0.97 0.95    Estimated Creatinine Clearance: 77.4 mL/min (by C-G formula based on Cr of 0.95).   Assessment: 38 YOM resumed on warfarin from PTA for hx Afib/bioprosthetic aortic valve replacement with a slightly SUBtherapeutic INR this morning (INR 1.83 << 2.44, goal of 2-3). CBC wnl - no overt s/sx of bleeding noted.  PTA dose 7.5 mg daily EXCEPT for 5 mg on Tues/Thurs  Goal of Therapy:  INR 2-3 (verified with coumadin clinic notes)   Plan:  1. Warfarin 10 mg x 1 dose at 1800 today 2. Will continue to monitor for any signs/symptoms of bleeding and will follow up with PT/INR in the a.m.   Alycia Rossetti, PharmD, BCPS Clinical Pharmacist Pager: [phone] 06/05/2014 2:29 PM

## 2014-06-05 NOTE — Progress Notes (Signed)
CRITICAL VALUE ALERT  Critical value received:  CBG 54  Date of notification:  06/05/2014  Time of notification:  0805  Critical value read back: yes  Nurse who received alert:  Tonny Bollman, RN  MD notified (1st page):  [place]  Time of first page:  0809  MD notified (2nd page):  Time of second page:  Responding MD:    Time MD responded:

## 2014-06-05 NOTE — Care Management Note (Unsigned)
    Page 1 of 1   06/05/2014     5:07:29 PM CARE MANAGEMENT NOTE 06/05/2014  Patient:  [name], [name]   Account Number:  [id number]  Date Initiated:  06/05/2014  Documentation initiated by:  Tomi Bamberger  Subjective/Objective Assessment:   dx abd wall abscess  admit- lives with spouse     Action/Plan:   Anticipated DC Date:  06/06/2014   Anticipated DC Plan:  [place]  In-house referral  Clinical Social Worker      DC Planning Services  CM consult      Choice offered to / List presented to:             Status of service:  In process, will continue to follow Medicare Important Message given?  YES (If response is "NO", the following Medicare IM given date fields will be blank) Date Medicare IM given:  06/05/2014 Medicare IM given by:  Tomi Bamberger Date Additional Medicare IM given:   Additional Medicare IM given by:    Discharge Disposition:    Per UR Regulation:  Reviewed for med. necessity/level of care/duration of stay  If discussed at [place] of Stay Meetings, dates discussed:    Comments:  06/05/14  Russell, BSN [phone] patient 's daughter stated they will not have anyone at home to help with dressing changes and spouse state she will not be able to do it, NCM explained that [facility] services can not do BID dressing changes , there will have to be somone in the home they can teach how to do the dressing. Daughter , [name] states they would like to get patient to a facility.  NCM called and left message for wife to return call.  NCM spoke with patient and he states to let the CSW know to go ahead and fax his information out to facilities. NCM informed CSW.  NCM received call from spouse, she states that she can not do dressing, she begins to feel sick, NCM explained that hh services can not come out twice a day to do dressing, and that patient states he would go to snf, wife states she would like to talk to patient tomorrow to make sure this  is what he wants to do and to let the CSW know to go ahead and fax out information.  Wife asks if they want to change their mind and go home woud this be a problem? NCM informed her that this would not be a problem we would just set up hh services.

## 2014-06-05 NOTE — Progress Notes (Signed)
[facility] PROGRESS NOTE  [name]. IZT:[id number] DOB: 11-Jul-1934 DOA: 06/02/2014 PCP: [name], MD  Assessment/Plan: 1-Abdominal wall cellulitis, Abscess;  Continue with IV vancomycin and ceftriaxone day 3.  Wound care consulted. Continue with dressing changes BID, see wound care note.  Redness, edema improving. Follow WBC.   2-Diabetes: SSI. HBA1 at 10, poor controlled. Will decrease lantus further to 25 units , due to hypoglycemia.   3-Obesity;counseling provided.   Ileus vs SBO on x ray; Denies abdominal pain. X ray improved dilation. Continue with miralax.   HTN;on lasix. Spironolactone. Hold lisinopril.   Chronic diastolic HF; continue with lasix.   History of A fib, SP pericardial AVR replacement.  On coumadin.   Code Status: Full Code Family Communication: care discussed with wife 12-13. Disposition Plan: Remain inpatient.    Consultants:  Wound care consult.   Procedures:  none  Antibiotics:  Vancomycin 12-11  Ceftriaxone 12-11  HPI/Subjective: Doing ok, had BM this morning, no abdominal pain.   Objective: Filed Vitals:   06/05/14 1400  BP: 150/61  Pulse: 64  Temp: 97.5 F (36.4 C)  Resp: 14    Intake/Output Summary (Last 24 hours) at 06/05/14 1404 Last data filed at 06/05/14 0847  Gross per 24 hour  Intake    760 ml  Output   1426 ml  Net   -666 ml   Filed Weights   06/02/14 1836  Weight: 100.517 kg (221 lb 9.6 oz)    Exam:   General:  Alert in no distress.   Cardiovascular: S 1, S 2 RRR  Respiratory: CTA  Abdomen: BS present, soft, obese, redness has decreases,  draining still purulent material.   Musculoskeletal: trace edema.   Data Reviewed: Basic Metabolic Panel:  Recent Labs Lab 06/02/14 1341 06/03/14 0448 06/04/14 0830 06/05/14 0645  NA 134* 135* 136* 139  K 4.3 3.9 3.6* 3.7  CL 95* 97 97 99  CO2 24 25 25 29   GLUCOSE 173* 98 51* 52*  BUN 20 22 18 17   CREATININE 1.14 1.17 0.97 0.95   CALCIUM 10.3 9.6 9.4 9.7   Liver Function Tests:  Recent Labs Lab 06/02/14 1341 06/03/14 0448  AST 21 17  ALT 16 12  ALKPHOS 142* 120*  BILITOT 1.0 0.5  PROT 8.3 7.0  ALBUMIN 3.0* 2.5*   No results for input(s): LIPASE, AMYLASE in the last 168 hours. No results for input(s): AMMONIA in the last 168 hours. CBC:  Recent Labs Lab 06/02/14 1341 06/03/14 0448 06/04/14 0830 06/05/14 0645  WBC 13.2* 11.0* 9.7 10.6*  NEUTROABS 9.8*  --   --   --   HGB 15.8 13.6 14.5 14.8  HCT 46.9 42.0 43.8 44.1  MCV 89.2 88.8 88.5 88.2  PLT 205 191 204 229   Cardiac Enzymes: No results for input(s): CKTOTAL, CKMB, CKMBINDEX, TROPONINI in the last 168 hours. BNP (last 3 results) No results for input(s): PROBNP in the last 8760 hours. CBG:  Recent Labs Lab 06/04/14 1810 06/04/14 2128 06/05/14 0800 06/05/14 0824 06/05/14 1200  GLUCAP 183* 164* 54* 137* 203*    Recent Results (from the past 240 hour(s))  Culture, blood (routine x 2)     Status: None (Preliminary result)   Collection Time: 06/02/14  7:50 PM  Result Value Ref Range Status   Specimen Description BLOOD RIGHT HAND  Final   Special Requests   Final    BOTTLES DRAWN AEROBIC AND ANAEROBIC BLUE 10CC RED 5CC   Culture  Setup Time   Final    06/03/2014 00:55 Performed at [facility]    Culture   Final           BLOOD CULTURE RECEIVED NO GROWTH TO DATE CULTURE WILL BE HELD FOR 5 DAYS BEFORE ISSUING A FINAL NEGATIVE REPORT Performed at [facility]    Report Status PENDING  Incomplete  Culture, blood (routine x 2)     Status: None (Preliminary result)   Collection Time: 06/02/14  8:03 PM  Result Value Ref Range Status   Specimen Description BLOOD LEFT ARM  Final   Special Requests BOTTLES DRAWN AEROBIC AND ANAEROBIC 10CC  Final   Culture  Setup Time   Final    06/03/2014 00:55 Performed at [facility]    Culture   Final           BLOOD CULTURE RECEIVED NO GROWTH TO DATE CULTURE WILL BE  HELD FOR 5 DAYS BEFORE ISSUING A FINAL NEGATIVE REPORT Performed at [facility]    Report Status PENDING  Incomplete  MRSA PCR Screening     Status: None   Collection Time: 06/04/14  1:13 PM  Result Value Ref Range Status   MRSA by PCR NEGATIVE NEGATIVE Final    Comment:        The GeneXpert MRSA Assay (FDA approved for NASAL specimens only), is one component of a comprehensive MRSA colonization surveillance program. It is not intended to diagnose MRSA infection nor to guide or monitor treatment for MRSA infections.      Studies: Dg Abd 1 View  06/04/2014   CLINICAL DATA:  Followup ileus.  EXAM: ABDOMEN - 1 VIEW  COMPARISON:  Two-view abdomen x-ray 06/02/2014. CTA aortobifemoral 09/18/2005.  FINDINGS: Interval improvement in the mildly distended small bowel loops in the mid abdomen since yesterday, though 102 mildly distended loops persist. Interval improvement in the mildly dilated sigmoid colon. Moderate to large stool burden throughout the colon. No suggestion of free intraperitoneal air on the supine image. Calcification in the left mid abdomen corresponds to a cortical calcification in the left kidney identified on prior CT.  IMPRESSION: Improving ileus, though there are persistent mildly distended loops of small bowel in the mid abdomen.   Electronically Signed   By: Evangeline Dakin M.D.   On: 06/04/2014 08:50    Scheduled Meds: . aspirin EC  81 mg Oral Daily  . cefTRIAXone (ROCEPHIN)  IV  2 g Intravenous Q24H  . donepezil  10 mg Oral QHS  . ezetimibe-simvastatin  1 tablet Oral QHS  . furosemide  40 mg Oral BID  . insulin aspart  0-15 Units Subcutaneous TID WC  . insulin aspart  4 Units Subcutaneous TID WC  . insulin glargine  25 Units Subcutaneous QHS  . polyethylene glycol  17 g Oral BID  . sodium chloride  3 mL Intravenous Q12H  . spironolactone  25 mg Oral Daily  . vancomycin  1,000 mg Intravenous Q12H  . Warfarin - Pharmacist Dosing Inpatient   Does not  apply q1800   Continuous Infusions:   Principal Problem:   Cellulitis Active Problems:   DM (diabetes mellitus) type II uncontrolled, periph vascular disorder   OBESITY, MORBID   Essential hypertension   Aortic valve stenosis, severe prosthetic   Chronic diastolic heart failure   Abscess of abdominal wall   Abdominal wall abscess   Hyponatremia    Time spent: 35 minutes.     Trinity Hyland A  [facility] Pager [phone]. If 7PM-7AM, please contact night-coverage at [email or web address], password [facility] 06/05/2014, 2:04 PM  LOS: 3 days

## 2014-06-05 NOTE — Telephone Encounter (Addendum)
Pt has an appointment today and wanted to advise you pt has been admitted to [facility].

## 2014-06-05 NOTE — Progress Notes (Signed)
Occupational Therapy Evaluation Patient Details Name: [name]. MRN: [id number] DOB: 1935-06-12 Today's Date: 06/05/2014    History of Present Illness Abdominal wall cellulitis, Abscess   Clinical Impression   Pt with history of dementia and was mod I with ADL and mobility PTA. Per nursing, Wife feels her husband is at his baseline cogntive status. Pt requires overall set up/S for ADL at this time. Recommend D/C home when medically stable with 24/7 S. No equipment needs. Pt has tub seat and RW at home. OT signing off.     Follow Up Recommendations  No OT follow up;Supervision/Assistance - 24 hour    Equipment Recommendations  None recommended by OT    Recommendations for Other Services       Precautions / Restrictions Precautions Precautions: Fall      Mobility Bed Mobility Overal bed mobility: Modified Independent             General bed mobility comments: Got up with little effort  Transfers Overall transfer level: Needs assistance Equipment used: None Transfers: Sit to/from Stand Sit to Stand: Supervision         General transfer comment: safe transfer technique    Balance Overall balance assessment: Needs assistance   Sitting balance-Leahy Scale: Good       Standing balance-Leahy Scale: Fair                        ADL Overall ADL's : At baseline                                       General ADL Comments: set up overall/ recommend S     Vision                     Perception     Praxis      Pertinent Vitals/Pain Pain Assessment: No/denies pain     Hand Dominance     Extremity/Trunk Assessment Upper Extremity Assessment Upper Extremity Assessment: Overall WFL for tasks assessed   Lower Extremity Assessment Lower Extremity Assessment: Overall WFL for tasks assessed   Cervical / Trunk Assessment Cervical / Trunk Assessment: Normal   Communication Communication Communication: No  difficulties   Cognition Arousal/Alertness: Awake/alert Behavior During Therapy: WFL for tasks assessed/performed Overall Cognitive Status: History of cognitive impairments - at baseline       Memory: Decreased short-term memory             General Comments       Exercises Exercises: General Lower Extremity     Shoulder Instructions      Home Living Family/patient expects to be discharged to:: Private residence Living Arrangements: Spouse/significant other (and grandson) Available Help at Discharge: Family Type of Home: House Home Access: Level entry     Home Layout: One level     Bathroom Shower/Tub: Tub/shower unit Shower/tub characteristics: Architectural technologist: Standard Bathroom Accessibility: No   Home Equipment: Other (comment);Walker - 2 wheels;Shower seat (Tub seat)          Prior Functioning/Environment Level of Independence: Independent        Comments: states he still drives    OT Diagnosis: Generalized weakness   OT Problem List:     OT Treatment/Interventions:      OT Goals(Current goals can be found in the care plan section) Acute Rehab OT Goals Patient  Stated Goal: to go home OT Goal Formulation:  (eval only)  OT Frequency:     Barriers to D/C:            Co-evaluation              End of Session Nurse Communication: Mobility status;Other (comment) (cognitive status)  Activity Tolerance: Patient tolerated treatment well Patient left: in bed;with call bell/phone within reach;with bed alarm set   Time: 0156-1537 OT Time Calculation (min): 15 min Charges:  OT General Charges $OT Visit: 1 Procedure OT Evaluation $Initial OT Evaluation Tier I: 1 Procedure OT Treatments $Self Care/Home Management : 8-22 mins G-Codes:    Francee Setzer,HILLARY Jun 27, 2014, 12:51 PM  [facility], OTR/L  [phone] June 27, 2014

## 2014-06-05 NOTE — Telephone Encounter (Signed)
thx

## 2014-06-05 NOTE — Telephone Encounter (Signed)
FYI

## 2014-06-05 NOTE — Progress Notes (Signed)
Physical Therapy Treatment Patient Details Name: [name]. MRN: [id number] DOB: 1934-07-07 Today's Date: 06/05/2014    History of Present Illness [name]. is a 79 y.o. male past medical history of aortic stenosis status post aortic valve replacement, chronic diastolic heart failure, chronic A. fib, status post pacemaker due to sick sinus syndrome on Coumadin, coronary artery sees status post CABG, diabetes mellitus with an unknown hemoglobin A1c, hyperlipidemia, essential hypertension and morbid obese that comes in for abdominal pain redness and tenderness that started one week prior to admission.  He went and saw his primary care doctor 5 days prior to admission who started him on Doxy. But he progressively got worse. He relates he had some fevers so he came here to the ED. Relates no nausea vomiting.  Treatment with Vanc progressing.    PT Comments    Progressing well.  Not as confused today, but still with some memory challenges.  Follow Up Recommendations  No PT follow up     Equipment Recommendations  None recommended by PT    Recommendations for Other Services       Precautions / Restrictions Precautions Precautions: Fall    Mobility  Bed Mobility Overal bed mobility: Modified Independent (light use of rail)             General bed mobility comments: Got up with little effort  Transfers Overall transfer level: Needs assistance Equipment used: None Transfers: Sit to/from Stand Sit to Stand: Supervision         General transfer comment: safe transfer technique  Ambulation/Gait Ambulation/Gait assistance: Supervision Ambulation Distance (Feet): 400 Feet Assistive device: None Gait Pattern/deviations: Step-through pattern Gait velocity: not able to vary speed greatly, but noticeable Gait velocity interpretation: Below normal speed for age/gender General Gait Details: Still mildly unsteady at times, but able to stay in  control   Stairs Stairs: Yes Stairs assistance: Supervision Stair Management: One rail Right;Step to pattern;Forwards Number of Stairs: 3 General stair comments: Heavy use of rail, otherwise safe.  Wheelchair Mobility    Modified Rankin (Stroke Patients Only)       Balance Overall balance assessment: Needs assistance   Sitting balance-Leahy Scale: Good       Standing balance-Leahy Scale: Fair                      Cognition Arousal/Alertness: Awake/alert Behavior During Therapy: WFL for tasks assessed/performed Overall Cognitive Status: No family/caregiver present to determine baseline cognitive functioning       Memory: Decreased short-term memory              Exercises General Exercises - Lower Extremity Ankle Circles/Pumps: AROM;20 reps;Supine Quad Sets: AROM;Both;10 reps;Supine Gluteal Sets: AROM;Both;10 reps;Supine Heel Slides: AROM;Strengthening;Both;10 reps;Supine (resisted) Straight Leg Raises: AROM;Both;10 reps;Supine    General Comments        Pertinent Vitals/Pain Pain Assessment: No/denies pain    Home Living                      Prior Function            PT Goals (current goals can now be found in the care plan section) Acute Rehab PT Goals Patient Stated Goal: none stated.Marland KitchenMarland KitchenI don't even know why I'm here. PT Goal Formulation: With patient Time For Goal Achievement: 06/10/14 Potential to Achieve Goals: Good Progress towards PT goals: Progressing toward goals    Frequency       PT Plan  Current plan remains appropriate    Co-evaluation             End of Session   Activity Tolerance: Patient tolerated treatment well Patient left: in bed;with call bell/phone within reach     Time: 0925-0945 PT Time Calculation (min) (ACUTE ONLY): 20 min  Charges:  $Gait Training: 8-22 mins                    G Codes:      Pluma Diniz, Tessie Fass 06/05/2014, 9:58 AM

## 2014-06-06 LAB — BASIC METABOLIC PANEL
Anion gap: 13 (ref 5–15)
BUN: 16 mg/dL (ref 6–23)
CHLORIDE: 97 meq/L (ref 96–112)
CO2: 27 meq/L (ref 19–32)
CREATININE: 0.93 mg/dL (ref 0.50–1.35)
Calcium: 9.8 mg/dL (ref 8.4–10.5)
GFR calc non Af Amer: 79 mL/min — ABNORMAL LOW (ref >90)
Glucose, Bld: 42 mg/dL — CL (ref 70–99)
POTASSIUM: 3.8 meq/L (ref 3.7–5.3)
Sodium: 137 mEq/L (ref 137–147)

## 2014-06-06 LAB — CBC
HEMATOCRIT: 45 % (ref 39.0–52.0)
HEMOGLOBIN: 14.8 g/dL (ref 13.0–17.0)
MCH: 29.2 pg (ref 26.0–34.0)
MCHC: 32.9 g/dL (ref 30.0–36.0)
MCV: 88.8 fL (ref 78.0–100.0)
Platelets: 218 10*3/uL (ref 150–400)
RBC: 5.07 MIL/uL (ref 4.22–5.81)
RDW: 13.4 % (ref 11.5–15.5)
WBC: 10.5 10*3/uL (ref 4.0–10.5)

## 2014-06-06 LAB — TROPONIN I: Troponin I: <0.3 ng/mL (ref <0.30)

## 2014-06-06 LAB — GLUCOSE, CAPILLARY
GLUCOSE-CAPILLARY: 87 mg/dL (ref 70–99)
Glucose-Capillary: 47 mg/dL — ABNORMAL LOW (ref 70–99)
Glucose-Capillary: 189 mg/dL — ABNORMAL HIGH (ref 70–99)
Glucose-Capillary: 175 mg/dL — ABNORMAL HIGH (ref 70–99)
Glucose-Capillary: 135 mg/dL — ABNORMAL HIGH (ref 70–99)

## 2014-06-06 LAB — PROTIME-INR
INR: 2.06 — AB (ref 0.00–1.49)
Prothrombin Time: 23.4 seconds — ABNORMAL HIGH (ref 11.6–15.2)

## 2014-06-06 LAB — POTASSIUM: POTASSIUM: 4.3 meq/L (ref 3.7–5.3)

## 2014-06-06 LAB — MAGNESIUM: MAGNESIUM: 1.9 mg/dL (ref 1.5–2.5)

## 2014-06-06 MED ORDER — WARFARIN SODIUM 10 MG PO TABS
10.0000 mg | ORAL_TABLET | Freq: Once | ORAL | Status: AC
  Administered 2014-06-06: 10 mg via ORAL
  Filled 2014-06-06: qty 1

## 2014-06-06 MED ORDER — INSULIN ASPART 100 UNIT/ML ~~LOC~~ SOLN
3.0000 [IU] | Freq: Three times a day (TID) | SUBCUTANEOUS | Status: DC
  Administered 2014-06-06 – 2014-06-07 (×4): 3 [IU] via SUBCUTANEOUS

## 2014-06-06 MED ORDER — INSULIN GLARGINE 100 UNIT/ML ~~LOC~~ SOLN
15.0000 [IU] | Freq: Every day | SUBCUTANEOUS | Status: DC
  Administered 2014-06-06: 15 [IU] via SUBCUTANEOUS
  Filled 2014-06-06 (×2): qty 0.15

## 2014-06-06 MED ORDER — GLUCERNA SHAKE PO LIQD
237.0000 mL | Freq: Two times a day (BID) | ORAL | Status: DC
  Administered 2014-06-06 – 2014-06-07 (×2): 237 mL via ORAL

## 2014-06-06 MED ORDER — MAGNESIUM OXIDE 400 (241.3 MG) MG PO TABS: 200.0000 mg | ORAL_TABLET | Freq: Two times a day (BID) | ORAL | Status: DC

## 2014-06-06 MED ORDER — MAGNESIUM OXIDE 400 (241.3 MG) MG PO TABS
200.0000 mg | ORAL_TABLET | Freq: Two times a day (BID) | ORAL | Status: AC
  Administered 2014-06-06 – 2014-06-07 (×2): 200 mg via ORAL
  Filled 2014-06-06 (×2): qty 0.5

## 2014-06-06 NOTE — Progress Notes (Signed)
INITIAL NUTRITION ASSESSMENT  DOCUMENTATION CODES Per approved criteria  -Not Applicable   INTERVENTION: - Pt educated on a carbohydrate modified diet. See education note. - Glucerna Shake po BID, each supplement provides 220 kcal and 10 grams of protein  NUTRITION DIAGNOSIS: Increased nutrient needs related to wound healing as evidenced by abdominal wall abcess.   Goal: Pt to meet >/= 90% of their estimated nutrition needs   Monitor:  Level of knowledge, PO intake, acceptance of supplements, labs  Reason for Assessment: Consult for Nutrition Education  79 y.o. male  Admitting Dx: Cellulitis  ASSESSMENT: 79 y.o. male past medical history of aortic stenosis status post aortic valve replacement, chronic diastolic heart failure, chronic A. fib, status post pacemaker due to sick sinus syndrome on Coumadin, coronary artery sees status post CABG, diabetes mellitus with an unknown hemoglobin A1c, hyperlipidemia, essential hypertension and morbid obese that comes in for abdominal pain redness and tenderness to start it one week prior to admission he went and saw his primary care doctor 5 days prior to admission who started him on Doxy. But she progressively got worse. He relates he had some fevers so he came here to the ED.  - Pt with abdominal wall cellulitis with abscess. Followed by wound care nurse. - Pt educated on a diabetic diet. Pt reports a good appetite with no recent weight loss.   Labs: CBG's 47-250 Na and K WNL  Height: Ht Readings from Last 1 Encounters:  06/02/14 5\' 10"  (1.778 m)    Weight: Wt Readings from Last 1 Encounters:  06/02/14 221 lb 9.6 oz (100.517 kg)    Ideal Body Weight: 73 kg  % Ideal Body Weight: 137%  Wt Readings from Last 10 Encounters:  06/02/14 221 lb 9.6 oz (100.517 kg)  05/31/14 224 lb 6 oz (101.776 kg)  05/15/14 232 lb (105.235 kg)  02/22/14 233 lb (105.688 kg)  01/05/14 234 lb (106.142 kg)  12/22/13 229 lb 8 oz (104.101 kg)   10/05/13 231 lb (104.781 kg)  07/18/13 229 lb (103.874 kg)  05/12/13 229 lb (103.874 kg)  04/04/13 224 lb (101.606 kg)    BMI:  Body mass index is 31.8 kg/(m^2).  Estimated Nutritional Needs: Kcal: 2200-2400 Protein: 140-150 g Fluid: 2.2-2.4 L/day  Skin: abdominal wall abcess  Diet Order: Diet Carb Modified  EDUCATION NEEDS: -Education needs addressed   Intake/Output Summary (Last 24 hours) at 06/06/14 1357 Last data filed at 06/06/14 0117  Gross per 24 hour  Intake   1060 ml  Output    500 ml  Net    560 ml    Last BM: 12/14   Labs:   Recent Labs Lab 06/04/14 0830 06/05/14 0645 06/06/14 0606  NA 136* 139 137  K 3.6* 3.7 3.8  CL 97 99 97  CO2 25 29 27   BUN 18 17 16   CREATININE 0.97 0.95 0.93  CALCIUM 9.4 9.7 9.8  GLUCOSE 51* 52* 42*    CBG (last 3)   Recent Labs  06/06/14 0751 06/06/14 0811 06/06/14 1200  GLUCAP 47* 87 189*    Scheduled Meds: . aspirin EC  81 mg Oral Daily  . cefTRIAXone (ROCEPHIN)  IV  2 g Intravenous Q24H  . donepezil  10 mg Oral QHS  . ezetimibe-simvastatin  1 tablet Oral QHS  . furosemide  40 mg Oral BID  . insulin aspart  0-15 Units Subcutaneous TID WC  . insulin aspart  3 Units Subcutaneous TID WC  . insulin glargine  15 Units Subcutaneous QHS  . polyethylene glycol  17 g Oral BID  . sodium chloride  3 mL Intravenous Q12H  . spironolactone  25 mg Oral Daily  . vancomycin  1,000 mg Intravenous Q12H  . warfarin  10 mg Oral ONCE-1800  . Warfarin - Pharmacist Dosing Inpatient   Does not apply q1800    Continuous Infusions:   Past Medical History  Diagnosis Date  . Erectile dysfunction   . Obesity   . OSA (obstructive sleep apnea)     not using CPAP  . History of nephrolithiasis   . HTN (hypertension)   . Aortal stenosis 2006    biophrostetic  . Atrial fibrillation     onset after CABG--- [facility]madin management [facility]  . S/P aortic valve replacement   . CHF (congestive heart failure)     abter  CABG  . Pacemaker 2/07    VVI  . PVD (peripheral vascular disease)     CT angio showed- vascular insuff, intestine and RAS bilaterally, andgiogram 02/2009 severe PVD medical management  . Skin cancer     surgery (nose) 09-2010  . Sick sinus syndrome     [name]  . Hyperlipidemia   . Arthritis   . Gangrene   . Coronary artery disease   . DM type 2 (diabetes mellitus, type 2)     used to see Dr Meredith Pel, now f/u by Dr Larose Kells  . Memory loss     Past Surgical History  Procedure Laterality Date  . Aortic valve replacement      biophrostetic for Ao Stenosis 2006  . Coronary artery bypass graft  2006  . Tonsillectomy    . Cataract extraction, bilateral    . Amputation  01/2010    great toe  . Pacemaker placement  07/2005    Medtronic Sigma SR implanted by Dr Olevia Perches  . Aortic valve replacement  01/17/2011    S/P redo median sternotomy, extracorporeal circulation, redo Aortic Valve Replacement using a 23-mm Edwards pericardial Magna-Ease valve, Dr Ulla Gallo  . Angioplasty / stenting femoral  02/10/10    Left femoral  . Incise and drain abcess Right May 2014    right heel   . Eye surgery    . Abdominal aortagram N/A 11/10/2012    Procedure: ABDOMINAL Maxcine Ham;  Surgeon: Serafina Mitchell, MD;  Location: [facility] CATH LAB;  Service: Cardiovascular;  Laterality: N/A;  . Lower extremity angiogram Left 11/23/2012    Procedure: LOWER EXTREMITY ANGIOGRAM;  Surgeon: Serafina Mitchell, MD;  Location: [facility] CATH LAB;  Service: Cardiovascular;  Laterality: Left;    Laurette Schimke MS, RD, LDN

## 2014-06-06 NOTE — Progress Notes (Signed)
Inpatient Diabetes Program Recommendations  AACE/ADA: New Consensus Statement on Inpatient Glycemic Control (2013)  Target Ranges:  Prepandial:   less than 140 mg/dL      Peak postprandial:   less than 180 mg/dL (1-2 hours)      Critically ill patients:  140 - 180 mg/dL   I am reviewing the glucose patterns to assess why the fasting continues to be low despite less than half home lantus dose is ordered. Appears that lantus dose is high, but at 25 units (home dose 60 units), pt still having low fastings. Possibly pt is eating a bit more at home causing the HS cbg to be high, thus  Higher doses lantus is lowering the fasting to normal ---AT HOME.  Agree with lowering the lantus again to 15 units. If pt can eat properly and maintain normal glucose levels throughout the day, pt may not bee needed in such high doses as at home. Will talk with patient today and assess situation.  Will order a dietician consult as well. HgbA1C is high at 10.7%.  Appears pt runs high throughout the day due to diet at home(?)  Thank you, Rosita Kea, RN, CNS, Diabetes Coordinator [phone])

## 2014-06-06 NOTE — Plan of Care (Signed)
Problem: Food- and Nutrition-Related Knowledge Deficit (NB-1.1) Goal: Nutrition education Formal process to instruct or train a patient/client in a skill or to impart knowledge to help patients/clients voluntarily manage or modify food choices and eating behavior to maintain or improve health. Outcome: Completed/Met Date Met:  06/06/14  RD consulted for nutrition education regarding diabetes.     Lab Results  Component Value Date    HGBA1C 10.7* 06/02/2014    RD provided "Carbohydrate Counting for People with Diabetes" handout from the Academy of Nutrition and Dietetics. Discussed different food groups and their effects on blood sugar, emphasizing carbohydrate-containing foods. Provided list of carbohydrates and recommended serving sizes of common foods.  Discussed importance of controlled and consistent carbohydrate intake throughout the day. Provided examples of ways to balance meals/snacks and encouraged intake of high-fiber, whole grain complex carbohydrates. Teach back method used.  Expect good compliance.  Body mass index is 31.8 kg/(m^2). Pt meets criteria for obesity based on current BMI.  Current diet order is carb modified, patient is consuming approximately 100% of meals at this time. Labs and medications reviewed. No further nutrition interventions warranted at this time. RD contact information provided. If additional nutrition issues arise, please re-consult RD.  Laurette Schimke MS, RD, LDN

## 2014-06-06 NOTE — Progress Notes (Signed)
ANTICOAGULATION CONSULT NOTE - Follow Up Consult  Pharmacy Consult for Coumadin, Vanco, and Rocephin Indication: Bioprosthetic AVR/afib  Cellulitis of abdominal wall/abscess  Allergies  Allergen Reactions  . Potassium-Containing Compounds Anaphylaxis    IV--loss of conciousness  . Metoprolol Succinate Other (See Comments)    Extremely tired    Patient Measurements: Height: 5' 10"  (177.8 cm) Weight: 221 lb 9.6 oz (100.517 kg) IBW/kg (Calculated) : 73 Heparin Dosing Weight:   Vital Signs: Temp: 97.7 F (36.5 C) (12/15 0507) Temp Source: Oral (12/15 0507) BP: 120/56 mmHg (12/15 0507) Pulse Rate: 64 (12/15 0507)  Labs:  Recent Labs  06/04/14 0534  06/04/14 0830 06/05/14 0645 06/06/14 0606  HGB  --   < > 14.5 14.8 14.8  HCT  --   --  43.8 44.1 45.0  PLT  --   --  204 229 218  LABPROT 26.7*  --   --  21.4* 23.4*  INR 2.44*  --   --  1.83* 2.06*  CREATININE  --   --  0.97 0.95 0.93  < > = values in this interval not displayed.  Estimated Creatinine Clearance: 79 mL/min (by C-G formula based on Cr of 0.93).   Assessment: 7 yoM presents with abdominal pain, redness, and tenderness for 1 week. He was recently started on Doxycycline as outpatient. He reports some fevers but no N/V.  PMH: aortic stenosis S/P aortic valve replacement, chronic diastolic HF, Afib, DM, HLD, HTN, obesity  Anticoagulation: On Coumadin PTA for bioprosthetic aortic valve/Afib. INR goal 2-3 (per coumadin clinic notes). INR on admit was 3.60. INR now 2.06. * PTA dose: 7.5 mg MWF, 58m TTSS  Infectious Disease: Abscess of abdominal wall with cellulitis - failed outpatient treatment with doxycycline. Afebrile, WBC 10.5, SCr 0.93, CrCl 79  Vancomycin 12/11>> * 12/13 VT 13.8 >> cont 1g/12h Ceftriaxone 12/11>>  12/11 Bld Cx2>> ngtd  Cardiovascular: HTN, HF, afib, AVR, BP 120/56, HR 64. On ASA81, Vytorin, lasix, spironolactone  Endocrinology: Hgb A1c 10.7; CBG 108-250 yesterday, but today has been  47, 87. SSI, Lantus decreased (not compliant with insulin on med rec note)  Gastrointestinal / Nutrition: LFTs WNL, Alk phos elevated. Ileus vs SBO. Miralax BID  Neurology: Aricept  Nephrology: SCr stable 0.93.  Pulmonary: RA  Hematology / Oncology: CBC stable  PTA Medication Issues: Lisinopril  Best Practices: Warfarin  Goal of Therapy:  INR 2-3  Vanco trough 10-15 Monitor platelets by anticoagulation protocol: Yes   Plan:  - Cont Vanc 1g/12h - Cont CTX 2g/24h - Warf 10 mg x 1 dose today  Raynee Mccasland S. RAlford Highland PharmD, BCPS Clinical Staff Pharmacist Pager [phone] REilene GhaziStillinger 06/06/2014,1:47 PM

## 2014-06-06 NOTE — Progress Notes (Signed)
Hypoglycemic Event  CBG: 47  Treatment: 15 GM carbohydrate snack  Symptoms: None  Follow-up CBG: Time:0811 CBG Result:87  Possible Reasons for Event: Unknown  Comments/MD notified: Dr. Tyrell Antonio notified.    [name]  Remember to initiate Hypoglycemia Order Set & complete

## 2014-06-06 NOTE — Progress Notes (Signed)
NP [name] notify concerning patient's morning b/p.

## 2014-06-06 NOTE — Clinical Social Work Placement (Signed)
Clinical Social Work Department CLINICAL SOCIAL WORK PLACEMENT NOTE 06/06/2014  Patient:  GARRET, TEALE  Account Number:  [id number] Admit date:  06/02/2014  Clinical Social Worker:  Kemper Durie, [place]  Date/time:  06/06/2014 04:25 PM  Clinical Social Work is seeking post-discharge placement for this patient at the following level of care:   [place]   (*CSW will update this form in Epic as items are completed)   06/05/2014  Patient/family provided with [place] Department of Clinical Social Work's list of facilities offering this level of care within the geographic area requested by the patient (or if unable, by the patient's family).  06/05/2014  Patient/family informed of their freedom to choose among providers that offer the needed level of care, that participate in Medicare, Medicaid or managed care program needed by the patient, have an available bed and are willing to accept the patient.  06/05/2014  Patient/family informed of [facility]' ownership interest in [facility], as well as of the fact that they are under no obligation to receive care at this facility.  PASARR submitted to EDS on 06/05/2014 PASARR number received on 06/05/2014  FL2 transmitted to all facilities in geographic area requested by pt/family on  06/05/2014 FL2 transmitted to all facilities within larger geographic area on   Patient informed that his/her managed care company has contracts with or will negotiate with  certain facilities, including the following:     Patient/family informed of bed offers received:  06/06/2014 Patient chooses bed at [facility] Physician recommends and patient chooses bed at    Patient to be transferred to [place] on   Patient to be transferred to facility by  Patient and family notified of transfer on  Name of family member notified:    The following physician request were entered in Epic:   Additional Comments:    Liz Beach MSW, [place], [place], [phone]

## 2014-06-06 NOTE — Progress Notes (Signed)
[facility] PROGRESS NOTE  [name]. RAQ:[id number] DOB: 02/09/1935 DOA: 06/02/2014 PCP: [name], MD  Assessment/Plan: 1-Abdominal wall cellulitis, Abscess;  Continue with IV vancomycin and ceftriaxone day 4.  Wound care consulted. Continue with dressing changes BID, see wound care note.  Redness, edema improving. WBC normalized.   2-Diabetes: SSI. HBA1 at 10, poor controlled. Will decrease lantus further to 15 units , due to hypoglycemia.   3-Obesity;counseling provided.   Ileus vs SBO on x ray; Denies abdominal pain. X ray improved dilation. Continue with miralax.   HTN;on lasix. Spironolactone. Hold lisinopril.   Chronic diastolic HF; continue with lasix.   History of A fib, SP pericardial AVR replacement.  On coumadin.   Code Status: Full Code Family Communication: care discussed with wife 12-13. Disposition Plan: Remain inpatient.    Consultants:  Wound care consult.   Procedures:  none  Antibiotics:  Vancomycin 12-11  Ceftriaxone 12-11  HPI/Subjective: No complaints. He agree with going to SNF.   Objective: Filed Vitals:   06/06/14 0507  BP: 120/56  Pulse: 64  Temp: 97.7 F (36.5 C)  Resp: 18    Intake/Output Summary (Last 24 hours) at 06/06/14 1337 Last data filed at 06/06/14 0117  Gross per 24 hour  Intake   1060 ml  Output    500 ml  Net    560 ml   Filed Weights   06/02/14 1836  Weight: 100.517 kg (221 lb 9.6 oz)    Exam:   General:  Alert in no distress.   Cardiovascular: S 1, S 2 RRR  Respiratory: CTA  Abdomen: BS present, soft, obese, redness has decreases, packing in place.   Musculoskeletal: trace edema.   Data Reviewed: Basic Metabolic Panel:  Recent Labs Lab 06/02/14 1341 06/03/14 0448 06/04/14 0830 06/05/14 0645 06/06/14 0606  NA 134* 135* 136* 139 137  K 4.3 3.9 3.6* 3.7 3.8  CL 95* 97 97 99 97  CO2 24 25 25 29 27   GLUCOSE 173* 98 51* 52* 42*  BUN 20 22 18 17 16   CREATININE 1.14 1.17  0.97 0.95 0.93  CALCIUM 10.3 9.6 9.4 9.7 9.8   Liver Function Tests:  Recent Labs Lab 06/02/14 1341 06/03/14 0448  AST 21 17  ALT 16 12  ALKPHOS 142* 120*  BILITOT 1.0 0.5  PROT 8.3 7.0  ALBUMIN 3.0* 2.5*   No results for input(s): LIPASE, AMYLASE in the last 168 hours. No results for input(s): AMMONIA in the last 168 hours. CBC:  Recent Labs Lab 06/02/14 1341 06/03/14 0448 06/04/14 0830 06/05/14 0645 06/06/14 0606  WBC 13.2* 11.0* 9.7 10.6* 10.5  NEUTROABS 9.8*  --   --   --   --   HGB 15.8 13.6 14.5 14.8 14.8  HCT 46.9 42.0 43.8 44.1 45.0  MCV 89.2 88.8 88.5 88.2 88.8  PLT 205 191 204 229 218   Cardiac Enzymes: No results for input(s): CKTOTAL, CKMB, CKMBINDEX, TROPONINI in the last 168 hours. BNP (last 3 results) No results for input(s): PROBNP in the last 8760 hours. CBG:  Recent Labs Lab 06/05/14 1714 06/05/14 2148 06/06/14 0751 06/06/14 0811 06/06/14 1200  GLUCAP 250* 108* 47* 87 189*    Recent Results (from the past 240 hour(s))  Culture, blood (routine x 2)     Status: None (Preliminary result)   Collection Time: 06/02/14  7:50 PM  Result Value Ref Range Status   Specimen Description BLOOD RIGHT HAND  Final   Special Requests  Final    BOTTLES DRAWN AEROBIC AND ANAEROBIC BLUE 10CC RED 5CC   Culture  Setup Time   Final    06/03/2014 00:55 Performed at [facility]    Culture   Final           BLOOD CULTURE RECEIVED NO GROWTH TO DATE CULTURE WILL BE HELD FOR 5 DAYS BEFORE ISSUING A FINAL NEGATIVE REPORT Performed at [facility]    Report Status PENDING  Incomplete  Culture, blood (routine x 2)     Status: None (Preliminary result)   Collection Time: 06/02/14  8:03 PM  Result Value Ref Range Status   Specimen Description BLOOD LEFT ARM  Final   Special Requests BOTTLES DRAWN AEROBIC AND ANAEROBIC 10CC  Final   Culture  Setup Time   Final    06/03/2014 00:55 Performed at [facility]    Culture   Final            BLOOD CULTURE RECEIVED NO GROWTH TO DATE CULTURE WILL BE HELD FOR 5 DAYS BEFORE ISSUING A FINAL NEGATIVE REPORT Performed at [facility]    Report Status PENDING  Incomplete  MRSA PCR Screening     Status: None   Collection Time: 06/04/14  1:13 PM  Result Value Ref Range Status   MRSA by PCR NEGATIVE NEGATIVE Final    Comment:        The GeneXpert MRSA Assay (FDA approved for NASAL specimens only), is one component of a comprehensive MRSA colonization surveillance program. It is not intended to diagnose MRSA infection nor to guide or monitor treatment for MRSA infections.      Studies: No results found.  Scheduled Meds: . aspirin EC  81 mg Oral Daily  . cefTRIAXone (ROCEPHIN)  IV  2 g Intravenous Q24H  . donepezil  10 mg Oral QHS  . ezetimibe-simvastatin  1 tablet Oral QHS  . furosemide  40 mg Oral BID  . insulin aspart  0-15 Units Subcutaneous TID WC  . insulin aspart  3 Units Subcutaneous TID WC  . insulin glargine  15 Units Subcutaneous QHS  . polyethylene glycol  17 g Oral BID  . sodium chloride  3 mL Intravenous Q12H  . spironolactone  25 mg Oral Daily  . vancomycin  1,000 mg Intravenous Q12H  . Warfarin - Pharmacist Dosing Inpatient   Does not apply q1800   Continuous Infusions:   Principal Problem:   Cellulitis Active Problems:   DM (diabetes mellitus) type II uncontrolled, periph vascular disorder   OBESITY, MORBID   Essential hypertension   Aortic valve stenosis, severe prosthetic   Chronic diastolic heart failure   Abscess of abdominal wall   Abdominal wall abscess   Hyponatremia    Time spent: 35 minutes.     Niel Hummer A  [facility] Pager [phone]. If 7PM-7AM, please contact night-coverage at [email or web address], password [facility] 06/06/2014, 1:37 PM  LOS: 4 days

## 2014-06-06 NOTE — Progress Notes (Addendum)
CRITICAL VALUE ALERT  Critical value received:  Glucose 42  Date of notification:  06/06/14  Time of notification:  8768  Critical value read back:Yes.    Nurse who received alert:  Rexford Maus  MD notified (1st page):  Dr. Tyrell Antonio  Time of first page:  [phone]  MD notified (2nd page):  Time of second page:  Responding MD:  Dr. Tyrell Antonio  Time MD responded:  [phone]

## 2014-06-06 NOTE — Clinical Social Work Psychosocial (Signed)
Clinical Social Work Department BRIEF PSYCHOSOCIAL ASSESSMENT 06/06/2014  Patient:  [name], [name]     Account Number:  [id number]     Admit date:  06/02/2014  Clinical Social Worker:  Lovey Newcomer  Date/Time:  06/06/2014 04:16 PM  Referred by:  Physician  Date Referred:  06/06/2014 Referred for  SNF Placement   Other Referral:   NA   Interview type:  Patient Other interview type:   Patient and family interviewed at bedside.    PSYCHOSOCIAL DATA Living Status:  WIFE Admitted from facility:   Level of care:   Primary support name:  Vaughan Basta and Malachy Mood Primary support relationship to patient:  SPOUSE Degree of support available:   Support is strong.    CURRENT CONCERNS Current Concerns  Post-Acute Placement   Other Concerns:   NA    SOCIAL WORK ASSESSMENT / PLAN CSW met with patient, patient's daughter, and patient's wife to complete assessment. Patient states that he is agreeable to go to SNF for wound care as his family does not feel they can manage the wound care at home. CSW explained SNF search/placement process and answered family's questions. Family has a preference for [facility]. Patient and family appeared calm and engaged in assessment. CSW will assist as appropriate.   Assessment/plan status:  Psychosocial Support/Ongoing Assessment of Needs Other assessment/ plan:   Complete Fl2, Fax, PASRR   Information/referral to community resources:   CSW contact information and SNF list given.    PATIENT'S/FAMILY'S RESPONSE TO PLAN OF CARE: Patient and family would like to patient to DC to SNF when ready. CSW will assist.       Liz Beach MSW, [place], [place], [phone]

## 2014-06-07 DIAGNOSIS — R278 Other lack of coordination: Secondary | ICD-10-CM | POA: Diagnosis not present

## 2014-06-07 DIAGNOSIS — L03311 Cellulitis of abdominal wall: Secondary | ICD-10-CM | POA: Diagnosis not present

## 2014-06-07 DIAGNOSIS — I350 Nonrheumatic aortic (valve) stenosis: Secondary | ICD-10-CM | POA: Diagnosis not present

## 2014-06-07 DIAGNOSIS — Z950 Presence of cardiac pacemaker: Secondary | ICD-10-CM | POA: Diagnosis not present

## 2014-06-07 DIAGNOSIS — I4891 Unspecified atrial fibrillation: Secondary | ICD-10-CM | POA: Diagnosis not present

## 2014-06-07 DIAGNOSIS — I10 Essential (primary) hypertension: Secondary | ICD-10-CM | POA: Diagnosis not present

## 2014-06-07 DIAGNOSIS — E785 Hyperlipidemia, unspecified: Secondary | ICD-10-CM | POA: Diagnosis not present

## 2014-06-07 DIAGNOSIS — E1159 Type 2 diabetes mellitus with other circulatory complications: Secondary | ICD-10-CM | POA: Diagnosis not present

## 2014-06-07 DIAGNOSIS — R41841 Cognitive communication deficit: Secondary | ICD-10-CM | POA: Diagnosis not present

## 2014-06-07 DIAGNOSIS — I5032 Chronic diastolic (congestive) heart failure: Secondary | ICD-10-CM | POA: Diagnosis not present

## 2014-06-07 DIAGNOSIS — L02211 Cutaneous abscess of abdominal wall: Secondary | ICD-10-CM | POA: Diagnosis not present

## 2014-06-07 DIAGNOSIS — Z954 Presence of other heart-valve replacement: Secondary | ICD-10-CM | POA: Diagnosis not present

## 2014-06-07 DIAGNOSIS — R5381 Other malaise: Secondary | ICD-10-CM | POA: Diagnosis not present

## 2014-06-07 DIAGNOSIS — M6281 Muscle weakness (generalized): Secondary | ICD-10-CM | POA: Diagnosis not present

## 2014-06-07 LAB — BASIC METABOLIC PANEL
Anion gap: 9 (ref 5–15)
BUN: 17 mg/dL (ref 6–23)
CHLORIDE: 95 meq/L — AB (ref 96–112)
CO2: 32 meq/L (ref 19–32)
CREATININE: 1.01 mg/dL (ref 0.50–1.35)
Calcium: 10.3 mg/dL (ref 8.4–10.5)
GFR calc non Af Amer: 70 mL/min — ABNORMAL LOW (ref >90)
GFR, EST AFRICAN AMERICAN: 81 mL/min — AB (ref >90)
Glucose, Bld: 83 mg/dL (ref 70–99)
POTASSIUM: 4.4 meq/L (ref 3.7–5.3)
SODIUM: 136 meq/L — AB (ref 137–147)

## 2014-06-07 LAB — CBC
HEMATOCRIT: 45.7 % (ref 39.0–52.0)
HEMOGLOBIN: 14.9 g/dL (ref 13.0–17.0)
MCH: 29.3 pg (ref 26.0–34.0)
MCHC: 32.6 g/dL (ref 30.0–36.0)
MCV: 89.8 fL (ref 78.0–100.0)
Platelets: 242 10*3/uL (ref 150–400)
RBC: 5.09 MIL/uL (ref 4.22–5.81)
RDW: 13.5 % (ref 11.5–15.5)
WBC: 10 10*3/uL (ref 4.0–10.5)

## 2014-06-07 LAB — GLUCOSE, CAPILLARY
Glucose-Capillary: 108 mg/dL — ABNORMAL HIGH (ref 70–99)
Glucose-Capillary: 141 mg/dL — ABNORMAL HIGH (ref 70–99)

## 2014-06-07 LAB — PROTIME-INR
INR: 2.46 — AB (ref 0.00–1.49)
Prothrombin Time: 26.9 seconds — ABNORMAL HIGH (ref 11.6–15.2)

## 2014-06-07 LAB — TROPONIN I: Troponin I: <0.3 ng/mL (ref <0.30)

## 2014-06-07 MED ORDER — INSULIN GLARGINE 100 UNIT/ML ~~LOC~~ SOLN: 15.0000 [IU] | Freq: Every day | SUBCUTANEOUS | Status: DC

## 2014-06-07 MED ORDER — SULFAMETHOXAZOLE-TRIMETHOPRIM 800-160 MG PO TABS: 1.0000 | ORAL_TABLET | Freq: Two times a day (BID) | ORAL | Status: DC

## 2014-06-07 MED ORDER — CLINDAMYCIN HCL 300 MG PO CAPS: 600.0000 mg | ORAL_CAPSULE | Freq: Three times a day (TID) | ORAL | Status: DC

## 2014-06-07 MED ORDER — FUROSEMIDE 40 MG PO TABS: 40.0000 mg | ORAL_TABLET | Freq: Two times a day (BID) | ORAL | Status: DC

## 2014-06-07 MED ORDER — INSULIN ASPART 100 UNIT/ML ~~LOC~~ SOLN: 3.0000 [IU] | Freq: Three times a day (TID) | SUBCUTANEOUS | Status: DC

## 2014-06-07 MED ORDER — GLUCERNA SHAKE PO LIQD: 237.0000 mL | Freq: Two times a day (BID) | ORAL | Status: DC

## 2014-06-07 MED ORDER — WARFARIN SODIUM 7.5 MG PO TABS
7.5000 mg | ORAL_TABLET | Freq: Once | ORAL | Status: DC
  Filled 2014-06-07: qty 1

## 2014-06-07 MED ORDER — CEPHALEXIN 500 MG PO CAPS
500.0000 mg | ORAL_CAPSULE | Freq: Two times a day (BID) | ORAL | Status: DC
Start: 2014-06-07 — End: 2015-02-12

## 2014-06-07 NOTE — Progress Notes (Signed)
CARE MANAGEMENT NOTE 06/07/2014  Patient:  [name], [name]   Account Number:  [id number]  Date Initiated:  06/05/2014  Documentation initiated by:  Tomi Bamberger  Subjective/Objective Assessment:   dx abd wall abscess  admit- lives with spouse     Action/Plan:   Anticipated DC Date:  06/06/2014   Anticipated DC Plan:  SKILLED NURSING FACILITY  In-house referral  Clinical Social Worker      DC Planning Services  CM consult      Choice offered to / List presented to:             Status of service:  Completed, signed off Medicare Important Message given?  YES (If response is "NO", the following Medicare IM given date fields will be blank) Date Medicare IM given:  06/05/2014 Medicare IM given by:  Tomi Bamberger Date Additional Medicare IM given:   Additional Medicare IM given by:    Discharge Disposition:  [place]  Per UR Regulation:  Reviewed for med. necessity/level of care/duration of stay  If discussed at [place] of Stay Meetings, dates discussed:    Comments:  06/05/14  [place], BSN [phone] patient 's daughter stated they will not have anyone at home to help with dressing changes and spouse state she will not be able to do it, NCM explained that [facility] services can not do BID dressing changes , there will have to be somone in the home they can teach how to do the dressing. Daughter , Malachy Mood states they would like to get patient to a facility.  NCM called and left message for wife to return call.  NCM spoke with patient and he states to let the CSW know to go ahead and fax his information out to facilities. NCM informed CSW.  NCM received call from spouse, she states that she can not do dressing, she begins to feel sick, NCM explained that hh services can not come out twice a day to do dressing, and that patient states he would go to snf, wife states she would like to talk to patient tomorrow to make sure this is what he wants to do and to let  the CSW know to go ahead and fax out information.  Wife asks if they want to change their mind and go home woud this be a problem? NCM informed her that this would not be a problem we would just set up hh services.

## 2014-06-07 NOTE — Progress Notes (Signed)
Pt prepared for d/c to SNF. IV d/c'd. Skin intact except as most recently charted. Vitals are stable. Report called to receiving facility. Pt to be transported by ambulance service. 

## 2014-06-07 NOTE — Progress Notes (Signed)
ANTICOAGULATION CONSULT NOTE - Follow Up Consult  Pharmacy Consult for Coumadin Indication: AFib, Bioprosthetic AVR  Allergies  Allergen Reactions  . Potassium-Containing Compounds Anaphylaxis    IV--loss of conciousness  . Metoprolol Succinate Other (See Comments)    Extremely tired    Patient Measurements: Height: 5\' 10"  (177.8 cm) Weight: 221 lb 9.6 oz (100.517 kg) IBW/kg (Calculated) : 73 Heparin Dosing Weight:   Vital Signs: Temp: 97.6 F (36.4 C) (12/16 0605) Temp Source: Oral (12/16 0605) BP: 119/52 mmHg (12/16 0605) Pulse Rate: 61 (12/16 0605)  Labs:  Recent Labs  06/05/14 0645 06/06/14 0606 06/06/14 1515 06/06/14 2040 06/07/14 0400 06/07/14 0430 06/07/14 0906  HGB 14.8 14.8  --   --   --   --   --   HCT 44.1 45.0  --   --   --   --   --   PLT 229 218  --   --   --   --   --   LABPROT 21.4* 23.4*  --   --   --  26.9*  --   INR 1.83* 2.06*  --   --   --  2.46*  --   CREATININE 0.95 0.93  --   --   --   --  1.01  TROPONINI  --   --  <0.30 <0.30 <0.30  --   --     Estimated Creatinine Clearance: 72.8 mL/min (by C-G formula based on Cr of 1.01).   Medications:  Scheduled:  . aspirin EC  81 mg Oral Daily  . cefTRIAXone (ROCEPHIN)  IV  2 g Intravenous Q24H  . donepezil  10 mg Oral QHS  . ezetimibe-simvastatin  1 tablet Oral QHS  . feeding supplement (GLUCERNA SHAKE)  237 mL Oral BID BM  . furosemide  40 mg Oral BID  . insulin aspart  0-15 Units Subcutaneous TID WC  . insulin aspart  3 Units Subcutaneous TID WC  . insulin glargine  15 Units Subcutaneous QHS  . polyethylene glycol  17 g Oral BID  . sodium chloride  3 mL Intravenous Q12H  . spironolactone  25 mg Oral Daily  . vancomycin  1,000 mg Intravenous Q12H  . Warfarin - Pharmacist Dosing Inpatient   Does not apply q1800    Assessment: 79yo male with AFib, bioprosthetic AVR.  INR this AM 2.46.  No bleeding noted.  Pt to be discharged on Bactrim and Keflex.  Discussed Bactrim with MD and she  will change this to Clindamycin given probable increased Coumadin effect interaction with Bactrim.    Goal of Therapy:  INR 2-3 Monitor platelets by anticoagulation protocol: Yes   Plan:  Coumadin 7.5mg  today   Gracy Bruins, PharmD Clinical Pharmacist [facility]

## 2014-06-07 NOTE — Clinical Social Work Placement (Signed)
Clinical Social Work Department CLINICAL SOCIAL WORK PLACEMENT NOTE 06/07/2014  Patient:  [name], [name]  Account Number:  [id number] Admit date:  06/02/2014  Clinical Social Worker:  Kemper Durie, [place]  Date/time:  06/06/2014 04:25 PM  Clinical Social Work is seeking post-discharge placement for this patient at the following level of care:   [place]   (*CSW will update this form in Epic as items are completed)   06/05/2014  Patient/family provided with [place] Department of Clinical Social Work's list of facilities offering this level of care within the geographic area requested by the patient (or if unable, by the patient's family).  06/05/2014  Patient/family informed of their freedom to choose among providers that offer the needed level of care, that participate in Medicare, Medicaid or managed care program needed by the patient, have an available bed and are willing to accept the patient.  06/05/2014  Patient/family informed of [facility]' ownership interest in [facility], as well as of the fact that they are under no obligation to receive care at this facility.  PASARR submitted to EDS on 06/05/2014 PASARR number received on 06/05/2014  FL2 transmitted to all facilities in geographic area requested by pt/family on  06/05/2014 FL2 transmitted to all facilities within larger geographic area on   Patient informed that his/her managed care company has contracts with or will negotiate with  certain facilities, including the following:     Patient/family informed of bed offers received:  06/06/2014 Patient chooses bed at [facility] Physician recommends and patient chooses bed at    Patient to be transferred to [facility] on  06/07/2014 Patient to be transferred to facility by Wife wants to transport patient Patient and family notified of transfer on 06/07/2014 Name of family member notified:  Vaughan Basta  The following physician request were  entered in Epic:   Additional Comments:    Per MD patient ready for DC to [facility]. RN, patient, patient's family, and facility notified of DC. RN given number for report. DC packet on chart. Patient's wife wishes to transport the patient, she states she will call the unit to inform them of her arrival at front entrance. Packet on chart, RN instructed to make sure the patient has DC packet on chart, CSW signing off.    Liz Beach MSW, [place], [place], [phone]

## 2014-06-07 NOTE — Discharge Summary (Addendum)
Physician Discharge Summary  [name]. TKW:[id number] DOB: [birth year] DOA: 06/02/2014  PCP: [name], MD  Admit date: 06/02/2014 Discharge date: 06/07/2014  Time spent: 35 minutes  Recommendations for Outpatient Follow-up:  1. Needs daily wound care, twice a day dressing changes, see below recommendation.  2. Need to be follow up by wound care team, need close monitor of wound.  3. Need daily INR now that patient is on antibiotics. Adjust coumadin doses as needed.  4. Monitor for hypoglycemia, adjust insulin as needed.   Discharge Diagnoses:    Abdominal wall abscess and Cellulitis   DM (diabetes mellitus) type II uncontrolled, periph vascular disorder   OBESITY, MORBID   Essential hypertension   Aortic valve stenosis, severe prosthetic   Chronic diastolic heart failure   Hyponatremia   Discharge Condition: Stable.   Diet recommendation: Carb modified.   Filed Weights   06/02/14 1836  Weight: 100.517 kg (221 lb 9.6 oz)    History of present illness:  [name]. is a 79 y.o. male past medical history of aortic stenosis status post aortic valve replacement, chronic diastolic heart failure, chronic A. fib, status post pacemaker due to sick sinus syndrome on Coumadin, coronary artery sees status post CABG, diabetes mellitus with an unknown hemoglobin A1c, hyperlipidemia, essential hypertension and morbid obese that comes in for abdominal pain redness and tenderness to start it one week prior to admission he went and saw his primary care doctor 5 days prior to admission who started him on Doxy. But she progressively got worse. He relates he had some fevers so he came here to the ED. Relates no nausea vomiting.  In the ED: He had a 90 of the abdominal wall abscess. Chest x-ray was done that shows results below, CBC was done that shows a white count with a left shift, INR 3.6, he is mildly hyponatremic and hypochloremic. And we will consulted for further  evaluation.  Hospital Course:  1-Abdominal wall cellulitis, Abscess;  Received  IV vancomycin and ceftriaxone for 4 days.  He will be discharge on clindamycin and keflex.  Wound care consulted. Continue with dressing changes BID: POC that includes cleansing/irrigating wound, then filling of dead space (defect) with an antimicrobial gauze packing strip (iodoform). This will be performed twice daily for 21 days or until healed.  Redness, edema improving. WBC normalized.   2-Diabetes: SSI. HBA1 at 10, poor controlled. No further hypoglycemia after decreasing lantus to 15 units.   3-Obesity;counseling provided.   Ileus vs SBO on x ray; Denies abdominal pain. X ray improved dilation. Continue with miralax. Having BM daily,   HTN;on lasix. Spironolactone. Hold lisinopril.   Chronic diastolic HF; continue with lasix.   History of A fib, SP pericardial AVR replacement.  On coumadin.   Procedures:  I and D by ED physician.   Consultations:  none  Discharge Exam: Filed Vitals:   06/07/14 0605  BP: 119/52  Pulse: 61  Temp: 97.6 F (36.4 C)  Resp: 18    General: Alert in no distress.  Cardiovascular: S 1, S 2 RRR Respiratory: CTA Abdomen; abdominal wall with minimal redness, wound with packing, less drainage.   Discharge Instructions You were cared for by a hospitalist during your hospital stay. If you have any questions about your discharge medications or the care you received while you were in the hospital after you are discharged, you can call the unit and asked to speak with the hospitalist on call if the hospitalist  that took care of you is not available. Once you are discharged, your primary care physician will handle any further medical issues. Please note that NO REFILLS for any discharge medications will be authorized once you are discharged, as it is imperative that you return to your primary care physician (or establish a relationship with a primary care physician if  you do not have one) for your aftercare needs so that they can reassess your need for medications and monitor your lab values.  Discharge Instructions    Diet - low sodium heart healthy    Complete by:  As directed      Increase activity slowly    Complete by:  As directed           Current Discharge Medication List    START taking these medications   Details  cephALEXin (KEFLEX) 500 MG capsule Take 1 capsule (500 mg total) by mouth 2 (two) times daily. Qty: 20 capsule, Refills: 0    clindamycin (CLEOCIN) 300 MG capsule Take 2 capsules (600 mg total) by mouth 3 (three) times daily. Qty: 60 capsule, Refills: 0    feeding supplement, GLUCERNA SHAKE, (GLUCERNA SHAKE) LIQD Take 237 mLs by mouth 2 (two) times daily between meals. Qty: 30 Can, Refills: 0    insulin aspart (NOVOLOG) 100 UNIT/ML injection Inject 3 Units into the skin 3 (three) times daily with meals. Qty: 10 mL, Refills: 11      CONTINUE these medications which have CHANGED   Details  furosemide (LASIX) 40 MG tablet Take 1 tablet (40 mg total) by mouth 2 (two) times daily. Qty: 60 tablet, Refills: 0    insulin glargine (LANTUS) 100 UNIT/ML injection Inject 0.15 mLs (15 Units total) into the skin at bedtime. Qty: 10 mL, Refills: 11      CONTINUE these medications which have NOT CHANGED   Details  aspirin EC 81 MG tablet Take 81 mg by mouth daily.    donepezil (ARICEPT) 10 MG tablet Take 1 tablet (10 mg total) by mouth at bedtime. Qty: 30 tablet, Refills: 5    ezetimibe-simvastatin (VYTORIN) 10-40 MG per tablet Take 1 tablet by mouth at bedtime. Qty: 30 tablet, Refills: 3    spironolactone (ALDACTONE) 25 MG tablet take 1 tablet by mouth once daily Qty: 90 tablet, Refills: 3    warfarin (COUMADIN) 5 MG tablet Take 5-7.5 mg by mouth See admin instructions. 1.5 tablets (7.5mg  total) every Monday, Wednesday and Friday.  1 tablet (5mg  total) on Tuesday, Thursday, Saturday and Sunday      STOP taking these  medications     insulin glulisine (APIDRA) 100 UNIT/ML injection      lisinopril (PRINIVIL,ZESTRIL) 5 MG tablet        Allergies  Allergen Reactions  . Potassium-Containing Compounds Anaphylaxis    IV--loss of conciousness  . Metoprolol Succinate Other (See Comments)    Extremely tired      The results of significant diagnostics from this hospitalization (including imaging, microbiology, ancillary and laboratory) are listed below for reference.    Significant Diagnostic Studies: Dg Abd 1 View  06/04/2014   CLINICAL DATA:  Followup ileus.  EXAM: ABDOMEN - 1 VIEW  COMPARISON:  Two-view abdomen x-ray 06/02/2014. CTA aortobifemoral 09/18/2005.  FINDINGS: Interval improvement in the mildly distended small bowel loops in the mid abdomen since yesterday, though 102 mildly distended loops persist. Interval improvement in the mildly dilated sigmoid colon. Moderate to large stool burden throughout the colon. No suggestion of free intraperitoneal  air on the supine image. Calcification in the left mid abdomen corresponds to a cortical calcification in the left kidney identified on prior CT.  IMPRESSION: Improving ileus, though there are persistent mildly distended loops of small bowel in the mid abdomen.   Electronically Signed   By: Evangeline Dakin M.D.   On: 06/04/2014 08:50   Dg Chest Portable 1 View  06/02/2014   CLINICAL DATA:  Abdominal wound for 5 to 7 days. Fever and congestion.  EXAM: PORTABLE CHEST - 1 VIEW  COMPARISON:  08/19/2011  FINDINGS: The cardiac silhouette, mediastinal and hilar contours are slightly prominent but stable. There are chronic bronchitic and interstitial lung changes but no definite acute infiltrate or effusion.  IMPRESSION: Chronic lung changes without definite acute pulmonary findings.   Electronically Signed   By: Kalman Jewels M.D.   On: 06/02/2014 15:46   Dg Abd 2 Views  06/02/2014   CLINICAL DATA:  Nausea, vomiting.  EXAM: ABDOMEN - 2 VIEW  COMPARISON:   None.  FINDINGS: Mildly dilated small bowel loops are noted which may represent ileus or possible partial distal small bowel obstruction. Atherosclerotic calcifications are noted in the pelvis. There is no evidence of pneumoperitoneum. Possible left renal calculus is noted.  IMPRESSION: Mildly dilated small bowel loops are noted which may represent ileus or possibly distal small bowel obstruction. Followup radiographs are recommended.   Electronically Signed   By: Sabino Dick M.D.   On: 06/02/2014 18:01    Microbiology: Recent Results (from the past 240 hour(s))  Culture, blood (routine x 2)     Status: None (Preliminary result)   Collection Time: 06/02/14  7:50 PM  Result Value Ref Range Status   Specimen Description BLOOD RIGHT HAND  Final   Special Requests   Final    BOTTLES DRAWN AEROBIC AND ANAEROBIC BLUE 10CC RED 5CC   Culture  Setup Time   Final    06/03/2014 00:55 Performed at [facility]    Culture   Final           BLOOD CULTURE RECEIVED NO GROWTH TO DATE CULTURE WILL BE HELD FOR 5 DAYS BEFORE ISSUING A FINAL NEGATIVE REPORT Performed at [facility]    Report Status PENDING  Incomplete  Culture, blood (routine x 2)     Status: None (Preliminary result)   Collection Time: 06/02/14  8:03 PM  Result Value Ref Range Status   Specimen Description BLOOD LEFT ARM  Final   Special Requests BOTTLES DRAWN AEROBIC AND ANAEROBIC 10CC  Final   Culture  Setup Time   Final    06/03/2014 00:55 Performed at [facility]    Culture   Final           BLOOD CULTURE RECEIVED NO GROWTH TO DATE CULTURE WILL BE HELD FOR 5 DAYS BEFORE ISSUING A FINAL NEGATIVE REPORT Performed at [facility]    Report Status PENDING  Incomplete  MRSA PCR Screening     Status: None   Collection Time: 06/04/14  1:13 PM  Result Value Ref Range Status   MRSA by PCR NEGATIVE NEGATIVE Final    Comment:        The GeneXpert MRSA Assay (FDA approved for NASAL specimens only), is  one component of a comprehensive MRSA colonization surveillance program. It is not intended to diagnose MRSA infection nor to guide or monitor treatment for MRSA infections.      Labs: Basic Metabolic Panel:  Recent Labs Lab  06/03/14 0448 06/04/14 0830 06/05/14 0645 06/06/14 0606 06/06/14 1515 06/07/14 0906  NA 135* 136* 139 137  --  136*  K 3.9 3.6* 3.7 3.8 4.3 4.4  CL 97 97 99 97  --  95*  CO2 25 25 29 27   --  32  GLUCOSE 98 51* 52* 42*  --  83  BUN 22 18 17 16   --  17  CREATININE 1.17 0.97 0.95 0.93  --  1.01  CALCIUM 9.6 9.4 9.7 9.8  --  10.3  MG  --   --   --   --  1.9  --    Liver Function Tests:  Recent Labs Lab 06/02/14 1341 06/03/14 0448  AST 21 17  ALT 16 12  ALKPHOS 142* 120*  BILITOT 1.0 0.5  PROT 8.3 7.0  ALBUMIN 3.0* 2.5*   No results for input(s): LIPASE, AMYLASE in the last 168 hours. No results for input(s): AMMONIA in the last 168 hours. CBC:  Recent Labs Lab 06/02/14 1341 06/03/14 0448 06/04/14 0830 06/05/14 0645 06/06/14 0606  WBC 13.2* 11.0* 9.7 10.6* 10.5  NEUTROABS 9.8*  --   --   --   --   HGB 15.8 13.6 14.5 14.8 14.8  HCT 46.9 42.0 43.8 44.1 45.0  MCV 89.2 88.8 88.5 88.2 88.8  PLT 205 191 204 229 218   Cardiac Enzymes:  Recent Labs Lab 06/06/14 1515 06/06/14 2040 06/07/14 0400  TROPONINI <0.30 <0.30 <0.30   BNP: BNP (last 3 results) No results for input(s): PROBNP in the last 8760 hours. CBG:  Recent Labs Lab 06/06/14 0811 06/06/14 1200 06/06/14 1723 06/06/14 2301 06/07/14 0738  GLUCAP 87 189* 175* 135* 108*       Signed:  Sadie Hazelett A  [facility] 06/07/2014, 10:50 AM

## 2014-06-09 ENCOUNTER — Encounter: Payer: Self-pay | Admitting: Internal Medicine

## 2014-06-09 ENCOUNTER — Non-Acute Institutional Stay (SKILLED_NURSING_FACILITY): Payer: Medicare Other | Admitting: Internal Medicine

## 2014-06-09 DIAGNOSIS — Z952 Presence of prosthetic heart valve: Secondary | ICD-10-CM

## 2014-06-09 DIAGNOSIS — I5032 Chronic diastolic (congestive) heart failure: Secondary | ICD-10-CM

## 2014-06-09 DIAGNOSIS — Z954 Presence of other heart-valve replacement: Secondary | ICD-10-CM | POA: Diagnosis not present

## 2014-06-09 DIAGNOSIS — I10 Essential (primary) hypertension: Secondary | ICD-10-CM | POA: Diagnosis not present

## 2014-06-09 DIAGNOSIS — IMO0002 Reserved for concepts with insufficient information to code with codable children: Secondary | ICD-10-CM

## 2014-06-09 DIAGNOSIS — E1159 Type 2 diabetes mellitus with other circulatory complications: Secondary | ICD-10-CM

## 2014-06-09 DIAGNOSIS — L03311 Cellulitis of abdominal wall: Secondary | ICD-10-CM

## 2014-06-09 DIAGNOSIS — R5381 Other malaise: Secondary | ICD-10-CM | POA: Diagnosis not present

## 2014-06-09 DIAGNOSIS — E1165 Type 2 diabetes mellitus with hyperglycemia: Secondary | ICD-10-CM

## 2014-06-09 DIAGNOSIS — E1151 Type 2 diabetes mellitus with diabetic peripheral angiopathy without gangrene: Secondary | ICD-10-CM

## 2014-06-09 LAB — CULTURE, BLOOD (ROUTINE X 2)
CULTURE: NO GROWTH
Culture: NO GROWTH

## 2014-06-09 NOTE — Progress Notes (Signed)
Patient ID: [name]., male   DOB: 1934-10-18, 79 y.o.   MRN: [id number]     [facility]   PCP: [name], MD  Code Status: full code  Allergies  Allergen Reactions  . Potassium-Containing Compounds Anaphylaxis    IV--loss of conciousness  . Metoprolol Succinate Other (See Comments)    Extremely tired    Chief Complaint  Patient presents with  . New Admit To SNF     HPI:  79 y/o male patient is here for STR post hospital admission from 06/02/14-06/07/14 with abdominal wall abscess. He was started on iv antibiotics and later switched to po antibiotics. He has PMH of dm, obesity, HTN, CHF. He is seen in his room today and denies any concerns. He has been participating in therapy sessions.  Review of Systems:  Constitutional: Positive for malaise. Negative for fever, chills, diaphoresis.  HENT: Negative for congestion Respiratory: Negative for cough, shortness of breath and wheezing.   Cardiovascular: Negative for chest pain, palpitations Gastrointestinal: Negative for heartburn, nausea, vomiting, abdominal pain, diarrhea and constipation. appetite is fair Genitourinary: Negative for dysuria Musculoskeletal: Negative for back pain, falls Skin: Negative for itching, rash.  Neurological: Negative for dizziness, headaches.  Psychiatric/Behavioral: Negative for depression  Past Medical History  Diagnosis Date  . Erectile dysfunction   . Obesity   . OSA (obstructive sleep apnea)     not using CPAP  . History of nephrolithiasis   . HTN (hypertension)   . Aortal stenosis 2006    biophrostetic  . Atrial fibrillation     onset after CABG--- [facility]  . S/P aortic valve replacement   . CHF (congestive heart failure)     abter CABG  . Pacemaker 2/07    VVI  . PVD (peripheral vascular disease)     CT angio showed- vascular insuff, intestine and RAS bilaterally, andgiogram 02/2009 severe PVD medical  management  . Skin cancer     surgery (nose) 09-2010  . Sick sinus syndrome     [name]  . Hyperlipidemia   . Arthritis   . Gangrene   . Coronary artery disease   . DM type 2 (diabetes mellitus, type 2)     used to see Dr Meredith Pel, now f/u by Dr Larose Kells  . Memory loss    Past Surgical History  Procedure Laterality Date  . Aortic valve replacement      biophrostetic for Ao Stenosis 2006  . Coronary artery bypass graft  2006  . Tonsillectomy    . Cataract extraction, bilateral    . Amputation  01/2010    great toe  . Pacemaker placement  07/2005    Medtronic Sigma SR implanted by Dr Olevia Perches  . Aortic valve replacement  01/17/2011    S/P redo median sternotomy, extracorporeal circulation, redo Aortic Valve Replacement using a 23-mm Edwards pericardial Magna-Ease valve, Dr Ulla Gallo  . Angioplasty / stenting femoral  02/10/10    Left femoral  . Incise and drain abcess Right May 2014    right heel   . Eye surgery    . Abdominal aortagram N/A 11/10/2012    Procedure: ABDOMINAL Maxcine Ham;  Surgeon: Serafina Mitchell, MD;  Location: [facility] CATH LAB;  Service: Cardiovascular;  Laterality: N/A;  . Lower extremity angiogram Left 11/23/2012    Procedure: LOWER EXTREMITY ANGIOGRAM;  Surgeon: Serafina Mitchell, MD;  Location: [facility] CATH LAB;  Service: Cardiovascular;  Laterality: Left;  Social History:   reports that he quit smoking about 25 years ago. His smoking use included Cigarettes. He smoked 0.00 packs per day. He has quit using smokeless tobacco. He reports that he does not drink alcohol or use illicit drugs.  Family History  Problem Relation Age of Onset  . Heart attack Father 9  . Heart disease Father     Before age 86  . Hypertension Father   . Stroke Mother 29  . Cancer Sister     Cervical  . Heart attack Sister   . Heart disease Sister   . Colon cancer Neg Hx   . Prostate cancer Neg Hx   . Diabetes Sister     2 sister w/ DM  . Heart attack Sister   . Heart attack  Sister   . Heart disease Sister 88    AAA  and  Stomach Aneurysm  . Heart disease Sister     Before age 28  . Heart attack Sister 25    Medications: Patient's Medications  New Prescriptions   No medications on file  Previous Medications   ASPIRIN EC 81 MG TABLET    Take 81 mg by mouth daily.   CEPHALEXIN (KEFLEX) 500 MG CAPSULE    Take 1 capsule (500 mg total) by mouth 2 (two) times daily.   CLINDAMYCIN (CLEOCIN) 300 MG CAPSULE    Take 2 capsules (600 mg total) by mouth 3 (three) times daily.   DONEPEZIL (ARICEPT) 10 MG TABLET    Take 1 tablet (10 mg total) by mouth at bedtime.   EZETIMIBE-SIMVASTATIN (VYTORIN) 10-40 MG PER TABLET    Take 1 tablet by mouth at bedtime.   FEEDING SUPPLEMENT, GLUCERNA SHAKE, (GLUCERNA SHAKE) LIQD    Take 237 mLs by mouth 2 (two) times daily between meals.   FUROSEMIDE (LASIX) 40 MG TABLET    Take 1 tablet (40 mg total) by mouth 2 (two) times daily.   INSULIN ASPART (NOVOLOG) 100 UNIT/ML INJECTION    Inject 3 Units into the skin 3 (three) times daily with meals.   INSULIN GLARGINE (LANTUS) 100 UNIT/ML INJECTION    Inject 0.15 mLs (15 Units total) into the skin at bedtime.   SPIRONOLACTONE (ALDACTONE) 25 MG TABLET    take 1 tablet by mouth once daily   WARFARIN (COUMADIN) 5 MG TABLET    Take 5-7.5 mg by mouth See admin instructions. 1.5 tablets (7.5mg  total) every Monday, Wednesday and Friday.  1 tablet (5mg  total) on Tuesday, Thursday, Saturday and Sunday  Modified Medications   No medications on file  Discontinued Medications   No medications on file     Physical Exam: Filed Vitals:   06/09/14 1416  BP: 150/74  Pulse: 69  Temp: 97.7 F (36.5 C)  Resp: 18  Weight: 221 lb (100.245 kg)  SpO2: 99%    General- elderly male in no acute distress Head- atraumatic, normocephalic Eyes- no pallor, no icterus, no discharge Neck- no cervical lymphadenopathy Cardiovascular- normal s1,s2, no murmurss Respiratory- bilateral clear to auscultation, no  wheeze, no rhonchi Abdomen- bowel sounds present, soft, non tender, no guarding Musculoskeletal- able to move all 4 extremities, no leg edema Neurological- no focal deficit Skin- warm and dry, dressing in place on abdomen with drainage noted, mild erythema present Psychiatry- alert and oriented   Labs reviewed: Basic Metabolic Panel:  Recent Labs  06/05/14 0645 06/06/14 0606 06/06/14 1515 06/07/14 0906  NA 139 137  --  136*  K 3.7 3.8 4.3  4.4  CL 99 97  --  95*  CO2 29 27  --  32  GLUCOSE 52* 42*  --  83  BUN 17 16  --  17  CREATININE 0.95 0.93  --  1.01  CALCIUM 9.7 9.8  --  10.3  MG  --   --  1.9  --    Liver Function Tests:  Recent Labs  06/02/14 1341 06/03/14 0448  AST 21 17  ALT 16 12  ALKPHOS 142* 120*  BILITOT 1.0 0.5  PROT 8.3 7.0  ALBUMIN 3.0* 2.5*   No results for input(s): LIPASE, AMYLASE in the last 8760 hours. No results for input(s): AMMONIA in the last 8760 hours. CBC:  Recent Labs  07/18/13 1215 06/02/14 1341  06/05/14 0645 06/06/14 0606 06/07/14 0906  WBC 12.0* 13.2*  < > 10.6* 10.5 10.0  NEUTROABS 7.2 9.8*  --   --   --   --   HGB 14.0 15.8  < > 14.8 14.8 14.9  HCT 41.4 46.9  < > 44.1 45.0 45.7  MCV 90.7 89.2  < > 88.2 88.8 89.8  PLT 183.0 205  < > 229 218 242  < > = values in this interval not displayed. Cardiac Enzymes:  Recent Labs  06/06/14 1515 06/06/14 2040 06/07/14 0400  TROPONINI <0.30 <0.30 <0.30   BNP: Invalid input(s): POCBNP CBG:  Recent Labs  06/06/14 2301 06/07/14 0738 06/07/14 1151  GLUCAP 135* 108* 141*    Assessment/Plan  Physical deconditioning Will have patient work with PT/OT as tolerated to regain strength and restore function.  Fall precautions are in place.  Abdominal wall cellulitis To complete the course of keflex and clindamycin for total 10 days. Continue abdominal wall dressing change and skin care. Monitor wbc and temp curve  Dm  Monitor cbg, continue lantus and premeal  insulin  chf euvolemic on exam, no dyspnea. Continue aldactone and lasix, monitor bmp  AS s/p AVR On coumadin, monitor INR  HTN SBP slightly elevated today, has been within range otherwise, monitor bp readings and adjust medication if needed  Goals of care: short term rehabilitation  Family/ staff Communication: reviewed care plan with patient and nursing supervisor   Labs/tests ordered: cbc with diff, cmp    Blanchie Serve, MD  [place] [phone] (Monday-Friday 8 am - 5 pm) [phone] (afterhours)

## 2014-06-20 ENCOUNTER — Telehealth: Payer: Self-pay | Admitting: Internal Medicine

## 2014-06-20 NOTE — Telephone Encounter (Signed)
Please advise 

## 2014-06-20 NOTE — Telephone Encounter (Signed)
Spoke with Chinwe at [facility] regarding abx. She requested I fax the orders to her at [phone]. Orders faxed to [facility]. Orders as follows: Begin 3 additional days of Clindamycin 300 mg: 2 capsules tid, and Keflex 500 mg 1 capsule bid. Also informed her that Dr. Larose Kells would like Dr. Bubba Camp to assess Pts abdominal wall. Received confirmation fax at 17:31.

## 2014-06-20 NOTE — Telephone Encounter (Signed)
The patient is currently at a nursing home with a abscess at the abdominal wall, wife is concerned because the wound does not look much better and he finished the antibiotics. The patient is now is under the care of  Dr Bubba Camp who rounded on him after he was discharged from the hospital. Plan: We will call [facility] and order 3 more days of the same antibiotics --Keflex and clindamycin--  giving enough time for the patient to be seen again by  the [facility]. I asked the patient's wife to call them at [phone] and ask for him to be seen.

## 2014-06-20 NOTE — Telephone Encounter (Signed)
Caller name:linda Lajara Relation to XK:GYJEHU Call back number:[phone]     Cell [phone] Pharmacy:  Reason for call: pt's wife would like for you to call her back, states pt wound still has not healed and the [facility] is stating that he will not be getting any more antibiotics , states they are having to pack it twice daily. Spouse is concerned without  medication it will get infected needs to know if dr. Larose Kells has to send an order for more antibiotics or what options they have.  Requesting a call back today

## 2014-06-28 ENCOUNTER — Encounter: Payer: Self-pay | Admitting: *Deleted

## 2014-07-13 ENCOUNTER — Encounter: Payer: Self-pay | Admitting: Adult Health

## 2014-07-13 ENCOUNTER — Non-Acute Institutional Stay (SKILLED_NURSING_FACILITY): Payer: Medicare Other | Admitting: Adult Health

## 2014-07-13 DIAGNOSIS — I10 Essential (primary) hypertension: Secondary | ICD-10-CM

## 2014-07-13 DIAGNOSIS — E1159 Type 2 diabetes mellitus with other circulatory complications: Secondary | ICD-10-CM

## 2014-07-13 DIAGNOSIS — L03311 Cellulitis of abdominal wall: Secondary | ICD-10-CM | POA: Diagnosis not present

## 2014-07-13 DIAGNOSIS — I5032 Chronic diastolic (congestive) heart failure: Secondary | ICD-10-CM

## 2014-07-13 DIAGNOSIS — Z954 Presence of other heart-valve replacement: Secondary | ICD-10-CM

## 2014-07-13 DIAGNOSIS — R5381 Other malaise: Secondary | ICD-10-CM | POA: Diagnosis not present

## 2014-07-13 DIAGNOSIS — E1165 Type 2 diabetes mellitus with hyperglycemia: Secondary | ICD-10-CM

## 2014-07-13 DIAGNOSIS — IMO0002 Reserved for concepts with insufficient information to code with codable children: Secondary | ICD-10-CM

## 2014-07-13 DIAGNOSIS — E1151 Type 2 diabetes mellitus with diabetic peripheral angiopathy without gangrene: Secondary | ICD-10-CM

## 2014-07-13 DIAGNOSIS — Z952 Presence of prosthetic heart valve: Secondary | ICD-10-CM

## 2014-07-13 NOTE — Progress Notes (Signed)
Patient ID: Seth Pacific., male   DOB: 1934/07/27, 80 y.o.   MRN: [id number]   07/13/2014  Facility:  Nursing Home Location:  [place] Room Number: 831-D LEVEL OF CARE:  SNF (31)   Chief Complaint  Patient presents with  . Discharge Note    Physical deconditioning, abdominal wall cellulitis, history of aortic valve replacement, hypertension, diabetes mellitus and chronic diastolic CHF    HISTORY OF PRESENT ILLNESS:  This is a 80 year old male who is for discharge home with home health nurse and CNA. He has been admitted to [facility] on 06/07/14 from [facility] with with abdominal wall abscess. He was started on iv antibiotics and later switched to po antibiotics. He has PMH of dm, obesity, HTN, CHF. Patient was admitted to this facility for short-term rehabilitation after the patient's recent hospitalization.  Patient has completed SNF rehabilitation and therapy has cleared the patient for discharge.  PAST MEDICAL HISTORY:  Past Medical History  Diagnosis Date  . Erectile dysfunction   . Obesity   . OSA (obstructive sleep apnea)     not using CPAP  . History of nephrolithiasis   . HTN (hypertension)   . Aortal stenosis 2006    biophrostetic  . Atrial fibrillation     onset after CABG--- [facility]  . S/P aortic valve replacement   . CHF (congestive heart failure)     abter CABG  . Pacemaker 2/07    VVI  . PVD (peripheral vascular disease)     CT angio showed- vascular insuff, intestine and RAS bilaterally, andgiogram 02/2009 severe PVD medical management  . Skin cancer     surgery (nose) 09-2010  . Sick sinus syndrome     [name]  . Hyperlipidemia   . Arthritis   . Gangrene   . Coronary artery disease   . DM type 2 (diabetes mellitus, type 2)     used to see Dr Meredith Pel, now f/u by Dr Larose Kells  . Memory loss     CURRENT MEDICATIONS: Reviewed per MAR/see medication list  Allergies    Allergen Reactions  . Potassium-Containing Compounds Anaphylaxis    IV--loss of conciousness  . Metoprolol Succinate Other (See Comments)    Extremely tired     REVIEW OF SYSTEMS:  GENERAL: no change in appetite, no fatigue, no weight changes, no fever, chills or weakness RESPIRATORY: no cough, SOB, DOE, wheezing, hemoptysis CARDIAC: no chest pain, edema or palpitations GI: no abdominal pain, diarrhea, constipation, heart burn, nausea or vomiting  PHYSICAL EXAMINATION  GENERAL: no acute distress SKIN:  LLQ abdominal wound packed with Iodoform covered with dry dressing NECK: supple, trachea midline, no neck masses, no thyroid tenderness, no thyromegaly LYMPHATICS: no LAN in the neck, no supraclavicular LAN RESPIRATORY: breathing is even & unlabored, BS CTAB CARDIAC: RRR, no murmur,no extra heart sounds, no edema GI: abdomen soft, normal BS, no masses, no tenderness, no hepatomegaly, no splenomegaly EXTREMITIES: Able to move 4 extremities PSYCHIATRIC: the patient is alert & oriented to person, affect & behavior appropriate  LABS/RADIOLOGY: 06/12/14  sodium 135 potassium 4.2 glucose 130 BUN 17 creatinine 1.0 calcium 9.9 total protein 7.0 albumin 2.9 ALP 134 AST 44 ALT 64 WBC 9.3 hemoglobin 15.5 hematocrit 48.4 MCV 91.3 Labs reviewed: Basic Metabolic Panel:  Recent Labs  06/05/14 0645 06/06/14 0606 06/06/14 1515 06/07/14 0906  NA 139 137  --  136*  K 3.7 3.8 4.3 4.4  CL 99 97  --  95*  CO2 29 27  --  32  GLUCOSE 52* 42*  --  83  BUN 17 16  --  17  CREATININE 0.95 0.93  --  1.01  CALCIUM 9.7 9.8  --  10.3  MG  --   --  1.9  --    Liver Function Tests:  Recent Labs  06/02/14 1341 06/03/14 0448  AST 21 17  ALT 16 12  ALKPHOS 142* 120*  BILITOT 1.0 0.5  PROT 8.3 7.0  ALBUMIN 3.0* 2.5*   CBC:  Recent Labs  07/18/13 1215 06/02/14 1341  06/05/14 0645 06/06/14 0606 06/07/14 0906  WBC 12.0* 13.2*  < > 10.6* 10.5 10.0  NEUTROABS 7.2 9.8*  --   --   --   --    HGB 14.0 15.8  < > 14.8 14.8 14.9  HCT 41.4 46.9  < > 44.1 45.0 45.7  MCV 90.7 89.2  < > 88.2 88.8 89.8  PLT 183.0 205  < > 229 218 242  < > = values in this interval not displayed.  Lipid Panel:  Recent Labs  07/18/13 1215  HDL 32.30*   Cardiac Enzymes:  Recent Labs  06/06/14 1515 06/06/14 2040 06/07/14 0400  TROPONINI <0.30 <0.30 <0.30   CBG:  Recent Labs  06/06/14 2301 06/07/14 0738 06/07/14 1151  GLUCAP 135* 108* 141*    Dg Chest Portable 1 View  06/02/2014   CLINICAL DATA:  Abdominal wound for 5 to 7 days. Fever and congestion.  EXAM: PORTABLE CHEST - 1 VIEW  COMPARISON:  08/19/2011  FINDINGS: The cardiac silhouette, mediastinal and hilar contours are slightly prominent but stable. There are chronic bronchitic and interstitial lung changes but no definite acute infiltrate or effusion.  IMPRESSION: Chronic lung changes without definite acute pulmonary findings.   Electronically Signed   By: Kalman Jewels M.D.   On: 06/02/2014 15:46   Dg Abd 2 Views  06/02/2014   CLINICAL DATA:  Nausea, vomiting.  EXAM: ABDOMEN - 2 VIEW  COMPARISON:  None.  FINDINGS: Mildly dilated small bowel loops are noted which may represent ileus or possible partial distal small bowel obstruction. Atherosclerotic calcifications are noted in the pelvis. There is no evidence of pneumoperitoneum. Possible left renal calculus is noted.  IMPRESSION: Mildly dilated small bowel loops are noted which may represent ileus or possibly distal small bowel obstruction. Followup radiographs are recommended.   Electronically Signed   By: Sabino Dick M.D.   On: 06/02/2014 18:01    ASSESSMENT/PLAN:  Physical deconditioning - will have home health nurse and CNA Abdominal wall cellulitis - no erythema noted; home health nurse will monitor for wound for s/sx of infection and continue treatment; recently finished course of antibiotic(Clindamycin and Keflex) Diabetes mellitus, type II -  Monitor cbg, continue  lantus and premeal insulin Chronic diastolic chf - stable; Continue aldactone and lasix AS s/p AVR - continue coumadin, monitor INR HTN - well controlled; continue Lasix and Aldactone    I have filled out patient's discharge paperwork and written prescriptions.  Patient will receive home health Nurse and CNA.  Total discharge time: Greater than 30 minutes  Discharge time involved coordination of the discharge process with social worker, nursing staff and therapy department. Medical justification for home health services verified.    [facility], NP [employer] [phone]

## 2014-07-21 ENCOUNTER — Encounter: Payer: Self-pay | Admitting: *Deleted

## 2014-07-23 ENCOUNTER — Other Ambulatory Visit: Payer: Self-pay | Admitting: Cardiology

## 2014-07-25 DIAGNOSIS — E119 Type 2 diabetes mellitus without complications: Secondary | ICD-10-CM | POA: Diagnosis not present

## 2014-07-25 DIAGNOSIS — I97190 Other postprocedural cardiac functional disturbances following cardiac surgery: Secondary | ICD-10-CM | POA: Diagnosis not present

## 2014-07-25 DIAGNOSIS — L02211 Cutaneous abscess of abdominal wall: Secondary | ICD-10-CM | POA: Diagnosis not present

## 2014-07-25 DIAGNOSIS — I97130 Postprocedural heart failure following cardiac surgery: Secondary | ICD-10-CM | POA: Diagnosis not present

## 2014-07-25 DIAGNOSIS — I5032 Chronic diastolic (congestive) heart failure: Secondary | ICD-10-CM | POA: Diagnosis not present

## 2014-07-25 DIAGNOSIS — I2510 Atherosclerotic heart disease of native coronary artery without angina pectoris: Secondary | ICD-10-CM | POA: Diagnosis not present

## 2014-08-02 ENCOUNTER — Ambulatory Visit (INDEPENDENT_AMBULATORY_CARE_PROVIDER_SITE_OTHER): Payer: Medicare Other | Admitting: Interventional Cardiology

## 2014-08-02 DIAGNOSIS — I5032 Chronic diastolic (congestive) heart failure: Secondary | ICD-10-CM | POA: Diagnosis not present

## 2014-08-02 DIAGNOSIS — I97130 Postprocedural heart failure following cardiac surgery: Secondary | ICD-10-CM | POA: Diagnosis not present

## 2014-08-02 DIAGNOSIS — E119 Type 2 diabetes mellitus without complications: Secondary | ICD-10-CM | POA: Diagnosis not present

## 2014-08-02 DIAGNOSIS — I2510 Atherosclerotic heart disease of native coronary artery without angina pectoris: Secondary | ICD-10-CM | POA: Diagnosis not present

## 2014-08-02 DIAGNOSIS — I97190 Other postprocedural cardiac functional disturbances following cardiac surgery: Secondary | ICD-10-CM | POA: Diagnosis not present

## 2014-08-02 DIAGNOSIS — Z5181 Encounter for therapeutic drug level monitoring: Secondary | ICD-10-CM

## 2014-08-02 DIAGNOSIS — L02211 Cutaneous abscess of abdominal wall: Secondary | ICD-10-CM | POA: Diagnosis not present

## 2014-08-02 LAB — POCT INR: INR: 1.4

## 2014-08-09 ENCOUNTER — Telehealth: Payer: Self-pay

## 2014-08-09 ENCOUNTER — Ambulatory Visit (INDEPENDENT_AMBULATORY_CARE_PROVIDER_SITE_OTHER): Payer: PRIVATE HEALTH INSURANCE | Admitting: Cardiovascular Disease

## 2014-08-09 DIAGNOSIS — I97190 Other postprocedural cardiac functional disturbances following cardiac surgery: Secondary | ICD-10-CM | POA: Diagnosis not present

## 2014-08-09 DIAGNOSIS — I97130 Postprocedural heart failure following cardiac surgery: Secondary | ICD-10-CM | POA: Diagnosis not present

## 2014-08-09 DIAGNOSIS — Z5181 Encounter for therapeutic drug level monitoring: Secondary | ICD-10-CM

## 2014-08-09 DIAGNOSIS — I2510 Atherosclerotic heart disease of native coronary artery without angina pectoris: Secondary | ICD-10-CM | POA: Diagnosis not present

## 2014-08-09 DIAGNOSIS — L02211 Cutaneous abscess of abdominal wall: Secondary | ICD-10-CM | POA: Diagnosis not present

## 2014-08-09 DIAGNOSIS — E119 Type 2 diabetes mellitus without complications: Secondary | ICD-10-CM | POA: Diagnosis not present

## 2014-08-09 DIAGNOSIS — I5032 Chronic diastolic (congestive) heart failure: Secondary | ICD-10-CM | POA: Diagnosis not present

## 2014-08-09 LAB — POCT INR: INR: 4.6

## 2014-08-09 NOTE — Telephone Encounter (Signed)
Home Health Certification and Plan of Care received via fax on 08/08/14.  Forms numbered and billing sheet attached.  Forms and billing sheet placed in Dr. Ethel Rana red folder for review and signature.

## 2014-08-09 NOTE — Telephone Encounter (Signed)
Dr. Larose Kells states this needs to be forwarded to [facility], NP to complete. This information faxed back to [facility] successfully. JG//CMA

## 2014-08-16 ENCOUNTER — Ambulatory Visit (INDEPENDENT_AMBULATORY_CARE_PROVIDER_SITE_OTHER): Payer: PRIVATE HEALTH INSURANCE | Admitting: Cardiology

## 2014-08-16 DIAGNOSIS — I97190 Other postprocedural cardiac functional disturbances following cardiac surgery: Secondary | ICD-10-CM | POA: Diagnosis not present

## 2014-08-16 DIAGNOSIS — I2510 Atherosclerotic heart disease of native coronary artery without angina pectoris: Secondary | ICD-10-CM | POA: Diagnosis not present

## 2014-08-16 DIAGNOSIS — Z5181 Encounter for therapeutic drug level monitoring: Secondary | ICD-10-CM

## 2014-08-16 DIAGNOSIS — E119 Type 2 diabetes mellitus without complications: Secondary | ICD-10-CM | POA: Diagnosis not present

## 2014-08-16 DIAGNOSIS — I5032 Chronic diastolic (congestive) heart failure: Secondary | ICD-10-CM | POA: Diagnosis not present

## 2014-08-16 DIAGNOSIS — I97130 Postprocedural heart failure following cardiac surgery: Secondary | ICD-10-CM | POA: Diagnosis not present

## 2014-08-16 DIAGNOSIS — L02211 Cutaneous abscess of abdominal wall: Secondary | ICD-10-CM | POA: Diagnosis not present

## 2014-08-16 LAB — POCT INR: INR: 4.2

## 2014-08-18 ENCOUNTER — Encounter: Payer: Self-pay | Admitting: *Deleted

## 2014-08-23 ENCOUNTER — Ambulatory Visit (INDEPENDENT_AMBULATORY_CARE_PROVIDER_SITE_OTHER): Payer: PRIVATE HEALTH INSURANCE | Admitting: Cardiology

## 2014-08-23 DIAGNOSIS — I97190 Other postprocedural cardiac functional disturbances following cardiac surgery: Secondary | ICD-10-CM | POA: Diagnosis not present

## 2014-08-23 DIAGNOSIS — I97130 Postprocedural heart failure following cardiac surgery: Secondary | ICD-10-CM | POA: Diagnosis not present

## 2014-08-23 DIAGNOSIS — E119 Type 2 diabetes mellitus without complications: Secondary | ICD-10-CM | POA: Diagnosis not present

## 2014-08-23 DIAGNOSIS — Z5181 Encounter for therapeutic drug level monitoring: Secondary | ICD-10-CM

## 2014-08-23 DIAGNOSIS — L02211 Cutaneous abscess of abdominal wall: Secondary | ICD-10-CM | POA: Diagnosis not present

## 2014-08-23 DIAGNOSIS — I2510 Atherosclerotic heart disease of native coronary artery without angina pectoris: Secondary | ICD-10-CM | POA: Diagnosis not present

## 2014-08-23 DIAGNOSIS — I5032 Chronic diastolic (congestive) heart failure: Secondary | ICD-10-CM | POA: Diagnosis not present

## 2014-08-23 LAB — POCT INR: INR: 2.7

## 2014-09-07 ENCOUNTER — Ambulatory Visit (INDEPENDENT_AMBULATORY_CARE_PROVIDER_SITE_OTHER): Payer: Medicare Other | Admitting: Pharmacist Clinician (PhC)/ Clinical Pharmacy Specialist

## 2014-09-07 DIAGNOSIS — Z5181 Encounter for therapeutic drug level monitoring: Secondary | ICD-10-CM

## 2014-09-07 DIAGNOSIS — I359 Nonrheumatic aortic valve disorder, unspecified: Secondary | ICD-10-CM | POA: Diagnosis not present

## 2014-09-07 DIAGNOSIS — Z7901 Long term (current) use of anticoagulants: Secondary | ICD-10-CM

## 2014-09-07 LAB — POCT INR: INR: 2.8

## 2014-09-08 ENCOUNTER — Other Ambulatory Visit: Payer: Self-pay | Admitting: Cardiology

## 2014-10-11 ENCOUNTER — Ambulatory Visit (INDEPENDENT_AMBULATORY_CARE_PROVIDER_SITE_OTHER): Payer: Medicare Other | Admitting: *Deleted

## 2014-10-11 DIAGNOSIS — Z5181 Encounter for therapeutic drug level monitoring: Secondary | ICD-10-CM

## 2014-10-11 DIAGNOSIS — Z7901 Long term (current) use of anticoagulants: Secondary | ICD-10-CM | POA: Diagnosis not present

## 2014-10-11 DIAGNOSIS — I359 Nonrheumatic aortic valve disorder, unspecified: Secondary | ICD-10-CM | POA: Diagnosis not present

## 2014-10-11 LAB — POCT INR: INR: 2.5

## 2014-10-13 ENCOUNTER — Encounter: Payer: Self-pay | Admitting: Gastroenterology

## 2015-01-11 ENCOUNTER — Other Ambulatory Visit: Payer: Self-pay | Admitting: Neurology

## 2015-02-11 ENCOUNTER — Other Ambulatory Visit: Payer: Self-pay | Admitting: Cardiology

## 2015-02-12 ENCOUNTER — Encounter (HOSPITAL_BASED_OUTPATIENT_CLINIC_OR_DEPARTMENT_OTHER): Payer: Medicare Other | Attending: Plastic Surgery

## 2015-02-12 ENCOUNTER — Ambulatory Visit (INDEPENDENT_AMBULATORY_CARE_PROVIDER_SITE_OTHER): Payer: Medicare Other | Admitting: Pharmacist

## 2015-02-12 DIAGNOSIS — I10 Essential (primary) hypertension: Secondary | ICD-10-CM | POA: Insufficient documentation

## 2015-02-12 DIAGNOSIS — Z7901 Long term (current) use of anticoagulants: Secondary | ICD-10-CM

## 2015-02-12 DIAGNOSIS — Z794 Long term (current) use of insulin: Secondary | ICD-10-CM | POA: Diagnosis not present

## 2015-02-12 DIAGNOSIS — F0390 Unspecified dementia without behavioral disturbance: Secondary | ICD-10-CM | POA: Diagnosis not present

## 2015-02-12 DIAGNOSIS — I509 Heart failure, unspecified: Secondary | ICD-10-CM | POA: Diagnosis not present

## 2015-02-12 DIAGNOSIS — I359 Nonrheumatic aortic valve disorder, unspecified: Secondary | ICD-10-CM

## 2015-02-12 DIAGNOSIS — Z5181 Encounter for therapeutic drug level monitoring: Secondary | ICD-10-CM

## 2015-02-12 DIAGNOSIS — L03031 Cellulitis of right toe: Secondary | ICD-10-CM | POA: Insufficient documentation

## 2015-02-12 DIAGNOSIS — E1140 Type 2 diabetes mellitus with diabetic neuropathy, unspecified: Secondary | ICD-10-CM | POA: Insufficient documentation

## 2015-02-12 DIAGNOSIS — I2510 Atherosclerotic heart disease of native coronary artery without angina pectoris: Secondary | ICD-10-CM | POA: Diagnosis not present

## 2015-02-12 DIAGNOSIS — I739 Peripheral vascular disease, unspecified: Secondary | ICD-10-CM | POA: Diagnosis not present

## 2015-02-12 DIAGNOSIS — G4733 Obstructive sleep apnea (adult) (pediatric): Secondary | ICD-10-CM | POA: Insufficient documentation

## 2015-02-12 DIAGNOSIS — M1990 Unspecified osteoarthritis, unspecified site: Secondary | ICD-10-CM | POA: Insufficient documentation

## 2015-02-12 LAB — POCT INR: INR: 3.3

## 2015-02-12 MED ORDER — WARFARIN SODIUM 5 MG PO TABS: 5.0000 mg | ORAL_TABLET | ORAL | Status: DC

## 2015-03-01 ENCOUNTER — Other Ambulatory Visit: Payer: Self-pay | Admitting: Neurology

## 2015-03-02 ENCOUNTER — Other Ambulatory Visit: Payer: Self-pay | Admitting: Physician Assistant

## 2015-03-02 ENCOUNTER — Other Ambulatory Visit: Payer: Self-pay | Admitting: *Deleted

## 2015-03-02 DIAGNOSIS — I5032 Chronic diastolic (congestive) heart failure: Secondary | ICD-10-CM

## 2015-03-02 MED ORDER — FUROSEMIDE 80 MG PO TABS: ORAL_TABLET | ORAL | Status: DC

## 2015-03-02 MED ORDER — FUROSEMIDE 40 MG PO TABS: 40.0000 mg | ORAL_TABLET | Freq: Two times a day (BID) | ORAL | Status: DC

## 2015-03-26 ENCOUNTER — Other Ambulatory Visit: Payer: Self-pay

## 2015-03-26 MED ORDER — LISINOPRIL 5 MG PO TABS: 5.0000 mg | ORAL_TABLET | Freq: Every day | ORAL | Status: DC

## 2015-04-25 ENCOUNTER — Telehealth: Payer: Self-pay

## 2015-04-25 ENCOUNTER — Telehealth: Payer: Self-pay | Admitting: *Deleted

## 2015-04-25 NOTE — Telephone Encounter (Signed)
Spoke to pt and offered him a sooner appt with Jinny Blossom, NP for worsening memory because he was on the wait list. Pt declined, saying they will not have a copay ready in November, and wants to stay on the list and call if there are openings in December with Dr. Jannifer Franklin. I advised pt to call us back if he changes his mind.

## 2015-04-25 NOTE — Telephone Encounter (Signed)
I tried Linda's # but no answer.  I spoke with Mr. Turnage this morning and advised Dr. Jannifer Franklin has had a cancellation for 1130 this morning.  He is not sure if they could be here, will try to reach [facility], have her call me/fim

## 2015-04-25 NOTE — Telephone Encounter (Signed)
LMOM Linda's # that Dr. Jannifer Franklin could see Mr. Osment at 1130 this am/fim

## 2015-05-01 NOTE — Telephone Encounter (Signed)
Pt. has appt. with Dr. Jannifer Franklin on 05-10-15/fim

## 2015-05-04 ENCOUNTER — Other Ambulatory Visit: Payer: Self-pay | Admitting: Cardiology

## 2015-05-10 ENCOUNTER — Ambulatory Visit (INDEPENDENT_AMBULATORY_CARE_PROVIDER_SITE_OTHER): Payer: Medicare Other | Admitting: Neurology

## 2015-05-10 ENCOUNTER — Encounter: Payer: Self-pay | Admitting: Neurology

## 2015-05-10 ENCOUNTER — Encounter (INDEPENDENT_AMBULATORY_CARE_PROVIDER_SITE_OTHER): Payer: Self-pay

## 2015-05-10 ENCOUNTER — Ambulatory Visit (INDEPENDENT_AMBULATORY_CARE_PROVIDER_SITE_OTHER): Payer: Medicare Other

## 2015-05-10 VITALS — BP 133/60 | HR 83 | Ht 70.0 in | Wt 215.0 lb

## 2015-05-10 DIAGNOSIS — I359 Nonrheumatic aortic valve disorder, unspecified: Secondary | ICD-10-CM | POA: Diagnosis not present

## 2015-05-10 DIAGNOSIS — Z5181 Encounter for therapeutic drug level monitoring: Secondary | ICD-10-CM | POA: Diagnosis not present

## 2015-05-10 DIAGNOSIS — R413 Other amnesia: Secondary | ICD-10-CM

## 2015-05-10 DIAGNOSIS — Z7901 Long term (current) use of anticoagulants: Secondary | ICD-10-CM | POA: Diagnosis not present

## 2015-05-10 LAB — POCT INR: INR: 3.1

## 2015-05-10 MED ORDER — DONEPEZIL HCL 10 MG PO TABS: 10.0000 mg | ORAL_TABLET | Freq: Every day | ORAL | Status: DC

## 2015-05-10 NOTE — Progress Notes (Signed)
Reason for visit:  Memory disorder  [name]. is an 80 y.o. male  History of present illness:   [name] is a 80 year old right-handed white male with a history of a memory disorder. The patient has been on Aricept, he was last seen in July 2015. The patient has been tolerating the medication quite well, he has been relatively stable with his memory. The wife indicates that she has difficulty getting him to take a bath or to brush his teeth. He is very inactive during the day, he will sit around and watch TV , he does not get much exercise. The patient sleeps well at night. No other significant new medical issues have come up since last seen. He returns for an evaluation.  Past Medical History  Diagnosis Date  . Erectile dysfunction   . Obesity   . OSA (obstructive sleep apnea)     not using CPAP  . History of nephrolithiasis   . HTN (hypertension)   . Aortal stenosis 2006    biophrostetic  . Atrial fibrillation ([place])     onset after CABG--- [facility]  . S/P aortic valve replacement   . CHF (congestive heart failure) ([place])     abter CABG  . Pacemaker 2/07    VVI  . PVD (peripheral vascular disease) ([name])     CT angio showed- vascular insuff, intestine and RAS bilaterally, andgiogram 02/2009 severe PVD medical management  . Skin cancer     surgery (nose) 09-2010  . Sick sinus syndrome ([name])     [name]  . Hyperlipidemia   . Arthritis   . Gangrene ([place])   . Coronary artery disease   . DM type 2 (diabetes mellitus, type 2) ([place])     used to see Dr Meredith Pel, now f/u by Dr Larose Kells  . Memory loss     Past Surgical History  Procedure Laterality Date  . Aortic valve replacement      biophrostetic for Ao Stenosis 2006  . Coronary artery bypass graft  2006  . Tonsillectomy    . Cataract extraction, bilateral    . Amputation  01/2010    great toe  . Pacemaker placement  07/2005    Medtronic Sigma SR implanted by Dr Olevia Perches  .  Aortic valve replacement  01/17/2011    S/P redo median sternotomy, extracorporeal circulation, redo Aortic Valve Replacement using a 23-mm Edwards pericardial Magna-Ease valve, Dr Ulla Gallo  . Angioplasty / stenting femoral  02/10/10    Left femoral  . Incise and drain abcess Right May 2014    right heel   . Eye surgery    . Abdominal aortagram N/A 11/10/2012    Procedure: ABDOMINAL Maxcine Ham;  Surgeon: Serafina Mitchell, MD;  Location: [facility] CATH LAB;  Service: Cardiovascular;  Laterality: N/A;  . Lower extremity angiogram Left 11/23/2012    Procedure: LOWER EXTREMITY ANGIOGRAM;  Surgeon: Serafina Mitchell, MD;  Location: [facility] CATH LAB;  Service: Cardiovascular;  Laterality: Left;    Family History  Problem Relation Age of Onset  . Heart attack Father 11  . Heart disease Father     Before age 66  . Hypertension Father   . Stroke Mother 51  . Cancer Sister     Cervical  . Heart attack Sister   . Heart disease Sister   . Colon cancer Neg Hx   . Prostate cancer Neg Hx   . Diabetes Sister  2 sister w/ DM  . Heart attack Sister   . Heart attack Sister   . Heart disease Sister 58    AAA  and  Stomach Aneurysm  . Heart disease Sister     Before age 54  . Heart attack Sister 50    Social history:  reports that he quit smoking about 26 years ago. His smoking use included Cigarettes. He has quit using smokeless tobacco. He reports that he does not drink alcohol or use illicit drugs.    Allergies  Allergen Reactions  . Potassium-Containing Compounds Anaphylaxis    IV--loss of conciousness  . Metoprolol Succinate Other (See Comments)    Extremely tired    Medications:  Prior to Admission medications   Medication Sig Start Date End Date Taking? Authorizing Provider  donepezil (ARICEPT) 10 MG tablet Take 1 tablet (10 mg total) by mouth at bedtime. 05/10/15  Yes Kathrynn Ducking, MD  ezetimibe-simvastatin (VYTORIN) 10-40 MG per tablet Take 1 tablet by mouth at bedtime. 05/06/12  Yes  Larey Dresser, MD  furosemide (LASIX) 80 MG tablet Take 1 tablet (80 mg) every morning.  Every other day, take 1/2 tablet (40 mg) in the afternoon. 03/02/15  Yes Larey Dresser, MD  insulin glargine (LANTUS) 100 UNIT/ML injection Inject 0.15 mLs (15 Units total) into the skin at bedtime. Patient taking differently: Inject 50 Units into the skin at bedtime.  06/07/14  Yes Belkys A Regalado, MD  Insulin Glulisine (APIDRA [place]) Inject 40 Units into the skin 3 (three) times daily with meals.   Yes Historical Provider, MD  lisinopril (PRINIVIL,ZESTRIL) 5 MG tablet Take 1 tablet (5 mg total) by mouth daily. 03/26/15  Yes Liliane Shi, PA-C  spironolactone (ALDACTONE) 25 MG tablet take 1 tablet by mouth once daily 05/07/15  Yes Larey Dresser, MD  warfarin (COUMADIN) 5 MG tablet Take 1 tablet (5 mg total) by mouth as directed. Take as directed by [facility] 02/12/15  Yes Burnell Blanks, MD    ROS:  Out of a complete 14 system review of symptoms, the patient complains only of the following symptoms, and all other reviewed systems are negative.   Decreased activity  Hearing loss  Loss of vision, blurred vision  Leg swelling  Frequency of urination  Walking difficulty  Frequent waking, daytime sleepiness  Memory loss  Agitation, behavior problems  Blood pressure 133/60, pulse 83, height 5\' 10"  (1.778 m), weight 215 lb (97.523 kg).  Physical Exam  General: The patient is alert and cooperative at the time of the examination. The patient is moderately obese.  Skin:  1+ edema of ankles was noted bilaterally.   Neurologic Exam  Mental status: The patient is alert and oriented x 2 at the time of the examination ( not oriented to date). The  Mini-Mental status examination done today shows a total score 21/30. The patient is able to name 13 animals in 30 seconds.   Cranial nerves: Facial symmetry is present. Speech is normal, no aphasia or dysarthria is noted. Extraocular movements  are full. Visual fields are full.  Motor: The patient has good strength in all 4 extremities.  Sensory examination: Soft touch sensation is symmetric on the face, arms, and legs.  Coordination: The patient has good finger-nose-finger and heel-to-shin bilaterally.  Gait and station: The patient has a slightly wide-based gait. Tandem gait is  Unsteady. Romberg is negative. No drift is seen.  Reflexes: Deep tendon reflexes are symmetric.   Assessment/Plan:  1. Memory disturbance  The patient remains on Aricept, he is tolerating the medication well. He does have some issues with agitation at times, we have discussed the possibility of starting Prozac or Lexapro for this, but they do not wish to consider this at this time. The patient will follow-up in one year, sooner if needed. He has been remarkably stable with his cognitive functioning over the last year.  Jill Alexanders MD 05/10/2015 4:40 PM  [facility] [address] [place],  [address]  Phone [phone] Fax [phone]

## 2015-05-21 ENCOUNTER — Ambulatory Visit: Payer: Medicare Other | Admitting: Family

## 2015-05-21 ENCOUNTER — Encounter (HOSPITAL_COMMUNITY): Payer: Medicare Other

## 2015-05-21 ENCOUNTER — Other Ambulatory Visit: Payer: Self-pay | Admitting: Surgery

## 2015-05-21 ENCOUNTER — Other Ambulatory Visit (HOSPITAL_COMMUNITY): Payer: Medicare Other

## 2015-05-21 ENCOUNTER — Ambulatory Visit (HOSPITAL_COMMUNITY)
Admission: RE | Admit: 2015-05-21 | Discharge: 2015-05-21 | Disposition: A | Discharge status: Home / Self Care | Payer: Medicare Other | Source: Ambulatory Visit | Attending: Family | Admitting: Family

## 2015-05-21 ENCOUNTER — Ambulatory Visit (INDEPENDENT_AMBULATORY_CARE_PROVIDER_SITE_OTHER)
Admission: RE | Admit: 2015-05-21 | Discharge: 2015-05-21 | Disposition: A | Discharge status: Home / Self Care | Payer: Medicare Other | Source: Ambulatory Visit | Attending: Family | Admitting: Family

## 2015-05-21 DIAGNOSIS — E119 Type 2 diabetes mellitus without complications: Secondary | ICD-10-CM | POA: Diagnosis not present

## 2015-05-21 DIAGNOSIS — Z48812 Encounter for surgical aftercare following surgery on the circulatory system: Secondary | ICD-10-CM

## 2015-05-21 DIAGNOSIS — E785 Hyperlipidemia, unspecified: Secondary | ICD-10-CM | POA: Insufficient documentation

## 2015-05-21 DIAGNOSIS — I739 Peripheral vascular disease, unspecified: Secondary | ICD-10-CM

## 2015-05-21 DIAGNOSIS — I10 Essential (primary) hypertension: Secondary | ICD-10-CM | POA: Diagnosis not present

## 2015-05-23 ENCOUNTER — Encounter: Payer: Self-pay | Admitting: Family

## 2015-05-28 ENCOUNTER — Ambulatory Visit: Payer: Medicare Other | Admitting: Family

## 2015-06-05 ENCOUNTER — Other Ambulatory Visit: Payer: Self-pay | Admitting: Cardiology

## 2015-06-08 ENCOUNTER — Encounter: Payer: Self-pay | Admitting: Family

## 2015-06-13 ENCOUNTER — Ambulatory Visit (INDEPENDENT_AMBULATORY_CARE_PROVIDER_SITE_OTHER): Payer: Medicare Other | Admitting: Family

## 2015-06-13 ENCOUNTER — Ambulatory Visit (INDEPENDENT_AMBULATORY_CARE_PROVIDER_SITE_OTHER): Payer: Medicare Other | Admitting: *Deleted

## 2015-06-13 ENCOUNTER — Encounter: Payer: Self-pay | Admitting: Family

## 2015-06-13 VITALS — BP 140/67 | HR 60 | Ht 70.0 in | Wt 218.0 lb

## 2015-06-13 DIAGNOSIS — Z959 Presence of cardiac and vascular implant and graft, unspecified: Secondary | ICD-10-CM

## 2015-06-13 DIAGNOSIS — I359 Nonrheumatic aortic valve disorder, unspecified: Secondary | ICD-10-CM | POA: Diagnosis not present

## 2015-06-13 DIAGNOSIS — Z48812 Encounter for surgical aftercare following surgery on the circulatory system: Secondary | ICD-10-CM

## 2015-06-13 DIAGNOSIS — Z5181 Encounter for therapeutic drug level monitoring: Secondary | ICD-10-CM

## 2015-06-13 DIAGNOSIS — I739 Peripheral vascular disease, unspecified: Secondary | ICD-10-CM

## 2015-06-13 DIAGNOSIS — Z7901 Long term (current) use of anticoagulants: Secondary | ICD-10-CM | POA: Diagnosis not present

## 2015-06-13 DIAGNOSIS — Z23 Encounter for immunization: Secondary | ICD-10-CM | POA: Diagnosis not present

## 2015-06-13 LAB — POCT INR: INR: 2.7

## 2015-06-13 NOTE — Patient Instructions (Signed)
Peripheral Vascular Disease Peripheral vascular disease (PVD) is a disease of the blood vessels that are not part of your heart and brain. A simple term for PVD is poor circulation. In most cases, PVD narrows the blood vessels that carry blood from your heart to the rest of your body. This can result in a decreased supply of blood to your arms, legs, and internal organs, like your stomach or kidneys. However, it most often affects a person's lower legs and feet. There are two types of PVD.  Organic PVD. This is the more common type. It is caused by damage to the structure of blood vessels.  Functional PVD. This is caused by conditions that make blood vessels contract and tighten (spasm). Without treatment, PVD tends to get worse over time. PVD can also lead to acute ischemic limb. This is when an arm or limb suddenly has trouble getting enough blood. This is a medical emergency. CAUSES Each type of PVD has many different causes. The most common cause of PVD is buildup of a fatty material (plaque) inside of your arteries (atherosclerosis). Small amounts of plaque can break off from the walls of the blood vessels and become lodged in a smaller artery. This blocks blood flow and can cause acute ischemic limb. Other common causes of PVD include:  Blood clots that form inside of blood vessels.  Injuries to blood vessels.  Diseases that cause inflammation of blood vessels or cause blood vessel spasms.  Health behaviors and health history that increase your risk of developing PVD. RISK FACTORS  You may have a greater risk of PVD if you:  Have a family history of PVD.  Have certain medical conditions, including:  High cholesterol.  Diabetes.  High blood pressure (hypertension).  Coronary heart disease.  Past problems with blood clots.  Past injury, such as burns or a broken bone. These may have damaged blood vessels in your limbs.  Buerger disease. This is caused by inflamed blood  vessels in your hands and feet.  Some forms of arthritis.  Rare birth defects that affect the arteries in your legs.  Use tobacco.  Do not get enough exercise.  Are obese.  Are age 50 or older. SIGNS AND SYMPTOMS  PVD may cause many different symptoms. Your symptoms depend on what part of your body is not getting enough blood. Some common signs and symptoms include:  Cramps in your lower legs. This may be a symptom of poor leg circulation (claudication).  Pain and weakness in your legs while you are physically active that goes away when you rest (intermittent claudication).  Leg pain when at rest.  Leg numbness, tingling, or weakness.  Coldness in a leg or foot, especially when compared with the other leg.  Skin or hair changes. These can include:  Hair loss.  Shiny skin.  Pale or bluish skin.  Thick toenails.  Inability to get or maintain an erection (erectile dysfunction). People with PVD are more prone to developing ulcers and sores on their toes, feet, or legs. These may take longer than normal to heal. DIAGNOSIS Your health care provider may diagnose PVD from your signs and symptoms. The health care provider will also do a physical exam. You may have tests to find out what is causing your PVD and determine its severity. Tests may include:  Blood pressure recordings from your arms and legs and measurements of the strength of your pulses (pulse volume recordings).  Imaging studies using sound waves to take pictures of   the blood flow through your blood vessels (Doppler ultrasound).  Injecting a dye into your blood vessels before having imaging studies using:  X-rays (angiogram or arteriogram).  Computer-generated X-rays (CT angiogram).  A powerful electromagnetic field and a computer (magnetic resonance angiogram or MRA). TREATMENT Treatment for PVD depends on the cause of your condition and the severity of your symptoms. It also depends on your age. Underlying  causes need to be treated and controlled. These include long-lasting (chronic) conditions, such as diabetes, high cholesterol, and high blood pressure. You may need to first try making lifestyle changes and taking medicines. Surgery may be needed if these do not work. Lifestyle changes may include:  Quitting smoking.  Exercising regularly.  Following a low-fat, low-cholesterol diet. Medicines may include:  Blood thinners to prevent blood clots.  Medicines to improve blood flow.  Medicines to improve your blood cholesterol levels. Surgical procedures may include:  A procedure that uses an inflated balloon to open a blocked artery and improve blood flow (angioplasty).  A procedure to put in a tube (stent) to keep a blocked artery open (stent implant).  Surgery to reroute blood flow around a blocked artery (peripheral bypass surgery).  Surgery to remove dead tissue from an infected wound on the affected limb.  Amputation. This is surgical removal of the affected limb. This may be necessary in cases of acute ischemic limb that are not improved through medical or surgical treatments. HOME CARE INSTRUCTIONS  Take medicines only as directed by your health care provider.  Do not use any tobacco products, including cigarettes, chewing tobacco, or electronic cigarettes. If you need help quitting, ask your health care provider.  Lose weight if you are overweight, and maintain a healthy weight as directed by your health care provider.  Eat a diet that is low in fat and cholesterol. If you need help, ask your health care provider.  Exercise regularly. Ask your health care provider to suggest some good activities for you.  Use compression stockings or other mechanical devices as directed by your health care provider.  Take good care of your feet.  Wear comfortable shoes that fit well.  Check your feet often for any cuts or sores. SEEK MEDICAL CARE IF:  You have cramps in your legs  while walking.  You have leg pain when you are at rest.  You have coldness in a leg or foot.  Your skin changes.  You have erectile dysfunction.  You have cuts or sores on your feet that are not healing. SEEK IMMEDIATE MEDICAL CARE IF:  Your arm or leg turns cold and blue.  Your arms or legs become red, warm, swollen, painful, or numb.  You have chest pain or trouble breathing.  You suddenly have weakness in your face, arm, or leg.  You become very confused or lose the ability to speak.  You suddenly have a very bad headache or lose your vision.   This information is not intended to replace advice given to you by your health care provider. Make sure you discuss any questions you have with your health care provider.   Document Released: 07/17/2004 Document Revised: 06/30/2014 Document Reviewed: 11/17/2013 [employer] Interactive Patient Education 2016 [employer]  

## 2015-06-13 NOTE — Progress Notes (Signed)
[facility] [place] HISTORY AND PHYSICAL -PAD  History of Present Illness [name]. is a 80 y.o. male patient of Dr. Trula Slade. The patient was seen early in 2015 with a right heel ulcer x2 as well as a breakdown of the toe amputation site at the left great toe. He has previously undergone left superficial femoral artery stenting and subintimal recanalization of an occluded left anterior tibial artery with balloon angioplasty in 2011. This was done in the setting of an ischemic left toe ulcer which ultimately required left great toe amputation. In May of 2014, he underwent successful recanalization of an occluded right anterior tibial artery with subsequent balloon angioplasty using a 3 mm balloon. He was brought back several weeks later to address a high-grade stenosis vs. occlusion of his left superficial femoral artery stent. He underwent successful stenting of a nearly occluded left superficial femoral artery stent using a 7 x 1 20 self-expanding stent. This was done in June of 2014. The patient has gone on to receive wound care at the wound center and has now completely healed all of his ulcers.   He states his heel ulcer have healed. He denies any barriers to exercise, he walks to mailbox 400 feet daily.  Pt. denies claudication in legs when walking. Pt. denies rest pain; denies night pain denies non healing ulcers on lower extremites.  He denies history of stroke or TIA.  The patient denies New Medical or Surgical History  Pt Diabetic: Yes, wife states is not in control and states he gets up at night to eat, states his A1C in 2014 was 12.? Pt smoker: former smoker, quit in 1990  Pt meds include: Statin :Yes ASA: Yes Other anticoagulants/antiplatelets: takes coumadin for history of 2 heart valve replacements    Past Medical History  Diagnosis Date  . Erectile dysfunction   . Obesity   . OSA (obstructive sleep apnea)     not using CPAP  . History  of nephrolithiasis   . HTN (hypertension)   . Aortal stenosis 2006    biophrostetic  . Atrial fibrillation ([place])     onset after CABG--- [facility]mad[facility]  . S/P aortic valve replacement   . CHF (congestive heart failure) ([place])     abter CABG  . Pacemaker 2/07    VVI  . PVD (peripheral vascular disease) ([name])     CT angio showed- vascular insuff, intestine and RAS bilaterally, andgiogram 02/2009 severe PVD medical management  . Skin cancer     surgery (nose) 09-2010  . Sick sinus syndrome ([name])     [name]  . Hyperlipidemia   . Arthritis   . Gangrene ([place])   . Coronary artery disease   . DM type 2 (diabetes mellitus, type 2) ([place])     used to see Dr Meredith Pel, now f/u by Dr Larose Kells  . Memory loss     Social History Social History  Substance Use Topics  . Smoking status: Former Smoker    Types: Cigarettes    Quit date: 06/23/1988  . Smokeless tobacco: Former Systems developer  . Alcohol Use: No    Family History Family History  Problem Relation Age of Onset  . Heart attack Father 16  . Heart disease Father     Before age 73  . Hypertension Father   . Stroke Mother 42  . Cancer Sister     Cervical  . Heart attack Sister   . Heart  disease Sister   . Colon cancer Neg Hx   . Prostate cancer Neg Hx   . Diabetes Sister     2 sister w/ DM  . Heart attack Sister   . Heart attack Sister   . Heart disease Sister 57    AAA  and  Stomach Aneurysm  . AAA (abdominal aortic aneurysm) Sister   . Heart disease Sister     Before age 56  . Heart attack Sister 46    Past Surgical History  Procedure Laterality Date  . Aortic valve replacement      biophrostetic for Ao Stenosis 2006  . Coronary artery bypass graft  2006  . Tonsillectomy    . Cataract extraction, bilateral    . Amputation  01/2010    great toe  . Pacemaker placement  07/2005    Medtronic Sigma SR implanted by Dr Olevia Perches  . Aortic valve replacement  01/17/2011    S/P redo median sternotomy,  extracorporeal circulation, redo Aortic Valve Replacement using a 23-mm Edwards pericardial Magna-Ease valve, Dr Ulla Gallo  . Angioplasty / stenting femoral  02/10/10    Left femoral  . Incise and drain abcess Right May 2014    right heel   . Eye surgery    . Abdominal aortagram N/A 11/10/2012    Procedure: ABDOMINAL Maxcine [place];  Surgeon: Serafina Mitchell, MD;  Location: [facility] CATH LAB;  Service: Cardiovascular;  Laterality: N/A;  . Lower extremity angiogram Left 11/23/2012    Procedure: LOWER EXTREMITY ANGIOGRAM;  Surgeon: Serafina Mitchell, MD;  Location: [facility] CATH LAB;  Service: Cardiovascular;  Laterality: Left;    Allergies  Allergen Reactions  . Potassium-Containing Compounds Anaphylaxis    IV--loss of conciousness  . Metoprolol Succinate Other (See Comments)    Extremely tired    Current Outpatient Prescriptions  Medication Sig Dispense Refill  . donepezil (ARICEPT) 10 MG tablet Take 1 tablet (10 mg total) by mouth at bedtime. 30 tablet 11  . ezetimibe-simvastatin (VYTORIN) 10-40 MG per tablet Take 1 tablet by mouth at bedtime. 30 tablet 3  . furosemide (LASIX) 80 MG tablet Take 1 tablet (80 mg) every morning.  Every other day, take 1/2 tablet (40 mg) in the afternoon. 45 tablet 1  . insulin glargine (LANTUS) 100 UNIT/ML injection Inject 0.15 mLs (15 Units total) into the skin at bedtime. (Patient taking differently: Inject 50 Units into the skin at bedtime. ) 10 mL 11  . Insulin Glulisine (APIDRA [place]) Inject 40 Units into the skin 3 (three) times daily with meals.    Marland Kitchen lisinopril (PRINIVIL,ZESTRIL) 5 MG tablet Take 1 tablet (5 mg total) by mouth daily. 30 tablet 5  . spironolactone (ALDACTONE) 25 MG tablet take 1 tablet by mouth once daily 15 tablet 0  . warfarin (COUMADIN) 5 MG tablet Take 1 tablet (5 mg total) by mouth as directed. Take as directed by [facility] 40 tablet 3   No current facility-administered medications for this visit.    ROS: See HPI for pertinent positives  and negatives.   Physical Examination  Filed Vitals:   06/13/15 1130  BP: 140/67  Pulse: 60  Height: 5\' 10"  (1.778 m)  Weight: 218 lb (98.884 kg)  SpO2: 92%   Body mass index is 31.28 kg/(m^2).  General: A&O x 3, WDWN, obese male. Gait: normalEyes: PERRLA. Pulmonary: CTAB, without wheezes , rales or rhonchi. Cardiac: regular rythm , with murmur. Subcutaneous pacemaker left upper chest.     Carotid Bruits Left  Right   Transmitted cardiac murmur Transmitted cardiac murmur  Aorta is not palpable. Radial pulses: are 1+ palpable and =.   VASCULAR EXAM: Extremities with ischemic changes : mild rubor in plantar surfaces of right foot. without Gangrene; without open wounds. Left great toe is surgically absent. Thin black ring surrounding left second toenail. Dry callus at right heel.     LE Pulses LEFT RIGHT   FEMORAL  palpable not palpable    POPLITEAL not palpable  not palpable   POSTERIOR TIBIAL not palpable  not palpable    DORSALIS PEDIS  ANTERIOR TIBIAL faintly palpable  not palpable    Abdomen: soft, NT, no palpated masses, large panus. Skin: no rashes, no ulcers. Musculoskeletal: Generalized deconditioning muscle wasting or atrophy. See Extremities. Neurologic: A&O X 2; Appropriate Affect ; SENSATION: normal; MOTOR FUNCTION: moving all extremities equally, motor strength 4/5 throughout. Speech is fluent/normal. CN 2-12 intact.          Non-Invasive Vascular Imaging: DATE: 05/21/2015 LOWER EXTREMITY ARTERIAL EVALUATION    INDICATION: Stent evaluation    PREVIOUS INTERVENTION(S): Left superficial femoral artery stent 02/10/2010 and 11/23/2012. Left great toe amputation; right PTA of anterior tibial artery 11/10/2012.     DUPLEX EXAM:     RIGHT  LEFT   Peak Systolic Velocity (cm/s) Ratio (if abnormal) Waveform  Peak Systolic Velocity (cm/s) Ratio (if abnormal) Waveform     Artery - Proximal to Stent 90  B     Stent - Origin 75  B     Stent - Proximal 86/166/175 1.93 B     Stent - Mid 79  B     Stent - Distal 53  B      Stent - End 75  B     Artery - Distal to Stent 90  B   Unable to calculate Today's ABI / TBI Unable to calculate  Not calculated Previous ABI / TBI (  ) Not calculated     Waveform:    M - Monophasic       B - Biphasic       T - Triphasic  If Ankle Brachial Index (ABI) or Toe Brachial Index (TBI) performed, please see complete report     ADDITIONAL FINDINGS: Unable to determine ankle brachial indices due to non-compressible vessels.    IMPRESSION: Patent left superficial femoral artery stent with mildly elevated velocities in the proximal to mid segment with hyperplasia noted. Velocities suggest a <50% stenosis.     Compared to the previous exam:  No significant change in comparison to the last exam on 05/15/2014.     ASSESSMENT: [name]. is a 80 y.o. male who is s/p left superficial femoral artery stent on 02/10/10 & 11/23/12, and right ante Wife states pt has dementia, eats anything he wants including during the night, is unsteady on his feet. Thin black ring surrounding left second toenail, dry callus at right heel; both not present at previous visit.  05/21/15 left LE arterial duplex suggest a patent left superficial femoral artery stent with mildly elevated velocities in the proximal to mid segment with hyperplasia noted. Velocities suggest a <50% stenosis. Unable to determine ankle brachial indices due to non-compressible vessels. Right LE with monophasic waveforms, left LE with biphasic waveforms.   Multiple medical problems: uncontrolled DM, CHF, dementia, wife not able to cope with his issues, economic issues, a young relative lives with them who has significant  mental health issues, wife has not been able  to take pt to pt's PCP in about 2 years; will refer to [facility]. I advised wife to have pt see his PCP ASAP re his right side lung rales.  Face to face time with patient was 25 minutes. Over 50% of this time was spent on counseling and coordination of care.   PLAN:  Based on the patient's vascular studies and examination, pt will return to clinic in 3 months with ABI's.  Refer to [facility].  I discussed in depth with the patient the nature of atherosclerosis, and emphasized the importance of maximal medical management including strict control of blood pressure, blood glucose, and lipid levels, obtaining regular exercise, and continued cessation of smoking.  The patient is aware that without maximal medical management the underlying atherosclerotic disease process will progress, limiting the benefit of any interventions.  The patient was given information about PAD including signs, symptoms, treatment, what symptoms should prompt the patient to seek immediate medical care, and risk reduction measures to take.  Clemon Chambers, RN, MSN, FNP-C [facility] Phone: [phone]  Clinic MD: Scot Dock  06/13/2015 12:03 PM

## 2015-06-26 ENCOUNTER — Ambulatory Visit: Payer: Medicare Other | Admitting: Internal Medicine

## 2015-06-28 ENCOUNTER — Other Ambulatory Visit: Payer: Self-pay | Admitting: Cardiology

## 2015-06-29 ENCOUNTER — Telehealth: Payer: Self-pay | Admitting: Cardiology

## 2015-06-29 MED ORDER — FUROSEMIDE 80 MG PO TABS: ORAL_TABLET | ORAL | Status: DC

## 2015-06-29 NOTE — Telephone Encounter (Signed)
[facility] pharmacy requesting a refill for the pt's medication Furosemide 80 mg tablet. Pt has not been seen sent 02/22/14, pt has been asked to make an appointment, pt still has not made an appointment. Please advise

## 2015-06-29 NOTE — Telephone Encounter (Signed)
Pt's Rx sent to pt's pharmacy as requested. Confirmation received.  °

## 2015-06-29 NOTE — Telephone Encounter (Signed)
He needs to make an appointment with Dr McLean/PA/NP before we can refill his medication.   Please contact him to let him know that, the schedulers can help with an appointment time for him.  Once he has the appointment scheduled, his medication can be refilled until his appointment time.

## 2015-07-04 ENCOUNTER — Ambulatory Visit: Payer: Medicare Other | Admitting: Physician Assistant

## 2015-07-11 ENCOUNTER — Other Ambulatory Visit: Payer: Self-pay | Admitting: Cardiovascular Disease

## 2015-07-11 ENCOUNTER — Other Ambulatory Visit: Payer: Self-pay | Admitting: Cardiology

## 2015-07-12 ENCOUNTER — Telehealth: Payer: Self-pay | Admitting: Cardiology

## 2015-07-12 NOTE — Telephone Encounter (Signed)
Patient advised, his application was mailed [phone].

## 2015-07-12 NOTE — Telephone Encounter (Signed)
New message  Pt called to discuss patient assistance form for ezetimibe-simvastatin (VYTORIN) 10-40 MG per tablet. She states that she delivered it about 1 month ago for this medication and hasn't heard anything else about it. Please call

## 2015-07-18 ENCOUNTER — Ambulatory Visit: Payer: Medicare Other | Admitting: Physician Assistant

## 2015-07-24 ENCOUNTER — Ambulatory Visit: Payer: Medicare Other | Admitting: Neurology

## 2015-07-24 NOTE — Progress Notes (Signed)
Cardiology Office Note:    Date:  07/25/2015   ID:  Seth Pacific., DOB [birth year], MRN [name]  PCP:  Seth November, MD  Cardiologist:  Seth. Loralie [name]   Electrophysiologist:  Seth. Thompson [name]   Chief Complaint  Patient presents with  . Coronary Artery Disease    Follow up    History of Present Illness:     Seth Matsuno. is a 81 y.o. male with a hx of AS s/p redo AVR, diastolic CHF, CAD, chronic atrial fibrillation, s/p pacemaker. Patient had redo AVR with #23 Edwards pericardial valve in 7/12. Cath prior to surgery showed stable disease with patent grafts. He follows with [facility] for PAD. He is s/p intervention to the right anterior tibial artery in 5/14 and intervention to the left SFA in 6/14.   Last seen in clinic by me 9/15.  He returns for FU.  He has dementia.  He is here with his wife.  She helps with his history.  He denies chest pain or significant dyspnea. He denies orthopnea, PND, syncope.  LE edema is stable.      Past Medical History  Diagnosis Date  . Erectile dysfunction   . Obesity   . OSA (obstructive sleep apnea)     not using CPAP  . History of nephrolithiasis   . HTN (hypertension)   . Aortal stenosis 2006    biophrostetic  . Atrial fibrillation ([name])     onset after CABG--- coumadin management at [facility] clinic  . S/P aortic valve replacement   . CHF (congestive heart failure) ([name])     abter CABG  . Pacemaker 2/07    VVI  . PVD (peripheral vascular disease) ([name])     CT angio showed- vascular insuff, intestine and RAS bilaterally, andgiogram 02/2009 severe PVD medical management  . Skin cancer     surgery (nose) 09-2010  . Sick sinus syndrome ([name])     [name]  . Hyperlipidemia   . Arthritis   . Gangrene ([name])   . Coronary artery disease   . DM type 2 (diabetes mellitus, type 2) ([name])     used to see Seth [name], now f/u by Seth [name]  . Memory loss   1. Aortic stenosis: s/p bioprosthetic aortic valve replacement in  06/2004. Echo in 7/11 showed mean gradient 35 mmHg across the bioprosthetic valve. Echo in 6/12 showed worsening of the bioprosthetic mean gradient to 46 mmHg. TEE in 7/12 showed only 1 cusp of the bioprosthetic aortic valve moving (heavily calcified), mean 52 mmHg/peak gradient 76 mmHg. Patient had redo AVR (7/12) with #23 Edwards pericardial valve. Echo (8/12) post AVR showed EF 55%, inferior/inferoseptal HK, bioprosthetic aortic valve with mean gradient 16/peak 28, mild MR, moderately dilated RV with mildly decreased RV systolic function, PASP 68 mmHg. Echo (2/13) with EF 55-60%, mild mitral stenosis and mild MR, bioprosthetic aortic valve with mean gradient 14 mmHg, mildly dilated RV with mild to moderate systolic dysfunction, PA systolic pressure 61 mmHg.  2. CAD: CABG with AVR in 1/06. Patient had SVG-OM and SVG-D. LHC (7/12) showed 50% distal RCA lesion extending into PLV and PDA, total occlusion CFX, 50% pLAD, 90% ostial D1, patent SVG-D1 and SVG-OM.  3. Diabetes  4. Hyperlipidemia  5. HTN  6. Sick sinus syndrome with Medtronic dual chamber PCM  7. Obesity  8. OSA: Not using CPAP (cannot tolerate)  9. Chronic diastolic CHF: Echo in 123XX123 showed EF 60-65% with moderate LV  hypertrophy, moderate diastolic dysfunction, mild mitral stenosis, bioprosthetic aortic valve with mean gradient 35 mmHg (moderate stenosis) and PA systolic pressure 69 mmHg. Echo (6/12) showed EF 60-65%, moderate LV hypertrophy, bioprosthetic aortic valve with mean gradient 46 mmHg (severe stenosis) and PA systolic pressure 74 mmHg, mild MR.  10. Nephrolithiasis  11. PAD: Has seen [name] and [name]. Lower extremity angiography in 9/10 showed occlusion of all 3 vessels below the knees in both legs. Patient then had L SFA stent and PTCA left AT by Seth. Trula [name] in 2011. He had amputation of the left great toe in 8/11. 5/14 PTCA to right anterior tibial artery. 6/14 PCI to left SFA.  12. Atrial fibrillation: Chronic, on coumadin. LAA  thrombus noted on TEE in 7/12.  13. Carotid stenosis: carotid dopplers (7/11) with 40-59% bilateral ICA stenosis. Carotids (6/12) with 40-59% bilateral ICA stenosis. Carotids (7/13) with 40-59% bilateral ICA stenosis. Carotids (7/14) with 40-59% bilateral ICA stenosis.  14. Beta blocker intolerance.  15. Dementia   Past Surgical History  Procedure Laterality Date  . Aortic valve replacement      biophrostetic for Ao Stenosis 2006  . Coronary artery bypass graft  2006  . Tonsillectomy    . Cataract extraction, bilateral    . Amputation  01/2010    great toe  . Pacemaker placement  07/2005    Medtronic Sigma SR implanted by [name]  . Aortic valve replacement  01/17/2011    S/P redo median sternotomy, extracorporeal circulation, redo Aortic Valve Replacement using a 23-mm Edwards pericardial Magna-Ease valve, Seth [name]  . Angioplasty / stenting femoral  02/10/10    Left femoral  . Incise and drain abcess Right May 2014    right heel   . Eye surgery    . Abdominal aortagram N/A 11/10/2012    Procedure: ABDOMINAL [name];  Surgeon: Seth Mitchell, MD;  Location[facility] CATH LAB;  Service: Cardiovascular;  Laterality: N/A;  . Lower extremity angiogram Left 11/23/2012    Procedure: LOWER EXTREMITY ANGIOGRAM;  Surgeon: Seth Mitchell, MD;  Location: [facility] CATH LAB;  Service: Cardiovascular;  Laterality: Left;    Current Medications: Outpatient Prescriptions Prior to Visit  Medication Sig Dispense Refill  . donepezil (ARICEPT) 10 MG tablet Take 1 tablet (10 mg total) by mouth at bedtime. 30 tablet 11  . ezetimibe-simvastatin (VYTORIN) 10-40 MG per tablet Take 1 tablet by mouth at bedtime. 30 tablet 3  . furosemide (LASIX) 80 MG tablet Take 1 tablet (80 mg) every morning.  Every other day, take 1/2 tablet (40 mg) in the afternoon. 45 tablet 0  . insulin glargine (LANTUS) 100 UNIT/ML injection Inject 0.15 mLs (15 Units total) into the skin at bedtime. (Patient taking differently: Inject 50  Units into the skin at bedtime. ) 10 mL 11  . Insulin Glulisine (APIDRA [place]) Inject 40 Units into the skin 3 (three) times daily with meals.    Marland Kitchen spironolactone (ALDACTONE) 25 MG tablet take 1 tablet by mouth once daily 30 tablet 0  . warfarin (COUMADIN) 5 MG tablet take 1 tablet by mouth once daily as directed BY COUMADIN CLINIC 40 tablet 0  . lisinopril (PRINIVIL,ZESTRIL) 5 MG tablet Take 1 tablet (5 mg total) by mouth daily. 30 tablet 5   No facility-administered medications prior to visit.     Allergies:   Potassium-containing compounds and Metoprolol succinate   Social History   Social History  . Marital Status: Married    Spouse Name: N/A  .  Number of Children: 2  . Years of Education: N/A   Occupational History  . retired- long distance Administrator     Social History Main Topics  . Smoking status: Former Smoker    Types: Cigarettes    Quit date: 06/23/1988  . Smokeless tobacco: Former Systems developer  . Alcohol Use: No  . Drug Use: No  . Sexual Activity: Not Currently   Other Topics Concern  . None   Social History Narrative   Diet: healthy most of the time--   2 step children      Patient drinks about 3-4 cups of caffeine daily.   Patient is right handed.               Family History:  The patient's family history includes AAA (abdominal aortic aneurysm) in his sister; Cancer in his sister; Diabetes in his sister; Heart attack in his sister, sister, and sister; Heart attack (age of onset: 7) in his father and sister; Heart disease in his father, sister, and sister; Heart disease (age of onset: 48) in his sister; Hypertension in his father; Stroke (age of onset: 38) in his mother. There is no history of Colon cancer or Prostate cancer.   ROS:   Please see the history of present illness.    ROS All other systems reviewed and are negative.   Physical Exam:    VS:  BP 158/74 mmHg  Pulse 72  Ht 5\' 10"  (1.778 m)  Wt 219 lb (99.338 kg)  BMI 31.42 kg/m2   GEN: Well  nourished, well developed, in no acute distress HEENT: normal Neck: no JVD, no masses Cardiac: Normal 99991111, RRR; 2/6 systolic murmur RUSB,trace to 1+ bilat LE edema;  no carotid bruits,   Respiratory:  clear to auscultation bilaterally; no wheezing, rhonchi or rales GI: soft, nontender, nondistended,  MS: no deformity or atrophy Skin: warm and dry, no rash Neuro:   no focal deficits  Psych: Alert and oriented x 3, normal affect  Wt Readings from Last 3 Encounters:  07/25/15 219 lb (99.338 kg)  06/13/15 218 lb (98.884 kg)  05/10/15 215 lb (97.523 kg)      Studies/Labs Reviewed:     EKG:  EKG is  ordered today.  The ekg ordered today demonstrates V paced, HR 72  Pacemaker Interrogation Battery life ~ 1 year 3 high VR episodes 5 sec or less Normal function   Recent Labs: No results found for requested labs within last 365 days.   Recent Lipid Panel    Component Value Date/Time   CHOL 162 07/18/2013 1215   TRIG 172.0* 07/18/2013 1215   HDL 32.30* 07/18/2013 1215   CHOLHDL 5 07/18/2013 1215   VLDL 34.4 07/18/2013 1215   LDLCALC 95 07/18/2013 1215    Additional studies/ records that were reviewed today include:   Echo (2/13):  Mod LVH, EF 55-60%, no RWMA, AVR ok (mean 14 mmHg), MAC, mild MS, mild MR, mod LAE, mild to mod reduced RVF, mild RAE, PASP 61 mmHg  Carotid US (8/15):  Bilateral ICA 40-59% >>> FU 1 year   ASSESSMENT:     1. Coronary artery disease involving native coronary artery of native heart without angina pectoris   2. Chronic diastolic heart failure ([place])   3. Chronic atrial fibrillation ([place])   4. Aortic valve disorder   5. Essential hypertension   6. HLD (hyperlipidemia)   7. Carotid stenosis, bilateral   8. Cardiac pacemaker in situ   9. Long term  current use of anticoagulant therapy      PLAN:     In order of problems listed above:  1. CAD -  No angina. Continue aspirin, Vytorin, ACE inhibitor.  2. Chronic diastolic heart failure -  Volume remains stable. Continue current dose of furosemide and spironolactone. Check a followup basic metabolic panel today.  3. Chronic AFib -  Rate controlled. He remains on Coumadin.  4. S/P AVR -  Continue SBE prophylaxis. Last echo in 2013.  Arrange FU echo.  He stopped taking ASA.  He should resume ASA 81 mg QD in addition to Coumadin.    5. HTN - BP elevated.  Increase Lisinopril to 10 mg QD.  Check BMET today and repeat in 1 week.   6. HL -  Continue statin.  LDL in 1/15 was 95.  He has been out of Vytorin.  Consider FU Lipids at next visit.   7. Carotid Stenosis - Overdue for FU on carotids.  Arrange Carotid US.  Continue statin.   8. S/p Pacemaker - Device interrogated today.  Normal function. He has ~1 year of battery life.  FU with pacer clinic in 2 mos and Seth. Thompson [name] in 6 mos.   9. Coumadin Rx - INR checked in Coumadin Clinic today was 5.4.  PT/INR pending.     Medication Adjustments/Labs and Tests Ordered: Current medicines are reviewed at length with the patient today.  Concerns regarding medicines are outlined above.  Medication changes, Labs and Tests ordered today are outlined in the Patient Instructions noted below. Patient Instructions  Medication Instructions:  1. INCREASE LISINOPRIL TO 10 MG DAILY; NEW RX SENT IN TODAY  Labwork: 1. BMET TODAY 2. BMET IN 1 WEEK SAME DAY AS CVRR APPT  Testing/Procedures: 1. Your physician has requested that you have an echocardiogram. Echocardiography is a painless test that uses sound waves to create images of your heart. It provides your doctor with information about the size and shape of your heart and how well your heart's chambers and valves are working. This procedure takes approximately one hour. There are no restrictions for this procedure.  2. Your physician has requested that you have a carotid duplex. This test is an ultrasound of the carotid arteries in your neck. It looks at blood flow through these arteries that  supply the brain with blood. Allow one hour for this exam. There are no restrictions or special instructions.  Follow-Up: 1. Your physician wants you to follow-up in: [place] Seth. [facility] You will receive a reminder letter in the mail two months in advance. If you don't receive a letter, please call our office to schedule the follow-up appointment.  2. Your physician wants you to follow-up in: [place] Seth. Aundra Dubin You will receive a reminder letter in the mail two months in advance. If you don't receive a letter, please call our office to schedule the follow-up appointment.  3. DEVICE CHECK IN THE CLINIC TO BE DONE IN 2 MONTHS  Any Other Special Instructions Will Be Listed Below (If Applicable).  If you need a refill on your cardiac medications before your next appointment, please call your pharmacy.    Signed, Richardson Dopp, PA-C  07/25/2015 4:30 PM    [facility] [place], [place], [place]  [address] Phone: [phone]; Fax: [phone]

## 2015-07-25 ENCOUNTER — Ambulatory Visit (INDEPENDENT_AMBULATORY_CARE_PROVIDER_SITE_OTHER): Payer: Medicare Other | Admitting: *Deleted

## 2015-07-25 ENCOUNTER — Encounter: Payer: Self-pay | Admitting: Physician Assistant

## 2015-07-25 ENCOUNTER — Ambulatory Visit (INDEPENDENT_AMBULATORY_CARE_PROVIDER_SITE_OTHER): Payer: Medicare Other | Admitting: Physician Assistant

## 2015-07-25 VITALS — BP 158/74 | HR 72 | Ht 70.0 in | Wt 219.0 lb

## 2015-07-25 DIAGNOSIS — I482 Chronic atrial fibrillation, unspecified: Secondary | ICD-10-CM

## 2015-07-25 DIAGNOSIS — I359 Nonrheumatic aortic valve disorder, unspecified: Secondary | ICD-10-CM

## 2015-07-25 DIAGNOSIS — I5032 Chronic diastolic (congestive) heart failure: Secondary | ICD-10-CM | POA: Diagnosis not present

## 2015-07-25 DIAGNOSIS — Z950 Presence of cardiac pacemaker: Secondary | ICD-10-CM

## 2015-07-25 DIAGNOSIS — Z7901 Long term (current) use of anticoagulants: Secondary | ICD-10-CM

## 2015-07-25 DIAGNOSIS — I10 Essential (primary) hypertension: Secondary | ICD-10-CM

## 2015-07-25 DIAGNOSIS — I2510 Atherosclerotic heart disease of native coronary artery without angina pectoris: Secondary | ICD-10-CM

## 2015-07-25 DIAGNOSIS — E785 Hyperlipidemia, unspecified: Secondary | ICD-10-CM

## 2015-07-25 DIAGNOSIS — Z5181 Encounter for therapeutic drug level monitoring: Secondary | ICD-10-CM

## 2015-07-25 DIAGNOSIS — I6523 Occlusion and stenosis of bilateral carotid arteries: Secondary | ICD-10-CM

## 2015-07-25 LAB — BASIC METABOLIC PANEL
BUN: 20 mg/dL (ref 7–25)
CALCIUM: 10.1 mg/dL (ref 8.6–10.3)
CO2: 31 mmol/L (ref 20–31)
Chloride: 96 mmol/L — ABNORMAL LOW (ref 98–110)
Creat: 1.15 mg/dL (ref 0.70–1.18)
Glucose, Bld: 123 mg/dL — ABNORMAL HIGH (ref 65–99)
POTASSIUM: 3.7 mmol/L (ref 3.5–5.3)
SODIUM: 136 mmol/L (ref 135–146)

## 2015-07-25 LAB — CUP PACEART INCLINIC DEVICE CHECK
Battery Impedance: 4341 Ohm
Battery Voltage: 2.71 V
Date Time Interrogation Session: 20170201203803
Implantable Lead Location: 753860
Lead Channel Setting Pacing Pulse Width: 0.6 ms
Lead Channel Setting Sensing Sensitivity: 2.8 mV
MDC IDC LEAD IMPLANT DT: 20070206
MDC IDC MSMT BATTERY REMAINING LONGEVITY: 14 mo
MDC IDC MSMT LEADCHNL RA IMPEDANCE VALUE: 0 Ohm
MDC IDC MSMT LEADCHNL RV IMPEDANCE VALUE: 704 Ohm
MDC IDC MSMT LEADCHNL RV PACING THRESHOLD AMPLITUDE: 0.5 V
MDC IDC MSMT LEADCHNL RV PACING THRESHOLD PULSEWIDTH: 0.6 ms
MDC IDC SET LEADCHNL RV PACING AMPLITUDE: 2.5 V

## 2015-07-25 LAB — POCT INR: INR: 5.4

## 2015-07-25 MED ORDER — LISINOPRIL 10 MG PO TABS: 10.0000 mg | ORAL_TABLET | Freq: Every day | ORAL | Status: DC

## 2015-07-25 NOTE — Patient Instructions (Addendum)
Medication Instructions:  1. INCREASE LISINOPRIL TO 10 MG DAILY; NEW RX SENT IN TODAY  Labwork: 1. BMET TODAY 2. BMET IN 1 WEEK SAME DAY AS CVRR APPT  Testing/Procedures: 1. Your physician has requested that you have an echocardiogram. Echocardiography is a painless test that uses sound waves to create images of your heart. It provides your doctor with information about the size and shape of your heart and how well your heart's chambers and valves are working. This procedure takes approximately one hour. There are no restrictions for this procedure.  2. Your physician has requested that you have a carotid duplex. This test is an ultrasound of the carotid arteries in your neck. It looks at blood flow through these arteries that supply the brain with blood. Allow one hour for this exam. There are no restrictions or special instructions.  Follow-Up: 1. Your physician wants you to follow-up in: [place] DR. ALLRED You will receive a reminder letter in the mail two months in advance. If you don't receive a letter, please call our office to schedule the follow-up appointment.  2. Your physician wants you to follow-up in: [place] DR. Aundra Dubin You will receive a reminder letter in the mail two months in advance. If you don't receive a letter, please call our office to schedule the follow-up appointment.  3. DEVICE CHECK IN THE CLINIC TO BE DONE IN 2 MONTHS  Any Other Special Instructions Will Be Listed Below (If Applicable).  If you need a refill on your cardiac medications before your next appointment, please call your pharmacy.

## 2015-07-27 NOTE — Progress Notes (Signed)
Add-on pacemaker check in clinic by industry. Normal device function. Thresholds, sensing, impedances consistent with previous measurements. Device programmed to maximize longevity. Device programmed to optimize intrinsic conduction. Estimated longevity 14 months. Patient education completed. ROV with AS in 2 months.

## 2015-08-02 ENCOUNTER — Other Ambulatory Visit (INDEPENDENT_AMBULATORY_CARE_PROVIDER_SITE_OTHER): Payer: Medicare Other | Admitting: *Deleted

## 2015-08-02 ENCOUNTER — Ambulatory Visit (INDEPENDENT_AMBULATORY_CARE_PROVIDER_SITE_OTHER): Payer: Medicare Other

## 2015-08-02 DIAGNOSIS — Z954 Presence of other heart-valve replacement: Secondary | ICD-10-CM

## 2015-08-02 DIAGNOSIS — Z5181 Encounter for therapeutic drug level monitoring: Secondary | ICD-10-CM

## 2015-08-02 DIAGNOSIS — I5032 Chronic diastolic (congestive) heart failure: Secondary | ICD-10-CM | POA: Diagnosis not present

## 2015-08-02 DIAGNOSIS — Z952 Presence of prosthetic heart valve: Secondary | ICD-10-CM

## 2015-08-02 DIAGNOSIS — I359 Nonrheumatic aortic valve disorder, unspecified: Secondary | ICD-10-CM

## 2015-08-02 DIAGNOSIS — Z7901 Long term (current) use of anticoagulants: Secondary | ICD-10-CM

## 2015-08-02 LAB — BASIC METABOLIC PANEL
BUN: 21 mg/dL (ref 7–25)
CALCIUM: 10.3 mg/dL (ref 8.6–10.3)
CO2: 29 mmol/L (ref 20–31)
CREATININE: 1.24 mg/dL — AB (ref 0.70–1.18)
Chloride: 94 mmol/L — ABNORMAL LOW (ref 98–110)
Glucose, Bld: 155 mg/dL — ABNORMAL HIGH (ref 65–99)
POTASSIUM: 3.8 mmol/L (ref 3.5–5.3)
Sodium: 137 mmol/L (ref 135–146)

## 2015-08-02 LAB — POCT INR: INR: 1.9

## 2015-08-03 ENCOUNTER — Telehealth: Payer: Self-pay | Admitting: *Deleted

## 2015-08-03 NOTE — Telephone Encounter (Signed)
No answer on home # for pt. I tried the cell # for pt; woman answered who stated wrong #, please verify cell # with pt. I will try to reach pt later today.

## 2015-08-03 NOTE — Telephone Encounter (Signed)
No answer still on home #, cell # is wrong. I lmom on wife's cell to have ptcb for results.

## 2015-08-06 ENCOUNTER — Encounter: Payer: Self-pay | Admitting: *Deleted

## 2015-08-06 NOTE — Telephone Encounter (Signed)
Lmptcb x 3 to go over lab results and to verify cell# for pt. I will mail out letter to pt today tcb for results.

## 2015-08-09 ENCOUNTER — Telehealth: Payer: Self-pay | Admitting: *Deleted

## 2015-08-09 NOTE — Telephone Encounter (Signed)
Pt has given permission to s/w wife Vaughan Basta. Linda aware of results by phone with verbal understanding. Wife asked for results to be mailed to them. I verified address and confirmed cell [phone] is incorrect. Correct cell [phone].

## 2015-08-10 ENCOUNTER — Other Ambulatory Visit: Payer: Self-pay | Admitting: Cardiology

## 2015-08-11 ENCOUNTER — Other Ambulatory Visit: Payer: Self-pay | Admitting: Cardiology

## 2015-08-13 NOTE — Telephone Encounter (Signed)
REFILL 

## 2015-08-27 ENCOUNTER — Other Ambulatory Visit: Payer: Self-pay | Admitting: Cardiovascular Disease

## 2015-09-05 ENCOUNTER — Ambulatory Visit (INDEPENDENT_AMBULATORY_CARE_PROVIDER_SITE_OTHER): Payer: Medicare Other | Admitting: *Deleted

## 2015-09-05 DIAGNOSIS — I359 Nonrheumatic aortic valve disorder, unspecified: Secondary | ICD-10-CM

## 2015-09-05 DIAGNOSIS — Z7901 Long term (current) use of anticoagulants: Secondary | ICD-10-CM

## 2015-09-05 DIAGNOSIS — Z5181 Encounter for therapeutic drug level monitoring: Secondary | ICD-10-CM

## 2015-09-05 DIAGNOSIS — Z954 Presence of other heart-valve replacement: Secondary | ICD-10-CM | POA: Diagnosis not present

## 2015-09-05 DIAGNOSIS — Z952 Presence of prosthetic heart valve: Secondary | ICD-10-CM

## 2015-09-05 LAB — POCT INR: INR: 5.1

## 2015-09-05 MED ORDER — WARFARIN SODIUM 5 MG PO TABS: ORAL_TABLET | ORAL | Status: DC

## 2015-09-10 ENCOUNTER — Encounter: Payer: Self-pay | Admitting: Family

## 2015-09-17 ENCOUNTER — Encounter (HOSPITAL_COMMUNITY): Payer: Medicare Other

## 2015-09-17 ENCOUNTER — Ambulatory Visit: Payer: Medicare Other | Admitting: Family

## 2015-09-26 ENCOUNTER — Inpatient Hospital Stay (HOSPITAL_COMMUNITY): Admission: RE | Admit: 2015-09-26 | Payer: Medicare Other | Source: Ambulatory Visit

## 2015-09-27 ENCOUNTER — Encounter: Payer: Medicare Other | Admitting: Physician Assistant

## 2015-09-27 NOTE — Progress Notes (Signed)
Cardiology Office Note Date:  09/27/2015  Patient ID:  [name]., DOB 12-16-1934, MRN [id number] PCP:  [name], MD  Cardiologist:  Dr. Aundra Dubin Electrophysiologist: Dr. Rayann Heman  refresh   Chief Complaint:  Scheduled pacer check  History of Present Illness: [name]. is a 81 y.o. male with history of AS s/p redo AVR, diastolic CHF, CAD, chronic atrial fibrillation, s/p pacemaker. Patient had redo AVR with #23 Edwards pericardial valve in 7/12. Cath prior to surgery showed stable disease with patent grafts. He follows with [facility] for PAD. He is s/p intervention to the right anterior tibial artery in 5/14 and intervention to the left SFA in 6/14. he has dimentia, OSA not compliant with CPAP, HTN and HLD.  He comes today being seen for Dr. Rayann Heman feeling   He was last seen by cardiology, Richardson Dopp Jan 2017 noting his PPM battery with approx 1 year of life left and recommended 2 mo f/u.  He was doing well from cardiology standpoint.    cancelled echo and carotid??  bleeding  labs, chem ok, check   device info  Past Medical History  Diagnosis Date  . Erectile dysfunction   . Obesity   . OSA (obstructive sleep apnea)     not using CPAP  . History of nephrolithiasis   . HTN (hypertension)   . Aortal stenosis 2006    biophrostetic  . Atrial fibrillation ([place])     onset after CABG--- coumadin management at Coumadin clinic  . S/P aortic valve replacement   . CHF (congestive heart failure) ([place])     abter CABG  . Pacemaker 2/07    VVI  . PVD (peripheral vascular disease) ([name])     CT angio showed- vascular insuff, intestine and RAS bilaterally, andgiogram 02/2009 severe PVD medical management  . Skin cancer     surgery (nose) 09-2010  . Sick sinus syndrome ([name])     [name]  . Hyperlipidemia   . Arthritis   . Gangrene ([place])   . Coronary artery disease   . DM type 2 (diabetes mellitus, type 2) ([place])     used to see Dr Meredith Pel, now f/u by Dr Larose Kells    . Memory loss     Past Surgical History  Procedure Laterality Date  . Aortic valve replacement      biophrostetic for Ao Stenosis 2006  . Coronary artery bypass graft  2006  . Tonsillectomy    . Cataract extraction, bilateral    . Amputation  01/2010    great toe  . Pacemaker placement  07/2005    Medtronic Sigma SR implanted by Dr Olevia Perches  . Aortic valve replacement  01/17/2011    S/P redo median sternotomy, extracorporeal circulation, redo Aortic Valve Replacement using a 23-mm Edwards pericardial Magna-Ease valve, Dr Ulla Gallo  . Angioplasty / stenting femoral  02/10/10    Left femoral  . Incise and drain abcess Right May 2014    right heel   . Eye surgery    . Abdominal aortagram N/A 11/10/2012    Procedure: ABDOMINAL Maxcine Ham;  Surgeon: Serafina Mitchell, MD;  Location: [facility] CATH LAB;  Service: Cardiovascular;  Laterality: N/A;  . Lower extremity angiogram Left 11/23/2012    Procedure: LOWER EXTREMITY ANGIOGRAM;  Surgeon: Serafina Mitchell, MD;  Location: [facility] LAB;  Service: Cardiovascular;  Laterality: Left;    Current Outpatient Prescriptions  Medication Sig Dispense Refill  . donepezil (ARICEPT) 10 MG tablet  Take 1 tablet (10 mg total) by mouth at bedtime. 30 tablet 11  . ezetimibe-simvastatin (VYTORIN) 10-40 MG per tablet Take 1 tablet by mouth at bedtime. 30 tablet 3  . furosemide (LASIX) 80 MG tablet take 1 tablet by mouth every morning then take 1/2 EVERY OTHER DAY IN THE AFTERNOON 45 tablet 1  . insulin glargine (LANTUS) 100 UNIT/ML injection Inject 0.15 mLs (15 Units total) into the skin at bedtime. (Patient taking differently: Inject 50 Units into the skin at bedtime. ) 10 mL 11  . Insulin Glulisine (APIDRA [place]) Inject 40 Units into the skin 3 (three) times daily with meals.    Marland Kitchen lisinopril (PRINIVIL,ZESTRIL) 10 MG tablet Take 1 tablet (10 mg total) by mouth daily. 90 tablet 3  . spironolactone (ALDACTONE) 25 MG tablet Take 1 tablet (25 mg total) by mouth daily. 30  tablet 11  . warfarin (COUMADIN) 5 MG tablet Take as directed by coumadin clinic 45 tablet 2   No current facility-administered medications for this visit.    Allergies:   Potassium-containing compounds and Metoprolol succinate   Social History:  The patient  reports that he quit smoking about 27 years ago. His smoking use included Cigarettes. He has quit using smokeless tobacco. He reports that he does not drink alcohol or use illicit drugs.   Family History:  The patient's family history includes AAA (abdominal aortic aneurysm) in his sister; Cancer in his sister; Diabetes in his sister; Heart attack in his sister, sister, and sister; Heart attack (age of onset: 52) in his father and sister; Heart disease in his father, sister, and sister; Heart disease (age of onset: 80) in his sister; Hypertension in his father; Stroke (age of onset: 31) in his mother. There is no history of Colon cancer or Prostate cancer.  ROS:  Please see the history of present illness. Otherwise, review of systems is positive for .   All other systems are reviewed and otherwise negative.   PHYSICAL EXAM:  VS:  There were no vitals taken for this visit. BMI: There is no weight on file to calculate BMI. Well nourished, well developed, in no acute distress HEENT: normocephalic, atraumatic Neck: no JVD, carotid bruits or masses Cardiac:  normal S1, S2; RRR; no significant murmurs, no rubs, or gallops Lungs:  clear to auscultation bilaterally, no wheezing, rhonchi or rales Abd: soft, nontender,  + BS MS: no deformity or atrophy Ext: no edema Skin: warm and dry, no rash Neuro:  No gross deficits appreciated Psych: euthymic mood, full affect   PPM/ICD site is stable, no tethering or discomfort   EKG:  Done today shows   Echo (2/13) with EF 55-60%, mild mitral stenosis and mild MR, bioprosthetic aortic valve with mean gradient 14 mmHg, mildly dilated RV with mild to moderate systolic dysfunction, PA systolic  pressure 61 mmHg.   Recent Labs: 08/02/2015: BUN 21; Creat 1.24*; Potassium 3.8; Sodium 137  No results found for requested labs within last 365 days.   CrCl cannot be calculated (Unknown ideal weight.).   Wt Readings from Last 3 Encounters:  07/25/15 219 lb (99.338 kg)  06/13/15 218 lb (98.884 kg)  05/10/15 215 lb (97.523 kg)     Other studies reviewed: Additional studies/records reviewed today include: summarized above  DEVICE information:    ASSESSMENT AND PLAN:   Coumadin or OAC Labs, CHADS  AAD, labs/EKG, testing needed?  site check  remotes  device info  1. PAFib     CHA2S2Vasc is at  least 4 on warfarin, monitored and managed by coumadin clinic   2. Bradycardia, PPM       function      battery   3. CAD      no symptoms      c/w Cardiology  4. PVD      carotid US?      LE interventio     F/u with vasc/cardiology as directed    5. VHD/AVR      echo???   Disposition: F/u with   Current medicines are reviewed at length with the patient today.  The patient did not have any concerns regarding medicines.  Haywood Lasso, PA-C 09/27/2015 6:03 AM     [facility] [address] [place] [address] [phone] (office)  [phone] (fax)    This encounter was created in error - please disregard.

## 2015-10-11 ENCOUNTER — Encounter: Payer: Self-pay | Admitting: Internal Medicine

## 2015-10-12 ENCOUNTER — Ambulatory Visit (INDEPENDENT_AMBULATORY_CARE_PROVIDER_SITE_OTHER): Payer: Medicare Other | Admitting: *Deleted

## 2015-10-12 DIAGNOSIS — Z7901 Long term (current) use of anticoagulants: Secondary | ICD-10-CM | POA: Diagnosis not present

## 2015-10-12 DIAGNOSIS — Z5181 Encounter for therapeutic drug level monitoring: Secondary | ICD-10-CM

## 2015-10-12 DIAGNOSIS — I359 Nonrheumatic aortic valve disorder, unspecified: Secondary | ICD-10-CM

## 2015-10-12 DIAGNOSIS — Z954 Presence of other heart-valve replacement: Secondary | ICD-10-CM

## 2015-10-12 DIAGNOSIS — Z952 Presence of prosthetic heart valve: Secondary | ICD-10-CM

## 2015-10-12 LAB — POCT INR: INR: 5.4

## 2015-10-17 ENCOUNTER — Telehealth: Payer: Self-pay | Admitting: Internal Medicine

## 2015-10-17 NOTE — Telephone Encounter (Signed)
Pt's wife calling to make sure pt won't be charged for no show visit 09-27-15, pt has dementia and wife was not aware of appt-pls call [phone]

## 2015-10-17 NOTE — Telephone Encounter (Signed)
Spoke w/ pt and advised her that PA wanted pt to follow up in 2 months to have pt device interrogated. Pt wife stated that they would not be able to accommodate an appt sooner than July b/c of finances. Pt wife agreed to an appt w/ MD on 01/23/2016 at 2:15 PM. Pt wife wanted to know when pt was due to see primary cardiologist. Informed pt wife that he is due to see primary cardiologist in July 2017 also. Pt wife stated that this would be impossible b/c of finances. Pt wife said to schedule an appt for late September. Pt wife agreed to appt on 03/19/2016 at 2:15 PM. Gave pt wife number to billing b/c she had questions about pt bill.

## 2015-10-17 NOTE — Telephone Encounter (Signed)
Pt's wife calling questioning why pt needs July with [facility] when he is checked once a year and was seen in February-pls call

## 2015-10-18 ENCOUNTER — Telehealth: Payer: Self-pay | Admitting: Internal Medicine

## 2015-10-18 NOTE — Telephone Encounter (Signed)
Opened in error, disregard.

## 2015-10-18 NOTE — Telephone Encounter (Signed)
Returned Mrs Oakey's call regarding no show visit on 09/27/15.  Left vm msg advising that we will not be billing a no show charge for this visit.

## 2015-10-19 ENCOUNTER — Ambulatory Visit (INDEPENDENT_AMBULATORY_CARE_PROVIDER_SITE_OTHER): Payer: Medicare Other | Admitting: *Deleted

## 2015-10-19 DIAGNOSIS — Z954 Presence of other heart-valve replacement: Secondary | ICD-10-CM | POA: Diagnosis not present

## 2015-10-19 DIAGNOSIS — Z5181 Encounter for therapeutic drug level monitoring: Secondary | ICD-10-CM

## 2015-10-19 DIAGNOSIS — I359 Nonrheumatic aortic valve disorder, unspecified: Secondary | ICD-10-CM

## 2015-10-19 DIAGNOSIS — Z952 Presence of prosthetic heart valve: Secondary | ICD-10-CM

## 2015-10-19 DIAGNOSIS — Z7901 Long term (current) use of anticoagulants: Secondary | ICD-10-CM

## 2015-10-19 LAB — POCT INR: INR: 2.6

## 2015-11-01 ENCOUNTER — Ambulatory Visit (INDEPENDENT_AMBULATORY_CARE_PROVIDER_SITE_OTHER): Payer: Medicare Other | Admitting: *Deleted

## 2015-11-01 DIAGNOSIS — I359 Nonrheumatic aortic valve disorder, unspecified: Secondary | ICD-10-CM

## 2015-11-01 DIAGNOSIS — Z7901 Long term (current) use of anticoagulants: Secondary | ICD-10-CM

## 2015-11-01 DIAGNOSIS — Z954 Presence of other heart-valve replacement: Secondary | ICD-10-CM

## 2015-11-01 DIAGNOSIS — Z952 Presence of prosthetic heart valve: Secondary | ICD-10-CM

## 2015-11-01 DIAGNOSIS — Z5181 Encounter for therapeutic drug level monitoring: Secondary | ICD-10-CM | POA: Diagnosis not present

## 2015-11-01 LAB — POCT INR: INR: 2.5

## 2015-11-17 ENCOUNTER — Other Ambulatory Visit: Payer: Self-pay | Admitting: Cardiology

## 2015-12-13 ENCOUNTER — Ambulatory Visit (INDEPENDENT_AMBULATORY_CARE_PROVIDER_SITE_OTHER): Payer: Medicare Other | Admitting: *Deleted

## 2015-12-13 DIAGNOSIS — Z952 Presence of prosthetic heart valve: Secondary | ICD-10-CM

## 2015-12-13 DIAGNOSIS — Z954 Presence of other heart-valve replacement: Secondary | ICD-10-CM | POA: Diagnosis not present

## 2015-12-13 DIAGNOSIS — Z5181 Encounter for therapeutic drug level monitoring: Secondary | ICD-10-CM

## 2015-12-13 DIAGNOSIS — I359 Nonrheumatic aortic valve disorder, unspecified: Secondary | ICD-10-CM

## 2015-12-13 DIAGNOSIS — Z7901 Long term (current) use of anticoagulants: Secondary | ICD-10-CM | POA: Diagnosis not present

## 2015-12-13 LAB — POCT INR: INR: 2.6

## 2016-01-10 ENCOUNTER — Ambulatory Visit (INDEPENDENT_AMBULATORY_CARE_PROVIDER_SITE_OTHER): Payer: Medicare Other | Admitting: *Deleted

## 2016-01-10 DIAGNOSIS — Z954 Presence of other heart-valve replacement: Secondary | ICD-10-CM | POA: Diagnosis not present

## 2016-01-10 DIAGNOSIS — I359 Nonrheumatic aortic valve disorder, unspecified: Secondary | ICD-10-CM

## 2016-01-10 DIAGNOSIS — Z7901 Long term (current) use of anticoagulants: Secondary | ICD-10-CM | POA: Diagnosis not present

## 2016-01-10 DIAGNOSIS — Z5181 Encounter for therapeutic drug level monitoring: Secondary | ICD-10-CM

## 2016-01-10 DIAGNOSIS — Z952 Presence of prosthetic heart valve: Secondary | ICD-10-CM

## 2016-01-10 LAB — POCT INR: INR: 1.7

## 2016-01-23 ENCOUNTER — Encounter: Payer: Medicare Other | Admitting: Internal Medicine

## 2016-03-05 ENCOUNTER — Encounter: Payer: Self-pay | Admitting: Cardiology

## 2016-03-12 ENCOUNTER — Encounter: Payer: Self-pay | Admitting: Internal Medicine

## 2016-03-12 ENCOUNTER — Ambulatory Visit (INDEPENDENT_AMBULATORY_CARE_PROVIDER_SITE_OTHER): Payer: Medicare Other | Admitting: *Deleted

## 2016-03-12 ENCOUNTER — Ambulatory Visit (INDEPENDENT_AMBULATORY_CARE_PROVIDER_SITE_OTHER): Payer: Medicare Other | Admitting: Internal Medicine

## 2016-03-12 VITALS — BP 132/60 | HR 68 | Ht 70.0 in | Wt 201.4 lb

## 2016-03-12 DIAGNOSIS — I359 Nonrheumatic aortic valve disorder, unspecified: Secondary | ICD-10-CM

## 2016-03-12 DIAGNOSIS — I482 Chronic atrial fibrillation, unspecified: Secondary | ICD-10-CM

## 2016-03-12 DIAGNOSIS — Z7901 Long term (current) use of anticoagulants: Secondary | ICD-10-CM

## 2016-03-12 DIAGNOSIS — Z5181 Encounter for therapeutic drug level monitoring: Secondary | ICD-10-CM

## 2016-03-12 DIAGNOSIS — Z950 Presence of cardiac pacemaker: Secondary | ICD-10-CM

## 2016-03-12 DIAGNOSIS — I6523 Occlusion and stenosis of bilateral carotid arteries: Secondary | ICD-10-CM

## 2016-03-12 DIAGNOSIS — Z954 Presence of other heart-valve replacement: Secondary | ICD-10-CM | POA: Diagnosis not present

## 2016-03-12 DIAGNOSIS — I442 Atrioventricular block, complete: Secondary | ICD-10-CM | POA: Diagnosis not present

## 2016-03-12 DIAGNOSIS — Z952 Presence of prosthetic heart valve: Secondary | ICD-10-CM

## 2016-03-12 LAB — CUP PACEART INCLINIC DEVICE CHECK
Implantable Lead Implant Date: 20070206
Implantable Lead Location: 753860
Implantable Lead Model: 5076
MDC IDC MSMT LEADCHNL RV PACING THRESHOLD AMPLITUDE: 1 V
MDC IDC MSMT LEADCHNL RV PACING THRESHOLD PULSEWIDTH: 0.6 ms
MDC IDC SESS DTM: 20170920151923

## 2016-03-12 LAB — POCT INR: INR: 1.6

## 2016-03-12 NOTE — Patient Instructions (Signed)
Medication Instructions:  Your physician recommends that you continue on your current medications as directed. Please refer to the Current Medication list given to you today.   Labwork: None ordered   Testing/Procedures: None ordered   Follow-Up: Your physician recommends that you schedule a follow-up appointment in: 4 weeks for device clinic for battery check  Once ERI schedule replacement.  Will need to hold Warfarin for 2 days prior to change out   Any Other Special Instructions Will Be Listed Below (If Applicable).     If you need a refill on your cardiac medications before your next appointment, please call your pharmacy.

## 2016-03-12 NOTE — Progress Notes (Signed)
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 is a 81 y.o. male who presents today for routine electrophysiology followup.  I have not seen him since 2014.  He is elderly and quite frail.  His spouse reports that he has difficulty ambulating due to unsteadiness and she finds it difficult for him to make trips to the office.  Today, he denies symptoms of palpitations, chest pain, shortness of breath,  dizziness, presyncope, or syncope. + dependant edema  The patient is otherwise without complaint today.   Past Medical History:  Diagnosis Date  . Aortal stenosis 2006   biophrostetic  . Arthritis   . Atrial fibrillation ([place])    onset after CABG--- coumadin management at Coumadin clinic  . CHF (congestive heart failure) ([place])    abter CABG  . Coronary artery disease   . DM type 2 (diabetes mellitus, type 2) ([place])    used to see Dr Meredith Pel, now f/u by Dr Larose Kells  . Erectile dysfunction   . Gangrene ([place])   . History of nephrolithiasis   . HTN (hypertension)   . Hyperlipidemia   . Memory loss   . Obesity   . OSA (obstructive sleep apnea)    not using CPAP  . Pacemaker 2/07   VVI  . PVD (peripheral vascular disease) ([name])    CT angio showed- vascular insuff, intestine and RAS bilaterally, andgiogram 02/2009 severe PVD medical management  . S/P aortic valve replacement   . Sick sinus syndrome ([name])    [name]  . Skin cancer    surgery (nose) 09-2010   Past Surgical History:  Procedure Laterality Date  . ABDOMINAL AORTAGRAM N/A 11/10/2012   Procedure: ABDOMINAL Maxcine Ham;  Surgeon: Serafina Mitchell, MD;  Location: [facility] CATH LAB;  Service: Cardiovascular;  Laterality: N/A;  . AMPUTATION  01/2010   great toe  . ANGIOPLASTY / STENTING FEMORAL  02/10/10   Left femoral  . AORTIC VALVE REPLACEMENT     biophrostetic for Ao Stenosis 2006  . AORTIC VALVE REPLACEMENT  01/17/2011   S/P redo median sternotomy, extracorporeal circulation, redo Aortic Valve  Replacement using a 23-mm Edwards pericardial Magna-Ease valve, Dr Ulla Gallo  . CATARACT EXTRACTION, BILATERAL    . CORONARY ARTERY BYPASS GRAFT  2006  . EYE SURGERY    . INCISE AND DRAIN ABCESS Right May 2014   right heel   . LOWER EXTREMITY ANGIOGRAM Left 11/23/2012   Procedure: LOWER EXTREMITY ANGIOGRAM;  Surgeon: Serafina Mitchell, MD;  Location: [facility] CATH LAB;  Service: Cardiovascular;  Laterality: Left;  . PACEMAKER PLACEMENT  07/2005   Medtronic Sigma SR implanted by Dr Olevia Perches  . TONSILLECTOMY      ROS- all systems are reviewed and negative except as per HPI above  Current Outpatient Prescriptions  Medication Sig Dispense Refill  . donepezil (ARICEPT) 10 MG tablet Take 1 tablet (10 mg total) by mouth at bedtime. 30 tablet 11  . ezetimibe-simvastatin (VYTORIN) 10-40 MG per tablet Take 1 tablet by mouth at bedtime. 30 tablet 3  . furosemide (LASIX) 80 MG tablet take 1 tablet by mouth every morning then take 1/2 EVERY OTHER DAY IN THE AFTERNOON 45 tablet 2  . insulin glargine (LANTUS) 100 UNIT/ML injection Inject 0.15 mLs (15 Units total) into the skin at bedtime. 10 mL 11  . Insulin Glulisine (APIDRA [place]) Inject 40 Units into the skin 3 (three) times daily with meals.    Marland Kitchen lisinopril (PRINIVIL,ZESTRIL)  10 MG tablet Take 1 tablet (10 mg total) by mouth daily. 90 tablet 3  . spironolactone (ALDACTONE) 25 MG tablet Take 1 tablet (25 mg total) by mouth daily. 30 tablet 11  . warfarin (COUMADIN) 5 MG tablet Take as directed by coumadin clinic 45 tablet 2   No current facility-administered medications for this visit.     Physical Exam: Vitals:   03/12/16 1426  BP: 132/60  Pulse: 68  Weight: 201 lb 6.4 oz (91.4 kg)  Height: 5\' 10"  (1.778 m)    GEN- The patient is elderly and frail appearing, alert and oriented x 3 today.   Head- normocephalic, atraumatic Eyes-  Sclera clear, conjunctiva pink Ears- hearing intact Oropharynx- clear Lungs- Clear to ausculation bilaterally, normal  work of breathing Chest- pacemaker pocket is well healed Heart- Regular rate and rhythm (paced) GI- soft, NT, ND, + BS Extremities- no clubbing, cyanosis, + dependant edema  Pacemaker interrogation- reviewed in detail today,  See PACEART report  Assessment and Plan:  1. Complete heart block Normal pacemaker function See Pace Art report No changes today Approaching ERI Will need monthly PPM checks in the office (does not do remotes) Importance of compliance discussed with patient and spouse today Risks, benefits, and alternatives to pacemaker pulse generator replacement were discussed in detail today.  The patient understands that risks include but are not limited to bleeding, infection, pneumothorax, perforation, tamponade, vascular damage, renal failure, MI, stroke, death, damage to his existing leads, and lead dislodgement and wishes to proceed once he reaches ERI.  Would hold coumadin for 2 doses prior to gen change.  OK to schedule gen change if he reaches ERI in the next 4-6 months  2. Permanent afib Continue long term anticoagulation  Thompson Grayer MD, [facility] 03/12/2016 2:56 PM

## 2016-03-19 ENCOUNTER — Encounter: Payer: Self-pay | Admitting: Cardiology

## 2016-03-19 ENCOUNTER — Ambulatory Visit (INDEPENDENT_AMBULATORY_CARE_PROVIDER_SITE_OTHER): Payer: Medicare Other | Admitting: Cardiology

## 2016-03-19 VITALS — BP 122/56 | HR 67 | Ht 70.0 in | Wt 195.0 lb

## 2016-03-19 DIAGNOSIS — I2510 Atherosclerotic heart disease of native coronary artery without angina pectoris: Secondary | ICD-10-CM | POA: Diagnosis not present

## 2016-03-19 DIAGNOSIS — I6523 Occlusion and stenosis of bilateral carotid arteries: Secondary | ICD-10-CM

## 2016-03-19 DIAGNOSIS — I482 Chronic atrial fibrillation, unspecified: Secondary | ICD-10-CM

## 2016-03-19 DIAGNOSIS — Z952 Presence of prosthetic heart valve: Secondary | ICD-10-CM | POA: Diagnosis not present

## 2016-03-19 DIAGNOSIS — Z954 Presence of other heart-valve replacement: Secondary | ICD-10-CM | POA: Diagnosis not present

## 2016-03-19 DIAGNOSIS — E785 Hyperlipidemia, unspecified: Secondary | ICD-10-CM

## 2016-03-19 LAB — CBC WITH DIFFERENTIAL/PLATELET
BASOS PCT: 1 %
Basophils Absolute: 87 cells/uL (ref 0–200)
EOS ABS: 348 {cells}/uL (ref 15–500)
Eosinophils Relative: 4 %
HEMATOCRIT: 45.2 % (ref 38.5–50.0)
Hemoglobin: 15.3 g/dL (ref 13.2–17.1)
Lymphocytes Relative: 25 %
Lymphs Abs: 2175 cells/uL (ref 850–3900)
MCH: 29.4 pg (ref 27.0–33.0)
MCHC: 33.8 g/dL (ref 32.0–36.0)
MCV: 86.9 fL (ref 80.0–100.0)
MONO ABS: 783 {cells}/uL (ref 200–950)
MONOS PCT: 9 %
MPV: 11.2 fL (ref 7.5–12.5)
NEUTROS ABS: 5307 {cells}/uL (ref 1500–7800)
Neutrophils Relative %: 61 %
PLATELETS: 217 10*3/uL (ref 140–400)
RBC: 5.2 MIL/uL (ref 4.20–5.80)
RDW: 14.3 % (ref 11.0–15.0)
WBC: 8.7 10*3/uL (ref 3.8–10.8)

## 2016-03-19 LAB — LIPID PANEL
CHOLESTEROL: 133 mg/dL (ref 125–200)
HDL: 36 mg/dL — ABNORMAL LOW (ref >=40)
LDL Cholesterol: 74 mg/dL (ref <130)
Total CHOL/HDL Ratio: 3.7 Ratio (ref <=5.0)
Triglycerides: 114 mg/dL (ref <150)
VLDL: 23 mg/dL (ref <30)

## 2016-03-19 LAB — BASIC METABOLIC PANEL
BUN: 19 mg/dL (ref 7–25)
CALCIUM: 9.3 mg/dL (ref 8.6–10.3)
CO2: 30 mmol/L (ref 20–31)
Chloride: 97 mmol/L — ABNORMAL LOW (ref 98–110)
Creat: 1.17 mg/dL (ref 0.70–1.18)
GLUCOSE: 189 mg/dL — AB (ref 65–99)
Potassium: 3.7 mmol/L (ref 3.5–5.3)
Sodium: 135 mmol/L (ref 135–146)

## 2016-03-19 NOTE — Patient Instructions (Addendum)
Medication Instructions:  Your physician recommends that you continue on your current medications as directed. Please refer to the Current Medication list given to you today.   Labwork: BMET/CBCd/Lipid profile today  Testing/Procedures: Your physician has requested that you have a carotid duplex. This test is an ultrasound of the carotid arteries in your neck. It looks at blood flow through these arteries that supply the brain with blood. Allow one hour for this exam. There are no restrictions or special instructions.  Your physician has requested that you have an echocardiogram. Echocardiography is a painless test that uses sound waves to create images of your heart. It provides your doctor with information about the size and shape of your heart and how well your heart's chambers and valves are working. This procedure takes approximately one hour. There are no restrictions for this procedure.    Follow-Up: Your physician wants you to follow-up in: 6 months with Richardson Dopp, [place]. (March 2018). You will receive a reminder letter in the mail two months in advance. If you don't receive a letter, please call our office to schedule the follow-up appointment.        If you need a refill on your cardiac medications before your next appointment, please call your pharmacy.  ////////////////////////////////////////////////////////////////////////////////////////////////////////////////////////////////////////////////////////////////////////////////////////////////////////////////////////////////////////////////////////////////////////////////////////////////////////////////////////////////////////////////////////////////////////////////////////////////////////////////////////

## 2016-03-20 NOTE — Progress Notes (Signed)
Patient ID: Seth Pacific., male   DOB: Jan 23, 1935, 81 y.o.   MRN: [name] PCP: Dr. Larose Kells  81 yo with history of AS s/p redo AVR, diastolic CHF, CAD, chronic atrial fibrillation, s/p pacemaker presents for cardiology followup.  Patient had redo AVR with #23 Edwards pericardial valve in 7/12.  Cath prior to surgery showed stable disease with patent grafts.  He follows with [facility] for PAD. He had PTCA to the right anterior tibial artery in 5/14 and PCI to the left SFA in 6/14.  I have not seen him since 2015 though he has seen [facility] [place] during that time.  Since I last saw him, he has dwindled significantly.  Dementia is worse.  Wife has a hard time getting him to bathe.  He is very inactive and rarely leaves the house. He denies dyspnea or chest pain.  Balance is worse, no falls though.  Weight is trending down and he does not eat much.  Need pacemaker pulse generator replacement.      Labs (1/12): LDL 71, HDL 29, K 4.7, creatinine 1.1 Labs (4/12): K 4, creatinine 1.0, BNP 262 Labs (7/12): LDL 92, HDL 32 Labs (8/12): K 4.3, creatinine 1.15 => 1.1, BNP 174 Labs (9/12): K 4, creatinine 0.9, BNP 126 Labs (1/13): BNP 130, K 3.6, creatinine 1.5 Labs (3/13): K 4.8, creatinine 1.1 Labs (6/13): LDL 64 Labs (7/13): BNP 446 => 200, K 4.6, creatinine 1.1 Labs (6/14): K 4.6, creatinine 1.3 Labs (2/17): K 3.8, creatinine 1.24  ECG: atrial fibrillation, v-paced.   PMH:  1. Aortic stenosis: s/p bioprosthetic aortic valve replacement in 06/2004.  Echo in 7/11 showed mean gradient 35 mmHg across the bioprosthetic valve.  Echo in 6/12 showed worsening of the bioprosthetic mean gradient to 46 mmHg.  TEE in 7/12 showed only 1 cusp of the bioprosthetic aortic valve moving (heavily calcified), mean 52 mmHg/peak gradient 76 mmHg. Patient had redo AVR (7/12) with #23 Edwards pericardial valve.  Echo (8/12) post AVR showed EF 55%, inferior/inferoseptal HK, bioprosthetic aortic valve with mean gradient 16/peak 28, mild  MR, moderately dilated RV with mildly decreased RV systolic function, PASP 68 mmHg.  Echo (2/13) with EF 55-60%, mild mitral stenosis and mild MR, bioprosthetic aortic valve with mean gradient 14 mmHg, mildly dilated RV with mild to moderate systolic dysfunction, PA systolic pressure 61 mmHg.  2. CAD: CABG with AVR in 1/06.  Patient had SVG-OM and SVG-D.  LHC (7/12) showed 50% distal RCA lesion extending into PLV and PDA, total occlusion CFX, 50% pLAD, 90% ostial D1, patent SVG-D1 and SVG-OM.  3. Diabetes 4. Hyperlipidemia 5. HTN 6. Sick sinus syndrome with Medtronic dual chamber PCM 7. Obesity 8. OSA: Not using CPAP (cannot tolerate) 9. Chronic diastolic CHF: Echo in [id number] showed EF 60-65% with moderate LV hypertrophy, moderate diastolic dysfunction, mild mitral stenosis, bioprosthetic aortic valve with mean gradient 35 mmHg (moderate stenosis) and PA systolic pressure 69 mmHg.   Echo (6/12) showed EF 60-65%, moderate LV hypertrophy, bioprosthetic aortic valve with mean gradient 46 mmHg (severe stenosis) and PA systolic pressure 74 mmHg, mild MR.  10. Nephrolithiasis 11. PAD: Has seen [facility].  Lower extremity angiography in 9/10 showed occlusion of all 3 vessels below the knees in both legs.  Patient then had L SFA stent and PTCA left AT by Dr. Trula Slade in 2011.  He had amputation of the left great toe in 8/11. 5/14 PTCA to right anterior tibial artery.  6/14 PCI to left SFA.  11/16 ABIs inaccurate due to calcification.  12. Atrial fibrillation: Chronic, on coumadin.  LAA thrombus noted on TEE in 7/12.  13. Carotid stenosis: carotid dopplers (7/11) with 40-59% bilateral ICA stenosis.  Carotids (6/12) with 40-59% bilateral ICA stenosis.  Carotids (7/13) with 40-59% bilateral ICA stenosis. Carotids (7/14) with 40-59% bilateral ICA stenosis.  14. Beta blocker intolerance.  20. Dementia  SH: Married, lives in [place].  Retired Administrator.  Works part time at [employer].  Quit smoking in 1990 (smoked heavily prior to this).   FH: Father with MI at 71, mother with CVA at 71  ROS: All systems reviewed and negative except as per HPI.    Current Outpatient Prescriptions  Medication Sig Dispense Refill  . donepezil (ARICEPT) 10 MG tablet Take 1 tablet (10 mg total) by mouth at bedtime. 30 tablet 11  . ezetimibe-simvastatin (VYTORIN) 10-40 MG per tablet Take 1 tablet by mouth at bedtime. 30 tablet 3  . furosemide (LASIX) 80 MG tablet take 1 tablet by mouth every morning then take 1/2 EVERY OTHER DAY IN THE AFTERNOON 45 tablet 2  . insulin glargine (LANTUS) 100 UNIT/ML injection Inject 0.15 mLs (15 Units total) into the skin at bedtime. 10 mL 11  . Insulin Glulisine (APIDRA [place]) Inject 35 Units into the skin 3 (three) times daily with meals.     Marland Kitchen lisinopril (PRINIVIL,ZESTRIL) 10 MG tablet Take 1 tablet (10 mg total) by mouth daily. 90 tablet 3  . spironolactone (ALDACTONE) 25 MG tablet Take 1 tablet (25 mg total) by mouth daily. 30 tablet 11  . warfarin (COUMADIN) 5 MG tablet Take as directed by coumadin clinic 45 tablet 2   No current facility-administered medications for this visit.     BP (!) 122/56   Pulse 67   Ht 5\' 10"  (1.778 m)   Wt 195 lb (88.5 kg)   BMI 27.98 kg/m  General: obese, NAD, disheveled Neck: Thick, JVP 7 cm, no thyromegaly or thyroid nodule.  Lungs: Slight crackles at bases bilaterally. CV: Nondisplaced PMI.  Heart irregular S1/S2, no S3/S4, 2/6 early SEM.  No edema. Unable to palpate pedal pulses.  Bilateral soft carotid bruits. Abdomen: Soft, nontender, no hepatosplenomegaly, no distention.  Neurologic: He does seem to have memory difficulty.  Psych: Normal affect. Extremities: No clubbing or cyanosis. No pedal ulcers but feet appear poorly cared for.  Assessment/Plan: AORTIC VALVE REPLACEMENT Last echo in 2/13 showed normal function of bioprosthetic aortic valve. I am going to arrange for a repeat echocardiogram. Carotid  stenosis Mild to moderate stenosis on prior studies.  He is due for repeat carotid dopplers, I will arrange.  PAD Followed by Dr Trula Slade, s/p interventions in 2014.  No pedal ulcers.  However, feet appear poorly cared for.  I recommended that he see a podiatrist.  He needs to keep his feet cleaner, a foot lesion could potentially lead to seeding of his bioprosthetic aortic valve.  CORONARY ARTERY DISEASE  Stable with patent grafts on 7/12 cath. Denies chest pain.  Continue ASA 81, Vytorin, ACEI. HYPERLIPIDEMIA Check lipids today. Permanent atrial fibrillation  Chronic. LAA thrombus noted on TEE prior to AVR. Continue coumadin. Should be bridged with heparin or Lovenox if he needs to come off coumadin in the future.  CBC today.  Diastolic heart failure NYHA class II symptoms, stable.  He is not significantly volume overloaded.  Continue current Lasix dosing (80 qam and every other day 40 qpm).  Check BMET today.  Dementia Now  on Aricept, overall seems to be worsening.   Given my transition to CHF clinic, will have him see Richardson Dopp in 6 months.   Loralie Champagne 03/20/2016

## 2016-03-21 NOTE — Addendum Note (Signed)
Addended by: Katrine Coho on: 03/21/2016 01:28 PM   Modules accepted: Orders

## 2016-03-28 ENCOUNTER — Other Ambulatory Visit: Payer: Self-pay | Admitting: Cardiology

## 2016-04-23 ENCOUNTER — Ambulatory Visit (INDEPENDENT_AMBULATORY_CARE_PROVIDER_SITE_OTHER): Payer: Medicare Other | Admitting: Pharmacist

## 2016-04-23 ENCOUNTER — Encounter: Payer: Self-pay | Admitting: Internal Medicine

## 2016-04-23 ENCOUNTER — Ambulatory Visit (INDEPENDENT_AMBULATORY_CARE_PROVIDER_SITE_OTHER): Payer: Medicare Other | Admitting: *Deleted

## 2016-04-23 DIAGNOSIS — Z952 Presence of prosthetic heart valve: Secondary | ICD-10-CM

## 2016-04-23 DIAGNOSIS — Z23 Encounter for immunization: Secondary | ICD-10-CM | POA: Diagnosis not present

## 2016-04-23 DIAGNOSIS — I359 Nonrheumatic aortic valve disorder, unspecified: Secondary | ICD-10-CM | POA: Diagnosis not present

## 2016-04-23 DIAGNOSIS — Z950 Presence of cardiac pacemaker: Secondary | ICD-10-CM | POA: Diagnosis not present

## 2016-04-23 DIAGNOSIS — Z7901 Long term (current) use of anticoagulants: Secondary | ICD-10-CM | POA: Diagnosis not present

## 2016-04-23 DIAGNOSIS — Z5181 Encounter for therapeutic drug level monitoring: Secondary | ICD-10-CM

## 2016-04-23 LAB — POCT INR: INR: 1.9

## 2016-04-23 NOTE — Patient Instructions (Addendum)
Medication Instructions:  Your physician recommends that you continue on your current medications as directed. Please refer to the Current Medication list given to you today.   Labwork: None ordered   Testing/Procedures:  Pacemaker generator change on 05/20/16  Please arrive at [facility] [place] to have lite breakfast but nothing to eat or drink after 9am Okay to take medications the morning of the procedure with your breakfast Will need someone to drive you home the procedure Hold Coumadin for 2 days prior--last dose 05/17/16   Follow-Up:  Your physician recommends that you schedule a follow-up appointment in: 10-14 days from 05/20/16 in device clinic for wound check and 3 months from 05/20/16 with Dr Rayann Heman   Any Other Special Instructions Will Be Listed Below (If Applicable).     If you need a refill on your cardiac medications before your next appointment, please call your pharmacy.

## 2016-04-24 LAB — CUP PACEART INCLINIC DEVICE CHECK
Battery Voltage: 2.58 V
Brady Statistic RA Percent Paced: 2.7 %
Brady Statistic RV Percent Paced: 97.3 %
Date Time Interrogation Session: 20171101184356
Implantable Lead Implant Date: 20070206
Implantable Lead Location: 753860
Implantable Lead Model: 5076
Implantable Pulse Generator Implant Date: 20070206
Lead Channel Impedance Value: 717 Ohm
Lead Channel Setting Pacing Pulse Width: 0.6 ms
Lead Channel Setting Sensing Sensitivity: 2.8 mV
MDC IDC MSMT BATTERY IMPEDANCE: 9666 Ohm
MDC IDC MSMT LEADCHNL RA IMPEDANCE VALUE: 0 Ohm
MDC IDC MSMT LEADCHNL RV PACING THRESHOLD AMPLITUDE: 1 V
MDC IDC MSMT LEADCHNL RV PACING THRESHOLD PULSEWIDTH: 0.6 ms
MDC IDC SET LEADCHNL RV PACING AMPLITUDE: 2.5 V

## 2016-04-24 NOTE — Progress Notes (Signed)
Pacemaker check in clinic. Normal device function, ERI since 04/06/16. Thresholds, sensing, impedances consistent with previous measurements. Device programmed to maximize longevity. 11 high ventricular rates noted, no EGMs, longest 3 sec. Device programmed at appropriate safety margins. Histogram distribution appropriate for patient activity level. Patient education completed. Plan for generator change on 05/20/16.

## 2016-05-08 ENCOUNTER — Encounter: Payer: Self-pay | Admitting: Neurology

## 2016-05-08 ENCOUNTER — Ambulatory Visit (INDEPENDENT_AMBULATORY_CARE_PROVIDER_SITE_OTHER): Payer: Medicare Other | Admitting: Neurology

## 2016-05-08 VITALS — BP 127/61 | HR 65 | Ht 70.0 in | Wt 197.8 lb

## 2016-05-08 DIAGNOSIS — R413 Other amnesia: Secondary | ICD-10-CM | POA: Diagnosis not present

## 2016-05-08 DIAGNOSIS — I6523 Occlusion and stenosis of bilateral carotid arteries: Secondary | ICD-10-CM | POA: Diagnosis not present

## 2016-05-08 MED ORDER — DONEPEZIL HCL 10 MG PO TABS
10.0000 mg | ORAL_TABLET | Freq: Every day | ORAL | 11 refills | Status: AC
Start: 2016-05-08 — End: ?

## 2016-05-08 NOTE — Progress Notes (Signed)
Reason for visit: Memory disorder  [name]. is an 81 y.o. male  History of present illness:  [name] is a 81 year old right-handed white male with a history of a progressive memory disorder. The patient is having some slow progression of his memory, he remains on Aricept. He has had some weight loss over the last one year of 18 pounds. The patient is fairly inactive, he watches TV most of the day. He is somewhat irritable, he will not bathe on a regular basis. The patient is sleeping well at night. He has had some issues with controlling his blood sugars. The patient returns to the office for an evaluation.  Past Medical History:  Diagnosis Date  . Aortal stenosis 2006   biophrostetic  . Arthritis   . Atrial fibrillation ([place])    onset after CABG--- coumadin management at Coumadin clinic  . CHF (congestive heart failure) ([place])    abter CABG  . Complete heart block ([place])   . Coronary artery disease   . DM type 2 (diabetes mellitus, type 2) ([place])    used to see [name], now f/u by [name]  . Erectile dysfunction   . Gangrene ([place])   . History of nephrolithiasis   . HTN (hypertension)   . Hyperlipidemia   . Memory loss   . Obesity   . OSA (obstructive sleep apnea)    not using CPAP  . Pacemaker 2/07   VVI  . PVD (peripheral vascular disease) ([name])    CT angio showed- vascular insuff, intestine and RAS bilaterally, andgiogram 02/2009 severe PVD medical management  . S/P aortic valve replacement   . Sick sinus syndrome ([name])    [name]  . Skin cancer    surgery (nose) 09-2010    Past Surgical History:  Procedure Laterality Date  . ABDOMINAL AORTAGRAM N/A 11/10/2012   Procedure: ABDOMINAL Maxcine Ham;  Surgeon: Seth Mitchell, MD;  Location: [facility] CATH LAB;  Service: Cardiovascular;  Laterality: N/A;  . AMPUTATION  01/2010   great toe  . ANGIOPLASTY / STENTING FEMORAL  02/10/10   Left femoral  . AORTIC VALVE REPLACEMENT     biophrostetic for Ao  Stenosis 2006  . AORTIC VALVE REPLACEMENT  01/17/2011   S/P redo median sternotomy, extracorporeal circulation, redo Aortic Valve Replacement using a 23-mm Edwards pericardial Magna-Ease valve, Seth Ulla Gallo  . CATARACT EXTRACTION, BILATERAL    . CORONARY ARTERY BYPASS GRAFT  2006  . EYE SURGERY    . INCISE AND DRAIN ABCESS Right May 2014   right heel   . LOWER EXTREMITY ANGIOGRAM Left 11/23/2012   Procedure: LOWER EXTREMITY ANGIOGRAM;  Surgeon: Seth Mitchell, MD;  Location: [facility] CATH LAB;  Service: Cardiovascular;  Laterality: Left;  . PACEMAKER PLACEMENT  07/2005   Medtronic Sigma SR implanted by Seth [name]  . TONSILLECTOMY      Family History  Problem Relation Age of Onset  . Heart attack Father 45  . Heart disease Father     Before age 5  . Hypertension Father   . Stroke Mother 44  . Cancer Sister     Cervical  . Heart attack Sister   . Heart disease Sister   . Diabetes Sister     2 sister w/ DM  . Heart attack Sister   . Heart attack Sister   . Heart disease Sister 69    AAA  and  Stomach Aneurysm  . AAA (abdominal aortic aneurysm)  Sister   . Heart disease Sister     Before age 17  . Heart attack Sister 15  . Colon cancer Neg Hx   . Prostate cancer Neg Hx     Social history:  reports that he quit smoking about 27 years ago. His smoking use included Cigarettes. He has quit using smokeless tobacco. He reports that he does not drink alcohol or use drugs.    Allergies  Allergen Reactions  . Potassium-Containing Compounds Anaphylaxis    IV--loss of conciousness  . Metoprolol Succinate Other (See Comments)    Extremely tired    Medications:  Prior to Admission medications   Medication Sig Start Date End Date Taking? Authorizing Provider  aspirin EC 81 MG tablet Take 81 mg by mouth daily.   Yes Historical Provider, MD  donepezil (ARICEPT) 10 MG tablet Take 1 tablet (10 mg total) by mouth at bedtime. 05/10/15  Yes Seth Ducking, MD  ezetimibe-simvastatin  (VYTORIN) 10-40 MG per tablet Take 1 tablet by mouth at bedtime. 05/06/12  Yes Seth Dresser, MD  furosemide (LASIX) 80 MG tablet Take one tablet by mouth every morning and then one-half tablet by mouth every other day in the afternoon 03/28/16  Yes Seth Dresser, MD  insulin glargine (LANTUS) 100 UNIT/ML injection Inject 0.15 mLs (15 Units total) into the skin at bedtime. 06/07/14  Yes Seth A Regalado, MD  Insulin Glulisine (APIDRA [place]) Inject 35 Units into the skin 3 (three) times daily with meals.    Yes Historical Provider, MD  lisinopril (PRINIVIL,ZESTRIL) 10 MG tablet Take 1 tablet (10 mg total) by mouth daily. 07/25/15  Yes Seth Joylene Draft, PA-C  spironolactone (ALDACTONE) 25 MG tablet Take 1 tablet (25 mg total) by mouth daily. 08/10/15  Yes Seth Dresser, MD  warfarin (COUMADIN) 5 MG tablet Take as directed by coumadin clinic 09/05/15  Yes Seth Dresser, MD    ROS:  Out of a complete 14 system review of symptoms, the patient complains only of the following symptoms, and all other reviewed systems are negative.  Decreased activity, decreased appetite, fatigue, weight loss Hearing loss Shortness of breath Leg swelling Difficulty walking Daytime sleepiness Skin wounds Bruising easily Weakness Agitation, behavior problem, confusion, decreased concentration  Blood pressure 127/61, pulse 65, height 5\' 10"  (1.778 m), weight 197 lb 12 oz (89.7 kg).  Physical Exam  General: The patient is alert and cooperative at the time of the examination.  Skin: 2+ edema at the ankles is noted bilaterally.   Neurologic Exam  Mental status: The patient is alert and oriented x 2 at the time of the examination (not oriented to date). The Mini-Mental Status Examination done today shows a total score 21/30.   Cranial nerves: Facial symmetry is present. Speech is normal, no aphasia or dysarthria is noted. Extraocular movements are full. Visual fields are full.  Motor: The patient has good  strength in all 4 extremities.  Sensory examination: Soft touch sensation is symmetric on the face, arms, and legs.  Coordination: The patient has good finger-nose-finger and heel-to-shin bilaterally.  Gait and station: The patient has a normal gait. Tandem gait is unsteady. Romberg is negative. No drift is seen.  Reflexes: Deep tendon reflexes are symmetric, but are depressed.   Assessment/Plan:  1. Memory disturbance  The patient has had a very minimal change in memory over the last year. The patient continues to have some agitation at times, we have discussed starting a medication such as Zoloft  or Lexapro, the patient does not wish to do this. I discussed adding Namenda to his current medication regimen, they will call me if they desire to go on any of these medications. He will follow-up in 6 months. The weight loss issue will need to be followed over time, Aricept may be helping to promote this.  Jill Alexanders MD 05/08/2016 2:43 PM  [facility] [address] [place], [place] [address]  Phone [phone] Fax [phone]

## 2016-05-12 ENCOUNTER — Telehealth: Payer: Self-pay | Admitting: Internal Medicine

## 2016-05-12 NOTE — Telephone Encounter (Signed)
New message    Pt wife verbalized that she cant walk good and she wants to know if there is another date for the  CathLab. She is worried that her daughters wont be able to take them.

## 2016-05-13 ENCOUNTER — Other Ambulatory Visit (HOSPITAL_COMMUNITY): Payer: Medicare Other

## 2016-05-13 NOTE — Telephone Encounter (Signed)
Spoke with wife who request I call her fback around 1pm

## 2016-05-19 NOTE — Telephone Encounter (Signed)
Moved the generator change to 06/06/16 same time.  Wife is aware.  NPO after 9am

## 2016-05-19 NOTE — Telephone Encounter (Signed)
°  Follow Up    Pt requesting to speak to nurse regarding her upcoming cath procedure. States she will have to change appointment. Please call.

## 2016-05-20 ENCOUNTER — Ambulatory Visit (INDEPENDENT_AMBULATORY_CARE_PROVIDER_SITE_OTHER): Payer: Medicare Other | Admitting: Pharmacist

## 2016-05-20 ENCOUNTER — Encounter (HOSPITAL_BASED_OUTPATIENT_CLINIC_OR_DEPARTMENT_OTHER): Payer: Medicare Other | Attending: Surgery

## 2016-05-20 ENCOUNTER — Other Ambulatory Visit: Payer: Self-pay | Admitting: Surgery

## 2016-05-20 ENCOUNTER — Ambulatory Visit (HOSPITAL_COMMUNITY)
Admission: RE | Admit: 2016-05-20 | Discharge: 2016-05-20 | Disposition: A | Discharge status: Home / Self Care | Payer: Medicare Other | Source: Ambulatory Visit | Attending: Surgery | Admitting: Surgery

## 2016-05-20 DIAGNOSIS — F0390 Unspecified dementia without behavioral disturbance: Secondary | ICD-10-CM | POA: Insufficient documentation

## 2016-05-20 DIAGNOSIS — Z794 Long term (current) use of insulin: Secondary | ICD-10-CM | POA: Diagnosis not present

## 2016-05-20 DIAGNOSIS — I110 Hypertensive heart disease with heart failure: Secondary | ICD-10-CM | POA: Diagnosis not present

## 2016-05-20 DIAGNOSIS — I509 Heart failure, unspecified: Secondary | ICD-10-CM | POA: Insufficient documentation

## 2016-05-20 DIAGNOSIS — Z95820 Peripheral vascular angioplasty status with implants and grafts: Secondary | ICD-10-CM | POA: Insufficient documentation

## 2016-05-20 DIAGNOSIS — Z955 Presence of coronary angioplasty implant and graft: Secondary | ICD-10-CM | POA: Insufficient documentation

## 2016-05-20 DIAGNOSIS — Z79899 Other long term (current) drug therapy: Secondary | ICD-10-CM | POA: Diagnosis not present

## 2016-05-20 DIAGNOSIS — E1151 Type 2 diabetes mellitus with diabetic peripheral angiopathy without gangrene: Secondary | ICD-10-CM | POA: Insufficient documentation

## 2016-05-20 DIAGNOSIS — I739 Peripheral vascular disease, unspecified: Secondary | ICD-10-CM | POA: Diagnosis not present

## 2016-05-20 DIAGNOSIS — Z951 Presence of aortocoronary bypass graft: Secondary | ICD-10-CM | POA: Diagnosis not present

## 2016-05-20 DIAGNOSIS — Z950 Presence of cardiac pacemaker: Secondary | ICD-10-CM | POA: Insufficient documentation

## 2016-05-20 DIAGNOSIS — E785 Hyperlipidemia, unspecified: Secondary | ICD-10-CM | POA: Diagnosis not present

## 2016-05-20 DIAGNOSIS — Z7982 Long term (current) use of aspirin: Secondary | ICD-10-CM | POA: Insufficient documentation

## 2016-05-20 DIAGNOSIS — Z952 Presence of prosthetic heart valve: Secondary | ICD-10-CM | POA: Diagnosis not present

## 2016-05-20 DIAGNOSIS — L97522 Non-pressure chronic ulcer of other part of left foot with fat layer exposed: Secondary | ICD-10-CM | POA: Diagnosis not present

## 2016-05-20 DIAGNOSIS — E11621 Type 2 diabetes mellitus with foot ulcer: Secondary | ICD-10-CM | POA: Insufficient documentation

## 2016-05-20 DIAGNOSIS — M869 Osteomyelitis, unspecified: Secondary | ICD-10-CM | POA: Diagnosis not present

## 2016-05-20 DIAGNOSIS — Z87891 Personal history of nicotine dependence: Secondary | ICD-10-CM | POA: Insufficient documentation

## 2016-05-20 DIAGNOSIS — I70245 Atherosclerosis of native arteries of left leg with ulceration of other part of foot: Secondary | ICD-10-CM | POA: Insufficient documentation

## 2016-05-20 DIAGNOSIS — Z89412 Acquired absence of left great toe: Secondary | ICD-10-CM | POA: Diagnosis not present

## 2016-05-20 DIAGNOSIS — I4891 Unspecified atrial fibrillation: Secondary | ICD-10-CM | POA: Insufficient documentation

## 2016-05-20 DIAGNOSIS — Z5181 Encounter for therapeutic drug level monitoring: Secondary | ICD-10-CM

## 2016-05-20 DIAGNOSIS — I359 Nonrheumatic aortic valve disorder, unspecified: Secondary | ICD-10-CM | POA: Diagnosis not present

## 2016-05-20 DIAGNOSIS — Z7901 Long term (current) use of anticoagulants: Secondary | ICD-10-CM

## 2016-05-20 DIAGNOSIS — I6523 Occlusion and stenosis of bilateral carotid arteries: Secondary | ICD-10-CM

## 2016-05-20 DIAGNOSIS — L97822 Non-pressure chronic ulcer of other part of left lower leg with fat layer exposed: Secondary | ICD-10-CM | POA: Insufficient documentation

## 2016-05-20 DIAGNOSIS — S91102A Unspecified open wound of left great toe without damage to nail, initial encounter: Secondary | ICD-10-CM | POA: Diagnosis not present

## 2016-05-20 DIAGNOSIS — L97523 Non-pressure chronic ulcer of other part of left foot with necrosis of muscle: Secondary | ICD-10-CM | POA: Diagnosis not present

## 2016-05-20 DIAGNOSIS — R600 Localized edema: Secondary | ICD-10-CM | POA: Diagnosis not present

## 2016-05-20 LAB — POCT INR: INR: 4.2

## 2016-05-27 ENCOUNTER — Encounter (HOSPITAL_BASED_OUTPATIENT_CLINIC_OR_DEPARTMENT_OTHER): Payer: Medicare Other | Attending: Surgery

## 2016-05-27 ENCOUNTER — Other Ambulatory Visit: Payer: Self-pay | Admitting: Certified Registered Nurse Anesthetist

## 2016-05-27 DIAGNOSIS — B954 Other streptococcus as the cause of diseases classified elsewhere: Secondary | ICD-10-CM | POA: Diagnosis not present

## 2016-05-27 DIAGNOSIS — E1169 Type 2 diabetes mellitus with other specified complication: Secondary | ICD-10-CM | POA: Diagnosis not present

## 2016-05-27 DIAGNOSIS — M86372 Chronic multifocal osteomyelitis, left ankle and foot: Secondary | ICD-10-CM | POA: Insufficient documentation

## 2016-05-27 DIAGNOSIS — I70245 Atherosclerosis of native arteries of left leg with ulceration of other part of foot: Secondary | ICD-10-CM | POA: Insufficient documentation

## 2016-05-27 DIAGNOSIS — L97525 Non-pressure chronic ulcer of other part of left foot with muscle involvement without evidence of necrosis: Secondary | ICD-10-CM | POA: Insufficient documentation

## 2016-05-27 DIAGNOSIS — Z89412 Acquired absence of left great toe: Secondary | ICD-10-CM | POA: Diagnosis not present

## 2016-05-27 DIAGNOSIS — E11621 Type 2 diabetes mellitus with foot ulcer: Secondary | ICD-10-CM | POA: Diagnosis not present

## 2016-05-27 DIAGNOSIS — Z7901 Long term (current) use of anticoagulants: Secondary | ICD-10-CM | POA: Diagnosis not present

## 2016-05-27 DIAGNOSIS — I10 Essential (primary) hypertension: Secondary | ICD-10-CM | POA: Diagnosis not present

## 2016-05-27 DIAGNOSIS — E1151 Type 2 diabetes mellitus with diabetic peripheral angiopathy without gangrene: Secondary | ICD-10-CM | POA: Diagnosis not present

## 2016-05-27 DIAGNOSIS — I4891 Unspecified atrial fibrillation: Secondary | ICD-10-CM | POA: Diagnosis not present

## 2016-05-27 DIAGNOSIS — L97522 Non-pressure chronic ulcer of other part of left foot with fat layer exposed: Secondary | ICD-10-CM | POA: Diagnosis not present

## 2016-05-27 DIAGNOSIS — Z951 Presence of aortocoronary bypass graft: Secondary | ICD-10-CM | POA: Insufficient documentation

## 2016-05-27 DIAGNOSIS — F0390 Unspecified dementia without behavioral disturbance: Secondary | ICD-10-CM | POA: Diagnosis not present

## 2016-05-27 DIAGNOSIS — L97526 Non-pressure chronic ulcer of other part of left foot with bone involvement without evidence of necrosis: Secondary | ICD-10-CM | POA: Diagnosis not present

## 2016-05-27 DIAGNOSIS — Z955 Presence of coronary angioplasty implant and graft: Secondary | ICD-10-CM | POA: Diagnosis not present

## 2016-05-27 DIAGNOSIS — I2510 Atherosclerotic heart disease of native coronary artery without angina pectoris: Secondary | ICD-10-CM | POA: Insufficient documentation

## 2016-05-27 DIAGNOSIS — R600 Localized edema: Secondary | ICD-10-CM | POA: Diagnosis not present

## 2016-05-27 DIAGNOSIS — M7989 Other specified soft tissue disorders: Secondary | ICD-10-CM | POA: Diagnosis not present

## 2016-06-02 ENCOUNTER — Other Ambulatory Visit: Payer: Self-pay | Admitting: Surgery

## 2016-06-02 DIAGNOSIS — E13621 Other specified diabetes mellitus with foot ulcer: Secondary | ICD-10-CM

## 2016-06-02 DIAGNOSIS — M86372 Chronic multifocal osteomyelitis, left ankle and foot: Secondary | ICD-10-CM

## 2016-06-02 DIAGNOSIS — L97529 Non-pressure chronic ulcer of other part of left foot with unspecified severity: Principal | ICD-10-CM

## 2016-06-03 ENCOUNTER — Other Ambulatory Visit: Payer: Self-pay

## 2016-06-03 ENCOUNTER — Ambulatory Visit (INDEPENDENT_AMBULATORY_CARE_PROVIDER_SITE_OTHER): Payer: Medicare Other | Admitting: *Deleted

## 2016-06-03 ENCOUNTER — Ambulatory Visit (HOSPITAL_COMMUNITY): Payer: Medicare Other | Attending: Cardiology

## 2016-06-03 DIAGNOSIS — Z952 Presence of prosthetic heart valve: Secondary | ICD-10-CM | POA: Diagnosis not present

## 2016-06-03 DIAGNOSIS — I482 Chronic atrial fibrillation, unspecified: Secondary | ICD-10-CM

## 2016-06-03 DIAGNOSIS — I6523 Occlusion and stenosis of bilateral carotid arteries: Secondary | ICD-10-CM

## 2016-06-03 DIAGNOSIS — E119 Type 2 diabetes mellitus without complications: Secondary | ICD-10-CM | POA: Insufficient documentation

## 2016-06-03 DIAGNOSIS — I2510 Atherosclerotic heart disease of native coronary artery without angina pectoris: Secondary | ICD-10-CM | POA: Diagnosis not present

## 2016-06-03 DIAGNOSIS — I509 Heart failure, unspecified: Secondary | ICD-10-CM | POA: Insufficient documentation

## 2016-06-03 DIAGNOSIS — Z953 Presence of xenogenic heart valve: Secondary | ICD-10-CM | POA: Diagnosis not present

## 2016-06-03 DIAGNOSIS — I359 Nonrheumatic aortic valve disorder, unspecified: Secondary | ICD-10-CM

## 2016-06-03 DIAGNOSIS — E785 Hyperlipidemia, unspecified: Secondary | ICD-10-CM | POA: Insufficient documentation

## 2016-06-03 DIAGNOSIS — Z5181 Encounter for therapeutic drug level monitoring: Secondary | ICD-10-CM

## 2016-06-03 DIAGNOSIS — I081 Rheumatic disorders of both mitral and tricuspid valves: Secondary | ICD-10-CM | POA: Insufficient documentation

## 2016-06-03 DIAGNOSIS — I110 Hypertensive heart disease with heart failure: Secondary | ICD-10-CM | POA: Insufficient documentation

## 2016-06-03 DIAGNOSIS — Z7901 Long term (current) use of anticoagulants: Secondary | ICD-10-CM

## 2016-06-03 LAB — ECHOCARDIOGRAM COMPLETE
AO mean calculated velocity dopler: 164 cm/s
AOPV: 0.31 m/s
AV Area VTI: 1.08 cm2
AV Area mean vel: 1.09 cm2
AV Mean grad: 13 mmHg
AV VEL mean LVOT/AV: 0.32
AV area mean vel ind: 0.52 cm2/m2
AV vel: 1.15
AVA: 1.15 cm2
AVAREAVTIIND: 0.55 cm2/m2
AVPG: 25 mmHg
AVPKVEL: 250 cm/s
Ao-asc: 36 cm
Area-P 1/2: 2.68 cm2
CHL CUP AV PEAK INDEX: 0.52
CHL CUP AV VALUE AREA INDEX: 0.55
CHL CUP DOP CALC LVOT VTI: 17.3 cm
CHL CUP LVOT MV VTI: 1.22
CHL CUP MV DEC (S): 246
CHL CUP MV M VEL: 93.7
CHL CUP STROKE VOLUME: 62 mL
E decel time: 246 msec
E/e' ratio: 27.6
FS: 26 % — AB (ref 28–44)
IV/PV OW: 1.36
LA diam end sys: 47 mm
LA vol index: 47.1 mL/m2
LA vol: 98 mL
LADIAMINDEX: 2.26 cm/m2
LASIZE: 47 mm
LAVOLA4C: 91 mL
LV E/e'average: 27.6
LV PW d: 12.6 mm — AB (ref 0.6–1.1)
LV SIMPSON'S DISK: 58
LV dias vol index: 51 mL/m2
LV sys vol index: 21 mL/m2
LV sys vol: 44 mL (ref 21–61)
LVDIAVOL: 106 mL (ref 62–150)
LVEEMED: 27.6
LVELAT: 7.21 cm/s
LVOT MV VTI INDEX: 0.59 cm2/m2
LVOT SV: 60 mL
LVOT area: 3.46 cm2
LVOT diameter: 21 mm
LVOTPV: 78.2 cm/s
LVOTVTI: 0.33 cm
MV Peak grad: 16 mmHg
MV pk E vel: 199 m/s
MVANNULUSVTI: 49.2 cm
MVG: 5 mmHg
MVPKAVEL: 37.8 m/s
MVSPHT: 82 ms
RV LATERAL S' VELOCITY: 9.07 cm/s
RV sys press: 83 mmHg
Reg peak vel: 448 cm/s
TDI e' lateral: 7.21
TDI e' medial: 4.87
TRMAXVEL: 448 cm/s
VTI: 52 cm

## 2016-06-03 LAB — POCT INR: INR: 2.8

## 2016-06-04 DIAGNOSIS — L97525 Non-pressure chronic ulcer of other part of left foot with muscle involvement without evidence of necrosis: Secondary | ICD-10-CM | POA: Diagnosis not present

## 2016-06-04 DIAGNOSIS — I70245 Atherosclerosis of native arteries of left leg with ulceration of other part of foot: Secondary | ICD-10-CM | POA: Diagnosis not present

## 2016-06-04 DIAGNOSIS — L97529 Non-pressure chronic ulcer of other part of left foot with unspecified severity: Secondary | ICD-10-CM | POA: Diagnosis not present

## 2016-06-04 DIAGNOSIS — L97522 Non-pressure chronic ulcer of other part of left foot with fat layer exposed: Secondary | ICD-10-CM | POA: Diagnosis not present

## 2016-06-04 DIAGNOSIS — M86372 Chronic multifocal osteomyelitis, left ankle and foot: Secondary | ICD-10-CM | POA: Diagnosis not present

## 2016-06-04 DIAGNOSIS — B954 Other streptococcus as the cause of diseases classified elsewhere: Secondary | ICD-10-CM | POA: Diagnosis not present

## 2016-06-04 DIAGNOSIS — E11621 Type 2 diabetes mellitus with foot ulcer: Secondary | ICD-10-CM | POA: Diagnosis not present

## 2016-06-04 DIAGNOSIS — E1169 Type 2 diabetes mellitus with other specified complication: Secondary | ICD-10-CM | POA: Diagnosis not present

## 2016-06-05 ENCOUNTER — Telehealth: Payer: Self-pay | Admitting: Internal Medicine

## 2016-06-05 NOTE — Telephone Encounter (Signed)
Tried to reach patient and or wife got VM at both numbers.  Need to know more about what is going on.

## 2016-06-05 NOTE — Telephone Encounter (Signed)
Has a staff infection in his foot and needs to do an MRI of his foot to look at the infection and see if it has gone to the bone.

## 2016-06-05 NOTE — Telephone Encounter (Signed)
Dr Rayann Heman reviewed and will still do generator change tomorrow and replace with MRI compatible device.  Wife aware he will be able to have MRI in 2 weeks

## 2016-06-05 NOTE — Telephone Encounter (Signed)
New message    Pt wife verbalized that she needs to speak to rn pt has a staff infection and she said that they declined the pt the MRI   Pt wife said if she dont answer to leave vm

## 2016-06-06 ENCOUNTER — Other Ambulatory Visit: Payer: Self-pay

## 2016-06-06 ENCOUNTER — Encounter (HOSPITAL_COMMUNITY)
Admission: RE | Disposition: A | Discharge status: Home / Self Care | Payer: Self-pay | Source: Ambulatory Visit | Attending: Internal Medicine

## 2016-06-06 ENCOUNTER — Ambulatory Visit (HOSPITAL_COMMUNITY)
Admission: RE | Admit: 2016-06-06 | Discharge: 2016-06-06 | Disposition: A | Discharge status: Home / Self Care | Payer: Medicare Other | Source: Ambulatory Visit | Attending: Internal Medicine | Admitting: Internal Medicine

## 2016-06-06 DIAGNOSIS — Z85828 Personal history of other malignant neoplasm of skin: Secondary | ICD-10-CM | POA: Diagnosis not present

## 2016-06-06 DIAGNOSIS — Z89419 Acquired absence of unspecified great toe: Secondary | ICD-10-CM | POA: Insufficient documentation

## 2016-06-06 DIAGNOSIS — Z6830 Body mass index (BMI) 30.0-30.9, adult: Secondary | ICD-10-CM | POA: Diagnosis not present

## 2016-06-06 DIAGNOSIS — E1151 Type 2 diabetes mellitus with diabetic peripheral angiopathy without gangrene: Secondary | ICD-10-CM | POA: Diagnosis not present

## 2016-06-06 DIAGNOSIS — Z952 Presence of prosthetic heart valve: Secondary | ICD-10-CM | POA: Diagnosis not present

## 2016-06-06 DIAGNOSIS — Z87442 Personal history of urinary calculi: Secondary | ICD-10-CM | POA: Insufficient documentation

## 2016-06-06 DIAGNOSIS — I2510 Atherosclerotic heart disease of native coronary artery without angina pectoris: Secondary | ICD-10-CM | POA: Diagnosis not present

## 2016-06-06 DIAGNOSIS — I110 Hypertensive heart disease with heart failure: Secondary | ICD-10-CM | POA: Diagnosis not present

## 2016-06-06 DIAGNOSIS — E785 Hyperlipidemia, unspecified: Secondary | ICD-10-CM | POA: Insufficient documentation

## 2016-06-06 DIAGNOSIS — M86679 Other chronic osteomyelitis, unspecified ankle and foot: Secondary | ICD-10-CM | POA: Diagnosis not present

## 2016-06-06 DIAGNOSIS — G4733 Obstructive sleep apnea (adult) (pediatric): Secondary | ICD-10-CM | POA: Insufficient documentation

## 2016-06-06 DIAGNOSIS — I495 Sick sinus syndrome: Secondary | ICD-10-CM | POA: Insufficient documentation

## 2016-06-06 DIAGNOSIS — Z45010 Encounter for checking and testing of cardiac pacemaker pulse generator [battery]: Secondary | ICD-10-CM | POA: Insufficient documentation

## 2016-06-06 DIAGNOSIS — Z7901 Long term (current) use of anticoagulants: Secondary | ICD-10-CM | POA: Insufficient documentation

## 2016-06-06 DIAGNOSIS — I509 Heart failure, unspecified: Secondary | ICD-10-CM | POA: Insufficient documentation

## 2016-06-06 DIAGNOSIS — Z951 Presence of aortocoronary bypass graft: Secondary | ICD-10-CM | POA: Insufficient documentation

## 2016-06-06 DIAGNOSIS — E669 Obesity, unspecified: Secondary | ICD-10-CM | POA: Diagnosis not present

## 2016-06-06 DIAGNOSIS — I482 Chronic atrial fibrillation: Secondary | ICD-10-CM | POA: Insufficient documentation

## 2016-06-06 DIAGNOSIS — I442 Atrioventricular block, complete: Secondary | ICD-10-CM | POA: Insufficient documentation

## 2016-06-06 HISTORY — PX: EP IMPLANTABLE DEVICE: SHX172B

## 2016-06-06 LAB — BASIC METABOLIC PANEL
Anion gap: 8 (ref 5–15)
BUN: 29 mg/dL — ABNORMAL HIGH (ref 6–20)
CHLORIDE: 97 mmol/L — AB (ref 101–111)
CO2: 30 mmol/L (ref 22–32)
CREATININE: 1.13 mg/dL (ref 0.61–1.24)
Calcium: 10.3 mg/dL (ref 8.9–10.3)
GFR calc non Af Amer: 60 mL/min — ABNORMAL LOW (ref >60)
Glucose, Bld: 138 mg/dL — ABNORMAL HIGH (ref 65–99)
Potassium: 5.4 mmol/L — ABNORMAL HIGH (ref 3.5–5.1)
Sodium: 135 mmol/L (ref 135–145)

## 2016-06-06 LAB — CBC
HCT: 42.9 % (ref 39.0–52.0)
HEMOGLOBIN: 13.9 g/dL (ref 13.0–17.0)
MCH: 28.5 pg (ref 26.0–34.0)
MCHC: 32.4 g/dL (ref 30.0–36.0)
MCV: 88.1 fL (ref 78.0–100.0)
PLATELETS: 168 10*3/uL (ref 150–400)
RBC: 4.87 MIL/uL (ref 4.22–5.81)
RDW: 13.8 % (ref 11.5–15.5)
WBC: 9.1 10*3/uL (ref 4.0–10.5)

## 2016-06-06 LAB — GLUCOSE, CAPILLARY: Glucose-Capillary: 128 mg/dL — ABNORMAL HIGH (ref 65–99)

## 2016-06-06 LAB — SURGICAL PCR SCREEN
MRSA, PCR: NEGATIVE
STAPHYLOCOCCUS AUREUS: NEGATIVE

## 2016-06-06 LAB — PROTIME-INR
INR: 1.53
Prothrombin Time: 18.6 seconds — ABNORMAL HIGH (ref 11.4–15.2)

## 2016-06-06 SURGERY — PPM GENERATOR CHANGEOUT
Anesthesia: LOCAL

## 2016-06-06 MED ORDER — MUPIROCIN 2 % EX OINT
TOPICAL_OINTMENT | CUTANEOUS | Status: AC
  Administered 2016-06-06: 14:00:00
  Filled 2016-06-06: qty 22

## 2016-06-06 MED ORDER — CEFAZOLIN SODIUM-DEXTROSE 2-4 GM/100ML-% IV SOLN
INTRAVENOUS | Status: AC
  Filled 2016-06-06: qty 100

## 2016-06-06 MED ORDER — CEFAZOLIN SODIUM-DEXTROSE 2-4 GM/100ML-% IV SOLN
2.0000 g | INTRAVENOUS | Status: AC
  Administered 2016-06-06: 2 g via INTRAVENOUS

## 2016-06-06 MED ORDER — SODIUM CHLORIDE 0.9% FLUSH: 3.0000 mL | Freq: Two times a day (BID) | INTRAVENOUS | Status: DC

## 2016-06-06 MED ORDER — MUPIROCIN 2 % EX OINT
1.0000 "application " | TOPICAL_OINTMENT | Freq: Once | CUTANEOUS | Status: DC
  Filled 2016-06-06: qty 22

## 2016-06-06 MED ORDER — ONDANSETRON HCL 4 MG/2ML IJ SOLN: 4.0000 mg | Freq: Four times a day (QID) | INTRAMUSCULAR | Status: DC | PRN

## 2016-06-06 MED ORDER — GENTAMICIN SULFATE 40 MG/ML IJ SOLN
80.0000 mg | INTRAMUSCULAR | Status: AC
  Administered 2016-06-06: 80 mg

## 2016-06-06 MED ORDER — CHLORHEXIDINE GLUCONATE 4 % EX LIQD: 60.0000 mL | Freq: Once | CUTANEOUS | Status: DC

## 2016-06-06 MED ORDER — LIDOCAINE HCL (PF) 1 % IJ SOLN
INTRAMUSCULAR | Status: DC | PRN
  Administered 2016-06-06: 43 mL

## 2016-06-06 MED ORDER — SODIUM CHLORIDE 0.9% FLUSH: 3.0000 mL | INTRAVENOUS | Status: DC | PRN

## 2016-06-06 MED ORDER — SODIUM CHLORIDE 0.9 % IV SOLN
INTRAVENOUS | Status: DC
  Administered 2016-06-06: 14:00:00 via INTRAVENOUS

## 2016-06-06 MED ORDER — LIDOCAINE HCL (PF) 1 % IJ SOLN
INTRAMUSCULAR | Status: AC
  Filled 2016-06-06: qty 30

## 2016-06-06 MED ORDER — SODIUM CHLORIDE 0.9 % IV SOLN: 250.0000 mL | INTRAVENOUS | Status: DC | PRN

## 2016-06-06 MED ORDER — GENTAMICIN SULFATE 40 MG/ML IJ SOLN
INTRAMUSCULAR | Status: AC
  Filled 2016-06-06: qty 2

## 2016-06-06 MED ORDER — ACETAMINOPHEN 325 MG PO TABS: 325.0000 mg | ORAL_TABLET | ORAL | Status: DC | PRN

## 2016-06-06 MED ORDER — ACETAMINOPHEN 325 MG PO TABS
325.0000 mg | ORAL_TABLET | ORAL | Status: DC | PRN
  Filled 2016-06-06: qty 2

## 2016-06-06 SURGICAL SUPPLY — 4 items
CABLE SURGICAL S-101-97-12 (CABLE) ×1 IMPLANT
PAD DEFIB LIFELINK (PAD) ×1 IMPLANT
PPMADVISA SR MRI A3SR01 (Pacemaker) ×1 IMPLANT
TRAY PACEMAKER INSERTION (PACKS) ×1 IMPLANT

## 2016-06-06 NOTE — Discharge Instructions (Signed)
Keep incision clean and dry for 10 days. °No driving for 2 days.  °You can remove outer dressing tomorrow. °Leave steri-strips (little pieces of tape) on until seen in the office for wound check appointment. °Call the office ([phone]) for redness, drainage, swelling, or fever. ° °

## 2016-06-06 NOTE — H&P (Signed)
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 is a 81 y.o. male who presents today for pacemaker generator change.  He is elderly and quite frail.  He has chronic osteomyelitis of the foot for which he is followed in the wound clinic.  He has an ID appointment scheduled.  He has not had fevers, chills, or any symptoms of bacteremia.  Today, he denies symptoms of palpitations, chest pain, shortness of breath,  dizziness, presyncope, or syncope. + dependant edema  The patient is otherwise without complaint today.       Past Medical History:  Diagnosis Date  . Aortal stenosis 2006   biophrostetic  . Arthritis   . Atrial fibrillation ([place])    onset after CABG--- coumadin management at [facility]madin clinic  . CHF (congestive heart failure) ([place])    abter CABG  . Coronary artery disease   . DM type 2 (diabetes mellitus, type 2) ([place])    used to see Dr Meredith Pel, now f/u by Dr Larose Kells  . Erectile dysfunction   . Gangrene ([place])   . History of nephrolithiasis   . HTN (hypertension)   . Hyperlipidemia   . Memory loss   . Obesity   . OSA (obstructive sleep apnea)    not using CPAP  . Pacemaker 2/07   VVI  . PVD (peripheral vascular disease) ([name])    CT angio showed- vascular insuff, intestine and RAS bilaterally, andgiogram 02/2009 severe PVD medical management  . S/P aortic valve replacement   . Sick sinus syndrome ([name])    [name]  . Skin cancer    surgery (nose) 09-2010        Past Surgical History:  Procedure Laterality Date  . ABDOMINAL AORTAGRAM N/A 11/10/2012   Procedure: ABDOMINAL Maxcine Ham;  Surgeon: Serafina Mitchell, MD;  Location: [facility] CATH LAB;  Service: Cardiovascular;  Laterality: N/A;  . AMPUTATION  01/2010   great toe  . ANGIOPLASTY / STENTING FEMORAL  02/10/10   Left femoral  . AORTIC VALVE REPLACEMENT     biophrostetic for Ao Stenosis 2006  . AORTIC VALVE REPLACEMENT  01/17/2011   S/P redo median  sternotomy, extracorporeal circulation, redo Aortic Valve Replacement using a 23-mm Edwards pericardial Magna-Ease valve, Dr Ulla Gallo  . CATARACT EXTRACTION, BILATERAL    . CORONARY ARTERY BYPASS GRAFT  2006  . EYE SURGERY    . INCISE AND DRAIN ABCESS Right May 2014   right heel   . LOWER EXTREMITY ANGIOGRAM Left 11/23/2012   Procedure: LOWER EXTREMITY ANGIOGRAM;  Surgeon: Serafina Mitchell, MD;  Location: [facility] CATH LAB;  Service: Cardiovascular;  Laterality: Left;  . PACEMAKER PLACEMENT  07/2005   Medtronic Sigma SR implanted by Dr Olevia Perches  . TONSILLECTOMY      ROS- all systems are reviewed and negative except as per HPI above         Medicines reviewed  Physical Exam: Vitals:   06/06/16 1317  BP: 131/65  Pulse: 65  Temp: 97.6 F (36.4 C)   GEN- The patient is elderly and frail appearing, alert and oriented x 3 today.   Head- normocephalic, atraumatic Eyes-  Sclera clear, conjunctiva pink Ears- hearing intact Oropharynx- clear Lungs- Clear to ausculation bilaterally, normal work of breathing Chest- pacemaker pocket is well healed Heart- Regular rate and rhythm (paced) GI- soft, NT, ND, + BS Extremities- no clubbing, cyanosis, + dependant edema,  L foot with a wound on the dorsum   Assessment  and Plan:  1. Complete heart block  The patient has permanent afib and AV block.  His pacemaker is ERI. Risks, benefits, and alternatives to pacemaker pulse generator replacement were discussed in detail today.  The patient understands that risks include but are not limited to bleeding, infection, pneumothorax, perforation, tamponade, vascular damage, renal failure, MI, stroke, death, damage to his existing lead, and lead dislodgement and wishes to proceed.    Resume coumadin after the procedure The importance of close follow-up with ID is stressed today in order to prevent bacteremia going forward.  Thompson Grayer MD, [facility] 06/06/2016 3:11 PM

## 2016-06-09 ENCOUNTER — Encounter (HOSPITAL_COMMUNITY): Payer: Self-pay | Admitting: Internal Medicine

## 2016-06-10 DIAGNOSIS — L97525 Non-pressure chronic ulcer of other part of left foot with muscle involvement without evidence of necrosis: Secondary | ICD-10-CM | POA: Diagnosis not present

## 2016-06-10 DIAGNOSIS — E1169 Type 2 diabetes mellitus with other specified complication: Secondary | ICD-10-CM | POA: Diagnosis not present

## 2016-06-10 DIAGNOSIS — E11621 Type 2 diabetes mellitus with foot ulcer: Secondary | ICD-10-CM | POA: Diagnosis not present

## 2016-06-10 DIAGNOSIS — I70245 Atherosclerosis of native arteries of left leg with ulceration of other part of foot: Secondary | ICD-10-CM | POA: Diagnosis not present

## 2016-06-10 DIAGNOSIS — L97521 Non-pressure chronic ulcer of other part of left foot limited to breakdown of skin: Secondary | ICD-10-CM | POA: Diagnosis not present

## 2016-06-10 DIAGNOSIS — L97522 Non-pressure chronic ulcer of other part of left foot with fat layer exposed: Secondary | ICD-10-CM | POA: Diagnosis not present

## 2016-06-10 DIAGNOSIS — M8668 Other chronic osteomyelitis, other site: Secondary | ICD-10-CM | POA: Diagnosis not present

## 2016-06-10 DIAGNOSIS — M86372 Chronic multifocal osteomyelitis, left ankle and foot: Secondary | ICD-10-CM | POA: Diagnosis not present

## 2016-06-10 DIAGNOSIS — B954 Other streptococcus as the cause of diseases classified elsewhere: Secondary | ICD-10-CM | POA: Diagnosis not present

## 2016-06-11 ENCOUNTER — Encounter (HOSPITAL_COMMUNITY): Payer: Medicare Other

## 2016-06-12 ENCOUNTER — Ambulatory Visit (HOSPITAL_COMMUNITY)
Admission: RE | Admit: 2016-06-12 | Discharge: 2016-06-12 | Disposition: A | Discharge status: Home / Self Care | Payer: Medicare Other | Source: Ambulatory Visit | Attending: Vascular Surgery | Admitting: Vascular Surgery

## 2016-06-12 DIAGNOSIS — Z48812 Encounter for surgical aftercare following surgery on the circulatory system: Secondary | ICD-10-CM | POA: Insufficient documentation

## 2016-06-12 DIAGNOSIS — Z959 Presence of cardiac and vascular implant and graft, unspecified: Secondary | ICD-10-CM

## 2016-06-12 DIAGNOSIS — Z89412 Acquired absence of left great toe: Secondary | ICD-10-CM | POA: Diagnosis not present

## 2016-06-12 DIAGNOSIS — I10 Essential (primary) hypertension: Secondary | ICD-10-CM | POA: Insufficient documentation

## 2016-06-12 DIAGNOSIS — I739 Peripheral vascular disease, unspecified: Secondary | ICD-10-CM | POA: Diagnosis not present

## 2016-06-12 DIAGNOSIS — E1151 Type 2 diabetes mellitus with diabetic peripheral angiopathy without gangrene: Secondary | ICD-10-CM | POA: Insufficient documentation

## 2016-06-13 ENCOUNTER — Ambulatory Visit (HOSPITAL_COMMUNITY)
Admission: RE | Admit: 2016-06-13 | Discharge: 2016-06-13 | Disposition: A | Discharge status: Home / Self Care | Payer: Medicare Other | Source: Ambulatory Visit | Attending: Cardiology | Admitting: Cardiology

## 2016-06-13 DIAGNOSIS — I6523 Occlusion and stenosis of bilateral carotid arteries: Secondary | ICD-10-CM

## 2016-06-13 DIAGNOSIS — E119 Type 2 diabetes mellitus without complications: Secondary | ICD-10-CM | POA: Insufficient documentation

## 2016-06-13 DIAGNOSIS — I2510 Atherosclerotic heart disease of native coronary artery without angina pectoris: Secondary | ICD-10-CM | POA: Diagnosis not present

## 2016-06-13 DIAGNOSIS — Z951 Presence of aortocoronary bypass graft: Secondary | ICD-10-CM | POA: Insufficient documentation

## 2016-06-13 DIAGNOSIS — I10 Essential (primary) hypertension: Secondary | ICD-10-CM | POA: Diagnosis not present

## 2016-06-13 DIAGNOSIS — I739 Peripheral vascular disease, unspecified: Secondary | ICD-10-CM | POA: Insufficient documentation

## 2016-06-13 DIAGNOSIS — E785 Hyperlipidemia, unspecified: Secondary | ICD-10-CM | POA: Diagnosis not present

## 2016-06-13 DIAGNOSIS — Z87891 Personal history of nicotine dependence: Secondary | ICD-10-CM | POA: Insufficient documentation

## 2016-06-16 ENCOUNTER — Encounter: Payer: Self-pay | Admitting: Physician Assistant

## 2016-06-17 ENCOUNTER — Telehealth: Payer: Self-pay | Admitting: *Deleted

## 2016-06-17 DIAGNOSIS — L97522 Non-pressure chronic ulcer of other part of left foot with fat layer exposed: Secondary | ICD-10-CM | POA: Diagnosis not present

## 2016-06-17 DIAGNOSIS — L97521 Non-pressure chronic ulcer of other part of left foot limited to breakdown of skin: Secondary | ICD-10-CM | POA: Diagnosis not present

## 2016-06-17 DIAGNOSIS — E11621 Type 2 diabetes mellitus with foot ulcer: Secondary | ICD-10-CM | POA: Diagnosis not present

## 2016-06-17 DIAGNOSIS — I70245 Atherosclerosis of native arteries of left leg with ulceration of other part of foot: Secondary | ICD-10-CM | POA: Diagnosis not present

## 2016-06-17 DIAGNOSIS — B954 Other streptococcus as the cause of diseases classified elsewhere: Secondary | ICD-10-CM | POA: Diagnosis not present

## 2016-06-17 DIAGNOSIS — E1169 Type 2 diabetes mellitus with other specified complication: Secondary | ICD-10-CM | POA: Diagnosis not present

## 2016-06-17 DIAGNOSIS — M8668 Other chronic osteomyelitis, other site: Secondary | ICD-10-CM | POA: Diagnosis not present

## 2016-06-17 DIAGNOSIS — L97525 Non-pressure chronic ulcer of other part of left foot with muscle involvement without evidence of necrosis: Secondary | ICD-10-CM | POA: Diagnosis not present

## 2016-06-17 DIAGNOSIS — M86372 Chronic multifocal osteomyelitis, left ankle and foot: Secondary | ICD-10-CM | POA: Diagnosis not present

## 2016-06-17 NOTE — Telephone Encounter (Signed)
DPR ok to s/w wife. Pt's wife notified of carotid US results and findings by phone. she asked for a copy to be maild to them. I will also fax a copy to PCP Dr. Larose Kells. Wife states pt has not seen PCP in a couple of years due to so many cardiology appts

## 2016-06-19 ENCOUNTER — Ambulatory Visit (INDEPENDENT_AMBULATORY_CARE_PROVIDER_SITE_OTHER): Payer: Medicare Other | Admitting: *Deleted

## 2016-06-19 DIAGNOSIS — Z7901 Long term (current) use of anticoagulants: Secondary | ICD-10-CM

## 2016-06-19 DIAGNOSIS — Z952 Presence of prosthetic heart valve: Secondary | ICD-10-CM | POA: Diagnosis not present

## 2016-06-19 DIAGNOSIS — Z950 Presence of cardiac pacemaker: Secondary | ICD-10-CM | POA: Diagnosis not present

## 2016-06-19 DIAGNOSIS — Z5181 Encounter for therapeutic drug level monitoring: Secondary | ICD-10-CM | POA: Diagnosis not present

## 2016-06-19 DIAGNOSIS — I359 Nonrheumatic aortic valve disorder, unspecified: Secondary | ICD-10-CM

## 2016-06-19 DIAGNOSIS — I442 Atrioventricular block, complete: Secondary | ICD-10-CM

## 2016-06-19 DIAGNOSIS — I6523 Occlusion and stenosis of bilateral carotid arteries: Secondary | ICD-10-CM

## 2016-06-19 LAB — CUP PACEART INCLINIC DEVICE CHECK
Implantable Lead Model: 5076
Lead Channel Impedance Value: 551 Ohm
Lead Channel Sensing Intrinsic Amplitude: 13.875 mV
Lead Channel Setting Pacing Amplitude: 2.5 V
Lead Channel Setting Pacing Pulse Width: 0.4 ms
MDC IDC LEAD IMPLANT DT: 20070206
MDC IDC LEAD LOCATION: 753860
MDC IDC MSMT BATTERY VOLTAGE: 3.11 V
MDC IDC MSMT LEADCHNL RV IMPEDANCE VALUE: 399 Ohm
MDC IDC PG IMPLANT DT: 20171215
MDC IDC SESS DTM: 20171228123618
MDC IDC SET LEADCHNL RV SENSING SENSITIVITY: 5.6 mV
MDC IDC STAT BRADY RV PERCENT PACED: 98.01 %

## 2016-06-19 LAB — POCT INR: INR: 3

## 2016-06-19 NOTE — Progress Notes (Signed)
Wound check appointment s/p PPM generator replacement 06/06/16. Steri-strips removed. Wound without redness or edema. Incision edges approximated, wound well healed. Normal device function. Thresholds, sensing, and impedances consistent with previous measurements. Histogram distribution appropriate for patient and level of activity. 2 high ventricular rates noted- probable AF RVR- on warfarin. Patient and wife educated about wound care and remote monitoring. ROV with JA 09/17/16.

## 2016-06-20 ENCOUNTER — Encounter (HOSPITAL_COMMUNITY): Payer: Medicare Other

## 2016-06-20 ENCOUNTER — Ambulatory Visit (HOSPITAL_COMMUNITY)
Admission: RE | Admit: 2016-06-20 | Discharge: 2016-06-20 | Disposition: A | Discharge status: Home / Self Care | Payer: Medicare Other | Source: Ambulatory Visit | Attending: Surgery | Admitting: Surgery

## 2016-06-20 ENCOUNTER — Emergency Department (HOSPITAL_COMMUNITY): Payer: Medicare Other

## 2016-06-20 ENCOUNTER — Other Ambulatory Visit: Payer: Self-pay

## 2016-06-20 ENCOUNTER — Telehealth (HOSPITAL_COMMUNITY): Payer: Self-pay

## 2016-06-20 ENCOUNTER — Encounter (HOSPITAL_COMMUNITY): Payer: Self-pay | Admitting: Neurology

## 2016-06-20 ENCOUNTER — Emergency Department (HOSPITAL_COMMUNITY)
Admission: EM | Admit: 2016-06-20 | Discharge: 2016-06-20 | Disposition: A | Discharge status: Home / Self Care | Payer: Medicare Other | Attending: Emergency Medicine | Admitting: Emergency Medicine

## 2016-06-20 DIAGNOSIS — I2510 Atherosclerotic heart disease of native coronary artery without angina pectoris: Secondary | ICD-10-CM | POA: Insufficient documentation

## 2016-06-20 DIAGNOSIS — E11621 Type 2 diabetes mellitus with foot ulcer: Secondary | ICD-10-CM | POA: Insufficient documentation

## 2016-06-20 DIAGNOSIS — Z951 Presence of aortocoronary bypass graft: Secondary | ICD-10-CM | POA: Diagnosis not present

## 2016-06-20 DIAGNOSIS — L97529 Non-pressure chronic ulcer of other part of left foot with unspecified severity: Secondary | ICD-10-CM | POA: Diagnosis not present

## 2016-06-20 DIAGNOSIS — R0602 Shortness of breath: Secondary | ICD-10-CM | POA: Insufficient documentation

## 2016-06-20 DIAGNOSIS — E13621 Other specified diabetes mellitus with foot ulcer: Secondary | ICD-10-CM

## 2016-06-20 DIAGNOSIS — Z7901 Long term (current) use of anticoagulants: Secondary | ICD-10-CM | POA: Insufficient documentation

## 2016-06-20 DIAGNOSIS — Z5321 Procedure and treatment not carried out due to patient leaving prior to being seen by health care provider: Secondary | ICD-10-CM | POA: Insufficient documentation

## 2016-06-20 DIAGNOSIS — E1169 Type 2 diabetes mellitus with other specified complication: Secondary | ICD-10-CM | POA: Diagnosis not present

## 2016-06-20 DIAGNOSIS — I509 Heart failure, unspecified: Secondary | ICD-10-CM | POA: Diagnosis not present

## 2016-06-20 DIAGNOSIS — Z85828 Personal history of other malignant neoplasm of skin: Secondary | ICD-10-CM | POA: Insufficient documentation

## 2016-06-20 DIAGNOSIS — Z950 Presence of cardiac pacemaker: Secondary | ICD-10-CM | POA: Diagnosis not present

## 2016-06-20 DIAGNOSIS — E119 Type 2 diabetes mellitus without complications: Secondary | ICD-10-CM | POA: Insufficient documentation

## 2016-06-20 DIAGNOSIS — M86672 Other chronic osteomyelitis, left ankle and foot: Secondary | ICD-10-CM | POA: Diagnosis not present

## 2016-06-20 DIAGNOSIS — Z79899 Other long term (current) drug therapy: Secondary | ICD-10-CM | POA: Insufficient documentation

## 2016-06-20 DIAGNOSIS — J90 Pleural effusion, not elsewhere classified: Secondary | ICD-10-CM | POA: Diagnosis not present

## 2016-06-20 DIAGNOSIS — Z89412 Acquired absence of left great toe: Secondary | ICD-10-CM | POA: Diagnosis not present

## 2016-06-20 DIAGNOSIS — M86372 Chronic multifocal osteomyelitis, left ankle and foot: Secondary | ICD-10-CM

## 2016-06-20 DIAGNOSIS — I110 Hypertensive heart disease with heart failure: Secondary | ICD-10-CM | POA: Insufficient documentation

## 2016-06-20 DIAGNOSIS — Z794 Long term (current) use of insulin: Secondary | ICD-10-CM | POA: Insufficient documentation

## 2016-06-20 DIAGNOSIS — Z87891 Personal history of nicotine dependence: Secondary | ICD-10-CM | POA: Diagnosis not present

## 2016-06-20 LAB — CBC WITH DIFFERENTIAL/PLATELET
BASOS PCT: 0 %
Basophils Absolute: 0 10*3/uL (ref 0.0–0.1)
EOS ABS: 0.1 10*3/uL (ref 0.0–0.7)
EOS PCT: 1 %
HCT: 42 % (ref 39.0–52.0)
HEMOGLOBIN: 13.5 g/dL (ref 13.0–17.0)
Lymphocytes Relative: 15 %
Lymphs Abs: 1.4 10*3/uL (ref 0.7–4.0)
MCH: 28.8 pg (ref 26.0–34.0)
MCHC: 32.1 g/dL (ref 30.0–36.0)
MCV: 89.7 fL (ref 78.0–100.0)
MONO ABS: 0.6 10*3/uL (ref 0.1–1.0)
MONOS PCT: 7 %
NEUTROS PCT: 77 %
Neutro Abs: 7.2 10*3/uL (ref 1.7–7.7)
PLATELETS: 175 10*3/uL (ref 150–400)
RBC: 4.68 MIL/uL (ref 4.22–5.81)
RDW: 14.3 % (ref 11.5–15.5)
WBC: 9.4 10*3/uL (ref 4.0–10.5)

## 2016-06-20 LAB — BASIC METABOLIC PANEL
Anion gap: 9 (ref 5–15)
BUN: 21 mg/dL — ABNORMAL HIGH (ref 6–20)
CALCIUM: 10.5 mg/dL — AB (ref 8.9–10.3)
CO2: 29 mmol/L (ref 22–32)
CREATININE: 1.15 mg/dL (ref 0.61–1.24)
Chloride: 97 mmol/L — ABNORMAL LOW (ref 101–111)
GFR, EST NON AFRICAN AMERICAN: 59 mL/min — AB (ref >60)
Glucose, Bld: 158 mg/dL — ABNORMAL HIGH (ref 65–99)
Potassium: 4.5 mmol/L (ref 3.5–5.1)
Sodium: 135 mmol/L (ref 135–145)

## 2016-06-20 LAB — I-STAT TROPONIN, ED: TROPONIN I, POC: 0.05 ng/mL (ref 0.00–0.08)

## 2016-06-20 MED ORDER — GADOBENATE DIMEGLUMINE 529 MG/ML IV SOLN
20.0000 mL | Freq: Once | INTRAVENOUS | Status: AC
  Administered 2016-06-20: 20 mL via INTRAVENOUS

## 2016-06-20 NOTE — ED Triage Notes (Signed)
Pt here after having an MRI today of his left foot for possible staph infection and had oxygen saturation of 80%. At current is 93% on RA, but wife reports SOB, but patient denies. Pt denies CP, reports "feeling great". Has hx of COPD and pacemaker.

## 2016-06-20 NOTE — ED Notes (Signed)
Pt left with family member. No acute distress noted.

## 2016-06-20 NOTE — Telephone Encounter (Signed)
Received call from [place] department stating patient's sats were in 80s and wife concerned to have patient seen by Dr. Aundra Dubin today. Advised CHF clinic is closed at this time and to have patient report to ED or urgent care or PCP for treatment. Wife hesitant to wait in ED as she reports not being well herself, and does not trust PCP visit would be effective. Advised these were the two quickest options for patient's treatment and safety. Wife agreeable to plan as stated above.  Renee Pain, RN

## 2016-06-24 ENCOUNTER — Encounter (HOSPITAL_BASED_OUTPATIENT_CLINIC_OR_DEPARTMENT_OTHER): Payer: Medicare Other | Attending: Surgery

## 2016-06-24 DIAGNOSIS — L97526 Non-pressure chronic ulcer of other part of left foot with bone involvement without evidence of necrosis: Secondary | ICD-10-CM | POA: Diagnosis not present

## 2016-06-24 DIAGNOSIS — M86372 Chronic multifocal osteomyelitis, left ankle and foot: Secondary | ICD-10-CM | POA: Diagnosis not present

## 2016-06-24 DIAGNOSIS — L97522 Non-pressure chronic ulcer of other part of left foot with fat layer exposed: Secondary | ICD-10-CM | POA: Diagnosis not present

## 2016-06-24 DIAGNOSIS — I2510 Atherosclerotic heart disease of native coronary artery without angina pectoris: Secondary | ICD-10-CM | POA: Insufficient documentation

## 2016-06-24 DIAGNOSIS — I10 Essential (primary) hypertension: Secondary | ICD-10-CM | POA: Insufficient documentation

## 2016-06-24 DIAGNOSIS — E1151 Type 2 diabetes mellitus with diabetic peripheral angiopathy without gangrene: Secondary | ICD-10-CM | POA: Insufficient documentation

## 2016-06-24 DIAGNOSIS — Z89432 Acquired absence of left foot: Secondary | ICD-10-CM | POA: Diagnosis not present

## 2016-06-24 DIAGNOSIS — E1169 Type 2 diabetes mellitus with other specified complication: Secondary | ICD-10-CM | POA: Diagnosis not present

## 2016-06-24 DIAGNOSIS — I70245 Atherosclerosis of native arteries of left leg with ulceration of other part of foot: Secondary | ICD-10-CM | POA: Diagnosis not present

## 2016-06-24 DIAGNOSIS — F0390 Unspecified dementia without behavioral disturbance: Secondary | ICD-10-CM | POA: Diagnosis not present

## 2016-06-24 DIAGNOSIS — L97521 Non-pressure chronic ulcer of other part of left foot limited to breakdown of skin: Secondary | ICD-10-CM | POA: Diagnosis not present

## 2016-06-24 DIAGNOSIS — E11621 Type 2 diabetes mellitus with foot ulcer: Secondary | ICD-10-CM | POA: Insufficient documentation

## 2016-06-26 ENCOUNTER — Ambulatory Visit (INDEPENDENT_AMBULATORY_CARE_PROVIDER_SITE_OTHER): Payer: Medicare Other | Admitting: Family

## 2016-06-26 ENCOUNTER — Encounter: Payer: Self-pay | Admitting: Family

## 2016-06-26 VITALS — BP 137/67 | HR 67 | Temp 96.9°F | Resp 18 | Ht 68.0 in | Wt 197.0 lb

## 2016-06-26 DIAGNOSIS — E1165 Type 2 diabetes mellitus with hyperglycemia: Secondary | ICD-10-CM

## 2016-06-26 DIAGNOSIS — M86172 Other acute osteomyelitis, left ankle and foot: Secondary | ICD-10-CM | POA: Diagnosis not present

## 2016-06-26 DIAGNOSIS — I739 Peripheral vascular disease, unspecified: Secondary | ICD-10-CM | POA: Diagnosis not present

## 2016-06-26 DIAGNOSIS — E1151 Type 2 diabetes mellitus with diabetic peripheral angiopathy without gangrene: Secondary | ICD-10-CM

## 2016-06-26 DIAGNOSIS — Z959 Presence of cardiac and vascular implant and graft, unspecified: Secondary | ICD-10-CM | POA: Diagnosis not present

## 2016-06-26 DIAGNOSIS — IMO0002 Reserved for concepts with insufficient information to code with codable children: Secondary | ICD-10-CM

## 2016-06-26 DIAGNOSIS — Z87891 Personal history of nicotine dependence: Secondary | ICD-10-CM

## 2016-06-26 NOTE — Progress Notes (Signed)
Vitals:   06/26/16 1508  BP: (!) 144/70  Pulse: 72  Resp: 18  Temp: (!) 96.9 F (36.1 C)  SpO2: 94%  Weight: 197 lb (89.4 kg)  Height: 5\' 8"  (1.727 m)

## 2016-06-26 NOTE — Progress Notes (Signed)
[facility] [place]   CC: Follow up peripheral artery occlusive disease  History of Present Illness [name]. is a 82 y.o. male patient of Dr. Trula Slade. The patient was seen early in 2015 with a right heel ulcer x2 as well as a breakdown of the toe amputation site at the left great toe.  He has previously undergone left superficial femoral artery stenting and subintimal recanalization of an occluded left anterior tibial artery with balloon angioplasty in 2011. This was done in the setting of an ischemic left toe ulcer which ultimately required left great toe amputation. In May of 2014, he underwent successful recanalization of an occluded right anterior tibial artery with subsequent balloon angioplasty using a 3 mm balloon. He was brought back several weeks later to address a high-grade stenosis vs. occlusion of his left superficial femoral artery stent. He underwent successful stenting of a nearly occluded left superficial femoral artery stent using a 7 x 1 20 self-expanding stent. This was done in June of 2014. The patient has gone on to receive wound care at the wound center and had completely healed all of his ulcers.   Pt was last evaluated in our office by me on 06-13-15. At that time wife stated pt has dementia, eats anything he wants including during the night, was unsteady on his feet. Thin black ring surrounding left second toenail, dry callus at right heel; both not present at previous visit. 05/21/15 left LE arterial duplex suggested a patent left superficial femoral artery stent with mildly elevated velocities in the proximal to mid segment with hyperplasia noted. Velocities suggested a <50% stenosis. Unable to determine ankle brachial indices due to non-compressible vessels. Right LE with monophasic waveforms, left LE with biphasic waveforms.  Multiple medical problems: uncontrolled DM, CHF, dementia, wife not able to cope with his issues, economic issues,  a young relative lives with them who has significant mental health issues, wife has not been able to take pt to pt's PCP in about 2 years. I referred pt to [facility] but [facility] declined pt.  I advised wife to have pt see his PCP ASAP re his right side lung rales at that time. I advised that pt return in 3 months with ABI's.   He was lost to follow up until today, returning with wife report of staph infection at the open wound on his right great toe amputation site and osteomyelitis found on recent MRI of left forefoot. He continues weekly visit to wound care center at [facility]. Wife states pt has been taking Keflex for a few weeks.   He denies history of stroke or TIA.  Pt Diabetic: Yes, wife states is not in control and states he gets up at night to eat, states his A1C in 2014 was 12.?. Last A1C result on file was 10.7 on 06-02-14 (review of records). Wife stated that he has not had his DM checked in a long time due to many other medical visits.  Pt smoker: former smoker, quit in 1990  Pt meds include: Statin :Yes ASA: no Other anticoagulants/antiplatelets: takes coumadin for history of 2 heart valve replacements    Past Medical History:  Diagnosis Date  . Aortal stenosis 2006   biophrostetic  . Arthritis   . Atrial fibrillation ([place])    onset after CABG--- coumadin management at Coumadin clinic  . Carotid artery disease ([place]) 09/27/2010   Carotid US 12/17: bilat ICA 40-59 >> 1 year FU  . CHF (congestive heart  failure) ([place])    abter CABG  . Complete heart block ([place])   . Coronary artery disease   . DM type 2 (diabetes mellitus, type 2) ([place])    used to see Dr Meredith Pel, now f/u by Dr Larose Kells  . Erectile dysfunction   . Gangrene ([place])   . History of nephrolithiasis   . HTN (hypertension)   . Hyperlipidemia   . Memory loss   . Obesity   . OSA (obstructive sleep apnea)    not using CPAP  . Pacemaker 2/07   VVI  . PVD (peripheral vascular disease) ([name])    CT angio showed- vascular insuff,  intestine and RAS bilaterally, andgiogram 02/2009 severe PVD medical management  . S/P aortic valve replacement   . Sick sinus syndrome ([name])    [name]  . Skin cancer    surgery (nose) 09-2010    Social History Social History  Substance Use Topics  . Smoking status: Former Smoker    Types: Cigarettes    Quit date: 06/23/1988  . Smokeless tobacco: Former Systems developer  . Alcohol use No    Family History Family History  Problem Relation Age of Onset  . Heart attack Father 39  . Heart disease Father     Before age 74  . Hypertension Father   . Stroke Mother 56  . Cancer Sister     Cervical  . Heart attack Sister   . Heart disease Sister   . Diabetes Sister     2 sister w/ DM  . Heart attack Sister   . Heart attack Sister   . Heart disease Sister 74    AAA  and  Stomach Aneurysm  . AAA (abdominal aortic aneurysm) Sister   . Heart disease Sister     Before age 71  . Heart attack Sister 39  . Colon cancer Neg Hx   . Prostate cancer Neg Hx     Past Surgical History:  Procedure Laterality Date  . ABDOMINAL AORTAGRAM N/A 11/10/2012   Procedure: ABDOMINAL Maxcine Ham;  Surgeon: Serafina Mitchell, MD;  Location: [facility] CATH LAB;  Service: Cardiovascular;  Laterality: N/A;  . AMPUTATION  01/2010   great toe  . ANGIOPLASTY / STENTING FEMORAL  02/10/10   Left femoral  . AORTIC VALVE REPLACEMENT     biophrostetic for Ao Stenosis 2006  . AORTIC VALVE REPLACEMENT  01/17/2011   S/P redo median sternotomy, extracorporeal circulation, redo Aortic Valve Replacement using a 23-mm Edwards pericardial Magna-Ease valve, Dr Ulla Gallo  . CATARACT EXTRACTION, BILATERAL    . CORONARY ARTERY BYPASS GRAFT  2006  . EP IMPLANTABLE DEVICE N/A 06/06/2016   Procedure: PPM Generator Changeout;  Surgeon: Thompson Grayer, MD;  Location: [facility] CV LAB;  Service: Cardiovascular;  Laterality: N/A;  . EYE SURGERY    . INCISE AND DRAIN ABCESS Right May 2014   right heel   . LOWER EXTREMITY  ANGIOGRAM Left 11/23/2012   Procedure: LOWER EXTREMITY ANGIOGRAM;  Surgeon: Serafina Mitchell, MD;  Location: [facility] CATH LAB;  Service: Cardiovascular;  Laterality: Left;  . PACEMAKER PLACEMENT  07/2005   Medtronic Sigma SR implanted by Dr Olevia Perches  . TONSILLECTOMY      Allergies  Allergen Reactions  . Potassium-Containing Compounds Anaphylaxis    IV--loss of conciousness  . Metoprolol Succinate Other (See Comments)    Extremely tired    Current Outpatient Prescriptions  Medication Sig Dispense Refill  . cephALEXin (KEFLEX) 500 MG capsule take 1 capsule  by mouth every 8 hours  0  . donepezil (ARICEPT) 10 MG tablet Take 1 tablet (10 mg total) by mouth at bedtime. 30 tablet 11  . ezetimibe-simvastatin (VYTORIN) 10-40 MG per tablet Take 1 tablet by mouth at bedtime. 30 tablet 3  . furosemide (LASIX) 80 MG tablet Take one tablet by mouth every morning and then one-half tablet by mouth every other day in the afternoon 45 tablet 11  . insulin glargine (LANTUS) 100 UNIT/ML injection Inject 0.15 mLs (15 Units total) into the skin at bedtime. 10 mL 11  . Insulin Glulisine (APIDRA [place]) Inject 35 Units into the skin 3 (three) times daily with meals.     Marland Kitchen lisinopril (PRINIVIL,ZESTRIL) 10 MG tablet Take 1 tablet (10 mg total) by mouth daily. 90 tablet 3  . spironolactone (ALDACTONE) 25 MG tablet Take 1 tablet (25 mg total) by mouth daily. 30 tablet 11  . warfarin (COUMADIN) 5 MG tablet Take as directed by coumadin clinic 45 tablet 2   No current facility-administered medications for this visit.     ROS: See HPI for pertinent positives and negatives.   Physical Examination  Vitals:   06/26/16 1508 06/26/16 1511  BP: (!) 144/70 137/67  Pulse: 72 67  Resp: 18   Temp: (!) 96.9 F (36.1 C)   SpO2: 94%   Weight: 197 lb (89.4 kg)   Height: 5\' 8"  (1.727 m)    Body mass index is 29.95 kg/m.  General:A&O x 2, WDWN, elderly debilitated male. Gait: normal Eyes: PERRLA. Pulmonary: Respirations are  non labored at rest, + rales in left base, without wheezes or rhonchi. Cardiac: regular rhythm, +murmur. Subcutaneous pacemaker left upper chest.     Carotid Bruits Left Right   Transmitted cardiac murmur Transmitted cardiac murmur  Aorta is not palpable. Radial pulses: are 1+ palpable and =.   VASCULAR EXAM: Extremities with ischemic changes : mild rubor in plantar surfaces of right foot. without Gangrene; with open wounds: Left great toe is surgically absent, medial to toe amputation site is an open punctate wound that measures about 0.5 cm x 0.5 cm, about 2-3 mm deep which is surrounded by erythematous tissue, is foul smelling, no drainage at this time.      LE Pulses LEFT RIGHT   FEMORAL  palpable not palpable    POPLITEAL not palpable  not palpable   POSTERIOR TIBIAL not palpable  not palpable    DORSALIS PEDIS  ANTERIOR TIBIAL not palpable  not palpable    Abdomen: soft, NT, no palpated masses. Skin: no rashes, See Extremities. Musculoskeletal: Generalized deconditioning muscle wasting or atrophy. See Extremities. Neurologic: A&O X 2; Appropriate Affect ; SENSATION: normal; MOTOR FUNCTION: moving all extremities equally, motor strength 4/5 throughout. Speech is fluent/normal. CN 2-12 intact.    MRI OF LEFT FOREFOOT WITH AND W/O CONTRAST ON 06/20/2016: IMPRESSION: 1. Soft tissue ulceration medial to the first metatarsal head status post remote resection of the great toe. There is abnormal underlying signal and enhancement within the distal half of the first metatarsal consistent with osteomyelitis. This may involve the tibial sesamoid as well. 2. The additional metatarsals, toes and visualized tarsal bones demonstrate no  significant findings. 3. Nonspecific soft tissue findings with T2 hyperintensity and enhancement in the forefoot musculature, probably related to diabetic myopathy. No focal soft tissue abscess identified.    Non-Invasive Vascular Imaging: DATE: 06-12-16 ABI:  RIGHT: PT: 0.00, DP: [place], DP waveforms: appear to be monophasic; TBI: 0.22   LEFT:  PT: 0.35, DP: 1.35, Waveforms: appears to be monophasic  ASSESSMENT: Eulon Lanman. is a 82 y.o. male who is s/p left superficial femoral artery stent on 02/10/10 & 11/23/12, and amputation left great toe in 2011. He was lost to follow up since his last visit on 06-13-15. He returns today with osteomyelitis suggested by left foot MRI on 06-20-16, foul smelling left foot medial metatarsal area open punctate wound.  Wife states pt has dementia, eats anything he wants including during the night, is unsteady on his feet.  05/21/15 left LE arterial duplex suggest a patent left superficial femoral artery stent with mildly elevated velocities in the proximal to mid segment with hyperplasia noted. Velocities suggest a <50% stenosis. Unable to determine ankle brachial indices due to non-compressible vessels. Right LE with monophasic waveforms, left LE with biphasic waveforms.   Multiple medical problems: uncontrolled DM, CHF, dementia, wife not able to cope with his issues, economic issues, a young relative lives with them who has significant mental health issues, wife has not been able to take pt to pt's PCP in about 2 years; referred to [facility] at pt's last visit on 06-13-15, but [facility] declined pt.   MRI of left foot results reviewed with Dr. Oneida Alar: distal half of the first metatarsal consistent with osteomyelitis. This may involve the tibial sesamoid as well. No focal soft tissue abscess identified.  Pt attends [facility] wound care clinic weekly according to wife, and wife changes dressing daily with silver alginate.  Wife also states that pt is scheduled soon to  see ID in consult re the staph infection in his left foot. Wife states he has been taking po Keflex for a few weeks.   PLAN:  Based on the patient's vascular studies and examination, and after discussing with Dr. Oneida Alar, pt will return to clinic for left LE arterial duplex as soon as possible and see Dr. Trula Slade afterward.   I discussed in depth with the patient the nature of atherosclerosis, and emphasized the importance of maximal medical management including strict control of blood pressure, blood glucose, and lipid levels, obtaining regular exercise, and cessation of smoking.  The patient is aware that without maximal medical management the underlying atherosclerotic disease process will progress, limiting the benefit of any interventions.  The patient was given information about PAD including signs, symptoms, treatment, what symptoms should prompt the patient to seek immediate medical care, and risk reduction measures to take.  Seth Chambers, RN, MSN, FNP-C [facility] Phone: [phone]  Clinic MD: Oneida Alar  06/26/16 3:29 PM

## 2016-06-26 NOTE — Patient Instructions (Signed)

## 2016-06-27 ENCOUNTER — Other Ambulatory Visit: Payer: Self-pay

## 2016-06-27 DIAGNOSIS — I739 Peripheral vascular disease, unspecified: Secondary | ICD-10-CM

## 2016-06-27 NOTE — Addendum Note (Signed)
Addended by: Lianne Cure A on: 06/27/2016 01:47 PM   Modules accepted: Orders

## 2016-07-01 DIAGNOSIS — E1169 Type 2 diabetes mellitus with other specified complication: Secondary | ICD-10-CM | POA: Diagnosis not present

## 2016-07-01 DIAGNOSIS — I2510 Atherosclerotic heart disease of native coronary artery without angina pectoris: Secondary | ICD-10-CM | POA: Diagnosis not present

## 2016-07-01 DIAGNOSIS — I70245 Atherosclerosis of native arteries of left leg with ulceration of other part of foot: Secondary | ICD-10-CM | POA: Diagnosis not present

## 2016-07-01 DIAGNOSIS — L97522 Non-pressure chronic ulcer of other part of left foot with fat layer exposed: Secondary | ICD-10-CM | POA: Diagnosis not present

## 2016-07-01 DIAGNOSIS — L97526 Non-pressure chronic ulcer of other part of left foot with bone involvement without evidence of necrosis: Secondary | ICD-10-CM | POA: Diagnosis not present

## 2016-07-01 DIAGNOSIS — E11621 Type 2 diabetes mellitus with foot ulcer: Secondary | ICD-10-CM | POA: Diagnosis not present

## 2016-07-01 DIAGNOSIS — M86372 Chronic multifocal osteomyelitis, left ankle and foot: Secondary | ICD-10-CM | POA: Diagnosis not present

## 2016-07-02 ENCOUNTER — Telehealth: Payer: Self-pay | Admitting: Internal Medicine

## 2016-07-02 NOTE — Telephone Encounter (Signed)
Mrs.Hillyard is requesting to speak with you as soon as she can. Thanks.

## 2016-07-02 NOTE — Telephone Encounter (Signed)
Returned to call to patient's wife and she says he is sleeping a lot and feels he is not right.  He has an infection in his leg and has lots of upcoming appointments for that but says he is having a lot of SOB.  He is still taking his Furosemide and she does not know if his weights are stable. She is going to start having him weigh himself daily to see if it is stable or up and how it trends.

## 2016-07-03 ENCOUNTER — Encounter: Payer: Self-pay | Admitting: Internal Medicine

## 2016-07-03 ENCOUNTER — Inpatient Hospital Stay (HOSPITAL_COMMUNITY)
Admission: EM | Admit: 2016-07-03 | Discharge: 2016-07-21 | DRG: 270 | Disposition: A | Discharge status: Skilled Nursing Facility | Payer: Medicare Other | Attending: Family Medicine | Admitting: Family Medicine

## 2016-07-03 ENCOUNTER — Encounter (HOSPITAL_COMMUNITY): Payer: Self-pay

## 2016-07-03 ENCOUNTER — Ambulatory Visit (INDEPENDENT_AMBULATORY_CARE_PROVIDER_SITE_OTHER): Payer: Medicare Other | Admitting: Internal Medicine

## 2016-07-03 ENCOUNTER — Emergency Department (HOSPITAL_COMMUNITY): Payer: Medicare Other

## 2016-07-03 VITALS — BP 113/72 | HR 90 | Temp 97.6°F | Ht 68.0 in | Wt 197.0 lb

## 2016-07-03 DIAGNOSIS — M1990 Unspecified osteoarthritis, unspecified site: Secondary | ICD-10-CM | POA: Diagnosis present

## 2016-07-03 DIAGNOSIS — Z6827 Body mass index (BMI) 27.0-27.9, adult: Secondary | ICD-10-CM | POA: Diagnosis not present

## 2016-07-03 DIAGNOSIS — Z9889 Other specified postprocedural states: Secondary | ICD-10-CM

## 2016-07-03 DIAGNOSIS — I5033 Acute on chronic diastolic (congestive) heart failure: Secondary | ICD-10-CM | POA: Diagnosis present

## 2016-07-03 DIAGNOSIS — R0902 Hypoxemia: Secondary | ICD-10-CM

## 2016-07-03 DIAGNOSIS — J9811 Atelectasis: Secondary | ICD-10-CM | POA: Diagnosis present

## 2016-07-03 DIAGNOSIS — E1151 Type 2 diabetes mellitus with diabetic peripheral angiopathy without gangrene: Secondary | ICD-10-CM | POA: Diagnosis present

## 2016-07-03 DIAGNOSIS — E785 Hyperlipidemia, unspecified: Secondary | ICD-10-CM | POA: Diagnosis not present

## 2016-07-03 DIAGNOSIS — E11621 Type 2 diabetes mellitus with foot ulcer: Secondary | ICD-10-CM | POA: Diagnosis present

## 2016-07-03 DIAGNOSIS — T8131XA Disruption of external operation (surgical) wound, not elsewhere classified, initial encounter: Secondary | ICD-10-CM | POA: Diagnosis not present

## 2016-07-03 DIAGNOSIS — I509 Heart failure, unspecified: Secondary | ICD-10-CM

## 2016-07-03 DIAGNOSIS — Z85828 Personal history of other malignant neoplasm of skin: Secondary | ICD-10-CM

## 2016-07-03 DIAGNOSIS — Z953 Presence of xenogenic heart valve: Secondary | ICD-10-CM

## 2016-07-03 DIAGNOSIS — I482 Chronic atrial fibrillation, unspecified: Secondary | ICD-10-CM | POA: Diagnosis present

## 2016-07-03 DIAGNOSIS — Z952 Presence of prosthetic heart valve: Secondary | ICD-10-CM | POA: Diagnosis not present

## 2016-07-03 DIAGNOSIS — L97428 Non-pressure chronic ulcer of left heel and midfoot with other specified severity: Secondary | ICD-10-CM | POA: Diagnosis not present

## 2016-07-03 DIAGNOSIS — Z7901 Long term (current) use of anticoagulants: Secondary | ICD-10-CM

## 2016-07-03 DIAGNOSIS — Z89412 Acquired absence of left great toe: Secondary | ICD-10-CM

## 2016-07-03 DIAGNOSIS — E1169 Type 2 diabetes mellitus with other specified complication: Secondary | ICD-10-CM

## 2016-07-03 DIAGNOSIS — M869 Osteomyelitis, unspecified: Secondary | ICD-10-CM

## 2016-07-03 DIAGNOSIS — Y838 Other surgical procedures as the cause of abnormal reaction of the patient, or of later complication, without mention of misadventure at the time of the procedure: Secondary | ICD-10-CM | POA: Diagnosis not present

## 2016-07-03 DIAGNOSIS — I5031 Acute diastolic (congestive) heart failure: Secondary | ICD-10-CM | POA: Diagnosis not present

## 2016-07-03 DIAGNOSIS — Z79899 Other long term (current) drug therapy: Secondary | ICD-10-CM

## 2016-07-03 DIAGNOSIS — IMO0002 Reserved for concepts with insufficient information to code with codable children: Secondary | ICD-10-CM

## 2016-07-03 DIAGNOSIS — J948 Other specified pleural conditions: Secondary | ICD-10-CM | POA: Diagnosis not present

## 2016-07-03 DIAGNOSIS — I739 Peripheral vascular disease, unspecified: Secondary | ICD-10-CM | POA: Diagnosis not present

## 2016-07-03 DIAGNOSIS — B372 Candidiasis of skin and nail: Secondary | ICD-10-CM | POA: Diagnosis not present

## 2016-07-03 DIAGNOSIS — I442 Atrioventricular block, complete: Secondary | ICD-10-CM | POA: Diagnosis present

## 2016-07-03 DIAGNOSIS — E440 Moderate protein-calorie malnutrition: Secondary | ICD-10-CM | POA: Diagnosis present

## 2016-07-03 DIAGNOSIS — G4733 Obstructive sleep apnea (adult) (pediatric): Secondary | ICD-10-CM | POA: Diagnosis present

## 2016-07-03 DIAGNOSIS — F0390 Unspecified dementia without behavioral disturbance: Secondary | ICD-10-CM | POA: Diagnosis present

## 2016-07-03 DIAGNOSIS — Z794 Long term (current) use of insulin: Secondary | ICD-10-CM

## 2016-07-03 DIAGNOSIS — I350 Nonrheumatic aortic (valve) stenosis: Secondary | ICD-10-CM | POA: Diagnosis not present

## 2016-07-03 DIAGNOSIS — M6281 Muscle weakness (generalized): Secondary | ICD-10-CM | POA: Diagnosis not present

## 2016-07-03 DIAGNOSIS — D62 Acute posthemorrhagic anemia: Secondary | ICD-10-CM | POA: Diagnosis not present

## 2016-07-03 DIAGNOSIS — I2510 Atherosclerotic heart disease of native coronary artery without angina pectoris: Secondary | ICD-10-CM | POA: Diagnosis present

## 2016-07-03 DIAGNOSIS — M8660 Other chronic osteomyelitis, unspecified site: Secondary | ICD-10-CM | POA: Diagnosis not present

## 2016-07-03 DIAGNOSIS — M86179 Other acute osteomyelitis, unspecified ankle and foot: Secondary | ICD-10-CM | POA: Diagnosis not present

## 2016-07-03 DIAGNOSIS — L97509 Non-pressure chronic ulcer of other part of unspecified foot with unspecified severity: Secondary | ICD-10-CM

## 2016-07-03 DIAGNOSIS — R0602 Shortness of breath: Secondary | ICD-10-CM | POA: Diagnosis not present

## 2016-07-03 DIAGNOSIS — Z87891 Personal history of nicotine dependence: Secondary | ICD-10-CM | POA: Diagnosis not present

## 2016-07-03 DIAGNOSIS — L97512 Non-pressure chronic ulcer of other part of right foot with fat layer exposed: Secondary | ICD-10-CM | POA: Diagnosis not present

## 2016-07-03 DIAGNOSIS — L97529 Non-pressure chronic ulcer of other part of left foot with unspecified severity: Secondary | ICD-10-CM | POA: Diagnosis present

## 2016-07-03 DIAGNOSIS — J90 Pleural effusion, not elsewhere classified: Secondary | ICD-10-CM | POA: Diagnosis not present

## 2016-07-03 DIAGNOSIS — T82856A Stenosis of peripheral vascular stent, initial encounter: Principal | ICD-10-CM | POA: Diagnosis present

## 2016-07-03 DIAGNOSIS — T814XXD Infection following a procedure, subsequent encounter: Secondary | ICD-10-CM | POA: Diagnosis not present

## 2016-07-03 DIAGNOSIS — Z0389 Encounter for observation for other suspected diseases and conditions ruled out: Secondary | ICD-10-CM | POA: Diagnosis not present

## 2016-07-03 DIAGNOSIS — B957 Other staphylococcus as the cause of diseases classified elsewhere: Secondary | ICD-10-CM | POA: Diagnosis not present

## 2016-07-03 DIAGNOSIS — R278 Other lack of coordination: Secondary | ICD-10-CM | POA: Diagnosis not present

## 2016-07-03 DIAGNOSIS — Z9842 Cataract extraction status, left eye: Secondary | ICD-10-CM

## 2016-07-03 DIAGNOSIS — Z950 Presence of cardiac pacemaker: Secondary | ICD-10-CM | POA: Diagnosis not present

## 2016-07-03 DIAGNOSIS — R2689 Other abnormalities of gait and mobility: Secondary | ICD-10-CM | POA: Diagnosis not present

## 2016-07-03 DIAGNOSIS — I110 Hypertensive heart disease with heart failure: Secondary | ICD-10-CM | POA: Diagnosis present

## 2016-07-03 DIAGNOSIS — I272 Pulmonary hypertension, unspecified: Secondary | ICD-10-CM | POA: Diagnosis present

## 2016-07-03 DIAGNOSIS — J9691 Respiratory failure, unspecified with hypoxia: Secondary | ICD-10-CM | POA: Diagnosis not present

## 2016-07-03 DIAGNOSIS — B9689 Other specified bacterial agents as the cause of diseases classified elsewhere: Secondary | ICD-10-CM | POA: Diagnosis not present

## 2016-07-03 DIAGNOSIS — Z951 Presence of aortocoronary bypass graft: Secondary | ICD-10-CM

## 2016-07-03 DIAGNOSIS — M86672 Other chronic osteomyelitis, left ankle and foot: Secondary | ICD-10-CM | POA: Diagnosis present

## 2016-07-03 DIAGNOSIS — J9601 Acute respiratory failure with hypoxia: Secondary | ICD-10-CM | POA: Diagnosis not present

## 2016-07-03 DIAGNOSIS — I70245 Atherosclerosis of native arteries of left leg with ulceration of other part of foot: Secondary | ICD-10-CM | POA: Diagnosis not present

## 2016-07-03 DIAGNOSIS — I998 Other disorder of circulatory system: Secondary | ICD-10-CM | POA: Diagnosis not present

## 2016-07-03 DIAGNOSIS — Z9841 Cataract extraction status, right eye: Secondary | ICD-10-CM

## 2016-07-03 DIAGNOSIS — M868X7 Other osteomyelitis, ankle and foot: Secondary | ICD-10-CM | POA: Diagnosis not present

## 2016-07-03 DIAGNOSIS — E1165 Type 2 diabetes mellitus with hyperglycemia: Secondary | ICD-10-CM | POA: Diagnosis not present

## 2016-07-03 DIAGNOSIS — M86172 Other acute osteomyelitis, left ankle and foot: Secondary | ICD-10-CM | POA: Diagnosis not present

## 2016-07-03 DIAGNOSIS — E1159 Type 2 diabetes mellitus with other circulatory complications: Secondary | ICD-10-CM | POA: Diagnosis not present

## 2016-07-03 LAB — BASIC METABOLIC PANEL
ANION GAP: 8 (ref 5–15)
BUN: 25 mg/dL — ABNORMAL HIGH (ref 6–20)
CALCIUM: 10 mg/dL (ref 8.9–10.3)
CO2: 29 mmol/L (ref 22–32)
Chloride: 100 mmol/L — ABNORMAL LOW (ref 101–111)
Creatinine, Ser: 1.19 mg/dL (ref 0.61–1.24)
GFR, EST NON AFRICAN AMERICAN: 56 mL/min — AB (ref >60)
GLUCOSE: 168 mg/dL — AB (ref 65–99)
POTASSIUM: 3.7 mmol/L (ref 3.5–5.1)
Sodium: 137 mmol/L (ref 135–145)

## 2016-07-03 LAB — CBC WITH DIFFERENTIAL/PLATELET
BASOS ABS: 0 10*3/uL (ref 0.0–0.1)
BASOS PCT: 1 %
Eosinophils Absolute: 0.2 10*3/uL (ref 0.0–0.7)
Eosinophils Relative: 3 %
HEMATOCRIT: 37.3 % — AB (ref 39.0–52.0)
HEMOGLOBIN: 12 g/dL — AB (ref 13.0–17.0)
LYMPHS PCT: 15 %
Lymphs Abs: 1.3 10*3/uL (ref 0.7–4.0)
MCH: 28 pg (ref 26.0–34.0)
MCHC: 32.2 g/dL (ref 30.0–36.0)
MCV: 87.1 fL (ref 78.0–100.0)
MONO ABS: 0.9 10*3/uL (ref 0.1–1.0)
MONOS PCT: 11 %
NEUTROS ABS: 5.9 10*3/uL (ref 1.7–7.7)
NEUTROS PCT: 70 %
Platelets: 203 10*3/uL (ref 150–400)
RBC: 4.28 MIL/uL (ref 4.22–5.81)
RDW: 14.3 % (ref 11.5–15.5)
WBC: 8.3 10*3/uL (ref 4.0–10.5)

## 2016-07-03 LAB — GLUCOSE, CAPILLARY: Glucose-Capillary: 216 mg/dL — ABNORMAL HIGH (ref 65–99)

## 2016-07-03 LAB — PROTIME-INR
INR: 3.51
PROTHROMBIN TIME: 36 s — AB (ref 11.4–15.2)

## 2016-07-03 LAB — I-STAT TROPONIN, ED: TROPONIN I, POC: 0.04 ng/mL (ref 0.00–0.08)

## 2016-07-03 LAB — C-REACTIVE PROTEIN: CRP: 4.2 mg/dL — ABNORMAL HIGH (ref <1.0)

## 2016-07-03 LAB — BRAIN NATRIURETIC PEPTIDE: B Natriuretic Peptide: 178.7 pg/mL — ABNORMAL HIGH (ref 0.0–100.0)

## 2016-07-03 LAB — SEDIMENTATION RATE: SED RATE: 95 mm/h — AB (ref 0–16)

## 2016-07-03 MED ORDER — SPIRONOLACTONE 25 MG PO TABS
25.0000 mg | ORAL_TABLET | Freq: Every day | ORAL | Status: DC
  Administered 2016-07-04 – 2016-07-09 (×6): 25 mg via ORAL
  Filled 2016-07-03 (×6): qty 1

## 2016-07-03 MED ORDER — SODIUM CHLORIDE 0.9 % IV SOLN: 250.0000 mL | INTRAVENOUS | Status: DC | PRN

## 2016-07-03 MED ORDER — DONEPEZIL HCL 10 MG PO TABS
10.0000 mg | ORAL_TABLET | Freq: Every day | ORAL | Status: DC
Start: 2016-07-03 — End: 2016-07-22
  Administered 2016-07-03 – 2016-07-20 (×18): 10 mg via ORAL
  Filled 2016-07-03 (×19): qty 1

## 2016-07-03 MED ORDER — LISINOPRIL 10 MG PO TABS
10.0000 mg | ORAL_TABLET | Freq: Every day | ORAL | Status: DC
  Administered 2016-07-05 – 2016-07-12 (×7): 10 mg via ORAL
  Filled 2016-07-03 (×8): qty 1

## 2016-07-03 MED ORDER — EZETIMIBE-SIMVASTATIN 10-40 MG PO TABS
1.0000 | ORAL_TABLET | Freq: Every day | ORAL | Status: DC
  Administered 2016-07-03 – 2016-07-20 (×18): 1 via ORAL
  Filled 2016-07-03 (×19): qty 1

## 2016-07-03 MED ORDER — SODIUM CHLORIDE 0.9% FLUSH
3.0000 mL | Freq: Two times a day (BID) | INTRAVENOUS | Status: DC
  Administered 2016-07-03 – 2016-07-21 (×28): 3 mL via INTRAVENOUS

## 2016-07-03 MED ORDER — FUROSEMIDE 10 MG/ML IJ SOLN
80.0000 mg | Freq: Once | INTRAMUSCULAR | Status: AC
  Administered 2016-07-03: 80 mg via INTRAVENOUS
  Filled 2016-07-03: qty 8

## 2016-07-03 MED ORDER — INSULIN ASPART 100 UNIT/ML ~~LOC~~ SOLN
0.0000 [IU] | Freq: Three times a day (TID) | SUBCUTANEOUS | Status: DC
  Administered 2016-07-04: 5 [IU] via SUBCUTANEOUS
  Administered 2016-07-05 – 2016-07-07 (×2): 3 [IU] via SUBCUTANEOUS
  Administered 2016-07-07: 2 [IU] via SUBCUTANEOUS
  Administered 2016-07-08: 3 [IU] via SUBCUTANEOUS

## 2016-07-03 MED ORDER — INSULIN ASPART 100 UNIT/ML ~~LOC~~ SOLN
15.0000 [IU] | Freq: Three times a day (TID) | SUBCUTANEOUS | Status: DC
  Administered 2016-07-04 – 2016-07-08 (×10): 15 [IU] via SUBCUTANEOUS

## 2016-07-03 MED ORDER — ACETAMINOPHEN 325 MG PO TABS
650.0000 mg | ORAL_TABLET | ORAL | Status: DC | PRN
  Administered 2016-07-06: 650 mg via ORAL
  Filled 2016-07-03: qty 2

## 2016-07-03 MED ORDER — ONDANSETRON HCL 4 MG/2ML IJ SOLN: 4.0000 mg | Freq: Four times a day (QID) | INTRAMUSCULAR | Status: DC | PRN

## 2016-07-03 MED ORDER — FUROSEMIDE 10 MG/ML IJ SOLN
60.0000 mg | Freq: Two times a day (BID) | INTRAMUSCULAR | Status: DC
  Administered 2016-07-04 – 2016-07-05 (×3): 60 mg via INTRAVENOUS
  Filled 2016-07-03 (×3): qty 6

## 2016-07-03 MED ORDER — INSULIN GLARGINE 100 UNIT/ML ~~LOC~~ SOLN
15.0000 [IU] | Freq: Every day | SUBCUTANEOUS | Status: DC
Start: 2016-07-03 — End: 2016-07-07
  Administered 2016-07-03 – 2016-07-06 (×4): 15 [IU] via SUBCUTANEOUS
  Filled 2016-07-03 (×5): qty 0.15

## 2016-07-03 MED ORDER — CEPHALEXIN 500 MG PO CAPS
500.0000 mg | ORAL_CAPSULE | Freq: Three times a day (TID) | ORAL | Status: DC
  Administered 2016-07-03 – 2016-07-05 (×5): 500 mg via ORAL
  Filled 2016-07-03 (×2): qty 1
  Filled 2016-07-03: qty 2
  Filled 2016-07-03 (×2): qty 1

## 2016-07-03 MED ORDER — SODIUM CHLORIDE 0.9% FLUSH
3.0000 mL | INTRAVENOUS | Status: DC | PRN
  Administered 2016-07-19: 3 mL via INTRAVENOUS
  Filled 2016-07-03 (×2): qty 3

## 2016-07-03 NOTE — ED Notes (Signed)
NT to transport pt OTF

## 2016-07-03 NOTE — ED Provider Notes (Signed)
[facility] DEPT Provider Note   CSN: [id number] Arrival date & time: 07/03/16  1500     History   Chief Complaint Chief Complaint  Patient presents with  . Shortness of Breath    HPI [name]. is a 82 y.o. male.  The history is provided by the patient and the EMS personnel.  Shortness of Breath  This is a new problem. The average episode lasts 1 day. The problem occurs continuously.The current episode started 3 to 5 hours ago. The problem has not changed since onset.Pertinent negatives include no fever, no headaches, no cough, no sputum production, no chest pain, no vomiting, no abdominal pain and no leg swelling. Associated medical issues include CAD and heart failure.  -Pt was at ID clinic for L foot wound/chronic osteomyelitis follow up when found to be pale and hypoxic in 70s. O2 sats in 80s here. Pt denies Cp, SOB, fever, cough.  Past Medical History:  Diagnosis Date  . Aortal stenosis 2006   biophrostetic  . Arthritis   . Atrial fibrillation ([place])    onset after CABG--- coumadin management at Coumadin clinic  . Carotid artery disease ([place]) 09/27/2010   Carotid US 12/17: bilat ICA 40-59 >> 1 year FU  . CHF (congestive heart failure) ([place])    abter CABG  . Complete heart block ([place])   . Coronary artery disease   . DM type 2 (diabetes mellitus, type 2) ([place])    used to see Dr Meredith Pel, now f/u by Dr Larose Kells  . Erectile dysfunction   . Gangrene ([place])   . History of nephrolithiasis   . HTN (hypertension)   . Hyperlipidemia   . Memory loss   . Obesity   . OSA (obstructive sleep apnea)    not using CPAP  . Pacemaker 2/07   VVI  . PVD (peripheral vascular disease) ([name])    CT angio showed- vascular insuff, intestine and RAS bilaterally, andgiogram 02/2009 severe PVD medical management  . S/P aortic valve replacement   . Sick sinus syndrome ([name])    [name]  . Skin cancer    surgery (nose) 09-2010    Patient Active Problem List   Diagnosis  Date Noted  . Diabetic osteomyelitis ([place]) 07/03/2016  . Abscess of abdominal wall 06/02/2014  . Abdominal wall abscess 06/02/2014  . Hyponatremia 06/02/2014  . Cellulitis 12/23/2013  . Aftercare following surgery of the circulatory system, [place] 10/05/2013  . Encounter for therapeutic drug monitoring 07/18/2013  . Tingling 11/01/2012  . Foot ulcer ([place]) 09/24/2012  . Hypoxemia 08/04/2012  . Complete heart block ([place]) 07/26/2012  . Fatigue 01/20/2012  . Memory loss 01/20/2012  . Chronic diastolic heart failure ([place]) 09/01/2011  . Chronic atrial fibrillation ([place]) 08/21/2011  . Aortic valve stenosis, severe prosthetic 02/06/2011  . Paresthesia 12/16/2010  . Medicare annual wellness visit, initial 10/10/2010  . Carotid artery disease ([place]) 09/27/2010  . Long term (current) use of anticoagulants 09/26/2010  . THYROID NODULE 01/26/2010  . CLAUDICATION, INTERMITTENT 01/02/2009  . HIP PAIN, BILATERAL 01/02/2009  . HLD (hyperlipidemia) 08/16/2008  . MYOCARDIAL INFARCTION, HX OF 08/16/2008  . S/P AVR (aortic valve replacement) 08/16/2008  . PERIPHERAL VASCULAR DISEASE WITH CLAUDICATION 08/16/2008  . ANEMIA, HX OF 08/16/2008  . AORTIC VALVE REPLACEMENT, HX OF 08/16/2008  . PACEMAKER, PERMANENT 08/16/2008  . ERECTILE DYSFUNCTION 11/26/2006  . DM (diabetes mellitus) type II uncontrolled, periph vascular disorder ([place]) 07/28/2006  . OBESITY, MORBID 07/28/2006  . OBSTRUCTIVE SLEEP APNEA 07/28/2006  .  Essential hypertension 07/28/2006  . CAD (coronary artery disease) 07/28/2006  . RENAL ARTERY STENOSIS 07/28/2006  . INSUFFICIENCY, VASCULAR NOS, INTESTINE 07/28/2006  . NEPHROLITHIASIS, HX OF 07/28/2006    Past Surgical History:  Procedure Laterality Date  . ABDOMINAL AORTAGRAM N/A 11/10/2012   Procedure: ABDOMINAL Maxcine Ham;  Surgeon: Serafina Mitchell, MD;  Location: [facility] CATH LAB;  Service: Cardiovascular;  Laterality: N/A;  . AMPUTATION  01/2010   great toe  . ANGIOPLASTY / STENTING  FEMORAL  02/10/10   Left femoral  . AORTIC VALVE REPLACEMENT     biophrostetic for Ao Stenosis 2006  . AORTIC VALVE REPLACEMENT  01/17/2011   S/P redo median sternotomy, extracorporeal circulation, redo Aortic Valve Replacement using a 23-mm Edwards pericardial Magna-Ease valve, Dr Ulla Gallo  . CATARACT EXTRACTION, BILATERAL    . CORONARY ARTERY BYPASS GRAFT  2006  . EP IMPLANTABLE DEVICE N/A 06/06/2016   Procedure: PPM Generator Changeout;  Surgeon: Thompson Grayer, MD;  Location: [facility] CV LAB;  Service: Cardiovascular;  Laterality: N/A;  . EYE SURGERY    . INCISE AND DRAIN ABCESS Right May 2014   right heel   . LOWER EXTREMITY ANGIOGRAM Left 11/23/2012   Procedure: LOWER EXTREMITY ANGIOGRAM;  Surgeon: Serafina Mitchell, MD;  Location: [facility] CATH LAB;  Service: Cardiovascular;  Laterality: Left;  . PACEMAKER PLACEMENT  07/2005   Medtronic Sigma SR implanted by Dr Olevia Perches  . TONSILLECTOMY         Home Medications    Prior to Admission medications   Medication Sig Start Date End Date Taking? Authorizing Provider  cephALEXin (KEFLEX) 500 MG capsule take 1 capsule by mouth every 8 hours 06/24/16   Historical Provider, MD  donepezil (ARICEPT) 10 MG tablet Take 1 tablet (10 mg total) by mouth at bedtime. 05/08/16   Kathrynn Ducking, MD  ezetimibe-simvastatin (VYTORIN) 10-40 MG per tablet Take 1 tablet by mouth at bedtime. 05/06/12   Larey Dresser, MD  furosemide (LASIX) 80 MG tablet Take one tablet by mouth every morning and then one-half tablet by mouth every other day in the afternoon 03/28/16   Larey Dresser, MD  insulin glargine (LANTUS) 100 UNIT/ML injection Inject 0.15 mLs (15 Units total) into the skin at bedtime. 06/07/14   Belkys A Regalado, MD  Insulin Glulisine (APIDRA [place]) Inject 35 Units into the skin 3 (three) times daily with meals.     Historical Provider, MD  lisinopril (PRINIVIL,ZESTRIL) 10 MG tablet Take 1 tablet (10 mg total) by mouth daily. 07/25/15   Liliane Shi, PA-C    spironolactone (ALDACTONE) 25 MG tablet Take 1 tablet (25 mg total) by mouth daily. 08/10/15   Larey Dresser, MD  warfarin (COUMADIN) 5 MG tablet Take as directed by coumadin clinic 09/05/15   Larey Dresser, MD    Family History Family History  Problem Relation Age of Onset  . Heart attack Father 19  . Heart disease Father     Before age 37  . Hypertension Father   . Stroke Mother 24  . Cancer Sister     Cervical  . Heart attack Sister   . Heart disease Sister   . Diabetes Sister     2 sister w/ DM  . Heart attack Sister   . Heart attack Sister   . Heart disease Sister 48    AAA  and  Stomach Aneurysm  . AAA (abdominal aortic aneurysm) Sister   . Heart disease Sister  Before age 64  . Heart attack Sister 75  . Colon cancer Neg Hx   . Prostate cancer Neg Hx     Social History Social History  Substance Use Topics  . Smoking status: Former Smoker    Types: Cigarettes    Quit date: 06/23/1988  . Smokeless tobacco: Former Systems developer  . Alcohol use No     Allergies   Potassium-containing compounds and Metoprolol succinate   Review of Systems Review of Systems  Constitutional: Negative for chills and fever.  Respiratory: Negative for cough, sputum production and shortness of breath (? pt denies).   Cardiovascular: Negative for chest pain, palpitations and leg swelling.  Gastrointestinal: Negative for abdominal pain, diarrhea, nausea and vomiting.  Skin: Positive for wound (L foot wound).  Neurological: Negative for syncope, light-headedness and headaches.  All other systems reviewed and are negative.    Physical Exam Updated Vital Signs BP 112/63 (BP Location: Left Arm)   Pulse 70   Temp 98 F (36.7 C) (Oral)   Resp (!) 28   Ht 5\' 8"  (1.727 m)   Wt 89.4 kg   SpO2 95%   BMI 29.95 kg/m   Physical Exam  Constitutional: He appears well-developed and well-nourished. No distress.  HENT:  Head: Normocephalic and atraumatic.  Mouth/Throat: Oropharynx is clear  and moist.  Eyes: Conjunctivae are normal.  Neck: Neck supple.  Cardiovascular: Normal rate and regular rhythm.   No murmur heard. Pulmonary/Chest: Effort normal. Tachypnea noted. No respiratory distress. He has no wheezes. He has rales (R>L rales up to mid lung fields).  Abdominal: Soft. He exhibits no distension. There is no tenderness.  Musculoskeletal: He exhibits no edema.  Neurological: He is alert.  Skin: Skin is warm and dry. He is not diaphoretic.  Psychiatric: He has a normal mood and affect.  Nursing note and vitals reviewed.    ED Treatments / Results  Labs (all labs ordered are listed, but only abnormal results are displayed) Labs Reviewed  BASIC METABOLIC PANEL - Abnormal; Notable for the following:       Result Value   Chloride 100 (*)    Glucose, Bld 168 (*)    BUN 25 (*)    GFR calc non Af Amer 56 (*)    All other components within normal limits  CBC WITH DIFFERENTIAL/PLATELET - Abnormal; Notable for the following:    Hemoglobin 12.0 (*)    HCT 37.3 (*)    All other components within normal limits  C-REACTIVE PROTEIN - Abnormal; Notable for the following:    CRP 4.2 (*)    All other components within normal limits  PROTIME-INR - Abnormal; Notable for the following:    Prothrombin Time 36.0 (*)    All other components within normal limits  BRAIN NATRIURETIC PEPTIDE  SEDIMENTATION RATE  I-STAT TROPOININ, ED    EKG  EKG Interpretation None       Radiology Dg Chest 2 View  Result Date: 07/03/2016 CLINICAL DATA:  Left foot wound EXAM: CHEST  2 VIEW COMPARISON:  06/20/2016 FINDINGS: Cardiac shadow remains enlarged. Pacing device is again seen and stable. Postsurgical changes are again noted and stable. Increasing right-sided effusion and underlying infiltrate/atelectasis is present. The left lung remains clear. No bony abnormality is seen. IMPRESSION: Increasing right-sided effusion and likely underlying infiltrate/atelectasis. Electronically Signed   By:  Inez Catalina M.D.   On: 07/03/2016 16:05   Dg Foot Complete Left  Result Date: 07/03/2016 CLINICAL DATA:  82 year old male with a  history of possible persisting osteomyelitis EXAM: LEFT FOOT - COMPLETE 3+ VIEW COMPARISON:  MRI 06/20/2016 plain film 05/20/2016 FINDINGS: Re- demonstration of prior left toe amputation. Compare to the prior plain film there is increasing erosive changes at the distal first metatarsal. Overlying skin/soft tissue ulceration with gauze dressing in place. Dense calcifications of the pedal vessels and visualized tibial vessels. No displaced fracture. Degenerative changes of the forefoot and midfoot. IMPRESSION: Compared to the prior plain film there appears to be progressing erosive changes at dorsal aspect of the distal first metatarsal, compatible with persisting osteomyelitis which was demonstrated on prior MRI. Soft tissue ulceration along the medial forefoot. Dense calcifications of the tibial vessels and pedal vessels. Correlation with ankle-brachial index/noninvasive imaging may be useful to evaluate wound healing capability. Signed, Dulcy Fanny. Earleen Newport, DO [facility] Radiology Electronically Signed   By: Corrie Mckusick D.O.   On: 07/03/2016 16:09    Procedures Procedures (including critical care time)  Medications Ordered in ED Medications  furosemide (LASIX) injection 80 mg (80 mg Intravenous Given 07/03/16 1619)     Initial Impression / Assessment and Plan / ED Course  I have reviewed the triage vital signs and the nursing notes.  Pertinent labs & imaging results that were available during my care of the patient were reviewed by me and considered in my medical decision making (see chart for details).  Clinical Course    Patient is a 82 year old male with history of CHF, complete heart block status post pacemaker, A. fib, CABG, hypertension, peripheral vascular disease, osteomyelitis who presents with hypoxia from  infectious disease office. Patient was noted to be in the 70s on room air. Patient denies any chest pain or shortness of breath however the wife states that he has been having worsening shortness of breath.  On exam patient has right greater than the left crackles but no significant peripheral edema. Patient with O2 sats in the mid 90s on 2 L nasal cannula. Patient likely having a CHF exacerbation. Doubt pneumonia given no fever or cough symptoms.  At for basic labs including troponin and BNP. Chest x-ray obtained showing increasing right-sided pleural effusion with underlying infiltrate/atelectasis. First troponin is negative. Foot XR obtained to screen for osteomyelitis cause initially I did not realize pt already had previous workup. It shows continued osteomyelitis.   Pt given IV lasix 80mg .  Patient will be admitted to medicine for further management of CHF and to consult infectious disease if needed for his left foot osteomyelitis.  Final Clinical Impressions(s) / ED Diagnoses   Final diagnoses:  Congestive heart failure, unspecified congestive heart failure chronicity, unspecified congestive heart failure type [facility])  Hypoxia    New Prescriptions New Prescriptions   No medications on file     Tobie Poet, DO 07/03/16 [place], MD 07/06/16 1458

## 2016-07-03 NOTE — ED Triage Notes (Signed)
Pt BIB [facility] from PCP where pt was at a routine check up for staph infection in foot. PCP reports pt presented to office pale, SOB, and O2 sat in 70's. EMS reports absent lower lung sounds and wife reports pt has been sick X3 days, pt denies.

## 2016-07-03 NOTE — H&P (Addendum)
HISTORY AND PHYSICAL       PATIENT DETAILS Name: [name]. Age: 82 y.o. Sex: male Date of Birth: 26-Sep-1934 Admit Date: 07/03/2016 [id number] Paz, MD   Patient coming from: Home   CHIEF COMPLAINT:  Shortness of breath for the past few days Referred from infectious disease office where he was found to be hypoxic with his pulse ox in the 70s.  HPI: [name]. is a 82 y.o. male with medical history significant of severe AS status post redo aVR (bioprosthetic valve), chronic diastolic heart failure, CAD status post CABG, chronic atrial fibrillation on Coumadin, history of left atrial thrombus noted on TEE in 7/12, history of peripheral vascular disease, left foot ulcer with osteomyelitis on Keflex was referred from the infectious disease MDs office for evaluation of hypoxia.  Please note, patient apparently has some amount of dementia and is a poor historian. His spouse at bedside provides most of this history.  Apparently for the past few days, his pulse is noted that the patient has had some amount of exertional dyspnea. She is also noted some cough that is mostly dry. He apparently has been weak for the past few days and has been sleeping a lot. She denies any obvious fever, nausea, vomiting.  Patient had a scheduled visit with Dr.Comer for left foot osteomyelitis, he was found to be hypoxic with his pulse ox in the 70s. He was felt to have possible CHF versus pneumonia and referred to the ED. I was subsequently asked to admit this patient for further evaluation  ED Course:  Chest x-ray in the emergency room showed right-sided pleural effusion, and underlying infiltrate/atelectasis. Patient was felt to have acute diastolic heart failure, was given 1 dose of IV Lasix, and I was asked to admit this patient for further evaluation and treatment.  Note: Lives at: Home Mobility:  Independent Chronic Indwelling Foley:no   REVIEW OF SYSTEMS:  Constitutional:     No  weight loss, night sweats,  fatigue.  HEENT:    No headaches, Dysphagia,Tooth/dental problems,Sore throat  Cardio-vascular: No chest pain,Orthopnea, PND,lower extremity edema, anasarca, palpitations  GI:  No heartburn, indigestion, abdominal pain, nausea, vomiting, diarrhea, melena or hematochezia  Resp: No , hemoptysis,plueritic chest pain.   Skin:  No rash or lesions.  GU:  No dysuria, change in color of urine, no urgency or frequency.  No flank pain.  Musculoskeletal: No joint pain or swelling.  No decreased range of motion.  No back pain.  Endocrine: No heat intolerance, no cold intolerance, no polyuria, no polydipsia  Psych: No change in mood or affect. No depression or anxiety.  No memory loss.   ALLERGIES:   Allergies  Allergen Reactions  . Potassium-Containing Compounds Anaphylaxis    IV--loss of conciousness  . Metoprolol Succinate Other (See Comments)    Extremely tired    PAST MEDICAL HISTORY: Past Medical History:  Diagnosis Date  . Aortal stenosis 2006   biophrostetic  . Arthritis   . Atrial fibrillation ([place])    onset after CABG--- coumadin management at Coumadin clinic  . Carotid artery disease ([place]) 09/27/2010   Carotid US 12/17: bilat ICA 40-59 >> 1 year FU  . CHF (congestive heart failure) ([place])    abter CABG  . Complete heart block ([place])   . Coronary artery disease   . DM type 2 (diabetes mellitus, type 2) ([place])    used to see Dr Meredith Pel, now f/u by Dr  Paz  . Erectile dysfunction   . Gangrene ([place])   . History of nephrolithiasis   . HTN (hypertension)   . Hyperlipidemia   . Memory loss   . Obesity   . OSA (obstructive sleep apnea)    not using CPAP  . Pacemaker 2/07   VVI  . PVD (peripheral vascular disease) ([name])    CT angio showed- vascular insuff, intestine and RAS bilaterally, andgiogram 02/2009 severe PVD medical management  . S/P aortic valve replacement   . Sick sinus syndrome ([name])    [name]  . Skin  cancer    surgery (nose) 09-2010    PAST SURGICAL HISTORY: Past Surgical History:  Procedure Laterality Date  . ABDOMINAL AORTAGRAM N/A 11/10/2012   Procedure: ABDOMINAL Maxcine Ham;  Surgeon: Serafina Mitchell, MD;  Location: [facility] CATH LAB;  Service: Cardiovascular;  Laterality: N/A;  . AMPUTATION  01/2010   great toe  . ANGIOPLASTY / STENTING FEMORAL  02/10/10   Left femoral  . AORTIC VALVE REPLACEMENT     biophrostetic for Ao Stenosis 2006  . AORTIC VALVE REPLACEMENT  01/17/2011   S/P redo median sternotomy, extracorporeal circulation, redo Aortic Valve Replacement using a 23-mm Edwards pericardial Magna-Ease valve, Dr Ulla Gallo  . CATARACT EXTRACTION, BILATERAL    . CORONARY ARTERY BYPASS GRAFT  2006  . EP IMPLANTABLE DEVICE N/A 06/06/2016   Procedure: PPM Generator Changeout;  Surgeon: Thompson Grayer, MD;  Location: [facility] CV LAB;  Service: Cardiovascular;  Laterality: N/A;  . EYE SURGERY    . INCISE AND DRAIN ABCESS Right May 2014   right heel   . LOWER EXTREMITY ANGIOGRAM Left 11/23/2012   Procedure: LOWER EXTREMITY ANGIOGRAM;  Surgeon: Serafina Mitchell, MD;  Location: [facility] CATH LAB;  Service: Cardiovascular;  Laterality: Left;  . PACEMAKER PLACEMENT  07/2005   Medtronic Sigma SR implanted by Dr Olevia Perches  . TONSILLECTOMY      MEDICATIONS AT HOME: Prior to Admission medications   Medication Sig Start Date End Date Taking? Authorizing Provider  cephALEXin (KEFLEX) 500 MG capsule take 1 capsule by mouth every 8 hours 06/24/16  Yes Historical Provider, MD  donepezil (ARICEPT) 10 MG tablet Take 1 tablet (10 mg total) by mouth at bedtime. 05/08/16  Yes Kathrynn Ducking, MD  ezetimibe-simvastatin (VYTORIN) 10-40 MG per tablet Take 1 tablet by mouth at bedtime. 05/06/12  Yes Larey Dresser, MD  furosemide (LASIX) 80 MG tablet Take one tablet by mouth every morning and then one-half tablet by mouth every other day in the afternoon 03/28/16  Yes Larey Dresser, MD  insulin glargine (LANTUS)  100 UNIT/ML injection Inject 0.15 mLs (15 Units total) into the skin at bedtime. 06/07/14  Yes Belkys A Regalado, MD  Insulin Glulisine (APIDRA [place]) Inject 35 Units into the skin 3 (three) times daily with meals.    Yes Historical Provider, MD  lisinopril (PRINIVIL,ZESTRIL) 10 MG tablet Take 1 tablet (10 mg total) by mouth daily. 07/25/15  Yes Scott Joylene Draft, PA-C  spironolactone (ALDACTONE) 25 MG tablet Take 1 tablet (25 mg total) by mouth daily. 08/10/15  Yes Larey Dresser, MD  warfarin (COUMADIN) 5 MG tablet Take as directed by coumadin clinic Patient taking differently: Take 5 mg by mouth daily. Take as directed by coumadin clinic 09/05/15  Yes Larey Dresser, MD    FAMILY HISTORY: Family History  Problem Relation Age of Onset  . Heart attack Father 78  . Heart disease Father  Before age 81  . Hypertension Father   . Stroke Mother 34  . Cancer Sister     Cervical  . Heart attack Sister   . Heart disease Sister   . Diabetes Sister     2 sister w/ DM  . Heart attack Sister   . Heart attack Sister   . Heart disease Sister 57    AAA  and  Stomach Aneurysm  . AAA (abdominal aortic aneurysm) Sister   . Heart disease Sister     Before age 105  . Heart attack Sister 39  . Colon cancer Neg Hx   . Prostate cancer Neg Hx      SOCIAL HISTORY:  reports that he quit smoking about 28 years ago. His smoking use included Cigarettes. He has quit using smokeless tobacco. He reports that he does not drink alcohol or use drugs.  PHYSICAL EXAM: Blood pressure 126/73, pulse 64, temperature 98 F (36.7 C), temperature source Oral, resp. rate 20, height 5\' 8"  (1.727 m), weight 89.4 kg (197 lb), SpO2 94 %.  General appearance :Awake, alert, not in any distress. Speech Clear. Chronically sick appearing Eyes:, pupils equally reactive to light and accomodation,no scleral icterus. HEENT: Atraumatic and Normocephalic Neck: supple, no JVD. No cervical lymphadenopathy. No thyromegaly Resp: Decreased  air entry on the right lung to approximate mid base-rales heard at the mid base area. Otherwise clear to auscultation. CVS: S1 S2 irregular, + systolic murmur  GI: Bowel sounds present, Non tender and not distended with no gaurding, rigidity or rebound.No organomegaly Extremities: B/L Lower Ext shows trace edema, both legs are warm to touch. Ulcer noted at the site of great toe amputation-no purulent discharge seen. No significant surrounding erythema Neurology:  speech clear,Non focal, sensation is grossly intact. Psychiatric: . Alert and oriented x 3. Normal mood. Musculoskeletal:No digital cyanosis Skin:No Rash, warm and dry Wounds: See above  LABS ON ADMISSION:  I have personally reviewed following labs and imaging studies  CBC:  Recent Labs Lab 07/03/16 1540  WBC 8.3  NEUTROABS 5.9  HGB 12.0*  HCT 37.3*  MCV 87.1  PLT 123456    Basic Metabolic Panel:  Recent Labs Lab 07/03/16 1540  NA 137  K 3.7  CL 100*  CO2 29  GLUCOSE 168*  BUN 25*  CREATININE 1.19  CALCIUM 10.0    GFR: Estimated Creatinine Clearance: 54.7 mL/min (by C-G formula based on SCr of 1.19 mg/dL).  Liver Function Tests: No results for input(s): AST, ALT, ALKPHOS, BILITOT, PROT, ALBUMIN in the last 168 hours. No results for input(s): LIPASE, AMYLASE in the last 168 hours. No results for input(s): AMMONIA in the last 168 hours.  Coagulation Profile:  Recent Labs Lab 07/03/16 1540  INR 3.51    Cardiac Enzymes: No results for input(s): CKTOTAL, CKMB, CKMBINDEX, TROPONINI in the last 168 hours.  BNP (last 3 results) No results for input(s): PROBNP in the last 8760 hours.  HbA1C: No results for input(s): HGBA1C in the last 72 hours.  CBG: No results for input(s): GLUCAP in the last 168 hours.  Lipid Profile: No results for input(s): CHOL, HDL, LDLCALC, TRIG, CHOLHDL, LDLDIRECT in the last 72 hours.  Thyroid Function Tests: No results for input(s): TSH, T4TOTAL, FREET4, T3FREE,  THYROIDAB in the last 72 hours.  Anemia Panel: No results for input(s): VITAMINB12, FOLATE, FERRITIN, TIBC, IRON, RETICCTPCT in the last 72 hours.  Urine analysis:    Component Value Date/Time   COLORURINE YELLOW 01/14/2011 1149  APPEARANCEUR CLEAR 01/14/2011 1149   LABSPEC 1.011 01/14/2011 1149   PHURINE 7.0 01/14/2011 1149   GLUCOSEU NEGATIVE 01/14/2011 1149   HGBUR NEGATIVE 01/14/2011 [place] 01/14/2011 1149   KETONESUR NEGATIVE 01/14/2011 1149   PROTEINUR NEGATIVE 01/14/2011 1149   UROBILINOGEN 1.0 01/14/2011 1149   NITRITE NEGATIVE 01/14/2011 1149   LEUKOCYTESUR TRACE (A) 01/14/2011 1149    Sepsis Labs: Lactic Acid, Venous No results found for: [place]   Microbiology: No results found for this or any previous visit (from the past 240 hour(s)).    RADIOLOGIC STUDIES ON ADMISSION: Dg Chest 2 View  Result Date: 07/03/2016 CLINICAL DATA:  Left foot wound EXAM: CHEST  2 VIEW COMPARISON:  06/20/2016 FINDINGS: Cardiac shadow remains enlarged. Pacing device is again seen and stable. Postsurgical changes are again noted and stable. Increasing right-sided effusion and underlying infiltrate/atelectasis is present. The left lung remains clear. No bony abnormality is seen. IMPRESSION: Increasing right-sided effusion and likely underlying infiltrate/atelectasis. Electronically Signed   By: Inez Catalina M.D.   On: 07/03/2016 16:05   Dg Foot Complete Left  Result Date: 07/03/2016 CLINICAL DATA:  82 year old male with a history of possible persisting osteomyelitis EXAM: LEFT FOOT - COMPLETE 3+ VIEW COMPARISON:  MRI 06/20/2016 plain film 05/20/2016 FINDINGS: Re- demonstration of prior left toe amputation. Compare to the prior plain film there is increasing erosive changes at the distal first metatarsal. Overlying skin/soft tissue ulceration with gauze dressing in place. Dense calcifications of the pedal vessels and visualized tibial vessels. No displaced fracture.  Degenerative changes of the forefoot and midfoot. IMPRESSION: Compared to the prior plain film there appears to be progressing erosive changes at dorsal aspect of the distal first metatarsal, compatible with persisting osteomyelitis which was demonstrated on prior MRI. Soft tissue ulceration along the medial forefoot. Dense calcifications of the tibial vessels and pedal vessels. Correlation with ankle-brachial index/noninvasive imaging may be useful to evaluate wound healing capability. Signed, Dulcy Fanny. Earleen Newport, DO [facility] Electronically Signed   By: Corrie Mckusick D.O.   On: 07/03/2016 16:09    I have personally reviewed images of chest xray -right pleural effusion  EKG:  Paced rhythm  ASSESSMENT AND PLAN: Present on Admission: . Acute hypoxemic respiratory failure: Probably secondary to acute diastolic heart failure, although he has a right-sided pleural effusion with possible underlying infiltrate-he has no clinical signs of pneumonia. Plans are to start IV Lasix and follow. Check influenza PCR .See below regarding pleural effusion  . Right-sided pleural effusion: Suspect this is secondary to underlying congestive heart failure, although the x-ray of the chest suggests right-sided infiltrate-he has no clinical features suggestive of pneumonia. He however is on Keflex that could mask infectious symptoms. I do not think that at this time he needs escalation in antimicrobial therapy-and would continue Keflex. Would start IV Lasix, and see if we could have IR do a diagnostic thoracocentesis tomorrow morning. Post thoracocentesis, we could obtain a CT of the chest to see if there is a underlying pneumonia. I will order routine routine thoracocentesis labs with cytology-I have also ordered a serum LDH for tomorrow morning.   . Acute on chronic diastolic CHF: Per spouse his weight at the infectious disease clinic was approximately 197 pounds-he  apparently normally weighs approximately 195 pounds. He does have some trace lower extremity edema-and apart from right-sided pleural effusion-he does not have any other signs of decompensation. For now place on IV Lasix, and follow clinical course.  See above regarding right-sided pleural effusion/thoracentesis.  . Left foot also with underlying osteomyelitis: Followed by [facility] (Dr. Trula Slade) and at the wound care clinic. MRI of his left foot done on 06/20/16 was suggestive of osteomyelitis. Continue Keflex, will obtain wound care evaluation. Per spouse, patient was scheduled to undergo a Doppler ultrasound tomorrow, I have ordered it so he can get it while he is in the hospital.  Depending upon his workup regarding his pleural effusions/CHF and arterial Doppler, may need previous input regarding wound debridement/amputation at some point during this hospital stay.  Marland KitchenPAD: Followed by Dr. Marnee Guarneri left superficial femoral artery stent on 02/10/10 & 11/23/12, and amputation left great toe in 2011. Unfortunately, patient has developed a ulcer with MRI evidence of osteomyelitis at the left great toe amputation site. Per spouse, patient was scheduled to undergo a Doppler ultrasound tomorrow, I have ordered it so he can get it while he is in the hospital. See above  .CORONARY ARTERY DISEASE s/p CABG with AVR in 1/06:Stable-no chest pain. Per cards outpatient note-patient had patent grafts on 7/12 cath. Denies chest pain.  Continue  Vytorin, ACEI. Suspect not on aspirin as on Coumadin.  Marland KitchenPermanent atrial fibrillation: Rate controlled. On Coumadin-pharmacy to manage while inpatient.  Marland Kitchen LAA thrombus noted on TEE prior to [id number] coumadin per pharmacy. Will need be bridged with heparin or Lovenox if he needs to come off coumadin for any procedures.  . History of complete heart block: Permanent pacemaker in place. Underwent recent battery change by Dr. Rayann Heman.  Marland Kitchen History of aortic valve replacement:s/p  bioprosthetic aortic valve replacement in 06/2004-subsequently patient had redo AVR (7/12) with  pericardial valve  . Insulin-dependent type 2 diabetes: Continue Lantus-start 15 units of NovoLog, SSI. Follow CBGs and adjust accordingly.   . Dementia/cognitive dysfunction: Followed by neurology (Dr. Jannifer Franklin )-on Aricept. Seems to be awake and alert-answers most of my questions appropriately.   Further plan will depend as patient's clinical course evolves and further radiologic and laboratory data become available. Patient will be monitored closely.  Above noted plan was discussed with patient/spouse face to face at bedside, they were in agreement.   CONSULTS: None  DVT Prophylaxis: On coumadin  Code Status: Full Code  Disposition Plan:  Discharge back home with home health services in 2-3  days, depending on clinical course  Admission status: Inpatient going to telemetry  Total time spent  55 minutes.Greater than 50% of this time was spent in counseling, explanation of diagnosis, planning of further management, and coordination of care.  [facility] Pager [phone]  If 7PM-7AM, please contact night-coverage [email or web address] Password [facility] 07/03/2016, 6:09 PM

## 2016-07-03 NOTE — ED Notes (Signed)
ED Provider at bedside. 

## 2016-07-03 NOTE — Progress Notes (Signed)
ANTICOAGULATION CONSULT NOTE - Initial Consult  Pharmacy Consult for warfarin  Indication: atrial fibrillation  Allergies  Allergen Reactions  . Potassium-Containing Compounds Anaphylaxis    IV--loss of conciousness  . Metoprolol Succinate Other (See Comments)    Extremely tired    Patient Measurements: Height: 5\' 8"  (172.7 cm) Weight: 197 lb (89.4 kg) IBW/kg (Calculated) : 68.4  Vital Signs: Temp: 98 F (36.7 C) (01/11 1509) Temp Source: Oral (01/11 1509) BP: 126/73 (01/11 1645) Pulse Rate: 64 (01/11 1645)  Labs:  Recent Labs  07/03/16 1540  HGB 12.0*  HCT 37.3*  PLT 203  LABPROT 36.0*  INR 3.51  CREATININE 1.19    Medical History: Past Medical History:  Diagnosis Date  . Aortal stenosis 2006   biophrostetic  . Arthritis   . Atrial fibrillation ([place])    onset after CABG--- coumadin management at Coumadin clinic  . Carotid artery disease ([place]) 09/27/2010   Carotid US 12/17: bilat ICA 40-59 >> 1 year FU  . CHF (congestive heart failure) ([place])    abter CABG  . Complete heart block ([place])   . Coronary artery disease   . DM type 2 (diabetes mellitus, type 2) ([place])    used to see Dr Meredith Pel, now f/u by Dr Larose Kells  . Erectile dysfunction   . Gangrene ([place])   . History of nephrolithiasis   . HTN (hypertension)   . Hyperlipidemia   . Memory loss   . Obesity   . OSA (obstructive sleep apnea)    not using CPAP  . Pacemaker 2/07   VVI  . PVD (peripheral vascular disease) ([name])    CT angio showed- vascular insuff, intestine and RAS bilaterally, andgiogram 02/2009 severe PVD medical management  . S/P aortic valve replacement   . Sick sinus syndrome ([name])    [name]  . Skin cancer    surgery (nose) 09-2010    Assessment: 82 yo male admitted with SOB. On warfarin prior to admission for history of AVR, prior left atrial appendage thrombus and AFib. Current INR 3.5. Pt has a right-sided pleural effusion. Planning to hold warfarin incase patient requires  a thoracentesis.  PTA warfarin dose: 7.5 mg po qFridays, 5 mg po all other days  Goal of Therapy:  INR 2-3 Monitor platelets by anticoagulation protocol: Yes    Plan:  -Hold warfarin -Daily INR   Harvel Quale 07/03/2016,6:41 PM

## 2016-07-03 NOTE — ED Notes (Signed)
Pt returned to room from XR and placed back on monitor.

## 2016-07-03 NOTE — ED Notes (Signed)
Pt requesting to go to restroom, this RN told pt we could compromise and stand at bedside to use urinal. Made pt aware of drop in O2 last time pt ambulated. Pt stood to use urinal, on 2L [place] O2 dropped to 88% standing.

## 2016-07-03 NOTE — ED Notes (Signed)
hospitalist at bedside. MD states pt can eat. [place] sandwich given.

## 2016-07-03 NOTE — ED Notes (Signed)
Patient transported to X-ray 

## 2016-07-03 NOTE — ED Notes (Signed)
Attempted report 

## 2016-07-03 NOTE — Progress Notes (Signed)
[place] for Infectious Disease      Reason for Consult: osteomyelitis    Referring Physician: Dr. Con Memos    Patient ID: Seth Pacific., male    DOB: 1935/02/14, 82 y.o.   MRN: [name]  HPI:   He is here for evaluation of his left foot chronic infection.  He has a history of PVD, poorly controlled diabetes with last HgbA1c at 12 though was several years ago (2011) and relatively recently an ulcer that has developed at the amputation site over the last 3-4 months.  This has been poorly healing despite antibiotics and wound care by Dr. Con Memos.  He had a bone biopsy done that showed soft tissue inflammation and no definitive acute osteomyelitis.  He also had an MRI that noted enhancement of the distal half c/w osteomyelitis.  He saw Vinnie Level Nickel of Dr. Trula Slade office and was going for doppler studies.   He also was noted to have an O2 sat of 78% initially when he walked in.  It did improve to about 85% at rest after several minutes.  He was using accessory muscles initially as well, but not at rest.    Past Medical History:  Diagnosis Date  . Aortal stenosis 2006   biophrostetic  . Arthritis   . Atrial fibrillation ([place])    onset after CABG--- coumadin management at [facility] clinic  . Carotid artery disease ([place]) 09/27/2010   Carotid US 12/17: bilat ICA 40-59 >> 1 year FU  . CHF (congestive heart failure) ([place])    abter CABG  . Complete heart block ([place])   . Coronary artery disease   . DM type 2 (diabetes mellitus, type 2) ([place])    used to see Dr Meredith Pel, now f/u by Dr Larose Kells  . Erectile dysfunction   . Gangrene ([place])   . History of nephrolithiasis   . HTN (hypertension)   . Hyperlipidemia   . Memory loss   . Obesity   . OSA (obstructive sleep apnea)    not using CPAP  . Pacemaker 2/07   VVI  . PVD (peripheral vascular disease) ([name])    CT angio showed- vascular insuff, intestine and RAS bilaterally, andgiogram 02/2009 severe PVD medical management  . S/P aortic  valve replacement   . Sick sinus syndrome ([name])    [name]  . Skin cancer    surgery (nose) 09-2010    Prior to Admission medications   Medication Sig Start Date End Date Taking? Authorizing Provider  cephALEXin (KEFLEX) 500 MG capsule take 1 capsule by mouth every 8 hours 06/24/16  Yes Historical Provider, MD  donepezil (ARICEPT) 10 MG tablet Take 1 tablet (10 mg total) by mouth at bedtime. 05/08/16  Yes Kathrynn Ducking, MD  ezetimibe-simvastatin (VYTORIN) 10-40 MG per tablet Take 1 tablet by mouth at bedtime. 05/06/12  Yes Larey Dresser, MD  furosemide (LASIX) 80 MG tablet Take one tablet by mouth every morning and then one-half tablet by mouth every other day in the afternoon 03/28/16  Yes Larey Dresser, MD  insulin glargine (LANTUS) 100 UNIT/ML injection Inject 0.15 mLs (15 Units total) into the skin at bedtime. 06/07/14  Yes Belkys A Regalado, MD  Insulin Glulisine (APIDRA [place]) Inject 35 Units into the skin 3 (three) times daily with meals.    Yes Historical Provider, MD  lisinopril (PRINIVIL,ZESTRIL) 10 MG tablet Take 1 tablet (10 mg total) by mouth daily. 07/25/15  Yes Liliane Shi, PA-C  spironolactone (  ALDACTONE) 25 MG tablet Take 1 tablet (25 mg total) by mouth daily. 08/10/15  Yes Larey Dresser, MD  warfarin (COUMADIN) 5 MG tablet Take as directed by coumadin clinic 09/05/15  Yes Larey Dresser, MD    Allergies  Allergen Reactions  . Potassium-Containing Compounds Anaphylaxis    IV--loss of conciousness  . Metoprolol Succinate Other (See Comments)    Extremely tired    Social History  Substance Use Topics  . Smoking status: Former Smoker    Types: Cigarettes    Quit date: 06/23/1988  . Smokeless tobacco: Former Systems developer  . Alcohol use No    Family History  Problem Relation Age of Onset  . Heart attack Father 8  . Heart disease Father     Before age 66  . Hypertension Father   . Stroke Mother 16  . Cancer Sister     Cervical  . Heart attack Sister     . Heart disease Sister   . Diabetes Sister     2 sister w/ DM  . Heart attack Sister   . Heart attack Sister   . Heart disease Sister 41    AAA  and  Stomach Aneurysm  . AAA (abdominal aortic aneurysm) Sister   . Heart disease Sister     Before age 30  . Heart attack Sister 4  . Colon cancer Neg Hx   . Prostate cancer Neg Hx     Review of Systems  Constitutional: negative for anorexia Respiratory: positive for dyspnea on exertion, negative for cough or sputum Integument/breast: negative for rash Musculoskeletal: negative for does not feel much in his feet or at site of ulcer All other systems reviewed and are negative    Constitutional: in no apparent distress and alert  Vitals:   07/03/16 1359  BP: 113/72  Pulse: 90  Temp: 97.6 F (36.4 C)   EYES: anicteric ENMT: no thrush Cardiovascular: Cor RRR Respiratory: no air movement right side about half way up, distant on left and upper right, no wheezes, no crackles GI: Bowel sounds are normal, liver is not enlarged, spleen is not enlarged Musculoskeletal: he left foot has a notable ulcer at site of previous ampuation, some white coating (wound care), no pus, no drainage; edema of foot Skin: negatives: no rash Hematologic: no cervical lad  Labs: Lab Results  Component Value Date   WBC 9.4 06/20/2016   HGB 13.5 06/20/2016   HCT 42.0 06/20/2016   MCV 89.7 06/20/2016   PLT 175 06/20/2016    Lab Results  Component Value Date   CREATININE 1.15 06/20/2016   BUN 21 (H) 06/20/2016   NA 135 06/20/2016   K 4.5 06/20/2016   CL 97 (L) 06/20/2016   CO2 29 06/20/2016    Lab Results  Component Value Date   ALT 12 06/03/2014   AST 17 06/03/2014   ALKPHOS 120 (H) 06/03/2014   BILITOT 0.5 06/03/2014   INR 3.0 06/19/2016     Assessment/Plan:  1) hypoxia - he did go up with rest but I am concerned with decreased air movement in right side and persistent hypoxia that he needs further intervention.  ?effusion, heart  failure.  No fever, no cough, congestion concerning for infeciton.  I have sent him to [facility] ED for further evaluation by ambulance.    2) possible osteomyelitis of left toe - I feel he really needs debridement of the toe and further vascular evaluation as he has very poor DM control and  I suspect his blood flow may not be adequate.  I feel he needs further vascular studies to see if salvageable or to consider amputation.  He has doppler studies scheduled this week, potentially can get them done if admitted, if appropriate or consideration of debridement.

## 2016-07-03 NOTE — ED Provider Notes (Deleted)
I saw and evaluated the patient, reviewed the resident's note and I agree with the findings and plan.   EKG Interpretation  Date/Time:  Thursday July 03 2016 15:08:27 EST Ventricular Rate:  64 PR Interval:    QRS Duration: 192 QT Interval:  491 QTC Calculation: 507 R Axis:   -80 Text Interpretation:  Ventricular-paced rhythm No further analysis attempted due to paced rhythm Baseline wander in lead(s) V3 No significant change since last tracing Confirmed by [facility]  MD, Loree Fee ([id number]) on 07/03/2016 11:22:20 PM      Hypoxia  Wound abscess - Plan: DG Foot Complete Left, DG Foot Complete Left  Congestive heart failure, unspecified congestive heart failure chronicity, unspecified congestive heart failure type [facility])  Patient is a 82 year old gentleman presenting today with gradual onset of worsening shortness of breath, dyspnea on exertion and hypoxia. Patient has a history of CHF as well as ongoing issues with a wound on his foot. He was at the ID office today and when walking and his oxygen was 79% on room air. Patient currently has no complaints however his wife states he is becoming progressively short of breath with increased girth of his abdomen. He is still taking his Lasix daily as prescribed and wife states his salt intake has not significantly changed. He denies any sleeping issues and denies any chest pain. On exam patient has decreased breath sounds on the right side normal heart sounds. Distention of the abdomen without pain and peripheral edema. He is awake alert and in no acute distress at this time. When walking to the bathroom off of oxygen patient dropped to 75% and required nasal cannula oxygen to get sats back in the 90s. Patient's imaging and labs are consistent with CHF exacerbation. Wife denies any recent cough, fever or infectious symptoms. White blood cell count is within normal limits and low suspicion for pneumonia at this time.   Blanchie Dessert, MD 07/03/16  [id number]

## 2016-07-03 NOTE — ED Notes (Signed)
Pt ambulated self to RR. O2 sat dropped to 78%. Returned pt to room and placed back on 2L [place]. O2 Sat currently at 92.

## 2016-07-04 ENCOUNTER — Inpatient Hospital Stay (HOSPITAL_COMMUNITY): Payer: Medicare Other

## 2016-07-04 ENCOUNTER — Inpatient Hospital Stay (HOSPITAL_COMMUNITY)
Admission: RE | Admit: 2016-07-04 | Discharge: 2016-07-04 | Disposition: A | Discharge status: Home / Self Care | Payer: Medicare Other | Source: Ambulatory Visit

## 2016-07-04 DIAGNOSIS — Z959 Presence of cardiac and vascular implant and graft, unspecified: Secondary | ICD-10-CM

## 2016-07-04 DIAGNOSIS — Z87891 Personal history of nicotine dependence: Secondary | ICD-10-CM

## 2016-07-04 DIAGNOSIS — IMO0002 Reserved for concepts with insufficient information to code with codable children: Secondary | ICD-10-CM

## 2016-07-04 DIAGNOSIS — I442 Atrioventricular block, complete: Secondary | ICD-10-CM

## 2016-07-04 DIAGNOSIS — E440 Moderate protein-calorie malnutrition: Secondary | ICD-10-CM

## 2016-07-04 DIAGNOSIS — I739 Peripheral vascular disease, unspecified: Secondary | ICD-10-CM

## 2016-07-04 DIAGNOSIS — E1151 Type 2 diabetes mellitus with diabetic peripheral angiopathy without gangrene: Secondary | ICD-10-CM

## 2016-07-04 DIAGNOSIS — M86172 Other acute osteomyelitis, left ankle and foot: Secondary | ICD-10-CM

## 2016-07-04 DIAGNOSIS — E1165 Type 2 diabetes mellitus with hyperglycemia: Secondary | ICD-10-CM

## 2016-07-04 DIAGNOSIS — J9601 Acute respiratory failure with hypoxia: Secondary | ICD-10-CM

## 2016-07-04 DIAGNOSIS — J90 Pleural effusion, not elsewhere classified: Secondary | ICD-10-CM

## 2016-07-04 DIAGNOSIS — G4733 Obstructive sleep apnea (adult) (pediatric): Secondary | ICD-10-CM

## 2016-07-04 LAB — COMPREHENSIVE METABOLIC PANEL WITH GFR
ALT: 25 U/L (ref 17–63)
AST: 29 U/L (ref 15–41)
Albumin: 2.5 g/dL — ABNORMAL LOW (ref 3.5–5.0)
Alkaline Phosphatase: 129 U/L — ABNORMAL HIGH (ref 38–126)
Anion gap: 10 (ref 5–15)
BUN: 19 mg/dL (ref 6–20)
CO2: 31 mmol/L (ref 22–32)
Calcium: 9.8 mg/dL (ref 8.9–10.3)
Chloride: 99 mmol/L — ABNORMAL LOW (ref 101–111)
Creatinine, Ser: 1.15 mg/dL (ref 0.61–1.24)
GFR calc Af Amer: >60 mL/min
GFR calc non Af Amer: 59 mL/min — ABNORMAL LOW
Glucose, Bld: 94 mg/dL (ref 65–99)
Potassium: 3.6 mmol/L (ref 3.5–5.1)
Sodium: 140 mmol/L (ref 135–145)
Total Bilirubin: 0.8 mg/dL (ref 0.3–1.2)
Total Protein: 6.8 g/dL (ref 6.5–8.1)

## 2016-07-04 LAB — BLOOD CULTURE ID PANEL (REFLEXED)
Acinetobacter baumannii: NOT DETECTED
CANDIDA TROPICALIS: NOT DETECTED
Candida albicans: NOT DETECTED
Candida glabrata: NOT DETECTED
Candida krusei: NOT DETECTED
Candida parapsilosis: NOT DETECTED
Enterobacter cloacae complex: NOT DETECTED
Enterobacteriaceae species: NOT DETECTED
Enterococcus species: NOT DETECTED
Escherichia coli: NOT DETECTED
HAEMOPHILUS INFLUENZAE: NOT DETECTED
KLEBSIELLA PNEUMONIAE: NOT DETECTED
Klebsiella oxytoca: NOT DETECTED
Listeria monocytogenes: NOT DETECTED
METHICILLIN RESISTANCE: DETECTED — AB
NEISSERIA MENINGITIDIS: NOT DETECTED
Proteus species: NOT DETECTED
Pseudomonas aeruginosa: NOT DETECTED
STAPHYLOCOCCUS AUREUS BCID: NOT DETECTED
STREPTOCOCCUS SPECIES: NOT DETECTED
Serratia marcescens: NOT DETECTED
Staphylococcus species: DETECTED — AB
Streptococcus agalactiae: NOT DETECTED
Streptococcus pneumoniae: NOT DETECTED
Streptococcus pyogenes: NOT DETECTED

## 2016-07-04 LAB — INFLUENZA PANEL BY PCR (TYPE A & B)
Influenza A By PCR: NEGATIVE
Influenza B By PCR: NEGATIVE

## 2016-07-04 LAB — BODY FLUID CELL COUNT WITH DIFFERENTIAL
LYMPHS FL: 37 %
Monocyte-Macrophage-Serous Fluid: 57 % (ref 50–90)
NEUTROPHIL FLUID: 6 % (ref 0–25)
WBC FLUID: 137 uL (ref 0–1000)

## 2016-07-04 LAB — GLUCOSE, CAPILLARY
GLUCOSE-CAPILLARY: 213 mg/dL — AB (ref 65–99)
GLUCOSE-CAPILLARY: 120 mg/dL — AB (ref 65–99)
GLUCOSE-CAPILLARY: 180 mg/dL — AB (ref 65–99)
Glucose-Capillary: 94 mg/dL (ref 65–99)
Glucose-Capillary: 41 mg/dL — CL (ref 65–99)

## 2016-07-04 LAB — GRAM STAIN

## 2016-07-04 LAB — PROTIME-INR
INR: 3.45
Prothrombin Time: 35.5 s — ABNORMAL HIGH (ref 11.4–15.2)

## 2016-07-04 LAB — LACTATE DEHYDROGENASE: LDH: 155 U/L (ref 98–192)

## 2016-07-04 LAB — PROTEIN, BODY FLUID: Total protein, fluid: <3 g/dL

## 2016-07-04 LAB — LACTATE DEHYDROGENASE, PLEURAL OR PERITONEAL FLUID: LD, Fluid: 53 U/L — ABNORMAL HIGH (ref 3–23)

## 2016-07-04 MED ORDER — WARFARIN - PHARMACIST DOSING INPATIENT
Freq: Every day | Status: DC
  Administered 2016-07-04: 23:00:00

## 2016-07-04 MED ORDER — VANCOMYCIN HCL 10 G IV SOLR
1750.0000 mg | INTRAVENOUS | Status: DC
  Administered 2016-07-04: 1750 mg via INTRAVENOUS
  Filled 2016-07-04: qty 1750

## 2016-07-04 MED ORDER — LIDOCAINE HCL (PF) 1 % IJ SOLN
INTRAMUSCULAR | Status: AC
  Filled 2016-07-04: qty 10

## 2016-07-04 MED ORDER — ADULT MULTIVITAMIN W/MINERALS CH
1.0000 | ORAL_TABLET | Freq: Every day | ORAL | Status: DC
  Administered 2016-07-04 – 2016-07-21 (×17): 1 via ORAL
  Filled 2016-07-04 (×17): qty 1

## 2016-07-04 MED ORDER — WARFARIN SODIUM 2 MG PO TABS
2.0000 mg | ORAL_TABLET | Freq: Once | ORAL | Status: AC
  Administered 2016-07-04: 2 mg via ORAL
  Filled 2016-07-04: qty 1

## 2016-07-04 MED ORDER — ENSURE ENLIVE PO LIQD
237.0000 mL | ORAL | Status: DC
  Administered 2016-07-04 – 2016-07-20 (×14): 237 mL via ORAL

## 2016-07-04 MED ORDER — COLLAGENASE 250 UNIT/GM EX OINT
TOPICAL_OINTMENT | Freq: Every day | CUTANEOUS | Status: DC
  Administered 2016-07-04 – 2016-07-05 (×2): via TOPICAL
  Administered 2016-07-06 – 2016-07-07 (×2): 1 via TOPICAL
  Administered 2016-07-08 – 2016-07-15 (×8): via TOPICAL
  Filled 2016-07-04: qty 30

## 2016-07-04 MED ORDER — PRO-STAT SUGAR FREE PO LIQD
30.0000 mL | Freq: Two times a day (BID) | ORAL | Status: DC
  Administered 2016-07-04 – 2016-07-07 (×8): 30 mL via ORAL
  Administered 2016-07-08: 21:00:00 via ORAL
  Administered 2016-07-08 – 2016-07-14 (×4): 30 mL via ORAL
  Filled 2016-07-04 (×18): qty 30

## 2016-07-04 NOTE — Procedures (Signed)
Ultrasound-guided diagnostic and therapeutic right thoracentesis performed yielding 1.8 liters of yellow colored fluid. No immediate complications. Follow-up chest x-ray pending. procedure terminated secondary to hypotension and almost complete resolution of effusion.  Helene Bernstein E 10:45 AM 07/04/2016

## 2016-07-04 NOTE — Evaluation (Signed)
Physical Therapy Evaluation Patient Details Name: [name]. MRN: [id number] DOB: [birth year] Today's Date: 07/04/2016   History of Present Illness  Pt adm with Acute hypoxemic respiratory failure: Probably secondary to acute diastolic heart failure or pleural effusion. PMH - chf, avr, cabg, afib, pacer, lt toe amputations, pvd.  Clinical Impression  Pt admitted with above diagnosis and presents to PT with functional limitations due to deficits listed below (See PT problem list). Pt needs skilled PT to maximize independence and safety to allow discharge to home with wife. Pt with some balance deficits and feel he could benefit from an assistive device but pt not receptive and may not remember to use at home.      Follow Up Recommendations Home health PT    Equipment Recommendations  Rolling walker with 5" wheels (possibly)    Recommendations for Other Services       Precautions / Restrictions Precautions Precautions: Fall Restrictions Weight Bearing Restrictions: No      Mobility  Bed Mobility Overal bed mobility: Modified Independent             General bed mobility comments: Incr time  Transfers Overall transfer level: Needs assistance Equipment used: None Transfers: Sit to/from Stand Sit to Stand: Min guard         General transfer comment: Assist for balance  Ambulation/Gait Ambulation/Gait assistance: Min assist Ambulation Distance (Feet): 240 Feet Assistive device: None Gait Pattern/deviations: Decreased step length - right;Decreased stride length;Drifts right/left;Staggering right;Narrow base of support     General Gait Details: Pt with unsteady gait but reluctant to use assistive device  Stairs            Wheelchair Mobility    Modified Rankin (Stroke Patients Only)       Balance Overall balance assessment: Needs assistance Sitting-balance support: No upper extremity supported;Feet supported Sitting balance-Leahy Scale: Good     Standing balance support: No upper extremity supported;During functional activity Standing balance-Leahy Scale: Fair Standing balance comment: Able to maintain static standing but needs assists for dynamic                             Pertinent Vitals/Pain Pain Assessment: No/denies pain    Home Living Family/patient expects to be discharged to:: Private residence Living Arrangements: Spouse/significant other Available Help at Discharge: Family;Available 24 hours/day Type of Home: House Home Access: Level entry     Home Layout: One level Home Equipment: Walker - 2 wheels;Shower seat      Prior Function Level of Independence: Independent               Hand Dominance        Extremity/Trunk Assessment   Upper Extremity Assessment Upper Extremity Assessment: Overall WFL for tasks assessed    Lower Extremity Assessment Lower Extremity Assessment: Generalized weakness       Communication   Communication: No difficulties  Cognition Arousal/Alertness: Awake/alert Behavior During Therapy: WFL for tasks assessed/performed Overall Cognitive Status: History of cognitive impairments - at baseline                 General Comments: Decr short term memory and safety awareness    General Comments      Exercises     Assessment/Plan    PT Assessment Patient needs continued PT services  PT Problem List Decreased strength;Decreased activity tolerance;Decreased balance;Decreased mobility;Decreased knowledge of use of DME  PT Treatment Interventions DME instruction;Gait training;Functional mobility training;Therapeutic activities;Therapeutic exercise;Balance training;Patient/family education    PT Goals (Current goals can be found in the Care Plan section)  Acute Rehab PT Goals Patient Stated Goal: go home PT Goal Formulation: With patient/family Time For Goal Achievement: 07/11/16 Potential to Achieve Goals: Good    Frequency Min  3X/week   Barriers to discharge        Co-evaluation               End of Session Equipment Utilized During Treatment: Gait belt;Oxygen Activity Tolerance: Patient tolerated treatment well Patient left: in bed;with call bell/phone within reach;with bed alarm set;with family/visitor present Nurse Communication: Mobility status         Time: [id number] PT Time Calculation (min) (ACUTE ONLY): 22 min   Charges:   PT Evaluation $PT Eval Moderate Complexity: 1 Procedure     PT G CodesShary Decamp [facility] 07-31-16, 10:47 AM Suanne Marker PT [phone]

## 2016-07-04 NOTE — Consult Note (Signed)
[place] Nurse wound consult note Reason for Consult: left foot wound. Followed by the wound care center. Recently dx with osteomyelitis in this area and was to follow up with ID MD, when he was found to be hypoxic and was admitted to the hospital.  Per wife at the bedside patient is also being worked up by [facility] Dr. Trula Slade for adequate circulation prior to any surgical interventions for the toe ulcer.   Wound type: Neuropathic foot ulcer; left medial great toe base Pressure Injury POA:No Measurement:1.2cmx 1.0cm x 0.5cm  Wound bed:100% yellow with exposed bone Drainage (amount, consistency, odor) moderate, yellow brown, with slight odor.  However patient's wife reports use of silver alginate which causes a fishy type odor Periwound: intact, no significant erythema, induration or edema Dressing procedure/placement/frequency:  Add enzymatic debridement ointment to the left foot wound, pack with saline moistened packing strip, cover with foam. Reapply Santyl and change packing daily.  Ok to keep foam in place for 5 days, just peel back to replace packing daily.  Discussed POC with patient and bedside nurse.  Re consult if needed, will not follow at this time. Thanks  Aronda Burford [employer], RN,CWOCN, CNS [phone])

## 2016-07-04 NOTE — Progress Notes (Signed)
ANTICOAGULATION CONSULT NOTE -follow up Pharmacy Consult for warfarin  Indication: atrial fibrillation  Allergies  Allergen Reactions  . Potassium-Containing Compounds Anaphylaxis    IV--loss of conciousness  . Metoprolol Succinate Other (See Comments)    Extremely tired    Patient Measurements: Height: 5\' 10"  (177.8 cm) Weight: 191 lb 11.2 oz (87 kg) (Scale B) IBW/kg (Calculated) : 73  Vital Signs: Temp: 98.3 F (36.8 C) (01/12 1139) Temp Source: Oral (01/12 1139) BP: 133/52 (01/12 1700) Pulse Rate: 64 (01/12 1139)  Labs:  Recent Labs  07/03/16 1540 07/04/16 0502  HGB 12.0*  --   HCT 37.3*  --   PLT 203  --   LABPROT 36.0* 35.5*  INR 3.51 3.45  CREATININE 1.19 1.15    Medical History: Past Medical History:  Diagnosis Date  . Aortal stenosis 2006   biophrostetic  . Arthritis   . Atrial fibrillation ([place])    onset after CABG--- coumadin management at Coumadin clinic  . Carotid artery disease ([place]) 09/27/2010   Carotid US 12/17: bilat ICA 40-59 >> 1 year FU  . CHF (congestive heart failure) ([place])    abter CABG  . Complete heart block ([place])   . Coronary artery disease   . DM type 2 (diabetes mellitus, type 2) ([place])    used to see Dr Meredith Pel, now f/u by Dr Larose Kells  . Erectile dysfunction   . Gangrene ([place])   . History of nephrolithiasis   . HTN (hypertension)   . Hyperlipidemia   . Memory loss   . Obesity   . OSA (obstructive sleep apnea)    not using CPAP  . Pacemaker 2/07   VVI  . PVD (peripheral vascular disease) ([name])    CT angio showed- vascular insuff, intestine and RAS bilaterally, andgiogram 02/2009 severe PVD medical management  . S/P aortic valve replacement   . Sick sinus syndrome ([name])    [name]  . Skin cancer    surgery (nose) 09-2010    Assessment: 82 yo male admitted with SOB. On warfarin prior to admission for history of AVR bioprosthetic, prior left atrial appendage thrombus and AFib. Pt has a right-sided pleural effusion.  Thoracentesis done today 1/12, continuing coumadin.  INR on admit 1/11 was high at 3.5, thus coumadin dose held. Today INR remains above goal with slight decline to 3.45.  No bleeding noted. No CBC today.  On cephalexin now- does not usually increase coumadin effect signiicantly. Marland Kitchen  PTA warfarin dose: 7.5 mg po qFridays, 5 mg po all other days  Goal of Therapy:  INR 2-3 (confirmed per Anti-coag visit note 06/19/16) Monitor platelets by anticoagulation protocol: Yes    Plan:   Resume coumadin today with lower dose of 2 mg x1 -Daily INR   Nicole Cella, RPh Clinical Pharmacist Pager: [phone] 8A-4P [phone] 4P-10P [phone] [place] [phone] 07/04/2016,6:03 PM

## 2016-07-04 NOTE — Progress Notes (Signed)
Initial Nutrition Assessment  DOCUMENTATION CODES:   Non-severe (moderate) malnutrition in context of chronic illness  INTERVENTION:  Ensure Enlive po once daily, each supplement provides 350 kcal and 20 grams of protein 30 ml Pro-Stat BID, each dose provides 100 kcal and 15 grams of protein Multivitamin with minerals  NUTRITION DIAGNOSIS:   Malnutrition related to chronic illness as evidenced by mild depletion of body fat, moderate depletions of muscle mass.   GOAL:   Patient will meet greater than or equal to 90% of their needs   MONITOR:   PO intake, Labs, Skin, Weight trends, Supplement acceptance  REASON FOR ASSESSMENT:   Malnutrition Screening Tool    ASSESSMENT:   82 y.o. male with medical history significant of severe AS status post redo aVR (bioprosthetic valve), chronic diastolic heart failure, CAD status post CABG, chronic atrial fibrillation on Coumadin, history of left atrial thrombus noted on TEE in 7/12, history of peripheral vascular disease, left foot ulcer with osteomyelitis on Keflex was referred from the infectious disease MDs office for evaluation of hypoxia. Chest x-ray in the emergency room showed right-sided pleural effusion, and underlying infiltrate/atelectasis.   Pt states that he has lost weight in the past year, but he doesn't know why he has lost weight. He reports having a good appetite and eating 3 meals most days. Per nursing notes, pt ate 100% of breakfast this morning. Pt had mild fat wasting and moderate muscle wasting per nutrition-focused physical exam. Per weight history, pt has lost 13% of his body weight in the past year, ~28 lbs. Pt has thoracentesis this morning yielding 1.8 L. Per nursing notes, pt has + 2 edema in both lower extremities. Fluid may be masking additional weight loss.   Labs: elevated CRP, low albumin  Diet Order:  Diet heart healthy/carb modified Room service appropriate? Yes; Fluid consistency: Thin  Skin:  Wound  (see comment) (Left foot wound on left amputated toe)  Last BM:  unknown  Height:   Ht Readings from Last 1 Encounters:  07/03/16 5\' 10"  (1.778 m)    Weight:   Wt Readings from Last 1 Encounters:  07/04/16 191 lb 11.2 oz (87 kg)    Ideal Body Weight:  75.45 kg  BMI:  Body mass index is 27.51 kg/m.  Estimated Nutritional Needs:   Kcal:  1900-2100  Protein:  95-105 grams  Fluid:  2.1 L/day  EDUCATION NEEDS:   No education needs identified at this time  [place], CSP, LDN Inpatient Clinical Dietitian Pager: [phone] After Hours Pager: [phone]

## 2016-07-04 NOTE — Progress Notes (Signed)
[place] services was arranged by previous CM with [facility]; Mindi Slicker [facility] [phone]

## 2016-07-04 NOTE — Progress Notes (Addendum)
PHARMACY - PHYSICIAN COMMUNICATION CRITICAL VALUE ALERT - BLOOD CULTURE IDENTIFICATION (BCID)  Results for orders placed or performed during the hospital encounter of 07/03/16  Blood Culture ID Panel (Reflexed) (Collected: 07/03/2016  9:40 PM)  Result Value Ref Range   Enterococcus species NOT DETECTED NOT DETECTED   Listeria monocytogenes NOT DETECTED NOT DETECTED   Staphylococcus species DETECTED (A) NOT DETECTED   Staphylococcus aureus NOT DETECTED NOT DETECTED   Methicillin resistance DETECTED (A) NOT DETECTED   Streptococcus species NOT DETECTED NOT DETECTED   Streptococcus agalactiae NOT DETECTED NOT DETECTED   Streptococcus pneumoniae NOT DETECTED NOT DETECTED   Streptococcus pyogenes NOT DETECTED NOT DETECTED   Acinetobacter baumannii NOT DETECTED NOT DETECTED   Enterobacteriaceae species NOT DETECTED NOT DETECTED   Enterobacter cloacae complex NOT DETECTED NOT DETECTED   Escherichia coli NOT DETECTED NOT DETECTED   Klebsiella oxytoca NOT DETECTED NOT DETECTED   Klebsiella pneumoniae NOT DETECTED NOT DETECTED   Proteus species NOT DETECTED NOT DETECTED   Serratia marcescens NOT DETECTED NOT DETECTED   Haemophilus influenzae NOT DETECTED NOT DETECTED   Neisseria meningitidis NOT DETECTED NOT DETECTED   Pseudomonas aeruginosa NOT DETECTED NOT DETECTED   Candida albicans NOT DETECTED NOT DETECTED   Candida glabrata NOT DETECTED NOT DETECTED   Candida krusei NOT DETECTED NOT DETECTED   Candida parapsilosis NOT DETECTED NOT DETECTED   Candida tropicalis NOT DETECTED NOT DETECTED    Name of physician (or Provider) Contacted: M. Lynch  Changes to prescribed antibiotics required:  Add vanc tonight and consult ID in AM  Patient has a history of bioprosthetic AVR and pacemaker, and he is currently on Keflex for diabetic osteomyelitis.  Although blood culture is 1 of 2, he may need further work-up.  Labs reviewed.   Plan: - Vanc 1750mg  IV Q24H - Monitor renal fxn, micro data,  vanc trough if indicated - F/U ID consult     Cathan Gearin D. Mina Marble, PharmD, BCPS Pager:  [phone] 07/04/2016, 9:30 PM

## 2016-07-04 NOTE — Progress Notes (Signed)
Hypoglycemic Event  CBG: 41  Treatment: 15 GM carbohydrate snack  Symptoms: Sweaty  Follow-up CBG: Time:2129 CBG Result:120  Possible Reasons for Event: mmedication  Comments/MD notified: protocol followed    Tristan Schroeder

## 2016-07-04 NOTE — Progress Notes (Signed)
PROGRESS NOTE    [name].  [id number] DOB: 1934-12-05 DOA: 07/03/2016  PCP: [name], MD   Brief Narrative:  [name]. is a 82 y.o. male with medical history significant of dementia, poorly controlled DM, severe AS status post redo aVR (bioprosthetic valve), chronic diastolic heart failure, CAD status post CABG, chronic valvular atrial fibrillation on Coumadin, history of left atrial thrombus noted on TEE in 7/12, history of peripheral vascular disease s/p PTCA of R tibial artery and Left SFA stent placement,  amputation of L great toe and subsequent left foot ulcer with osteomyelitis on Keflex was referred from the infectious disease MDs office for evaluation of hypoxia.  Subjective: Confused, no complaints. Per wife, he has been short of breath on exertion for 3 days now.   Assessment & Plan:   Principal Problem:   Acute diastolic CHF (congestive heart failure), acute hypoxic resp failure - cont IV lasix, aldactone, Lisinopril -  follow Cr, I and O, daily wieghts  Active Problems: Right Pleural effusion - likely due to above- thoracentesis today with removal of 1.8 L yellow fluid - per Light's criteria, fluid is transudative - no further work up needed    S/P AVR (aortic valve replacement) - bioprosthetic    PACEMAKER, PERMANENT/   Complete heart block     Chronic atrial fibrillation - CHA2DS2-VASc Score at least 6 - not on rate controlling agent- cont Coumadin- follow INR  H/o atrial thrombus - Coumadin  CAD s/p CABG - not on ASA- cont statin    Diabetic osteomyelitis  - cont Keflex and outpt f/u by ID - wound care consulted    Malnutrition of moderate degree - supplements ordered  Dementia - follow for behavioral disturbances   DVT prophylaxis: Coumadin Code Status: Full code Family Communication: wife Disposition Plan: home when stable Consultants:   IR Procedures:   Thoracentesis  Antimicrobials:  Anti-infectives    Start      Dose/Rate Route Frequency Ordered Stop   07/03/16 1930  cephALEXin (KEFLEX) capsule 500 mg     500 mg Oral Every 8 hours 07/03/16 1835         Objective: Vitals:   07/04/16 1030 07/04/16 1035 07/04/16 1041 07/04/16 1139  BP: (!) 110/50 (!) 108/38 (!) 93/46 (!) 104/43  Pulse:    64  Resp:    18  Temp:    98.3 F (36.8 C)  TempSrc:    Oral  SpO2:    90%  Weight:      Height:        Intake/Output Summary (Last 24 hours) at 07/04/16 1401 Last data filed at 07/04/16 1300  Gross per 24 hour  Intake              660 ml  Output             2250 ml  Net            -1590 ml   Filed Weights   07/03/16 1510 07/03/16 2052 07/04/16 0533  Weight: 89.4 kg (197 lb) 88.4 kg (194 lb 14.4 oz) 87 kg (191 lb 11.2 oz)    Examination: General exam: Appears comfortable  HEENT: PERRLA, oral mucosa moist, no sclera icterus or thrush Respiratory system: crackles in LLL- decreased BS in RLL- Respiratory effort normal. Cardiovascular system: S1 & S2 heard, RRR.  No murmurs  Gastrointestinal system: Abdomen soft, non-tender, nondistended. Normal bowel sound. No organomegaly Central nervous system: Alert and oriented. No  focal neurological deficits. Extremities: No cyanosis, clubbing or edema Skin: No rashes or ulcers Psychiatry:  Mood & affect appropriate.     Data Reviewed: I have personally reviewed following labs and imaging studies  CBC:  Recent Labs Lab 07/03/16 1540  WBC 8.3  NEUTROABS 5.9  HGB 12.0*  HCT 37.3*  MCV 87.1  PLT 123456   Basic Metabolic Panel:  Recent Labs Lab 07/03/16 1540 07/04/16 0502  NA 137 140  K 3.7 3.6  CL 100* 99*  CO2 29 31  GLUCOSE 168* 94  BUN 25* 19  CREATININE 1.19 1.15  CALCIUM 10.0 9.8   GFR: Estimated Creatinine Clearance: 53.8 mL/min (by C-G formula based on SCr of 1.15 mg/dL). Liver Function Tests:  Recent Labs Lab 07/04/16 0502  AST 29  ALT 25  ALKPHOS 129*  BILITOT 0.8  PROT 6.8  ALBUMIN 2.5*   No results for input(s):  LIPASE, AMYLASE in the last 168 hours. No results for input(s): AMMONIA in the last 168 hours. Coagulation Profile:  Recent Labs Lab 07/03/16 1540 07/04/16 0502  INR 3.51 3.45   Cardiac Enzymes: No results for input(s): CKTOTAL, CKMB, CKMBINDEX, TROPONINI in the last 168 hours. BNP (last 3 results) No results for input(s): PROBNP in the last 8760 hours. HbA1C: No results for input(s): HGBA1C in the last 72 hours. CBG:  Recent Labs Lab 07/03/16 2211 07/04/16 0548  GLUCAP 216* 94   Lipid Profile: No results for input(s): CHOL, HDL, LDLCALC, TRIG, CHOLHDL, LDLDIRECT in the last 72 hours. Thyroid Function Tests: No results for input(s): TSH, T4TOTAL, FREET4, T3FREE, THYROIDAB in the last 72 hours. Anemia Panel: No results for input(s): VITAMINB12, FOLATE, FERRITIN, TIBC, IRON, RETICCTPCT in the last 72 hours. Urine analysis:    Component Value Date/Time   COLORURINE YELLOW 01/14/2011 1149   APPEARANCEUR CLEAR 01/14/2011 1149   LABSPEC 1.011 01/14/2011 1149   PHURINE 7.0 01/14/2011 1149   GLUCOSEU NEGATIVE 01/14/2011 1149   HGBUR NEGATIVE 01/14/2011 [place] 01/14/2011 1149   KETONESUR NEGATIVE 01/14/2011 1149   PROTEINUR NEGATIVE 01/14/2011 1149   UROBILINOGEN 1.0 01/14/2011 1149   NITRITE NEGATIVE 01/14/2011 1149   LEUKOCYTESUR TRACE (A) 01/14/2011 1149   Sepsis Labs: @LABRCNTIP (procalcitonin:4,lacticidven:4) ) Recent Results (from the past 240 hour(s))  Gram stain     Status: None (Preliminary result)   Collection Time: 07/04/16 10:34 AM  Result Value Ref Range Status   Specimen Description FLUID PLEURAL RIGHT  Final   Special Requests NONE  Final   Gram Stain RARE WBC PRESENT, PREDOMINANTLY MONONUCLEAR  Final   Report Status PENDING  Incomplete         Radiology Studies: Dg Chest 1 View  Result Date: 07/04/2016 CLINICAL DATA:  Right-sided thoracentesis. EXAM: CHEST 1 VIEW COMPARISON:  07/04/2015 FINDINGS: Small right pleural  effusion. No right pneumothorax. Left lung is clear. Stable cardiomediastinal silhouette. Prior CABG. Single lead cardiac pacemaker. No acute osseous abnormality. IMPRESSION: 1. Small right pleural effusion. No right pneumothorax status post right thoracentesis. Electronically Signed   By: Kathreen Devoid   On: 07/04/2016 11:03   Dg Chest 2 View  Result Date: 07/03/2016 CLINICAL DATA:  Left foot wound EXAM: CHEST  2 VIEW COMPARISON:  06/20/2016 FINDINGS: Cardiac shadow remains enlarged. Pacing device is again seen and stable. Postsurgical changes are again noted and stable. Increasing right-sided effusion and underlying infiltrate/atelectasis is present. The left lung remains clear. No bony abnormality is seen. IMPRESSION: Increasing right-sided effusion and likely underlying infiltrate/atelectasis. Electronically  Signed   By: Inez Catalina M.D.   On: 07/03/2016 16:05   Dg Foot Complete Left  Result Date: 07/03/2016 CLINICAL DATA:  82 year old male with a history of possible persisting osteomyelitis EXAM: LEFT FOOT - COMPLETE 3+ VIEW COMPARISON:  MRI 06/20/2016 plain film 05/20/2016 FINDINGS: Re- demonstration of prior left toe amputation. Compare to the prior plain film there is increasing erosive changes at the distal first metatarsal. Overlying skin/soft tissue ulceration with gauze dressing in place. Dense calcifications of the pedal vessels and visualized tibial vessels. No displaced fracture. Degenerative changes of the forefoot and midfoot. IMPRESSION: Compared to the prior plain film there appears to be progressing erosive changes at dorsal aspect of the distal first metatarsal, compatible with persisting osteomyelitis which was demonstrated on prior MRI. Soft tissue ulceration along the medial forefoot. Dense calcifications of the tibial vessels and pedal vessels. Correlation with ankle-brachial index/noninvasive imaging may be useful to evaluate wound healing capability. Signed, Dulcy Fanny. Earleen Newport, DO  [facility] Electronically Signed   By: Corrie Mckusick D.O.   On: 07/03/2016 16:09   US Thoracentesis Asp Pleural Space W/img Guide  Result Date: 07/04/2016 INDICATION: Patient with history of congestive heart failure and recent chest x-ray revealing a right-sided pleural effusion. Request made for diagnostic and therapeutic thoracentesis. EXAM: ULTRASOUND GUIDED DIAGNOSTIC AND THERAPEUTIC THORACENTESIS MEDICATIONS: 1% lidocaine. COMPLICATIONS: None immediate. PROCEDURE: An ultrasound guided thoracentesis was thoroughly discussed with the patient and questions answered. The benefits, risks, alternatives and complications were also discussed. The patient understands and wishes to proceed with the procedure. Written consent was obtained. Ultrasound was performed to localize and mark an adequate pocket of fluid in the right chest. The area was then prepped and draped in the normal sterile fashion. 1% Lidocaine was used for local anesthesia. Under ultrasound guidance a Safe-T-Centesis catheter was introduced. Thoracentesis was performed. The catheter was removed and a dressing applied. FINDINGS: A total of approximately 1.8 L of clear yellow fluid was removed. Samples were sent to the laboratory as requested by the clinical team. IMPRESSION: Successful ultrasound guided right thoracentesis yielding 1.8 L of pleural fluid. The procedure was terminated secondary to hypotension. Read by: Saverio Danker, PA-C Electronically Signed   By: Sandi Mariscal M.D.   On: 07/04/2016 12:17      Scheduled Meds: . cephALEXin  500 mg Oral Q8H  . collagenase   Topical Daily  . donepezil  10 mg Oral QHS  . ezetimibe-simvastatin  1 tablet Oral QHS  . feeding supplement (ENSURE ENLIVE)  237 mL Oral Q24H  . feeding supplement (PRO-STAT SUGAR FREE 64)  30 mL Oral BID  . furosemide  60 mg Intravenous BID  . insulin aspart  0-15 Units Subcutaneous TID WC  . insulin aspart   15 Units Subcutaneous TID WC  . insulin glargine  15 Units Subcutaneous QHS  . lidocaine (PF)      . lisinopril  10 mg Oral Daily  . multivitamin with minerals  1 tablet Oral Daily  . sodium chloride flush  3 mL Intravenous Q12H  . spironolactone  25 mg Oral q1800   Continuous Infusions:   LOS: 1 day    Time spent in minutes: 35    [place], MD [facility] Pager: [email or web address] Password [email or web address] 07/04/2016, 2:01 PM

## 2016-07-04 NOTE — Progress Notes (Signed)
*  PRELIMINARY RESULTS* Vascular Ultrasound Lower Extremity Arterial Duplex has been completed.  Preliminary findings: Findings consistent with moderate atherosclerosis involving the left femoral artery stent, no obvious areas of stenosis but somewhat difficult study due to calcified vessels and shadowing.  Biphasic flow seen in the left common femoral artery, femoral stent and proximal popliteal artery. Monophasic flow seen in the distal popliteal, posterior tibial, peroneal, anterior tibial arteries, and dorsalis pedis.    Everrett Coombe 07/04/2016, 3:14 PM

## 2016-07-05 DIAGNOSIS — E1159 Type 2 diabetes mellitus with other circulatory complications: Secondary | ICD-10-CM

## 2016-07-05 DIAGNOSIS — I509 Heart failure, unspecified: Secondary | ICD-10-CM

## 2016-07-05 DIAGNOSIS — L304 Erythema intertrigo: Secondary | ICD-10-CM

## 2016-07-05 DIAGNOSIS — Z9889 Other specified postprocedural states: Secondary | ICD-10-CM

## 2016-07-05 DIAGNOSIS — M868X7 Other osteomyelitis, ankle and foot: Secondary | ICD-10-CM

## 2016-07-05 DIAGNOSIS — I70245 Atherosclerosis of native arteries of left leg with ulceration of other part of foot: Secondary | ICD-10-CM

## 2016-07-05 DIAGNOSIS — L97428 Non-pressure chronic ulcer of left heel and midfoot with other specified severity: Secondary | ICD-10-CM

## 2016-07-05 DIAGNOSIS — Z8049 Family history of malignant neoplasm of other genital organs: Secondary | ICD-10-CM

## 2016-07-05 LAB — BASIC METABOLIC PANEL
ANION GAP: 10 (ref 5–15)
Anion gap: 7 (ref 5–15)
BUN: 29 mg/dL — AB (ref 6–20)
BUN: 36 mg/dL — AB (ref 6–20)
CHLORIDE: 95 mmol/L — AB (ref 101–111)
CHLORIDE: 95 mmol/L — AB (ref 101–111)
CO2: 34 mmol/L — ABNORMAL HIGH (ref 22–32)
CO2: 32 mmol/L (ref 22–32)
Calcium: 9.8 mg/dL (ref 8.9–10.3)
Calcium: 9.9 mg/dL (ref 8.9–10.3)
Creatinine, Ser: 1.27 mg/dL — ABNORMAL HIGH (ref 0.61–1.24)
Creatinine, Ser: 1.25 mg/dL — ABNORMAL HIGH (ref 0.61–1.24)
GFR calc Af Amer: >60 mL/min (ref >60)
GFR calc Af Amer: >60 mL/min (ref >60)
GFR calc non Af Amer: 52 mL/min — ABNORMAL LOW (ref >60)
GFR, EST NON AFRICAN AMERICAN: 53 mL/min — AB (ref >60)
Glucose, Bld: 207 mg/dL — ABNORMAL HIGH (ref 65–99)
Glucose, Bld: 81 mg/dL (ref 65–99)
POTASSIUM: 3.8 mmol/L (ref 3.5–5.1)
POTASSIUM: 3.6 mmol/L (ref 3.5–5.1)
SODIUM: 136 mmol/L (ref 135–145)
SODIUM: 137 mmol/L (ref 135–145)

## 2016-07-05 LAB — GLUCOSE, CAPILLARY
GLUCOSE-CAPILLARY: 178 mg/dL — AB (ref 65–99)
GLUCOSE-CAPILLARY: 87 mg/dL (ref 65–99)
GLUCOSE-CAPILLARY: 91 mg/dL (ref 65–99)
Glucose-Capillary: 147 mg/dL — ABNORMAL HIGH (ref 65–99)

## 2016-07-05 LAB — PROTIME-INR
INR: 2.68
Prothrombin Time: 29.1 seconds — ABNORMAL HIGH (ref 11.4–15.2)

## 2016-07-05 LAB — CBC
HCT: 38.9 % — ABNORMAL LOW (ref 39.0–52.0)
HEMOGLOBIN: 12.5 g/dL — AB (ref 13.0–17.0)
MCH: 27.9 pg (ref 26.0–34.0)
MCHC: 32.1 g/dL (ref 30.0–36.0)
MCV: 86.8 fL (ref 78.0–100.0)
Platelets: 227 10*3/uL (ref 150–400)
RBC: 4.48 MIL/uL (ref 4.22–5.81)
RDW: 14 % (ref 11.5–15.5)
WBC: 11.4 10*3/uL — ABNORMAL HIGH (ref 4.0–10.5)

## 2016-07-05 MED ORDER — FUROSEMIDE 80 MG PO TABS
80.0000 mg | ORAL_TABLET | Freq: Every day | ORAL | Status: DC
Start: 2016-07-05 — End: 2016-07-06
  Administered 2016-07-06: 80 mg via ORAL
  Filled 2016-07-05: qty 1

## 2016-07-05 MED ORDER — FUROSEMIDE 80 MG PO TABS: 80.0000 mg | ORAL_TABLET | Freq: Every day | ORAL | Status: DC

## 2016-07-05 MED ORDER — FLUCONAZOLE 100 MG PO TABS
100.0000 mg | ORAL_TABLET | Freq: Every day | ORAL | Status: DC
  Administered 2016-07-05 – 2016-07-21 (×16): 100 mg via ORAL
  Filled 2016-07-05 (×17): qty 1

## 2016-07-05 MED ORDER — WARFARIN SODIUM 5 MG PO TABS: 5.0000 mg | ORAL_TABLET | Freq: Once | ORAL | Status: DC

## 2016-07-05 MED ORDER — FUROSEMIDE 40 MG PO TABS
40.0000 mg | ORAL_TABLET | Freq: Every day | ORAL | Status: DC
  Administered 2016-07-05 – 2016-07-07 (×3): 40 mg via ORAL
  Filled 2016-07-05 (×3): qty 1

## 2016-07-05 NOTE — Progress Notes (Signed)
CCMD called and said patient had 7 beats of vtach, checked on patient and patient was stable and "feeling great". EKG strip on monitor was re-labeled to V tachy.

## 2016-07-05 NOTE — Progress Notes (Signed)
Rounds with MD Pt is alert and oriented 2-3, weaned of 02 94% on Room and ambulating. with no distress, Denies pain. Surgeon with see Wound today on left foot per Attending MD.

## 2016-07-05 NOTE — Progress Notes (Signed)
Pt Wife and Niece- Ebony Hail from out of town Called for an update, Concerned about the plan for patient infection to Left foot   And if was getting  Iv antibiotics, Explain that he was given one Large dose and that it was best that spouse be updated and  Explain to family.

## 2016-07-05 NOTE — Consult Note (Signed)
Hospital Consult    Reason for Consult:  Left foot osteomyelitis Referring Physician:  Wynelle Cleveland MRN #:  [name]  History of Present Illness: This is a 82 y.o. male nonvascular surgery service having had previous left first toe amputation as well as left SFA stenting in 2014. At that time he had a peroneal artery in-line flow to his left foot anterior tibial artery occluded in the mid calf and reconstituted as a DP. He has been subsequently lost to follow-up but did recently see our nurse practitioner in the office with ABIs. That time he is scheduled for left lower extremity duplex but is now hospitalized with CHF exacerbation. He is on chronic Coumadin for atrial fibrillation. Her history obtained from his wife he has a wound now for a couple months and has been seen in the wound center on a weekly basis. He does have an MRI that demonstrated osteomyelitis but has not undergone any intervention for this other than local wound care. He does not have fevers although his wife does endorse very foul smell at home. He does not have complaints about either foot at this time.  Past Medical History:  Diagnosis Date  . Aortal stenosis 2006   biophrostetic  . Arthritis   . Atrial fibrillation ([place])    onset after CABG--- coumadin management at Coumadin clinic  . Carotid artery disease ([place]) 09/27/2010   Carotid US 12/17: bilat ICA 40-59 >> 1 year FU  . CHF (congestive heart failure) ([place])    abter CABG  . Complete heart block ([place])   . Coronary artery disease   . DM type 2 (diabetes mellitus, type 2) ([place])    used to see Dr Meredith Pel, now f/u by Dr Larose Kells  . Erectile dysfunction   . Gangrene ([place])   . History of nephrolithiasis   . HTN (hypertension)   . Hyperlipidemia   . Memory loss   . Obesity   . OSA (obstructive sleep apnea)    not using CPAP  . Pacemaker 2/07   VVI  . PVD (peripheral vascular disease) ([name])    CT angio showed- vascular insuff, intestine and RAS bilaterally, andgiogram  02/2009 severe PVD medical management  . S/P aortic valve replacement   . Sick sinus syndrome ([name])    [name]  . Skin cancer    surgery (nose) 09-2010    Past Surgical History:  Procedure Laterality Date  . ABDOMINAL AORTAGRAM N/A 11/10/2012   Procedure: ABDOMINAL Maxcine Ham;  Surgeon: Serafina Mitchell, MD;  Location: [facility] [place] CATH LAB;  Service: Cardiovascular;  Laterality: N/A;  . AMPUTATION  01/2010   great toe  . ANGIOPLASTY / STENTING FEMORAL  02/10/10   Left femoral  . AORTIC VALVE REPLACEMENT     biophrostetic for Ao Stenosis 2006  . AORTIC VALVE REPLACEMENT  01/17/2011   S/P redo median sternotomy, extracorporeal circulation, redo Aortic Valve Replacement using a 23-mm Edwards pericardial Magna-Ease valve, Dr Ulla Gallo  . CATARACT EXTRACTION, BILATERAL    . CORONARY ARTERY BYPASS GRAFT  2006  . EP IMPLANTABLE DEVICE N/A 06/06/2016   Procedure: PPM Generator Changeout;  Surgeon: Thompson Grayer, MD;  Location: [place] CV LAB;  Service: Cardiovascular;  Laterality: N/A;  . EYE SURGERY    . INCISE AND DRAIN ABCESS Right May 2014   right heel   . LOWER EXTREMITY ANGIOGRAM Left 11/23/2012   Procedure: LOWER EXTREMITY ANGIOGRAM;  Surgeon: Serafina Mitchell, MD;  Location: [facility] CATH LAB;  Service: Cardiovascular;  Laterality:  Left;  . PACEMAKER PLACEMENT  07/2005   Medtronic Sigma SR implanted by Dr Olevia Perches  . TONSILLECTOMY      Allergies  Allergen Reactions  . Potassium-Containing Compounds Anaphylaxis    IV--loss of conciousness  . Metoprolol Succinate Other (See Comments)    Extremely tired    Prior to Admission medications   Medication Sig Start Date End Date Taking? Authorizing Provider  cephALEXin (KEFLEX) 500 MG capsule take 1 capsule by mouth every 8 hours 06/24/16  Yes Historical Provider, MD  donepezil (ARICEPT) 10 MG tablet Take 1 tablet (10 mg total) by mouth at bedtime. 05/08/16  Yes Kathrynn Ducking, MD  ezetimibe-simvastatin (VYTORIN) 10-40 MG per tablet  Take 1 tablet by mouth at bedtime. 05/06/12  Yes Larey Dresser, MD  furosemide (LASIX) 80 MG tablet Take one tablet by mouth every morning and then one-half tablet by mouth every other day in the afternoon 03/28/16  Yes Larey Dresser, MD  insulin glargine (LANTUS) 100 UNIT/ML injection Inject 0.15 mLs (15 Units total) into the skin at bedtime. 06/07/14  Yes Belkys A Regalado, MD  Insulin Glulisine (APIDRA ) Inject 35 Units into the skin 3 (three) times daily with meals.    Yes Historical Provider, MD  lisinopril (PRINIVIL,ZESTRIL) 10 MG tablet Take 1 tablet (10 mg total) by mouth daily. 07/25/15  Yes Scott Joylene Draft, PA-C  spironolactone (ALDACTONE) 25 MG tablet Take 1 tablet (25 mg total) by mouth daily. 08/10/15  Yes Larey Dresser, MD  warfarin (COUMADIN) 5 MG tablet Take as directed by coumadin clinic Patient taking differently: Take 5 mg by mouth daily. Take as directed by coumadin clinic 09/05/15  Yes Larey Dresser, MD    Social History   Social History  . Marital status: Married    Spouse name: N/A  . Number of children: 2  . Years of education: N/A   Occupational History  . retired- long distance Administrator  Retired   Social History Main Topics  . Smoking status: Former Smoker    Types: Cigarettes    Quit date: 06/23/1988  . Smokeless tobacco: Former Systems developer  . Alcohol use No  . Drug use: No  . Sexual activity: Not Currently   Other Topics Concern  . Not on file   Social History Narrative   Diet: healthy most of the time--   2 step children      Patient drinks about 3-4 cups of caffeine daily.   Patient is right handed.               Family History  Problem Relation Age of Onset  . Heart attack Father 4  . Heart disease Father     Before age 37  . Hypertension Father   . Stroke Mother 48  . Cancer Sister     Cervical  . Heart attack Sister   . Heart disease Sister   . Diabetes Sister     2 sister w/ DM  . Heart attack Sister   . Heart attack Sister    . Heart disease Sister 23    AAA  and  Stomach Aneurysm  . AAA (abdominal aortic aneurysm) Sister   . Heart disease Sister     Before age 25  . Heart attack Sister 19  . Colon cancer Neg Hx   . Prostate cancer Neg Hx     ROS: [x]  Positive   [ ]  Negative   [ ]  All sytems reviewed and  are negative  Cardiovascular: []  chest pain/pressure []  palpitations []  SOB lying flat []  DOE []  pain in legs while walking []  pain in legs at rest []  pain in legs at night [x]  non-healing ulcers []  hx of DVT []  swelling in legs  Pulmonary: []  productive cough [x]  asthma/wheezing []  home O2  Neurologic: []  weakness in []  arms []  legs []  numbness in []  arms []  legs []  hx of CVA []  mini stroke [] difficulty speaking or slurred speech []  temporary loss of vision in one eye []  dizziness  Hematologic: []  hx of cancer []  bleeding problems []  problems with blood clotting easily  Endocrine:   []  diabetes []  thyroid disease  GI []  vomiting blood []  blood in stool  GU: []  CKD/renal failure []  HD--[]  M/W/F or []  T/T/S []  burning with urination []  blood in urine  Psychiatric: []  anxiety []  depression [x]  confusion  Musculoskeletal: []  arthritis []  joint pain  Integumentary: []  rashes [x]  ulcers  Constitutional: []  fever []  chills   Physical Examination  Vitals:   07/05/16 0534 07/05/16 1016  BP: 128/65 (!) 120/48  Pulse: (!) 59   Resp: 18   Temp: 97.5 F (36.4 C)    Body mass index is 27.32 kg/m.  General:  Alert and awake Gait: Not observed HENT: WNL, normocephalic Pulmonary: normal non-labored breathing Cardiac: bilateral palpable femoral and popliteal pulses Abdomen: soft, NT/ND, no masses Extremities: R groin with significant erythema and foul smell Left foot medial ulceration at 1st metatarsal head with packing Musculoskeletal: no muscle wasting or atrophy  Neurologic: A&O X 3; Appropriate Affect ; SENSATION: normal; MOTOR FUNCTION:  moving all  extremities equally. Speech is fluent/normal Psychiatric: oriented x 3, not agitated  CBC    Component Value Date/Time   WBC 11.4 (H) 07/05/2016 0613   RBC 4.48 07/05/2016 0613   HGB 12.5 (L) 07/05/2016 0613   HGB 14.1 11/22/2010 0953   HCT 38.9 (L) 07/05/2016 0613   HCT 42.6 11/22/2010 0953   PLT 227 07/05/2016 0613   PLT 223 11/22/2010 0953   MCV 86.8 07/05/2016 0613   MCV 82 11/22/2010 0953   MCH 27.9 07/05/2016 0613   MCHC 32.1 07/05/2016 0613   RDW 14.0 07/05/2016 0613   RDW 16.3 (H) 11/22/2010 0953   LYMPHSABS 1.3 07/03/2016 1540   LYMPHSABS 2.4 11/22/2010 0953   MONOABS 0.9 07/03/2016 1540   EOSABS 0.2 07/03/2016 1540   EOSABS 0.4 11/22/2010 0953   BASOSABS 0.0 07/03/2016 1540   BASOSABS 0.1 11/22/2010 0953    BMET    Component Value Date/Time   NA 136 07/05/2016 0613   K 3.8 07/05/2016 0613   CL 95 (L) 07/05/2016 0613   CO2 34 (H) 07/05/2016 0613   GLUCOSE 207 (H) 07/05/2016 0613   GLUCOSE 229 08/07/2008 0000   BUN 29 (H) 07/05/2016 0613   CREATININE 1.27 (H) 07/05/2016 0613   CREATININE 1.17 03/19/2016 1534   CALCIUM 9.8 07/05/2016 0613   GFRNONAA 52 (L) 07/05/2016 0613   GFRAA >60 07/05/2016 0613    COAGS: Lab Results  Component Value Date   INR 2.68 07/05/2016   INR 3.45 07/04/2016   INR 3.51 07/03/2016   PROTIME 16.2 12/05/2008     Non-Invasive Vascular Imaging:   Left leg duplex reviewed     Vascular Ultrasound Lower Extremity Arterial Duplex has been completed.  Preliminary findings: Findings consistent with moderate atherosclerosis involving the left femoral artery stent, no obvious areas of stenosis but somewhat difficult study due to calcified  vessels and shadowing.  Biphasic flow seen in the left common femoral artery, femoral stent and proximal popliteal artery. Monophasic flow seen in the distal popliteal, posterior tibial, peroneal, anterior tibial arteries, and dorsalis pedis.    MRI IMPRESSION: 1. Soft tissue ulceration medial  to the first metatarsal head status post remote resection of the great toe. There is abnormal underlying signal and enhancement within the distal half of the first metatarsal consistent with osteomyelitis. This may involve the tibial sesamoid as well. 2. The additional metatarsals, toes and visualized tarsal bones demonstrate no significant findings. 3. Nonspecific soft tissue findings with T2 hyperintensity and enhancement in the forefoot musculature, probably related to diabetic myopathy. No focal soft tissue abscess identified.   ASSESSMENT/PLAN: This is a 82 y.o. male known to our service with previous left SFA stenting and left first toe amputation. He now has an ulcer on the left medial aspect of his foot. It is noncompressible DP ABI that is greater than 1 but monophasic. Popliteal pulses are palpable bilaterally. I discussed with his wife the need for angiogram with possible intervention to improve blood flow to his left foot. He will also likely require further indication of that foot. Unfortunately this time his INR is elevated given his Coumadin and this needs to be held. He can be transitioned to heparin drip and Coumadin level reaches to. He also has a significant fungal infection in his right groin that would prohibit any intervention at this time. Nystatin powder was placed and he will need antifungals either IV or by mouth as well. We will continue to follow plan for angiogram when INR corrects and fungal infection is cleared.  Brandon C. Donzetta Matters, MD [facility] [place] Office: [phone] Pager: [phone]

## 2016-07-05 NOTE — Progress Notes (Signed)
PROGRESS NOTE    [name].  SG:[id number] DOB: [birth year] DOA: 07/03/2016  PCP: [name], MD   Brief Narrative:  Seth Percell. is a 82 y.o. male with medical history significant of dementia, poorly controlled DM, severe AS status post redo aVR (bioprosthetic valve), chronic diastolic heart failure, CAD status post CABG, chronic valvular atrial fibrillation on Coumadin, history of left atrial thrombus noted on TEE in 7/12, history of peripheral vascular disease s/p PTCA of R tibial artery and Left SFA stent placement,  amputation of L great toe and subsequent left foot ulcer with osteomyelitis on Keflex was referred from the infectious disease MDs office for evaluation of hypoxia.  Subjective: Confused, no complaints.   Assessment & Plan:   Principal Problem:   Acute diastolic CHF (congestive heart failure), acute hypoxic resp failure - started IV lasix- has diuresed well- pulse ox 97% on room air -switch Lasix to oral - cont aldactone, Lisinopril -  follow Cr, I and O, daily wieghts  Active Problems: Right Pleural effusion - likely due to above- thoracentesis 1/12 with removal of 1.8 L yellow fluid - per Light's criteria, fluid is transudative - no further work up needed    Diabetic osteomyelitis  -  Has been on Keflex but wound continues to be infected - will consult ID - vascular surgery consulted- will have Angiogram on Monday -  may need to broaden antibiotics or consider amputation - wound care consulted    S/P AVR (aortic valve replacement) - bioprosthetic    PACEMAKER, PERMANENT/   Complete heart block     Chronic atrial fibrillation - CHA2DS2-VASc Score at least 6 - not on rate controlling agent- hold Coumain for angiogram on Monday  H/o atrial thrombus - Coumadin- switch to Heparin when INR is subtherapeutic   CAD s/p CABG - not on ASA- cont statin    Malnutrition of moderate degree - supplements ordered  Dementia - follow for behavioral  disturbances   DVT prophylaxis: Coumadin Code Status: Full code Family Communication: wife Disposition Plan: home when stable Consultants:   IR Procedures:   Thoracentesis  Antimicrobials:  Anti-infectives    Start     Dose/Rate Route Frequency Ordered Stop   07/05/16 1330  fluconazole (DIFLUCAN) tablet 100 mg     100 mg Oral Daily 07/05/16 1231     07/04/16 2200  vancomycin (VANCOCIN) 1,750 mg in sodium chloride 0.9 % 500 mL IVPB  Status:  Discontinued     1,750 mg 250 mL/hr over 120 Minutes Intravenous Every 24 hours 07/04/16 2132 07/05/16 1231   07/03/16 1930  cephALEXin (KEFLEX) capsule 500 mg     500 mg Oral Every 8 hours 07/03/16 1835         Objective: Vitals:   07/05/16 0534 07/05/16 0824 07/05/16 1016 07/05/16 1225  BP: 128/65  (!) 120/48 (!) 111/43  Pulse: (!) 59   (!) 59  Resp: 18   20  Temp: 97.5 F (36.4 C)   97.8 F (36.6 C)  TempSrc: Oral   Oral  SpO2: 96% 94%  94%  Weight: 86.4 kg (190 lb 6.4 oz)     Height:        Intake/Output Summary (Last 24 hours) at 07/05/16 1251 Last data filed at 07/05/16 0915  Gross per 24 hour  Intake             2160 ml  Output  1000 ml  Net             1160 ml   Filed Weights   07/03/16 2052 07/04/16 0533 07/05/16 0534  Weight: 88.4 kg (194 lb 14.4 oz) 87 kg (191 lb 11.2 oz) 86.4 kg (190 lb 6.4 oz)    Examination: General exam: Appears comfortable  HEENT: PERRLA, oral mucosa moist, no sclera icterus or thrush Respiratory system: CTA b/l - Respiratory effort normal. Cardiovascular system: S1 & S2 heard, RRR.  No murmurs  Gastrointestinal system: Abdomen soft, non-tender, nondistended. Normal bowel sound. No organomegaly Central nervous system: Alert and oriented. No focal neurological deficits. Extremities: No cyanosis, clubbing or edema Skin: No rashes or ulcers Psychiatry:  Mood & affect appropriate.     Data Reviewed: I have personally reviewed following labs and imaging  studies  CBC:  Recent Labs Lab 07/03/16 1540 07/05/16 0613  WBC 8.3 11.4*  NEUTROABS 5.9  --   HGB 12.0* 12.5*  HCT 37.3* 38.9*  MCV 87.1 86.8  PLT 203 [id number]   Basic Metabolic Panel:  Recent Labs Lab 07/03/16 1540 07/04/16 0502 07/05/16 0613  NA 137 140 136  K 3.7 3.6 3.8  CL 100* 99* 95*  CO2 29 31 34*  GLUCOSE 168* 94 207*  BUN 25* 19 29*  CREATININE 1.19 1.15 1.27*  CALCIUM 10.0 9.8 9.8   GFR: Estimated Creatinine Clearance: 48.7 mL/min (by C-G formula based on SCr of 1.27 mg/dL (H)). Liver Function Tests:  Recent Labs Lab 07/04/16 0502  AST 29  ALT 25  ALKPHOS 129*  BILITOT 0.8  PROT 6.8  ALBUMIN 2.5*   No results for input(s): LIPASE, AMYLASE in the last 168 hours. No results for input(s): AMMONIA in the last 168 hours. Coagulation Profile:  Recent Labs Lab 07/03/16 1540 07/04/16 0502 07/05/16 0613  INR 3.51 3.45 2.68   Cardiac Enzymes: No results for input(s): CKTOTAL, CKMB, CKMBINDEX, TROPONINI in the last 168 hours. BNP (last 3 results) No results for input(s): PROBNP in the last 8760 hours. HbA1C: No results for input(s): HGBA1C in the last 72 hours. CBG:  Recent Labs Lab 07/04/16 2100 07/04/16 2129 07/04/16 2248 07/05/16 0557 07/05/16 1133  GLUCAP 41* 120* 180* 178* 87   Lipid Profile: No results for input(s): CHOL, HDL, LDLCALC, TRIG, CHOLHDL, LDLDIRECT in the last 72 hours. Thyroid Function Tests: No results for input(s): TSH, T4TOTAL, FREET4, T3FREE, THYROIDAB in the last 72 hours. Anemia Panel: No results for input(s): VITAMINB12, FOLATE, FERRITIN, TIBC, IRON, RETICCTPCT in the last 72 hours. Urine analysis:    Component Value Date/Time   COLORURINE YELLOW 01/14/2011 1149   APPEARANCEUR CLEAR 01/14/2011 1149   LABSPEC 1.011 01/14/2011 1149   PHURINE 7.0 01/14/2011 1149   GLUCOSEU NEGATIVE 01/14/2011 1149   HGBUR NEGATIVE 01/14/2011 [place] 01/14/2011 1149   KETONESUR NEGATIVE 01/14/2011 1149    PROTEINUR NEGATIVE 01/14/2011 1149   UROBILINOGEN 1.0 01/14/2011 1149   NITRITE NEGATIVE 01/14/2011 1149   LEUKOCYTESUR TRACE (A) 01/14/2011 1149   Sepsis Labs: @LABRCNTIP (procalcitonin:4,lacticidven:4) ) Recent Results (from the past 240 hour(s))  Culture, blood (routine x 2)     Status: None (Preliminary result)   Collection Time: 07/03/16  9:40 PM  Result Value Ref Range Status   Specimen Description BLOOD RIGHT ARM  Final   Special Requests BOTTLES DRAWN AEROBIC AND ANAEROBIC 5ML  Final   Culture  Setup Time   Final    GRAM POSITIVE COCCI IN CLUSTERS IN BOTH AEROBIC AND  ANAEROBIC BOTTLES CRITICAL RESULT CALLED TO, READ BACK BY AND VERIFIED WITH: T. DANG, [place], 07/04/16 2117 BY J. FUDESCO    Culture   Final    GRAM POSITIVE COCCI CULTURE REINCUBATED FOR BETTER GROWTH    Report Status PENDING  Incomplete  Blood Culture ID Panel (Reflexed)     Status: Abnormal   Collection Time: 07/03/16  9:40 PM  Result Value Ref Range Status   Enterococcus species NOT DETECTED NOT DETECTED Final   Listeria monocytogenes NOT DETECTED NOT DETECTED Final   Staphylococcus species DETECTED (A) NOT DETECTED Final    Comment: CRITICAL RESULT CALLED TO, READ BACK BY AND VERIFIED WITH: T. DANG PHARMD 07/04/16 2117 BY J FUDESCO    Staphylococcus aureus NOT DETECTED NOT DETECTED Final   Methicillin resistance DETECTED (A) NOT DETECTED Final    Comment: CRITICAL RESULT CALLED TO, READ BACK BY AND VERIFIED WITH: T. DANG PHARMD 07/04/16 2117 BY J. FUDESCO    Streptococcus species NOT DETECTED NOT DETECTED Final   Streptococcus agalactiae NOT DETECTED NOT DETECTED Final   Streptococcus pneumoniae NOT DETECTED NOT DETECTED Final   Streptococcus pyogenes NOT DETECTED NOT DETECTED Final   Acinetobacter baumannii NOT DETECTED NOT DETECTED Final   Enterobacteriaceae species NOT DETECTED NOT DETECTED Final   Enterobacter cloacae complex NOT DETECTED NOT DETECTED Final   Escherichia coli NOT DETECTED NOT  DETECTED Final   Klebsiella oxytoca NOT DETECTED NOT DETECTED Final   Klebsiella pneumoniae NOT DETECTED NOT DETECTED Final   Proteus species NOT DETECTED NOT DETECTED Final   Serratia marcescens NOT DETECTED NOT DETECTED Final   Haemophilus influenzae NOT DETECTED NOT DETECTED Final   Neisseria meningitidis NOT DETECTED NOT DETECTED Final   Pseudomonas aeruginosa NOT DETECTED NOT DETECTED Final   Candida albicans NOT DETECTED NOT DETECTED Final   Candida glabrata NOT DETECTED NOT DETECTED Final   Candida krusei NOT DETECTED NOT DETECTED Final   Candida parapsilosis NOT DETECTED NOT DETECTED Final   Candida tropicalis NOT DETECTED NOT DETECTED Final  Culture, blood (routine x 2)     Status: None (Preliminary result)   Collection Time: 07/03/16  9:46 PM  Result Value Ref Range Status   Specimen Description BLOOD LEFT ARM  Final   Special Requests BOTTLES DRAWN AEROBIC AND ANAEROBIC 5ML  Final   Culture NO GROWTH 1 DAY  Final   Report Status PENDING  Incomplete  Culture, body fluid-bottle     Status: None (Preliminary result)   Collection Time: 07/04/16 10:34 AM  Result Value Ref Range Status   Specimen Description FLUID PLEURAL RIGHT  Final   Special Requests NONE  Final   Culture NO GROWTH < 24 HOURS  Final   Report Status PENDING  Incomplete  Gram stain     Status: None   Collection Time: 07/04/16 10:34 AM  Result Value Ref Range Status   Specimen Description FLUID PLEURAL RIGHT  Final   Special Requests NONE  Final   Gram Stain   Final    RARE WBC PRESENT, PREDOMINANTLY MONONUCLEAR NO ORGANISMS SEEN    Report Status 07/04/2016 FINAL  Final         Radiology Studies: Dg Chest 1 View  Result Date: 07/04/2016 CLINICAL DATA:  Right-sided thoracentesis. EXAM: CHEST 1 VIEW COMPARISON:  07/04/2015 FINDINGS: Small right pleural effusion. No right pneumothorax. Left lung is clear. Stable cardiomediastinal silhouette. Prior CABG. Single lead cardiac pacemaker. No acute osseous  abnormality. IMPRESSION: 1. Small right pleural effusion. No right pneumothorax status post  right thoracentesis. Electronically Signed   By: Kathreen Devoid   On: 07/04/2016 11:03   Dg Chest 2 View  Result Date: 07/03/2016 CLINICAL DATA:  Left foot wound EXAM: CHEST  2 VIEW COMPARISON:  06/20/2016 FINDINGS: Cardiac shadow remains enlarged. Pacing device is again seen and stable. Postsurgical changes are again noted and stable. Increasing right-sided effusion and underlying infiltrate/atelectasis is present. The left lung remains clear. No bony abnormality is seen. IMPRESSION: Increasing right-sided effusion and likely underlying infiltrate/atelectasis. Electronically Signed   By: Inez Catalina M.D.   On: 07/03/2016 16:05   Dg Foot Complete Left  Result Date: 07/03/2016 CLINICAL DATA:  82 year old male with a history of possible persisting osteomyelitis EXAM: LEFT FOOT - COMPLETE 3+ VIEW COMPARISON:  MRI 06/20/2016 plain film 05/20/2016 FINDINGS: Re- demonstration of prior left toe amputation. Compare to the prior plain film there is increasing erosive changes at the distal first metatarsal. Overlying skin/soft tissue ulceration with gauze dressing in place. Dense calcifications of the pedal vessels and visualized tibial vessels. No displaced fracture. Degenerative changes of the forefoot and midfoot. IMPRESSION: Compared to the prior plain film there appears to be progressing erosive changes at dorsal aspect of the distal first metatarsal, compatible with persisting osteomyelitis which was demonstrated on prior MRI. Soft tissue ulceration along the medial forefoot. Dense calcifications of the tibial vessels and pedal vessels. Correlation with ankle-brachial index/noninvasive imaging may be useful to evaluate wound healing capability. Signed, Dulcy Fanny. Earleen Newport, DO [facility] Radiology Electronically Signed   By: Corrie Mckusick D.O.   On: 07/03/2016 16:09   US  Thoracentesis Asp Pleural Space W/img Guide  Result Date: 07/04/2016 INDICATION: Patient with history of congestive heart failure and recent chest x-ray revealing a right-sided pleural effusion. Request made for diagnostic and therapeutic thoracentesis. EXAM: ULTRASOUND GUIDED DIAGNOSTIC AND THERAPEUTIC THORACENTESIS MEDICATIONS: 1% lidocaine. COMPLICATIONS: None immediate. PROCEDURE: An ultrasound guided thoracentesis was thoroughly discussed with the patient and questions answered. The benefits, risks, alternatives and complications were also discussed. The patient understands and wishes to proceed with the procedure. Written consent was obtained. Ultrasound was performed to localize and mark an adequate pocket of fluid in the right chest. The area was then prepped and draped in the normal sterile fashion. 1% Lidocaine was used for local anesthesia. Under ultrasound guidance a Safe-T-Centesis catheter was introduced. Thoracentesis was performed. The catheter was removed and a dressing applied. FINDINGS: A total of approximately 1.8 L of clear yellow fluid was removed. Samples were sent to the laboratory as requested by the clinical team. IMPRESSION: Successful ultrasound guided right thoracentesis yielding 1.8 L of pleural fluid. The procedure was terminated secondary to hypotension. Read by: Saverio Danker, PA-C Electronically Signed   By: Sandi Mariscal M.D.   On: 07/04/2016 12:17      Scheduled Meds: . cephALEXin  500 mg Oral Q8H  . collagenase   Topical Daily  . donepezil  10 mg Oral QHS  . ezetimibe-simvastatin  1 tablet Oral QHS  . feeding supplement (ENSURE ENLIVE)  237 mL Oral Q24H  . feeding supplement (PRO-STAT SUGAR FREE 64)  30 mL Oral BID  . fluconazole  100 mg Oral Daily  . insulin aspart  0-15 Units Subcutaneous TID WC  . insulin aspart  15 Units Subcutaneous TID WC  . insulin glargine  15 Units Subcutaneous QHS  . lisinopril  10 mg Oral Daily  . multivitamin with minerals  1 tablet  Oral Daily  . sodium chloride  flush  3 mL Intravenous Q12H  . spironolactone  25 mg Oral q1800  . warfarin  5 mg Oral ONCE-1800  . Warfarin - Pharmacist Dosing Inpatient   Does not apply q1800   Continuous Infusions:   LOS: 2 days    Time spent in minutes: 72    [place], MD [facility] Pager: [email or web address] Password [facility] 07/05/2016, 12:51 PM

## 2016-07-05 NOTE — Consult Note (Addendum)
Date of Admission:  07/03/2016  Date of Consult:  07/05/2016  Reason for Consult: progressive infection with diabetic foot infection with  osteomyelitis in patient with PVD, DM, and AVR and a PACEMAKER   Referring Physician: Dr. Wynelle Cleveland   HPI: [name]. is an 82 y.o. male who was evaluated by my partner Dr. Linus Salmons on 07/03/2016 for a consult regarding osteomyelitis involving his left foot.  He has had chronic left foot  infection.  He has a history of PVD, poorly controlled diabetes    Relatively recently an ulcer that has developed at the amputation site over the last 3-4 months.  This has been poorly healing despite antibiotics and wound care by Dr. Con Memos.  He had a bone biopsy done that showed soft tissue inflammation and no definitive acute osteomyelitis.  He also had an MRI that noted enhancement of the distal half c/w osteomyelitis.  He saw Vinnie Level Nickel of Dr. Trula Slade office and was going for doppler studies.     when he came to see Dr. Linus Salmons he was hypoxic and was admitted the hospital for workup of his hypoxia which is found to be due to congestive heart failure. Dr. Linus Salmons also felt the patient needed further workup with regards to his osteomyelitis in his left foot. Patient was admitted blood cultures were drawn in one of 2 blood cultures is growing a coag negative staph. The patient has been continued on Keflex that he was on prior to seeing Dr. Linus Salmons. I'm told that while he has been here as had worsening of the wound with foul-smelling purulent drainage.  He has been seen by vascular surgery would like to perform a catheterization to evaluate his blood flow but they do not want to at this point time due to intertrigo at the groin site as well as an elevated INR. They have started nystatin and I have added fluconazole to that.  OF NOTE HE HAS AN AVR.   Past Medical History:  Diagnosis Date  . Aortal stenosis 2006   biophrostetic  . Arthritis   . Atrial  fibrillation ([place])    onset after CABG--- coumadin management at [facility]mad[facility] clinic  . Carotid artery disease ([place]) 09/27/2010   Carotid US 12/17: bilat ICA 40-59 >> 1 year FU  . CHF (congestive heart failure) ([place])    abter CABG  . Complete heart block ([place])   . Coronary artery disease   . DM type 2 (diabetes mellitus, type 2) ([place])    used to see Dr Meredith Pel, now f/u by Dr Larose Kells  . Erectile dysfunction   . Gangrene ([place])   . History of nephrolithiasis   . HTN (hypertension)   . Hyperlipidemia   . Memory loss   . Obesity   . OSA (obstructive sleep apnea)    not using CPAP  . Pacemaker 2/07   VVI  . PVD (peripheral vascular disease) ([name])    CT angio showed- vascular insuff, intestine and RAS bilaterally, andgiogram 02/2009 severe PVD medical management  . S/P aortic valve replacement   . Sick sinus syndrome ([name])    [name]  . Skin cancer    surgery (nose) 09-2010    Past Surgical History:  Procedure Laterality Date  . ABDOMINAL AORTAGRAM N/A 11/10/2012   Procedure: ABDOMINAL Maxcine Ham;  Surgeon: Serafina Mitchell, MD;  Location: [facility] CATH LAB;  Service: Cardiovascular;  Laterality: N/A;  . AMPUTATION  01/2010   great toe  .  ANGIOPLASTY / STENTING FEMORAL  02/10/10   Left femoral  . AORTIC VALVE REPLACEMENT     biophrostetic for Ao Stenosis 2006  . AORTIC VALVE REPLACEMENT  01/17/2011   S/P redo median sternotomy, extracorporeal circulation, redo Aortic Valve Replacement using a 23-mm Edwards pericardial Magna-Ease valve, Dr Ulla Gallo  . CATARACT EXTRACTION, BILATERAL    . CORONARY ARTERY BYPASS GRAFT  2006  . EP IMPLANTABLE DEVICE N/A 06/06/2016   Procedure: PPM Generator Changeout;  Surgeon: Thompson Grayer, MD;  Location: [place] CV LAB;  Service: Cardiovascular;  Laterality: N/A;  . EYE SURGERY    . INCISE AND DRAIN ABCESS Right May 2014   right heel   . LOWER EXTREMITY ANGIOGRAM Left 11/23/2012   Procedure: LOWER EXTREMITY ANGIOGRAM;  Surgeon: Serafina Mitchell,  MD;  Location[facility] CATH LAB;  Service: Cardiovascular;  Laterality: Left;  . PACEMAKER PLACEMENT  07/2005   Medtronic Sigma SR implanted by Dr Olevia Perches  . TONSILLECTOMY      Social History:  reports that he quit smoking about 28 years ago. His smoking use included Cigarettes. He has quit using smokeless tobacco. He reports that he does not drink alcohol or use drugs.   Family History  Problem Relation Age of Onset  . Heart attack Father 82  . Heart disease Father     Before age 63  . Hypertension Father   . Stroke Mother 70  . Cancer Sister     Cervical  . Heart attack Sister   . Heart disease Sister   . Diabetes Sister     2 sister w/ DM  . Heart attack Sister   . Heart attack Sister   . Heart disease Sister 48    AAA  and  Stomach Aneurysm  . AAA (abdominal aortic aneurysm) Sister   . Heart disease Sister     Before age 45  . Heart attack Sister 86  . Colon cancer Neg Hx   . Prostate cancer Neg Hx     Allergies  Allergen Reactions  . Potassium-Containing Compounds Anaphylaxis    IV--loss of conciousness  . Metoprolol Succinate Other (See Comments)    Extremely tired     Medications: I have reviewed patients current medications as documented in Epic Anti-infectives    Start     Dose/Rate Route Frequency Ordered Stop   07/05/16 1330  fluconazole (DIFLUCAN) tablet 100 mg     100 mg Oral Daily 07/05/16 1231     07/04/16 2200  vancomycin (VANCOCIN) 1,750 mg in sodium chloride 0.9 % 500 mL IVPB  Status:  Discontinued     1,750 mg 250 mL/hr over 120 Minutes Intravenous Every 24 hours 07/04/16 2132 07/05/16 1231   07/03/16 1930  cephALEXin (KEFLEX) capsule 500 mg     500 mg Oral Every 8 hours 07/03/16 1835           ROS:  as in HPI otherwise remainder of 12 point Review of Systems is not obtainable due to patient's confusion   Blood pressure (!) 111/43, pulse (!) 59, temperature 97.8 F (36.6 C), temperature source Oral, resp. rate 20, height 5' 10" (1.778 m),  weight 190 lb 6.4 oz (86.4 kg), SpO2 94 %. General: Alert and awake, oriented person, place, pale HEENT: anicteric sclera,  EOMI, oropharynx clear and without exudate Cardiovascular: bradycardic,  no murmur rubs or gallops Pulmonary: clear to auscultation bilaterally, no wheezing, rales or rhonchi Gastrointestinal: soft nontender, nondistended, normal bowel sounds, Musculoskeletal/skin   Left  foot 07/05/16:     Right foot:    Feet cool and pulse weak  Neuro: nonfocal   Results for orders placed or performed during the hospital encounter of 07/03/16 (from the past 48 hour(s))  Basic metabolic panel     Status: Abnormal   Collection Time: 07/03/16  3:40 PM  Result Value Ref Range   Sodium 137 135 - 145 mmol/L   Potassium 3.7 3.5 - 5.1 mmol/L   Chloride 100 (L) 101 - 111 mmol/L   CO2 29 22 - 32 mmol/L   Glucose, Bld 168 (H) 65 - 99 mg/dL   BUN 25 (H) 6 - 20 mg/dL   Creatinine, Ser 1.19 0.61 - 1.24 mg/dL   Calcium 10.0 8.9 - 10.3 mg/dL   GFR calc non Af Amer 56 (L) >60 mL/min   GFR calc Af Amer >60 >60 mL/min    Comment: (NOTE) The eGFR has been calculated using the CKD EPI equation. This calculation has not been validated in all clinical situations. eGFR's persistently <60 mL/min signify possible Chronic Kidney Disease.    Anion gap 8 5 - 15  CBC WITH DIFFERENTIAL     Status: Abnormal   Collection Time: 07/03/16  3:40 PM  Result Value Ref Range   WBC 8.3 4.0 - 10.5 K/uL   RBC 4.28 4.22 - 5.81 MIL/uL   Hemoglobin 12.0 (L) 13.0 - 17.0 g/dL   HCT 37.3 (L) 39.0 - 52.0 %   MCV 87.1 78.0 - 100.0 fL   MCH 28.0 26.0 - 34.0 pg   MCHC 32.2 30.0 - 36.0 g/dL   RDW 14.3 11.5 - 15.5 %   Platelets 203 150 - 400 K/uL   Neutrophils Relative % 70 %   Neutro Abs 5.9 1.7 - 7.7 K/uL   Lymphocytes Relative 15 %   Lymphs Abs 1.3 0.7 - 4.0 K/uL   Monocytes Relative 11 %   Monocytes Absolute 0.9 0.1 - 1.0 K/uL   Eosinophils Relative 3 %   Eosinophils Absolute 0.2 0.0 - 0.7 K/uL    Basophils Relative 1 %   Basophils Absolute 0.0 0.0 - 0.1 K/uL  Brain natriuretic peptide     Status: Abnormal   Collection Time: 07/03/16  3:40 PM  Result Value Ref Range   B Natriuretic Peptide 178.7 (H) 0.0 - 100.0 pg/mL  C-reactive protein     Status: Abnormal   Collection Time: 07/03/16  3:40 PM  Result Value Ref Range   CRP 4.2 (H) <1.0 mg/dL  Sedimentation rate     Status: Abnormal   Collection Time: 07/03/16  3:40 PM  Result Value Ref Range   Sed Rate 95 (H) 0 - 16 mm/hr  Protime-INR     Status: Abnormal   Collection Time: 07/03/16  3:40 PM  Result Value Ref Range   Prothrombin Time 36.0 (H) 11.4 - 15.2 seconds   INR 3.51   I-stat troponin, ED  (not at [facility])     Status: None   Collection Time: 07/03/16  3:50 PM  Result Value Ref Range   Troponin i, poc 0.04 0.00 - 0.08 ng/mL   Comment 3            Comment: Due to the release kinetics of cTnI, a negative result within the first hours of the onset of symptoms does not rule out myocardial infarction with certainty. If myocardial infarction is still suspected, repeat the test at appropriate intervals.   Culture, blood (routine x 2)  Status: None (Preliminary result)   Collection Time: 07/03/16  9:40 PM  Result Value Ref Range   Specimen Description BLOOD RIGHT ARM    Special Requests BOTTLES DRAWN AEROBIC AND ANAEROBIC 5ML    Culture  Setup Time      GRAM POSITIVE COCCI IN CLUSTERS IN BOTH AEROBIC AND ANAEROBIC BOTTLES CRITICAL RESULT CALLED TO, READ BACK BY AND VERIFIED WITH: T. DANG, PHARMD, 07/04/16 2117 BY J. FUDESCO    Culture      GRAM POSITIVE COCCI CULTURE REINCUBATED FOR BETTER GROWTH    Report Status PENDING   Blood Culture ID Panel (Reflexed)     Status: Abnormal   Collection Time: 07/03/16  9:40 PM  Result Value Ref Range   Enterococcus species NOT DETECTED NOT DETECTED   Listeria monocytogenes NOT DETECTED NOT DETECTED   Staphylococcus species DETECTED (A) NOT DETECTED    Comment: CRITICAL  RESULT CALLED TO, READ BACK BY AND VERIFIED WITH: T. DANG PHARMD 07/04/16 2117 BY J FUDESCO    Staphylococcus aureus NOT DETECTED NOT DETECTED   Methicillin resistance DETECTED (A) NOT DETECTED    Comment: CRITICAL RESULT CALLED TO, READ BACK BY AND VERIFIED WITH: T. DANG PHARMD 07/04/16 2117 BY J. FUDESCO    Streptococcus species NOT DETECTED NOT DETECTED   Streptococcus agalactiae NOT DETECTED NOT DETECTED   Streptococcus pneumoniae NOT DETECTED NOT DETECTED   Streptococcus pyogenes NOT DETECTED NOT DETECTED   Acinetobacter baumannii NOT DETECTED NOT DETECTED   Enterobacteriaceae species NOT DETECTED NOT DETECTED   Enterobacter cloacae complex NOT DETECTED NOT DETECTED   Escherichia coli NOT DETECTED NOT DETECTED   Klebsiella oxytoca NOT DETECTED NOT DETECTED   Klebsiella pneumoniae NOT DETECTED NOT DETECTED   Proteus species NOT DETECTED NOT DETECTED   Serratia marcescens NOT DETECTED NOT DETECTED   Haemophilus influenzae NOT DETECTED NOT DETECTED   Neisseria meningitidis NOT DETECTED NOT DETECTED   Pseudomonas aeruginosa NOT DETECTED NOT DETECTED   Candida albicans NOT DETECTED NOT DETECTED   Candida glabrata NOT DETECTED NOT DETECTED   Candida krusei NOT DETECTED NOT DETECTED   Candida parapsilosis NOT DETECTED NOT DETECTED   Candida tropicalis NOT DETECTED NOT DETECTED  Culture, blood (routine x 2)     Status: None (Preliminary result)   Collection Time: 07/03/16  9:46 PM  Result Value Ref Range   Specimen Description BLOOD LEFT ARM    Special Requests BOTTLES DRAWN AEROBIC AND ANAEROBIC 5ML    Culture NO GROWTH 1 DAY    Report Status PENDING   Glucose, capillary     Status: Abnormal   Collection Time: 07/03/16 10:11 PM  Result Value Ref Range   Glucose-Capillary 216 (H) 65 - 99 mg/dL   Comment 1 Notify RN    Comment 2 Document in Chart   Influenza panel by PCR (type A & B, H1N1)     Status: None   Collection Time: 07/03/16 11:43 PM  Result Value Ref Range   Influenza  A By PCR NEGATIVE NEGATIVE   Influenza B By PCR NEGATIVE NEGATIVE    Comment: (NOTE) The Xpert Xpress Flu assay is intended as an aid in the diagnosis of  influenza and should not be used as a sole basis for treatment.  This  assay is FDA approved for nasopharyngeal swab specimens only. Nasal  washings and aspirates are unacceptable for Xpert Xpress Flu testing.   Lactate dehydrogenase     Status: None   Collection Time: 07/04/16  5:02 AM  Result Value  Ref Range   LDH 155 98 - 192 U/L  Comprehensive metabolic panel     Status: Abnormal   Collection Time: 07/04/16  5:02 AM  Result Value Ref Range   Sodium 140 135 - 145 mmol/L   Potassium 3.6 3.5 - 5.1 mmol/L   Chloride 99 (L) 101 - 111 mmol/L   CO2 31 22 - 32 mmol/L   Glucose, Bld 94 65 - 99 mg/dL   BUN 19 6 - 20 mg/dL   Creatinine, Ser 1.15 0.61 - 1.24 mg/dL   Calcium 9.8 8.9 - 10.3 mg/dL   Total Protein 6.8 6.5 - 8.1 g/dL   Albumin 2.5 (L) 3.5 - 5.0 g/dL   AST 29 15 - 41 U/L   ALT 25 17 - 63 U/L   Alkaline Phosphatase 129 (H) 38 - 126 U/L   Total Bilirubin 0.8 0.3 - 1.2 mg/dL   GFR calc non Af Amer 59 (L) >60 mL/min   GFR calc Af Amer >60 >60 mL/min    Comment: (NOTE) The eGFR has been calculated using the CKD EPI equation. This calculation has not been validated in all clinical situations. eGFR's persistently <60 mL/min signify possible Chronic Kidney Disease.    Anion gap 10 5 - 15  Protime-INR     Status: Abnormal   Collection Time: 07/04/16  5:02 AM  Result Value Ref Range   Prothrombin Time 35.5 (H) 11.4 - 15.2 seconds   INR 3.45   Glucose, capillary     Status: None   Collection Time: 07/04/16  5:48 AM  Result Value Ref Range   Glucose-Capillary 94 65 - 99 mg/dL  Lactate dehydrogenase (CSF, pleural or peritoneal fluid)     Status: Abnormal   Collection Time: 07/04/16 10:31 AM  Result Value Ref Range   LD, Fluid 53 (H) 3 - 23 U/L    Comment: (NOTE) Results should be evaluated in conjunction with serum  values    Fluid Type-FLDH Pleural R   Protein, pleural or peritoneal fluid     Status: None   Collection Time: 07/04/16 10:31 AM  Result Value Ref Range   Total protein, fluid <3.0 g/dL    Comment: (NOTE) No normal range established for this test Results should be evaluated in conjunction with serum values    Fluid Type-FTP Pleural R   Body fluid cell count with differential     Status: Abnormal   Collection Time: 07/04/16 10:31 AM  Result Value Ref Range   Fluid Type-FCT Pleural R    Color, Fluid YELLOW (A) YELLOW   Appearance, Fluid CLEAR CLEAR   WBC, Fluid 137 0 - 1,000 cu mm   Neutrophil Count, Fluid 6 0 - 25 %   Lymphs, Fluid 37 %   Monocyte-Macrophage-Serous Fluid 57 50 - 90 %   Other Cells, Fluid RARE MESOTHELIAL CELLS PRESENT %  Culture, body fluid-bottle     Status: None (Preliminary result)   Collection Time: 07/04/16 10:34 AM  Result Value Ref Range   Specimen Description FLUID PLEURAL RIGHT    Special Requests NONE    Culture NO GROWTH < 24 HOURS    Report Status PENDING   Gram stain     Status: None   Collection Time: 07/04/16 10:34 AM  Result Value Ref Range   Specimen Description FLUID PLEURAL RIGHT    Special Requests NONE    Gram Stain      RARE WBC PRESENT, PREDOMINANTLY MONONUCLEAR NO ORGANISMS SEEN    Report  Status 07/04/2016 FINAL   Glucose, capillary     Status: Abnormal   Collection Time: 07/04/16  4:18 PM  Result Value Ref Range   Glucose-Capillary 213 (H) 65 - 99 mg/dL  Glucose, capillary     Status: Abnormal   Collection Time: 07/04/16  9:00 PM  Result Value Ref Range   Glucose-Capillary 41 (LL) 65 - 99 mg/dL   Comment 1 Notify RN    Comment 2 Document in Chart   Glucose, capillary     Status: Abnormal   Collection Time: 07/04/16  9:29 PM  Result Value Ref Range   Glucose-Capillary 120 (H) 65 - 99 mg/dL  Glucose, capillary     Status: Abnormal   Collection Time: 07/04/16 10:48 PM  Result Value Ref Range   Glucose-Capillary 180 (H) 65  - 99 mg/dL  Glucose, capillary     Status: Abnormal   Collection Time: 07/05/16  5:57 AM  Result Value Ref Range   Glucose-Capillary 178 (H) 65 - 99 mg/dL  Basic metabolic panel     Status: Abnormal   Collection Time: 07/05/16  6:13 AM  Result Value Ref Range   Sodium 136 135 - 145 mmol/L   Potassium 3.8 3.5 - 5.1 mmol/L   Chloride 95 (L) 101 - 111 mmol/L   CO2 34 (H) 22 - 32 mmol/L   Glucose, Bld 207 (H) 65 - 99 mg/dL   BUN 29 (H) 6 - 20 mg/dL   Creatinine, Ser 1.27 (H) 0.61 - 1.24 mg/dL   Calcium 9.8 8.9 - 10.3 mg/dL   GFR calc non Af Amer 52 (L) >60 mL/min   GFR calc Af Amer >60 >60 mL/min    Comment: (NOTE) The eGFR has been calculated using the CKD EPI equation. This calculation has not been validated in all clinical situations. eGFR's persistently <60 mL/min signify possible Chronic Kidney Disease.    Anion gap 7 5 - 15  Protime-INR     Status: Abnormal   Collection Time: 07/05/16  6:13 AM  Result Value Ref Range   Prothrombin Time 29.1 (H) 11.4 - 15.2 seconds   INR 2.68   CBC     Status: Abnormal   Collection Time: 07/05/16  6:13 AM  Result Value Ref Range   WBC 11.4 (H) 4.0 - 10.5 K/uL   RBC 4.48 4.22 - 5.81 MIL/uL   Hemoglobin 12.5 (L) 13.0 - 17.0 g/dL   HCT 38.9 (L) 39.0 - 52.0 %   MCV 86.8 78.0 - 100.0 fL   MCH 27.9 26.0 - 34.0 pg   MCHC 32.1 30.0 - 36.0 g/dL   RDW 14.0 11.5 - 15.5 %   Platelets 227 150 - 400 K/uL  Glucose, capillary     Status: None   Collection Time: 07/05/16 11:33 AM  Result Value Ref Range   Glucose-Capillary 87 65 - 99 mg/dL   Comment 1 Notify RN    *Note: Due to a large number of results and/or encounters for the requested time period, some results have not been displayed. A complete set of results can be found in Results Review.   _0 (sdes,specrequest,cult,reptstatus)   ) Recent Results (from the past 720 hour(s))  Surgical pcr screen     Status: None   Collection Time: 06/06/16  1:49 PM  Result Value Ref Range  Status   MRSA, PCR NEGATIVE NEGATIVE Final   Staphylococcus aureus NEGATIVE NEGATIVE Final    Comment:        The Xpert SA Assay (  FDA approved for NASAL specimens in patients over 72 years of age), is one component of a comprehensive surveillance program.  Test performance has been validated by [facility] for patients greater than or equal to 33 year old. It is not intended to diagnose infection nor to guide or monitor treatment.   Culture, blood (routine x 2)     Status: None (Preliminary result)   Collection Time: 07/03/16  9:40 PM  Result Value Ref Range Status   Specimen Description BLOOD RIGHT ARM  Final   Special Requests BOTTLES DRAWN AEROBIC AND ANAEROBIC 5ML  Final   Culture  Setup Time   Final    GRAM POSITIVE COCCI IN CLUSTERS IN BOTH AEROBIC AND ANAEROBIC BOTTLES CRITICAL RESULT CALLED TO, READ BACK BY AND VERIFIED WITH: T. DANG, [place], 07/04/16 2117 BY J. FUDESCO    Culture   Final    GRAM POSITIVE COCCI CULTURE REINCUBATED FOR BETTER GROWTH    Report Status PENDING  Incomplete  Blood Culture ID Panel (Reflexed)     Status: Abnormal   Collection Time: 07/03/16  9:40 PM  Result Value Ref Range Status   Enterococcus species NOT DETECTED NOT DETECTED Final   Listeria monocytogenes NOT DETECTED NOT DETECTED Final   Staphylococcus species DETECTED (A) NOT DETECTED Final    Comment: CRITICAL RESULT CALLED TO, READ BACK BY AND VERIFIED WITH: T. DANG PHARMD 07/04/16 2117 BY J FUDESCO    Staphylococcus aureus NOT DETECTED NOT DETECTED Final   Methicillin resistance DETECTED (A) NOT DETECTED Final    Comment: CRITICAL RESULT CALLED TO, READ BACK BY AND VERIFIED WITH: T. DANG PHARMD 07/04/16 2117 BY J. FUDESCO    Streptococcus species NOT DETECTED NOT DETECTED Final   Streptococcus agalactiae NOT DETECTED NOT DETECTED Final   Streptococcus pneumoniae NOT DETECTED NOT DETECTED Final   Streptococcus pyogenes NOT DETECTED NOT DETECTED Final   Acinetobacter baumannii NOT  DETECTED NOT DETECTED Final   Enterobacteriaceae species NOT DETECTED NOT DETECTED Final   Enterobacter cloacae complex NOT DETECTED NOT DETECTED Final   Escherichia coli NOT DETECTED NOT DETECTED Final   Klebsiella oxytoca NOT DETECTED NOT DETECTED Final   Klebsiella pneumoniae NOT DETECTED NOT DETECTED Final   Proteus species NOT DETECTED NOT DETECTED Final   Serratia marcescens NOT DETECTED NOT DETECTED Final   Haemophilus influenzae NOT DETECTED NOT DETECTED Final   Neisseria meningitidis NOT DETECTED NOT DETECTED Final   Pseudomonas aeruginosa NOT DETECTED NOT DETECTED Final   Candida albicans NOT DETECTED NOT DETECTED Final   Candida glabrata NOT DETECTED NOT DETECTED Final   Candida krusei NOT DETECTED NOT DETECTED Final   Candida parapsilosis NOT DETECTED NOT DETECTED Final   Candida tropicalis NOT DETECTED NOT DETECTED Final  Culture, blood (routine x 2)     Status: None (Preliminary result)   Collection Time: 07/03/16  9:46 PM  Result Value Ref Range Status   Specimen Description BLOOD LEFT ARM  Final   Special Requests BOTTLES DRAWN AEROBIC AND ANAEROBIC 5ML  Final   Culture NO GROWTH 1 DAY  Final   Report Status PENDING  Incomplete  Culture, body fluid-bottle     Status: None (Preliminary result)   Collection Time: 07/04/16 10:34 AM  Result Value Ref Range Status   Specimen Description FLUID PLEURAL RIGHT  Final   Special Requests NONE  Final   Culture NO GROWTH < 24 HOURS  Final   Report Status PENDING  Incomplete  Gram stain     Status: None  Collection Time: 07/04/16 10:34 AM  Result Value Ref Range Status   Specimen Description FLUID PLEURAL RIGHT  Final   Special Requests NONE  Final   Gram Stain   Final    RARE WBC PRESENT, PREDOMINANTLY MONONUCLEAR NO ORGANISMS SEEN    Report Status 07/04/2016 FINAL  Final     Impression/Recommendation  Principal Problem:   Acute diastolic CHF (congestive heart failure) ([name]) Active Problems:   Obstructive sleep  apnea   S/P AVR (aortic valve replacement)   PACEMAKER, PERMANENT   Aortic valve stenosis, severe prosthetic   Chronic atrial fibrillation ([name])   Complete heart block ([name])   Diabetic osteomyelitis ([name])   Hypoxia   Malnutrition of moderate degree   Pleural effusion   Skylier Kretschmer. is a 82 y.o. male with  Dementia poorly controlled diabetes mellitus and peripheral vascular disease, congestive heart failure and valvular heart disease status post aortic valve replacement and is a diabetic foot ulcer that is developed and progressed osteomyelitis and failed conservative therapies by wound care.  #1 Diabetic foot ulcer with osteomyelitis in context of PVD:  If patient desires aggressive care and in particular wants to avoid dental sequelae of uncontrolled osteomyelitis that would include recurrent bacteremia sepsis septic shock and infection of his artificial heart valve  And PACE MAKER I would favor aggressive surgical management.  If his pacemaker gets infected this will be a clear-cut disaster, which will require removal of the pacer potentially temporary pacemaker.  I agree that we first need better evaluation of his vascular system but I would come down strongly on the side of aggressive surgical cure of this infection.  I will discontinue his antibiotics at this point time however because he is not with a true bacteremia and is not systemically ill from his osteomyelitis at this point in time. This will allow [place] to increase yield on cultures done from bone in the operating room which I think he is going to need (at minimum some form of debridement but more likely an amputation potentially midfoot.)  #2 coag negative staph in blood this is likely contaminant since his only growing from one culture  #3 intertrigo I will add systemic fluconazole to the topical nystatin    07/05/2016, 2:49 PM   Thank you so much for this interesting consult  [place] [facility] [place] [phone] (pager) [phone] (office) 07/05/2016, 2:49 PM  [place] 07/05/2016, 2:49 PM

## 2016-07-05 NOTE — Progress Notes (Signed)
ANTICOAGULATION CONSULT NOTE - follow up Pharmacy Consult for warfarin  Indication: atrial fibrillation  Allergies  Allergen Reactions  . Potassium-Containing Compounds Anaphylaxis    IV--loss of conciousness  . Metoprolol Succinate Other (See Comments)    Extremely tired    Patient Measurements: Height: 5\' 10"  (177.8 cm) Weight: 190 lb 6.4 oz (86.4 kg) (Scale B) IBW/kg (Calculated) : 73  Vital Signs: Temp: 97.5 F (36.4 C) (01/13 0534) Temp Source: Oral (01/13 0534) BP: 128/65 (01/13 0534) Pulse Rate: 59 (01/13 0534)  Labs:  Recent Labs  07/03/16 1540 07/04/16 0502 07/05/16 0613  HGB 12.0*  --  12.5*  HCT 37.3*  --  38.9*  PLT 203  --  227  LABPROT 36.0* 35.5* 29.1*  INR 3.51 3.45 2.68  CREATININE 1.19 1.15 1.27*    Medical History: Past Medical History:  Diagnosis Date  . Aortal stenosis 2006   biophrostetic  . Arthritis   . Atrial fibrillation ([place])    onset after CABG--- coumadin management at [facility] clinic  . Carotid artery disease ([place]) 09/27/2010   Carotid US 12/17: bilat ICA 40-59 >> 1 year FU  . CHF (congestive heart failure) ([place])    abter CABG  . Complete heart block ([place])   . Coronary artery disease   . DM type 2 (diabetes mellitus, type 2) ([place])    used to see Dr Meredith Pel, now f/u by Dr Larose Kells  . Erectile dysfunction   . Gangrene ([place])   . History of nephrolithiasis   . HTN (hypertension)   . Hyperlipidemia   . Memory loss   . Obesity   . OSA (obstructive sleep apnea)    not using CPAP  . Pacemaker 2/07   VVI  . PVD (peripheral vascular disease) ([name])    CT angio showed- vascular insuff, intestine and RAS bilaterally, andgiogram 02/2009 severe PVD medical management  . S/P aortic valve replacement   . Sick sinus syndrome ([name])    [name]  . Skin cancer    surgery (nose) 09-2010    Assessment: 82 yo M admitted with SOB, to continue on PTA warfarin for h/o AVR bioprosthetic, prior left atrial appendage thrombus and AFib.  INR therapeutic today at 2.68, CBC wnl stable, no bleeding noted. Marland Kitchen  PTA warfarin dose: 7.5 mg po qFridays, 5 mg po all other days  Goal of Therapy:  INR 2-3 (confirmed per Anti-coag visit note 06/19/16) Monitor platelets by anticoagulation protocol: Yes    Plan:  Resume home regimen - give 5 mg PO tonight Daily INR, monitor s/sx of bleeding   Gwenlyn Perking, PharmD PGY1 Pharmacy Resident Pager: [phone] 07/05/2016 10:09 AM

## 2016-07-05 NOTE — Progress Notes (Signed)
Changed band aid on right, mid back from thoracentesis puncture.

## 2016-07-05 NOTE — Progress Notes (Signed)
CCMD called to notify of another 7 beat run of vtach, patient is stable, HR within normal limits. MD notified. Will continue to monitor.

## 2016-07-06 LAB — BASIC METABOLIC PANEL
ANION GAP: 9 (ref 5–15)
BUN: 44 mg/dL — ABNORMAL HIGH (ref 6–20)
CO2: 33 mmol/L — ABNORMAL HIGH (ref 22–32)
Calcium: 10 mg/dL (ref 8.9–10.3)
Chloride: 92 mmol/L — ABNORMAL LOW (ref 101–111)
Creatinine, Ser: 1.31 mg/dL — ABNORMAL HIGH (ref 0.61–1.24)
GFR calc Af Amer: 58 mL/min — ABNORMAL LOW (ref >60)
GFR calc non Af Amer: 50 mL/min — ABNORMAL LOW (ref >60)
GLUCOSE: 114 mg/dL — AB (ref 65–99)
Potassium: 3.9 mmol/L (ref 3.5–5.1)
Sodium: 134 mmol/L — ABNORMAL LOW (ref 135–145)

## 2016-07-06 LAB — PREALBUMIN: Prealbumin: 9.4 mg/dL — ABNORMAL LOW (ref 18–38)

## 2016-07-06 LAB — CBC WITH DIFFERENTIAL/PLATELET
Basophils Absolute: 0 10*3/uL (ref 0.0–0.1)
Basophils Relative: 0 %
EOS PCT: 2 %
Eosinophils Absolute: 0.2 10*3/uL (ref 0.0–0.7)
HEMATOCRIT: 37.6 % — AB (ref 39.0–52.0)
Hemoglobin: 12.3 g/dL — ABNORMAL LOW (ref 13.0–17.0)
Lymphocytes Relative: 16 %
Lymphs Abs: 2.1 10*3/uL (ref 0.7–4.0)
MCH: 28.7 pg (ref 26.0–34.0)
MCHC: 32.7 g/dL (ref 30.0–36.0)
MCV: 87.9 fL (ref 78.0–100.0)
MONO ABS: 1 10*3/uL (ref 0.1–1.0)
Monocytes Relative: 8 %
NEUTROS ABS: 9.6 10*3/uL — AB (ref 1.7–7.7)
Neutrophils Relative %: 74 %
Platelets: 228 10*3/uL (ref 150–400)
RBC: 4.28 MIL/uL (ref 4.22–5.81)
RDW: 14.3 % (ref 11.5–15.5)
WBC: 12.9 10*3/uL — ABNORMAL HIGH (ref 4.0–10.5)

## 2016-07-06 LAB — GLUCOSE, CAPILLARY
Glucose-Capillary: 116 mg/dL — ABNORMAL HIGH (ref 65–99)
Glucose-Capillary: 93 mg/dL (ref 65–99)
Glucose-Capillary: 109 mg/dL — ABNORMAL HIGH (ref 65–99)
Glucose-Capillary: 127 mg/dL — ABNORMAL HIGH (ref 65–99)

## 2016-07-06 LAB — CULTURE, BLOOD (ROUTINE X 2)

## 2016-07-06 LAB — PH, BODY FLUID: PH, BODY FLUID: 7.7

## 2016-07-06 LAB — C-REACTIVE PROTEIN: CRP: 4.5 mg/dL — AB (ref <1.0)

## 2016-07-06 LAB — SEDIMENTATION RATE: SED RATE: 100 mm/h — AB (ref 0–16)

## 2016-07-06 LAB — PROTIME-INR
INR: 2.26
Prothrombin Time: 25.3 seconds — ABNORMAL HIGH (ref 11.4–15.2)

## 2016-07-06 LAB — HIV ANTIBODY (ROUTINE TESTING W REFLEX): HIV SCREEN 4TH GENERATION: NONREACTIVE

## 2016-07-06 MED ORDER — NYSTATIN 100000 UNIT/GM EX POWD
Freq: Three times a day (TID) | CUTANEOUS | Status: DC
  Administered 2016-07-06 (×3): via TOPICAL
  Administered 2016-07-07: 1 via TOPICAL
  Administered 2016-07-07: 22:00:00 via TOPICAL
  Administered 2016-07-07: 1 via TOPICAL
  Administered 2016-07-08 – 2016-07-21 (×39): via TOPICAL
  Filled 2016-07-06: qty 15

## 2016-07-06 MED ORDER — FUROSEMIDE 80 MG PO TABS
80.0000 mg | ORAL_TABLET | Freq: Every day | ORAL | Status: DC
  Administered 2016-07-07 – 2016-07-09 (×3): 80 mg via ORAL
  Filled 2016-07-06 (×3): qty 1

## 2016-07-06 NOTE — Consult Note (Signed)
[facility] Nurse wound consult note Reason for Consult: Asked to reassess wound.  Chronic non-healing left foot full thickness wound at medial aspect (site of previous amputation). Followed by [facility] (Dr. Donzetta Matters saw today) and outpatient wound care center.  Patient will have further amputation after establishing improved blood flow.  Unfortunately her has a significant fungal overgrowth in the right inguinal area that will need to be resolved prior to catheterization.  Oral diflucan was initiated today. Wound type: Neuropathic, PAD, osteomyelitis (infectious) Pressure Injury POA: No Measurement:2cm x 1cm x 0.5cm Wound bed: 80% pink, 20% obscured by the presence of white non-viable tissue. Drainage (amount, consistency, odor) Small amount light yellow exudate Periwound: Mild erythema Dressing procedure/placement/frequency: Wound much cleaner than WOC Nurse visit on Friday, 07/04/16. Will discontinue collagenase and continue with saline moistened gauze changed daily until surgery. [facility] nursing team will not follow, but will remain available to this patient, the nursing and medical teams.  Please re-consult if needed. Thanks, Maudie Flakes, MSN, RN, [place], Arther Abbott  Pager# [phone]

## 2016-07-06 NOTE — Progress Notes (Signed)
  Progress Note    07/06/2016 8:52 AM * No surgery found *  Subjective:  No complaints  Vitals:   07/05/16 2135 07/06/16 0545  BP: (!) 112/41 (!) 123/51  Pulse:  66  Resp: 18 18  Temp: 98.3 F (36.8 C) 98.1 F (36.7 C)    Physical Exam: Awake and alert Right groin erythema with some improvement, foul smell still evident Left foot dressing cdi Palpable popliteal pulses bilaterally  CBC    Component Value Date/Time   WBC 12.9 (H) 07/06/2016 0433   RBC 4.28 07/06/2016 0433   HGB 12.3 (L) 07/06/2016 0433   HGB 14.1 11/22/2010 0953   HCT 37.6 (L) 07/06/2016 0433   HCT 42.6 11/22/2010 0953   PLT 228 07/06/2016 0433   PLT 223 11/22/2010 0953   MCV 87.9 07/06/2016 0433   MCV 82 11/22/2010 0953   MCH 28.7 07/06/2016 0433   MCHC 32.7 07/06/2016 0433   RDW 14.3 07/06/2016 0433   RDW 16.3 (H) 11/22/2010 0953   LYMPHSABS 2.1 07/06/2016 0433   LYMPHSABS 2.4 11/22/2010 0953   MONOABS 1.0 07/06/2016 0433   EOSABS 0.2 07/06/2016 0433   EOSABS 0.4 11/22/2010 0953   BASOSABS 0.0 07/06/2016 0433   BASOSABS 0.1 11/22/2010 0953    BMET    Component Value Date/Time   NA 134 (L) 07/06/2016 0433   K 3.9 07/06/2016 0433   CL 92 (L) 07/06/2016 0433   CO2 33 (H) 07/06/2016 0433   GLUCOSE 114 (H) 07/06/2016 0433   GLUCOSE 229 08/07/2008 0000   BUN 44 (H) 07/06/2016 0433   CREATININE 1.31 (H) 07/06/2016 0433   CREATININE 1.17 03/19/2016 1534   CALCIUM 10.0 07/06/2016 0433   GFRNONAA 50 (L) 07/06/2016 0433   GFRAA 58 (L) 07/06/2016 0433    INR    Component Value Date/Time   INR 2.26 07/06/2016 0433   INR 1.7 09/02/2010 1323     Intake/Output Summary (Last 24 hours) at 07/06/16 0852 Last data filed at 07/06/16 0548  Gross per 24 hour  Intake             1200 ml  Output              910 ml  Net              290 ml     Assessment/Plan: 82 y.o. male is here with osteo of his left foot and will need further amputation after establishing improved blood flow.  Unfortunately he has significant right groin rash at this time that needs to clear prior to catheterization. INR needs to continue to come down as well and will plan angiogram at first possible time when these have corrected.      Jaleal Schliep C. Donzetta Matters, MD [facility] Office: [phone] Pager: [phone]  07/06/2016 8:52 AM

## 2016-07-06 NOTE — Progress Notes (Signed)
PROGRESS NOTE    Seth Pacific.  SG:[id number] DOB: 1935-01-29 DOA: 07/03/2016  PCP: [name], MD   Brief Narrative:  Seth Mandia. is a 82 y.o. male with medical history significant of dementia, poorly controlled DM, severe AS status post redo aVR (bioprosthetic valve), chronic diastolic heart failure, CAD status post CABG, chronic valvular atrial fibrillation on Coumadin, history of left atrial thrombus noted on TEE in 7/12, history of peripheral vascular disease s/p PTCA of R tibial artery and Left SFA stent placement,  amputation of L great toe and subsequent left foot ulcer with osteomyelitis on Keflex was referred from the infectious disease MDs office for evaluation of hypoxia.  Subjective: Confused, no complaints.   Assessment & Plan:   Principal Problem:   Acute diastolic CHF (congestive heart failure), acute hypoxic resp failure - started IV lasix- has diuresed well- pulse ox 97% on room air -switched Lasix to oral - cont aldactone, Lisinopril -  Following  Cr, I and O, daily wieghts  Active Problems: Right Pleural effusion - likely due to above- thoracentesis 1/12 with removal of 1.8 L yellow fluid - per Light's criteria, fluid is transudative - no further work up needed    Diabetic osteomyelitis  -  Has been on Keflex but wound continues to be infected   - vascular surgery consulted- will have Angiogram on Monday - ID has discontinued antibiotics for now - wound care consulted    Chronic atrial fibrillation - CHA2DS2-VASc Score at least 6 - not on rate controlling agent- hold [place] for angiogram   Candida dermatitis of groin - Fluconazole started by ID- add Nystatin powder- cannot have angio until this clears  H/o atrial thrombus - Coumadin- switch to Heparin when INR is subtherapeutic     S/P AVR (aortic valve replacement) - bioprosthetic    PACEMAKER, PERMANENT/   Complete heart block   CAD s/p CABG - not on ASA- cont statin    Malnutrition  of moderate degree - supplements ordered  Dementia - following for behavioral disturbances   DVT prophylaxis: Coumadin Code Status: Full code Family Communication: wife Disposition Plan: home when stable Consultants:   IR Procedures:   Thoracentesis  Antimicrobials:  Anti-infectives    Start     Dose/Rate Route Frequency Ordered Stop   07/05/16 1330  fluconazole (DIFLUCAN) tablet 100 mg     100 mg Oral Daily 07/05/16 1231     07/04/16 2200  vancomycin (VANCOCIN) 1,750 mg in sodium chloride 0.9 % 500 mL IVPB  Status:  Discontinued     1,750 mg 250 mL/hr over 120 Minutes Intravenous Every 24 hours 07/04/16 2132 07/05/16 1231   07/03/16 1930  cephALEXin (KEFLEX) capsule 500 mg  Status:  Discontinued     500 mg Oral Every 8 hours 07/03/16 1835 07/05/16 1458       Objective: Vitals:   07/05/16 1016 07/05/16 1225 07/05/16 2135 07/06/16 0545  BP: (!) 120/48 (!) 111/43 (!) 112/41 (!) 123/51  Pulse:  (!) 59  66  Resp:  20 18 18   Temp:  97.8 F (36.6 C) 98.3 F (36.8 C) 98.1 F (36.7 C)  TempSrc:  Oral Oral Oral  SpO2:  94% 96% 93%  Weight:    87.9 kg (193 lb 12.8 oz)  Height:        Intake/Output Summary (Last 24 hours) at 07/06/16 1127 Last data filed at 07/06/16 1000  Gross per 24 hour  Intake  1320 ml  Output              910 ml  Net              410 ml   Filed Weights   07/04/16 0533 07/05/16 0534 07/06/16 0545  Weight: 87 kg (191 lb 11.2 oz) 86.4 kg (190 lb 6.4 oz) 87.9 kg (193 lb 12.8 oz)    Examination: General exam: Appears comfortable  HEENT: PERRLA, oral mucosa moist, no sclera icterus or thrush Respiratory system: CTA b/l - Respiratory effort normal. Cardiovascular system: S1 & S2 heard, RRR.  No murmurs  Gastrointestinal system: Abdomen soft, non-tender, nondistended. Normal bowel sound. No organomegaly Central nervous system: Alert and oriented. No focal neurological deficits. Extremities: No cyanosis, clubbing or edema Skin: No  rashes or ulcers Psychiatry:  Mood & affect appropriate.     Data Reviewed: I have personally reviewed following labs and imaging studies  CBC:  Recent Labs Lab 07/03/16 1540 07/05/16 0613 07/06/16 0433  WBC 8.3 11.4* 12.9*  NEUTROABS 5.9  --  9.6*  HGB 12.0* 12.5* 12.3*  HCT 37.3* 38.9* 37.6*  MCV 87.1 86.8 87.9  PLT 203 227 [id number]   Basic Metabolic Panel:  Recent Labs Lab 07/03/16 1540 07/04/16 0502 07/05/16 0613 07/05/16 1534 07/06/16 0433  NA 137 140 136 137 134*  K 3.7 3.6 3.8 3.6 3.9  CL 100* 99* 95* 95* 92*  CO2 29 31 34* 32 33*  GLUCOSE 168* 94 207* 81 114*  BUN 25* 19 29* 36* 44*  CREATININE 1.19 1.15 1.27* 1.25* 1.31*  CALCIUM 10.0 9.8 9.8 9.9 10.0   GFR: Estimated Creatinine Clearance: 51.1 mL/min (by C-G formula based on SCr of 1.31 mg/dL (H)). Liver Function Tests:  Recent Labs Lab 07/04/16 0502  AST 29  ALT 25  ALKPHOS 129*  BILITOT 0.8  PROT 6.8  ALBUMIN 2.5*   No results for input(s): LIPASE, AMYLASE in the last 168 hours. No results for input(s): AMMONIA in the last 168 hours. Coagulation Profile:  Recent Labs Lab 07/03/16 1540 07/04/16 0502 07/05/16 0613 07/06/16 0433  INR 3.51 3.45 2.68 2.26   Cardiac Enzymes: No results for input(s): CKTOTAL, CKMB, CKMBINDEX, TROPONINI in the last 168 hours. BNP (last 3 results) No results for input(s): PROBNP in the last 8760 hours. HbA1C: No results for input(s): HGBA1C in the last 72 hours. CBG:  Recent Labs Lab 07/05/16 0557 07/05/16 1133 07/05/16 1644 07/05/16 2131 07/06/16 0552  GLUCAP 178* 87 91 147* 116*   Lipid Profile: No results for input(s): CHOL, HDL, LDLCALC, TRIG, CHOLHDL, LDLDIRECT in the last 72 hours. Thyroid Function Tests: No results for input(s): TSH, T4TOTAL, FREET4, T3FREE, THYROIDAB in the last 72 hours. Anemia Panel: No results for input(s): VITAMINB12, FOLATE, FERRITIN, TIBC, IRON, RETICCTPCT in the last 72 hours. Urine analysis:    Component Value  Date/Time   COLORURINE YELLOW 01/14/2011 [place] 01/14/2011 1149   LABSPEC 1.011 01/14/2011 1149   PHURINE 7.0 01/14/2011 1149   GLUCOSEU NEGATIVE 01/14/2011 1149   HGBUR NEGATIVE 01/14/2011 [place] 01/14/2011 1149   KETONESUR NEGATIVE 01/14/2011 1149   PROTEINUR NEGATIVE 01/14/2011 1149   UROBILINOGEN 1.0 01/14/2011 1149   NITRITE NEGATIVE 01/14/2011 1149   LEUKOCYTESUR TRACE (A) 01/14/2011 1149   Sepsis Labs: @LABRCNTIP (procalcitonin:4,lacticidven:4) ) Recent Results (from the past 240 hour(s))  Culture, blood (routine x 2)     Status: Abnormal   Collection Time: 07/03/16  9:40 PM  Result Value Ref Range Status   Specimen Description BLOOD RIGHT ARM  Final   Special Requests BOTTLES DRAWN AEROBIC AND ANAEROBIC 5ML  Final   Culture  Setup Time   Final    GRAM POSITIVE COCCI IN CLUSTERS IN BOTH AEROBIC AND ANAEROBIC BOTTLES CRITICAL RESULT CALLED TO, READ BACK BY AND VERIFIED WITH: T. DANG, PHARMD, 07/04/16 2117 BY J. FUDESCO    Culture (A)  Final    STAPHYLOCOCCUS SPECIES (COAGULASE NEGATIVE) THE SIGNIFICANCE OF ISOLATING THIS ORGANISM FROM A SINGLE SET OF BLOOD CULTURES WHEN MULTIPLE SETS ARE DRAWN IS UNCERTAIN. PLEASE NOTIFY THE MICROBIOLOGY DEPARTMENT WITHIN ONE WEEK IF SPECIATION AND SENSITIVITIES ARE REQUIRED.    Report Status 07/06/2016 FINAL  Final  Blood Culture ID Panel (Reflexed)     Status: Abnormal   Collection Time: 07/03/16  9:40 PM  Result Value Ref Range Status   Enterococcus species NOT DETECTED NOT DETECTED Final   Listeria monocytogenes NOT DETECTED NOT DETECTED Final   Staphylococcus species DETECTED (A) NOT DETECTED Final    Comment: CRITICAL RESULT CALLED TO, READ BACK BY AND VERIFIED WITH: T. DANG PHARMD 07/04/16 2117 BY J FUDESCO    Staphylococcus aureus NOT DETECTED NOT DETECTED Final   Methicillin resistance DETECTED (A) NOT DETECTED Final    Comment: CRITICAL RESULT CALLED TO, READ BACK BY AND VERIFIED  WITH: T. DANG PHARMD 07/04/16 2117 BY J. FUDESCO    Streptococcus species NOT DETECTED NOT DETECTED Final   Streptococcus agalactiae NOT DETECTED NOT DETECTED Final   Streptococcus pneumoniae NOT DETECTED NOT DETECTED Final   Streptococcus pyogenes NOT DETECTED NOT DETECTED Final   Acinetobacter baumannii NOT DETECTED NOT DETECTED Final   Enterobacteriaceae species NOT DETECTED NOT DETECTED Final   Enterobacter cloacae complex NOT DETECTED NOT DETECTED Final   Escherichia coli NOT DETECTED NOT DETECTED Final   Klebsiella oxytoca NOT DETECTED NOT DETECTED Final   Klebsiella pneumoniae NOT DETECTED NOT DETECTED Final   Proteus species NOT DETECTED NOT DETECTED Final   Serratia marcescens NOT DETECTED NOT DETECTED Final   Haemophilus influenzae NOT DETECTED NOT DETECTED Final   Neisseria meningitidis NOT DETECTED NOT DETECTED Final   Pseudomonas aeruginosa NOT DETECTED NOT DETECTED Final   Candida albicans NOT DETECTED NOT DETECTED Final   Candida glabrata NOT DETECTED NOT DETECTED Final   Candida krusei NOT DETECTED NOT DETECTED Final   Candida parapsilosis NOT DETECTED NOT DETECTED Final   Candida tropicalis NOT DETECTED NOT DETECTED Final  Culture, blood (routine x 2)     Status: None (Preliminary result)   Collection Time: 07/03/16  9:46 PM  Result Value Ref Range Status   Specimen Description BLOOD LEFT ARM  Final   Special Requests BOTTLES DRAWN AEROBIC AND ANAEROBIC 5ML  Final   Culture NO GROWTH 2 DAYS  Final   Report Status PENDING  Incomplete  Culture, body fluid-bottle     Status: None (Preliminary result)   Collection Time: 07/04/16 10:34 AM  Result Value Ref Range Status   Specimen Description FLUID PLEURAL RIGHT  Final   Special Requests NONE  Final   Culture NO GROWTH 2 DAYS  Final   Report Status PENDING  Incomplete  Gram stain     Status: None   Collection Time: 07/04/16 10:34 AM  Result Value Ref Range Status   Specimen Description FLUID PLEURAL RIGHT  Final    Special Requests NONE  Final   Gram Stain   Final    RARE WBC PRESENT, PREDOMINANTLY MONONUCLEAR NO  ORGANISMS SEEN    Report Status 07/04/2016 FINAL  Final  Blood Cultures x 2 sites     Status: None (Preliminary result)   Collection Time: 07/05/16  3:34 PM  Result Value Ref Range Status   Specimen Description BLOOD RIGHT ARM  Final   Special Requests BOTTLES DRAWN AEROBIC AND ANAEROBIC 4CC  Final   Culture NO GROWTH < 24 HOURS  Final   Report Status PENDING  Incomplete  Blood Cultures x 2 sites     Status: None (Preliminary result)   Collection Time: 07/05/16  3:44 PM  Result Value Ref Range Status   Specimen Description BLOOD LEFT ARM  Final   Special Requests BOTTLES DRAWN AEROBIC AND ANAEROBIC 5CC  Final   Culture NO GROWTH < 24 HOURS  Final   Report Status PENDING  Incomplete         Radiology Studies: No results found.    Scheduled Meds: . collagenase   Topical Daily  . donepezil  10 mg Oral QHS  . ezetimibe-simvastatin  1 tablet Oral QHS  . feeding supplement (ENSURE ENLIVE)  237 mL Oral Q24H  . feeding supplement (PRO-STAT SUGAR FREE 64)  30 mL Oral BID  . fluconazole  100 mg Oral Daily  . furosemide  40 mg Oral Daily  . furosemide  80 mg Oral Daily  . insulin aspart  0-15 Units Subcutaneous TID WC  . insulin aspart  15 Units Subcutaneous TID WC  . insulin glargine  15 Units Subcutaneous QHS  . lisinopril  10 mg Oral Daily  . multivitamin with minerals  1 tablet Oral Daily  . sodium chloride flush  3 mL Intravenous Q12H  . spironolactone  25 mg Oral q1800   Continuous Infusions:   LOS: 3 days    Time spent in minutes: 2    Seth Vesely, MD [facility] Pager: [email or web address] Password [facility] 07/06/2016, 11:27 AM

## 2016-07-07 ENCOUNTER — Ambulatory Visit: Payer: Medicare Other | Admitting: Surgery

## 2016-07-07 DIAGNOSIS — Z87891 Personal history of nicotine dependence: Secondary | ICD-10-CM

## 2016-07-07 DIAGNOSIS — Z89412 Acquired absence of left great toe: Secondary | ICD-10-CM

## 2016-07-07 DIAGNOSIS — Z823 Family history of stroke: Secondary | ICD-10-CM

## 2016-07-07 DIAGNOSIS — B372 Candidiasis of skin and nail: Secondary | ICD-10-CM

## 2016-07-07 DIAGNOSIS — Z794 Long term (current) use of insulin: Secondary | ICD-10-CM

## 2016-07-07 DIAGNOSIS — Z950 Presence of cardiac pacemaker: Secondary | ICD-10-CM

## 2016-07-07 DIAGNOSIS — Z800 Family history of malignant neoplasm of digestive organs: Secondary | ICD-10-CM

## 2016-07-07 DIAGNOSIS — E11621 Type 2 diabetes mellitus with foot ulcer: Secondary | ICD-10-CM

## 2016-07-07 DIAGNOSIS — Z833 Family history of diabetes mellitus: Secondary | ICD-10-CM

## 2016-07-07 DIAGNOSIS — M86172 Other acute osteomyelitis, left ankle and foot: Secondary | ICD-10-CM

## 2016-07-07 DIAGNOSIS — Z8042 Family history of malignant neoplasm of prostate: Secondary | ICD-10-CM

## 2016-07-07 DIAGNOSIS — L97529 Non-pressure chronic ulcer of other part of left foot with unspecified severity: Secondary | ICD-10-CM

## 2016-07-07 DIAGNOSIS — Z952 Presence of prosthetic heart valve: Secondary | ICD-10-CM

## 2016-07-07 DIAGNOSIS — Z8249 Family history of ischemic heart disease and other diseases of the circulatory system: Secondary | ICD-10-CM

## 2016-07-07 DIAGNOSIS — Z888 Allergy status to other drugs, medicaments and biological substances status: Secondary | ICD-10-CM

## 2016-07-07 LAB — VAS US LOWER EXTREMITY ARTERIAL DUPLEX
LATIBMIDSYS: -66 cm/s
LATIBPSV: -106 cm/s
LEFT PERO MID SYS: 36 cm/s
LPERODISTSYS: 120 cm/s
LPTIBDISTSYS: 27 cm/s
LPTIBPROXSYS: 56 cm/s
LSFMPSV: -163 cm/s
LSFPPSV: -85 cm/s
Left ant tibial distal sys: -102 cm/s
Left peroneal sys PSV: 35 cm/s
Left popliteal dist sys PSV: -86 cm/s
Left popliteal prox sys PSV: 60 cm/s
Left super femoral dist sys PSV: -102 cm/s
left post tibial mid sys: 27 cm/s

## 2016-07-07 LAB — BASIC METABOLIC PANEL
Anion gap: 8 (ref 5–15)
BUN: 44 mg/dL — AB (ref 6–20)
CALCIUM: 10.1 mg/dL (ref 8.9–10.3)
CO2: 35 mmol/L — ABNORMAL HIGH (ref 22–32)
CREATININE: 1.29 mg/dL — AB (ref 0.61–1.24)
Chloride: 93 mmol/L — ABNORMAL LOW (ref 101–111)
GFR calc Af Amer: 59 mL/min — ABNORMAL LOW (ref >60)
GFR, EST NON AFRICAN AMERICAN: 51 mL/min — AB (ref >60)
GLUCOSE: 93 mg/dL (ref 65–99)
Potassium: 4 mmol/L (ref 3.5–5.1)
Sodium: 136 mmol/L (ref 135–145)

## 2016-07-07 LAB — PROTIME-INR
INR: 1.78
PROTHROMBIN TIME: 20.9 s — AB (ref 11.4–15.2)

## 2016-07-07 LAB — GLUCOSE, CAPILLARY
GLUCOSE-CAPILLARY: 97 mg/dL (ref 65–99)
GLUCOSE-CAPILLARY: 100 mg/dL — AB (ref 65–99)
GLUCOSE-CAPILLARY: 50 mg/dL — AB (ref 65–99)
Glucose-Capillary: 191 mg/dL — ABNORMAL HIGH (ref 65–99)
Glucose-Capillary: 131 mg/dL — ABNORMAL HIGH (ref 65–99)
Glucose-Capillary: 129 mg/dL — ABNORMAL HIGH (ref 65–99)

## 2016-07-07 LAB — CBC
HCT: 38.6 % — ABNORMAL LOW (ref 39.0–52.0)
Hemoglobin: 12.6 g/dL — ABNORMAL LOW (ref 13.0–17.0)
MCH: 28.8 pg (ref 26.0–34.0)
MCHC: 32.6 g/dL (ref 30.0–36.0)
MCV: 88.3 fL (ref 78.0–100.0)
Platelets: 223 10*3/uL (ref 150–400)
RBC: 4.37 MIL/uL (ref 4.22–5.81)
RDW: 14.3 % (ref 11.5–15.5)
WBC: 11.2 10*3/uL — ABNORMAL HIGH (ref 4.0–10.5)

## 2016-07-07 LAB — HEPATITIS C ANTIBODY (REFLEX)

## 2016-07-07 LAB — HEMOGLOBIN A1C
HEMOGLOBIN A1C: 7.4 % — AB (ref 4.8–5.6)
MEAN PLASMA GLUCOSE: 166 mg/dL

## 2016-07-07 LAB — HCV COMMENT:

## 2016-07-07 LAB — HEPARIN LEVEL (UNFRACTIONATED): Heparin Unfractionated: 0.24 IU/mL — ABNORMAL LOW (ref 0.30–0.70)

## 2016-07-07 MED ORDER — DEXTROSE 50 % IV SOLN
INTRAVENOUS | Status: AC
  Filled 2016-07-07: qty 50

## 2016-07-07 MED ORDER — HEPARIN (PORCINE) IN NACL 100-0.45 UNIT/ML-% IJ SOLN
1400.0000 [IU]/h | INTRAMUSCULAR | Status: DC
  Administered 2016-07-07: 1300 [IU]/h via INTRAVENOUS
  Administered 2016-07-08: 1400 [IU]/h via INTRAVENOUS
  Filled 2016-07-07 (×2): qty 250

## 2016-07-07 MED ORDER — DEXTROSE 50 % IV SOLN
50.0000 mL | Freq: Once | INTRAVENOUS | Status: AC
  Administered 2016-07-07: 50 mL via INTRAVENOUS

## 2016-07-07 MED ORDER — INSULIN GLARGINE 100 UNIT/ML ~~LOC~~ SOLN
13.0000 [IU] | Freq: Every day | SUBCUTANEOUS | Status: DC
  Administered 2016-07-07 – 2016-07-10 (×3): 13 [IU] via SUBCUTANEOUS
  Filled 2016-07-07 (×6): qty 0.13

## 2016-07-07 NOTE — Progress Notes (Signed)
PROGRESS NOTE    [name].  [id number] DOB: 02-Sep-1934 DOA: 07/03/2016  PCP: [name], MD   Brief Narrative:  [name]. is a 82 y.o. male with medical history significant of dementia, poorly controlled DM, severe AS status post redo aVR (bioprosthetic valve), chronic diastolic heart failure, CAD status post CABG, chronic valvular atrial fibrillation on Coumadin, history of left atrial thrombus noted on TEE in 7/12, history of peripheral vascular disease s/p PTCA of R tibial artery and Left SFA stent placement,  amputation of L great toe and subsequent left foot ulcer with osteomyelitis on Keflex was referred from the infectious disease MDs office for evaluation of hypoxia.  Subjective: Confused, no complaints.   Assessment & Plan:   Principal Problem:   Acute diastolic CHF (congestive heart failure), acute hypoxic resp failure - started IV lasix- has diuresed well- pulse ox 97% on room air -switched Lasix to oral home dose of 80 in AM and 40 in PM - cont aldactone, Lisinopril -  Following  Cr, I and O, daily wieghts  Active Problems: Right Pleural effusion - likely due to above- thoracentesis 1/12 with removal of 1.8 L yellow fluid - per Light's criteria, fluid is transudative - no further work up needed    Diabetic osteomyelitis  -  Has been on Keflex but wound continues to be infected which is why I have re-consulted ID and Vascular sugery -  will have Angiogram on Wednesday once right groin candida infection clears - ID has discontinued antibiotics for now - wound care consulted    Chronic atrial fibrillation - CHA2DS2-VASc Score at least 6 - not on rate controlling agent- hold [place] for angiogram   Candida dermatitis of groin - Fluconazole started by ID- add Nystatin powder- cannot have angio until this clears  H/o atrial thrombus - Coumadin- switching to Heparin as INR is subtherapeutic     S/P AVR (aortic valve replacement) - bioprosthetic    PACEMAKER, PERMANENT/   Complete heart block   CAD s/p CABG - not on ASA- cont statin    Malnutrition of moderate degree - supplements ordered  Dementia - following for behavioral disturbances   DVT prophylaxis: Coumadin Code Status: Full code Family Communication: wife Disposition Plan: home when stable Consultants:   IR Procedures:   Thoracentesis  Antimicrobials:  Anti-infectives    Start     Dose/Rate Route Frequency Ordered Stop   07/05/16 1330  fluconazole (DIFLUCAN) tablet 100 mg     100 mg Oral Daily 07/05/16 1231     07/04/16 2200  vancomycin (VANCOCIN) 1,750 mg in sodium chloride 0.9 % 500 mL IVPB  Status:  Discontinued     1,750 mg 250 mL/hr over 120 Minutes Intravenous Every 24 hours 07/04/16 2132 07/05/16 1231   07/03/16 1930  cephALEXin (KEFLEX) capsule 500 mg  Status:  Discontinued     500 mg Oral Every 8 hours 07/03/16 1835 07/05/16 1458       Objective: Vitals:   07/06/16 1828 07/06/16 1830 07/06/16 1941 07/07/16 0621  BP:   (!) 104/39 (!) 109/55  Pulse:   (!) 59 61  Resp:   19 19  Temp:   97.3 F (36.3 C) 97.5 F (36.4 C)  TempSrc:   Oral Oral  SpO2: (!) 88% 97% 94% 100%  Weight:    88.1 kg (194 lb 3.2 oz)  Height:        Intake/Output Summary (Last 24 hours) at 07/07/16 0855 Last data  filed at 07/07/16 [id number]  Gross per 24 hour  Intake              600 ml  Output              201 ml  Net              399 ml   Filed Weights   07/05/16 0534 07/06/16 0545 07/07/16 [id number]  Weight: 86.4 kg (190 lb 6.4 oz) 87.9 kg (193 lb 12.8 oz) 88.1 kg (194 lb 3.2 oz)    Examination: General exam: Appears comfortable  HEENT: PERRLA, oral mucosa moist, no sclera icterus or thrush Respiratory system: CTA b/l - Respiratory effort normal. Cardiovascular system: S1 & S2 heard, RRR.  No murmurs  Gastrointestinal system: Abdomen soft, non-tender, nondistended. Normal bowel sound. No organomegaly Central nervous system: Alert and oriented. No focal  neurological deficits. Extremities: No cyanosis, clubbing or edema Skin: wound on left foot at head of L metatarsal Psychiatry:  Mood & affect appropriate.     Data Reviewed: I have personally reviewed following labs and imaging studies  CBC:  Recent Labs Lab 07/03/16 1540 07/05/16 0613 07/06/16 0433 07/07/16 0545  WBC 8.3 11.4* 12.9* 11.2*  NEUTROABS 5.9  --  9.6*  --   HGB 12.0* 12.5* 12.3* 12.6*  HCT 37.3* 38.9* 37.6* 38.6*  MCV 87.1 86.8 87.9 88.3  PLT 203 227 228 [id number]   Basic Metabolic Panel:  Recent Labs Lab 07/04/16 0502 07/05/16 0613 07/05/16 1534 07/06/16 0433 07/07/16 0545  NA 140 136 137 134* 136  K 3.6 3.8 3.6 3.9 4.0  CL 99* 95* 95* 92* 93*  CO2 31 34* 32 33* 35*  GLUCOSE 94 207* 81 114* 93  BUN 19 29* 36* 44* 44*  CREATININE 1.15 1.27* 1.25* 1.31* 1.29*  CALCIUM 9.8 9.8 9.9 10.0 10.1   GFR: Estimated Creatinine Clearance: 51.9 mL/min (by C-G formula based on SCr of 1.29 mg/dL (H)). Liver Function Tests:  Recent Labs Lab 07/04/16 0502  AST 29  ALT 25  ALKPHOS 129*  BILITOT 0.8  PROT 6.8  ALBUMIN 2.5*   No results for input(s): LIPASE, AMYLASE in the last 168 hours. No results for input(s): AMMONIA in the last 168 hours. Coagulation Profile:  Recent Labs Lab 07/03/16 1540 07/04/16 0502 07/05/16 0613 07/06/16 0433 07/07/16 0545  INR 3.51 3.45 2.68 2.26 1.78   Cardiac Enzymes: No results for input(s): CKTOTAL, CKMB, CKMBINDEX, TROPONINI in the last 168 hours. BNP (last 3 results) No results for input(s): PROBNP in the last 8760 hours. HbA1C: No results for input(s): HGBA1C in the last 72 hours. CBG:  Recent Labs Lab 07/06/16 0552 07/06/16 1150 07/06/16 1617 07/06/16 2204 07/07/16 0545  GLUCAP 116* 93 109* 127* 97   Lipid Profile: No results for input(s): CHOL, HDL, LDLCALC, TRIG, CHOLHDL, LDLDIRECT in the last 72 hours. Thyroid Function Tests: No results for input(s): TSH, T4TOTAL, FREET4, T3FREE, THYROIDAB in the last  72 hours. Anemia Panel: No results for input(s): VITAMINB12, FOLATE, FERRITIN, TIBC, IRON, RETICCTPCT in the last 72 hours. Urine analysis:    Component Value Date/Time   COLORURINE YELLOW 01/14/2011 [place] 01/14/2011 1149   LABSPEC 1.011 01/14/2011 1149   PHURINE 7.0 01/14/2011 1149   GLUCOSEU NEGATIVE 01/14/2011 1149   HGBUR NEGATIVE 01/14/2011 [place] 01/14/2011 1149   KETONESUR NEGATIVE 01/14/2011 1149   PROTEINUR NEGATIVE 01/14/2011 1149   UROBILINOGEN 1.0 01/14/2011 1149   NITRITE NEGATIVE 01/14/2011  [place] (A) 01/14/2011 1149   Sepsis Labs: @LABRCNTIP (procalcitonin:4,lacticidven:4) ) Recent Results (from the past 240 hour(s))  Culture, blood (routine x 2)     Status: Abnormal   Collection Time: 07/03/16  9:40 PM  Result Value Ref Range Status   Specimen Description BLOOD RIGHT ARM  Final   Special Requests BOTTLES DRAWN AEROBIC AND ANAEROBIC 5ML  Final   Culture  Setup Time   Final    GRAM POSITIVE COCCI IN CLUSTERS IN BOTH AEROBIC AND ANAEROBIC BOTTLES CRITICAL RESULT CALLED TO, READ BACK BY AND VERIFIED WITH: T. DANG, PHARMD, 07/04/16 2117 BY J. FUDESCO    Culture (A)  Final    STAPHYLOCOCCUS SPECIES (COAGULASE NEGATIVE) THE SIGNIFICANCE OF ISOLATING THIS ORGANISM FROM A SINGLE SET OF BLOOD CULTURES WHEN MULTIPLE SETS ARE DRAWN IS UNCERTAIN. PLEASE NOTIFY THE MICROBIOLOGY DEPARTMENT WITHIN ONE WEEK IF SPECIATION AND SENSITIVITIES ARE REQUIRED.    Report Status 07/06/2016 FINAL  Final  Blood Culture ID Panel (Reflexed)     Status: Abnormal   Collection Time: 07/03/16  9:40 PM  Result Value Ref Range Status   Enterococcus species NOT DETECTED NOT DETECTED Final   Listeria monocytogenes NOT DETECTED NOT DETECTED Final   Staphylococcus species DETECTED (A) NOT DETECTED Final    Comment: CRITICAL RESULT CALLED TO, READ BACK BY AND VERIFIED WITH: T. DANG PHARMD 07/04/16 2117 BY J FUDESCO    Staphylococcus aureus  NOT DETECTED NOT DETECTED Final   Methicillin resistance DETECTED (A) NOT DETECTED Final    Comment: CRITICAL RESULT CALLED TO, READ BACK BY AND VERIFIED WITH: T. DANG PHARMD 07/04/16 2117 BY J. FUDESCO    Streptococcus species NOT DETECTED NOT DETECTED Final   Streptococcus agalactiae NOT DETECTED NOT DETECTED Final   Streptococcus pneumoniae NOT DETECTED NOT DETECTED Final   Streptococcus pyogenes NOT DETECTED NOT DETECTED Final   Acinetobacter baumannii NOT DETECTED NOT DETECTED Final   Enterobacteriaceae species NOT DETECTED NOT DETECTED Final   Enterobacter cloacae complex NOT DETECTED NOT DETECTED Final   Escherichia coli NOT DETECTED NOT DETECTED Final   Klebsiella oxytoca NOT DETECTED NOT DETECTED Final   Klebsiella pneumoniae NOT DETECTED NOT DETECTED Final   Proteus species NOT DETECTED NOT DETECTED Final   Serratia marcescens NOT DETECTED NOT DETECTED Final   Haemophilus influenzae NOT DETECTED NOT DETECTED Final   Neisseria meningitidis NOT DETECTED NOT DETECTED Final   Pseudomonas aeruginosa NOT DETECTED NOT DETECTED Final   Candida albicans NOT DETECTED NOT DETECTED Final   Candida glabrata NOT DETECTED NOT DETECTED Final   Candida krusei NOT DETECTED NOT DETECTED Final   Candida parapsilosis NOT DETECTED NOT DETECTED Final   Candida tropicalis NOT DETECTED NOT DETECTED Final  Culture, blood (routine x 2)     Status: None (Preliminary result)   Collection Time: 07/03/16  9:46 PM  Result Value Ref Range Status   Specimen Description BLOOD LEFT ARM  Final   Special Requests BOTTLES DRAWN AEROBIC AND ANAEROBIC 5ML  Final   Culture NO GROWTH 2 DAYS  Final   Report Status PENDING  Incomplete  Culture, body fluid-bottle     Status: None (Preliminary result)   Collection Time: 07/04/16 10:34 AM  Result Value Ref Range Status   Specimen Description FLUID PLEURAL RIGHT  Final   Special Requests NONE  Final   Culture NO GROWTH 2 DAYS  Final   Report Status PENDING  Incomplete   Gram stain     Status: None   Collection  Time: 07/04/16 10:34 AM  Result Value Ref Range Status   Specimen Description FLUID PLEURAL RIGHT  Final   Special Requests NONE  Final   Gram Stain   Final    RARE WBC PRESENT, PREDOMINANTLY MONONUCLEAR NO ORGANISMS SEEN    Report Status 07/04/2016 FINAL  Final  Blood Cultures x 2 sites     Status: None (Preliminary result)   Collection Time: 07/05/16  3:34 PM  Result Value Ref Range Status   Specimen Description BLOOD RIGHT ARM  Final   Special Requests BOTTLES DRAWN AEROBIC AND ANAEROBIC 4CC  Final   Culture NO GROWTH < 24 HOURS  Final   Report Status PENDING  Incomplete  Blood Cultures x 2 sites     Status: None (Preliminary result)   Collection Time: 07/05/16  3:44 PM  Result Value Ref Range Status   Specimen Description BLOOD LEFT ARM  Final   Special Requests BOTTLES DRAWN AEROBIC AND ANAEROBIC 5CC  Final   Culture NO GROWTH < 24 HOURS  Final   Report Status PENDING  Incomplete         Radiology Studies: No results found.    Scheduled Meds: . collagenase   Topical Daily  . donepezil  10 mg Oral QHS  . ezetimibe-simvastatin  1 tablet Oral QHS  . feeding supplement (ENSURE ENLIVE)  237 mL Oral Q24H  . feeding supplement (PRO-STAT SUGAR FREE 64)  30 mL Oral BID  . fluconazole  100 mg Oral Daily  . furosemide  40 mg Oral Daily  . furosemide  80 mg Oral Q0600  . insulin aspart  0-15 Units Subcutaneous TID WC  . insulin aspart  15 Units Subcutaneous TID WC  . insulin glargine  15 Units Subcutaneous QHS  . lisinopril  10 mg Oral Daily  . multivitamin with minerals  1 tablet Oral Daily  . nystatin   Topical TID  . sodium chloride flush  3 mL Intravenous Q12H  . spironolactone  25 mg Oral q1800   Continuous Infusions:   LOS: 4 days    Time spent in minutes: 2    [place], MD [facility] Pager: [email or web address] Password [facility] 07/07/2016, 8:55 AM

## 2016-07-07 NOTE — Care Management Important Message (Signed)
Important Message  Patient Details  Name: [name]. MRN: [name] Date of Birth: [birth year]   Medicare Important Message Given:  Yes    [name]  07/07/2016, 4:20 PM

## 2016-07-07 NOTE — Progress Notes (Signed)
ANTICOAGULATION CONSULT NOTE - Initial Consult  Pharmacy Consult:  Heparin Indication:  History of Afib  Allergies  Allergen Reactions  . Potassium-Containing Compounds Anaphylaxis    IV--loss of conciousness  . Metoprolol Succinate Other (See Comments)    Extremely tired    Patient Measurements: Height: 5\' 10"  (177.8 cm) Weight: 194 lb 3.2 oz (88.1 kg) (Scale B) IBW/kg (Calculated) : 73 Heparin Dosing Weight: 88 kg  Vital Signs: Temp: 97.5 F (36.4 C) (01/15 0621) Temp Source: Oral (01/15 0621) BP: 109/55 (01/15 0621) Pulse Rate: 61 (01/15 0621)  Labs:  Recent Labs  07/05/16 0613 07/05/16 1534 07/06/16 0433 07/07/16 0545  HGB 12.5*  --  12.3* 12.6*  HCT 38.9*  --  37.6* 38.6*  PLT 227  --  228 223  LABPROT 29.1*  --  25.3* 20.9*  INR 2.68  --  2.26 1.78  CREATININE 1.27* 1.25* 1.31* 1.29*    Estimated Creatinine Clearance: 51.9 mL/min (by C-G formula based on SCr of 1.29 mg/dL (H)).   Medical History: Past Medical History:  Diagnosis Date  . Aortal stenosis 2006   biophrostetic  . Arthritis   . Atrial fibrillation ([place])    onset after CABG--- coumadin management at Coumadin clinic  . Carotid artery disease ([place]) 09/27/2010   Carotid US 12/17: bilat ICA 40-59 >> 1 year FU  . CHF (congestive heart failure) ([place])    abter CABG  . Complete heart block ([place])   . Coronary artery disease   . DM type 2 (diabetes mellitus, type 2) ([place])    used to see Dr Meredith Pel, now f/u by Dr Larose Kells  . Erectile dysfunction   . Gangrene ([place])   . History of nephrolithiasis   . HTN (hypertension)   . Hyperlipidemia   . Memory loss   . Obesity   . OSA (obstructive sleep apnea)    not using CPAP  . Pacemaker 2/07   VVI  . PVD (peripheral vascular disease) ([name])    CT angio showed- vascular insuff, intestine and RAS bilaterally, andgiogram 02/2009 severe PVD medical management  . S/P aortic valve replacement   . Sick sinus syndrome ([name])    [name]  . Skin  cancer    surgery (nose) 09-2010      Assessment: [name] on Coumadin PTA for history of Afib.  He also has a history of bioprosthetic AVR and LAA thrombus.  Coumadin has been on hold for angiogram currently scheduled on Wednesday.  INR decreased to sub-therapeutic level this AM and Pharmacy consulted to initiate IV heparin bridge.  No bleeding reported.   Goal of Therapy:  Heparin level 0.3-0.7 units/ml Monitor platelets by anticoagulation protocol: Yes    Plan:  - Start heparin gtt at 1300 units/hr, no bolus - Check 8hr heparin level - Daily heparin level and CBC   Jo-Anne Kluth D. Mina Marble, PharmD, BCPS Pager:  [phone] 07/07/2016, 9:15 AM

## 2016-07-07 NOTE — Progress Notes (Signed)
Lantus 15 units ordered for bedtime, CBG 129. Page the doctor about dose due to patient being hypoglycemic today. Lantus 13 units at bedtime, new order.  Will continue to monitor.  Tarissa Kerin, RN

## 2016-07-07 NOTE — Progress Notes (Signed)
Patient ID: Seth Pacific., male   DOB: 06/18/1935, 82 y.o.   MRN: [id number]          [name]  Date of Admission:  07/03/2016     Principal Problem:   Diabetic foot ulcer with osteomyelitis ([place]) Active Problems:   Obstructive sleep apnea   S/P AVR (aortic valve replacement)   PACEMAKER, PERMANENT   Aortic valve stenosis, severe prosthetic   Chronic atrial fibrillation ([name])   Complete heart block ([name])   Hypoxia   Acute diastolic CHF (congestive heart failure) ([name])   Malnutrition of moderate degree   Pleural effusion   Congestive heart failure ([name])   S/P thoracentesis   . collagenase   Topical Daily  . donepezil  10 mg Oral QHS  . ezetimibe-simvastatin  1 tablet Oral QHS  . feeding supplement (ENSURE ENLIVE)  237 mL Oral Q24H  . feeding supplement (PRO-STAT SUGAR FREE 64)  30 mL Oral BID  . fluconazole  100 mg Oral Daily  . furosemide  40 mg Oral Daily  . furosemide  80 mg Oral Q0600  . insulin aspart  0-15 Units Subcutaneous TID WC  . insulin aspart  15 Units Subcutaneous TID WC  . insulin glargine  15 Units Subcutaneous QHS  . lisinopril  10 mg Oral Daily  . multivitamin with minerals  1 tablet Oral Daily  . nystatin   Topical TID  . sodium chloride flush  3 mL Intravenous Q12H  . spironolactone  25 mg Oral q1800    SUBJECTIVE: He is not having any pain in his left foot. He cannot tell me how long he has had the ulcer present.  Review of Systems: Review of Systems  Unable to perform ROS: Mental acuity    Past Medical History:  Diagnosis Date  . Aortal stenosis 2006   biophrostetic  . Arthritis   . Atrial fibrillation ([place])    onset after CABG--- coumadin management at Coumadin clinic  . Carotid artery disease ([place]) 09/27/2010   Carotid US 12/17: bilat ICA 40-59 >> 1 year FU  . CHF (congestive heart failure) ([place])    abter CABG  . Complete heart block ([place])   . Coronary artery disease   . DM type 2 (diabetes  mellitus, type 2) ([place])    used to see Dr Meredith Pel, now f/u by Dr Larose Kells  . Erectile dysfunction   . Gangrene ([place])   . History of nephrolithiasis   . HTN (hypertension)   . Hyperlipidemia   . Memory loss   . Obesity   . OSA (obstructive sleep apnea)    not using CPAP  . Pacemaker 2/07   VVI  . PVD (peripheral vascular disease) ([name])    CT angio showed- vascular insuff, intestine and RAS bilaterally, andgiogram 02/2009 severe PVD medical management  . S/P aortic valve replacement   . Sick sinus syndrome ([name])    [name]  . Skin cancer    surgery (nose) 09-2010    Social History  Substance Use Topics  . Smoking status: Former Smoker    Types: Cigarettes    Quit date: 06/23/1988  . Smokeless tobacco: Former Systems developer  . Alcohol use No    Family History  Problem Relation Age of Onset  . Heart attack Father 28  . Heart disease Father     Before age 12  . Hypertension Father   . Stroke Mother 64  . Cancer Sister  Cervical  . Heart attack Sister   . Heart disease Sister   . Diabetes Sister     2 sister w/ DM  . Heart attack Sister   . Heart attack Sister   . Heart disease Sister 56    AAA  and  Stomach Aneurysm  . AAA (abdominal aortic aneurysm) Sister   . Heart disease Sister     Before age 54  . Heart attack Sister 54  . Colon cancer Neg Hx   . Prostate cancer Neg Hx    Allergies  Allergen Reactions  . Potassium-Containing Compounds Anaphylaxis    IV--loss of conciousness  . Metoprolol Succinate Other (See Comments)    Extremely tired    OBJECTIVE: Vitals:   07/06/16 1828 07/06/16 1830 07/06/16 1941 07/07/16 0621  BP:   (!) 104/39 (!) 109/55  Pulse:   (!) 59 61  Resp:   19 19  Temp:   97.3 F (36.3 C) 97.5 F (36.4 C)  TempSrc:   Oral Oral  SpO2: (!) 88% 97% 94% 100%  Weight:    194 lb 3.2 oz (88.1 kg)  Height:       Body mass index is 27.86 kg/m.  Physical Exam  Constitutional:  He is resting quietly in bed. He is pleasantly confused.   Musculoskeletal:  The nickel-sized ulcer on the medial aspect of his left first metatarsal head appears unchanged. His left great toe is surgically absent.  Skin:  The candidal intertrigo in his groin is responding well to oral fluconazole and topical antifungal powder.    Lab Results Lab Results  Component Value Date   WBC 11.2 (H) 07/07/2016   HGB 12.6 (L) 07/07/2016   HCT 38.6 (L) 07/07/2016   MCV 88.3 07/07/2016   PLT 223 07/07/2016    Lab Results  Component Value Date   CREATININE 1.29 (H) 07/07/2016   BUN 44 (H) 07/07/2016   NA 136 07/07/2016   K 4.0 07/07/2016   CL 93 (L) 07/07/2016   CO2 35 (H) 07/07/2016    Lab Results  Component Value Date   ALT 25 07/04/2016   AST 29 07/04/2016   ALKPHOS 129 (H) 07/04/2016   BILITOT 0.8 07/04/2016     Microbiology: Recent Results (from the past 240 hour(s))  Culture, blood (routine x 2)     Status: Abnormal   Collection Time: 07/03/16  9:40 PM  Result Value Ref Range Status   Specimen Description BLOOD RIGHT ARM  Final   Special Requests BOTTLES DRAWN AEROBIC AND ANAEROBIC 5ML  Final   Culture  Setup Time   Final    GRAM POSITIVE COCCI IN CLUSTERS IN BOTH AEROBIC AND ANAEROBIC BOTTLES CRITICAL RESULT CALLED TO, READ BACK BY AND VERIFIED WITH: T. DANG, PHARMD, 07/04/16 2117 BY J. FUDESCO    Culture (A)  Final    STAPHYLOCOCCUS SPECIES (COAGULASE NEGATIVE) THE SIGNIFICANCE OF ISOLATING THIS ORGANISM FROM A SINGLE SET OF BLOOD CULTURES WHEN MULTIPLE SETS ARE DRAWN IS UNCERTAIN. PLEASE NOTIFY THE MICROBIOLOGY DEPARTMENT WITHIN ONE WEEK IF SPECIATION AND SENSITIVITIES ARE REQUIRED.    Report Status 07/06/2016 FINAL  Final  Blood Culture ID Panel (Reflexed)     Status: Abnormal   Collection Time: 07/03/16  9:40 PM  Result Value Ref Range Status   Enterococcus species NOT DETECTED NOT DETECTED Final   Listeria monocytogenes NOT DETECTED NOT DETECTED Final   Staphylococcus species DETECTED (A) NOT DETECTED Final    Comment:  CRITICAL RESULT CALLED TO, READ  BACK BY AND VERIFIED WITH: T. DANG PHARMD 07/04/16 2117 BY J FUDESCO    Staphylococcus aureus NOT DETECTED NOT DETECTED Final   Methicillin resistance DETECTED (A) NOT DETECTED Final    Comment: CRITICAL RESULT CALLED TO, READ BACK BY AND VERIFIED WITH: T. DANG PHARMD 07/04/16 2117 BY J. FUDESCO    Streptococcus species NOT DETECTED NOT DETECTED Final   Streptococcus agalactiae NOT DETECTED NOT DETECTED Final   Streptococcus pneumoniae NOT DETECTED NOT DETECTED Final   Streptococcus pyogenes NOT DETECTED NOT DETECTED Final   Acinetobacter baumannii NOT DETECTED NOT DETECTED Final   Enterobacteriaceae species NOT DETECTED NOT DETECTED Final   Enterobacter cloacae complex NOT DETECTED NOT DETECTED Final   Escherichia coli NOT DETECTED NOT DETECTED Final   Klebsiella oxytoca NOT DETECTED NOT DETECTED Final   Klebsiella pneumoniae NOT DETECTED NOT DETECTED Final   Proteus species NOT DETECTED NOT DETECTED Final   Serratia marcescens NOT DETECTED NOT DETECTED Final   Haemophilus influenzae NOT DETECTED NOT DETECTED Final   Neisseria meningitidis NOT DETECTED NOT DETECTED Final   Pseudomonas aeruginosa NOT DETECTED NOT DETECTED Final   Candida albicans NOT DETECTED NOT DETECTED Final   Candida glabrata NOT DETECTED NOT DETECTED Final   Candida krusei NOT DETECTED NOT DETECTED Final   Candida parapsilosis NOT DETECTED NOT DETECTED Final   Candida tropicalis NOT DETECTED NOT DETECTED Final  Culture, blood (routine x 2)     Status: None (Preliminary result)   Collection Time: 07/03/16  9:46 PM  Result Value Ref Range Status   Specimen Description BLOOD LEFT ARM  Final   Special Requests BOTTLES DRAWN AEROBIC AND ANAEROBIC 5ML  Final   Culture NO GROWTH 3 DAYS  Final   Report Status PENDING  Incomplete  Culture, body fluid-bottle     Status: None (Preliminary result)   Collection Time: 07/04/16 10:34 AM  Result Value Ref Range Status   Specimen Description  FLUID PLEURAL RIGHT  Final   Special Requests NONE  Final   Culture NO GROWTH 3 DAYS  Final   Report Status PENDING  Incomplete  Gram stain     Status: None   Collection Time: 07/04/16 10:34 AM  Result Value Ref Range Status   Specimen Description FLUID PLEURAL RIGHT  Final   Special Requests NONE  Final   Gram Stain   Final    RARE WBC PRESENT, PREDOMINANTLY MONONUCLEAR NO ORGANISMS SEEN    Report Status 07/04/2016 FINAL  Final  Blood Cultures x 2 sites     Status: None (Preliminary result)   Collection Time: 07/05/16  3:34 PM  Result Value Ref Range Status   Specimen Description BLOOD RIGHT ARM  Final   Special Requests BOTTLES DRAWN AEROBIC AND ANAEROBIC 4CC  Final   Culture NO GROWTH 2 DAYS  Final   Report Status PENDING  Incomplete  Blood Cultures x 2 sites     Status: None (Preliminary result)   Collection Time: 07/05/16  3:44 PM  Result Value Ref Range Status   Specimen Description BLOOD LEFT ARM  Final   Special Requests BOTTLES DRAWN AEROBIC AND ANAEROBIC 5CC  Final   Culture NO GROWTH 2 DAYS  Final   Report Status PENDING  Incomplete     ASSESSMENT: He has a chronic diabetic foot ulcer with osteomyelitis of the left first metatarsal head. I will hold off on empiric antibiotic therapy pending further evaluation of his arterial supply and the need for I&D with bone biopsy versus higher amputation.  PLAN: 1. Observe off of antibiotic therapy  Michel Bickers, MD [facility] for [place] [phone] pager   [phone] cell 07/07/2016, 12:31 PM

## 2016-07-07 NOTE — Progress Notes (Signed)
  Progress Note    07/07/2016 8:22 AM * No surgery found *  Subjective:  No new complaints  Vitals:   07/06/16 1941 07/07/16 0621  BP: (!) 104/39 (!) 109/55  Pulse: (!) 59 61  Resp: 19 19  Temp: 97.3 F (36.3 C) 97.5 F (36.4 C)    Physical Exam: aaox3 Palpable femoral and popliteal pulses Left foot dressing cdi R groin improving from infection standpoint.  CBC    Component Value Date/Time   WBC 11.2 (H) 07/07/2016 0545   RBC 4.37 07/07/2016 0545   HGB 12.6 (L) 07/07/2016 0545   HGB 14.1 11/22/2010 0953   HCT 38.6 (L) 07/07/2016 0545   HCT 42.6 11/22/2010 0953   PLT 223 07/07/2016 0545   PLT 223 11/22/2010 0953   MCV 88.3 07/07/2016 0545   MCV 82 11/22/2010 0953   MCH 28.8 07/07/2016 0545   MCHC 32.6 07/07/2016 0545   RDW 14.3 07/07/2016 0545   RDW 16.3 (H) 11/22/2010 0953   LYMPHSABS 2.1 07/06/2016 0433   LYMPHSABS 2.4 11/22/2010 0953   MONOABS 1.0 07/06/2016 0433   EOSABS 0.2 07/06/2016 0433   EOSABS 0.4 11/22/2010 0953   BASOSABS 0.0 07/06/2016 0433   BASOSABS 0.1 11/22/2010 0953    BMET    Component Value Date/Time   NA 136 07/07/2016 0545   K 4.0 07/07/2016 0545   CL 93 (L) 07/07/2016 0545   CO2 35 (H) 07/07/2016 0545   GLUCOSE 93 07/07/2016 0545   GLUCOSE 229 08/07/2008 0000   BUN 44 (H) 07/07/2016 0545   CREATININE 1.29 (H) 07/07/2016 0545   CREATININE 1.17 03/19/2016 1534   CALCIUM 10.1 07/07/2016 0545   GFRNONAA 51 (L) 07/07/2016 0545   GFRAA 59 (L) 07/07/2016 0545    INR    Component Value Date/Time   INR 1.78 07/07/2016 0545   INR 1.7 09/02/2010 1323     Intake/Output Summary (Last 24 hours) at 07/07/16 [id number] Last data filed at 07/07/16 0622  Gross per 24 hour  Intake              600 ml  Output              201 ml  Net              399 ml     Assessment:  82 y.o. male is here with left foot distal osteo. Needs angiogram but inr was elevated now has normalized and has fungal infection of right groin  Plan: Will place on  schedule tentatively for Wednesday as long as right groin continues to clear Continue diflucan and nystatin powder Local wound care left foot Needs aspirin and heparin drip as bridge with subtherapeutic inr    Derrian Rodak C. Donzetta Matters, MD [facility] Office: [phone] Pager: [phone]  07/07/2016 8:22 AM

## 2016-07-07 NOTE — Progress Notes (Signed)
Physical Therapy Treatment Patient Details Name: [name]. MRN: [id number] DOB: October 10, 1934 Today's Date: 07/07/2016    History of Present Illness Pt adm with Acute hypoxemic respiratory failure: Probably secondary to acute diastolic heart failure or pleural effusion. PMH - chf, avr, cabg, afib, pacer, lt toe amputations, pvd.    PT Comments    Pt is up to walk with some impulsive behavior and is not going to follow instructions to use RW today.  He is convinced he is safe and will need to be supervised a great deal to manage at home.  He did maintain O2 sats up to 91% with no O2 after having taken it off and being in his room without it.  Very limited distances of 35' x 2 due to safety with pt on IV pole, has demonstrated gait but needs AD.  WIll follow acutely for progression of mobility but likely needs 2 person assist to progress the distances further.  Follow Up Recommendations  Home health PT     Equipment Recommendations  Rolling walker with 5" wheels    Recommendations for Other Services       Precautions / Restrictions Precautions Precautions: Fall Restrictions Weight Bearing Restrictions: No    Mobility  Bed Mobility Overal bed mobility: Modified Independent             General bed mobility comments: Incr time  Transfers Overall transfer level: Needs assistance Equipment used: None Transfers: Sit to/from Stand;Stand Pivot Transfers Sit to Stand: Min guard Stand pivot transfers: Min guard       General transfer comment: pt objects to assistance for balance, states he cannot maintain balance with help  Ambulation/Gait Ambulation/Gait assistance: Min assist;Min guard Ambulation Distance (Feet): 70 Feet Assistive device: None Gait Pattern/deviations: Decreased stride length;Trunk flexed;Wide base of support;Drifts right/left Gait velocity: reduced then fast Gait velocity interpretation:  (unsafe and variable) General Gait Details: refused AD and  will barely let PT assist him   Stairs            Wheelchair Mobility    Modified Rankin (Stroke Patients Only)       Balance     Sitting balance-Leahy Scale: Good     Standing balance support: Single extremity supported Standing balance-Leahy Scale: Fair                      Cognition Arousal/Alertness: Awake/alert Behavior During Therapy: WFL for tasks assessed/performed Overall Cognitive Status: History of cognitive impairments - at baseline                 General Comments: short term memory loss and difficult to manage behavior    Exercises      General Comments        Pertinent Vitals/Pain Pain Assessment: No/denies pain    Home Living                      Prior Function            PT Goals (current goals can now be found in the care plan section) Acute Rehab PT Goals Patient Stated Goal: get home Progress towards PT goals: Progressing toward goals    Frequency    Min 3X/week      PT Plan Current plan remains appropriate    Co-evaluation             End of Session Equipment Utilized During Treatment: Gait belt (went without O2 with sats  from 99% down to 91% with no O2) Activity Tolerance: Patient tolerated treatment well Patient left: in bed;with call bell/phone within reach;with bed alarm set (has O2 off when PT entered and left off as he is satting 91%)     Time: [id number] PT Time Calculation (min) (ACUTE ONLY): 26 min  Charges:  $Gait Training: 8-22 mins $Therapeutic Activity: 8-22 mins                    G Codes:      Chad Cordial, PT MS Acute Rehab Dept. Number: [phone] [phone] and [facility] [phone]  07/07/2016, 1:42 PM

## 2016-07-07 NOTE — Progress Notes (Signed)
Inpatient Diabetes Program Recommendations  AACE/ADA: New Consensus Statement on Inpatient Glycemic Control (2015)  Target Ranges:  Prepandial:   less than 140 mg/dL      Peak postprandial:   less than 180 mg/dL (1-2 hours)      Critically ill patients:  140 - 180 mg/dL   Lab Results  Component Value Date   GLUCAP 131 (H) 07/07/2016   HGBA1C 7.4 (H) 07/06/2016    Review of Glycemic Control  Diabetes history: DM2 Outpatient Diabetes medications: Lantus 15 units QHS, Apidra 35 units tidwc Current orders for Inpatient glycemic control: Lantus 15 units QHS, Novolog 0-15 units tidwc + 15 units tidwc HgbA1C indicates good control at home.  Inpatient Diabetes Program Recommendations:    Agree with orders. Continue same.  Will follow while inpatient. Thank you. Lorenda Peck, RD, LDN, CDE Inpatient Diabetes Coordinator [phone]

## 2016-07-07 NOTE — Progress Notes (Signed)
ANTICOAGULATION CONSULT NOTE  Pharmacy Consult:  Heparin Indication:  History of Afib  Allergies  Allergen Reactions  . Potassium-Containing Compounds Anaphylaxis    IV--loss of conciousness  . Metoprolol Succinate Other (See Comments)    Extremely tired    Patient Measurements: Height: 5\' 10"  (177.8 cm) Weight: 194 lb 3.2 oz (88.1 kg) (Scale B) IBW/kg (Calculated) : 73 Heparin Dosing Weight: 88 kg  Vital Signs: Temp: 98.2 F (36.8 C) (01/15 1300) Temp Source: Oral (01/15 1300) BP: 110/48 (01/15 1300) Pulse Rate: 63 (01/15 1300)  Labs:  Recent Labs  07/05/16 0613 07/05/16 1534 07/06/16 0433 07/07/16 0545 07/07/16 1758  HGB 12.5*  --  12.3* 12.6*  --   HCT 38.9*  --  37.6* 38.6*  --   PLT 227  --  228 223  --   LABPROT 29.1*  --  25.3* 20.9*  --   INR 2.68  --  2.26 1.78  --   HEPARINUNFRC  --   --   --   --  0.24*  CREATININE 1.27* 1.25* 1.31* 1.29*  --     Estimated Creatinine Clearance: 51.9 mL/min (by C-G formula based on SCr of 1.29 mg/dL (H)).   Assessment: [name] on Coumadin PTA for history of Afib.  He also has a history of bioprosthetic AVR and LAA thrombus.  Coumadin has been on hold for angiogram currently scheduled on Wednesday. Now bridging with Heparin as INR dropped to 1.78 this morning.  First heparin below goal range at 0.24 units/mL. No bleeding noted. CBC stable.   Goal of Therapy:  Heparin level 0.3-0.7 units/ml Monitor platelets by anticoagulation protocol: Yes    Plan:  - Increase heparin gtt to 1400 units/h - Daily heparin level and CBC   Kahleah Crass D. Jaqlyn Gruenhagen, PharmD, BCPS Clinical Pharmacist Pager: [phone] 07/07/2016 6:39 PM

## 2016-07-07 NOTE — Progress Notes (Signed)
Nutrition Follow-up  DOCUMENTATION CODES:   Non-severe (moderate) malnutrition in context of chronic illness  INTERVENTION:  Continue Ensure Enlive po BID, each supplement provides 350 kcal and 20 grams of protein Continue 30 ml Pro-Stat BID, each dose provides 15 grams of protein and 100 kcal Continue Multivitamin with minerals daily  NUTRITION DIAGNOSIS:   Malnutrition related to chronic illness as evidenced by mild depletion of body fat, moderate depletions of muscle mass.  ongoing  GOAL:   Patient will meet greater than or equal to 90% of their needs  Being met  MONITOR:   PO intake, Labs, Skin, Weight trends, Supplement acceptance  REASON FOR ASSESSMENT:   Consult Wound healing  ASSESSMENT:   82 y.o. male with medical history significant of severe AS status post redo aVR (bioprosthetic valve), chronic diastolic heart failure, CAD status post CABG, chronic atrial fibrillation on Coumadin, history of left atrial thrombus noted on TEE in 7/12, history of peripheral vascular disease, left foot ulcer with osteomyelitis on Keflex was referred from the infectious disease MDs office for evaluation of hypoxia. Chest x-ray in the emergency room showed right-sided pleural effusion, and underlying infiltrate/atelectasis.   Pt slightly confused at time of visit. He doesn't know if he has been drinking Ensure supplements or taking Pro-Stat protein supplement. Per nursing note/manage orders pt has been taking Ensure and Pro-stat and eating 90-100% of meals. Weight stable since admission; up 3 lbs from 07/04/16.   Labs: glucose range 97 to 191 mg/dL, low chloride, elevated BUN  Diet Order:  Diet heart healthy/carb modified Room service appropriate? Yes; Fluid consistency: Thin  Skin:  Wound (see comment) (L foot wound on left amputated toe)  Last BM:  1/12  Height:   Ht Readings from Last 1 Encounters:  07/03/16 _0  (1.778 m)    Weight:   Wt Readings from Last 1  Encounters:  07/07/16 194 lb 3.2 oz (88.1 kg)    Ideal Body Weight:  75.45 kg  BMI:  Body mass index is 27.86 kg/m.  Estimated Nutritional Needs:   Kcal:  1900-2100  Protein:  105-115 grams  Fluid:  2.1 L/day  EDUCATION NEEDS:   No education needs identified at this time  [place], CSP, LDN Inpatient Clinical Dietitian Pager: [phone] After Hours Pager: [phone]

## 2016-07-07 NOTE — Progress Notes (Addendum)
No new orders. Will continue to monitor.  Johncharles Fusselman, RN    

## 2016-07-07 NOTE — Progress Notes (Signed)
Hypoglycemic Event  CBG: 50  Treatment: D50 IV 50 mL  Symptoms: None  Follow-up CBG: Time:1721 CBG Result:100  Possible Reasons for Event: Medication regimen: meal coverage 15units  Comments/MD notified:Dr Rizwan    [name], [name]

## 2016-07-08 ENCOUNTER — Encounter: Payer: Self-pay | Admitting: Surgery

## 2016-07-08 DIAGNOSIS — I509 Heart failure, unspecified: Secondary | ICD-10-CM

## 2016-07-08 DIAGNOSIS — E1169 Type 2 diabetes mellitus with other specified complication: Secondary | ICD-10-CM

## 2016-07-08 DIAGNOSIS — IMO0002 Reserved for concepts with insufficient information to code with codable children: Secondary | ICD-10-CM

## 2016-07-08 DIAGNOSIS — B9689 Other specified bacterial agents as the cause of diseases classified elsewhere: Secondary | ICD-10-CM

## 2016-07-08 DIAGNOSIS — Z9889 Other specified postprocedural states: Secondary | ICD-10-CM

## 2016-07-08 DIAGNOSIS — M8660 Other chronic osteomyelitis, unspecified site: Secondary | ICD-10-CM

## 2016-07-08 LAB — BASIC METABOLIC PANEL
Anion gap: 10 (ref 5–15)
BUN: 50 mg/dL — AB (ref 6–20)
CHLORIDE: 93 mmol/L — AB (ref 101–111)
CO2: 32 mmol/L (ref 22–32)
CREATININE: 1.28 mg/dL — AB (ref 0.61–1.24)
Calcium: 9.7 mg/dL (ref 8.9–10.3)
GFR calc Af Amer: 60 mL/min — ABNORMAL LOW (ref >60)
GFR calc non Af Amer: 52 mL/min — ABNORMAL LOW (ref >60)
Glucose, Bld: 121 mg/dL — ABNORMAL HIGH (ref 65–99)
Potassium: 4.9 mmol/L (ref 3.5–5.1)
Sodium: 135 mmol/L (ref 135–145)

## 2016-07-08 LAB — GLUCOSE, CAPILLARY
GLUCOSE-CAPILLARY: 134 mg/dL — AB (ref 65–99)
GLUCOSE-CAPILLARY: 306 mg/dL — AB (ref 65–99)
Glucose-Capillary: 102 mg/dL — ABNORMAL HIGH (ref 65–99)
Glucose-Capillary: 152 mg/dL — ABNORMAL HIGH (ref 65–99)
Glucose-Capillary: 38 mg/dL — CL (ref 65–99)

## 2016-07-08 LAB — CBC
HEMATOCRIT: 36.7 % — AB (ref 39.0–52.0)
HEMOGLOBIN: 11.8 g/dL — AB (ref 13.0–17.0)
MCH: 28.3 pg (ref 26.0–34.0)
MCHC: 32.2 g/dL (ref 30.0–36.0)
MCV: 88 fL (ref 78.0–100.0)
Platelets: 216 10*3/uL (ref 150–400)
RBC: 4.17 MIL/uL — ABNORMAL LOW (ref 4.22–5.81)
RDW: 14.6 % (ref 11.5–15.5)
WBC: 10.5 10*3/uL (ref 4.0–10.5)

## 2016-07-08 LAB — PROTIME-INR
INR: 1.65
PROTHROMBIN TIME: 19.7 s — AB (ref 11.4–15.2)

## 2016-07-08 LAB — HEPARIN LEVEL (UNFRACTIONATED)
Heparin Unfractionated: 0.38 IU/mL (ref 0.30–0.70)
Heparin Unfractionated: 0.35 IU/mL (ref 0.30–0.70)

## 2016-07-08 MED ORDER — DEXTROSE 50 % IV SOLN
INTRAVENOUS | Status: AC
  Administered 2016-07-08: 50 mL
  Filled 2016-07-08: qty 50

## 2016-07-08 MED ORDER — DEXTROSE 50 % IV SOLN: 1.0000 | Freq: Once | INTRAVENOUS | Status: AC

## 2016-07-08 MED ORDER — FUROSEMIDE 10 MG/ML IJ SOLN
40.0000 mg | Freq: Once | INTRAMUSCULAR | Status: AC
  Administered 2016-07-08: 40 mg via INTRAVENOUS
  Filled 2016-07-08: qty 4

## 2016-07-08 MED ORDER — HEPARIN (PORCINE) IN NACL 100-0.45 UNIT/ML-% IJ SOLN
1450.0000 [IU]/h | INTRAMUSCULAR | Status: DC
  Administered 2016-07-08 (×2): 1450 [IU]/h via INTRAVENOUS
  Filled 2016-07-08: qty 250

## 2016-07-08 MED ORDER — INSULIN ASPART 100 UNIT/ML ~~LOC~~ SOLN
12.0000 [IU] | Freq: Three times a day (TID) | SUBCUTANEOUS | Status: DC
  Administered 2016-07-09 – 2016-07-12 (×6): 12 [IU] via SUBCUTANEOUS

## 2016-07-08 MED ORDER — FUROSEMIDE 80 MG PO TABS
80.0000 mg | ORAL_TABLET | Freq: Every day | ORAL | Status: DC
  Administered 2016-07-08: 80 mg via ORAL
  Filled 2016-07-08: qty 1

## 2016-07-08 NOTE — Progress Notes (Signed)
PROGRESS NOTE    [name].  [id number] DOB: June 12, 1935 DOA: 07/03/2016  PCP: [name], MD   Brief Narrative:  Seth Boutilier. is a 82 y.o. male with medical history significant of dementia, poorly controlled DM, severe AS status post redo aVR (bioprosthetic valve), chronic diastolic heart failure, CAD status post CABG, chronic valvular atrial fibrillation on Coumadin, history of left atrial thrombus noted on TEE in 7/12, history of peripheral vascular disease s/p PTCA of R tibial artery and Left SFA stent placement,  amputation of L great toe and subsequent left foot ulcer with osteomyelitis on Keflex was referred from the infectious disease MDs office for evaluation of hypoxia.  Subjective: Confused, no complaints.   Assessment & Plan:   Principal Problem:   Acute diastolic CHF (congestive heart failure), acute hypoxic resp failure - started IV lasix- has diuresed well- pulse ox 97% on room air -switched Lasix to oral home dose of 80 in AM and 40 in PM but weight rising- will give an extra dose of  IV Lasix today and increase daily dose of Lasix - cont aldactone, Lisinopril -  Following  Cr, I and O, daily wieghts  Active Problems: Right Pleural effusion - likely due to above- thoracentesis 1/12 with removal of 1.8 L yellow fluid - per Light's criteria, fluid is transudative - no further work up needed    Diabetic osteomyelitis  -  Has been on Keflex but wound continues to be infected which is why I have re-consulted ID and Vascular sugery -  will have Angiogram on Thursday once right groin candida infection clears - ID has discontinued antibiotics for now - wound care consulted  Candida dermatitis of groin - Fluconazole started by ID- add Nystatin powder- cannot have angio until this clears    Chronic atrial fibrillation - CHA2DS2-VASc Score at least 6 - not on rate controlling agent- holding Coumain for angiogram - Heparin started  H/o atrial thrombus -  Coumadin- switching to Heparin as INR is subtherapeutic     S/P AVR (aortic valve replacement) - bioprosthetic    PACEMAKER, PERMANENT/   Complete heart block   CAD s/p CABG - not on ASA- cont statin    Malnutrition of moderate degree - supplements ordered  Dementia - following for behavioral disturbances   DVT prophylaxis: Coumadin Code Status: Full code Family Communication: wife Disposition Plan: home when stable Consultants:   IR Procedures:   Thoracentesis  Antimicrobials:  Anti-infectives    Start     Dose/Rate Route Frequency Ordered Stop   07/05/16 1330  fluconazole (DIFLUCAN) tablet 100 mg     100 mg Oral Daily 07/05/16 1231     07/04/16 2200  vancomycin (VANCOCIN) 1,750 mg in sodium chloride 0.9 % 500 mL IVPB  Status:  Discontinued     1,750 mg 250 mL/hr over 120 Minutes Intravenous Every 24 hours 07/04/16 2132 07/05/16 1231   07/03/16 1930  cephALEXin (KEFLEX) capsule 500 mg  Status:  Discontinued     500 mg Oral Every 8 hours 07/03/16 1835 07/05/16 1458       Objective: Vitals:   07/07/16 1300 07/07/16 2026 07/08/16 0653 07/08/16 0832  BP: (!) 110/48 (!) 101/38 109/71   Pulse: 63 60 78   Resp: 18 18 16    Temp: 98.2 F (36.8 C) 98.3 F (36.8 C) 97.7 F (36.5 C)   TempSrc: Oral Oral Oral   SpO2: 96% 93% 93%   Weight:   88.7 kg (195  lb 9.6 oz) 89.2 kg (196 lb 11.2 oz)  Height:        Intake/Output Summary (Last 24 hours) at 07/08/16 1143 Last data filed at 07/08/16 1103  Gross per 24 hour  Intake          1042.45 ml  Output             1700 ml  Net          -657.55 ml   Filed Weights   07/07/16 0621 07/08/16 0653 07/08/16 0832  Weight: 88.1 kg (194 lb 3.2 oz) 88.7 kg (195 lb 9.6 oz) 89.2 kg (196 lb 11.2 oz)    Examination: General exam: Appears comfortable  HEENT: PERRLA, oral mucosa moist, no sclera icterus or thrush Respiratory system: CTA b/l - Respiratory effort normal. Cardiovascular system: S1 & S2 heard, RRR.  No murmurs    Gastrointestinal system: Abdomen soft, non-tender, nondistended. Normal bowel sound. No organomegaly Central nervous system: Alert and oriented. No focal neurological deficits. Extremities: No cyanosis, clubbing or edema Skin: wound on left foot at head of L metatarsal with exposed bone Psychiatry:  Mood & affect appropriate.     Data Reviewed: I have personally reviewed following labs and imaging studies  CBC:  Recent Labs Lab 07/03/16 1540 07/05/16 0613 07/06/16 0433 07/07/16 0545 07/08/16 0304  WBC 8.3 11.4* 12.9* 11.2* 10.5  NEUTROABS 5.9  --  9.6*  --   --   HGB 12.0* 12.5* 12.3* 12.6* 11.8*  HCT 37.3* 38.9* 37.6* 38.6* 36.7*  MCV 87.1 86.8 87.9 88.3 88.0  PLT 203 227 228 223 [id number]   Basic Metabolic Panel:  Recent Labs Lab 07/05/16 0613 07/05/16 1534 07/06/16 0433 07/07/16 0545 07/08/16 0304  NA 136 137 134* 136 135  K 3.8 3.6 3.9 4.0 4.9  CL 95* 95* 92* 93* 93*  CO2 34* 32 33* 35* 32  GLUCOSE 207* 81 114* 93 121*  BUN 29* 36* 44* 44* 50*  CREATININE 1.27* 1.25* 1.31* 1.29* 1.28*  CALCIUM 9.8 9.9 10.0 10.1 9.7   GFR: Estimated Creatinine Clearance: 52.6 mL/min (by C-G formula based on SCr of 1.28 mg/dL (H)). Liver Function Tests:  Recent Labs Lab 07/04/16 0502  AST 29  ALT 25  ALKPHOS 129*  BILITOT 0.8  PROT 6.8  ALBUMIN 2.5*   No results for input(s): LIPASE, AMYLASE in the last 168 hours. No results for input(s): AMMONIA in the last 168 hours. Coagulation Profile:  Recent Labs Lab 07/04/16 0502 07/05/16 [id number] 07/06/16 0433 07/07/16 0545 07/08/16 0304  INR 3.45 2.68 2.26 1.78 1.65   Cardiac Enzymes: No results for input(s): CKTOTAL, CKMB, CKMBINDEX, TROPONINI in the last 168 hours. BNP (last 3 results) No results for input(s): PROBNP in the last 8760 hours. HbA1C:  Recent Labs  07/06/16 0433  HGBA1C 7.4*   CBG:  Recent Labs Lab 07/07/16 1140 07/07/16 1644 07/07/16 1721 07/07/16 2146 07/08/16 0643  GLUCAP 131* 50* 100*  129* 102*   Lipid Profile: No results for input(s): CHOL, HDL, LDLCALC, TRIG, CHOLHDL, LDLDIRECT in the last 72 hours. Thyroid Function Tests: No results for input(s): TSH, T4TOTAL, FREET4, T3FREE, THYROIDAB in the last 72 hours. Anemia Panel: No results for input(s): VITAMINB12, FOLATE, FERRITIN, TIBC, IRON, RETICCTPCT in the last 72 hours. Urine analysis:    Component Value Date/Time   COLORURINE YELLOW 01/14/2011 [place] 01/14/2011 1149   LABSPEC 1.011 01/14/2011 1149   PHURINE 7.0 01/14/2011 1149   GLUCOSEU NEGATIVE 01/14/2011 1149  HGBUR NEGATIVE 01/14/2011 [place] 01/14/2011 1149   KETONESUR NEGATIVE 01/14/2011 1149   PROTEINUR NEGATIVE 01/14/2011 1149   UROBILINOGEN 1.0 01/14/2011 1149   NITRITE NEGATIVE 01/14/2011 1149   LEUKOCYTESUR TRACE (A) 01/14/2011 1149   Sepsis Labs: @LABRCNTIP (procalcitonin:4,lacticidven:4) ) Recent Results (from the past 240 hour(s))  Culture, blood (routine x 2)     Status: Abnormal   Collection Time: 07/03/16  9:40 PM  Result Value Ref Range Status   Specimen Description BLOOD RIGHT ARM  Final   Special Requests BOTTLES DRAWN AEROBIC AND ANAEROBIC 5ML  Final   Culture  Setup Time   Final    GRAM POSITIVE COCCI IN CLUSTERS IN BOTH AEROBIC AND ANAEROBIC BOTTLES CRITICAL RESULT CALLED TO, READ BACK BY AND VERIFIED WITH: T. DANG, PHARMD, 07/04/16 2117 BY J. FUDESCO    Culture (A)  Final    STAPHYLOCOCCUS SPECIES (COAGULASE NEGATIVE) THE SIGNIFICANCE OF ISOLATING THIS ORGANISM FROM A SINGLE SET OF BLOOD CULTURES WHEN MULTIPLE SETS ARE DRAWN IS UNCERTAIN. PLEASE NOTIFY THE MICROBIOLOGY DEPARTMENT WITHIN ONE WEEK IF SPECIATION AND SENSITIVITIES ARE REQUIRED.    Report Status 07/06/2016 FINAL  Final  Blood Culture ID Panel (Reflexed)     Status: Abnormal   Collection Time: 07/03/16  9:40 PM  Result Value Ref Range Status   Enterococcus species NOT DETECTED NOT DETECTED Final   Listeria monocytogenes NOT  DETECTED NOT DETECTED Final   Staphylococcus species DETECTED (A) NOT DETECTED Final    Comment: CRITICAL RESULT CALLED TO, READ BACK BY AND VERIFIED WITH: T. DANG PHARMD 07/04/16 2117 BY J FUDESCO    Staphylococcus aureus NOT DETECTED NOT DETECTED Final   Methicillin resistance DETECTED (A) NOT DETECTED Final    Comment: CRITICAL RESULT CALLED TO, READ BACK BY AND VERIFIED WITH: T. DANG PHARMD 07/04/16 2117 BY J. FUDESCO    Streptococcus species NOT DETECTED NOT DETECTED Final   Streptococcus agalactiae NOT DETECTED NOT DETECTED Final   Streptococcus pneumoniae NOT DETECTED NOT DETECTED Final   Streptococcus pyogenes NOT DETECTED NOT DETECTED Final   Acinetobacter baumannii NOT DETECTED NOT DETECTED Final   Enterobacteriaceae species NOT DETECTED NOT DETECTED Final   Enterobacter cloacae complex NOT DETECTED NOT DETECTED Final   Escherichia coli NOT DETECTED NOT DETECTED Final   Klebsiella oxytoca NOT DETECTED NOT DETECTED Final   Klebsiella pneumoniae NOT DETECTED NOT DETECTED Final   Proteus species NOT DETECTED NOT DETECTED Final   Serratia marcescens NOT DETECTED NOT DETECTED Final   Haemophilus influenzae NOT DETECTED NOT DETECTED Final   Neisseria meningitidis NOT DETECTED NOT DETECTED Final   Pseudomonas aeruginosa NOT DETECTED NOT DETECTED Final   Candida albicans NOT DETECTED NOT DETECTED Final   Candida glabrata NOT DETECTED NOT DETECTED Final   Candida krusei NOT DETECTED NOT DETECTED Final   Candida parapsilosis NOT DETECTED NOT DETECTED Final   Candida tropicalis NOT DETECTED NOT DETECTED Final  Culture, blood (routine x 2)     Status: None (Preliminary result)   Collection Time: 07/03/16  9:46 PM  Result Value Ref Range Status   Specimen Description BLOOD LEFT ARM  Final   Special Requests BOTTLES DRAWN AEROBIC AND ANAEROBIC 5ML  Final   Culture NO GROWTH 3 DAYS  Final   Report Status PENDING  Incomplete  Culture, body fluid-bottle     Status: None (Preliminary  result)   Collection Time: 07/04/16 10:34 AM  Result Value Ref Range Status   Specimen Description FLUID PLEURAL RIGHT  Final  Special Requests NONE  Final   Culture NO GROWTH 3 DAYS  Final   Report Status PENDING  Incomplete  Gram stain     Status: None   Collection Time: 07/04/16 10:34 AM  Result Value Ref Range Status   Specimen Description FLUID PLEURAL RIGHT  Final   Special Requests NONE  Final   Gram Stain   Final    RARE WBC PRESENT, PREDOMINANTLY MONONUCLEAR NO ORGANISMS SEEN    Report Status 07/04/2016 FINAL  Final  Blood Cultures x 2 sites     Status: None (Preliminary result)   Collection Time: 07/05/16  3:34 PM  Result Value Ref Range Status   Specimen Description BLOOD RIGHT ARM  Final   Special Requests BOTTLES DRAWN AEROBIC AND ANAEROBIC 4CC  Final   Culture NO GROWTH 2 DAYS  Final   Report Status PENDING  Incomplete  Blood Cultures x 2 sites     Status: None (Preliminary result)   Collection Time: 07/05/16  3:44 PM  Result Value Ref Range Status   Specimen Description BLOOD LEFT ARM  Final   Special Requests BOTTLES DRAWN AEROBIC AND ANAEROBIC 5CC  Final   Culture NO GROWTH 2 DAYS  Final   Report Status PENDING  Incomplete         Radiology Studies: No results found.    Scheduled Meds: . collagenase   Topical Daily  . donepezil  10 mg Oral QHS  . ezetimibe-simvastatin  1 tablet Oral QHS  . feeding supplement (ENSURE ENLIVE)  237 mL Oral Q24H  . feeding supplement (PRO-STAT SUGAR FREE 64)  30 mL Oral BID  . fluconazole  100 mg Oral Daily  . furosemide  80 mg Oral Q0600  . furosemide  80 mg Oral Daily  . insulin aspart  0-15 Units Subcutaneous TID WC  . insulin aspart  15 Units Subcutaneous TID WC  . insulin glargine  13 Units Subcutaneous QHS  . lisinopril  10 mg Oral Daily  . multivitamin with minerals  1 tablet Oral Daily  . nystatin   Topical TID  . sodium chloride flush  3 mL Intravenous Q12H  . spironolactone  25 mg Oral q1800    Continuous Infusions: . heparin 1,400 Units/hr (07/08/16 0137)     LOS: 5 days    Time spent in minutes: Seth Smiths, MD [facility] Pager: [email or web address] Password [facility] 07/08/2016, 11:43 AM

## 2016-07-08 NOTE — Progress Notes (Signed)
Patient with no complaints or concerns during 7pm - 7am shift.  Mykaylah Ballman, RN 

## 2016-07-08 NOTE — Progress Notes (Signed)
Hypoglycemic Event  CBG: 38  Treatment: D50 IV 50 mL  Symptoms: Sweaty  Follow-up CBG: Time:1744 CBG Result:134  Possible Reasons for Event: Medication regimen: novolog 15  Comments/MD notified: Dr. Wynelle Cleveland notified    Phylliss Bob P

## 2016-07-08 NOTE — Progress Notes (Signed)
Physical Therapy Treatment Patient Details Name: [name]. MRN: [name] DOB: 1935-04-10 Today's Date: 08-05-16    History of Present Illness Pt adm with Acute hypoxemic respiratory failure: Probably secondary to acute diastolic heart failure or pleural effusion. PMH - chf, avr, cabg, afib, pacer, lt toe amputations, pvd.    PT Comments    Pt making steady progress. Pt will not use assistive device at this time and doubt he would use at home. Expect he will be a "furniture walker" at home.  Follow Up Recommendations  Home health PT;Supervision - Intermittent     Equipment Recommendations  None recommended by PT    Recommendations for Other Services       Precautions / Restrictions Precautions Precautions: Fall Restrictions Weight Bearing Restrictions: No    Mobility  Bed Mobility Overal bed mobility: Modified Independent             General bed mobility comments: Incr time  Transfers Overall transfer level: Needs assistance Equipment used: None Transfers: Sit to/from Stand Sit to Stand: Supervision         General transfer comment: assist for safety  Ambulation/Gait Ambulation/Gait assistance: Min guard Ambulation Distance (Feet): 220 Feet Assistive device: None (occasionally reached for rail) Gait Pattern/deviations: Decreased stride length;Step-through pattern;Drifts right/left;Wide base of support Gait velocity: decr Gait velocity interpretation: Below normal speed for age/gender General Gait Details: Slightly unsteady but no overt loss of balance. Used wall rail occasionally. SpO2 89% on RA after amb. Pt refuses use of assistive device   Stairs            Wheelchair Mobility    Modified Rankin (Stroke Patients Only)       Balance Overall balance assessment: Needs assistance Sitting-balance support: No upper extremity supported;Feet supported Sitting balance-Leahy Scale: Good     Standing balance support: No upper  extremity supported Standing balance-Leahy Scale: Fair                      Cognition Arousal/Alertness: Awake/alert Behavior During Therapy: WFL for tasks assessed/performed Overall Cognitive Status: History of cognitive impairments - at baseline                      Exercises      General Comments        Pertinent Vitals/Pain Pain Assessment: No/denies pain    Home Living                      Prior Function            PT Goals (current goals can now be found in the care plan section) Progress towards PT goals: Progressing toward goals    Frequency    Min 3X/week      PT Plan Current plan remains appropriate    Co-evaluation             End of Session Equipment Utilized During Treatment: Gait belt Activity Tolerance: Patient tolerated treatment well Patient left: in bed;with call bell/phone within reach;with bed alarm set (has O2 off when PT entered and left off as he is satting 91%)     Time: 1431-1446 PT Time Calculation (min) (ACUTE ONLY): 15 min  Charges:  $Gait Training: 8-22 mins                    G CodesShary Decamp [facility] 2016-08-05, 3:23 PM [employer] PT [phone]

## 2016-07-08 NOTE — Progress Notes (Signed)
ANTICOAGULATION CONSULT NOTE - FOLLOW UP  Pharmacy Consult:  Heparin Indication:  History of Afib  Allergies  Allergen Reactions  . Potassium-Containing Compounds Anaphylaxis    IV--loss of conciousness  . Metoprolol Succinate Other (See Comments)    Extremely tired    Patient Measurements: Height: 5\' 10"  (177.8 cm) Weight: 196 lb 11.2 oz (89.2 kg) IBW/kg (Calculated) : 73 Heparin Dosing Weight: 88 kg  Vital Signs: Temp: 97.8 F (36.6 C) (01/16 1153) Temp Source: Oral (01/16 1153) BP: 106/45 (01/16 1153) Pulse Rate: 58 (01/16 1153)  Labs:  Recent Labs  07/06/16 0433 07/07/16 0545 07/07/16 1758 07/08/16 0304 07/08/16 1125  HGB 12.3* 12.6*  --  11.8*  --   HCT 37.6* 38.6*  --  36.7*  --   PLT 228 223  --  216  --   LABPROT 25.3* 20.9*  --  19.7*  --   INR 2.26 1.78  --  1.65  --   HEPARINUNFRC  --   --  0.24* 0.38 0.35  CREATININE 1.31* 1.29*  --  1.28*  --     Estimated Creatinine Clearance: 52.6 mL/min (by C-G formula based on SCr of 1.28 mg/dL (H)).    Assessment: 33 YOM on Coumadin PTA for history of Afib.  He also has a history of bioprosthetic AVR and LAA thrombus.  Coumadin has been on hold for angiogram currently scheduled on Thursday.  INR decreased to sub-therapeutic level and IV heparin bridge added on 07/07/16.  Heparin level is therapeutic; no bleeding reported.   Goal of Therapy:  Heparin level 0.3-0.7 units/ml Monitor platelets by anticoagulation protocol: Yes    Plan:  - Increase heparin gtt slightly to 1450 units/hr - Daily heparin level and CBC   Osmani Kersten D. Mina Marble, PharmD, BCPS Pager:  [phone] 07/08/2016, 1:52 PM

## 2016-07-08 NOTE — Progress Notes (Signed)
[facility]   Reason for visit: Follow up on chronic diabetic osteomyelitis  Interval History: vascular studies tomorrow, has had elevated INR; no complaints  Physical Exam: Constitutional:  Vitals:   07/07/16 2026 07/08/16 0653  BP: (!) 101/38 109/71  Pulse: 60 78  Resp: 18 16  Temp: 98.3 F (36.8 C) 97.7 F (36.5 C)   patient appears in NAD Respiratory: Normal respiratory effort; CTA B Cardiovascular: RRR MS: no change in ulcer  Review of Systems: Constitutional: negative for fatigue and malaise Gastrointestinal: negative for diarrhea  Lab Results  Component Value Date   WBC 10.5 07/08/2016   HGB 11.8 (L) 07/08/2016   HCT 36.7 (L) 07/08/2016   MCV 88.0 07/08/2016   PLT 216 07/08/2016    Lab Results  Component Value Date   CREATININE 1.28 (H) 07/08/2016   BUN 50 (H) 07/08/2016   NA 135 07/08/2016   K 4.9 07/08/2016   CL 93 (L) 07/08/2016   CO2 32 07/08/2016    Lab Results  Component Value Date   ALT 25 07/04/2016   AST 29 07/04/2016   ALKPHOS 129 (H) 07/04/2016     Microbiology: Recent Results (from the past 240 hour(s))  Culture, blood (routine x 2)     Status: Abnormal   Collection Time: 07/03/16  9:40 PM  Result Value Ref Range Status   Specimen Description BLOOD RIGHT ARM  Final   Special Requests BOTTLES DRAWN AEROBIC AND ANAEROBIC 5ML  Final   Culture  Setup Time   Final    GRAM POSITIVE COCCI IN CLUSTERS IN BOTH AEROBIC AND ANAEROBIC BOTTLES CRITICAL RESULT CALLED TO, READ BACK BY AND VERIFIED WITH: T. DANG, PHARMD, 07/04/16 2117 BY J. FUDESCO    Culture (A)  Final    STAPHYLOCOCCUS SPECIES (COAGULASE NEGATIVE) THE SIGNIFICANCE OF ISOLATING THIS ORGANISM FROM A SINGLE SET OF BLOOD CULTURES WHEN MULTIPLE SETS ARE DRAWN IS UNCERTAIN. PLEASE NOTIFY THE MICROBIOLOGY DEPARTMENT WITHIN ONE WEEK IF SPECIATION AND SENSITIVITIES ARE REQUIRED.    Report Status 07/06/2016 FINAL  Final  Blood Culture ID Panel (Reflexed)      Status: Abnormal   Collection Time: 07/03/16  9:40 PM  Result Value Ref Range Status   Enterococcus species NOT DETECTED NOT DETECTED Final   Listeria monocytogenes NOT DETECTED NOT DETECTED Final   Staphylococcus species DETECTED (A) NOT DETECTED Final    Comment: CRITICAL RESULT CALLED TO, READ BACK BY AND VERIFIED WITH: T. DANG PHARMD 07/04/16 2117 BY J FUDESCO    Staphylococcus aureus NOT DETECTED NOT DETECTED Final   Methicillin resistance DETECTED (A) NOT DETECTED Final    Comment: CRITICAL RESULT CALLED TO, READ BACK BY AND VERIFIED WITH: T. DANG PHARMD 07/04/16 2117 BY J. FUDESCO    Streptococcus species NOT DETECTED NOT DETECTED Final   Streptococcus agalactiae NOT DETECTED NOT DETECTED Final   Streptococcus pneumoniae NOT DETECTED NOT DETECTED Final   Streptococcus pyogenes NOT DETECTED NOT DETECTED Final   Acinetobacter baumannii NOT DETECTED NOT DETECTED Final   Enterobacteriaceae species NOT DETECTED NOT DETECTED Final   Enterobacter cloacae complex NOT DETECTED NOT DETECTED Final   Escherichia coli NOT DETECTED NOT DETECTED Final   Klebsiella oxytoca NOT DETECTED NOT DETECTED Final   Klebsiella pneumoniae NOT DETECTED NOT DETECTED Final   Proteus species NOT DETECTED NOT DETECTED Final   Serratia marcescens NOT DETECTED NOT DETECTED Final   Haemophilus influenzae NOT DETECTED NOT DETECTED Final   Neisseria meningitidis NOT DETECTED NOT DETECTED  Final   Pseudomonas aeruginosa NOT DETECTED NOT DETECTED Final   Candida albicans NOT DETECTED NOT DETECTED Final   Candida glabrata NOT DETECTED NOT DETECTED Final   Candida krusei NOT DETECTED NOT DETECTED Final   Candida parapsilosis NOT DETECTED NOT DETECTED Final   Candida tropicalis NOT DETECTED NOT DETECTED Final  Culture, blood (routine x 2)     Status: None (Preliminary result)   Collection Time: 07/03/16  9:46 PM  Result Value Ref Range Status   Specimen Description BLOOD LEFT ARM  Final   Special Requests BOTTLES  DRAWN AEROBIC AND ANAEROBIC 5ML  Final   Culture NO GROWTH 3 DAYS  Final   Report Status PENDING  Incomplete  Culture, body fluid-bottle     Status: None (Preliminary result)   Collection Time: 07/04/16 10:34 AM  Result Value Ref Range Status   Specimen Description FLUID PLEURAL RIGHT  Final   Special Requests NONE  Final   Culture NO GROWTH 3 DAYS  Final   Report Status PENDING  Incomplete  Gram stain     Status: None   Collection Time: 07/04/16 10:34 AM  Result Value Ref Range Status   Specimen Description FLUID PLEURAL RIGHT  Final   Special Requests NONE  Final   Gram Stain   Final    RARE WBC PRESENT, PREDOMINANTLY MONONUCLEAR NO ORGANISMS SEEN    Report Status 07/04/2016 FINAL  Final  Blood Cultures x 2 sites     Status: None (Preliminary result)   Collection Time: 07/05/16  3:34 PM  Result Value Ref Range Status   Specimen Description BLOOD RIGHT ARM  Final   Special Requests BOTTLES DRAWN AEROBIC AND ANAEROBIC 4CC  Final   Culture NO GROWTH 2 DAYS  Final   Report Status PENDING  Incomplete  Blood Cultures x 2 sites     Status: None (Preliminary result)   Collection Time: 07/05/16  3:44 PM  Result Value Ref Range Status   Specimen Description BLOOD LEFT ARM  Final   Special Requests BOTTLES DRAWN AEROBIC AND ANAEROBIC 5CC  Final   Culture NO GROWTH 2 DAYS  Final   Report Status PENDING  Incomplete    Impression/Plan:  1. Osteomyelitis - continue off of antibiotics.  Vascular studies to determine if salvageable or amputation needed.  If not, ideally will get bone biopsy.   2.  CHF - improved.  Had thoracentesis.

## 2016-07-08 NOTE — Progress Notes (Signed)
ANTICOAGULATION CONSULT NOTE - Follow Up Consult  Pharmacy Consult for heparin Indication: atrial fibrillation  Labs:  Recent Labs  07/05/16 1534 07/06/16 0433 07/07/16 0545 07/07/16 1758 07/08/16 0304  HGB  --  12.3* 12.6*  --  11.8*  HCT  --  37.6* 38.6*  --  36.7*  PLT  --  228 223  --  216  LABPROT  --  25.3* 20.9*  --  19.7*  INR  --  2.26 1.78  --  1.65  HEPARINUNFRC  --   --   --  0.24* 0.38  CREATININE 1.25* 1.31* 1.29*  --   --     Assessment/Plan:  82yo male therapeutic on heparin after rate change. Will continue gtt at current rate and confirm stable with additional level.   Wynona Neat, PharmD, BCPS  07/08/2016,4:29 AM

## 2016-07-08 NOTE — Progress Notes (Signed)
  Progress Note    07/08/2016 10:34 AM * No surgery date entered *  Subjective: no acute issues  Vitals:   07/07/16 2026 07/08/16 0653  BP: (!) 101/38 109/71  Pulse: 60 78  Resp: 18 16  Temp: 98.3 F (36.8 C) 97.7 F (36.5 C)    Physical Exam: Awake and alert Palpable popliteal pulses Left foot dressing cdi Right groin intertrigo clearing  CBC    Component Value Date/Time   WBC 10.5 07/08/2016 0304   RBC 4.17 (L) 07/08/2016 0304   HGB 11.8 (L) 07/08/2016 0304   HGB 14.1 11/22/2010 0953   HCT 36.7 (L) 07/08/2016 0304   HCT 42.6 11/22/2010 0953   PLT 216 07/08/2016 0304   PLT 223 11/22/2010 0953   MCV 88.0 07/08/2016 0304   MCV 82 11/22/2010 0953   MCH 28.3 07/08/2016 0304   MCHC 32.2 07/08/2016 0304   RDW 14.6 07/08/2016 0304   RDW 16.3 (H) 11/22/2010 0953   LYMPHSABS 2.1 07/06/2016 0433   LYMPHSABS 2.4 11/22/2010 0953   MONOABS 1.0 07/06/2016 0433   EOSABS 0.2 07/06/2016 0433   EOSABS 0.4 11/22/2010 0953   BASOSABS 0.0 07/06/2016 0433   BASOSABS 0.1 11/22/2010 0953    BMET    Component Value Date/Time   NA 135 07/08/2016 0304   K 4.9 07/08/2016 0304   CL 93 (L) 07/08/2016 0304   CO2 32 07/08/2016 0304   GLUCOSE 121 (H) 07/08/2016 0304   GLUCOSE 229 08/07/2008 0000   BUN 50 (H) 07/08/2016 0304   CREATININE 1.28 (H) 07/08/2016 0304   CREATININE 1.17 03/19/2016 1534   CALCIUM 9.7 07/08/2016 0304   GFRNONAA 52 (L) 07/08/2016 0304   GFRAA 60 (L) 07/08/2016 0304    INR    Component Value Date/Time   INR 1.65 07/08/2016 0304   INR 1.7 09/02/2010 1323     Intake/Output Summary (Last 24 hours) at 07/08/16 1034 Last data filed at 07/08/16 0905  Gross per 24 hour  Intake           986.45 ml  Output             1500 ml  Net          -513.55 ml     Assessment:  82 y.o. male is here with osteo of left foot and will need angiogram, inr normalized now holding coumadin and groin infection is clearing  Plan: Angiogram moved to Thursday with Dr.  Bridgett Larsson. Further need for bypass or amputation pending results.    Bera Pinela C. Donzetta Matters, MD [facility] Office: [phone] Pager: [phone]  07/08/2016 10:34 AM

## 2016-07-09 ENCOUNTER — Inpatient Hospital Stay (HOSPITAL_COMMUNITY): Payer: Medicare Other

## 2016-07-09 LAB — CULTURE, BODY FLUID W GRAM STAIN -BOTTLE: Culture: NO GROWTH

## 2016-07-09 LAB — GLUCOSE, CAPILLARY
GLUCOSE-CAPILLARY: 105 mg/dL — AB (ref 65–99)
GLUCOSE-CAPILLARY: 98 mg/dL (ref 65–99)
GLUCOSE-CAPILLARY: 90 mg/dL (ref 65–99)
Glucose-Capillary: 214 mg/dL — ABNORMAL HIGH (ref 65–99)

## 2016-07-09 LAB — PROTIME-INR
INR: 1.49
PROTHROMBIN TIME: 18.1 s — AB (ref 11.4–15.2)

## 2016-07-09 LAB — CBC
HEMATOCRIT: 36.3 % — AB (ref 39.0–52.0)
HEMOGLOBIN: 11.7 g/dL — AB (ref 13.0–17.0)
MCH: 28.3 pg (ref 26.0–34.0)
MCHC: 32.2 g/dL (ref 30.0–36.0)
MCV: 87.7 fL (ref 78.0–100.0)
Platelets: 211 10*3/uL (ref 150–400)
RBC: 4.14 MIL/uL — AB (ref 4.22–5.81)
RDW: 14.8 % (ref 11.5–15.5)
WBC: 10.7 10*3/uL — ABNORMAL HIGH (ref 4.0–10.5)

## 2016-07-09 LAB — BASIC METABOLIC PANEL
Anion gap: 11 (ref 5–15)
BUN: 41 mg/dL — AB (ref 6–20)
CHLORIDE: 95 mmol/L — AB (ref 101–111)
CO2: 29 mmol/L (ref 22–32)
Calcium: 9.9 mg/dL (ref 8.9–10.3)
Creatinine, Ser: 1.21 mg/dL (ref 0.61–1.24)
GFR calc Af Amer: >60 mL/min (ref >60)
GFR calc non Af Amer: 55 mL/min — ABNORMAL LOW (ref >60)
Glucose, Bld: 136 mg/dL — ABNORMAL HIGH (ref 65–99)
POTASSIUM: 4.2 mmol/L (ref 3.5–5.1)
Sodium: 135 mmol/L (ref 135–145)

## 2016-07-09 LAB — CULTURE, BLOOD (ROUTINE X 2): CULTURE: NO GROWTH

## 2016-07-09 LAB — HEPARIN LEVEL (UNFRACTIONATED): Heparin Unfractionated: 0.64 IU/mL (ref 0.30–0.70)

## 2016-07-09 LAB — CULTURE, BODY FLUID-BOTTLE

## 2016-07-09 MED ORDER — FUROSEMIDE 10 MG/ML IJ SOLN
40.0000 mg | Freq: Three times a day (TID) | INTRAMUSCULAR | Status: DC
  Administered 2016-07-09 – 2016-07-13 (×10): 40 mg via INTRAVENOUS
  Filled 2016-07-09 (×12): qty 4

## 2016-07-09 MED ORDER — FUROSEMIDE 10 MG/ML IJ SOLN: 40.0000 mg | Freq: Three times a day (TID) | INTRAMUSCULAR | Status: DC | PRN

## 2016-07-09 MED ORDER — FUROSEMIDE 10 MG/ML IJ SOLN: 40.0000 mg | Freq: Two times a day (BID) | INTRAMUSCULAR | Status: DC

## 2016-07-09 MED ORDER — HEPARIN (PORCINE) IN NACL 100-0.45 UNIT/ML-% IJ SOLN
1400.0000 [IU]/h | INTRAMUSCULAR | Status: DC
  Administered 2016-07-09 – 2016-07-10 (×2): 1400 [IU]/h via INTRAVENOUS
  Filled 2016-07-09 (×2): qty 250

## 2016-07-09 NOTE — Progress Notes (Signed)
Physical Therapy Treatment Patient Details Name: [name]. MRN: [name] DOB: 22-Aug-1934 Today's Date: July 27, 2016    History of Present Illness Pt adm with Acute hypoxemic respiratory failure: Probably secondary to acute diastolic heart failure or pleural effusion. PMH - chf, avr, cabg, afib, pacer, lt toe amputations, pvd.    PT Comments    Pt making steady progress.  Follow Up Recommendations  Home health PT;Supervision - Intermittent     Equipment Recommendations  None recommended by PT    Recommendations for Other Services       Precautions / Restrictions Precautions Precautions: Fall Restrictions Weight Bearing Restrictions: No    Mobility  Bed Mobility Overal bed mobility: Modified Independent             General bed mobility comments: Incr time  Transfers Overall transfer level: Needs assistance Equipment used: None Transfers: Sit to/from Stand Sit to Stand: Supervision         General transfer comment: assist for safety  Ambulation/Gait Ambulation/Gait assistance: Min guard   Assistive device: None (pushed IV pole at times) Gait Pattern/deviations: Decreased stride length;Step-through pattern;Drifts right/left;Wide base of support Gait velocity: decr Gait velocity interpretation: Below normal speed for age/gender General Gait Details: Slightly unsteady but no overt loss of balance   Stairs            Wheelchair Mobility    Modified Rankin (Stroke Patients Only)       Balance Overall balance assessment: Needs assistance Sitting-balance support: No upper extremity supported;Feet supported Sitting balance-Leahy Scale: Good     Standing balance support: No upper extremity supported Standing balance-Leahy Scale: Fair                      Cognition Arousal/Alertness: Awake/alert Behavior During Therapy: WFL for tasks assessed/performed Overall Cognitive Status: History of cognitive impairments - at baseline                      Exercises      General Comments        Pertinent Vitals/Pain Pain Assessment: No/denies pain    Home Living                      Prior Function            PT Goals (current goals can now be found in the care plan section) Progress towards PT goals: Progressing toward goals    Frequency    Min 3X/week      PT Plan Current plan remains appropriate    Co-evaluation             End of Session   Activity Tolerance: Patient tolerated treatment well Patient left: in bed;with call bell/phone within reach;with nursing/sitter in room (has O2 off when PT entered and left off as he is satting 91%)     Time: [id number] PT Time Calculation (min) (ACUTE ONLY): 14 min  Charges:  $Gait Training: 8-22 mins                    G CodesShary Decamp [facility] 07/27/16, 12:23 PM [employer] PT [phone]

## 2016-07-09 NOTE — Progress Notes (Signed)
PROGRESS NOTE    [name].  SG:[id number] DOB: Jun 27, 1934 DOA: 07/03/2016  PCP: [name], MD   Brief Narrative:  Seth Hauptmann. is a 82 y.o. male with medical history significant of dementia, poorly controlled DM, severe AS status post redo aVR (bioprosthetic valve), chronic diastolic heart failure, CAD status post CABG, chronic valvular atrial fibrillation on Coumadin, history of left atrial thrombus noted on TEE in 7/12, history of peripheral vascular disease s/p PTCA of R tibial artery and Left SFA stent placement,  amputation of L great toe and subsequent left foot ulcer with osteomyelitis on Keflex was referred from the infectious disease MDs office for evaluation of hypoxia.  Subjective: Confused, no complaints.   Assessment & Plan:   Principal Problem:   Acute diastolic CHF (congestive heart failure), acute hypoxic resp failure - started IV lasix- has diuresed well- pulse ox 97% on room air -switched Lasix to oral home dose of 80 in AM and 40 in PM but but exam consistent with recurrence of effusion- CXR confirms this although is weight has only gone up by 1 kg -- will need to start IV Lasix again - follow for improvement - cont aldactone, Lisinopril -  Following  Cr, I and O, daily wieghts  Active Problems: Right Pleural effusion - as above - thoracentesis 1/12 with removal of 1.8 L yellow fluid - per Light's criteria, fluid is transudative - no further work up needed    Diabetic osteomyelitis with PVD -  Has been on Keflex but wound continues to be infected which is why I have re-consulted ID and Vascular sugery -  will have Angiogram on Thursday once right groin candida infection clears - ID has discontinued antibiotics for now - wound care consulted  Candida dermatitis of groin - Fluconazole started by ID- add Nystatin powder- cannot have angio until this clears    Chronic atrial fibrillation - CHA2DS2-VASc Score at least 6 - not on rate controlling  agent- holding Coumain for angiogram - Heparin started  H/o atrial thrombus - Coumadin- switched to Heparin for bridging    S/P AVR (aortic valve replacement) - bioprosthetic    PACEMAKER, PERMANENT/   Complete heart block   CAD s/p CABG - not on ASA- cont statin    Malnutrition of moderate degree - supplements ordered  Dementia - following for behavioral disturbances   DVT prophylaxis: Coumadin Code Status: Full code Family Communication: wife Disposition Plan: home when stable Consultants:   IR Procedures:   Thoracentesis  Antimicrobials:  Anti-infectives    Start     Dose/Rate Route Frequency Ordered Stop   07/05/16 1330  fluconazole (DIFLUCAN) tablet 100 mg     100 mg Oral Daily 07/05/16 1231     07/04/16 2200  vancomycin (VANCOCIN) 1,750 mg in sodium chloride 0.9 % 500 mL IVPB  Status:  Discontinued     1,750 mg 250 mL/hr over 120 Minutes Intravenous Every 24 hours 07/04/16 2132 07/05/16 1231   07/03/16 1930  cephALEXin (KEFLEX) capsule 500 mg  Status:  Discontinued     500 mg Oral Every 8 hours 07/03/16 1835 07/05/16 1458       Objective: Vitals:   07/08/16 0832 07/08/16 1153 07/08/16 2033 07/09/16 0636  BP:  (!) 106/45 (!) 105/41 (!) 92/55  Pulse:  (!) 58 65 61  Resp:  18 18 18   Temp:  97.8 F (36.6 C) 97.5 F (36.4 C) 98.5 F (36.9 C)  TempSrc:  Oral Oral Oral  SpO2:  94% 92% 92%  Weight: 89.2 kg (196 lb 11.2 oz)   88.7 kg (195 lb 9.6 oz)  Height:        Intake/Output Summary (Last 24 hours) at 07/09/16 1153 Last data filed at 07/09/16 0944  Gross per 24 hour  Intake           767.86 ml  Output              650 ml  Net           117.86 ml   Filed Weights   07/08/16 0653 07/08/16 0832 07/09/16 0636  Weight: 88.7 kg (195 lb 9.6 oz) 89.2 kg (196 lb 11.2 oz) 88.7 kg (195 lb 9.6 oz)    Examination: General exam: Appears comfortable  HEENT: PERRLA, oral mucosa moist, no sclera icterus or thrush Respiratory system: CTA b/l  Absent breath  sound in R lower lung fied - Respiratory effort normal. Cardiovascular system: S1 & S2 heard, RRR.  No murmurs  Gastrointestinal system: Abdomen soft, non-tender, nondistended. Normal bowel sound. No organomegaly Central nervous system: Alert and oriented. No focal neurological deficits. Extremities: No cyanosis, clubbing or edema Skin: wound on left foot at head of L metatarsal with exposed bone Psychiatry:  Mood & affect appropriate.     Data Reviewed: I have personally reviewed following labs and imaging studies  CBC:  Recent Labs Lab 07/03/16 1540 07/05/16 0613 07/06/16 0433 07/07/16 0545 07/08/16 0304 07/09/16 0411  WBC 8.3 11.4* 12.9* 11.2* 10.5 10.7*  NEUTROABS 5.9  --  9.6*  --   --   --   HGB 12.0* 12.5* 12.3* 12.6* 11.8* 11.7*  HCT 37.3* 38.9* 37.6* 38.6* 36.7* 36.3*  MCV 87.1 86.8 87.9 88.3 88.0 87.7  PLT 203 227 228 223 216 [id number]   Basic Metabolic Panel:  Recent Labs Lab 07/05/16 1534 07/06/16 0433 07/07/16 0545 07/08/16 0304 07/09/16 0411  NA 137 134* 136 135 135  K 3.6 3.9 4.0 4.9 4.2  CL 95* 92* 93* 93* 95*  CO2 32 33* 35* 32 29  GLUCOSE 81 114* 93 121* 136*  BUN 36* 44* 44* 50* 41*  CREATININE 1.25* 1.31* 1.29* 1.28* 1.21  CALCIUM 9.9 10.0 10.1 9.7 9.9   GFR: Estimated Creatinine Clearance: 55.5 mL/min (by C-G formula based on SCr of 1.21 mg/dL). Liver Function Tests:  Recent Labs Lab 07/04/16 0502  AST 29  ALT 25  ALKPHOS 129*  BILITOT 0.8  PROT 6.8  ALBUMIN 2.5*   No results for input(s): LIPASE, AMYLASE in the last 168 hours. No results for input(s): AMMONIA in the last 168 hours. Coagulation Profile:  Recent Labs Lab 07/05/16 0613 07/06/16 0433 07/07/16 0545 07/08/16 0304 07/09/16 0411  INR 2.68 2.26 1.78 1.65 1.49   Cardiac Enzymes: No results for input(s): CKTOTAL, CKMB, CKMBINDEX, TROPONINI in the last 168 hours. BNP (last 3 results) No results for input(s): PROBNP in the last 8760 hours. HbA1C: No results for  input(s): HGBA1C in the last 72 hours. CBG:  Recent Labs Lab 07/08/16 1709 07/08/16 1744 07/08/16 2030 07/09/16 0547 07/09/16 1115  GLUCAP 38* 134* 306* 105* 98   Lipid Profile: No results for input(s): CHOL, HDL, LDLCALC, TRIG, CHOLHDL, LDLDIRECT in the last 72 hours. Thyroid Function Tests: No results for input(s): TSH, T4TOTAL, FREET4, T3FREE, THYROIDAB in the last 72 hours. Anemia Panel: No results for input(s): VITAMINB12, FOLATE, FERRITIN, TIBC, IRON, RETICCTPCT in the last 72 hours. Urine analysis:    Component Value Date/Time  COLORURINE YELLOW 01/14/2011 [place] 01/14/2011 1149   LABSPEC 1.011 01/14/2011 1149   PHURINE 7.0 01/14/2011 1149   GLUCOSEU NEGATIVE 01/14/2011 1149   HGBUR NEGATIVE 01/14/2011 1149   BILIRUBINUR NEGATIVE 01/14/2011 1149   KETONESUR NEGATIVE 01/14/2011 1149   PROTEINUR NEGATIVE 01/14/2011 1149   UROBILINOGEN 1.0 01/14/2011 1149   NITRITE NEGATIVE 01/14/2011 1149   LEUKOCYTESUR TRACE (A) 01/14/2011 1149   Sepsis Labs: @LABRCNTIP (procalcitonin:4,lacticidven:4) ) Recent Results (from the past 240 hour(s))  Culture, blood (routine x 2)     Status: Abnormal   Collection Time: 07/03/16  9:40 PM  Result Value Ref Range Status   Specimen Description BLOOD RIGHT ARM  Final   Special Requests BOTTLES DRAWN AEROBIC AND ANAEROBIC 5ML  Final   Culture  Setup Time   Final    GRAM POSITIVE COCCI IN CLUSTERS IN BOTH AEROBIC AND ANAEROBIC BOTTLES CRITICAL RESULT CALLED TO, READ BACK BY AND VERIFIED WITH: T. DANG, PHARMD, 07/04/16 2117 BY J. FUDESCO    Culture (A)  Final    STAPHYLOCOCCUS SPECIES (COAGULASE NEGATIVE) THE SIGNIFICANCE OF ISOLATING THIS ORGANISM FROM A SINGLE SET OF BLOOD CULTURES WHEN MULTIPLE SETS ARE DRAWN IS UNCERTAIN. PLEASE NOTIFY THE MICROBIOLOGY DEPARTMENT WITHIN ONE WEEK IF SPECIATION AND SENSITIVITIES ARE REQUIRED.    Report Status 07/06/2016 FINAL  Final  Blood Culture ID Panel (Reflexed)     Status:  Abnormal   Collection Time: 07/03/16  9:40 PM  Result Value Ref Range Status   Enterococcus species NOT DETECTED NOT DETECTED Final   Listeria monocytogenes NOT DETECTED NOT DETECTED Final   Staphylococcus species DETECTED (A) NOT DETECTED Final    Comment: CRITICAL RESULT CALLED TO, READ BACK BY AND VERIFIED WITH: T. DANG PHARMD 07/04/16 2117 BY J FUDESCO    Staphylococcus aureus NOT DETECTED NOT DETECTED Final   Methicillin resistance DETECTED (A) NOT DETECTED Final    Comment: CRITICAL RESULT CALLED TO, READ BACK BY AND VERIFIED WITH: T. DANG PHARMD 07/04/16 2117 BY J. FUDESCO    Streptococcus species NOT DETECTED NOT DETECTED Final   Streptococcus agalactiae NOT DETECTED NOT DETECTED Final   Streptococcus pneumoniae NOT DETECTED NOT DETECTED Final   Streptococcus pyogenes NOT DETECTED NOT DETECTED Final   Acinetobacter baumannii NOT DETECTED NOT DETECTED Final   Enterobacteriaceae species NOT DETECTED NOT DETECTED Final   Enterobacter cloacae complex NOT DETECTED NOT DETECTED Final   Escherichia coli NOT DETECTED NOT DETECTED Final   Klebsiella oxytoca NOT DETECTED NOT DETECTED Final   Klebsiella pneumoniae NOT DETECTED NOT DETECTED Final   Proteus species NOT DETECTED NOT DETECTED Final   Serratia marcescens NOT DETECTED NOT DETECTED Final   Haemophilus influenzae NOT DETECTED NOT DETECTED Final   Neisseria meningitidis NOT DETECTED NOT DETECTED Final   Pseudomonas aeruginosa NOT DETECTED NOT DETECTED Final   Candida albicans NOT DETECTED NOT DETECTED Final   Candida glabrata NOT DETECTED NOT DETECTED Final   Candida krusei NOT DETECTED NOT DETECTED Final   Candida parapsilosis NOT DETECTED NOT DETECTED Final   Candida tropicalis NOT DETECTED NOT DETECTED Final  Culture, blood (routine x 2)     Status: None (Preliminary result)   Collection Time: 07/03/16  9:46 PM  Result Value Ref Range Status   Specimen Description BLOOD LEFT ARM  Final   Special Requests BOTTLES DRAWN  AEROBIC AND ANAEROBIC 5ML  Final   Culture NO GROWTH 4 DAYS  Final   Report Status PENDING  Incomplete  Culture, body fluid-bottle  Status: None (Preliminary result)   Collection Time: 07/04/16 10:34 AM  Result Value Ref Range Status   Specimen Description FLUID PLEURAL RIGHT  Final   Special Requests NONE  Final   Culture NO GROWTH 4 DAYS  Final   Report Status PENDING  Incomplete  Gram stain     Status: None   Collection Time: 07/04/16 10:34 AM  Result Value Ref Range Status   Specimen Description FLUID PLEURAL RIGHT  Final   Special Requests NONE  Final   Gram Stain   Final    RARE WBC PRESENT, PREDOMINANTLY MONONUCLEAR NO ORGANISMS SEEN    Report Status 07/04/2016 FINAL  Final  Blood Cultures x 2 sites     Status: None (Preliminary result)   Collection Time: 07/05/16  3:34 PM  Result Value Ref Range Status   Specimen Description BLOOD RIGHT ARM  Final   Special Requests BOTTLES DRAWN AEROBIC AND ANAEROBIC 4CC  Final   Culture NO GROWTH 3 DAYS  Final   Report Status PENDING  Incomplete  Blood Cultures x 2 sites     Status: None (Preliminary result)   Collection Time: 07/05/16  3:44 PM  Result Value Ref Range Status   Specimen Description BLOOD LEFT ARM  Final   Special Requests BOTTLES DRAWN AEROBIC AND ANAEROBIC 5CC  Final   Culture NO GROWTH 3 DAYS  Final   Report Status PENDING  Incomplete         Radiology Studies: Dg Chest Port 1 View  Result Date: 07/09/2016 CLINICAL DATA:  Hypoxia EXAM: PORTABLE CHEST 1 VIEW COMPARISON:  07/04/2016 FINDINGS: Increased hazy density of the chest with interstitial coarsening. Moderate right pleural effusion, increased. Chronic cardiopericardial enlargement. Single chamber pacer from the left. Previous median sternotomy with history of aortic valve replacement. IMPRESSION: CHF pattern including moderate right pleural effusion, progressed from 5 days ago. Electronically Signed   By: Monte Fantasia M.D.   On: 07/09/2016 10:34       Scheduled Meds: . collagenase   Topical Daily  . donepezil  10 mg Oral QHS  . ezetimibe-simvastatin  1 tablet Oral QHS  . feeding supplement (ENSURE ENLIVE)  237 mL Oral Q24H  . feeding supplement (PRO-STAT SUGAR FREE 64)  30 mL Oral BID  . fluconazole  100 mg Oral Daily  . furosemide  40 mg Intravenous BID  . insulin aspart  12 Units Subcutaneous TID WC  . insulin glargine  13 Units Subcutaneous QHS  . lisinopril  10 mg Oral Daily  . multivitamin with minerals  1 tablet Oral Daily  . nystatin   Topical TID  . sodium chloride flush  3 mL Intravenous Q12H  . spironolactone  25 mg Oral q1800   Continuous Infusions: . heparin 1,400 Units/hr (07/09/16 0834)     LOS: 6 days    Time spent in minutes: 103    [place], MD [facility] Pager: [email or web address] Password [facility] 07/09/2016, 11:53 AM

## 2016-07-09 NOTE — Progress Notes (Signed)
ANTICOAGULATION CONSULT NOTE - FOLLOW UP  Pharmacy Consult:  Heparin Indication:  History of Afib  Allergies  Allergen Reactions  . Potassium-Containing Compounds Anaphylaxis    IV--loss of conciousness  . Metoprolol Succinate Other (See Comments)    Extremely tired    Patient Measurements: Height: 5\' 10"  (177.8 cm) Weight: 195 lb 9.6 oz (88.7 kg) (scale b) IBW/kg (Calculated) : 73 Heparin Dosing Weight: 88 kg  Vital Signs: Temp: 98.5 F (36.9 C) (01/17 0636) Temp Source: Oral (01/17 0636) BP: 92/55 (01/17 0636) Pulse Rate: 61 (01/17 0636)  Labs:  Recent Labs  07/07/16 0545  07/08/16 0304 07/08/16 1125 07/09/16 0411  HGB 12.6*  --  11.8*  --  11.7*  HCT 38.6*  --  36.7*  --  36.3*  PLT 223  --  216  --  211  LABPROT 20.9*  --  19.7*  --  18.1*  INR 1.78  --  1.65  --  1.49  HEPARINUNFRC  --   < > 0.38 0.35 0.64  CREATININE 1.29*  --  1.28*  --  1.21  < > = values in this interval not displayed.  Estimated Creatinine Clearance: 55.5 mL/min (by C-G formula based on SCr of 1.21 mg/dL).    Assessment: 39 YOM on Coumadin PTA for history of Afib.  He also has a history of bioprosthetic AVR and LAA thrombus.  Coumadin has been on hold for angiogram currently scheduled for Thursday.  INR decreased to sub-therapeutic level and IV heparin bridge added on 07/07/16.  Heparin level is therapeutic and trended up; no bleeding reported.   Goal of Therapy:  Heparin level 0.3-0.7 units/ml Monitor platelets by anticoagulation protocol: Yes    Plan:  - Decrease heparin gtt to 1400 units/hr - Daily heparin level and CBC   Eudell Julian D. Mina Marble, PharmD, BCPS Pager:  [phone] 07/09/2016, 8:20 AM

## 2016-07-10 ENCOUNTER — Encounter (HOSPITAL_COMMUNITY)
Admission: EM | Disposition: A | Discharge status: Skilled Nursing Facility | Payer: Self-pay | Source: Home / Self Care | Attending: Internal Medicine

## 2016-07-10 ENCOUNTER — Encounter (HOSPITAL_COMMUNITY): Payer: Self-pay | Admitting: Vascular Surgery

## 2016-07-10 HISTORY — PX: PERIPHERAL VASCULAR CATHETERIZATION: SHX172C

## 2016-07-10 LAB — CBC
HCT: 37.5 % — ABNORMAL LOW (ref 39.0–52.0)
HEMOGLOBIN: 11.8 g/dL — AB (ref 13.0–17.0)
MCH: 28 pg (ref 26.0–34.0)
MCHC: 31.5 g/dL (ref 30.0–36.0)
MCV: 88.9 fL (ref 78.0–100.0)
Platelets: 227 10*3/uL (ref 150–400)
RBC: 4.22 MIL/uL (ref 4.22–5.81)
RDW: 15 % (ref 11.5–15.5)
WBC: 10.9 10*3/uL — AB (ref 4.0–10.5)

## 2016-07-10 LAB — BASIC METABOLIC PANEL
ANION GAP: 9 (ref 5–15)
BUN: 37 mg/dL — ABNORMAL HIGH (ref 6–20)
CALCIUM: 10 mg/dL (ref 8.9–10.3)
CO2: 32 mmol/L (ref 22–32)
Chloride: 94 mmol/L — ABNORMAL LOW (ref 101–111)
Creatinine, Ser: 1.09 mg/dL (ref 0.61–1.24)
GFR calc non Af Amer: >60 mL/min (ref >60)
Glucose, Bld: 114 mg/dL — ABNORMAL HIGH (ref 65–99)
Potassium: 4.5 mmol/L (ref 3.5–5.1)
Sodium: 135 mmol/L (ref 135–145)

## 2016-07-10 LAB — CULTURE, BLOOD (ROUTINE X 2)
Culture: NO GROWTH
Culture: NO GROWTH

## 2016-07-10 LAB — PROTIME-INR
INR: 1.28
PROTHROMBIN TIME: 16 s — AB (ref 11.4–15.2)

## 2016-07-10 LAB — POCT ACTIVATED CLOTTING TIME: Activated Clotting Time: 158 seconds

## 2016-07-10 LAB — GLUCOSE, CAPILLARY
GLUCOSE-CAPILLARY: 116 mg/dL — AB (ref 65–99)
GLUCOSE-CAPILLARY: 101 mg/dL — AB (ref 65–99)
GLUCOSE-CAPILLARY: 189 mg/dL — AB (ref 65–99)
GLUCOSE-CAPILLARY: 178 mg/dL — AB (ref 65–99)

## 2016-07-10 LAB — HEPARIN LEVEL (UNFRACTIONATED): HEPARIN UNFRACTIONATED: 0.37 [IU]/mL (ref 0.30–0.70)

## 2016-07-10 SURGERY — ABDOMINAL AORTOGRAM

## 2016-07-10 MED ORDER — HEPARIN (PORCINE) IN NACL 2-0.9 UNIT/ML-% IJ SOLN
INTRAMUSCULAR | Status: DC | PRN
  Administered 2016-07-10: 1000 mL

## 2016-07-10 MED ORDER — ACETAMINOPHEN 325 MG PO TABS: 650.0000 mg | ORAL_TABLET | ORAL | Status: DC | PRN

## 2016-07-10 MED ORDER — HEPARIN (PORCINE) IN NACL 100-0.45 UNIT/ML-% IJ SOLN
1400.0000 [IU]/h | INTRAMUSCULAR | Status: DC
  Administered 2016-07-10 – 2016-07-11 (×2): 1400 [IU]/h via INTRAVENOUS
  Filled 2016-07-10: qty 250

## 2016-07-10 MED ORDER — HEPARIN (PORCINE) IN NACL 2-0.9 UNIT/ML-% IJ SOLN
INTRAMUSCULAR | Status: AC
  Filled 2016-07-10: qty 1000

## 2016-07-10 MED ORDER — SPIRONOLACTONE 25 MG PO TABS
25.0000 mg | ORAL_TABLET | Freq: Two times a day (BID) | ORAL | Status: DC
  Administered 2016-07-10 – 2016-07-12 (×5): 25 mg via ORAL
  Filled 2016-07-10 (×6): qty 1

## 2016-07-10 MED ORDER — SODIUM CHLORIDE 0.9 % IV SOLN
1.0000 mL/kg/h | INTRAVENOUS | Status: AC
  Administered 2016-07-10: 1 mL/kg/h via INTRAVENOUS

## 2016-07-10 MED ORDER — LIDOCAINE HCL (PF) 1 % IJ SOLN
INTRAMUSCULAR | Status: AC
  Filled 2016-07-10: qty 30

## 2016-07-10 MED ORDER — SODIUM CHLORIDE 0.9 % IV SOLN
INTRAVENOUS | Status: DC
  Administered 2016-07-10: 06:00:00 via INTRAVENOUS

## 2016-07-10 MED ORDER — LIDOCAINE HCL (PF) 1 % IJ SOLN
INTRAMUSCULAR | Status: DC | PRN
  Administered 2016-07-10: 8 mL
  Administered 2016-07-10: 10 mL

## 2016-07-10 MED ORDER — IODIXANOL 320 MG/ML IV SOLN
INTRAVENOUS | Status: DC | PRN
  Administered 2016-07-10: 100 mL via INTRAVENOUS

## 2016-07-10 MED ORDER — ONDANSETRON HCL 4 MG/2ML IJ SOLN: 4.0000 mg | Freq: Four times a day (QID) | INTRAMUSCULAR | Status: DC | PRN

## 2016-07-10 MED ORDER — ENOXAPARIN SODIUM 40 MG/0.4ML ~~LOC~~ SOLN: 40.0000 mg | SUBCUTANEOUS | Status: DC

## 2016-07-10 SURGICAL SUPPLY — 11 items
CATH OMNI FLUSH 5F 65CM (CATHETERS) ×2 IMPLANT
CATH STRAIGHT 5FR 65CM (CATHETERS) ×2 IMPLANT
COVER PRB 48X5XTLSCP FOLD TPE (BAG) ×1 IMPLANT
COVER PROBE 5X48 (BAG) ×4
KIT MICROINTRODUCER STIFF 5F (SHEATH) ×6 IMPLANT
KIT PV (KITS) ×4 IMPLANT
SHEATH PINNACLE 5F 10CM (SHEATH) ×2 IMPLANT
SYR MEDRAD MARK V 150ML (SYRINGE) ×4 IMPLANT
TRANSDUCER W/STOPCOCK (MISCELLANEOUS) ×4 IMPLANT
TRAY PV CATH (CUSTOM PROCEDURE TRAY) ×4 IMPLANT
WIRE HITORQ VERSACORE ST 145CM (WIRE) ×2 IMPLANT

## 2016-07-10 NOTE — Progress Notes (Addendum)
ANTICOAGULATION CONSULT NOTE - FOLLOW UP  Pharmacy Consult:  Heparin Indication:  History of Afib  Allergies  Allergen Reactions  . Potassium-Containing Compounds Anaphylaxis    IV--loss of conciousness  . Metoprolol Succinate Other (See Comments)    Extremely tired    Patient Measurements: Height: 5\' 10"  (177.8 cm) Weight: 192 lb 9.6 oz (87.4 kg) (Scale B) IBW/kg (Calculated) : 73 Heparin Dosing Weight: 88 kg  Vital Signs: Temp: 98 F (36.7 C) (01/18 0630) Temp Source: Oral (01/18 0630) BP: 99/49 (01/18 0630) Pulse Rate: 62 (01/18 0630)  Labs:  Recent Labs  07/08/16 0304 07/08/16 1125 07/09/16 0411 07/10/16 0420  HGB 11.8*  --  11.7* 11.8*  HCT 36.7*  --  36.3* 37.5*  PLT 216  --  211 227  LABPROT 19.7*  --  18.1* 16.0*  INR 1.65  --  1.49 1.28  HEPARINUNFRC 0.38 0.35 0.64 0.37  CREATININE 1.28*  --  1.21 1.09    Estimated Creatinine Clearance: 56.7 mL/min (by C-G formula based on SCr of 1.09 mg/dL).    Assessment: 35 YOM on Coumadin PTA for history of Afib.  He also has a history of bioprosthetic AVR and LAA thrombus.  Coumadin has been on hold for angiogram currently scheduled for Thursday.  INR decreased to sub-therapeutic level and IV heparin bridge added on 07/07/16.  Heparin level is therapeutic; no bleeding reported.   Goal of Therapy:  Heparin level 0.3-0.7 units/ml Monitor platelets by anticoagulation protocol: Yes    Plan:  - Continue heparin gtt at 1400 units/hr - Daily heparin level and CBC   Shanan Mcmiller D. Mina Marble, PharmD, BCPS Pager:  [phone] 07/10/2016, 9:26 AM    ========================   Addendum: - heparin was turned off this AM for angiogram - spoke to Dr. Bridgett Larsson, may resume at 1800   Plan: - At 1800, resume heparin gtt at 1400 units/hr - Check heparin level with AM lab - Daily heparin level and CBC   Humna Moorehouse D. Mina Marble, PharmD, BCPS Pager:  [phone] 07/10/2016, 12:40 PM

## 2016-07-10 NOTE — Progress Notes (Signed)
   Daily Progress Note   Pt likely has single vessel runoff to left foot.  Could attempt 1st metatarsal amputation and see if he heals.  At the earliest, might schedule for Monday with Dr. Donzetta Matters.  - continue heparin drip for now - will preop over the weekend  Adele Barthel, MD, [facility] [place] Office: [phone] Pager: [phone]  07/10/2016, 4:00 PM

## 2016-07-10 NOTE — Progress Notes (Signed)
RN called into room for patient trying to get out of bed post arotogram. Called cath lab to have someone check patient. Will monitor.

## 2016-07-10 NOTE — Interval H&P Note (Signed)
[facility] [place]  History and Physical Update  The patient was interviewed and re-examined.  The patient's previous History and Physical has been reviewed and is unchanged from our NP's consult.  There is no change in the plan of care: Aortogram, left leg runoff, possible intervention.   I discussed with the patient the nature of angiographic procedures, especially the limited patencies of any endovascular intervention.    The patient is aware of that the risks of an angiographic procedure include but are not limited to: bleeding, infection, access site complications, renal failure, embolization, rupture of vessel, dissection, arteriovenous fistula, possible need for emergent surgical intervention, possible need for surgical procedures to treat the patient's pathology, anaphylactic reaction to contrast, and stroke and death.    The patient is aware of the risks and agrees to proceed.   Adele Barthel, MD, FACS [facility] [place] Office: [phone] Pager: [phone]  07/10/2016, 10:53 AM

## 2016-07-10 NOTE — Progress Notes (Signed)
Paged MD regarding NS orders. Will monitor.

## 2016-07-10 NOTE — H&P (View-Only) (Signed)
[facility] [place]   CC: Follow up peripheral artery occlusive disease  History of Present Illness [name]. is a 82 y.o. male patient of Dr. Trula Slade. The patient was seen early in 2015 with a right heel ulcer x2 as well as a breakdown of the toe amputation site at the left great toe.  He has previously undergone left superficial femoral artery stenting and subintimal recanalization of an occluded left anterior tibial artery with balloon angioplasty in 2011. This was done in the setting of an ischemic left toe ulcer which ultimately required left great toe amputation. In May of 2014, he underwent successful recanalization of an occluded right anterior tibial artery with subsequent balloon angioplasty using a 3 mm balloon. He was brought back several weeks later to address a high-grade stenosis vs. occlusion of his left superficial femoral artery stent. He underwent successful stenting of a nearly occluded left superficial femoral artery stent using a 7 x 1 20 self-expanding stent. This was done in June of 2014. The patient has gone on to receive wound care at the wound center and had completely healed all of his ulcers.   Pt was last evaluated in our office by me on 06-13-15. At that time wife stated pt has dementia, eats anything he wants including during the night, was unsteady on his feet. Thin black ring surrounding left second toenail, dry callus at right heel; both not present at previous visit. 05/21/15 left LE arterial duplex suggested a patent left superficial femoral artery stent with mildly elevated velocities in the proximal to mid segment with hyperplasia noted. Velocities suggested a <50% stenosis. Unable to determine ankle brachial indices due to non-compressible vessels. Right LE with monophasic waveforms, left LE with biphasic waveforms.  Multiple medical problems: uncontrolled DM, CHF, dementia, wife not able to cope with his issues, economic issues,  a young relative lives with them who has significant mental health issues, wife has not been able to take pt to pt's PCP in about 2 years. I referred pt to [facility] but [facility] declined pt.  I advised wife to have pt see his PCP ASAP re his right side lung rales at that time. I advised that pt return in 3 months with ABI's.   He was lost to follow up until today, returning with wife report of staph infection at the open wound on his right great toe amputation site and osteomyelitis found on recent MRI of left forefoot. He continues weekly visit to wound care center at [facility]. Wife states pt has been taking Keflex for a few weeks.   He denies history of stroke or TIA.  Pt Diabetic: Yes, wife states is not in control and states he gets up at night to eat, states his A1C in 2014 was 12.?. Last A1C result on file was 10.7 on 06-02-14 (review of records). Wife stated that he has not had his DM checked in a long time due to many other medical visits.  Pt smoker: former smoker, quit in 1990  Pt meds include: Statin :Yes ASA: no Other anticoagulants/antiplatelets: takes coumadin for history of 2 heart valve replacements    Past Medical History:  Diagnosis Date  . Aortal stenosis 2006   biophrostetic  . Arthritis   . Atrial fibrillation ([place])    onset after CABG--- coumadin management at Coumadin clinic  . Carotid artery disease ([place]) 09/27/2010   Carotid US 12/17: bilat ICA 40-59 >> 1 year FU  . CHF (congestive heart  failure) ([place])    abter CABG  . Complete heart block ([place])   . Coronary artery disease   . DM type 2 (diabetes mellitus, type 2) ([place])    used to see Dr Meredith Pel, now f/u by Dr Larose Kells  . Erectile dysfunction   . Gangrene ([place])   . History of nephrolithiasis   . HTN (hypertension)   . Hyperlipidemia   . Memory loss   . Obesity   . OSA (obstructive sleep apnea)    not using CPAP  . Pacemaker 2/07   VVI  . PVD (peripheral vascular disease) ([name])    CT angio showed- vascular insuff,  intestine and RAS bilaterally, andgiogram 02/2009 severe PVD medical management  . S/P aortic valve replacement   . Sick sinus syndrome ([name])    [name]  . Skin cancer    surgery (nose) 09-2010    Social History Social History  Substance Use Topics  . Smoking status: Former Smoker    Types: Cigarettes    Quit date: 06/23/1988  . Smokeless tobacco: Former Systems developer  . Alcohol use No    Family History Family History  Problem Relation Age of Onset  . Heart attack Father 17  . Heart disease Father     Before age 80  . Hypertension Father   . Stroke Mother 79  . Cancer Sister     Cervical  . Heart attack Sister   . Heart disease Sister   . Diabetes Sister     2 sister w/ DM  . Heart attack Sister   . Heart attack Sister   . Heart disease Sister 38    AAA  and  Stomach Aneurysm  . AAA (abdominal aortic aneurysm) Sister   . Heart disease Sister     Before age 62  . Heart attack Sister 69  . Colon cancer Neg Hx   . Prostate cancer Neg Hx     Past Surgical History:  Procedure Laterality Date  . ABDOMINAL AORTAGRAM N/A 11/10/2012   Procedure: ABDOMINAL Maxcine Ham;  Surgeon: Serafina Mitchell, MD;  Location: [facility] CATH LAB;  Service: Cardiovascular;  Laterality: N/A;  . AMPUTATION  01/2010   great toe  . ANGIOPLASTY / STENTING FEMORAL  02/10/10   Left femoral  . AORTIC VALVE REPLACEMENT     biophrostetic for Ao Stenosis 2006  . AORTIC VALVE REPLACEMENT  01/17/2011   S/P redo median sternotomy, extracorporeal circulation, redo Aortic Valve Replacement using a 23-mm Edwards pericardial Magna-Ease valve, Dr Ulla Gallo  . CATARACT EXTRACTION, BILATERAL    . CORONARY ARTERY BYPASS GRAFT  2006  . EP IMPLANTABLE DEVICE N/A 06/06/2016   Procedure: PPM Generator Changeout;  Surgeon: Thompson Grayer, MD;  Location: [facility] CV LAB;  Service: Cardiovascular;  Laterality: N/A;  . EYE SURGERY    . INCISE AND DRAIN ABCESS Right May 2014   right heel   . LOWER EXTREMITY  ANGIOGRAM Left 11/23/2012   Procedure: LOWER EXTREMITY ANGIOGRAM;  Surgeon: Serafina Mitchell, MD;  Location[facility] CATH LAB;  Service: Cardiovascular;  Laterality: Left;  . PACEMAKER PLACEMENT  07/2005   Medtronic Sigma SR implanted by Dr Olevia Perches  . TONSILLECTOMY      Allergies  Allergen Reactions  . Potassium-Containing Compounds Anaphylaxis    IV--loss of conciousness  . Metoprolol Succinate Other (See Comments)    Extremely tired    Current Outpatient Prescriptions  Medication Sig Dispense Refill  . cephALEXin (KEFLEX) 500 MG capsule take 1 capsule  by mouth every 8 hours  0  . donepezil (ARICEPT) 10 MG tablet Take 1 tablet (10 mg total) by mouth at bedtime. 30 tablet 11  . ezetimibe-simvastatin (VYTORIN) 10-40 MG per tablet Take 1 tablet by mouth at bedtime. 30 tablet 3  . furosemide (LASIX) 80 MG tablet Take one tablet by mouth every morning and then one-half tablet by mouth every other day in the afternoon 45 tablet 11  . insulin glargine (LANTUS) 100 UNIT/ML injection Inject 0.15 mLs (15 Units total) into the skin at bedtime. 10 mL 11  . Insulin Glulisine (APIDRA [place]) Inject 35 Units into the skin 3 (three) times daily with meals.     Marland Kitchen lisinopril (PRINIVIL,ZESTRIL) 10 MG tablet Take 1 tablet (10 mg total) by mouth daily. 90 tablet 3  . spironolactone (ALDACTONE) 25 MG tablet Take 1 tablet (25 mg total) by mouth daily. 30 tablet 11  . warfarin (COUMADIN) 5 MG tablet Take as directed by coumadin clinic 45 tablet 2   No current facility-administered medications for this visit.     ROS: See HPI for pertinent positives and negatives.   Physical Examination  Vitals:   06/26/16 1508 06/26/16 1511  BP: (!) 144/70 137/67  Pulse: 72 67  Resp: 18   Temp: (!) 96.9 F (36.1 C)   SpO2: 94%   Weight: 197 lb (89.4 kg)   Height: 5\' 8"  (1.727 m)    Body mass index is 29.95 kg/m.  General:A&O x 2, WDWN, elderly debilitated male. Gait: normal Eyes: PERRLA. Pulmonary: Respirations are  non labored at rest, + rales in left base, without wheezes or rhonchi. Cardiac: regular rhythm, +murmur. Subcutaneous pacemaker left upper chest.     Carotid Bruits Left Right   Transmitted cardiac murmur Transmitted cardiac murmur  Aorta is not palpable. Radial pulses: are 1+ palpable and =.   VASCULAR EXAM: Extremities with ischemic changes : mild rubor in plantar surfaces of right foot. without Gangrene; with open wounds: Left great toe is surgically absent, medial to toe amputation site is an open punctate wound that measures about 0.5 cm x 0.5 cm, about 2-3 mm deep which is surrounded by erythematous tissue, is foul smelling, no drainage at this time.      LE Pulses LEFT RIGHT   FEMORAL  palpable not palpable    POPLITEAL not palpable  not palpable   POSTERIOR TIBIAL not palpable  not palpable    DORSALIS PEDIS  ANTERIOR TIBIAL not palpable  not palpable    Abdomen: soft, NT, no palpated masses. Skin: no rashes, See Extremities. Musculoskeletal: Generalized deconditioning muscle wasting or atrophy. See Extremities. Neurologic: A&O X 2; Appropriate Affect ; SENSATION: normal; MOTOR FUNCTION: moving all extremities equally, motor strength 4/5 throughout. Speech is fluent/normal. CN 2-12 intact.    MRI OF LEFT FOREFOOT WITH AND W/O CONTRAST ON 06/20/2016: IMPRESSION: 1. Soft tissue ulceration medial to the first metatarsal head status post remote resection of the great toe. There is abnormal underlying signal and enhancement within the distal half of the first metatarsal consistent with osteomyelitis. This may involve the tibial sesamoid as well. 2. The additional metatarsals, toes and visualized tarsal bones demonstrate no  significant findings. 3. Nonspecific soft tissue findings with T2 hyperintensity and enhancement in the forefoot musculature, probably related to diabetic myopathy. No focal soft tissue abscess identified.    Non-Invasive Vascular Imaging: DATE: 06-12-16 ABI:  RIGHT: PT: 0.00, DP: [place], DP waveforms: appear to be monophasic; TBI: 0.22   LEFT:  PT: 0.35, DP: 1.35, Waveforms: appears to be monophasic  ASSESSMENT: [name]. is a 82 y.o. male who is s/p left superficial femoral artery stent on 02/10/10 & 11/23/12, and amputation left great toe in 2011. He was lost to follow up since his last visit on 06-13-15. He returns today with osteomyelitis suggested by left foot MRI on 06-20-16, foul smelling left foot medial metatarsal area open punctate wound.  Wife states pt has dementia, eats anything he wants including during the night, is unsteady on his feet.  05/21/15 left LE arterial duplex suggest a patent left superficial femoral artery stent with mildly elevated velocities in the proximal to mid segment with hyperplasia noted. Velocities suggest a <50% stenosis. Unable to determine ankle brachial indices due to non-compressible vessels. Right LE with monophasic waveforms, left LE with biphasic waveforms.   Multiple medical problems: uncontrolled DM, CHF, dementia, wife not able to cope with his issues, economic issues, a young relative lives with them who has significant mental health issues, wife has not been able to take pt to pt's PCP in about 2 years; referred to [facility] at pt's last visit on 06-13-15, but [facility] declined pt.   MRI of left foot results reviewed with Dr. Oneida Alar: distal half of the first metatarsal consistent with osteomyelitis. This may involve the tibial sesamoid as well. No focal soft tissue abscess identified.  Pt attends [facility] wound care clinic weekly according to wife, and wife changes dressing daily with silver alginate.  Wife also states that pt is scheduled soon to  see ID in consult re the staph infection in his left foot. Wife states he has been taking po Keflex for a few weeks.   PLAN:  Based on the patient's vascular studies and examination, and after discussing with Dr. Oneida Alar, pt will return to clinic for left LE arterial duplex as soon as possible and see Dr. Trula Slade afterward.   I discussed in depth with the patient the nature of atherosclerosis, and emphasized the importance of maximal medical management including strict control of blood pressure, blood glucose, and lipid levels, obtaining regular exercise, and cessation of smoking.  The patient is aware that without maximal medical management the underlying atherosclerotic disease process will progress, limiting the benefit of any interventions.  The patient was given information about PAD including signs, symptoms, treatment, what symptoms should prompt the patient to seek immediate medical care, and risk reduction measures to take.  Clemon Chambers, RN, MSN, FNP-C [facility] [employer] Phone: [phone]  Clinic MD: Oneida Alar  06/26/16 3:29 PM

## 2016-07-10 NOTE — Progress Notes (Signed)
PROGRESS NOTE    [name].  SG:[id number] DOB: [birth year] DOA: 07/03/2016  PCP: [name], MD   Brief Narrative:  Seth Grauberger. is a 82 y.o. male with medical history significant of dementia, poorly controlled DM, severe AS status post redo aVR (bioprosthetic valve), chronic diastolic heart failure, CAD status post CABG, chronic valvular atrial fibrillation on Coumadin, history of left atrial thrombus noted on TEE in 7/12, history of peripheral vascular disease s/p PTCA of R tibial artery and Left SFA stent placement,  amputation of L great toe and subsequent left foot ulcer with osteomyelitis on Keflex was referred from the infectious disease MDs office for evaluation of hypoxia.  Subjective: Confused, no complaints.   Assessment & Plan:   Principal Problem:   Acute diastolic CHF (congestive heart failure), acute hypoxic resp failure - started IV lasix- has diuresed well- pulse ox 97% on room air -switched Lasix to oral home dose of 80 in AM and 40 in PM but but exam consistent with recurrence of effusion- CXR confirms moderate sized R effusion although his weight has only gone up by 1 kg -- resumed IV Lasix yesterday- change Aldactone to BID-  follow for improvement - cont Lisinopril -  Following  Cr, I and O, daily wieghts- weight down by 1kg today  Active Problems: Right Pleural effusion - as above - thoracentesis 1/12 with removal of 1.8 L yellow fluid - per Light's criteria, fluid is transudative - no further work up needed    Diabetic osteomyelitis with PVD -  Has been on Keflex but wound continues to be infected which is why I have re-consulted ID and Vascular sugery -  will have Angiogram today - ID has discontinued antibiotics for now - wound care consulted  Candida dermatitis of groin - Fluconazole started by ID- add Nystatin powder- cannot have angio until this clears    Chronic atrial fibrillation - CHA2DS2-VASc Score at least 6 - not on rate  controlling agent- holding Coumain for angiogram - Heparin started  H/o atrial thrombus - Coumadin- switched to Heparin for bridging    S/P AVR (aortic valve replacement) - bioprosthetic    PACEMAKER, PERMANENT/   Complete heart block   CAD s/p CABG - not on ASA- cont statin    Malnutrition of moderate degree - supplements ordered  Dementia - following for behavioral disturbances   DVT prophylaxis: Coumadin Code Status: Full code Family Communication: wife Disposition Plan: home when stable Consultants:   IR Procedures:   Thoracentesis  Antimicrobials:  Anti-infectives    Start     Dose/Rate Route Frequency Ordered Stop   07/05/16 1330  fluconazole (DIFLUCAN) tablet 100 mg     100 mg Oral Daily 07/05/16 1231     07/04/16 2200  vancomycin (VANCOCIN) 1,750 mg in sodium chloride 0.9 % 500 mL IVPB  Status:  Discontinued     1,750 mg 250 mL/hr over 120 Minutes Intravenous Every 24 hours 07/04/16 2132 07/05/16 1231   07/03/16 1930  cephALEXin (KEFLEX) capsule 500 mg  Status:  Discontinued     500 mg Oral Every 8 hours 07/03/16 1835 07/05/16 1458       Objective: Vitals:   07/09/16 0636 07/09/16 1220 07/09/16 2050 07/10/16 0630  BP: (!) 92/55 (!) 109/43 (!) 136/52 (!) 99/49  Pulse: 61 63 63 62  Resp: 18 20 20 19   Temp: 98.5 F (36.9 C) 97.8 F (36.6 C) 97.1 F (36.2 C) 98 F (36.7 C)  TempSrc:  Oral Oral Oral Oral  SpO2: 92% 96% 94% 90%  Weight: 88.7 kg (195 lb 9.6 oz)   87.4 kg (192 lb 9.6 oz)  Height:        Intake/Output Summary (Last 24 hours) at 07/10/16 1012 Last data filed at 07/10/16 0911  Gross per 24 hour  Intake          1145.07 ml  Output              825 ml  Net           320.07 ml   Filed Weights   07/08/16 0832 07/09/16 0636 07/10/16 0630  Weight: 89.2 kg (196 lb 11.2 oz) 88.7 kg (195 lb 9.6 oz) 87.4 kg (192 lb 9.6 oz)    Examination: General exam: Appears comfortable  HEENT: PERRLA, oral mucosa moist, no sclera icterus or  thrush Respiratory system: CTA b/l  Absent breath sound in R lower and mid lung fied - Respiratory effort normal. Cardiovascular system: S1 & S2 heard, RRR.  No murmurs  Gastrointestinal system: Abdomen soft, non-tender, nondistended. Normal bowel sound. No organomegaly Central nervous system: Alert and oriented. No focal neurological deficits. Extremities: No cyanosis, clubbing or edema Skin: wound on left foot at head of L metatarsal with exposed bone Psychiatry:  Mood & affect appropriate.     Data Reviewed: I have personally reviewed following labs and imaging studies  CBC:  Recent Labs Lab 07/03/16 1540  07/06/16 0433 07/07/16 0545 07/08/16 0304 07/09/16 0411 07/10/16 0420  WBC 8.3  < > 12.9* 11.2* 10.5 10.7* 10.9*  NEUTROABS 5.9  --  9.6*  --   --   --   --   HGB 12.0*  < > 12.3* 12.6* 11.8* 11.7* 11.8*  HCT 37.3*  < > 37.6* 38.6* 36.7* 36.3* 37.5*  MCV 87.1  < > 87.9 88.3 88.0 87.7 88.9  PLT 203  < > 228 223 216 211 227  < > = values in this interval not displayed. Basic Metabolic Panel:  Recent Labs Lab 07/06/16 0433 07/07/16 0545 07/08/16 0304 07/09/16 0411 07/10/16 0420  NA 134* 136 135 135 135  K 3.9 4.0 4.9 4.2 4.5  CL 92* 93* 93* 95* 94*  CO2 33* 35* 32 29 32  GLUCOSE 114* 93 121* 136* 114*  BUN 44* 44* 50* 41* 37*  CREATININE 1.31* 1.29* 1.28* 1.21 1.09  CALCIUM 10.0 10.1 9.7 9.9 10.0   GFR: Estimated Creatinine Clearance: 56.7 mL/min (by C-G formula based on SCr of 1.09 mg/dL). Liver Function Tests:  Recent Labs Lab 07/04/16 0502  AST 29  ALT 25  ALKPHOS 129*  BILITOT 0.8  PROT 6.8  ALBUMIN 2.5*   No results for input(s): LIPASE, AMYLASE in the last 168 hours. No results for input(s): AMMONIA in the last 168 hours. Coagulation Profile:  Recent Labs Lab 07/06/16 0433 07/07/16 0545 07/08/16 0304 07/09/16 0411 07/10/16 0420  INR 2.26 1.78 1.65 1.49 1.28   Cardiac Enzymes: No results for input(s): CKTOTAL, CKMB, CKMBINDEX,  TROPONINI in the last 168 hours. BNP (last 3 results) No results for input(s): PROBNP in the last 8760 hours. HbA1C: No results for input(s): HGBA1C in the last 72 hours. CBG:  Recent Labs Lab 07/09/16 0547 07/09/16 1115 07/09/16 1625 07/09/16 2109 07/10/16 0629  GLUCAP 105* 98 214* 90 116*   Lipid Profile: No results for input(s): CHOL, HDL, LDLCALC, TRIG, CHOLHDL, LDLDIRECT in the last 72 hours. Thyroid Function Tests: No results for input(s): TSH, T4TOTAL,  FREET4, T3FREE, THYROIDAB in the last 72 hours. Anemia Panel: No results for input(s): VITAMINB12, FOLATE, FERRITIN, TIBC, IRON, RETICCTPCT in the last 72 hours. Urine analysis:    Component Value Date/Time   COLORURINE YELLOW 01/14/2011 1149   APPEARANCEUR CLEAR 01/14/2011 1149   LABSPEC 1.011 01/14/2011 1149   PHURINE 7.0 01/14/2011 1149   GLUCOSEU NEGATIVE 01/14/2011 1149   HGBUR NEGATIVE 01/14/2011 1149   BILIRUBINUR NEGATIVE 01/14/2011 1149   KETONESUR NEGATIVE 01/14/2011 1149   PROTEINUR NEGATIVE 01/14/2011 1149   UROBILINOGEN 1.0 01/14/2011 1149   NITRITE NEGATIVE 01/14/2011 1149   LEUKOCYTESUR TRACE (A) 01/14/2011 1149   Sepsis Labs: @LABRCNTIP (procalcitonin:4,lacticidven:4) ) Recent Results (from the past 240 hour(s))  Culture, blood (routine x 2)     Status: Abnormal   Collection Time: 07/03/16  9:40 PM  Result Value Ref Range Status   Specimen Description BLOOD RIGHT ARM  Final   Special Requests BOTTLES DRAWN AEROBIC AND ANAEROBIC 5ML  Final   Culture  Setup Time   Final    GRAM POSITIVE COCCI IN CLUSTERS IN BOTH AEROBIC AND ANAEROBIC BOTTLES CRITICAL RESULT CALLED TO, READ BACK BY AND VERIFIED WITH: T. DANG, PHARMD, 07/04/16 2117 BY J. FUDESCO    Culture (A)  Final    STAPHYLOCOCCUS SPECIES (COAGULASE NEGATIVE) THE SIGNIFICANCE OF ISOLATING THIS ORGANISM FROM A SINGLE SET OF BLOOD CULTURES WHEN MULTIPLE SETS ARE DRAWN IS UNCERTAIN. PLEASE NOTIFY THE MICROBIOLOGY DEPARTMENT WITHIN ONE WEEK IF  SPECIATION AND SENSITIVITIES ARE REQUIRED.    Report Status 07/06/2016 FINAL  Final  Blood Culture ID Panel (Reflexed)     Status: Abnormal   Collection Time: 07/03/16  9:40 PM  Result Value Ref Range Status   Enterococcus species NOT DETECTED NOT DETECTED Final   Listeria monocytogenes NOT DETECTED NOT DETECTED Final   Staphylococcus species DETECTED (A) NOT DETECTED Final    Comment: CRITICAL RESULT CALLED TO, READ BACK BY AND VERIFIED WITH: T. DANG PHARMD 07/04/16 2117 BY J FUDESCO    Staphylococcus aureus NOT DETECTED NOT DETECTED Final   Methicillin resistance DETECTED (A) NOT DETECTED Final    Comment: CRITICAL RESULT CALLED TO, READ BACK BY AND VERIFIED WITH: T. DANG PHARMD 07/04/16 2117 BY J. FUDESCO    Streptococcus species NOT DETECTED NOT DETECTED Final   Streptococcus agalactiae NOT DETECTED NOT DETECTED Final   Streptococcus pneumoniae NOT DETECTED NOT DETECTED Final   Streptococcus pyogenes NOT DETECTED NOT DETECTED Final   Acinetobacter baumannii NOT DETECTED NOT DETECTED Final   Enterobacteriaceae species NOT DETECTED NOT DETECTED Final   Enterobacter cloacae complex NOT DETECTED NOT DETECTED Final   Escherichia coli NOT DETECTED NOT DETECTED Final   Klebsiella oxytoca NOT DETECTED NOT DETECTED Final   Klebsiella pneumoniae NOT DETECTED NOT DETECTED Final   Proteus species NOT DETECTED NOT DETECTED Final   Serratia marcescens NOT DETECTED NOT DETECTED Final   Haemophilus influenzae NOT DETECTED NOT DETECTED Final   Neisseria meningitidis NOT DETECTED NOT DETECTED Final   Pseudomonas aeruginosa NOT DETECTED NOT DETECTED Final   Candida albicans NOT DETECTED NOT DETECTED Final   Candida glabrata NOT DETECTED NOT DETECTED Final   Candida krusei NOT DETECTED NOT DETECTED Final   Candida parapsilosis NOT DETECTED NOT DETECTED Final   Candida tropicalis NOT DETECTED NOT DETECTED Final  Culture, blood (routine x 2)     Status: None   Collection Time: 07/03/16  9:46 PM   Result Value Ref Range Status   Specimen Description BLOOD LEFT ARM  Final  Special Requests BOTTLES DRAWN AEROBIC AND ANAEROBIC 5ML  Final   Culture NO GROWTH 5 DAYS  Final   Report Status 07/09/2016 FINAL  Final  Culture, body fluid-bottle     Status: None   Collection Time: 07/04/16 10:34 AM  Result Value Ref Range Status   Specimen Description FLUID PLEURAL RIGHT  Final   Special Requests NONE  Final   Culture NO GROWTH 5 DAYS  Final   Report Status 07/09/2016 FINAL  Final  Gram stain     Status: None   Collection Time: 07/04/16 10:34 AM  Result Value Ref Range Status   Specimen Description FLUID PLEURAL RIGHT  Final   Special Requests NONE  Final   Gram Stain   Final    RARE WBC PRESENT, PREDOMINANTLY MONONUCLEAR NO ORGANISMS SEEN    Report Status 07/04/2016 FINAL  Final  Blood Cultures x 2 sites     Status: None (Preliminary result)   Collection Time: 07/05/16  3:34 PM  Result Value Ref Range Status   Specimen Description BLOOD RIGHT ARM  Final   Special Requests BOTTLES DRAWN AEROBIC AND ANAEROBIC 4CC  Final   Culture NO GROWTH 4 DAYS  Final   Report Status PENDING  Incomplete  Blood Cultures x 2 sites     Status: None (Preliminary result)   Collection Time: 07/05/16  3:44 PM  Result Value Ref Range Status   Specimen Description BLOOD LEFT ARM  Final   Special Requests BOTTLES DRAWN AEROBIC AND ANAEROBIC 5CC  Final   Culture NO GROWTH 4 DAYS  Final   Report Status PENDING  Incomplete         Radiology Studies: Dg Chest Port 1 View  Result Date: 07/09/2016 CLINICAL DATA:  Hypoxia EXAM: PORTABLE CHEST 1 VIEW COMPARISON:  07/04/2016 FINDINGS: Increased hazy density of the chest with interstitial coarsening. Moderate right pleural effusion, increased. Chronic cardiopericardial enlargement. Single chamber pacer from the left. Previous median sternotomy with history of aortic valve replacement. IMPRESSION: CHF pattern including moderate right pleural effusion,  progressed from 5 days ago. Electronically Signed   By: Monte Fantasia M.D.   On: 07/09/2016 10:34      Scheduled Meds: . collagenase   Topical Daily  . donepezil  10 mg Oral QHS  . ezetimibe-simvastatin  1 tablet Oral QHS  . feeding supplement (ENSURE ENLIVE)  237 mL Oral Q24H  . feeding supplement (PRO-STAT SUGAR FREE 64)  30 mL Oral BID  . fluconazole  100 mg Oral Daily  . furosemide  40 mg Intravenous Q8H  . insulin aspart  12 Units Subcutaneous TID WC  . insulin glargine  13 Units Subcutaneous QHS  . lisinopril  10 mg Oral Daily  . multivitamin with minerals  1 tablet Oral Daily  . nystatin   Topical TID  . sodium chloride flush  3 mL Intravenous Q12H  . spironolactone  25 mg Oral q1800   Continuous Infusions: . sodium chloride Stopped (07/10/16 0927)  . heparin 1,400 Units/hr (07/10/16 0403)     LOS: 7 days    Time spent in minutes: 77    McConnells, MD [facility] Pager: [email or web address] Password [email or web address] 07/10/2016, 10:12 AM

## 2016-07-10 NOTE — Op Note (Signed)
OPERATIVE NOTE   PROCEDURE: 1.  Bilateral common femoral artery cannulation under ultrasound guidance 2.  Placement of catheter in aorta 3.  Aortogram 4.  Bilateral leg runoff via catheter  PRE-OPERATIVE DIAGNOSIS: left foot osteomyelitis  POST-OPERATIVE DIAGNOSIS: same as above   SURGEON: Adele Barthel, MD  ANESTHESIA: conscious sedation  ESTIMATED BLOOD LOSS: 50 cc  CONTRAST: 100 cc  FINDING(S):  Aorta: patent, heavily calcified  Superior mesenteric artery: patent, heavily calcified Celiac artery: patent proximally, inadequately visualized   Right Left  RA Patent Patent, 30% stenosis  CIA Patent, calcified Patent, calcified  EIA Patent, calcified Patent, calcified  IIA Patent, calcified Patent, calcified, distally distally  CFA Patent, calcified Patent, calcified  SFA Patent, calcified Patent, calcified; patent SFA stents  PFA Patent, diseased distal profunda branches Patent, calcified  Pop Occluded  Patent  Trif Occluded Patent tibioperoneal trunk  AT Abberant takeoff at knee level with diseased vessel, only runoff Patent proximally, occludes shortly after takeoff  Pero Occluded Patent, some obscuring of runoff distally with collateral overlap of peroneal artery  PT Occluded Occluded   SPECIMEN(S):  none  INDICATIONS:   [name]. is a 82 y.o. male who presents with left foot osteomyelitis.  The patient presents for: aortogram and bilateral leg runoff.  I discussed with the patient the nature of angiographic procedures, especially the limited patencies of any endovascular intervention.  The patient is aware of that the risks of an angiographic procedure include but are not limited to: bleeding, infection, access site complications, renal failure, embolization, rupture of vessel, dissection, possible need for emergent surgical intervention, possible need for surgical procedures to treat the patient's pathology, and stroke and death.  The patient is aware of the  risks and agrees to proceed.  DESCRIPTION: After full informed consent was obtained from the patient, the patient was brought back to the angiography suite.  The patient was placed supine upon the angiography table and connected to cardiopulmonary monitoring equipment.  The patient was then given conscious sedation, the amounts of which are documented in the patient's chart.  A circulating radiologic technician maintained continuous monitoring of the patient's cardiopulmonary status.  Additionally, the control room radiologic technician provided backup monitoring throughout the procedure.  The patient was prepped and drape in the standard fashion for an angiographic procedure.  At this point, attention was turned to the right groin.  Under ultrasound guidance, the subcutaneous tissue surrounding the right common femoral artery was anesthesized with 1% lidocaine with epinephrine.  The artery was then cannulated with a micropuncture needle.  The microwire was advanced into the iliac arterial system.  I tried to get the microsheath to pass into the right common femoral artery but it encountered a dense obstruction.  I tried to serially dilate this arterial tract but this failed.  I pulled the needle and wire and held pressure to the cannulation site.  After 3 minutes, there was no further bleeding.    Under ultrasound guidance, the subcutaneous tissue surrounding the left common femoral artery was anesthesized with 1% lidocaine with epinephrine.  The artery was then cannulated with a micropuncture needle.  The microwire was advanced into the iliac arterial system.  The needle was exchanged for a microsheath, which was loaded into the common femoral artery over the wire.  The microwire was exchanged for a [facility] wire which was advanced into the aorta.  The microsheath was then exchanged for a 5-Fr sheath which was loaded into the common femoral artery.  The Omniflush catheter was then loaded over the wire up to  the level of L1.  The catheter was connected to the power injector circuit.  After de-airring and de-clotting the circuit, a power injector aortogram was completed.  The [facility] wire was replaced in the catheter, and using the [place] and Omniflush catheter, the right common iliac artery was selected.  The catheter and wire were advanced into the external iliac artery.  The wire advanced into the right common femoral artery but the catheter would not pass the narrow calcified aortic bifurcation.  I exchanged the [place] catheter for an end-hole catheter.  This also would not pass the aortic bifurcation, kicking the wire out of the right iliac arterial system.  I exchanged the catheter for the Omniflush catheter.  I deployed this catheter in the distal aorta.  The wire was removed and the catheter was connected to a power injector circuit.  An automated bilateral leg runoff was completed.  The findings are listed above.   At this point, I aborted the procedure as the patient had some difficulties with cooperation.  I replaced the wire into the catheter, straightening out the crook in the catheter.  Both were removed from the sheath together.  The sheath was aspirated.  No clots were present and the sheath was reloaded with heparinized saline.     COMPLICATIONS: none  CONDITION: stable   Adele Barthel, MD, [facility] [place] Office: [phone] Pager: [phone]  07/10/2016, 12:27 PM

## 2016-07-11 DIAGNOSIS — M86179 Other acute osteomyelitis, unspecified ankle and foot: Secondary | ICD-10-CM

## 2016-07-11 DIAGNOSIS — B957 Other staphylococcus as the cause of diseases classified elsewhere: Secondary | ICD-10-CM

## 2016-07-11 DIAGNOSIS — R0902 Hypoxemia: Secondary | ICD-10-CM

## 2016-07-11 LAB — CBC
HCT: 37 % — ABNORMAL LOW (ref 39.0–52.0)
Hemoglobin: 11.7 g/dL — ABNORMAL LOW (ref 13.0–17.0)
MCH: 27.8 pg (ref 26.0–34.0)
MCHC: 31.6 g/dL (ref 30.0–36.0)
MCV: 87.9 fL (ref 78.0–100.0)
Platelets: 229 10*3/uL (ref 150–400)
RBC: 4.21 MIL/uL — ABNORMAL LOW (ref 4.22–5.81)
RDW: 14.9 % (ref 11.5–15.5)
WBC: 10.8 10*3/uL — ABNORMAL HIGH (ref 4.0–10.5)

## 2016-07-11 LAB — GLUCOSE, CAPILLARY
GLUCOSE-CAPILLARY: 78 mg/dL (ref 65–99)
GLUCOSE-CAPILLARY: 105 mg/dL — AB (ref 65–99)
Glucose-Capillary: 128 mg/dL — ABNORMAL HIGH (ref 65–99)
Glucose-Capillary: 234 mg/dL — ABNORMAL HIGH (ref 65–99)
Glucose-Capillary: 55 mg/dL — ABNORMAL LOW (ref 65–99)

## 2016-07-11 LAB — PROTIME-INR
INR: 1.31
PROTHROMBIN TIME: 16.4 s — AB (ref 11.4–15.2)

## 2016-07-11 LAB — BASIC METABOLIC PANEL
Anion gap: 6 (ref 5–15)
BUN: 33 mg/dL — AB (ref 6–20)
CHLORIDE: 94 mmol/L — AB (ref 101–111)
CO2: 35 mmol/L — ABNORMAL HIGH (ref 22–32)
CREATININE: 1.13 mg/dL (ref 0.61–1.24)
Calcium: 10.2 mg/dL (ref 8.9–10.3)
GFR calc Af Amer: >60 mL/min (ref >60)
GFR calc non Af Amer: 60 mL/min — ABNORMAL LOW (ref >60)
Glucose, Bld: 79 mg/dL (ref 65–99)
Potassium: 4.1 mmol/L (ref 3.5–5.1)
SODIUM: 135 mmol/L (ref 135–145)

## 2016-07-11 LAB — HEPARIN LEVEL (UNFRACTIONATED)
HEPARIN UNFRACTIONATED: 0.3 [IU]/mL (ref 0.30–0.70)
Heparin Unfractionated: 0.25 IU/mL — ABNORMAL LOW (ref 0.30–0.70)

## 2016-07-11 MED ORDER — CEFAZOLIN IN D5W 1 GM/50ML IV SOLN: 1.0000 g | INTRAVENOUS | Status: DC

## 2016-07-11 MED ORDER — CEFAZOLIN SODIUM-DEXTROSE 2-4 GM/100ML-% IV SOLN
2.0000 g | Freq: Three times a day (TID) | INTRAVENOUS | Status: DC
  Administered 2016-07-11 – 2016-07-21 (×30): 2 g via INTRAVENOUS
  Filled 2016-07-11 (×34): qty 100

## 2016-07-11 MED ORDER — HEPARIN (PORCINE) IN NACL 100-0.45 UNIT/ML-% IJ SOLN
1500.0000 [IU]/h | INTRAMUSCULAR | Status: DC
  Administered 2016-07-11: 1600 [IU]/h via INTRAVENOUS
  Administered 2016-07-12 – 2016-07-15 (×6): 1700 [IU]/h via INTRAVENOUS
  Filled 2016-07-11 (×7): qty 250

## 2016-07-11 NOTE — Progress Notes (Signed)
Dressing changed completed to L great toe amputation per order. Patient tolerated well. Will continue to monitor.

## 2016-07-11 NOTE — Progress Notes (Signed)
ANTICOAGULATION CONSULT NOTE - FOLLOW UP  Pharmacy Consult:  Heparin Indication:  History of Afib  Allergies  Allergen Reactions  . Potassium-Containing Compounds Anaphylaxis    IV--loss of conciousness  . Metoprolol Succinate Other (See Comments)    Extremely tired    Patient Measurements: Height: 5\' 10"  (177.8 cm) Weight: 191 lb 3.2 oz (86.7 kg) (scale b) IBW/kg (Calculated) : 73 Heparin Dosing Weight: 88 kg  Vital Signs: Temp: 98.4 F (36.9 C) (01/19 0510) Temp Source: Oral (01/19 0510) BP: 123/50 (01/19 0510) Pulse Rate: 60 (01/19 0510)  Labs:  Recent Labs  07/09/16 0411 07/10/16 0420 07/11/16 0549  HGB 11.7* 11.8* 11.7*  HCT 36.3* 37.5* 37.0*  PLT 211 227 229  LABPROT 18.1* 16.0* 16.4*  INR 1.49 1.28 1.31  HEPARINUNFRC 0.64 0.37 0.25*  CREATININE 1.21 1.09 1.13    Estimated Creatinine Clearance: 54.7 mL/min (by C-G formula based on SCr of 1.13 mg/dL).    Assessment: 52 YOM on Coumadin PTA for history of Afib.  He also has a history of bioprosthetic AVR and LAA thrombus.  Coumadin has been on hold since admission and bridged with IV heparin since 07/07/16.  He is now s/p angiogram on 07/10/16 and the current plan is for amputation next week  His heparin level is sub-therapeutic post resumption.  No bleeding documented.   Goal of Therapy:  Heparin level 0.3-0.7 units/ml Monitor platelets by anticoagulation protocol: Yes    Plan:  - Increase heparin gtt to 1500 units/hr - Check 8 hr heparin level - Daily heparin level and CBC   Johannes Everage D. Mina Marble, PharmD, BCPS Pager:  [phone] 07/11/2016, 7:16 AM

## 2016-07-11 NOTE — Progress Notes (Signed)
Hypoglycemic Event  CBG: 55  Treatment: 15 GM carbohydrate snack  Symptoms: Hungry  Follow-up CBG: Time:2131 CBG Result:105  Possible Reasons for Event: medication regimen and diet  Comments/MD notified:protocol    Garland Smouse L

## 2016-07-11 NOTE — Progress Notes (Signed)
PROGRESS NOTE    [name].  [id number] DOB: September 11, 1934 DOA: 07/03/2016  PCP: [name], MD   Brief Narrative:  [name]. is a 82 y.o. male with medical history significant of dementia, poorly controlled DM, severe AS status post redo aVR (bioprosthetic valve), chronic diastolic heart failure, CAD status post CABG, chronic valvular atrial fibrillation on Coumadin, history of left atrial thrombus noted on TEE in 7/12, history of peripheral vascular disease s/p PTCA of R tibial artery and Left SFA stent placement,  amputation of L great toe and subsequent left foot ulcer with osteomyelitis on Keflex was referred from the infectious disease MDs office for evaluation of hypoxia.  Subjective: No complaints today.   Assessment & Plan:   Principal Problem:   Acute diastolic CHF (congestive heart failure), acute hypoxic resp failure - started IV lasix- diuresed well- pulse ox 97% on room air -switched Lasix to oral home dose of 80 in AM and 40 in PM but on 1/17, exam consistent with recurrence of effusion- CXR confirmed moderate sized R effusion although his weight had only gone up by 1 kg -- resumed IV Lasix TID- change Aldactone to BID-  follow for improvement - cont Lisinopril -  Following  Cr, I and O, daily wieghts-    Active Problems: Right Pleural effusion - as above - thoracentesis 1/12 with removal of 1.8 L yellow fluid - per Light's criteria, fluid is transudative - no further work up needed    Diabetic 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 with PVD -  Has been on Keflex at home but wound continues to be infected which is why I have re-consulted ID and Vascular sugery -    Angiogram completed- has single vessel run off to left foot - ID has discontinued antibiotics for now- vascular surgery plans for amputation next week - wound care consulted  Candida dermatitis of groin - Fluconazole started by ID- added Nystatin powder     Chronic atrial fibrillation -  CHA2DS2-VASc Score at least 6 - not on rate controlling agent- holding Coumain for procedures - Heparin started  H/o atrial thrombus - Coumadin- switched to Heparin for bridging    S/P AVR (aortic valve replacement) - bioprosthetic    PACEMAKER, PERMANENT/   Complete heart block   CAD s/p CABG - not on ASA- cont statin    Malnutrition of moderate degree - supplements ordered  Dementia - no behavioral disturbances   DVT prophylaxis: Coumadin Code Status: Full code Family Communication: wife Disposition Plan: home when stable Consultants:   IR Procedures:   Thoracentesis  Antimicrobials:  Anti-infectives    Start     Dose/Rate Route Frequency Ordered Stop   07/14/16 0600  ceFAZolin (ANCEF) IVPB 1 g/50 mL premix    Comments:  Send with pt to OR   1 g 100 mL/hr over 30 Minutes Intravenous To Short Stay 07/11/16 0820 07/15/16 0600   07/05/16 1330  fluconazole (DIFLUCAN) tablet 100 mg     100 mg Oral Daily 07/05/16 1231     07/04/16 2200  vancomycin (VANCOCIN) 1,750 mg in sodium chloride 0.9 % 500 mL IVPB  Status:  Discontinued     1,750 mg 250 mL/hr over 120 Minutes Intravenous Every 24 hours 07/04/16 2132 07/05/16 1231   07/03/16 1930  cephALEXin (KEFLEX) capsule 500 mg  Status:  Discontinued     500 mg Oral Every 8 hours 07/03/16 1835 07/05/16 1458       Objective: Vitals:  07/10/16 1501 07/10/16 2041 07/10/16 2100 07/11/16 0510  BP: (!) 100/50 (!) 114/43  (!) 123/50  Pulse: 60 61  60  Resp:  18  18  Temp:  97.5 F (36.4 C)  98.4 F (36.9 C)  TempSrc:  Oral  Oral  SpO2:  90% 95% 96%  Weight:    86.7 kg (191 lb 3.2 oz)  Height:        Intake/Output Summary (Last 24 hours) at 07/11/16 1043 Last data filed at 07/11/16 0738  Gross per 24 hour  Intake           592.23 ml  Output             2580 ml  Net         -1987.77 ml   Filed Weights   07/09/16 0636 07/10/16 0630 07/11/16 0510  Weight: 88.7 kg (195 lb 9.6 oz) 87.4 kg (192 lb 9.6 oz) 86.7 kg (191  lb 3.2 oz)    Examination: General exam: Appears comfortable  HEENT: PERRLA, oral mucosa moist, no sclera icterus or thrush Respiratory system: CTA b/l  Absent breath sound in R lower and mid lung fied - Respiratory effort normal. Cardiovascular system: S1 & S2 heard, RRR.  No murmurs  Gastrointestinal system: Abdomen soft, non-tender, nondistended. Normal bowel sound. No organomegaly Central nervous system: Alert and oriented. No focal neurological deficits. Extremities: No cyanosis, clubbing or edema Skin: wound on left foot at head of L metatarsal with exposed bone Psychiatry:  Mood & affect appropriate.     Data Reviewed: I have personally reviewed following labs and imaging studies  CBC:  Recent Labs Lab 07/06/16 0433 07/07/16 0545 07/08/16 0304 07/09/16 0411 07/10/16 0420 07/11/16 0549  WBC 12.9* 11.2* 10.5 10.7* 10.9* 10.8*  NEUTROABS 9.6*  --   --   --   --   --   HGB 12.3* 12.6* 11.8* 11.7* 11.8* 11.7*  HCT 37.6* 38.6* 36.7* 36.3* 37.5* 37.0*  MCV 87.9 88.3 88.0 87.7 88.9 87.9  PLT 228 223 216 211 227 [id number]   Basic Metabolic Panel:  Recent Labs Lab 07/07/16 0545 07/08/16 0304 07/09/16 0411 07/10/16 0420 07/11/16 0549  NA 136 135 135 135 135  K 4.0 4.9 4.2 4.5 4.1  CL 93* 93* 95* 94* 94*  CO2 35* 32 29 32 35*  GLUCOSE 93 121* 136* 114* 79  BUN 44* 50* 41* 37* 33*  CREATININE 1.29* 1.28* 1.21 1.09 1.13  CALCIUM 10.1 9.7 9.9 10.0 10.2   GFR: Estimated Creatinine Clearance: 54.7 mL/min (by C-G formula based on SCr of 1.13 mg/dL). Liver Function Tests: No results for input(s): AST, ALT, ALKPHOS, BILITOT, PROT, ALBUMIN in the last 168 hours. No results for input(s): LIPASE, AMYLASE in the last 168 hours. No results for input(s): AMMONIA in the last 168 hours. Coagulation Profile:  Recent Labs Lab 07/07/16 0545 07/08/16 0304 07/09/16 0411 07/10/16 0420 07/11/16 0549  INR 1.78 1.65 1.49 1.28 1.31   Cardiac Enzymes: No results for input(s):  CKTOTAL, CKMB, CKMBINDEX, TROPONINI in the last 168 hours. BNP (last 3 results) No results for input(s): PROBNP in the last 8760 hours. HbA1C: No results for input(s): HGBA1C in the last 72 hours. CBG:  Recent Labs Lab 07/10/16 0629 07/10/16 1235 07/10/16 1645 07/10/16 2145 07/11/16 0537  GLUCAP 116* 101* 189* 178* 78   Lipid Profile: No results for input(s): CHOL, HDL, LDLCALC, TRIG, CHOLHDL, LDLDIRECT in the last 72 hours. Thyroid Function Tests: No results for input(s): TSH, T4TOTAL,  FREET4, T3FREE, THYROIDAB in the last 72 hours. Anemia Panel: No results for input(s): VITAMINB12, FOLATE, FERRITIN, TIBC, IRON, RETICCTPCT in the last 72 hours. Urine analysis:    Component Value Date/Time   COLORURINE YELLOW 01/14/2011 1149   APPEARANCEUR CLEAR 01/14/2011 1149   LABSPEC 1.011 01/14/2011 1149   PHURINE 7.0 01/14/2011 1149   GLUCOSEU NEGATIVE 01/14/2011 1149   HGBUR NEGATIVE 01/14/2011 1149   BILIRUBINUR NEGATIVE 01/14/2011 1149   KETONESUR NEGATIVE 01/14/2011 1149   PROTEINUR NEGATIVE 01/14/2011 1149   UROBILINOGEN 1.0 01/14/2011 1149   NITRITE NEGATIVE 01/14/2011 1149   LEUKOCYTESUR TRACE (A) 01/14/2011 1149   Sepsis Labs: @LABRCNTIP (procalcitonin:4,lacticidven:4) ) Recent Results (from the past 240 hour(s))  Culture, blood (routine x 2)     Status: Abnormal   Collection Time: 07/03/16  9:40 PM  Result Value Ref Range Status   Specimen Description BLOOD RIGHT ARM  Final   Special Requests BOTTLES DRAWN AEROBIC AND ANAEROBIC 5ML  Final   Culture  Setup Time   Final    GRAM POSITIVE COCCI IN CLUSTERS IN BOTH AEROBIC AND ANAEROBIC BOTTLES CRITICAL RESULT CALLED TO, READ BACK BY AND VERIFIED WITH: T. DANG, PHARMD, 07/04/16 2117 BY J. FUDESCO    Culture (A)  Final    STAPHYLOCOCCUS SPECIES (COAGULASE NEGATIVE) THE SIGNIFICANCE OF ISOLATING THIS ORGANISM FROM A SINGLE SET OF BLOOD CULTURES WHEN MULTIPLE SETS ARE DRAWN IS UNCERTAIN. PLEASE NOTIFY THE MICROBIOLOGY  DEPARTMENT WITHIN ONE WEEK IF SPECIATION AND SENSITIVITIES ARE REQUIRED.    Report Status 07/06/2016 FINAL  Final  Blood Culture ID Panel (Reflexed)     Status: Abnormal   Collection Time: 07/03/16  9:40 PM  Result Value Ref Range Status   Enterococcus species NOT DETECTED NOT DETECTED Final   Listeria monocytogenes NOT DETECTED NOT DETECTED Final   Staphylococcus species DETECTED (A) NOT DETECTED Final    Comment: CRITICAL RESULT CALLED TO, READ BACK BY AND VERIFIED WITH: T. DANG PHARMD 07/04/16 2117 BY J FUDESCO    Staphylococcus aureus NOT DETECTED NOT DETECTED Final   Methicillin resistance DETECTED (A) NOT DETECTED Final    Comment: CRITICAL RESULT CALLED TO, READ BACK BY AND VERIFIED WITH: T. DANG PHARMD 07/04/16 2117 BY J. FUDESCO    Streptococcus species NOT DETECTED NOT DETECTED Final   Streptococcus agalactiae NOT DETECTED NOT DETECTED Final   Streptococcus pneumoniae NOT DETECTED NOT DETECTED Final   Streptococcus pyogenes NOT DETECTED NOT DETECTED Final   Acinetobacter baumannii NOT DETECTED NOT DETECTED Final   Enterobacteriaceae species NOT DETECTED NOT DETECTED Final   Enterobacter cloacae complex NOT DETECTED NOT DETECTED Final   Escherichia coli NOT DETECTED NOT DETECTED Final   Klebsiella oxytoca NOT DETECTED NOT DETECTED Final   Klebsiella pneumoniae NOT DETECTED NOT DETECTED Final   Proteus species NOT DETECTED NOT DETECTED Final   Serratia marcescens NOT DETECTED NOT DETECTED Final   Haemophilus influenzae NOT DETECTED NOT DETECTED Final   Neisseria meningitidis NOT DETECTED NOT DETECTED Final   Pseudomonas aeruginosa NOT DETECTED NOT DETECTED Final   Candida albicans NOT DETECTED NOT DETECTED Final   Candida glabrata NOT DETECTED NOT DETECTED Final   Candida krusei NOT DETECTED NOT DETECTED Final   Candida parapsilosis NOT DETECTED NOT DETECTED Final   Candida tropicalis NOT DETECTED NOT DETECTED Final  Culture, blood (routine x 2)     Status: None    Collection Time: 07/03/16  9:46 PM  Result Value Ref Range Status   Specimen Description BLOOD LEFT ARM  Final  Special Requests BOTTLES DRAWN AEROBIC AND ANAEROBIC 5ML  Final   Culture NO GROWTH 5 DAYS  Final   Report Status 07/09/2016 FINAL  Final  Culture, body fluid-bottle     Status: None   Collection Time: 07/04/16 10:34 AM  Result Value Ref Range Status   Specimen Description FLUID PLEURAL RIGHT  Final   Special Requests NONE  Final   Culture NO GROWTH 5 DAYS  Final   Report Status 07/09/2016 FINAL  Final  Gram stain     Status: None   Collection Time: 07/04/16 10:34 AM  Result Value Ref Range Status   Specimen Description FLUID PLEURAL RIGHT  Final   Special Requests NONE  Final   Gram Stain   Final    RARE WBC PRESENT, PREDOMINANTLY MONONUCLEAR NO ORGANISMS SEEN    Report Status 07/04/2016 FINAL  Final  Blood Cultures x 2 sites     Status: None   Collection Time: 07/05/16  3:34 PM  Result Value Ref Range Status   Specimen Description BLOOD RIGHT ARM  Final   Special Requests BOTTLES DRAWN AEROBIC AND ANAEROBIC 4CC  Final   Culture NO GROWTH 5 DAYS  Final   Report Status 07/10/2016 FINAL  Final  Blood Cultures x 2 sites     Status: None   Collection Time: 07/05/16  3:44 PM  Result Value Ref Range Status   Specimen Description BLOOD LEFT ARM  Final   Special Requests BOTTLES DRAWN AEROBIC AND ANAEROBIC 5CC  Final   Culture NO GROWTH 5 DAYS  Final   Report Status 07/10/2016 FINAL  Final         Radiology Studies: No results found.    Scheduled Meds: . [START ON 07/14/2016]  ceFAZolin (ANCEF) IV  1 g Intravenous To SSTC  . collagenase   Topical Daily  . donepezil  10 mg Oral QHS  . ezetimibe-simvastatin  1 tablet Oral QHS  . feeding supplement (ENSURE ENLIVE)  237 mL Oral Q24H  . feeding supplement (PRO-STAT SUGAR FREE 64)  30 mL Oral BID  . fluconazole  100 mg Oral Daily  . furosemide  40 mg Intravenous Q8H  . insulin aspart  12 Units Subcutaneous TID WC   . insulin glargine  13 Units Subcutaneous QHS  . lisinopril  10 mg Oral Daily  . multivitamin with minerals  1 tablet Oral Daily  . nystatin   Topical TID  . sodium chloride flush  3 mL Intravenous Q12H  . spironolactone  25 mg Oral BID   Continuous Infusions: . heparin 1,500 Units/hr (07/11/16 0815)     LOS: 8 days    Time spent in minutes: 24    [place], MD [facility] Pager: [email or web address] Password [facility] 07/11/2016, 10:43 AM

## 2016-07-11 NOTE — Progress Notes (Signed)
Pharmacy Antibiotic Note  [name]. is a 82 y.o. male admitted on 07/03/2016 with dyspnea.  Patient has a history of diabetic osteomyelitis on Keflex PTA.  Antibiotic was placed on hold for a possible bone biopsy.  Now s/p angiogram and the plan is for patient to undergo first metatarsal amputation on 07/14/16.  Pharmacy consulted to start Ancef with plan to continue through 3 days post-op and then transition patient back to oral Keflex on discharge.  Patient's renal function has been stable.   Plan: - Ancef 2gm IV Q8H - D/C Ancef oncall to OR on 07/14/16 - Pharmacy will sign off and follow peripherally.  Thank you for the consult!   Height: 5\' 10"  (177.8 cm) Weight: 191 lb 3.2 oz (86.7 kg) (scale b) IBW/kg (Calculated) : 73  Temp (24hrs), Avg:98 F (36.7 C), Min:97.5 F (36.4 C), Max:98.4 F (36.9 C)   Recent Labs Lab 07/07/16 0545 07/08/16 0304 07/09/16 0411 07/10/16 0420 07/11/16 0549  WBC 11.2* 10.5 10.7* 10.9* 10.8*  CREATININE 1.29* 1.28* 1.21 1.09 1.13    Estimated Creatinine Clearance: 54.7 mL/min (by C-G formula based on SCr of 1.13 mg/dL).    Allergies  Allergen Reactions  . Potassium-Containing Compounds Anaphylaxis    IV--loss of conciousness  . Metoprolol Succinate Other (See Comments)    Extremely tired     Antimicrobials this admission:  Vanc x1 1/12 Keflex PTA >> 1/13 (by ID) Fluc 1/13 >> Ancef 1/19 >>  Dose adjustments this admission:  N/A  Microbiology results:  12/15 MRSA PCR: neg 1/11 BCx: CoNS (1/2 BCID MRSE - ID says likely contaminant) 1/12 pleural fluid - negative 1/13 BCx: negative   Emeli Goguen D. Mina Marble, PharmD, BCPS Pager:  [phone] 07/11/2016, 11:37 AM

## 2016-07-11 NOTE — Progress Notes (Signed)
[facility] for Infectious [facility]   Reason for visit: Follow up on osteomyelitis  Interval History: Vascular studies done and now plan for 1st MTP amputation on Monday; s/p bilateral common femoral artery cannulation; no fever  Physical Exam: Constitutional:  Vitals:   07/10/16 2041 07/11/16 0510  BP: (!) 114/43 (!) 123/50  Pulse: 61 60  Resp: 18 18  Temp: 97.5 F (36.4 C) 98.4 F (36.9 C)   patient appears in NAD Respiratory: Normal respiratory effort; CTA B Cardiovascular: RRR GI: soft, nt  Review of Systems: Constitutional: negative for fevers, chills and fatigue Gastrointestinal: negative for diarrhea Integument/breast: negative for rash  Lab Results  Component Value Date   WBC 10.8 (H) 07/11/2016   HGB 11.7 (L) 07/11/2016   HCT 37.0 (L) 07/11/2016   MCV 87.9 07/11/2016   PLT 229 07/11/2016    Lab Results  Component Value Date   CREATININE 1.13 07/11/2016   BUN 33 (H) 07/11/2016   NA 135 07/11/2016   K 4.1 07/11/2016   CL 94 (L) 07/11/2016   CO2 35 (H) 07/11/2016    Lab Results  Component Value Date   ALT 25 07/04/2016   AST 29 07/04/2016   ALKPHOS 129 (H) 07/04/2016     Microbiology: Recent Results (from the past 240 hour(s))  Culture, blood (routine x 2)     Status: Abnormal   Collection Time: 07/03/16  9:40 PM  Result Value Ref Range Status   Specimen Description BLOOD RIGHT ARM  Final   Special Requests BOTTLES DRAWN AEROBIC AND ANAEROBIC 5ML  Final   Culture  Setup Time   Final    GRAM POSITIVE COCCI IN CLUSTERS IN BOTH AEROBIC AND ANAEROBIC BOTTLES CRITICAL RESULT CALLED TO, READ BACK BY AND VERIFIED WITH: T. DANG, PHARMD, 07/04/16 2117 BY J. FUDESCO    Culture (A)  Final    STAPHYLOCOCCUS SPECIES (COAGULASE NEGATIVE) THE SIGNIFICANCE OF ISOLATING THIS ORGANISM FROM A SINGLE SET OF BLOOD CULTURES WHEN MULTIPLE SETS ARE DRAWN IS UNCERTAIN. PLEASE NOTIFY THE MICROBIOLOGY DEPARTMENT WITHIN ONE WEEK IF SPECIATION AND SENSITIVITIES ARE  REQUIRED.    Report Status 07/06/2016 FINAL  Final  Blood Culture ID Panel (Reflexed)     Status: Abnormal   Collection Time: 07/03/16  9:40 PM  Result Value Ref Range Status   Enterococcus species NOT DETECTED NOT DETECTED Final   Listeria monocytogenes NOT DETECTED NOT DETECTED Final   Staphylococcus species DETECTED (A) NOT DETECTED Final    Comment: CRITICAL RESULT CALLED TO, READ BACK BY AND VERIFIED WITH: T. DANG PHARMD 07/04/16 2117 BY J FUDESCO    Staphylococcus aureus NOT DETECTED NOT DETECTED Final   Methicillin resistance DETECTED (A) NOT DETECTED Final    Comment: CRITICAL RESULT CALLED TO, READ BACK BY AND VERIFIED WITH: T. DANG PHARMD 07/04/16 2117 BY J. FUDESCO    Streptococcus species NOT DETECTED NOT DETECTED Final   Streptococcus agalactiae NOT DETECTED NOT DETECTED Final   Streptococcus pneumoniae NOT DETECTED NOT DETECTED Final   Streptococcus pyogenes NOT DETECTED NOT DETECTED Final   Acinetobacter baumannii NOT DETECTED NOT DETECTED Final   Enterobacteriaceae species NOT DETECTED NOT DETECTED Final   Enterobacter cloacae complex NOT DETECTED NOT DETECTED Final   Escherichia coli NOT DETECTED NOT DETECTED Final   Klebsiella oxytoca NOT DETECTED NOT DETECTED Final   Klebsiella pneumoniae NOT DETECTED NOT DETECTED Final   Proteus species NOT DETECTED NOT DETECTED Final   Serratia marcescens NOT DETECTED NOT DETECTED Final   Haemophilus  influenzae NOT DETECTED NOT DETECTED Final   Neisseria meningitidis NOT DETECTED NOT DETECTED Final   Pseudomonas aeruginosa NOT DETECTED NOT DETECTED Final   Candida albicans NOT DETECTED NOT DETECTED Final   Candida glabrata NOT DETECTED NOT DETECTED Final   Candida krusei NOT DETECTED NOT DETECTED Final   Candida parapsilosis NOT DETECTED NOT DETECTED Final   Candida tropicalis NOT DETECTED NOT DETECTED Final  Culture, blood (routine x 2)     Status: None   Collection Time: 07/03/16  9:46 PM  Result Value Ref Range Status    Specimen Description BLOOD LEFT ARM  Final   Special Requests BOTTLES DRAWN AEROBIC AND ANAEROBIC 5ML  Final   Culture NO GROWTH 5 DAYS  Final   Report Status 07/09/2016 FINAL  Final  Culture, body fluid-bottle     Status: None   Collection Time: 07/04/16 10:34 AM  Result Value Ref Range Status   Specimen Description FLUID PLEURAL RIGHT  Final   Special Requests NONE  Final   Culture NO GROWTH 5 DAYS  Final   Report Status 07/09/2016 FINAL  Final  Gram stain     Status: None   Collection Time: 07/04/16 10:34 AM  Result Value Ref Range Status   Specimen Description FLUID PLEURAL RIGHT  Final   Special Requests NONE  Final   Gram Stain   Final    RARE WBC PRESENT, PREDOMINANTLY MONONUCLEAR NO ORGANISMS SEEN    Report Status 07/04/2016 FINAL  Final  Blood Cultures x 2 sites     Status: None   Collection Time: 07/05/16  3:34 PM  Result Value Ref Range Status   Specimen Description BLOOD RIGHT ARM  Final   Special Requests BOTTLES DRAWN AEROBIC AND ANAEROBIC 4CC  Final   Culture NO GROWTH 5 DAYS  Final   Report Status 07/10/2016 FINAL  Final  Blood Cultures x 2 sites     Status: None   Collection Time: 07/05/16  3:44 PM  Result Value Ref Range Status   Specimen Description BLOOD LEFT ARM  Final   Special Requests BOTTLES DRAWN AEROBIC AND ANAEROBIC 5CC  Final   Culture NO GROWTH 5 DAYS  Final   Report Status 07/10/2016 FINAL  Final    Impression/Plan:  1. Osteomyelitis - for amputation on Monday.  Since he will not require bone biopsy, I will go ahead and start cefazolin IV and continue this through surgery and for 3 days post op.  Can use Keflex 500 mg qid to complete at discharge if needed, will not need picc line He will not need further follow up with me  2. Positive blood culture - 1/2 with CoNS, most c/w contaminate  Thanks and I will sign off

## 2016-07-11 NOTE — Progress Notes (Signed)
ANTICOAGULATION CONSULT NOTE - FOLLOW UP  Pharmacy Consult:  Heparin Indication:  History of Afib  Allergies  Allergen Reactions  . Potassium-Containing Compounds Anaphylaxis    IV--loss of conciousness  . Metoprolol Succinate Other (See Comments)    Extremely tired    Patient Measurements: Height: 5\' 10"  (177.8 cm) Weight: 191 lb 3.2 oz (86.7 kg) (scale b) IBW/kg (Calculated) : 73 Heparin Dosing Weight: 88 kg  Vital Signs: Temp: 97.7 F (36.5 C) (01/19 1222) Temp Source: Oral (01/19 1222) BP: 112/54 (01/19 1222) Pulse Rate: 60 (01/19 1222)  Labs:  Recent Labs  07/09/16 0411 07/10/16 0420 07/11/16 0549 07/11/16 1551  HGB 11.7* 11.8* 11.7*  --   HCT 36.3* 37.5* 37.0*  --   PLT 211 227 229  --   LABPROT 18.1* 16.0* 16.4*  --   INR 1.49 1.28 1.31  --   HEPARINUNFRC 0.64 0.37 0.25* 0.30  CREATININE 1.21 1.09 1.13  --     Estimated Creatinine Clearance: 54.7 mL/min (by C-G formula based on SCr of 1.13 mg/dL).    Assessment: 42 YOM on Coumadin PTA for history of Afib.  He also has a history of bioprosthetic AVR and LAA thrombus.  Coumadin has been on hold since admission and bridged with IV heparin since 07/07/16.  He is now s/p angiogram on 07/10/16 and the current plan is for amputation next week  PM heparin level now therapeutic at 0.30   Goal of Therapy:  Heparin level 0.3-0.7 units/ml Monitor platelets by anticoagulation protocol: Yes    Plan:  - Increase heparin gtt to 1600 units/hr - Daily heparin level and CBC  Thank you Anette Guarneri, PharmD [phone] 07/11/2016, 4:57 PM

## 2016-07-11 NOTE — Progress Notes (Signed)
[facility]  Subjective  - Overall he is doing well.   Objective (!) 123/50 60 98.4 F (36.9 C) (Oral) 18 96%  Intake/Output Summary (Last 24 hours) at 07/11/16 0825 Last data filed at 07/11/16 [id number]  Gross per 24 hour  Intake           592.23 ml  Output             2580 ml  Net         -1987.77 ml   Left groin soft  Left foot medial ulceration at 1st metatarsal head  Assessment/Planning: POD # 1 PROCEDURE: 1.  Bilateral common femoral artery cannulation under ultrasound guidance 2.  Placement of catheter in aorta 3.  Aortogram 4.  Bilateral leg runoff via catheter  Pre-op for left first MT amputation Mon.  Patient is in agreement about the plan.    Laurence Slate [facility] 07/11/2016 8:25 AM --  Laboratory Lab Results:  Recent Labs  07/10/16 0420 07/11/16 0549  WBC 10.9* 10.8*  HGB 11.8* 11.7*  HCT 37.5* 37.0*  PLT 227 229   BMET  Recent Labs  07/10/16 0420 07/11/16 0549  NA 135 135  K 4.5 4.1  CL 94* 94*  CO2 32 35*  GLUCOSE 114* 79  BUN 37* 33*  CREATININE 1.09 1.13  CALCIUM 10.0 10.2    COAG Lab Results  Component Value Date   INR 1.31 07/11/2016   INR 1.28 07/10/2016   INR 1.49 07/09/2016   PROTIME 16.2 12/05/2008   No results found for: PTT

## 2016-07-11 NOTE — Care Management Important Message (Signed)
Important Message  Patient Details  Name: [name]. MRN: [id number] Date of Birth: 24-May-1935   Medicare Important Message Given:  Yes    Adler Alton 07/11/2016, 1:12 PM

## 2016-07-11 NOTE — Progress Notes (Signed)
PT Cancellation Note  Patient Details Name: [name]. MRN: [id number] DOB: Mar 14, 1935   Cancelled Treatment:    Reason Eval/Treat Not Completed: Patient declined, no reason specified.  Nursing notified that pt refused, and they will ck on him later to walk.   Ramond Dial 07/11/2016, 1:58 PM   Mee Hives, PT MS Acute Rehab Dept. Number: [place] and [place]

## 2016-07-12 ENCOUNTER — Inpatient Hospital Stay (HOSPITAL_COMMUNITY): Payer: Medicare Other

## 2016-07-12 LAB — PROTIME-INR
INR: 1.33
PROTHROMBIN TIME: 16.6 s — AB (ref 11.4–15.2)

## 2016-07-12 LAB — GLUCOSE, CAPILLARY
GLUCOSE-CAPILLARY: 166 mg/dL — AB (ref 65–99)
GLUCOSE-CAPILLARY: 107 mg/dL — AB (ref 65–99)
Glucose-Capillary: 103 mg/dL — ABNORMAL HIGH (ref 65–99)
Glucose-Capillary: 112 mg/dL — ABNORMAL HIGH (ref 65–99)

## 2016-07-12 LAB — CBC
HCT: 38.1 % — ABNORMAL LOW (ref 39.0–52.0)
Hemoglobin: 12 g/dL — ABNORMAL LOW (ref 13.0–17.0)
MCH: 27.8 pg (ref 26.0–34.0)
MCHC: 31.5 g/dL (ref 30.0–36.0)
MCV: 88.2 fL (ref 78.0–100.0)
PLATELETS: 235 10*3/uL (ref 150–400)
RBC: 4.32 MIL/uL (ref 4.22–5.81)
RDW: 14.9 % (ref 11.5–15.5)
WBC: 11.4 10*3/uL — AB (ref 4.0–10.5)

## 2016-07-12 LAB — BASIC METABOLIC PANEL
ANION GAP: 10 (ref 5–15)
BUN: 37 mg/dL — ABNORMAL HIGH (ref 6–20)
CALCIUM: 10.3 mg/dL (ref 8.9–10.3)
CO2: 33 mmol/L — ABNORMAL HIGH (ref 22–32)
Chloride: 91 mmol/L — ABNORMAL LOW (ref 101–111)
Creatinine, Ser: 1.29 mg/dL — ABNORMAL HIGH (ref 0.61–1.24)
GFR, EST AFRICAN AMERICAN: 59 mL/min — AB (ref >60)
GFR, EST NON AFRICAN AMERICAN: 51 mL/min — AB (ref >60)
GLUCOSE: 161 mg/dL — AB (ref 65–99)
Potassium: 4.2 mmol/L (ref 3.5–5.1)
SODIUM: 134 mmol/L — AB (ref 135–145)

## 2016-07-12 LAB — HEPARIN LEVEL (UNFRACTIONATED)
HEPARIN UNFRACTIONATED: 0.32 [IU]/mL (ref 0.30–0.70)
HEPARIN UNFRACTIONATED: 0.55 [IU]/mL (ref 0.30–0.70)

## 2016-07-12 MED ORDER — INSULIN GLARGINE 100 UNIT/ML ~~LOC~~ SOLN
12.0000 [IU] | Freq: Every day | SUBCUTANEOUS | Status: DC
  Administered 2016-07-13 – 2016-07-20 (×7): 12 [IU] via SUBCUTANEOUS
  Filled 2016-07-12 (×11): qty 0.12

## 2016-07-12 NOTE — Progress Notes (Signed)
ANTICOAGULATION CONSULT NOTE - FOLLOW UP  Pharmacy Consult:  Heparin Indication:  History of Afib  Allergies  Allergen Reactions  . Potassium-Containing Compounds Anaphylaxis    IV--loss of conciousness  . Metoprolol Succinate Other (See Comments)    Extremely tired    Patient Measurements: Height: 5\' 10"  (177.8 cm) Weight: 191 lb 8 oz (86.9 kg) (scale b) IBW/kg (Calculated) : 73 Heparin Dosing Weight: 88 kg  Vital Signs: Temp: 97.3 F (36.3 C) (01/20 2040) Temp Source: Oral (01/20 2040) BP: 97/38 (01/20 2040) Pulse Rate: 60 (01/20 2040)  Labs:  Recent Labs  07/10/16 0420 07/11/16 0549 07/11/16 1551 07/12/16 0654 07/12/16 0655 07/12/16 2030  HGB 11.8* 11.7*  --  12.0*  --   --   HCT 37.5* 37.0*  --  38.1*  --   --   PLT 227 229  --  235  --   --   LABPROT 16.0* 16.4*  --  16.6*  --   --   INR 1.28 1.31  --  1.33  --   --   HEPARINUNFRC 0.37 0.25* 0.30 0.32  --  0.55  CREATININE 1.09 1.13  --   --  1.29*  --     Estimated Creatinine Clearance: 47.9 mL/min (by C-G formula based on SCr of 1.29 mg/dL (H)).    Assessment: 35 YOM on Coumadin PTA for history of Afib.  He also has a history of bioprosthetic AVR and LAA thrombus.  Coumadin has been on hold since admission and bridged with IV heparin since 07/07/16.  He is now s/p angiogram on 07/10/16 and the current plan is for amputation next week  Heparin level remains therapeutic at 0.55 after dose increase to 1700 units/hr.  Goal of Therapy:  Heparin level 0.3-0.7 units/ml Monitor platelets by anticoagulation protocol: Yes    Plan:  - Continue heparin infusion at 1700 units/hr - Daily heparin level and CBC - monitor s/sx of bleeding  Vincenza Hews, PharmD, BCPS 07/12/2016, 9:38 PM

## 2016-07-12 NOTE — Progress Notes (Signed)
ANTICOAGULATION CONSULT NOTE - FOLLOW UP  Pharmacy Consult:  Heparin Indication:  History of Afib  Allergies  Allergen Reactions  . Potassium-Containing Compounds Anaphylaxis    IV--loss of conciousness  . Metoprolol Succinate Other (See Comments)    Extremely tired    Patient Measurements: Height: 5\' 10"  (177.8 cm) Weight: 191 lb 8 oz (86.9 kg) (scale b) IBW/kg (Calculated) : 73 Heparin Dosing Weight: 88 kg  Vital Signs: Temp: 97.9 F (36.6 C) (01/20 0732) Temp Source: Oral (01/20 0732) BP: 121/50 (01/20 0732) Pulse Rate: 59 (01/20 0732)  Labs:  Recent Labs  07/10/16 0420 07/11/16 0549 07/11/16 1551 07/12/16 0654 07/12/16 0655  HGB 11.8* 11.7*  --  12.0*  --   HCT 37.5* 37.0*  --  38.1*  --   PLT 227 229  --  235  --   LABPROT 16.0* 16.4*  --  16.6*  --   INR 1.28 1.31  --  1.33  --   HEPARINUNFRC 0.37 0.25* 0.30 0.32  --   CREATININE 1.09 1.13  --   --  1.29*    Estimated Creatinine Clearance: 47.9 mL/min (by C-G formula based on SCr of 1.29 mg/dL (H)).    Assessment: 41 YOM on Coumadin PTA for history of Afib.  He also has a history of bioprosthetic AVR and LAA thrombus.  Coumadin has been on hold since admission and bridged with IV heparin since 07/07/16.  He is now s/p angiogram on 07/10/16 and the current plan is for amputation next week  INR ~1.3 for last 3 days. HL slightly therapeutic after dose increase. Will increase dose further and check confirmatory.    Goal of Therapy:  Heparin level 0.3-0.7 units/ml Monitor platelets by anticoagulation protocol: Yes    Plan:  - Increase heparin gtt to 1700 units/hr - Daily heparin level and CBC - monitor s/sx of bleeding  Dierdre Harness, Cain Sieve, PharmD Clinical Pharmacy Resident [phone] (Pager) 07/12/2016 10:22 AM

## 2016-07-12 NOTE — Progress Notes (Signed)
There was a long conversation with bunch of family members today regarding pt's update, this evening pt's wife wants to talk with the MD regarding plan of care, MD called and wife talked over phone, according to the MD, vascular doctor will be here tomorrow am, and going to talk with pt's wife in person or over phone, heparin is continue at 17cc/hr, will continue to monitor the patient.  Palma Holter, RN

## 2016-07-12 NOTE — Progress Notes (Signed)
Pt's BP was initially 110/55, Lasix and Spironolactone ordered for 2200. CBG was 112,( pt had a hypoglycemic event  0n 07/11/2016) Lantus 12 units ordered as well. Paged MD on call, D. Olevia Bowens. And asked should this be given. Insulin and Lasix should be held and Spironolactone should be given per verbal order. Will continue to monitor.   Nereida Schepp, RN

## 2016-07-12 NOTE — Progress Notes (Signed)
PROGRESS NOTE    Seth Pacific.  SG:[id number] DOB: 29-Oct-1934 DOA: 07/03/2016  PCP: [name], MD   Brief Narrative:  Seth Anklam. is a 82 y.o. male with medical history significant of dementia, poorly controlled DM, severe AS status post redo aVR (bioprosthetic valve), chronic diastolic heart failure, CAD status post CABG, chronic valvular atrial fibrillation on Coumadin, history of left atrial thrombus noted on TEE in 7/12, history of peripheral vascular disease s/p PTCA of R tibial artery and Left SFA stent placement,  amputation of L great toe and subsequent left foot ulcer with osteomyelitis on Keflex was referred from the infectious disease MDs office for evaluation of hypoxia.  Subjective: No complaints today.   Assessment & Plan:   Principal Problem:   Acute diastolic CHF (congestive heart failure), acute hypoxic resp failure - cont Lisinopril -  Following  Cr, I and O, daily wieghts-   - started IV lasix- diuresed well- pulse ox 97% on room air  -switched Lasix to oral home dose of 80 in AM and 40 in PM but on 1/17, exam consistent with recurrence of effusion- CXR confirmed recurrence of moderate sized R effusion although his weight had only gone up by 1 kg -- resumed IV Lasix TID- changed Aldactone to BID-  following for improvement - Cr has risen today- repeat CXR today to assess effusion  Active Problems: Right Pleural effusion - as above - thoracentesis 1/12 with removal of 1.8 L yellow fluid - per Light's criteria, fluid is transudative - no further work up needed    Diabetic 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 with PVD -  Has been on Keflex at home but wound continues to be infected which is why I have re-consulted ID and Vascular sugery -    Angiogram completed- has single vessel run off to left foot - vascular surgery plans for amputation next week - wound care consulted  Candida dermatitis of groin - Fluconazole started by ID- added Nystatin powder       Chronic atrial fibrillation - CHA2DS2-VASc Score at least 6 - not on rate controlling agent- holding Coumain for procedures - Heparin started  H/o atrial thrombus - on Coumadin- switched to Heparin for bridging  S/P AVR (aortic valve replacement) - bioprosthetic    PACEMAKER, PERMANENT/   Complete heart block   CAD s/p CABG - not on ASA- cont statin    Malnutrition of moderate degree - supplements ordered  Dementia - very poor short term memory- no behavioral disturbances   DVT prophylaxis: Coumadin Code Status: Full code Family Communication: wife Disposition Plan: home when stable Consultants:   IR Procedures:   Thoracentesis  Antimicrobials:  Anti-infectives    Start     Dose/Rate Route Frequency Ordered Stop   07/14/16 0600  ceFAZolin (ANCEF) IVPB 1 g/50 mL premix  Status:  Discontinued    Comments:  Send with pt to OR   1 g 100 mL/hr over 30 Minutes Intravenous To Short Stay 07/11/16 0820 07/11/16 1139   07/11/16 1200  ceFAZolin (ANCEF) IVPB 2g/100 mL premix     2 g 200 mL/hr over 30 Minutes Intravenous Every 8 hours 07/11/16 1138     07/05/16 1330  fluconazole (DIFLUCAN) tablet 100 mg     100 mg Oral Daily 07/05/16 1231     07/04/16 2200  vancomycin (VANCOCIN) 1,750 mg in sodium chloride 0.9 % 500 mL IVPB  Status:  Discontinued     1,750 mg 250 mL/hr over  120 Minutes Intravenous Every 24 hours 07/04/16 2132 07/05/16 1231   07/03/16 1930  cephALEXin (KEFLEX) capsule 500 mg  Status:  Discontinued     500 mg Oral Every 8 hours 07/03/16 1835 07/05/16 1458       Objective: Vitals:   07/11/16 0510 07/11/16 1222 07/11/16 1942 07/12/16 0732  BP: (!) 123/50 (!) 112/54 (!) 107/42 (!) 121/50  Pulse: 60 60 60 (!) 59  Resp: 18 16 18 16   Temp: 98.4 F (36.9 C) 97.7 F (36.5 C) 97.3 F (36.3 C) 97.9 F (36.6 C)  TempSrc: Oral Oral Oral Oral  SpO2: 96% 92% 92% 95%  Weight: 86.7 kg (191 lb 3.2 oz)   86.9 kg (191 lb 8 oz)  Height:        Intake/Output  Summary (Last 24 hours) at 07/12/16 1044 Last data filed at 07/12/16 0900  Gross per 24 hour  Intake          1374.13 ml  Output             2150 ml  Net          -775.87 ml   Filed Weights   07/10/16 0630 07/11/16 0510 07/12/16 0732  Weight: 87.4 kg (192 lb 9.6 oz) 86.7 kg (191 lb 3.2 oz) 86.9 kg (191 lb 8 oz)    Examination: General exam: Appears comfortable  HEENT: PERRLA, oral mucosa moist, no sclera icterus or thrush Respiratory system: CTA b/l  Absent breath sound in R lower and mid lung fied - Respiratory effort normal. Cardiovascular system: S1 & S2 heard, RRR.  No murmurs  Gastrointestinal system: Abdomen soft, non-tender, nondistended. Normal bowel sound. No organomegaly Central nervous system: Alert and oriented. No focal neurological deficits. Extremities: No cyanosis, clubbing or edema Skin: wound on left foot at head of L metatarsal with exposed bone Psychiatry:  Mood & affect appropriate.     Data Reviewed: I have personally reviewed following labs and imaging studies  CBC:  Recent Labs Lab 07/06/16 0433  07/08/16 0304 07/09/16 0411 07/10/16 0420 07/11/16 0549 07/12/16 0654  WBC 12.9*  < > 10.5 10.7* 10.9* 10.8* 11.4*  NEUTROABS 9.6*  --   --   --   --   --   --   HGB 12.3*  < > 11.8* 11.7* 11.8* 11.7* 12.0*  HCT 37.6*  < > 36.7* 36.3* 37.5* 37.0* 38.1*  MCV 87.9  < > 88.0 87.7 88.9 87.9 88.2  PLT 228  < > 216 211 227 229 235  < > = values in this interval not displayed. Basic Metabolic Panel:  Recent Labs Lab 07/08/16 0304 07/09/16 0411 07/10/16 0420 07/11/16 0549 07/12/16 0655  NA 135 135 135 135 134*  K 4.9 4.2 4.5 4.1 4.2  CL 93* 95* 94* 94* 91*  CO2 32 29 32 35* 33*  GLUCOSE 121* 136* 114* 79 161*  BUN 50* 41* 37* 33* 37*  CREATININE 1.28* 1.21 1.09 1.13 1.29*  CALCIUM 9.7 9.9 10.0 10.2 10.3   GFR: Estimated Creatinine Clearance: 47.9 mL/min (by C-G formula based on SCr of 1.29 mg/dL (H)). Liver Function Tests: No results for  input(s): AST, ALT, ALKPHOS, BILITOT, PROT, ALBUMIN in the last 168 hours. No results for input(s): LIPASE, AMYLASE in the last 168 hours. No results for input(s): AMMONIA in the last 168 hours. Coagulation Profile:  Recent Labs Lab 07/08/16 0304 07/09/16 0411 07/10/16 0420 07/11/16 0549 07/12/16 0654  INR 1.65 1.49 1.28 1.31 1.33   Cardiac  Enzymes: No results for input(s): CKTOTAL, CKMB, CKMBINDEX, TROPONINI in the last 168 hours. BNP (last 3 results) No results for input(s): PROBNP in the last 8760 hours. HbA1C: No results for input(s): HGBA1C in the last 72 hours. CBG:  Recent Labs Lab 07/11/16 1038 07/11/16 1641 07/11/16 2102 07/11/16 2131 07/12/16 0636  GLUCAP 128* 234* 55* 105* 166*   Lipid Profile: No results for input(s): CHOL, HDL, LDLCALC, TRIG, CHOLHDL, LDLDIRECT in the last 72 hours. Thyroid Function Tests: No results for input(s): TSH, T4TOTAL, FREET4, T3FREE, THYROIDAB in the last 72 hours. Anemia Panel: No results for input(s): VITAMINB12, FOLATE, FERRITIN, TIBC, IRON, RETICCTPCT in the last 72 hours. Urine analysis:    Component Value Date/Time   COLORURINE YELLOW 01/14/2011 1149   APPEARANCEUR CLEAR 01/14/2011 1149   LABSPEC 1.011 01/14/2011 1149   PHURINE 7.0 01/14/2011 1149   GLUCOSEU NEGATIVE 01/14/2011 1149   HGBUR NEGATIVE 01/14/2011 1149   BILIRUBINUR NEGATIVE 01/14/2011 1149   KETONESUR NEGATIVE 01/14/2011 1149   PROTEINUR NEGATIVE 01/14/2011 1149   UROBILINOGEN 1.0 01/14/2011 1149   NITRITE NEGATIVE 01/14/2011 1149   LEUKOCYTESUR TRACE (A) 01/14/2011 1149   Sepsis Labs: @LABRCNTIP (procalcitonin:4,lacticidven:4) ) Recent Results (from the past 240 hour(s))  Culture, blood (routine x 2)     Status: Abnormal   Collection Time: 07/03/16  9:40 PM  Result Value Ref Range Status   Specimen Description BLOOD RIGHT ARM  Final   Special Requests BOTTLES DRAWN AEROBIC AND ANAEROBIC 5ML  Final   Culture  Setup Time   Final    GRAM POSITIVE  COCCI IN CLUSTERS IN BOTH AEROBIC AND ANAEROBIC BOTTLES CRITICAL RESULT CALLED TO, READ BACK BY AND VERIFIED WITH: T. DANG, PHARMD, 07/04/16 2117 BY J. FUDESCO    Culture (A)  Final    STAPHYLOCOCCUS SPECIES (COAGULASE NEGATIVE) THE SIGNIFICANCE OF ISOLATING THIS ORGANISM FROM A SINGLE SET OF BLOOD CULTURES WHEN MULTIPLE SETS ARE DRAWN IS UNCERTAIN. PLEASE NOTIFY THE MICROBIOLOGY DEPARTMENT WITHIN ONE WEEK IF SPECIATION AND SENSITIVITIES ARE REQUIRED.    Report Status 07/06/2016 FINAL  Final  Blood Culture ID Panel (Reflexed)     Status: Abnormal   Collection Time: 07/03/16  9:40 PM  Result Value Ref Range Status   Enterococcus species NOT DETECTED NOT DETECTED Final   Listeria monocytogenes NOT DETECTED NOT DETECTED Final   Staphylococcus species DETECTED (A) NOT DETECTED Final    Comment: CRITICAL RESULT CALLED TO, READ BACK BY AND VERIFIED WITH: T. DANG PHARMD 07/04/16 2117 BY J FUDESCO    Staphylococcus aureus NOT DETECTED NOT DETECTED Final   Methicillin resistance DETECTED (A) NOT DETECTED Final    Comment: CRITICAL RESULT CALLED TO, READ BACK BY AND VERIFIED WITH: T. DANG PHARMD 07/04/16 2117 BY J. FUDESCO    Streptococcus species NOT DETECTED NOT DETECTED Final   Streptococcus agalactiae NOT DETECTED NOT DETECTED Final   Streptococcus pneumoniae NOT DETECTED NOT DETECTED Final   Streptococcus pyogenes NOT DETECTED NOT DETECTED Final   Acinetobacter baumannii NOT DETECTED NOT DETECTED Final   Enterobacteriaceae species NOT DETECTED NOT DETECTED Final   Enterobacter cloacae complex NOT DETECTED NOT DETECTED Final   Escherichia coli NOT DETECTED NOT DETECTED Final   Klebsiella oxytoca NOT DETECTED NOT DETECTED Final   Klebsiella pneumoniae NOT DETECTED NOT DETECTED Final   Proteus species NOT DETECTED NOT DETECTED Final   Serratia marcescens NOT DETECTED NOT DETECTED Final   Haemophilus influenzae NOT DETECTED NOT DETECTED Final   Neisseria meningitidis NOT DETECTED NOT DETECTED  Final  Pseudomonas aeruginosa NOT DETECTED NOT DETECTED Final   Candida albicans NOT DETECTED NOT DETECTED Final   Candida glabrata NOT DETECTED NOT DETECTED Final   Candida krusei NOT DETECTED NOT DETECTED Final   Candida parapsilosis NOT DETECTED NOT DETECTED Final   Candida tropicalis NOT DETECTED NOT DETECTED Final  Culture, blood (routine x 2)     Status: None   Collection Time: 07/03/16  9:46 PM  Result Value Ref Range Status   Specimen Description BLOOD LEFT ARM  Final   Special Requests BOTTLES DRAWN AEROBIC AND ANAEROBIC 5ML  Final   Culture NO GROWTH 5 DAYS  Final   Report Status 07/09/2016 FINAL  Final  Culture, body fluid-bottle     Status: None   Collection Time: 07/04/16 10:34 AM  Result Value Ref Range Status   Specimen Description FLUID PLEURAL RIGHT  Final   Special Requests NONE  Final   Culture NO GROWTH 5 DAYS  Final   Report Status 07/09/2016 FINAL  Final  Gram stain     Status: None   Collection Time: 07/04/16 10:34 AM  Result Value Ref Range Status   Specimen Description FLUID PLEURAL RIGHT  Final   Special Requests NONE  Final   Gram Stain   Final    RARE WBC PRESENT, PREDOMINANTLY MONONUCLEAR NO ORGANISMS SEEN    Report Status 07/04/2016 FINAL  Final  Blood Cultures x 2 sites     Status: None   Collection Time: 07/05/16  3:34 PM  Result Value Ref Range Status   Specimen Description BLOOD RIGHT ARM  Final   Special Requests BOTTLES DRAWN AEROBIC AND ANAEROBIC 4CC  Final   Culture NO GROWTH 5 DAYS  Final   Report Status 07/10/2016 FINAL  Final  Blood Cultures x 2 sites     Status: None   Collection Time: 07/05/16  3:44 PM  Result Value Ref Range Status   Specimen Description BLOOD LEFT ARM  Final   Special Requests BOTTLES DRAWN AEROBIC AND ANAEROBIC 5CC  Final   Culture NO GROWTH 5 DAYS  Final   Report Status 07/10/2016 FINAL  Final         Radiology Studies: No results found.    Scheduled Meds: .  ceFAZolin (ANCEF) IV  2 g Intravenous  Q8H  . collagenase   Topical Daily  . donepezil  10 mg Oral QHS  . ezetimibe-simvastatin  1 tablet Oral QHS  . feeding supplement (ENSURE ENLIVE)  237 mL Oral Q24H  . feeding supplement (PRO-STAT SUGAR FREE 64)  30 mL Oral BID  . fluconazole  100 mg Oral Daily  . furosemide  40 mg Intravenous Q8H  . insulin aspart  12 Units Subcutaneous TID WC  . insulin glargine  12 Units Subcutaneous QHS  . lisinopril  10 mg Oral Daily  . multivitamin with minerals  1 tablet Oral Daily  . nystatin   Topical TID  . sodium chloride flush  3 mL Intravenous Q12H  . spironolactone  25 mg Oral BID   Continuous Infusions: . heparin 1,700 Units/hr (07/12/16 1027)     LOS: 9 days    Time spent in minutes: 42    [place], MD [facility] Pager: [email or web address] Password [facility] 07/12/2016, 10:44 AM

## 2016-07-13 LAB — CBC
HEMATOCRIT: 35.6 % — AB (ref 39.0–52.0)
Hemoglobin: 11.6 g/dL — ABNORMAL LOW (ref 13.0–17.0)
MCH: 28.6 pg (ref 26.0–34.0)
MCHC: 32.6 g/dL (ref 30.0–36.0)
MCV: 87.7 fL (ref 78.0–100.0)
Platelets: 288 10*3/uL (ref 150–400)
RBC: 4.06 MIL/uL — ABNORMAL LOW (ref 4.22–5.81)
RDW: 15.3 % (ref 11.5–15.5)
WBC: 14.3 10*3/uL — ABNORMAL HIGH (ref 4.0–10.5)

## 2016-07-13 LAB — HEPARIN LEVEL (UNFRACTIONATED): Heparin Unfractionated: 0.63 IU/mL (ref 0.30–0.70)

## 2016-07-13 LAB — GLUCOSE, CAPILLARY
GLUCOSE-CAPILLARY: 186 mg/dL — AB (ref 65–99)
GLUCOSE-CAPILLARY: 227 mg/dL — AB (ref 65–99)
GLUCOSE-CAPILLARY: 163 mg/dL — AB (ref 65–99)
Glucose-Capillary: 154 mg/dL — ABNORMAL HIGH (ref 65–99)

## 2016-07-13 LAB — PROTIME-INR
INR: 1.23
Prothrombin Time: 15.5 seconds — ABNORMAL HIGH (ref 11.4–15.2)

## 2016-07-13 MED ORDER — INSULIN ASPART 100 UNIT/ML ~~LOC~~ SOLN
0.0000 [IU] | Freq: Three times a day (TID) | SUBCUTANEOUS | Status: DC
  Administered 2016-07-13: 2 [IU] via SUBCUTANEOUS
  Administered 2016-07-13 – 2016-07-14 (×3): 3 [IU] via SUBCUTANEOUS
  Administered 2016-07-14: 1 [IU] via SUBCUTANEOUS
  Administered 2016-07-15: 2 [IU] via SUBCUTANEOUS
  Administered 2016-07-15: 1 [IU] via SUBCUTANEOUS
  Administered 2016-07-15: 2 [IU] via SUBCUTANEOUS
  Administered 2016-07-17: 1 [IU] via SUBCUTANEOUS
  Administered 2016-07-17 – 2016-07-18 (×2): 2 [IU] via SUBCUTANEOUS
  Administered 2016-07-18 – 2016-07-19 (×3): 1 [IU] via SUBCUTANEOUS
  Administered 2016-07-19: 2 [IU] via SUBCUTANEOUS
  Administered 2016-07-19: 1 [IU] via SUBCUTANEOUS
  Administered 2016-07-20 (×3): 2 [IU] via SUBCUTANEOUS
  Administered 2016-07-21: 1 [IU] via SUBCUTANEOUS
  Administered 2016-07-21: 2 [IU] via SUBCUTANEOUS
  Administered 2016-07-21: 1 [IU] via SUBCUTANEOUS

## 2016-07-13 MED ORDER — FUROSEMIDE 10 MG/ML IJ SOLN
40.0000 mg | Freq: Two times a day (BID) | INTRAMUSCULAR | Status: DC
  Administered 2016-07-13: 40 mg via INTRAVENOUS
  Filled 2016-07-13: qty 4

## 2016-07-13 NOTE — Consult Note (Signed)
Spoke with pt wife regarding removal of metatarsal head left first toe.  This was scheduled for Dr Bridgett Larsson tomorrow but she would like Dr Trula Slade to give a 2nd opinion.  Will cancel operation for tomorrow.  Dr Trula Slade to see tomorrow morning and decide on plan  Ruta Hinds, MD [facility] Office: [phone] Pager: [phone]

## 2016-07-13 NOTE — Progress Notes (Signed)
[facility] PROGRESS NOTE  [name]. [id number] DOB: 09/29/1934 DOA: 07/03/2016 PCP: [name], MD  Assessment/Plan: 82 y.o.malewith PMH of Dementia, poorly controlled DM, severe AS status post redo aVR (bioprosthetic valve), chronic diastolic heart failure, CAD status post CABG, chronic valvular atrial fibrillation on Coumadin, history of left atrial thrombus noted on TEE in 7/12, history of peripheral vascular disease s/p PTCA of R tibial artery and Left SFA stent placement,  amputation of L great toe and subsequent left foot ulcer with osteomyelitis on Keflex was referred from the infectious disease MDs office for evaluation of hypoxia. Found to have acute on chronic diastolic heart failure and started on diuresis. Infectious disease and vascular surgery is following for chronic osteomyelitis, planned for left first MT amputation on Monday   Acute diastolic CHF (congestive heart failure), acute hypoxic resp failure. Echo. LVEF 55-60%, increased LV pressure, severe pulmonary HTN at 83 mm Hg.  -initially, responded to diuresis with lasix, transitioned to Lasix to oral home dose of 80 in AM and 40 in PM but on 1/17, exam consistent with recurrence of effusion, CXR confirmed recurrence of moderate sized R effusion although his weight had only gone up by 1 kg -restarted IV Lasix TID, will change to BI due to soft BP. Cont monitor renal function, I and O, daily weights. Hold lisinopril periop   Right Pleural effusion. as above. Underwent thoracentesis 1/12 with removal of 1.8 L yellow fluid - per Light's criteria, fluid is transudative - no further work up needed. Needs repeat imaging in few days   Diabetic 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 with PVD. Has been on Keflex at home but wound continues to be infected which is why I have re-consulted ID and Vascular surgery. Underwent Angiogram: has single vessel run off to left foot - vascular surgery plans for amputation next week  monday. wound care consulted  Candida dermatitis of groin. Fluconazole started by ID- added Nystatin powder  Chronic atrial fibrillation. CHA2DS2-VASc Score at least 6. not on rate controlling agent- holding Coumain for procedures - Heparin started  H/o atrial thrombus. on Coumadin- switched to Heparin for bridging S/P AVR (aortic valve replacement). bioprosthetic CAD s/p CABG. not on ASA- cont statin. PACEMAKER, PERMANENT/   Complete heart block  Malnutrition of moderate degree. supplements ordered DM. Will hold mealtime insulin due to episode of low glucose. monitor on lantus, add ISS. ha1c-7.4  Dementia. very poor short term memory- no behavioral disturbances   Code Status: full Family Communication: d/w patient, rn (indicate person spoken with, relationship, and if by phone, the number) Disposition Plan: obtain pt post op    Consultants:  Vascular surgery  infectious disease   Procedures:   Thoracentesis  Antimicrobials:            Anti-infectives    Start     Dose/Rate Route Frequency Ordered Stop   07/14/16 0600  ceFAZolin (ANCEF) IVPB 1 g/50 mL premix  Status:  Discontinued    Comments:  Send with pt to OR   1 g 100 mL/hr over 30 Minutes Intravenous To Short Stay 07/11/16 0820 07/11/16 1139   07/11/16 1200  ceFAZolin (ANCEF) IVPB 2g/100 mL premix     2 g 200 mL/hr over 30 Minutes Intravenous Every 8 hours 07/11/16 1138     07/05/16 1330  fluconazole (DIFLUCAN) tablet 100 mg     100 mg Oral Daily 07/05/16 1231     07/04/16 2200  vancomycin (VANCOCIN) 1,750 mg in sodium chloride 0.9 %  500 mL IVPB  Status:  Discontinued     1,750 mg 250 mL/hr over 120 Minutes Intravenous Every 24 hours 07/04/16 2132 07/05/16 1231   07/03/16 1930  cephALEXin (KEFLEX) capsule 500 mg  Status:  Discontinued             HPI/Subjective: Alert, oriented to person, place. No acute pains.   Objective: Vitals:   07/12/16 2234 07/13/16 0548  BP: (!) 115/58 (!) 102/56   Pulse: 60 61  Resp:  19  Temp:  97.9 F (36.6 C)    Intake/Output Summary (Last 24 hours) at 07/13/16 0746 Last data filed at 07/13/16 0616  Gross per 24 hour  Intake             1137 ml  Output             1452 ml  Net             -315 ml   Filed Weights   07/11/16 0510 07/12/16 0732 07/13/16 0548  Weight: 86.7 kg (191 lb 3.2 oz) 86.9 kg (191 lb 8 oz) 87.7 kg (193 lb 4.8 oz)    Exam:   General:  No distress  Cardiovascular: s1,s2 irregular   Respiratory: few crackles RLL  Abdomen: soft, nt, nd   Musculoskeletal: pedal edema   Data Reviewed: Basic Metabolic Panel:  Recent Labs Lab 07/08/16 0304 07/09/16 0411 07/10/16 0420 07/11/16 0549 07/12/16 0655  NA 135 135 135 135 134*  K 4.9 4.2 4.5 4.1 4.2  CL 93* 95* 94* 94* 91*  CO2 32 29 32 35* 33*  GLUCOSE 121* 136* 114* 79 161*  BUN 50* 41* 37* 33* 37*  CREATININE 1.28* 1.21 1.09 1.13 1.29*  CALCIUM 9.7 9.9 10.0 10.2 10.3   Liver Function Tests: No results for input(s): AST, ALT, ALKPHOS, BILITOT, PROT, ALBUMIN in the last 168 hours. No results for input(s): LIPASE, AMYLASE in the last 168 hours. No results for input(s): AMMONIA in the last 168 hours. CBC:  Recent Labs Lab 07/09/16 0411 07/10/16 0420 07/11/16 0549 07/12/16 0654 07/13/16 0352  WBC 10.7* 10.9* 10.8* 11.4* 14.3*  HGB 11.7* 11.8* 11.7* 12.0* 11.6*  HCT 36.3* 37.5* 37.0* 38.1* 35.6*  MCV 87.7 88.9 87.9 88.2 87.7  PLT 211 227 229 235 288   Cardiac Enzymes: No results for input(s): CKTOTAL, CKMB, CKMBINDEX, TROPONINI in the last 168 hours. BNP (last 3 results)  Recent Labs  07/03/16 1540  BNP 178.7*    ProBNP (last 3 results) No results for input(s): PROBNP in the last 8760 hours.  CBG:  Recent Labs Lab 07/12/16 0636 07/12/16 1134 07/12/16 1618 07/12/16 2139 07/13/16 0545  GLUCAP 166* 103* 107* 112* 154*    Recent Results (from the past 240 hour(s))  Culture, blood (routine x 2)     Status: Abnormal   Collection  Time: 07/03/16  9:40 PM  Result Value Ref Range Status   Specimen Description BLOOD RIGHT ARM  Final   Special Requests BOTTLES DRAWN AEROBIC AND ANAEROBIC 5ML  Final   Culture  Setup Time   Final    GRAM POSITIVE COCCI IN CLUSTERS IN BOTH AEROBIC AND ANAEROBIC BOTTLES CRITICAL RESULT CALLED TO, READ BACK BY AND VERIFIED WITH: T. DANG, PHARMD, 07/04/16 2117 BY J. FUDESCO    Culture (A)  Final    STAPHYLOCOCCUS SPECIES (COAGULASE NEGATIVE) THE SIGNIFICANCE OF ISOLATING THIS ORGANISM FROM A SINGLE SET OF BLOOD CULTURES WHEN MULTIPLE SETS ARE DRAWN IS UNCERTAIN. PLEASE NOTIFY THE MICROBIOLOGY  DEPARTMENT WITHIN ONE WEEK IF SPECIATION AND SENSITIVITIES ARE REQUIRED.    Report Status 07/06/2016 FINAL  Final  Blood Culture ID Panel (Reflexed)     Status: Abnormal   Collection Time: 07/03/16  9:40 PM  Result Value Ref Range Status   Enterococcus species NOT DETECTED NOT DETECTED Final   Listeria monocytogenes NOT DETECTED NOT DETECTED Final   Staphylococcus species DETECTED (A) NOT DETECTED Final    Comment: CRITICAL RESULT CALLED TO, READ BACK BY AND VERIFIED WITH: T. DANG PHARMD 07/04/16 2117 BY J FUDESCO    Staphylococcus aureus NOT DETECTED NOT DETECTED Final   Methicillin resistance DETECTED (A) NOT DETECTED Final    Comment: CRITICAL RESULT CALLED TO, READ BACK BY AND VERIFIED WITH: T. DANG PHARMD 07/04/16 2117 BY J. FUDESCO    Streptococcus species NOT DETECTED NOT DETECTED Final   Streptococcus agalactiae NOT DETECTED NOT DETECTED Final   Streptococcus pneumoniae NOT DETECTED NOT DETECTED Final   Streptococcus pyogenes NOT DETECTED NOT DETECTED Final   Acinetobacter baumannii NOT DETECTED NOT DETECTED Final   Enterobacteriaceae species NOT DETECTED NOT DETECTED Final   Enterobacter cloacae complex NOT DETECTED NOT DETECTED Final   Escherichia coli NOT DETECTED NOT DETECTED Final   Klebsiella oxytoca NOT DETECTED NOT DETECTED Final   Klebsiella pneumoniae NOT DETECTED NOT DETECTED  Final   Proteus species NOT DETECTED NOT DETECTED Final   Serratia marcescens NOT DETECTED NOT DETECTED Final   Haemophilus influenzae NOT DETECTED NOT DETECTED Final   Neisseria meningitidis NOT DETECTED NOT DETECTED Final   Pseudomonas aeruginosa NOT DETECTED NOT DETECTED Final   Candida albicans NOT DETECTED NOT DETECTED Final   Candida glabrata NOT DETECTED NOT DETECTED Final   Candida krusei NOT DETECTED NOT DETECTED Final   Candida parapsilosis NOT DETECTED NOT DETECTED Final   Candida tropicalis NOT DETECTED NOT DETECTED Final  Culture, blood (routine x 2)     Status: None   Collection Time: 07/03/16  9:46 PM  Result Value Ref Range Status   Specimen Description BLOOD LEFT ARM  Final   Special Requests BOTTLES DRAWN AEROBIC AND ANAEROBIC 5ML  Final   Culture NO GROWTH 5 DAYS  Final   Report Status 07/09/2016 FINAL  Final  Culture, body fluid-bottle     Status: None   Collection Time: 07/04/16 10:34 AM  Result Value Ref Range Status   Specimen Description FLUID PLEURAL RIGHT  Final   Special Requests NONE  Final   Culture NO GROWTH 5 DAYS  Final   Report Status 07/09/2016 FINAL  Final  Gram stain     Status: None   Collection Time: 07/04/16 10:34 AM  Result Value Ref Range Status   Specimen Description FLUID PLEURAL RIGHT  Final   Special Requests NONE  Final   Gram Stain   Final    RARE WBC PRESENT, PREDOMINANTLY MONONUCLEAR NO ORGANISMS SEEN    Report Status 07/04/2016 FINAL  Final  Blood Cultures x 2 sites     Status: None   Collection Time: 07/05/16  3:34 PM  Result Value Ref Range Status   Specimen Description BLOOD RIGHT ARM  Final   Special Requests BOTTLES DRAWN AEROBIC AND ANAEROBIC 4CC  Final   Culture NO GROWTH 5 DAYS  Final   Report Status 07/10/2016 FINAL  Final  Blood Cultures x 2 sites     Status: None   Collection Time: 07/05/16  3:44 PM  Result Value Ref Range Status   Specimen Description BLOOD LEFT ARM  Final   Special Requests BOTTLES DRAWN  AEROBIC AND ANAEROBIC 5CC  Final   Culture NO GROWTH 5 DAYS  Final   Report Status 07/10/2016 FINAL  Final     Studies: Dg Chest Port 1 View  Result Date: 07/12/2016 CLINICAL DATA:  Congestive heart failure and hypoxia up. Pleural effusions. EXAM: PORTABLE CHEST 1 VIEW COMPARISON:  07/09/2016 FINDINGS: Previous median sternotomy and valve replacement. Single lead pacemaker appears unchanged. There is pulmonary venous hypertension with mild edema. This has probably improved slightly over the last 3 days. There is a moderate size right effusion with volume loss in the right lower lung. No pleural fluid seen on the left in this projection. IMPRESSION: Persistent congestive heart failure, but with slight improvement over the study 3 days ago. Persistent moderate size right effusion with volume loss in the right lower lung. Electronically Signed   By: Nelson Chimes M.D.   On: 07/12/2016 11:36    Scheduled Meds: .  ceFAZolin (ANCEF) IV  2 g Intravenous Q8H  . collagenase   Topical Daily  . donepezil  10 mg Oral QHS  . ezetimibe-simvastatin  1 tablet Oral QHS  . feeding supplement (ENSURE ENLIVE)  237 mL Oral Q24H  . feeding supplement (PRO-STAT SUGAR FREE 64)  30 mL Oral BID  . fluconazole  100 mg Oral Daily  . furosemide  40 mg Intravenous Q8H  . insulin aspart  12 Units Subcutaneous TID WC  . insulin glargine  12 Units Subcutaneous QHS  . lisinopril  10 mg Oral Daily  . multivitamin with minerals  1 tablet Oral Daily  . nystatin   Topical TID  . sodium chloride flush  3 mL Intravenous Q12H  . spironolactone  25 mg Oral BID   Continuous Infusions: . heparin 1,700 Units/hr (07/13/16 0545)    Principal Problem:   Diabetic foot ulcer with osteomyelitis ([place]) Active Problems:   Obstructive sleep apnea   S/P AVR (aortic valve replacement)   PACEMAKER, PERMANENT   Aortic valve stenosis, severe prosthetic   Chronic atrial fibrillation ([name])   Complete heart block ([name])   Hypoxia   Acute  diastolic CHF (congestive heart failure) ([name])   Malnutrition of moderate degree   Pleural effusion   Congestive heart failure ([name])   S/P thoracentesis   Wound abscess    Time spent: >35 minutes     Kinnie Feil  [facility] Pager [phone]. If 7PM-7AM, please contact night-coverage at [email or web address], password [email or web address] 07/13/2016, 7:46 AM  LOS: 10 days

## 2016-07-13 NOTE — Progress Notes (Signed)
ANTICOAGULATION CONSULT NOTE - FOLLOW UP  Pharmacy Consult:  Heparin Indication:  History of Afib  Allergies  Allergen Reactions  . Potassium-Containing Compounds Anaphylaxis    IV--loss of conciousness  . Metoprolol Succinate Other (See Comments)    Extremely tired    Patient Measurements: Height: 5\' 10"  (177.8 cm) Weight: 193 lb 4.8 oz (87.7 kg) (Scale B) IBW/kg (Calculated) : 73 Heparin Dosing Weight: 88 kg  Vital Signs: Temp: 97.9 F (36.6 C) (01/21 0548) Temp Source: Oral (01/21 0548) BP: 102/56 (01/21 0548) Pulse Rate: 61 (01/21 0548)  Labs:  Recent Labs  07/11/16 0549  07/12/16 0654 07/12/16 0655 07/12/16 2030 07/13/16 0352  HGB 11.7*  --  12.0*  --   --  11.6*  HCT 37.0*  --  38.1*  --   --  35.6*  PLT 229  --  235  --   --  288  LABPROT 16.4*  --  16.6*  --   --  15.5*  INR 1.31  --  1.33  --   --  1.23  HEPARINUNFRC 0.25*  < > 0.32  --  0.55 0.63  CREATININE 1.13  --   --  1.29*  --   --   < > = values in this interval not displayed.  Estimated Creatinine Clearance: 51.8 mL/min (by C-G formula based on SCr of 1.29 mg/dL (H)).    Assessment: 89 YOM on Coumadin PTA for history of Afib.  He also has a history of bioprosthetic AVR and LAA thrombus.  Coumadin has been on hold since admission and bridged with IV heparin since 07/07/16.  He is now s/p angiogram on 07/10/16 and the current plan is for amputation next week  Daily HL remains therapeutic at 0.63. CBC stable. No bleeding noted.   Goal of Therapy:  Heparin level 0.3-0.7 units/ml Monitor platelets by anticoagulation protocol: Yes    Plan:  - Continue heparin infusion at 1700 units/hr - Daily heparin level and CBC - monitor s/sx of bleeding  Dierdre Harness, [place], PharmD Clinical Pharmacy Resident [phone] (Pager) 07/13/2016 9:30 AM

## 2016-07-13 NOTE — Progress Notes (Signed)
Patient with no complaints or concerns during 7pm - 7am shift.  Arlee Santosuosso, RN 

## 2016-07-14 ENCOUNTER — Encounter (HOSPITAL_COMMUNITY)
Admission: EM | Disposition: A | Discharge status: Skilled Nursing Facility | Payer: Self-pay | Source: Home / Self Care | Attending: Internal Medicine

## 2016-07-14 ENCOUNTER — Ambulatory Visit: Payer: Medicare Other | Admitting: Surgery

## 2016-07-14 ENCOUNTER — Inpatient Hospital Stay (HOSPITAL_COMMUNITY): Payer: Medicare Other

## 2016-07-14 DIAGNOSIS — I998 Other disorder of circulatory system: Secondary | ICD-10-CM

## 2016-07-14 LAB — GLUCOSE, CAPILLARY
GLUCOSE-CAPILLARY: 203 mg/dL — AB (ref 65–99)
GLUCOSE-CAPILLARY: 250 mg/dL — AB (ref 65–99)
GLUCOSE-CAPILLARY: 172 mg/dL — AB (ref 65–99)
Glucose-Capillary: 148 mg/dL — ABNORMAL HIGH (ref 65–99)

## 2016-07-14 LAB — BASIC METABOLIC PANEL
Anion gap: 7 (ref 5–15)
BUN: 40 mg/dL — AB (ref 6–20)
CHLORIDE: 92 mmol/L — AB (ref 101–111)
CO2: 34 mmol/L — AB (ref 22–32)
CREATININE: 1.52 mg/dL — AB (ref 0.61–1.24)
Calcium: 10.3 mg/dL (ref 8.9–10.3)
GFR calc Af Amer: 48 mL/min — ABNORMAL LOW (ref >60)
GFR calc non Af Amer: 42 mL/min — ABNORMAL LOW (ref >60)
GLUCOSE: 144 mg/dL — AB (ref 65–99)
POTASSIUM: 4.4 mmol/L (ref 3.5–5.1)
SODIUM: 133 mmol/L — AB (ref 135–145)

## 2016-07-14 LAB — CBC
HEMATOCRIT: 37.9 % — AB (ref 39.0–52.0)
HEMOGLOBIN: 12.2 g/dL — AB (ref 13.0–17.0)
MCH: 28.4 pg (ref 26.0–34.0)
MCHC: 32.2 g/dL (ref 30.0–36.0)
MCV: 88.3 fL (ref 78.0–100.0)
Platelets: 254 10*3/uL (ref 150–400)
RBC: 4.29 MIL/uL (ref 4.22–5.81)
RDW: 15.4 % (ref 11.5–15.5)
WBC: 11.9 10*3/uL — ABNORMAL HIGH (ref 4.0–10.5)

## 2016-07-14 LAB — PROTIME-INR
INR: 1.19
PROTHROMBIN TIME: 15.1 s (ref 11.4–15.2)

## 2016-07-14 LAB — HEPARIN LEVEL (UNFRACTIONATED): Heparin Unfractionated: 0.57 IU/mL (ref 0.30–0.70)

## 2016-07-14 SURGERY — AMPUTATION, FOOT, TRANSMETATARSAL
Anesthesia: Choice | Laterality: Left

## 2016-07-14 MED ORDER — PREMIER PROTEIN SHAKE
11.0000 [oz_av] | ORAL | Status: DC
  Administered 2016-07-15 – 2016-07-21 (×3): 11 [oz_av] via ORAL
  Filled 2016-07-14 (×8): qty 325.31

## 2016-07-14 NOTE — Progress Notes (Signed)
Notified by CCMD that patient had 9 beats of VT. Informed on call doctor, Enriqueta Shutter. Pt asymptomatic, asleep. No new orders at this time.  Will continue to monitor.   Alyana Kreiter, RN

## 2016-07-14 NOTE — Progress Notes (Signed)
ANTICOAGULATION CONSULT NOTE  Pharmacy Consult:  Heparin Indication:  History of Afib  Allergies  Allergen Reactions  . Potassium-Containing Compounds Anaphylaxis    IV--loss of conciousness  . Metoprolol Succinate Other (See Comments)    Extremely tired    Patient Measurements: Height: 5\' 10"  (177.8 cm) Weight: 192 lb (87.1 kg) IBW/kg (Calculated) : 73 Heparin Dosing Weight: 88 kg  Vital Signs: Temp: 97.4 F (36.3 C) (01/22 0647) Temp Source: Oral (01/22 0647) BP: 113/55 (01/22 0647) Pulse Rate: 60 (01/22 0647)  Labs:  Recent Labs  07/12/16 0654 07/12/16 0655 07/12/16 2030 07/13/16 0352 07/14/16 0450  HGB 12.0*  --   --  11.6* 12.2*  HCT 38.1*  --   --  35.6* 37.9*  PLT 235  --   --  288 254  LABPROT 16.6*  --   --  15.5* 15.1  INR 1.33  --   --  1.23 1.19  HEPARINUNFRC 0.32  --  0.55 0.63 0.57  CREATININE  --  1.29*  --   --  1.52*    Estimated Creatinine Clearance: 40.7 mL/min (by C-G formula based on SCr of 1.52 mg/dL (H)).    Assessment: 71 YOM on Coumadin PTA for history of Afib.  He also has a history of bioprosthetic AVR and LAA thrombus.  Coumadin has been on hold since admission and bridged with IV heparin since 07/07/16.  He is now s/p angiogram on 07/10/16 and the current plan is for amputation next week.  Daily HL remains therapeutic at 0.57. CBC stable. No bleeding noted.   Goal of Therapy:  Heparin level 0.3-0.7 units/ml Monitor platelets by anticoagulation protocol: Yes   Plan:  Continue heparin 1700 units/hr Daily heparin level and CBC Monitor s/sx of bleeding F/u vascular surgery plan for amputation  Andrey Cota. Diona Foley, PharmD, BCPS Clinical Pharmacist Pager [phone] 07/14/2016 8:15 AM

## 2016-07-14 NOTE — Progress Notes (Signed)
Pt is refusing to measure urine output. Pt is ambulating independently to restroom, despite multiple requests for pt to use urinal, at bed.

## 2016-07-14 NOTE — Progress Notes (Signed)
Patient with no complaints or concerns during 7pm - 7am shift.  Zaylyn Bergdoll, RN 

## 2016-07-14 NOTE — Progress Notes (Signed)
    Subjective  -   Resting comfortably   Physical Exam:  Ischemic changes to left great toe with drainage       Assessment/Plan:    I have reviewed his angiogram from last week and discussed it with the patient and his wife.  Prior to resection of the metatarsal head, I would like to attempt opening his left ATA.  This will increase the chances of salvaging the foot.  We will plan for angiogram in the OR and metatarsal head resection on Wednesday.  Annamarie Major 07/14/2016 6:34 PM --  Vitals:   07/14/16 0647 07/14/16 1136  BP: (!) 113/55 (!) 122/45  Pulse: 60 (!) 59  Resp: 19 18  Temp: 97.4 F (36.3 C) 98.3 F (36.8 C)    Intake/Output Summary (Last 24 hours) at 07/14/16 1834 Last data filed at 07/14/16 1300  Gross per 24 hour  Intake              977 ml  Output              600 ml  Net              377 ml     Laboratory CBC    Component Value Date/Time   WBC 11.9 (H) 07/14/2016 0450   HGB 12.2 (L) 07/14/2016 0450   HGB 14.1 11/22/2010 0953   HCT 37.9 (L) 07/14/2016 0450   HCT 42.6 11/22/2010 0953   PLT 254 07/14/2016 0450   PLT 223 11/22/2010 0953    BMET    Component Value Date/Time   NA 133 (L) 07/14/2016 0450   K 4.4 07/14/2016 0450   CL 92 (L) 07/14/2016 0450   CO2 34 (H) 07/14/2016 0450   GLUCOSE 144 (H) 07/14/2016 0450   GLUCOSE 229 08/07/2008 0000   BUN 40 (H) 07/14/2016 0450   CREATININE 1.52 (H) 07/14/2016 0450   CREATININE 1.17 03/19/2016 1534   CALCIUM 10.3 07/14/2016 0450   GFRNONAA 42 (L) 07/14/2016 0450   GFRAA 48 (L) 07/14/2016 0450    COAG Lab Results  Component Value Date   INR 1.19 07/14/2016   INR 1.23 07/13/2016   INR 1.33 07/12/2016   PROTIME 16.2 12/05/2008   No results found for: PTT  Antibiotics Anti-infectives    Start     Dose/Rate Route Frequency Ordered Stop   07/14/16 0600  ceFAZolin (ANCEF) IVPB 1 g/50 mL premix  Status:  Discontinued    Comments:  Send with pt to OR   1 g 100 mL/hr over 30  Minutes Intravenous To Short Stay 07/11/16 0820 07/11/16 1139   07/11/16 1200  ceFAZolin (ANCEF) IVPB 2g/100 mL premix     2 g 200 mL/hr over 30 Minutes Intravenous Every 8 hours 07/11/16 1138     07/05/16 1330  fluconazole (DIFLUCAN) tablet 100 mg     100 mg Oral Daily 07/05/16 1231     07/04/16 2200  vancomycin (VANCOCIN) 1,750 mg in sodium chloride 0.9 % 500 mL IVPB  Status:  Discontinued     1,750 mg 250 mL/hr over 120 Minutes Intravenous Every 24 hours 07/04/16 2132 07/05/16 1231   07/03/16 1930  cephALEXin (KEFLEX) capsule 500 mg  Status:  Discontinued     500 mg Oral Every 8 hours 07/03/16 1835 07/05/16 1458       V. Leia Alf, M.D. [facility] [place] [facility] [place] Office: [phone] Pager:  [phone]

## 2016-07-14 NOTE — Progress Notes (Signed)
Nutrition Follow-up  DOCUMENTATION CODES:   Non-severe (moderate) malnutrition in context of chronic illness  INTERVENTION:  Discontinue 30 ml Pro-stat BID, each dose provides 15 grams of protein and 100 kcal Continue Ensure Enlive po once daily, each supplement provides 350 kcal and 20 grams of protein Provide Premier Protein Shake po once daily, each supplement provides 160 kcal and 30 grams of protein Multivitamin with minerals daily  NUTRITION DIAGNOSIS:   Malnutrition related to chronic illness as evidenced by mild depletion of body fat, moderate depletions of muscle mass.  ongoing  GOAL:   Patient will meet greater than or equal to 90% of their needs  Being met  MONITOR:   PO intake, Labs, Skin, Weight trends, Supplement acceptance  REASON FOR ASSESSMENT:   Consult Wound healing  ASSESSMENT:   82 y.o. male with medical history significant of severe AS status post redo aVR (bioprosthetic valve), chronic diastolic heart failure, CAD status post CABG, chronic atrial fibrillation on Coumadin, history of left atrial thrombus noted on TEE in 7/12, history of peripheral vascular disease, left foot ulcer with osteomyelitis on Keflex was referred from the infectious disease MDs office for evaluation of hypoxia. Chest x-ray in the emergency room showed right-sided pleural effusion, and underlying infiltrate/atelectasis.   Per nursing notes, pt has been accepting Ensure Enlive daily, but refusing Pro-Stat supplement often the past 3 days. RN reports that pt as been eating well- ate most of breakfast this morning. Pt reports having a good appetite and denies any abdominal pain. Pt's weight has been fairly stable since admission.   Labs: low sodium, low chloride, elevated BUN, glucose ranging 103 to 227 mg/dL  Diet Order:  Diet heart healthy/carb modified Room service appropriate? Yes; Fluid consistency: Thin  Skin:  Wound (see comment) (ulcer on L amputated toe)  Last BM:   1/21  Height:   Ht Readings from Last 1 Encounters:  07/03/16 _0  (1.778 m)    Weight:   Wt Readings from Last 1 Encounters:  07/14/16 192 lb (87.1 kg)    Ideal Body Weight:  75.45 kg  BMI:  Body mass index is 27.55 kg/m.  Estimated Nutritional Needs:   Kcal:  1900-2100  Protein:  105-115 grams  Fluid:  2.1 L/day  EDUCATION NEEDS:   No education needs identified at this time  [place], CSP, LDN Inpatient Clinical Dietitian Pager: [phone] After Hours Pager: [phone]

## 2016-07-14 NOTE — Progress Notes (Signed)
Physical Therapy Treatment Patient Details Name: [name]. MRN: [id number] DOB: 1934-11-23 Today's Date: 2016-07-31    History of Present Illness Pt adm with Acute hypoxemic respiratory failure: Probably secondary to acute diastolic heart failure or pleural effusion. Pt also with osteomyelitis of lt foot and now debating possible surgery. PMH - chf, avr, cabg, afib, pacer, lt toe amputations, pvd.    PT Comments    Pt continues to show improvements with gait stability. Pt ready for dc home from PT standpoint. If opts for surgery on lt foot will need continued PT.  Follow Up Recommendations  Home health PT;Supervision - Intermittent     Equipment Recommendations  None recommended by PT    Recommendations for Other Services       Precautions / Restrictions Precautions Precautions: Fall Restrictions Weight Bearing Restrictions: No    Mobility  Bed Mobility Overal bed mobility: Modified Independent             General bed mobility comments: Incr time  Transfers Overall transfer level: Modified independent Equipment used: None Transfers: Sit to/from Stand Sit to Stand: Modified independent (Device/Increase time)            Ambulation/Gait Ambulation/Gait assistance: Supervision Ambulation Distance (Feet): 240 Feet Assistive device: None (pushed IV pole at times) Gait Pattern/deviations: Decreased stride length;Step-through pattern;Drifts right/left;Wide base of support Gait velocity: decr Gait velocity interpretation: Below normal speed for age/gender General Gait Details: Pt with improving stability of gait.   Stairs            Wheelchair Mobility    Modified Rankin (Stroke Patients Only)       Balance Overall balance assessment: Needs assistance Sitting-balance support: No upper extremity supported;Feet supported Sitting balance-Leahy Scale: Good     Standing balance support: No upper extremity supported Standing balance-Leahy  Scale: Fair                      Cognition Arousal/Alertness: Awake/alert Behavior During Therapy: WFL for tasks assessed/performed Overall Cognitive Status: History of cognitive impairments - at baseline                      Exercises      General Comments        Pertinent Vitals/Pain Pain Assessment: No/denies pain    Home Living                      Prior Function            PT Goals (current goals can now be found in the care plan section) Progress towards PT goals: Goals met and updated - see care plan    Frequency    Min 3X/week      PT Plan Current plan remains appropriate    Co-evaluation             End of Session   Activity Tolerance: Patient tolerated treatment well Patient left: in bed;with call bell/phone within reach (has O2 off when PT entered and left off as he is satting 91%)     Time: 0881-1031 PT Time Calculation (min) (ACUTE ONLY): 8 min  Charges:  $Gait Training: 8-22 mins                    G CodesShary Decamp [facility] 07-31-16, 9:30 AM Suanne Marker PT [phone]

## 2016-07-14 NOTE — Progress Notes (Signed)
[facility] PROGRESS NOTE  [name]. [id number] DOB: 31-Aug-1934 DOA: 07/03/2016 PCP: [name], MD  Assessment/Plan: 82 y.o.malewith PMH of Dementia, poorly controlled DM, severe AS status post redo aVR (bioprosthetic valve), chronic diastolic heart failure, CAD status post CABG, chronic valvular atrial fibrillation on Coumadin, history of left atrial thrombus noted on TEE in 7/12, history of peripheral vascular disease s/p PTCA of R tibial artery and Left SFA stent placement,  amputation of L great toe and subsequent left foot ulcer with osteomyelitis on Keflex was referred from the infectious disease MDs office for evaluation of hypoxia. Found to have acute on chronic diastolic heart failure and started on diuresis. Infectious disease and vascular surgery is following for chronic osteomyelitis, awaiting left first MT amputation per vascular surgery    Acute diastolic CHF (congestive heart failure), acute hypoxic resp failure. Echo. LVEF 55-60%, increased LV pressure, severe pulmonary HTN at 83 mm Hg.  -initially, responded to diuresis with lasix, transitioned to Lasix to oral home dose of 80 in AM and 40 in PM but on 1/17, exam consistent with recurrence of effusion, CXR confirmed recurrence of moderate sized R effusion. Patient was restarted IV Lasix TID. 1/22: will hold lasix due to elevated creatinine, soft BP. Will reevaluate with CXR.  -Cont monitor renal function, I and O, daily weights. Hold lisinopril, spironolactone due to elevated creatinine   Right Pleural effusion. as above. Underwent thoracentesis 1/12 with removal of 1.8 L yellow fluid - per Light's criteria, fluid is transudate.will repeat CXR today, may need repeat thoracentesis.    Diabetic 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 with PVD. Has been on Keflex at home but wound continues to be infected which is why I have re-consulted ID and Vascular surgery. Underwent Angiogram: has single vessel run off to left  foot -awaiting vascular surgery plans for amputation  wound care consulted  Candida dermatitis of groin. Fluconazole started by ID- added Nystatin powder  Chronic atrial fibrillation. CHA2DS2-VASc Score at least 6. not on rate controlling agent- holding Coumain for procedures - Heparin started  H/o atrial thrombus. on Coumadin- switched to Heparin for bridging S/P AVR (aortic valve replacement). bioprosthetic CAD s/p CABG. not on ASA- cont statin. PACEMAKER, PERMANENT/   Complete heart block  Malnutrition of moderate degree. supplements ordered DM. Will hold mealtime insulin due to episode of low glucose. monitor on lantus, add ISS. ha1c-7.4  Dementia. very poor short term memory- no behavioral disturbances   Code Status: full Family Communication: d/w patient, rn (indicate person spoken with, relationship, and if by phone, the number) Disposition Plan: obtain pt post op    Consultants:  Vascular surgery  infectious disease   Procedures:   Thoracentesis  Antimicrobials:            Anti-infectives    Start     Dose/Rate Route Frequency Ordered Stop   07/14/16 0600  ceFAZolin (ANCEF) IVPB 1 g/50 mL premix  Status:  Discontinued    Comments:  Send with pt to OR   1 g 100 mL/hr over 30 Minutes Intravenous To Short Stay 07/11/16 0820 07/11/16 1139   07/11/16 1200  ceFAZolin (ANCEF) IVPB 2g/100 mL premix     2 g 200 mL/hr over 30 Minutes Intravenous Every 8 hours 07/11/16 1138     07/05/16 1330  fluconazole (DIFLUCAN) tablet 100 mg     100 mg Oral Daily 07/05/16 1231     07/04/16 2200  vancomycin (VANCOCIN) 1,750 mg in sodium chloride 0.9 % 500  mL IVPB  Status:  Discontinued     1,750 mg 250 mL/hr over 120 Minutes Intravenous Every 24 hours 07/04/16 2132 07/05/16 1231   07/03/16 1930  cephALEXin (KEFLEX) capsule 500 mg  Status:  Discontinued             HPI/Subjective: Alert, oriented to person, place. No acute pains.   Objective: Vitals:   07/13/16  2150 07/14/16 0647  BP: (!) 105/49 (!) 113/55  Pulse: (!) 58 60  Resp:  19  Temp:  97.4 F (36.3 C)    Intake/Output Summary (Last 24 hours) at 07/14/16 0738 Last data filed at 07/14/16 [id number]  Gross per 24 hour  Intake          1242.47 ml  Output             1200 ml  Net            42.47 ml   Filed Weights   07/12/16 0732 07/13/16 0548 07/14/16 0647  Weight: 86.9 kg (191 lb 8 oz) 87.7 kg (193 lb 4.8 oz) 87.1 kg (192 lb)    Exam:   General:  No distress  Cardiovascular: s1,s2 irregular   Respiratory: few crackles RLL  Abdomen: soft, nt, nd   Musculoskeletal: pedal edema   Data Reviewed: Basic Metabolic Panel:  Recent Labs Lab 07/09/16 0411 07/10/16 0420 07/11/16 0549 07/12/16 0655 07/14/16 0450  NA 135 135 135 134* 133*  K 4.2 4.5 4.1 4.2 4.4  CL 95* 94* 94* 91* 92*  CO2 29 32 35* 33* 34*  GLUCOSE 136* 114* 79 161* 144*  BUN 41* 37* 33* 37* 40*  CREATININE 1.21 1.09 1.13 1.29* 1.52*  CALCIUM 9.9 10.0 10.2 10.3 10.3   Liver Function Tests: No results for input(s): AST, ALT, ALKPHOS, BILITOT, PROT, ALBUMIN in the last 168 hours. No results for input(s): LIPASE, AMYLASE in the last 168 hours. No results for input(s): AMMONIA in the last 168 hours. CBC:  Recent Labs Lab 07/10/16 0420 07/11/16 0549 07/12/16 0654 07/13/16 0352 07/14/16 0450  WBC 10.9* 10.8* 11.4* 14.3* 11.9*  HGB 11.8* 11.7* 12.0* 11.6* 12.2*  HCT 37.5* 37.0* 38.1* 35.6* 37.9*  MCV 88.9 87.9 88.2 87.7 88.3  PLT 227 229 235 288 254   Cardiac Enzymes: No results for input(s): CKTOTAL, CKMB, CKMBINDEX, TROPONINI in the last 168 hours. BNP (last 3 results)  Recent Labs  07/03/16 1540  BNP 178.7*    ProBNP (last 3 results) No results for input(s): PROBNP in the last 8760 hours.  CBG:  Recent Labs Lab 07/13/16 0545 07/13/16 1105 07/13/16 1544 07/13/16 2158 07/14/16 0601  GLUCAP 154* 186* 227* 163* 148*    Recent Results (from the past 240 hour(s))  Culture, body  fluid-bottle     Status: None   Collection Time: 07/04/16 10:34 AM  Result Value Ref Range Status   Specimen Description FLUID PLEURAL RIGHT  Final   Special Requests NONE  Final   Culture NO GROWTH 5 DAYS  Final   Report Status 07/09/2016 FINAL  Final  Gram stain     Status: None   Collection Time: 07/04/16 10:34 AM  Result Value Ref Range Status   Specimen Description FLUID PLEURAL RIGHT  Final   Special Requests NONE  Final   Gram Stain   Final    RARE WBC PRESENT, PREDOMINANTLY MONONUCLEAR NO ORGANISMS SEEN    Report Status 07/04/2016 FINAL  Final  Blood Cultures x 2 sites     Status:  None   Collection Time: 07/05/16  3:34 PM  Result Value Ref Range Status   Specimen Description BLOOD RIGHT ARM  Final   Special Requests BOTTLES DRAWN AEROBIC AND ANAEROBIC 4CC  Final   Culture NO GROWTH 5 DAYS  Final   Report Status 07/10/2016 FINAL  Final  Blood Cultures x 2 sites     Status: None   Collection Time: 07/05/16  3:44 PM  Result Value Ref Range Status   Specimen Description BLOOD LEFT ARM  Final   Special Requests BOTTLES DRAWN AEROBIC AND ANAEROBIC 5CC  Final   Culture NO GROWTH 5 DAYS  Final   Report Status 07/10/2016 FINAL  Final     Studies: Dg Chest Port 1 View  Result Date: 07/12/2016 CLINICAL DATA:  Congestive heart failure and hypoxia up. Pleural effusions. EXAM: PORTABLE CHEST 1 VIEW COMPARISON:  07/09/2016 FINDINGS: Previous median sternotomy and valve replacement. Single lead pacemaker appears unchanged. There is pulmonary venous hypertension with mild edema. This has probably improved slightly over the last 3 days. There is a moderate size right effusion with volume loss in the right lower lung. No pleural fluid seen on the left in this projection. IMPRESSION: Persistent congestive heart failure, but with slight improvement over the study 3 days ago. Persistent moderate size right effusion with volume loss in the right lower lung. Electronically Signed   By: Nelson Chimes M.D.   On: 07/12/2016 11:36    Scheduled Meds: .  ceFAZolin (ANCEF) IV  2 g Intravenous Q8H  . collagenase   Topical Daily  . donepezil  10 mg Oral QHS  . ezetimibe-simvastatin  1 tablet Oral QHS  . feeding supplement (ENSURE ENLIVE)  237 mL Oral Q24H  . feeding supplement (PRO-STAT SUGAR FREE 64)  30 mL Oral BID  . fluconazole  100 mg Oral Daily  . furosemide  40 mg Intravenous BID  . insulin aspart  0-9 Units Subcutaneous TID WC  . insulin glargine  12 Units Subcutaneous QHS  . multivitamin with minerals  1 tablet Oral Daily  . nystatin   Topical TID  . sodium chloride flush  3 mL Intravenous Q12H   Continuous Infusions: . heparin 1,700 Units/hr (07/13/16 2152)    Principal Problem:   Diabetic foot ulcer with osteomyelitis ([place]) Active Problems:   Obstructive sleep apnea   S/P AVR (aortic valve replacement)   PACEMAKER, PERMANENT   Aortic valve stenosis, severe prosthetic   Chronic atrial fibrillation ([name])   Complete heart block ([name])   Hypoxia   Acute diastolic CHF (congestive heart failure) ([name])   Malnutrition of moderate degree   Pleural effusion   Congestive heart failure ([place])   S/P thoracentesis   Wound abscess    Time spent: >35 minutes     Kinnie Feil  [facility] Pager [phone]. If 7PM-7AM, please contact night-coverage at [email or web address], password [facility] 07/14/2016, 7:38 AM  LOS: 11 days

## 2016-07-15 LAB — CBC
HCT: 36.6 % — ABNORMAL LOW (ref 39.0–52.0)
Hemoglobin: 11.8 g/dL — ABNORMAL LOW (ref 13.0–17.0)
MCH: 28.5 pg (ref 26.0–34.0)
MCHC: 32.2 g/dL (ref 30.0–36.0)
MCV: 88.4 fL (ref 78.0–100.0)
PLATELETS: 275 10*3/uL (ref 150–400)
RBC: 4.14 MIL/uL — ABNORMAL LOW (ref 4.22–5.81)
RDW: 15.5 % (ref 11.5–15.5)
WBC: 12.7 10*3/uL — AB (ref 4.0–10.5)

## 2016-07-15 LAB — BASIC METABOLIC PANEL
Anion gap: 8 (ref 5–15)
BUN: 37 mg/dL — AB (ref 6–20)
CALCIUM: 10.5 mg/dL — AB (ref 8.9–10.3)
CO2: 32 mmol/L (ref 22–32)
CREATININE: 1.31 mg/dL — AB (ref 0.61–1.24)
Chloride: 92 mmol/L — ABNORMAL LOW (ref 101–111)
GFR calc Af Amer: 58 mL/min — ABNORMAL LOW (ref >60)
GFR, EST NON AFRICAN AMERICAN: 50 mL/min — AB (ref >60)
GLUCOSE: 139 mg/dL — AB (ref 65–99)
Potassium: 4.7 mmol/L (ref 3.5–5.1)
Sodium: 132 mmol/L — ABNORMAL LOW (ref 135–145)

## 2016-07-15 LAB — GLUCOSE, CAPILLARY
GLUCOSE-CAPILLARY: 146 mg/dL — AB (ref 65–99)
GLUCOSE-CAPILLARY: 168 mg/dL — AB (ref 65–99)
GLUCOSE-CAPILLARY: 151 mg/dL — AB (ref 65–99)
Glucose-Capillary: 157 mg/dL — ABNORMAL HIGH (ref 65–99)

## 2016-07-15 LAB — PROTIME-INR
INR: 1.12
Prothrombin Time: 14.5 seconds (ref 11.4–15.2)

## 2016-07-15 LAB — HEPARIN LEVEL (UNFRACTIONATED): Heparin Unfractionated: 0.57 IU/mL (ref 0.30–0.70)

## 2016-07-15 MED ORDER — FUROSEMIDE 40 MG PO TABS
40.0000 mg | ORAL_TABLET | Freq: Two times a day (BID) | ORAL | Status: DC
  Administered 2016-07-15 – 2016-07-21 (×14): 40 mg via ORAL
  Filled 2016-07-15 (×14): qty 1

## 2016-07-15 NOTE — Progress Notes (Signed)
ANTICOAGULATION CONSULT NOTE  Pharmacy Consult:  Heparin Indication:  History of Afib  Allergies  Allergen Reactions  . Potassium-Containing Compounds Anaphylaxis    IV--loss of conciousness  . Metoprolol Succinate Other (See Comments)    Extremely tired    Patient Measurements: Height: 5\' 10"  (177.8 cm) Weight: 192 lb 8 oz (87.3 kg) (scasle b) IBW/kg (Calculated) : 73 Heparin Dosing Weight: 88 kg  Vital Signs: Temp: 97.5 F (36.4 C) (01/23 0633) Temp Source: Oral (01/23 [id number]) BP: 147/54 (01/23 [id number]) Pulse Rate: 66 (01/23 0633)  Labs:  Recent Labs  07/13/16 0352 07/14/16 0450 07/15/16 0500 07/15/16 0721  HGB 11.6* 12.2* 11.8*  --   HCT 35.6* 37.9* 36.6*  --   PLT 288 254 275  --   LABPROT 15.5* 15.1  --  14.5  INR 1.23 1.19  --  1.12  HEPARINUNFRC 0.63 0.57  --  0.57  CREATININE  --  1.52* 1.31*  --     Estimated Creatinine Clearance: 47.2 mL/min (by C-G formula based on SCr of 1.31 mg/dL (H)).    Assessment: 20 YOM on Coumadin PTA for history of Afib.  He also has a history of bioprosthetic AVR and LAA thrombus.  Coumadin has been on hold since admission and bridged with IV heparin since 07/07/16.  He is now s/p angiogram on 07/10/16 and the current plan is for amputation next week.  Daily HL remains therapeutic at 0.57 on heparin 1700 units/hr. CBC stable. No bleeding noted.   Goal of Therapy:  Heparin level 0.3-0.7 units/ml Monitor platelets by anticoagulation protocol: Yes   Plan:  Continue heparin 1700 units/hr Daily heparin level and CBC Monitor s/sx of bleeding F/u vascular surgery plan for amputation  Andrey Cota. Diona Foley, PharmD, [place] Clinical Pharmacist Pager [phone] 07/15/2016 8:38 AM

## 2016-07-15 NOTE — Progress Notes (Signed)
Pt's wife called to state that Dr Trula Slade called her and they have agreed to go forth with the left great toe amputation on 1/24.

## 2016-07-15 NOTE — Progress Notes (Signed)
Notified by lab that blood hemolyzed and they will draw blood again.  again.   Tamkia Temples, RN

## 2016-07-15 NOTE — Progress Notes (Signed)
  Progress Note    07/15/2016 10:08 AM 5 Days Post-Op  Subjective:  No new complaints  Vitals:   07/15/16 0633 07/15/16 0928  BP: (!) 147/54 128/73  Pulse: 66 (!) 59  Resp: 18 18  Temp: 97.5 F (36.4 C) 98.1 F (36.7 C)    Physical Exam: Awake and alert  non labored respirations Left foot dressing cdi Right groin without rash  CBC    Component Value Date/Time   WBC 12.7 (H) 07/15/2016 0500   RBC 4.14 (L) 07/15/2016 0500   HGB 11.8 (L) 07/15/2016 0500   HGB 14.1 11/22/2010 0953   HCT 36.6 (L) 07/15/2016 0500   HCT 42.6 11/22/2010 0953   PLT 275 07/15/2016 0500   PLT 223 11/22/2010 0953   MCV 88.4 07/15/2016 0500   MCV 82 11/22/2010 0953   MCH 28.5 07/15/2016 0500   MCHC 32.2 07/15/2016 0500   RDW 15.5 07/15/2016 0500   RDW 16.3 (H) 11/22/2010 0953   LYMPHSABS 2.1 07/06/2016 0433   LYMPHSABS 2.4 11/22/2010 0953   MONOABS 1.0 07/06/2016 0433   EOSABS 0.2 07/06/2016 0433   EOSABS 0.4 11/22/2010 0953   BASOSABS 0.0 07/06/2016 0433   BASOSABS 0.1 11/22/2010 0953    BMET    Component Value Date/Time   NA 132 (L) 07/15/2016 0500   K 4.7 07/15/2016 0500   CL 92 (L) 07/15/2016 0500   CO2 32 07/15/2016 0500   GLUCOSE 139 (H) 07/15/2016 0500   GLUCOSE 229 08/07/2008 0000   BUN 37 (H) 07/15/2016 0500   CREATININE 1.31 (H) 07/15/2016 0500   CREATININE 1.17 03/19/2016 1534   CALCIUM 10.5 (H) 07/15/2016 0500   GFRNONAA 50 (L) 07/15/2016 0500   GFRAA 58 (L) 07/15/2016 0500    INR    Component Value Date/Time   INR 1.12 07/15/2016 0721   INR 1.7 09/02/2010 1323     Intake/Output Summary (Last 24 hours) at 07/15/16 1008 Last data filed at 07/15/16 0900  Gross per 24 hour  Intake           660.03 ml  Output              200 ml  Net           460.03 ml     Assessment/Plan:  82 y.o. male is s/p angiogram that was difficult for him to tolerated. Plan to repeat in OR tomorrow with anesthesia with possible need for pedal access and will also debride 1st  metatarsal head and sesmoid bone.    Zebedee Segundo C. Donzetta Matters, MD [facility] [place] Office: [phone] Pager: [phone]  07/15/2016 10:08 AM

## 2016-07-15 NOTE — Progress Notes (Signed)
Patient refused to wash up. Assistance/Supplies offered, pt refused to get up; wished to remain lying down/sleeping.

## 2016-07-15 NOTE — Progress Notes (Signed)
Pt's wife called, requested time/updated plan for surgery tomorrow. Mrs Bourbon was informed that the current schedule indicates 1115 tomorrow for surgery. Mrs Isaksen informed that we do need her to sign consent for the new procedure plan. She agrees, states she will be here this evening, possibly with night shift; as she must wait on her daughter to get here.   Mrs Kahle seemed to understand the plan, agreed with MD Notes relayed to her. Pt requested his wife call him back shortly as he is on the phone with his sister at this time.

## 2016-07-15 NOTE — Progress Notes (Signed)
PT Cancellation Note  Patient Details Name: [name]. MRN: [name] DOB: Jun 14, 1935   Cancelled Treatment:    Reason Eval/Treat Not Completed: Patient declined, no reason specified   [place] 07/15/2016, 3:11 PM [place]

## 2016-07-15 NOTE — Progress Notes (Signed)
[facility] PROGRESS NOTE  [name] [id number] DOB: 12-Nov-1934 DOA: 07/03/2016 PCP: [name], MD  Assessment/Plan: 82 y.o.malewith PMH of Dementia, poorly controlled DM, severe AS status post redo aVR (bioprosthetic valve), chronic diastolic heart failure, CAD status post CABG, chronic valvular atrial fibrillation on Coumadin, history of left atrial thrombus noted on TEE in 7/12, history of peripheral vascular disease s/p PTCA of R tibial artery and Left SFA stent placement,  amputation of L great toe and subsequent left foot ulcer with osteomyelitis on Keflex was referred from the infectious disease MDs office for evaluation of hypoxia. Found to have acute on chronic diastolic heart failure and started on diuresis. Infectious disease and vascular surgery is following for chronic osteomyelitis, awaiting left first MT amputation per vascular surgery on wednesday   Acute diastolic CHF (congestive heart failure), acute hypoxic resp failure. Echo. LVEF 55-60%, increased LV pressure, severe pulmonary HTN at 83 mm Hg.  -initially, responded to diuresis with lasix on admission and transitioned to Lasix to oral home dose. On 1/17, exam consistent with recurrence of effusion, CXR confirmed recurrence of moderate sized R effusion. Patient was restarted IV Lasix TID. 1/22: congestion is improved with lasix, repeat CXR-> improvement. Transitioned to oral lasix. -Cont monitor renal function, I and O, daily weights. Hold lisinopril, spironolactone due to elevated creatinine (recent angiogram as well)   Right Pleural effusion. as above. Underwent thoracentesis 1/12 with removal of 1.8 L yellow fluid - per Light's criteria, fluid is transudate. Repeat CXR showed improvement. Cont diuresis     Diabetic 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 with PVD. Has been on Keflex at home but wound continues to be infected. re-consulted ID and Vascular surgery. Underwent Angiogram: has single vessel run off  to left foot. On iv cefazolin  -awaiting vascular surgery plans for amputation  On Wednesday.   Candida dermatitis of groin. Fluconazole started by ID- added Nystatin powder  Chronic atrial fibrillation. CHA2DS2-VASc Score at least 6. not on rate controlling agent- holding Coumain for procedures - Heparin started  CAD s/p CABG. not on ASA- cont statin. PACEMAKER, PERMANENT/   Complete heart block  -H/o atrial thrombus. on Coumadin- switched to Heparin for bridging S/P AVR (aortic valve replacement). Bioprosthetic  Malnutrition of moderate degree. supplements ordered DM. Will hold mealtime insulin due to episode of low glucose. monitor on lantus, add ISS. ha1c-7.4  Dementia. very poor short term memory- no behavioral disturbances   Code Status: full Family Communication: d/w patient, rn (indicate person spoken with, relationship, and if by phone, the number) Disposition Plan: obtain pt post op    Consultants:  Vascular surgery  infectious disease   Procedures:   Thoracentesis  Antimicrobials:            Anti-infectives    Start     Dose/Rate Route Frequency Ordered Stop   07/14/16 0600  ceFAZolin (ANCEF) IVPB 1 g/50 mL premix  Status:  Discontinued    Comments:  Send with pt to OR   1 g 100 mL/hr over 30 Minutes Intravenous To Short Stay 07/11/16 0820 07/11/16 1139   07/11/16 1200  ceFAZolin (ANCEF) IVPB 2g/100 mL premix     2 g 200 mL/hr over 30 Minutes Intravenous Every 8 hours 07/11/16 1138     07/05/16 1330  fluconazole (DIFLUCAN) tablet 100 mg     100 mg Oral Daily 07/05/16 1231     07/04/16 2200  vancomycin (VANCOCIN) 1,750 mg in sodium chloride 0.9 % 500 mL IVPB  Status:  Discontinued     1,750 mg 250 mL/hr over 120 Minutes Intravenous Every 24 hours 07/04/16 2132 07/05/16 1231   07/03/16 1930  cephALEXin (KEFLEX) capsule 500 mg  Status:  Discontinued             HPI/Subjective: Alert, oriented to person, place. No acute pains. Denies acute  shortness of breath. No cough. Awaiting surgery tomorrow   Objective: Vitals:   07/14/16 2058 07/15/16 0633  BP: 124/61 (!) 147/54  Pulse: 60 66  Resp: 18 18  Temp: 98 F (36.7 C) 97.5 F (36.4 C)    Intake/Output Summary (Last 24 hours) at 07/15/16 0743 Last data filed at 07/15/16 0646  Gross per 24 hour  Intake           760.03 ml  Output              200 ml  Net           560.03 ml   Filed Weights   07/13/16 0548 07/14/16 0647 07/15/16 0633  Weight: 87.7 kg (193 lb 4.8 oz) 87.1 kg (192 lb) 87.3 kg (192 lb 8 oz)    Exam:   General:  No distress  Cardiovascular: s1,s2 irregular   Respiratory: few crackles RLL  Abdomen: soft, nt, nd   Musculoskeletal: pedal edema   Data Reviewed: Basic Metabolic Panel:  Recent Labs Lab 07/10/16 0420 07/11/16 0549 07/12/16 0655 07/14/16 0450 07/15/16 0500  NA 135 135 134* 133* 132*  K 4.5 4.1 4.2 4.4 4.7  CL 94* 94* 91* 92* 92*  CO2 32 35* 33* 34* 32  GLUCOSE 114* 79 161* 144* 139*  BUN 37* 33* 37* 40* 37*  CREATININE 1.09 1.13 1.29* 1.52* 1.31*  CALCIUM 10.0 10.2 10.3 10.3 10.5*   Liver Function Tests: No results for input(s): AST, ALT, ALKPHOS, BILITOT, PROT, ALBUMIN in the last 168 hours. No results for input(s): LIPASE, AMYLASE in the last 168 hours. No results for input(s): AMMONIA in the last 168 hours. CBC:  Recent Labs Lab 07/11/16 0549 07/12/16 0654 07/13/16 0352 07/14/16 0450 07/15/16 0500  WBC 10.8* 11.4* 14.3* 11.9* 12.7*  HGB 11.7* 12.0* 11.6* 12.2* 11.8*  HCT 37.0* 38.1* 35.6* 37.9* 36.6*  MCV 87.9 88.2 87.7 88.3 88.4  PLT 229 235 288 254 275   Cardiac Enzymes: No results for input(s): CKTOTAL, CKMB, CKMBINDEX, TROPONINI in the last 168 hours. BNP (last 3 results)  Recent Labs  07/03/16 1540  BNP 178.7*    ProBNP (last 3 results) No results for input(s): PROBNP in the last 8760 hours.  CBG:  Recent Labs Lab 07/14/16 0601 07/14/16 1057 07/14/16 1627 07/14/16 2143  07/15/16 0645  GLUCAP 148* 203* 250* 172* 157*    Recent Results (from the past 240 hour(s))  Blood Cultures x 2 sites     Status: None   Collection Time: 07/05/16  3:34 PM  Result Value Ref Range Status   Specimen Description BLOOD RIGHT ARM  Final   Special Requests BOTTLES DRAWN AEROBIC AND ANAEROBIC 4CC  Final   Culture NO GROWTH 5 DAYS  Final   Report Status 07/10/2016 FINAL  Final  Blood Cultures x 2 sites     Status: None   Collection Time: 07/05/16  3:44 PM  Result Value Ref Range Status   Specimen Description BLOOD LEFT ARM  Final   Special Requests BOTTLES DRAWN AEROBIC AND ANAEROBIC 5CC  Final   Culture NO GROWTH 5 DAYS  Final   Report  Status 07/10/2016 FINAL  Final     Studies: Dg Chest 2 View  Result Date: 07/14/2016 CLINICAL DATA:  Shortness of breath. EXAM: CHEST  2 VIEW COMPARISON:  07/12/2016 FINDINGS: The heart is mildly enlarged but stable. Stable right ventricular pacer wire. The lungs demonstrate much improved aeration with resolving pulmonary edema. There is a persistent right-sided pleural effusion and overlying atelectasis. IMPRESSION: Resolving pulmonary edema. Persistent small right pleural effusion and overlying atelectasis. Electronically Signed   By: Marijo Sanes M.D.   On: 07/14/2016 12:06    Scheduled Meds: .  ceFAZolin (ANCEF) IV  2 g Intravenous Q8H  . collagenase   Topical Daily  . donepezil  10 mg Oral QHS  . ezetimibe-simvastatin  1 tablet Oral QHS  . feeding supplement (ENSURE ENLIVE)  237 mL Oral Q24H  . fluconazole  100 mg Oral Daily  . furosemide  40 mg Oral BID  . insulin aspart  0-9 Units Subcutaneous TID WC  . insulin glargine  12 Units Subcutaneous QHS  . multivitamin with minerals  1 tablet Oral Daily  . nystatin   Topical TID  . protein supplement shake  11 oz Oral Q24H  . sodium chloride flush  3 mL Intravenous Q12H   Continuous Infusions: . heparin 1,700 Units/hr (07/15/16 0400)    Principal Problem:   Diabetic foot  ulcer with osteomyelitis ([place]) Active Problems:   Obstructive sleep apnea   S/P AVR (aortic valve replacement)   PACEMAKER, PERMANENT   Aortic valve stenosis, severe prosthetic   Chronic atrial fibrillation ([name])   Complete heart block ([name])   Hypoxia   Acute diastolic CHF (congestive heart failure) ([name])   Malnutrition of moderate degree   Pleural effusion   Congestive heart failure ([place])   S/P thoracentesis   Wound abscess    Time spent: >35 minutes     Kinnie Feil  [facility] Pager [phone]. If 7PM-7AM, please contact night-coverage at [email or web address], password [facility] 07/15/2016, 7:43 AM  LOS: 12 days

## 2016-07-15 NOTE — Progress Notes (Signed)
Pt's wife was called for a telephone consent because there was an error on the previous consent form that she signed today 07-15-16 at 2111 while she was on the unit: pt's last and first names were reversed on the consent form.

## 2016-07-15 NOTE — Care Management Important Message (Signed)
Important Message  Patient Details  Name: [name]. MRN: [id number] Date of Birth: 1935-01-29   Medicare Important Message Given:  Yes    Chloe Miyoshi 07/15/2016, 11:44 AM

## 2016-07-16 ENCOUNTER — Encounter (HOSPITAL_COMMUNITY)
Admission: EM | Disposition: A | Discharge status: Skilled Nursing Facility | Payer: Self-pay | Source: Home / Self Care | Attending: Internal Medicine

## 2016-07-16 ENCOUNTER — Inpatient Hospital Stay (HOSPITAL_COMMUNITY): Payer: Medicare Other | Admitting: Certified Registered Nurse Anesthetist

## 2016-07-16 DIAGNOSIS — I5031 Acute diastolic (congestive) heart failure: Secondary | ICD-10-CM

## 2016-07-16 DIAGNOSIS — I350 Nonrheumatic aortic (valve) stenosis: Secondary | ICD-10-CM

## 2016-07-16 DIAGNOSIS — T814XXD Infection following a procedure, subsequent encounter: Secondary | ICD-10-CM

## 2016-07-16 DIAGNOSIS — M869 Osteomyelitis, unspecified: Secondary | ICD-10-CM

## 2016-07-16 DIAGNOSIS — J90 Pleural effusion, not elsewhere classified: Secondary | ICD-10-CM

## 2016-07-16 DIAGNOSIS — I482 Chronic atrial fibrillation: Secondary | ICD-10-CM

## 2016-07-16 DIAGNOSIS — L97509 Non-pressure chronic ulcer of other part of unspecified foot with unspecified severity: Secondary | ICD-10-CM

## 2016-07-16 HISTORY — PX: ULTRASOUND GUIDANCE FOR VASCULAR ACCESS: SHX6516

## 2016-07-16 HISTORY — PX: ILIAC ATHERECTOMY: SHX6328

## 2016-07-16 HISTORY — PX: AMPUTATION: SHX166

## 2016-07-16 HISTORY — PX: ANGIOPLASTY ILLIAC ARTERY: SHX5720

## 2016-07-16 HISTORY — PX: AORTOGRAM: SHX6300

## 2016-07-16 LAB — SURGICAL PCR SCREEN
MRSA, PCR: NEGATIVE
Staphylococcus aureus: NEGATIVE

## 2016-07-16 LAB — CBC
HCT: 35.7 % — ABNORMAL LOW (ref 39.0–52.0)
Hemoglobin: 11.2 g/dL — ABNORMAL LOW (ref 13.0–17.0)
MCH: 27.9 pg (ref 26.0–34.0)
MCHC: 31.4 g/dL (ref 30.0–36.0)
MCV: 89 fL (ref 78.0–100.0)
PLATELETS: 262 10*3/uL (ref 150–400)
RBC: 4.01 MIL/uL — AB (ref 4.22–5.81)
RDW: 15.6 % — AB (ref 11.5–15.5)
WBC: 13.2 10*3/uL — ABNORMAL HIGH (ref 4.0–10.5)

## 2016-07-16 LAB — GLUCOSE, CAPILLARY
Glucose-Capillary: 120 mg/dL — ABNORMAL HIGH (ref 65–99)
Glucose-Capillary: 149 mg/dL — ABNORMAL HIGH (ref 65–99)

## 2016-07-16 LAB — HEPARIN LEVEL (UNFRACTIONATED): Heparin Unfractionated: 0.91 IU/mL — ABNORMAL HIGH (ref 0.30–0.70)

## 2016-07-16 LAB — PROTIME-INR
INR: 1.29
PROTHROMBIN TIME: 16.2 s — AB (ref 11.4–15.2)

## 2016-07-16 SURGERY — AORTOGRAM
Anesthesia: General | Site: Leg Upper

## 2016-07-16 MED ORDER — NON FORMULARY
Status: DC | PRN
  Administered 2016-07-16: 5 mg

## 2016-07-16 MED ORDER — NITROGLYCERIN IN D5W 200-5 MCG/ML-% IV SOLN
0.0000 ug/min | INTRAVENOUS | Status: DC
  Filled 2016-07-16: qty 250

## 2016-07-16 MED ORDER — FENTANYL CITRATE (PF) 250 MCG/5ML IJ SOLN
INTRAMUSCULAR | Status: AC
Start: 2016-07-16 — End: 2016-07-16
  Filled 2016-07-16: qty 5

## 2016-07-16 MED ORDER — HEPARIN (PORCINE) IN NACL 100-0.45 UNIT/ML-% IJ SOLN
1500.0000 [IU]/h | INTRAMUSCULAR | Status: DC
  Administered 2016-07-16 – 2016-07-20 (×7): 1500 [IU]/h via INTRAVENOUS
  Filled 2016-07-16 (×6): qty 250

## 2016-07-16 MED ORDER — SUGAMMADEX SODIUM 200 MG/2ML IV SOLN
INTRAVENOUS | Status: DC | PRN
  Administered 2016-07-16: 200 mg via INTRAVENOUS

## 2016-07-16 MED ORDER — ROCURONIUM BROMIDE 10 MG/ML (PF) SYRINGE
PREFILLED_SYRINGE | INTRAVENOUS | Status: DC | PRN
  Administered 2016-07-16: 50 mg via INTRAVENOUS
  Administered 2016-07-16 (×2): 20 mg via INTRAVENOUS
  Administered 2016-07-16: 10 mg via INTRAVENOUS

## 2016-07-16 MED ORDER — OXYCODONE HCL 5 MG PO TABS: 5.0000 mg | ORAL_TABLET | Freq: Once | ORAL | Status: DC | PRN

## 2016-07-16 MED ORDER — IOPAMIDOL (ISOVUE-300) INJECTION 61%
INTRAVENOUS | Status: DC | PRN
  Administered 2016-07-16: 70 mL via INTRAVENOUS

## 2016-07-16 MED ORDER — ONDANSETRON HCL 4 MG/2ML IJ SOLN
INTRAMUSCULAR | Status: AC
  Filled 2016-07-16: qty 2

## 2016-07-16 MED ORDER — PHENYLEPHRINE HCL 10 MG/ML IJ SOLN
INTRAMUSCULAR | Status: DC | PRN
  Administered 2016-07-16: 25 ug/min via INTRAVENOUS

## 2016-07-16 MED ORDER — SODIUM CHLORIDE 0.9 % IV SOLN
1.0000 mL/kg/h | INTRAVENOUS | Status: AC
  Administered 2016-07-16: 1 mL/kg/h via INTRAVENOUS

## 2016-07-16 MED ORDER — ONDANSETRON HCL 4 MG/2ML IJ SOLN
INTRAMUSCULAR | Status: DC | PRN
  Administered 2016-07-16: 4 mg via INTRAVENOUS

## 2016-07-16 MED ORDER — LIDOCAINE HCL (PF) 1 % IJ SOLN
INTRAMUSCULAR | Status: AC
  Filled 2016-07-16: qty 30

## 2016-07-16 MED ORDER — PROPOFOL 10 MG/ML IV BOLUS
INTRAVENOUS | Status: DC | PRN
  Administered 2016-07-16: 100 mg via INTRAVENOUS

## 2016-07-16 MED ORDER — SODIUM CHLORIDE 0.9 % IV SOLN
INTRAVENOUS | Status: DC | PRN
  Administered 2016-07-16: 500 mL

## 2016-07-16 MED ORDER — PHENYLEPHRINE 40 MCG/ML (10ML) SYRINGE FOR IV PUSH (FOR BLOOD PRESSURE SUPPORT)
PREFILLED_SYRINGE | INTRAVENOUS | Status: DC | PRN
  Administered 2016-07-16: 80 ug via INTRAVENOUS
  Administered 2016-07-16: 120 ug via INTRAVENOUS
  Administered 2016-07-16: 80 ug via INTRAVENOUS

## 2016-07-16 MED ORDER — ONDANSETRON HCL 4 MG/2ML IJ SOLN: 4.0000 mg | Freq: Four times a day (QID) | INTRAMUSCULAR | Status: DC | PRN

## 2016-07-16 MED ORDER — FENTANYL CITRATE (PF) 100 MCG/2ML IJ SOLN
INTRAMUSCULAR | Status: DC | PRN
  Administered 2016-07-16: 50 ug via INTRAVENOUS
  Administered 2016-07-16: 100 ug via INTRAVENOUS

## 2016-07-16 MED ORDER — LACTATED RINGERS IV SOLN
INTRAVENOUS | Status: DC
  Administered 2016-07-16 – 2016-07-17 (×4): via INTRAVENOUS

## 2016-07-16 MED ORDER — PROTAMINE SULFATE 10 MG/ML IV SOLN
INTRAVENOUS | Status: DC | PRN
  Administered 2016-07-16: 20 mg via INTRAVENOUS
  Administered 2016-07-16: 30 mg via INTRAVENOUS
  Administered 2016-07-16: 20 mg via INTRAVENOUS
  Administered 2016-07-16: 30 mg via INTRAVENOUS

## 2016-07-16 MED ORDER — VERAPAMIL HCL 2.5 MG/ML IV SOLN
INTRAVENOUS | Status: DC | PRN
  Administered 2016-07-16: 5 mg via INTRAVENOUS

## 2016-07-16 MED ORDER — ASPIRIN EC 81 MG PO TBEC
81.0000 mg | DELAYED_RELEASE_TABLET | Freq: Every day | ORAL | Status: DC
  Administered 2016-07-17 – 2016-07-21 (×5): 81 mg via ORAL
  Filled 2016-07-16 (×6): qty 1

## 2016-07-16 MED ORDER — VERAPAMIL HCL 2.5 MG/ML IV SOLN
5.0000 mg | INTRAVENOUS | Status: DC
  Filled 2016-07-16: qty 2

## 2016-07-16 MED ORDER — ACETAMINOPHEN 325 MG PO TABS: 650.0000 mg | ORAL_TABLET | ORAL | Status: DC | PRN

## 2016-07-16 MED ORDER — OXYCODONE HCL 5 MG/5ML PO SOLN: 5.0000 mg | Freq: Once | ORAL | Status: DC | PRN

## 2016-07-16 MED ORDER — 0.9 % SODIUM CHLORIDE (POUR BTL) OPTIME
TOPICAL | Status: DC | PRN
  Administered 2016-07-16: 1000 mL

## 2016-07-16 MED ORDER — PROPOFOL 10 MG/ML IV BOLUS
INTRAVENOUS | Status: AC
  Filled 2016-07-16: qty 20

## 2016-07-16 MED ORDER — SUCCINYLCHOLINE CHLORIDE 200 MG/10ML IV SOSY
PREFILLED_SYRINGE | INTRAVENOUS | Status: DC | PRN
  Administered 2016-07-16: 100 mg via INTRAVENOUS

## 2016-07-16 MED ORDER — LIDOCAINE 2% (20 MG/ML) 5 ML SYRINGE
INTRAMUSCULAR | Status: DC | PRN
  Administered 2016-07-16: 60 mg via INTRAVENOUS

## 2016-07-16 MED ORDER — CEFAZOLIN SODIUM 1 G IJ SOLR
INTRAMUSCULAR | Status: DC | PRN
  Administered 2016-07-16: 2 g via INTRAMUSCULAR

## 2016-07-16 MED ORDER — FENTANYL CITRATE (PF) 100 MCG/2ML IJ SOLN: 25.0000 ug | INTRAMUSCULAR | Status: DC | PRN

## 2016-07-16 MED ORDER — HEPARIN SODIUM (PORCINE) 1000 UNIT/ML IJ SOLN
INTRAMUSCULAR | Status: DC | PRN
  Administered 2016-07-16: 9000 [IU] via INTRAVENOUS
  Administered 2016-07-16 (×2): 3000 [IU] via INTRAVENOUS

## 2016-07-16 MED ORDER — SODIUM CHLORIDE 0.9 % IV SOLN
INTRAVENOUS | Status: DC | PRN
  Administered 2016-07-16: 1000 mL via SURGICAL_CAVITY

## 2016-07-16 SURGICAL SUPPLY — 88 items
ADH SKN CLS APL DERMABOND .7 (GAUZE/BANDAGES/DRESSINGS)
BAG ISL DRAPE 18X18 STRL (DRAPES) ×5
BAG ISOLATION DRAPE 18X18 (DRAPES) IMPLANT
BALLN COYOTE OTW 3X80X150 (BALLOONS) ×7
BALLN IN.PACT DCB 6X150 (BALLOONS) ×7
BALLOON COYOTE OTW 3X80X150 (BALLOONS) ×1 IMPLANT
BANDAGE ACE 4X5 VEL STRL LF (GAUZE/BANDAGES/DRESSINGS) ×2 IMPLANT
BLADE AVERAGE 25MMX9MM (BLADE)
BLADE AVERAGE 25X9 (BLADE) ×5 IMPLANT
BLADE SAW SGTL 81X20 HD (BLADE) IMPLANT
BLADE SURG 11 STRL SS (BLADE) ×3 IMPLANT
BNDG GAUZE ELAST 4 BULKY (GAUZE/BANDAGES/DRESSINGS) ×2 IMPLANT
CANISTER SUCTION 2500CC (MISCELLANEOUS) ×7 IMPLANT
CATH CROSS OVER TEMPO 5F (CATHETERS) ×2 IMPLANT
CATH CXI 2.6F 90 ANG (CATHETERS) ×7
CATH CXI SUPP ANG 2.6FR 150CM (MICROCATHETER) ×2 IMPLANT
CATH OMNI FLUSH .035X70CM (CATHETERS) ×7 IMPLANT
CATH QUICKCROSS SUPP .035X90CM (MICROCATHETER) ×2 IMPLANT
CATH SPRT ANG 90X2.6FR (CATHETERS) ×1 IMPLANT
COVER BACK TABLE 80X110 HD (DRAPES) ×3 IMPLANT
COVER DOME SNAP 22 D (MISCELLANEOUS) ×7 IMPLANT
COVER PROBE W GEL 5X96 (DRAPES) ×7 IMPLANT
COVER SURGICAL LIGHT HANDLE (MISCELLANEOUS) ×7 IMPLANT
CUFF TOURNIQUET SINGLE 24IN (TOURNIQUET CUFF) IMPLANT
CUFF TOURNIQUET SINGLE 34IN LL (TOURNIQUET CUFF) IMPLANT
CUFF TOURNIQUET SINGLE 44IN (TOURNIQUET CUFF) IMPLANT
DCB IN.PACT 6X150 (BALLOONS) IMPLANT
DERMABOND ADVANCED (GAUZE/BANDAGES/DRESSINGS)
DERMABOND ADVANCED .7 DNX12 (GAUZE/BANDAGES/DRESSINGS) ×5 IMPLANT
DEVICE INFLATION ENCORE 26 (MISCELLANEOUS) ×3 IMPLANT
DEVICE TORQUE KENDALL .025-038 (MISCELLANEOUS) ×4 IMPLANT
DIAMONDBACK CLASSIC OAS 1.5MM (CATHETERS) ×7
DRAPE HALF SHEET 40X57 (DRAPES) ×7 IMPLANT
DRAPE ISOLATION BAG 18X18 (DRAPES) ×2
DRAPE ORTHO SPLIT 77X108 STRL (DRAPES) ×14
DRAPE SURG ORHT 6 SPLT 77X108 (DRAPES) IMPLANT
DRSG ADAPTIC 3X8 NADH LF (GAUZE/BANDAGES/DRESSINGS) ×4 IMPLANT
DRSG TEGADERM 4X4.75 (GAUZE/BANDAGES/DRESSINGS) ×3 IMPLANT
ELECT REM PT RETURN 9FT ADLT (ELECTROSURGICAL) ×7
ELECTRODE REM PT RTRN 9FT ADLT (ELECTROSURGICAL) ×5 IMPLANT
GAUZE SPONGE 4X4 12PLY STRL (GAUZE/BANDAGES/DRESSINGS) ×4 IMPLANT
GAUZE SPONGE 4X4 16PLY XRAY LF (GAUZE/BANDAGES/DRESSINGS) ×3 IMPLANT
GLOVE BIO SURGEON STRL SZ7.5 (GLOVE) ×7 IMPLANT
GOWN STRL REUS W/ TWL LRG LVL3 (GOWN DISPOSABLE) ×10 IMPLANT
GOWN STRL REUS W/ TWL XL LVL3 (GOWN DISPOSABLE) ×5 IMPLANT
GOWN STRL REUS W/TWL LRG LVL3 (GOWN DISPOSABLE) ×14
GOWN STRL REUS W/TWL XL LVL3 (GOWN DISPOSABLE) ×7
GUIDEWIRE ANGLED .035X260CM (WIRE) ×3 IMPLANT
GUIDEWIRE STR TIP .014X300X8 (WIRE) ×2 IMPLANT
KIT BASIN OR (CUSTOM PROCEDURE TRAY) ×7 IMPLANT
KIT ROOM TURNOVER OR (KITS) ×7 IMPLANT
LUBRICANT VIPERSLIDE CORONARY (MISCELLANEOUS) ×2 IMPLANT
NDL HYPO 25GX1X1/2 BEV (NEEDLE) IMPLANT
NDL PERC 18GX7CM (NEEDLE) ×4 IMPLANT
NEEDLE HYPO 25GX1X1/2 BEV (NEEDLE) IMPLANT
NEEDLE PERC 18GX7CM (NEEDLE) ×7 IMPLANT
NS IRRIG 1000ML POUR BTL (IV SOLUTION) ×7 IMPLANT
PACK GENERAL/GYN (CUSTOM PROCEDURE TRAY) ×7 IMPLANT
PAD ARMBOARD 7.5X6 YLW CONV (MISCELLANEOUS) ×14 IMPLANT
PADDING CAST ABS 6INX4YD NS (CAST SUPPLIES)
PADDING CAST ABS COTTON 6X4 NS (CAST SUPPLIES) IMPLANT
SET MICROPUNCTURE 5F STIFF (MISCELLANEOUS) ×5 IMPLANT
SHEATH AVANTI 11CM 5FR (MISCELLANEOUS) ×5 IMPLANT
SHEATH HIGHFLEX ANSEL 6FRX55 (SHEATH) ×4 IMPLANT
SHEATH PINNACLE 6F 10CM (SHEATH) ×4 IMPLANT
SNARE GOOSENECK 10MM (VASCULAR PRODUCTS) ×2 IMPLANT
SPONGE GAUZE 4X4 12PLY STER LF (GAUZE/BANDAGES/DRESSINGS) ×2 IMPLANT
STOPCOCK MORSE 400PSI 3WAY (MISCELLANEOUS) ×7 IMPLANT
SUT ETHILON 3 0 PS 1 (SUTURE) ×9 IMPLANT
SUT MNCRL AB 4-0 PS2 18 (SUTURE) ×3 IMPLANT
SUT SILK 2 0 SH (SUTURE) IMPLANT
SUT SILK 3 0 SH 30 (SUTURE) ×8 IMPLANT
SYR 10ML LL (SYRINGE) ×12 IMPLANT
SYR 20CC LL (SYRINGE) ×4 IMPLANT
SYR CONTROL 10ML LL (SYRINGE) IMPLANT
SYSTEM DIMNDBCK CLSC OAS 1.5MM (CATHETERS) IMPLANT
TOWEL OR 17X24 6PK STRL BLUE (TOWEL DISPOSABLE) ×7 IMPLANT
TOWEL OR 17X26 10 PK STRL BLUE (TOWEL DISPOSABLE) ×7 IMPLANT
TRAY FOLEY W/METER SILVER 16FR (SET/KITS/TRAYS/PACK) IMPLANT
TUBING EXTENTION W/L.L. (IV SETS) IMPLANT
TUBING HIGH PRESSURE 120CM (CONNECTOR) ×5 IMPLANT
UNDERPAD 30X30 (UNDERPADS AND DIAPERS) ×7 IMPLANT
WATER STERILE IRR 1000ML POUR (IV SOLUTION) ×7 IMPLANT
WIRE BENTSON .035X145CM (WIRE) ×11 IMPLANT
WIRE G V18X300CM (WIRE) ×2 IMPLANT
WIRE HI TORQ VERSACORE J 260CM (WIRE) ×2 IMPLANT
WIRE HI-TORQ WINN 40 .14X300CM (WIRE) ×3 IMPLANT
WIRE VIPER ADVANCE .017X335CM (WIRE) ×4 IMPLANT

## 2016-07-16 NOTE — Progress Notes (Signed)
  Progress Note    07/16/2016 11:46 AM Day of Surgery  Subjective:  No overnight issues  Vitals:   07/16/16 0401 07/16/16 0754  BP: (!) 110/51 (!) 124/54  Pulse: 62 65  Resp: 18 20  Temp: 98.5 F (36.9 C) 97.6 F (36.4 C)    Physical Exam: Awake and alert Palpable femoral pulses bilaterally Right groin without erythema Left foot dressing cdi  CBC    Component Value Date/Time   WBC 13.2 (H) 07/16/2016 0530   RBC 4.01 (L) 07/16/2016 0530   HGB 11.2 (L) 07/16/2016 0530   HGB 14.1 11/22/2010 0953   HCT 35.7 (L) 07/16/2016 0530   HCT 42.6 11/22/2010 0953   PLT 262 07/16/2016 0530   PLT 223 11/22/2010 0953   MCV 89.0 07/16/2016 0530   MCV 82 11/22/2010 0953   MCH 27.9 07/16/2016 0530   MCHC 31.4 07/16/2016 0530   RDW 15.6 (H) 07/16/2016 0530   RDW 16.3 (H) 11/22/2010 0953   LYMPHSABS 2.1 07/06/2016 0433   LYMPHSABS 2.4 11/22/2010 0953   MONOABS 1.0 07/06/2016 0433   EOSABS 0.2 07/06/2016 0433   EOSABS 0.4 11/22/2010 0953   BASOSABS 0.0 07/06/2016 0433   BASOSABS 0.1 11/22/2010 0953    BMET    Component Value Date/Time   NA 132 (L) 07/15/2016 0500   K 4.7 07/15/2016 0500   CL 92 (L) 07/15/2016 0500   CO2 32 07/15/2016 0500   GLUCOSE 139 (H) 07/15/2016 0500   GLUCOSE 229 08/07/2008 0000   BUN 37 (H) 07/15/2016 0500   CREATININE 1.31 (H) 07/15/2016 0500   CREATININE 1.17 03/19/2016 1534   CALCIUM 10.5 (H) 07/15/2016 0500   GFRNONAA 50 (L) 07/15/2016 0500   GFRAA 58 (L) 07/15/2016 0500    INR    Component Value Date/Time   INR 1.29 07/16/2016 0530   INR 1.7 09/02/2010 1323     Intake/Output Summary (Last 24 hours) at 07/16/16 1146 Last data filed at 07/16/16 1017  Gross per 24 hour  Intake            531.9 ml  Output               75 ml  Net            456.9 ml     Assessment:  82 y.o. male is here with osteo of left foot.  Plan: OR today for angiogram of left lower extremity with possible pedal access and debridement of 1st metatarsal on  left   Jonovan Boedecker C. Donzetta Matters, MD [facility] Office: [phone] Pager: [phone]  07/16/2016 11:46 AM

## 2016-07-16 NOTE — Progress Notes (Signed)
ANTICOAGULATION CONSULT NOTE  Pharmacy Consult:  Heparin Indication:  History of Afib  Allergies  Allergen Reactions  . Potassium-Containing Compounds Anaphylaxis    IV--loss of conciousness  . Metoprolol Succinate Other (See Comments)    Extremely tired    Patient Measurements: Height: 5\' 10"  (177.8 cm) Weight: 189 lb 11.2 oz (86 kg) (scale b) IBW/kg (Calculated) : 73 Heparin Dosing Weight: 88 kg  Vital Signs: Temp: 98 F (36.7 C) (01/24 1700) Temp Source: Oral (01/24 0754) BP: 145/84 (01/24 1642) Pulse Rate: 69 (01/24 1645)  Labs:  Recent Labs  07/14/16 0450 07/15/16 0500 07/15/16 0721 07/16/16 0530  HGB 12.2* 11.8*  --  11.2*  HCT 37.9* 36.6*  --  35.7*  PLT 254 275  --  262  LABPROT 15.1  --  14.5 16.2*  INR 1.19  --  1.12 1.29  HEPARINUNFRC 0.57  --  0.57 0.91*  CREATININE 1.52* 1.31*  --   --     Estimated Creatinine Clearance: 47.2 mL/min (by C-G formula based on SCr of 1.31 mg/dL (H)).  Assessment: 30 YOM on Coumadin PTA for history of Afib, also with history of bioprosthetic AVR and LAA thrombus. Coumadin has been on hold since admission and bridged with IV heparin since 07/07/16. S/p angiogram on 07/10/16 and now s/p OR 1/24 for angiogram and debridement of 1st metatarsal on left.  Pharmacy consulted to resume heparin post-op at [facility], previously running at 1500 units/h. CBC stable, No bleeding noted.   Goal of Therapy:  Heparin level 0.3-0.7 units/ml Monitor platelets by anticoagulation protocol: Yes   Plan:  Resume heparin at 1500 units/h at 2000 tonight (no bolus) 8h heparin level Daily heparin level/CBC Monitor for s/sx bleeding Warfarin on hold F/u Vascular plans   Elicia Lamp, PharmD, BCPS Clinical Pharmacist 07/16/2016 5:43 PM

## 2016-07-16 NOTE — Anesthesia Procedure Notes (Signed)
Procedure Name: Intubation Date/Time: 07/16/2016 12:21 PM Performed by: Everlean Cherry A Pre-anesthesia Checklist: Emergency Drugs available, Suction available, Patient being monitored and Patient identified Patient Re-evaluated:Patient Re-evaluated prior to inductionOxygen Delivery Method: Circle system utilized Preoxygenation: Pre-oxygenation with 100% oxygen Intubation Type: IV induction Ventilation: Mask ventilation without difficulty and Oral airway inserted - appropriate to patient size Laryngoscope Size: Sabra Heck and 2 Grade View: Grade I Tube type: Oral Tube size: 8.0 mm Number of attempts: 1 Airway Equipment and Method: Stylet Placement Confirmation: ETT inserted through vocal cords under direct vision,  positive ETCO2 and breath sounds checked- equal and bilateral Secured at: 24 cm Tube secured with: Tape Dental Injury: Teeth and Oropharynx as per pre-operative assessment

## 2016-07-16 NOTE — Anesthesia Postprocedure Evaluation (Signed)
Anesthesia Post Note  Patient: [name].  Procedure(s) Performed: Procedure(s) (LRB): left lower extremity angiogram (N/A) RECECTION OF LEFT GREAT TOE METATARSAL HEAD and sesamoid (Left) Angioplasty and ATHERECTOMY of left anterior tibial artery (Left) drug coated Balloon ANGIOPLASTY of left superficial femoral artry stent (Left) ULTRASOUND GUIDANCE FOR VASCULAR ACCESS of right common femoral and left dorsalis pedis (Bilateral)  Patient location during evaluation: PACU Anesthesia Type: General Level of consciousness: awake and alert and patient cooperative Pain management: pain level controlled Vital Signs Assessment: post-procedure vital signs reviewed and stable Respiratory status: spontaneous breathing and respiratory function stable Cardiovascular status: stable Anesthetic complications: no       Last Vitals:  Vitals:   07/16/16 1645 07/16/16 1700  BP:    Pulse: 69   Resp: (!) 22   Temp:  36.7 C    Last Pain:  Vitals:   07/16/16 1700  TempSrc:   PainSc: 0-No pain                 Shakura Cowing S

## 2016-07-16 NOTE — Anesthesia Preprocedure Evaluation (Signed)
Anesthesia Evaluation  Patient identified by MRN, date of birth, ID band Patient awake    Reviewed: Allergy & Precautions, H&P , NPO status , Patient's Chart, lab work & pertinent test results  Airway Mallampati: II   Neck ROM: full    Dental   Pulmonary sleep apnea , former smoker,    breath sounds clear to auscultation       Cardiovascular hypertension, + CAD, + CABG, + Peripheral Vascular Disease and +CHF  + dysrhythmias + pacemaker + Valvular Problems/Murmurs  Rhythm:regular Rate:Normal  S/p AVR (bioprosthetic)   Neuro/Psych    GI/Hepatic   Endo/Other  diabetes, Type 2  Renal/GU Renal InsufficiencyRenal disease     Musculoskeletal  (+) Arthritis ,   Abdominal   Peds  Hematology   Anesthesia Other Findings   Reproductive/Obstetrics                             Anesthesia Physical Anesthesia Plan  ASA: III  Anesthesia Plan: General   Post-op Pain Management:    Induction: Intravenous  Airway Management Planned: LMA  Additional Equipment:   Intra-op Plan:   Post-operative Plan:   Informed Consent: I have reviewed the patients History and Physical, chart, labs and discussed the procedure including the risks, benefits and alternatives for the proposed anesthesia with the patient or authorized representative who has indicated his/her understanding and acceptance.     Plan Discussed with: CRNA, Anesthesiologist and Surgeon  Anesthesia Plan Comments:         Anesthesia Quick Evaluation

## 2016-07-16 NOTE — Op Note (Signed)
Patient name: [name]. MRN: [name] DOB: 1934/12/30 Sex: male  07/16/2016 Pre-operative Diagnosis: critical left lower extremity ischemia with osteomyelitis Post-operative diagnosis:  Same Surgeon:  Erlene Quan C. Donzetta Matters, MD Procedure Performed: 1.  US guided cannulation of right common femoral artery 2.  Left lower extremity angiogram 3.  US guided cannulation of left anterior tibial artery 4.  CSI atherectomy of left anterior tibial artery 5.  Balloon angioplasty of left anterior tibial artery with 54mm balloon 6.  Drug coated balloon angioplasty of left sfa in-stent restenosis with 70mm impact admiral 7.  Resection 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 head and sesmoid bone   Indications:  82 year old white male with history of a previous left SFA stenting and first toe amputation. He now has osteomyelitis in his metatarsal head and sesamoid bones with evidence of in-stent restenosis as well as occlusion of his anterior tibial artery. He does have runoff to the ankle via a peroneal artery.  Findings: Iliofemoral segments on the left side were patent. Left SFA was noted to be diffusely stenotic with in-stent stenosis 60%. Popliteal artery was patent as was the peroneal artery to the level of the ankle. The anterior tibial artery had a mid segment occlusion and distally had stenoses greater than 60%.  Following intervention with atherectomy and balloon angioplasty of the anterior tibial artery there was 0% residual stenosis with no flow limiting dissection and filling of the dorsalis pedis artery. Flow through the in-stent restenosis was reduced to less than 20%.   Procedure:  The patient was identified in the holding area and taken to the operating room where he was placed supine on the operating table and general anesthesia was induced. He was given antibiotics sterilely prepped and draped in the right groin left lower extremity in the usual fashion and timeout called. Following this ultrasound  guidance was used to identify the right common femoral artery and this was cannulated with micropuncture needle and microwire was placed and exchanged for sheath. We then placed a Bentson wire and a short 6 [place] sheath. Using up and over catheter and Glidewire we're able to get down into the left SFA. This did require the use of multiple wires and catheters. We then used quick cross catheter to exchanged for stiff wire and placed a long 6 French sheath up and over into the SFA. At this time the patient was heparinized and will remain that way throughout the case. We then used a long Glidewire quick cross catheter to get the level of the popliteal artery on the left and performed lower extremity angiogram from there. The above findings were noted. We then used V 18 V 14 and a weighted wire to attempt to cross the occlusion anterior tibial artery with no avail. We then made the decision to stick retrograde. Ultrasound guidance was used to identify the anterior tibial artery and this was cannulated with micropuncture needle microwire was placed followed by micropuncture sheath. A Glidewire was then used to get up to the level of the occlusion in the anterior tibial artery did angiogram confirmed we were intraluminal. We then used a quick cross catheter as support and attempted to cross from below with the 18 wire but could not. We did get into a subintimal plane and were able to snare from below AV 18 wire from above. This was pulled out and a flossing technique. We then fed a CXI catheter all the way through from the right groin to the left DP artery. We  exchanged for a viper wire and performed CSI atherectomy of the entire anterior tibial artery. Following this we performed balloon angioplasty with a 3 mm balloon of the anterior tibial artery. Completion angiogram demonstrated likely spasm but runoff through the anterior tibial artery to the level of the foot filling the DP. During her last balloon deflation we did  remove the wire into the anterior tibial artery and pressure was held for a period of 3 minutes and there was no extravasation our last angiogram. We then pulled our wire back into the popliteal artery and performed balloon angioplasty with drug-coated balloon of the in-stent stenosis on the left. Completion as above demonstrated minimal residual stenosis without flow-limiting dissection. This time catheters and wires removed the sheath was pulled back into the right external iliac artery and the patient was given 25 mg of protamine. He did have a mild protamine reaction requiring fluids and phenylephrine. ACT was checked and was 170 and sheath was pulled and pressure held. At the same time we then turned attention to the wound on the right foot. A tennis racquet incision was made overlying the wound and necrotic tissue was identified. This was removed the first metatarsal head was mobilized back as far as possible and was clipped healthy bleeding bone was noted. Sesamoid bone was resected with electrocautery. There was noted to be diffuse bleeding through the wound and hemostasis was obtained. We then copiously irrigated the wound and closed the tissue with 3-0 Vicryl and the skin was closed with interrupted nylon sutures. Dry dressing was placed. Patient was allowed to awaken from anesthesia having tolerated the procedure well with minimal complications.    Eureka Valdes C. Donzetta Matters, MD [facility] [place] [facility] Office: [phone] Pager: [phone]

## 2016-07-16 NOTE — Progress Notes (Signed)
[facility]  PROGRESS NOTE  [name]. [id number] DOB: 11-18-1934 DOA: 07/03/2016 PCP: [name], MD   Brief HPI:    82 y.o.malewith PMH of Dementia, poorly controlled DM, severe AS status post redo aVR (bioprosthetic valve), chronic diastolic heart failure, CAD status post CABG, chronic valvular atrial fibrillation on Coumadin, history of left atrial thrombus noted on TEE in 7/12, history of peripheral vascular disease s/p PTCA of R tibial artery and Left SFA stent placement, amputation of L great toe and subsequent left foot ulcer with osteomyelitis on Keflex was referred from the infectious disease MDs office for evaluation of hypoxia. Found to have acute on chronic diastolic heart failure and started on diuresis. Infectious disease and vascular surgery is following for chronic osteomyelitis, awaiting left first MT amputation per vascular surgery.   Subjective   Patient denies pain, no shortness of breath.   Assessment/Plan:     1. Acute hypoxic respiratory failure- secondary to diastolic CHF, EF 0000000, severe injury hypertension at 83 mmHg. Patient was started on diuretics and initially responded to Lasix. On 07/09/2016 patient's exam was consistent with recurrence of effusion, chest x-ray confirmed recurrence of moderate sized right pleural effusion. IV Lasix was restarted 3 times a day. On 07/14/2016 chest x-ray showed improvement and IV Lasix was transitioned to oral Lasix. We'll continue with diuresis with Lasix 40 mg twice a day. Follow BMP in a.m. 2. Right pleural effusion/status post thoracentesis- patient underwent thoracentesis on 07/04/2016 with removal of 1.8 L of yellow fluid. Lites criteria fluid is transudate. Repeat chest x-ray showed improvement. Will continue  diuresis with Lasix. 3. Diabetic foot/osteomyelitis  of left first metatarsal- ID and vascular surgery was consulted. He underwent angiogram on 07/10/16. Plan is for angiogram of left lower  extremity with possible distal excess and debridement of first metatarsal of left foot. Continue IV cefazolin. 4. Candida dermatitis of the groin- continue fluconazole, nystatin powder 5. Chronic atrial fibrillation- CHA2DS2VASc score is 6, not on rate controlling agents. Coumadin is on hold and started on IV heparin for bridging before surgery. 6. CAD, status post CABG- patient not on aspirin, continue statin. Has permanent pacemaker for complete heart block. 7. History of atrial thrombus- patient on Coumadin at home, switched to heparin for bridging. Will restart heparin once okay with vascular surgery after surgery. 8. Status post aVR- patient has bioprosthetic aortic valve. 9. Diabetes mellitus- continue Lantus, sliding scale insulin. 10. Dementia- patient has very poor short-term memory, no behavior disturbance.    DVT prophylaxis: On IV heparin  Code Status: Full code  Family Communication: No family present at bedside   Disposition Plan: Pending outcome of surgery   Consultants:  Vascular surgery  infectious disease  Procedures:  Thoracentesis  Continuous infusions . heparin Stopped (07/16/16 0845)  . lactated ringers 50 mL/hr at 07/16/16 1111      Antibiotics:   Anti-infectives    Start     Dose/Rate Route Frequency Ordered Stop   07/14/16 0600  ceFAZolin (ANCEF) IVPB 1 g/50 mL premix  Status:  Discontinued    Comments:  Send with pt to OR   1 g 100 mL/hr over 30 Minutes Intravenous To Short Stay 07/11/16 0820 07/11/16 1139   07/11/16 1200  [MAR Hold]  ceFAZolin (ANCEF) IVPB 2g/100 mL premix     (MAR Hold since 07/16/16 1008)   2 g 200 mL/hr over 30 Minutes Intravenous Every 8 hours 07/11/16 1138     07/05/16 1330  [MAR Hold]  fluconazole (  DIFLUCAN) tablet 100 mg     (MAR Hold since 07/16/16 1008)   100 mg Oral Daily 07/05/16 1231     07/04/16 2200  vancomycin (VANCOCIN) 1,750 mg in sodium chloride 0.9 % 500 mL IVPB  Status:  Discontinued     1,750 mg 250  mL/hr over 120 Minutes Intravenous Every 24 hours 07/04/16 2132 07/05/16 1231   07/03/16 1930  cephALEXin (KEFLEX) capsule 500 mg  Status:  Discontinued     500 mg Oral Every 8 hours 07/03/16 1835 07/05/16 1458       Objective   Vitals:   07/15/16 1158 07/15/16 2316 07/16/16 0401 07/16/16 0754  BP: (!) 145/52 (!) 120/56 (!) 110/51 (!) 124/54  Pulse: (!) 51 60 62 65  Resp: 18 18 18 20   Temp: 97.9 F (36.6 C) 97.3 F (36.3 C) 98.5 F (36.9 C) 97.6 F (36.4 C)  TempSrc: Oral Oral Oral Oral  SpO2: 92% 94% 97% 97%  Weight:   86 kg (189 lb 11.2 oz)   Height:        Intake/Output Summary (Last 24 hours) at 07/16/16 1206 Last data filed at 07/16/16 1017  Gross per 24 hour  Intake            531.9 ml  Output               75 ml  Net            456.9 ml   Filed Weights   07/14/16 0647 07/15/16 0633 07/16/16 0401  Weight: 87.1 kg (192 lb) 87.3 kg (192 lb 8 oz) 86 kg (189 lb 11.2 oz)     Physical Examination:  General exam: Appears calm and comfortable. Respiratory system: Clear to auscultation. Respiratory effort normal. Cardiovascular system:  RRR. No  murmurs, rubs, gallops. No pedal edema. GI system: Abdomen is nondistended, soft and nontender. No organomegaly.  Central nervous system. No focal neurological deficits. 5 x 5 power in all extremities. Psychiatry: Alert, oriented x 3.Judgement and insight appear normal. Affect normal.    Data Reviewed: I have personally reviewed following labs and imaging studies  CBG:  Recent Labs Lab 07/15/16 0645 07/15/16 1106 07/15/16 1625 07/15/16 2135 07/16/16 0611  GLUCAP 157* 146* 168* 151* 120*    CBC:  Recent Labs Lab 07/12/16 0654 07/13/16 0352 07/14/16 0450 07/15/16 0500 07/16/16 0530  WBC 11.4* 14.3* 11.9* 12.7* 13.2*  HGB 12.0* 11.6* 12.2* 11.8* 11.2*  HCT 38.1* 35.6* 37.9* 36.6* 35.7*  MCV 88.2 87.7 88.3 88.4 89.0  PLT 235 288 254 275 [id number]    Basic Metabolic Panel:  Recent Labs Lab 07/10/16 0420  07/11/16 0549 07/12/16 0655 07/14/16 0450 07/15/16 0500  NA 135 135 134* 133* 132*  K 4.5 4.1 4.2 4.4 4.7  CL 94* 94* 91* 92* 92*  CO2 32 35* 33* 34* 32  GLUCOSE 114* 79 161* 144* 139*  BUN 37* 33* 37* 40* 37*  CREATININE 1.09 1.13 1.29* 1.52* 1.31*  CALCIUM 10.0 10.2 10.3 10.3 10.5*    Recent Results (from the past 240 hour(s))  Surgical PCR screen     Status: None   Collection Time: 07/16/16  6:27 AM  Result Value Ref Range Status   MRSA, PCR NEGATIVE NEGATIVE Final   Staphylococcus aureus NEGATIVE NEGATIVE Final    Comment:        The Xpert SA Assay (FDA approved for NASAL specimens in patients over 28 years of age), is one component of a comprehensive surveillance  program.  Test performance has been validated by [facility] for patients greater than or equal to 3 year old. It is not intended to diagnose infection nor to guide or monitor treatment.        Recent Labs  07/03/16 1540  BNP 178.7*    ProBNP (last 3 results) No results for input(s): PROBNP in the last 8760 hours.    Studies: No results found.  Scheduled Meds: . [MAR Hold]  ceFAZolin (ANCEF) IV  2 g Intravenous Q8H  . [MAR Hold] collagenase   Topical Daily  . [MAR Hold] donepezil  10 mg Oral QHS  . [MAR Hold] ezetimibe-simvastatin  1 tablet Oral QHS  . [MAR Hold] feeding supplement (ENSURE ENLIVE)  237 mL Oral Q24H  . [MAR Hold] fluconazole  100 mg Oral Daily  . [MAR Hold] furosemide  40 mg Oral BID  . [MAR Hold] insulin aspart  0-9 Units Subcutaneous TID WC  . [MAR Hold] insulin glargine  12 Units Subcutaneous QHS  . [MAR Hold] multivitamin with minerals  1 tablet Oral Daily  . [MAR Hold] nystatin   Topical TID  . [MAR Hold] protein supplement shake  11 oz Oral Q24H  . [MAR Hold] sodium chloride flush  3 mL Intravenous Q12H      Time spent: 25 min  [facility] Pager [phone]. If 7PM-7AM, please contact night-coverage at [email or web address], Office   [phone]  password [email or web address] 07/16/2016, 12:06 PM  LOS: 13 days

## 2016-07-16 NOTE — Transfer of Care (Signed)
Immediate Anesthesia Transfer of Care Note  Patient: [name].  Procedure(s) Performed: Procedure(s): left lower extremity angiogram (N/A) RECECTION OF LEFT GREAT TOE METATARSAL HEAD and sesamoid (Left) Angioplasty and ATHERECTOMY of left anterior tibial artery (Left) drug coated Balloon ANGIOPLASTY of left superficial femoral artry stent (Left) ULTRASOUND GUIDANCE FOR VASCULAR ACCESS of right common femoral and left dorsalis pedis (Bilateral)  Patient Location: PACU  Anesthesia Type:General  Level of Consciousness: awake, alert , oriented and patient cooperative  Airway & Oxygen Therapy: Patient Spontanous Breathing and Patient connected to face mask oxygen  Post-op Assessment: Report given to RN and Post -op Vital signs reviewed and stable  Post vital signs: Reviewed and stable  Last Vitals:  Vitals:   07/16/16 0401 07/16/16 0754  BP: (!) 110/51 (!) 124/54  Pulse: 62 65  Resp: 18 20  Temp: 36.9 C 36.4 C    Last Pain:  Vitals:   07/16/16 0754  TempSrc: Oral  PainSc:          Complications: No apparent anesthesia complications

## 2016-07-16 NOTE — Progress Notes (Signed)
ANTICOAGULATION CONSULT NOTE  Pharmacy Consult:  Heparin Indication:  History of Afib  Allergies  Allergen Reactions  . Potassium-Containing Compounds Anaphylaxis    IV--loss of conciousness  . Metoprolol Succinate Other (See Comments)    Extremely tired    Patient Measurements: Height: 5\' 10"  (177.8 cm) Weight: 189 lb 11.2 oz (86 kg) (scale b) IBW/kg (Calculated) : 73 Heparin Dosing Weight: 88 kg  Vital Signs: Temp: 98.5 F (36.9 C) (01/24 0401) Temp Source: Oral (01/24 0401) BP: 110/51 (01/24 0401) Pulse Rate: 62 (01/24 0401)  Labs:  Recent Labs  07/14/16 0450 07/15/16 0500 07/15/16 0721 07/16/16 0530  HGB 12.2* 11.8*  --  11.2*  HCT 37.9* 36.6*  --  35.7*  PLT 254 275  --  262  LABPROT 15.1  --  14.5 16.2*  INR 1.19  --  1.12 1.29  HEPARINUNFRC 0.57  --  0.57 0.91*  CREATININE 1.52* 1.31*  --   --     Estimated Creatinine Clearance: 47.2 mL/min (by C-G formula based on SCr of 1.31 mg/dL (H)).  Assessment: 50 YOM on Coumadin PTA for history of Afib.  He also has a history of bioprosthetic AVR and LAA thrombus.  Coumadin has been on hold since admission and bridged with IV heparin since 07/07/16.  He is now s/p angiogram on 07/10/16 and the current plan is for amputation next week.  Heparin level now up to supratherapeutic (0.91) on gtt at 1700 units/hr. No bleeding noted.   Goal of Therapy:  Heparin level 0.3-0.7 units/ml Monitor platelets by anticoagulation protocol: Yes   Plan:  Decrease heparin to 1500 units/hr. RN to verify with MD when heparin to be turned off Noted pt to go to OR today - scheduled for 1115 - will f/u post [place], PharmD, BCPS Clinical pharmacist, pager [phone] 07/16/2016 6:44 AM

## 2016-07-17 LAB — CBC
HCT: 31.7 % — ABNORMAL LOW (ref 39.0–52.0)
Hemoglobin: 10.1 g/dL — ABNORMAL LOW (ref 13.0–17.0)
MCH: 28.6 pg (ref 26.0–34.0)
MCHC: 31.9 g/dL (ref 30.0–36.0)
MCV: 89.8 fL (ref 78.0–100.0)
PLATELETS: 205 10*3/uL (ref 150–400)
RBC: 3.53 MIL/uL — ABNORMAL LOW (ref 4.22–5.81)
RDW: 15.5 % (ref 11.5–15.5)
WBC: 14.1 10*3/uL — AB (ref 4.0–10.5)

## 2016-07-17 LAB — BASIC METABOLIC PANEL
Anion gap: 8 (ref 5–15)
BUN: 37 mg/dL — AB (ref 6–20)
CHLORIDE: 97 mmol/L — AB (ref 101–111)
CO2: 30 mmol/L (ref 22–32)
CREATININE: 1.46 mg/dL — AB (ref 0.61–1.24)
Calcium: 9.7 mg/dL (ref 8.9–10.3)
GFR calc Af Amer: 51 mL/min — ABNORMAL LOW (ref >60)
GFR calc non Af Amer: 44 mL/min — ABNORMAL LOW (ref >60)
Glucose, Bld: 148 mg/dL — ABNORMAL HIGH (ref 65–99)
Potassium: 4.7 mmol/L (ref 3.5–5.1)
SODIUM: 135 mmol/L (ref 135–145)

## 2016-07-17 LAB — GLUCOSE, CAPILLARY
GLUCOSE-CAPILLARY: 137 mg/dL — AB (ref 65–99)
GLUCOSE-CAPILLARY: 96 mg/dL (ref 65–99)
Glucose-Capillary: 168 mg/dL — ABNORMAL HIGH (ref 65–99)
Glucose-Capillary: 119 mg/dL — ABNORMAL HIGH (ref 65–99)
Glucose-Capillary: 191 mg/dL — ABNORMAL HIGH (ref 65–99)
Glucose-Capillary: 148 mg/dL — ABNORMAL HIGH (ref 65–99)

## 2016-07-17 LAB — MAGNESIUM: MAGNESIUM: 1.8 mg/dL (ref 1.7–2.4)

## 2016-07-17 LAB — HEPARIN LEVEL (UNFRACTIONATED): Heparin Unfractionated: 0.46 IU/mL (ref 0.30–0.70)

## 2016-07-17 LAB — PROTIME-INR
INR: 1.45
Prothrombin Time: 17.8 seconds — ABNORMAL HIGH (ref 11.4–15.2)

## 2016-07-17 LAB — POCT ACTIVATED CLOTTING TIME: Activated Clotting Time: 164 seconds

## 2016-07-17 MED ORDER — SODIUM CHLORIDE 0.9 % IV SOLN: 250.0000 mL | INTRAVENOUS | Status: DC | PRN

## 2016-07-17 MED ORDER — MAGNESIUM SULFATE 2 GM/50ML IV SOLN
2.0000 g | Freq: Once | INTRAVENOUS | Status: AC
  Administered 2016-07-17: 2 g via INTRAVENOUS
  Filled 2016-07-17 (×2): qty 50

## 2016-07-17 MED FILL — Medication: TOPICAL | Qty: 1 | Status: AC
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
## 2016-07-17 NOTE — Progress Notes (Signed)
ANTICOAGULATION CONSULT NOTE  Pharmacy Consult:  Heparin Indication:  History of Afib  Allergies  Allergen Reactions  . Potassium-Containing Compounds Anaphylaxis    IV--loss of conciousness  . Metoprolol Succinate Other (See Comments)    Extremely tired    Patient Measurements: Height: 5\' 10"  (177.8 cm) Weight: 191 lb 8 oz (86.9 kg) IBW/kg (Calculated) : 73 Heparin Dosing Weight: 88 kg  Vital Signs: Temp: 97.8 F (36.6 C) (01/25 0521) Temp Source: Oral (01/25 0521) BP: 114/47 (01/25 0521) Pulse Rate: 59 (01/25 0521)  Labs:  Recent Labs  07/15/16 0500 07/15/16 0721 07/16/16 0530 07/17/16 0339  HGB 11.8*  --  11.2* 10.1*  HCT 36.6*  --  35.7* 31.7*  PLT 275  --  262 205  LABPROT  --  14.5 16.2* 17.8*  INR  --  1.12 1.29 1.45  HEPARINUNFRC  --  0.57 0.91* 0.46  CREATININE 1.31*  --   --  1.46*    Estimated Creatinine Clearance: 42.4 mL/min (by C-G formula based on SCr of 1.46 mg/dL (H)).  Assessment: 35 YOM on Coumadin PTA for history of Afib, also with history of bioprosthetic AVR and LAA thrombus. Coumadin has been on hold since admission and bridged with IV heparin since 07/07/16. S/p angiogram on 07/10/16 and now s/p OR 1/24 for angiogram and debridement of 1st metatarsal on left.  Pharmacy consulted to resume heparin post-op last night, previously running at 1500 units/h. CBC stable, No bleeding noted.   This morning's heparin level is therapeutic at 0.46 on heparin 1500 units/hr. No issues with infusion or bleeding noted.  Goal of Therapy:  Heparin level 0.3-0.7 units/ml Monitor platelets by anticoagulation protocol: Yes   Plan:  Continue heparin 1500 units/h  Daily heparin level/CBC Monitor for s/sx bleeding Warfarin on hold F/u Vascular plans   Andrey Cota. Diona Foley, PharmD, BCPS Clinical Pharmacist [phone] 07/17/2016 8:13 AM

## 2016-07-17 NOTE — Progress Notes (Signed)
PT Cancellation Note  Patient Details Name: [name]. MRN: [name] DOB: [birth year]   Cancelled Treatment:    Reason Eval/Treat Not Completed: Other (comment). Waiting for Darco shoe for pt.   Shary Decamp Maycok 07/17/2016, 2:35 PM [employer] PT [phone]

## 2016-07-17 NOTE — Progress Notes (Signed)
Pt refused to eat lunch upon delivery. Delayed consumption of food delayed insulin coverage for lunch.   Due to sips of OJ recheck was elevated, coverage provided per recheck level. Likely pt will not consume dinner on time, therefore scheduling should not pose an issue with next insulin dose.

## 2016-07-17 NOTE — Progress Notes (Signed)
Dressing changed to left foot, post-op site. Sutures look great, incision is approximated, dried drainage, no new drainage noted.  Per surgical team this am, telfa applied (dry) with wrap. Ace wrap is at bedside, gauze/kerlex was applied for now as lighter weight option.   Pt denies complaints.   Just prior to dressing change, [facility] [employer] called reporting pt has no order for telemetry, then a second time to report a 7beat run of Vtach.   Upon entering room, pt had removed his nasal cannula; was re-applied at 3l, pt reposition in bed as he had slouched down. Prior to repositioning pts saturations were running in the 70s at initial placement of pulse-ox. pts pressure was consistent, but petite in his sleeping state.   Vitals rechecked as pt was no longer hypoxic and was repositioned, and all were WNL.   118/53, 93% on 3L via nasal cannula, HR 61.   Page sent to MD regarding lack of telemetry order; and earlier questioning by APS/[place] due to filed complaint/concern by friend/family/non-medical personnel  "3e28: Ayesha Rumpf.  no order for telemetry, on telemetry, 7beatVtach. also just FYI-APS now involved w this pt."

## 2016-07-17 NOTE — Progress Notes (Signed)
[facility]  PROGRESS NOTE  [name]. [id number] DOB: 12-19-1934 DOA: 07/03/2016 PCP: [name], MD   Brief HPI:    82 y.o.malewith PMH of Dementia, poorly controlled DM, severe AS status post redo aVR (bioprosthetic valve), chronic diastolic heart failure, CAD status post CABG, chronic valvular atrial fibrillation on Coumadin, history of left atrial thrombus noted on TEE in 7/12, history of peripheral vascular disease s/p PTCA of R tibial artery and Left SFA stent placement, amputation of L great toe and subsequent left foot ulcer with osteomyelitis on Keflex was referred from the infectious disease MDs office for evaluation of hypoxia. Found to have acute on chronic diastolic heart failure and started on diuresis. Infectious disease and vascular surgery is following for chronic osteomyelitis, awaiting left first MT amputation per vascular surgery.   Subjective   Patient denies pain, s/p resection 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 head and sesamoid bone.   Assessment/Plan:     1. Acute hypoxic respiratory failure-  Resolved, secondary to diastolic CHF, EF 0000000, severe pulmonary  hypertension at 83 mmHg. Patient was started on diuretics and initially responded to Lasix. On 07/09/2016 patient's exam was consistent with recurrence of effusion, chest x-ray confirmed recurrence of moderate sized right pleural effusion. IV Lasix was restarted 3 times a day. On 07/14/2016 chest x-ray showed improvement and IV Lasix was transitioned to oral Lasix. We'll continue with diuresis with Lasix 40 mg twice a day. Follow BMP in a.m. 2. Right pleural effusion/status post thoracentesis- patient underwent thoracentesis on 07/04/2016 with removal of 1.8 L of yellow fluid. Lites criteria fluid is transudate. Repeat chest x-ray showed improvement. Will continue  diuresis with Lasix. 3. Diabetic foot/osteomyelitis  of left first metatarsal- ID and vascular surgery was consulted. He underwent angiogram on  07/10/16. Plan is for angiogram of left lower extremity with possible distal excess and debridement of first metatarsal of left foot. Continue IV cefazolin. 4. Candida dermatitis of the groin- continue fluconazole, nystatin powder 5. Chronic atrial fibrillation- CHA2DS2VASc score is 6, not on rate controlling agents. Coumadin is on hold and started on IV heparin for bridging before surgery. 6. CAD, status post CABG- patient not on aspirin, continue statin. Has permanent pacemaker for complete heart block. 7. History of atrial thrombus- patient on Coumadin at home, switched to heparin for bridging. . 8. Status post aVR- patient has bioprosthetic aortic valve. 9. Diabetes mellitus- continue Lantus, sliding scale insulin. 10. Dementia- patient has very poor short-term memory, no behavior disturbance.    DVT prophylaxis: On IV heparin  Code Status: Full code  Family Communication: No family present at bedside   Disposition Plan: Pending outcome of surgery   Consultants:  Vascular surgery  infectious disease  Procedures:  Thoracentesis  Continuous infusions . heparin 1,500 Units/hr (07/16/16 2000)  . lactated ringers 50 mL/hr at 07/17/16 0059      Antibiotics:   Anti-infectives    Start     Dose/Rate Route Frequency Ordered Stop   07/14/16 0600  ceFAZolin (ANCEF) IVPB 1 g/50 mL premix  Status:  Discontinued    Comments:  Send with pt to OR   1 g 100 mL/hr over 30 Minutes Intravenous To Short Stay 07/11/16 0820 07/11/16 1139   07/11/16 1200  ceFAZolin (ANCEF) IVPB 2g/100 mL premix     2 g 200 mL/hr over 30 Minutes Intravenous Every 8 hours 07/11/16 1138     07/05/16 1330  fluconazole (DIFLUCAN) tablet 100 mg     100 mg Oral Daily  07/05/16 1231     07/04/16 2200  vancomycin (VANCOCIN) 1,750 mg in sodium chloride 0.9 % 500 mL IVPB  Status:  Discontinued     1,750 mg 250 mL/hr over 120 Minutes Intravenous Every 24 hours 07/04/16 2132 07/05/16 1231   07/03/16 1930   cephALEXin (KEFLEX) capsule 500 mg  Status:  Discontinued     500 mg Oral Every 8 hours 07/03/16 1835 07/05/16 1458       Objective   Vitals:   07/16/16 1900 07/16/16 2006 07/17/16 0046 07/17/16 0521  BP: (!) 113/43 (!) 119/47 (!) 112/43 (!) 114/47  Pulse: (!) 59 60 60 (!) 59  Resp:  20 19 20   Temp:  98 F (36.7 C) 98.2 F (36.8 C) 97.8 F (36.6 C)  TempSrc:  Oral Oral Oral  SpO2:  97% 98% 98%  Weight:    86.9 kg (191 lb 8 oz)  Height:        Intake/Output Summary (Last 24 hours) at 07/17/16 1041 Last data filed at 07/17/16 0525  Gross per 24 hour  Intake           1984.3 ml  Output             1200 ml  Net            784.3 ml   Filed Weights   07/15/16 0633 07/16/16 0401 07/17/16 0521  Weight: 87.3 kg (192 lb 8 oz) 86 kg (189 lb 11.2 oz) 86.9 kg (191 lb 8 oz)     Physical Examination:  General exam: Appears calm and comfortable. Respiratory system: Clear to auscultation. Respiratory effort normal. Cardiovascular system:  RRR. No  murmurs, rubs, gallops. No pedal edema. GI system: Abdomen is nondistended, soft and nontender. No organomegaly.  Central nervous system. No focal neurological deficits. 5 x 5 power in all extremities. Psychiatry: Alert, oriented x 3.Judgement and insight appear normal. Affect normal. Foot,  left - post op changes, s/p resection of first metatarsal. Wound looks clean.    Data Reviewed: I have personally reviewed following labs and imaging studies  CBG:  Recent Labs Lab 07/15/16 2135 07/16/16 0611 07/16/16 1656 07/16/16 2230 07/17/16 0629  GLUCAP 151* 120* 149* 168* 119*    CBC:  Recent Labs Lab 07/13/16 0352 07/14/16 0450 07/15/16 0500 07/16/16 0530 07/17/16 0339  WBC 14.3* 11.9* 12.7* 13.2* 14.1*  HGB 11.6* 12.2* 11.8* 11.2* 10.1*  HCT 35.6* 37.9* 36.6* 35.7* 31.7*  MCV 87.7 88.3 88.4 89.0 89.8  PLT 288 254 275 262 [id number]    Basic Metabolic Panel:  Recent Labs Lab 07/11/16 0549 07/12/16 0655 07/14/16 0450  07/15/16 0500 07/17/16 0339  NA 135 134* 133* 132* 135  K 4.1 4.2 4.4 4.7 4.7  CL 94* 91* 92* 92* 97*  CO2 35* 33* 34* 32 30  GLUCOSE 79 161* 144* 139* 148*  BUN 33* 37* 40* 37* 37*  CREATININE 1.13 1.29* 1.52* 1.31* 1.46*  CALCIUM 10.2 10.3 10.3 10.5* 9.7    Recent Results (from the past 240 hour(s))  Surgical PCR screen     Status: None   Collection Time: 07/16/16  6:27 AM  Result Value Ref Range Status   MRSA, PCR NEGATIVE NEGATIVE Final   Staphylococcus aureus NEGATIVE NEGATIVE Final    Comment:        The Xpert SA Assay (FDA approved for NASAL specimens in patients over 31 years of age), is one component of a comprehensive surveillance program.  Test performance has been validated  by [facility] for patients greater than or equal to 28 year old. It is not intended to diagnose infection nor to guide or monitor treatment.        Recent Labs  07/03/16 1540  BNP 178.7*    ProBNP (last 3 results) No results for input(s): PROBNP in the last 8760 hours.    Studies: No results found.  Scheduled Meds: . aspirin EC  81 mg Oral Daily  .  ceFAZolin (ANCEF) IV  2 g Intravenous Q8H  . donepezil  10 mg Oral QHS  . ezetimibe-simvastatin  1 tablet Oral QHS  . feeding supplement (ENSURE ENLIVE)  237 mL Oral Q24H  . fluconazole  100 mg Oral Daily  . furosemide  40 mg Oral BID  . insulin aspart  0-9 Units Subcutaneous TID WC  . insulin glargine  12 Units Subcutaneous QHS  . multivitamin with minerals  1 tablet Oral Daily  . nystatin   Topical TID  . protein supplement shake  11 oz Oral Q24H  . sodium chloride flush  3 mL Intravenous Q12H      Time spent: 25 min  [facility] Pager [phone]. If 7PM-7AM, please contact night-coverage at [email or web address], Office  [phone]  password [email or web address] 07/17/2016, 10:41 AM  LOS: 14 days

## 2016-07-17 NOTE — Progress Notes (Signed)
Orthopedic Tech Progress Note Patient Details:  [name] 04-18-1935 [id number]  Ortho Devices Type of Ortho Device: Darco shoe Ortho Device/Splint Location: LLE Ortho Device/Splint Interventions: Ordered, Application   Braulio Bosch 07/17/2016, 3:22 PM

## 2016-07-17 NOTE — Progress Notes (Addendum)
Progress Note    07/17/2016 8:24 AM 1 Day Post-Op  Subjective:  No complaints  Afebrile   Vitals:   07/17/16 0046 07/17/16 0521  BP: (!) 112/43 (!) 114/47  Pulse: 60 (!) 59  Resp: 19 20  Temp: 98.2 F (36.8 C) 97.8 F (36.6 C)    Physical Exam: Cardiac:  regular Lungs:  Non labored Incisions:  Clean with mild bloody drainage on bandage with some erythema of the dorsum of foot. Extremities:  Brisk AT doppler signal left   CBC    Component Value Date/Time   WBC 14.1 (H) 07/17/2016 0339   RBC 3.53 (L) 07/17/2016 0339   HGB 10.1 (L) 07/17/2016 0339   HGB 14.1 11/22/2010 0953   HCT 31.7 (L) 07/17/2016 0339   HCT 42.6 11/22/2010 0953   PLT 205 07/17/2016 0339   PLT 223 11/22/2010 0953   MCV 89.8 07/17/2016 0339   MCV 82 11/22/2010 0953   MCH 28.6 07/17/2016 0339   MCHC 31.9 07/17/2016 0339   RDW 15.5 07/17/2016 0339   RDW 16.3 (H) 11/22/2010 0953   LYMPHSABS 2.1 07/06/2016 0433   LYMPHSABS 2.4 11/22/2010 0953   MONOABS 1.0 07/06/2016 0433   EOSABS 0.2 07/06/2016 0433   EOSABS 0.4 11/22/2010 0953   BASOSABS 0.0 07/06/2016 0433   BASOSABS 0.1 11/22/2010 0953    BMET    Component Value Date/Time   NA 135 07/17/2016 0339   K 4.7 07/17/2016 0339   CL 97 (L) 07/17/2016 0339   CO2 30 07/17/2016 0339   GLUCOSE 148 (H) 07/17/2016 0339   GLUCOSE 229 08/07/2008 0000   BUN 37 (H) 07/17/2016 0339   CREATININE 1.46 (H) 07/17/2016 0339   CREATININE 1.17 03/19/2016 1534   CALCIUM 9.7 07/17/2016 0339   GFRNONAA 44 (L) 07/17/2016 0339   GFRAA 51 (L) 07/17/2016 0339    INR    Component Value Date/Time   INR 1.45 07/17/2016 0339   INR 1.7 09/02/2010 1323     Intake/Output Summary (Last 24 hours) at 07/17/16 0824 Last data filed at 07/17/16 0525  Gross per 24 hour  Intake          2010.55 ml  Output             1200 ml  Net           810.55 ml     Assessment:  82 y.o. male is s/p:  1.  US guided cannulation of right common femoral artery 2.  Left  lower extremity angiogram 3.  US guided cannulation of left anterior tibial artery 4.  CSI atherectomy of left anterior tibial artery 5.  Balloon angioplasty of left anterior tibial artery with 44mm balloon 6.  Drug coated balloon angioplasty of left sfa in-stent restenosis with 33mm impact admiral 7.  Resection 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 head and sesmoid bone  1 Day Post-Op  Plan: -pt doing well this morning -brisk left AT doppler signal present -creatinine up but still less than 3 days ago-continue to monitor (70cc IV contrast given yesterday) -incision looks good with sutures in place-mild bloody drainage; continue dry dressing -DVT prophylaxis:  Heparin gtt   Leontine Locket, PA-C [facility] [phone] 07/17/2016 8:24 AM   I have independently interviewed patient and agree with PA assessment and plan above. Needs darko shoe and is ok to ambulated with the shoe in place. Aspirin ordered. Ok to transition back to coumadin and start d/c planning to rehab.  Ahriana Gunkel C. Donzetta Matters, MD  [facility] Office: [phone] Pager: [phone]

## 2016-07-18 ENCOUNTER — Encounter (HOSPITAL_COMMUNITY): Payer: Self-pay | Admitting: Vascular Surgery

## 2016-07-18 LAB — CBC
HCT: 31 % — ABNORMAL LOW (ref 39.0–52.0)
HEMOGLOBIN: 9.7 g/dL — AB (ref 13.0–17.0)
MCH: 28.2 pg (ref 26.0–34.0)
MCHC: 31.3 g/dL (ref 30.0–36.0)
MCV: 90.1 fL (ref 78.0–100.0)
Platelets: 232 10*3/uL (ref 150–400)
RBC: 3.44 MIL/uL — AB (ref 4.22–5.81)
RDW: 15.7 % — ABNORMAL HIGH (ref 11.5–15.5)
WBC: 11.9 10*3/uL — AB (ref 4.0–10.5)

## 2016-07-18 LAB — GLUCOSE, CAPILLARY
GLUCOSE-CAPILLARY: 135 mg/dL — AB (ref 65–99)
GLUCOSE-CAPILLARY: 210 mg/dL — AB (ref 65–99)
GLUCOSE-CAPILLARY: 166 mg/dL — AB (ref 65–99)
Glucose-Capillary: 128 mg/dL — ABNORMAL HIGH (ref 65–99)

## 2016-07-18 LAB — HEPARIN LEVEL (UNFRACTIONATED): HEPARIN UNFRACTIONATED: 0.46 [IU]/mL (ref 0.30–0.70)

## 2016-07-18 LAB — PROTIME-INR
INR: 1.3
Prothrombin Time: 16.3 seconds — ABNORMAL HIGH (ref 11.4–15.2)

## 2016-07-18 MED ORDER — WARFARIN - PHARMACIST DOSING INPATIENT
Freq: Every day | Status: DC
  Administered 2016-07-18 – 2016-07-21 (×4)

## 2016-07-18 MED ORDER — WARFARIN SODIUM 7.5 MG PO TABS
7.5000 mg | ORAL_TABLET | Freq: Once | ORAL | Status: AC
  Administered 2016-07-18: 7.5 mg via ORAL
  Filled 2016-07-18: qty 1

## 2016-07-18 NOTE — Progress Notes (Signed)
  Progress Note    07/18/2016 8:48 AM 2 Days Post-Op  Subjective:  No acute issues  Vitals:   07/17/16 2100 07/18/16 0506  BP: 109/65 (!) 118/47  Pulse: 60 61  Resp: 20 19  Temp: 98.5 F (36.9 C) 98.3 F (36.8 C)    Physical Exam: Awake and alert Doppler does not work so cannot effectively check signals Wound is cdi with evidence fo bleeding  CBC    Component Value Date/Time   WBC 11.9 (H) 07/18/2016 0357   RBC 3.44 (L) 07/18/2016 0357   HGB 9.7 (L) 07/18/2016 0357   HGB 14.1 11/22/2010 0953   HCT 31.0 (L) 07/18/2016 0357   HCT 42.6 11/22/2010 0953   PLT 232 07/18/2016 0357   PLT 223 11/22/2010 0953   MCV 90.1 07/18/2016 0357   MCV 82 11/22/2010 0953   MCH 28.2 07/18/2016 0357   MCHC 31.3 07/18/2016 0357   RDW 15.7 (H) 07/18/2016 0357   RDW 16.3 (H) 11/22/2010 0953   LYMPHSABS 2.1 07/06/2016 0433   LYMPHSABS 2.4 11/22/2010 0953   MONOABS 1.0 07/06/2016 0433   EOSABS 0.2 07/06/2016 0433   EOSABS 0.4 11/22/2010 0953   BASOSABS 0.0 07/06/2016 0433   BASOSABS 0.1 11/22/2010 0953    BMET    Component Value Date/Time   NA 135 07/17/2016 0339   K 4.7 07/17/2016 0339   CL 97 (L) 07/17/2016 0339   CO2 30 07/17/2016 0339   GLUCOSE 148 (H) 07/17/2016 0339   GLUCOSE 229 08/07/2008 0000   BUN 37 (H) 07/17/2016 0339   CREATININE 1.46 (H) 07/17/2016 0339   CREATININE 1.17 03/19/2016 1534   CALCIUM 9.7 07/17/2016 0339   GFRNONAA 44 (L) 07/17/2016 0339   GFRAA 51 (L) 07/17/2016 0339    INR    Component Value Date/Time   INR 1.30 07/18/2016 0357   INR 1.7 09/02/2010 1323     Intake/Output Summary (Last 24 hours) at 07/18/16 0848 Last data filed at 07/18/16 0725  Gross per 24 hour  Intake          1361.25 ml  Output             1350 ml  Net            11.25 ml     Assessment:  82 y.o. male is s/p revasculariztion of left at artery and debridement of 1st metatarsal  Plan: Continue aspirin Ok to transition to coumadin Physical therapy with post op  shoe Will need rehab on d/c   Iszabella Hebenstreit C. Donzetta Matters, MD [facility] [place] Office: [phone] Pager: [phone]  07/18/2016 8:48 AM

## 2016-07-18 NOTE — Clinical Social Work Placement (Signed)
   CLINICAL SOCIAL WORK PLACEMENT  NOTE  Date:  07/18/2016  Patient Details  Name: [name]. MRN: [id number] Date of Birth: 08/12/1934  Clinical Social Work is seeking post-discharge placement for this patient at the [place] level of care (*CSW will initial, date and re-position this form in  chart as items are completed):  Yes   Patient/family provided with [place] Work Department's list of facilities offering this level of care within the geographic area requested by the patient (or if unable, by the patient's family).  Yes   Patient/family informed of their freedom to choose among providers that offer the needed level of care, that participate in Medicare, Medicaid or managed care program needed by the patient, have an available bed and are willing to accept the patient.  Yes   Patient/family informed of [facility]'s ownership interest in [facility] and [facility], as well as of the fact that they are under no obligation to receive care at these facilities.  PASRR submitted to EDS on 07/18/16     PASRR number received on       Existing PASRR number confirmed on 07/18/16     FL2 transmitted to all facilities in geographic area requested by pt/family on 07/18/16     FL2 transmitted to all facilities within larger geographic area on       Patient informed that his/her managed care company has contracts with or will negotiate with certain facilities, including the following:            Patient/family informed of bed offers received.  Patient chooses bed at       Physician recommends and patient chooses bed at      Patient to be transferred to   on  .  Patient to be transferred to facility by       Patient family notified on   of transfer.  Name of family member notified:        PHYSICIAN Please sign FL2     Additional Comment:    _______________________________________________ Candie Chroman, LCSW 07/18/2016,  12:19 PM

## 2016-07-18 NOTE — Progress Notes (Signed)
Physical Therapy Treatment Patient Details Name: [name]. MRN: [name] DOB: [birth year] Today's Date: 07/18/2016    History of Present Illness Pt adm with Acute hypoxemic respiratory failure: Probably secondary to acute diastolic heart failure or pleural effusion. Pt also with osteomyelitis of lt foot and underwent resection of 1st metatarsal head and sesmoid bone and angioplasty of LLE vasculature on 07/16/16. PMH - chf, avr, cabg, afib, pacer, lt toe amputations, pvd./    PT Comments    Pt re-assessed since surgery on 1/24. Pt now requiring assist with all mobility. Will now need ST-SNF due to decline in functional status after surgery.  Follow Up Recommendations  SNF     Equipment Recommendations  None recommended by PT    Recommendations for Other Services       Precautions / Restrictions Precautions Precautions: Fall Required Braces or Orthoses: Other Brace/Splint Other Brace/Splint: Darco on lt foot when up Restrictions Weight Bearing Restrictions: Yes LLE Weight Bearing: Weight bearing as tolerated Other Position/Activity Restrictions: Through left heel with Darco shoe    Mobility  Bed Mobility Overal bed mobility: Needs Assistance Bed Mobility: Supine to Sit     Supine to sit: Min assist;HOB elevated     General bed mobility comments: Assist to elevate trunk and bring hips to EOB.   Transfers Overall transfer level: Needs assistance Equipment used: Rolling walker (2 wheeled) Transfers: Sit to/from Stand Sit to Stand: Mod assist         General transfer comment: assist to bring hips up and for balance. Verbal cues for hand placement  Ambulation/Gait Ambulation/Gait assistance: Min assist;Mod assist   Assistive device: Rolling walker (2 wheeled) Gait Pattern/deviations: Step-through pattern;Drifts right/left;Wide base of support;Step-to pattern;Decreased step length - right;Decreased step length - left Gait velocity: decr Gait velocity  interpretation: Below normal speed for age/gender General Gait Details: assist for balance and support. Verbal cues to stay closer to the walker. Poor balance with Darco shoe.   Stairs            Wheelchair Mobility    Modified Rankin (Stroke Patients Only)       Balance Overall balance assessment: Needs assistance Sitting-balance support: Feet supported;Bilateral upper extremity supported Sitting balance-Leahy Scale: Poor Sitting balance - Comments: Pt requiring UE assist. Initial min A due to posterior lean Postural control: Posterior lean Standing balance support: Bilateral upper extremity supported Standing balance-Leahy Scale: Poor Standing balance comment: Walker and min A for static standing                    Cognition Arousal/Alertness: Awake/alert Behavior During Therapy: WFL for tasks assessed/performed Overall Cognitive Status: History of cognitive impairments - at baseline                      Exercises      General Comments        Pertinent Vitals/Pain      Home Living                      Prior Function            PT Goals (current goals can now be found in the care plan section) Acute Rehab PT Goals PT Goal Formulation: With patient Time For Goal Achievement: 07/25/16 Potential to Achieve Goals: Good Progress towards PT goals: Goals downgraded-see care plan    Frequency    Min 3X/week      PT Plan Discharge plan needs  to be updated    Co-evaluation             End of Session Equipment Utilized During Treatment: Oxygen Activity Tolerance: Patient limited by fatigue Patient left: with call bell/phone within reach;in chair;with chair alarm set (has O2 off when PT entered and left off as he is satting 91%)     Time: [id number] PT Time Calculation (min) (ACUTE ONLY): 33 min  Charges:  $Gait Training: 8-22 mins                    G CodesShary Decamp Maycok 2016-08-01, 10:14 AM [place]

## 2016-07-18 NOTE — Progress Notes (Addendum)
ANTICOAGULATION CONSULT NOTE  Pharmacy Consult:  Heparin and Warfarin Indication:  History of Afib  Allergies  Allergen Reactions  . Potassium-Containing Compounds Anaphylaxis    IV--loss of conciousness  . Metoprolol Succinate Other (See Comments)    Extremely tired    Patient Measurements: Height: 5\' 10"  (177.8 cm) Weight: 194 lb 3.2 oz (88.1 kg) (Scale B) IBW/kg (Calculated) : 73 Heparin Dosing Weight: 88 kg  Vital Signs: Temp: 98.3 F (36.8 C) (01/26 0506) Temp Source: Oral (01/26 0506) BP: 118/47 (01/26 0506) Pulse Rate: 61 (01/26 0506)  Labs:  Recent Labs  07/16/16 0530 07/17/16 0339 07/18/16 0357  HGB 11.2* 10.1* 9.7*  HCT 35.7* 31.7* 31.0*  PLT 262 205 232  LABPROT 16.2* 17.8* 16.3*  INR 1.29 1.45 1.30  HEPARINUNFRC 0.91* 0.46 0.46  CREATININE  --  1.46*  --     Estimated Creatinine Clearance: 45.8 mL/min (by C-G formula based on SCr of 1.46 mg/dL (H)).  Assessment: 81 YOM on Coumadin PTA for history of Afib, also with history of bioprosthetic AVR and LAA thrombus. Coumadin has been on hold since admission and bridged with IV heparin since 07/07/16. S/p angiogram on 07/10/16 and now s/p OR 1/24 for angiogram and debridement of 1st metatarsal on left.  Pharmacy consulted to resume heparin post-op last night, previously running at 1500 units/h. CBC stable, No bleeding noted.   This morning's heparin level is therapeutic at 0.46 on heparin 1500 units/hr. No issues with infusion or bleeding noted. Per vascular surgery note, it is ok to restart warfarin. INR is 1.3.  PTA Warfarin Dose: 7.5mg  Fri and 5mg  AODs  Goal of Therapy:  Heparin level 0.3-0.7 units/ml Monitor platelets by anticoagulation protocol: Yes   Plan:  Continue heparin 1500 units/h  Warfarin 7.5mg  tonight x1 Daily HL/INR/CBC Monitor for s/sx bleeding   Andrey Cota. Diona Foley, PharmD, BCPS Clinical Pharmacist [phone] 07/18/2016 7:46 AM
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
## 2016-07-18 NOTE — Progress Notes (Signed)
During 7am-7pm shift, patient had no compliants. Pleasantly confused. Declined to eat dinner this evening. Stated he was not hungry. Patient does continue to remove oxygen during the day. Oxygen continues to be replaced.

## 2016-07-18 NOTE — Progress Notes (Signed)
Pt had a 14 beat run of V-tach no s/s, MD notified, ordered Mg IV, will continue to monitor, Thanks Arvella Nigh RN.
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
## 2016-07-18 NOTE — Progress Notes (Addendum)
[facility]  PROGRESS NOTE  [name]. [id number] DOB: 1934/10/14 DOA: 07/03/2016 PCP: [name], MD   Brief HPI:    82 y.o.malewith PMH of Dementia, poorly controlled DM, severe AS status post redo aVR (bioprosthetic valve), chronic diastolic heart failure, CAD status post CABG, chronic valvular atrial fibrillation on Coumadin, history of left atrial thrombus noted on TEE in 7/12, history of peripheral vascular disease s/p PTCA of R tibial artery and Left SFA stent placement, amputation of L great toe and subsequent left foot ulcer with osteomyelitis on Keflex was referred from the infectious disease MDs office for evaluation of hypoxia. Found to have acute on chronic diastolic heart failure and started on diuresis. Infectious disease and vascular surgery is following for chronic osteomyelitis, awaiting left first MT amputation per vascular surgery.   Subjective   Patient denies pain, s/p resection 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 head and sesamoid bone.Denies chest pain,shortness of breath.   Assessment/Plan:     1. Acute hypoxic respiratory failure-  Resolved, secondary to diastolic CHF, EF 0000000, severe pulmonary  hypertension at 83 mmHg. Patient was started on diuretics and initially responded to Lasix. On 07/09/2016 patient's exam was consistent with recurrence of effusion, chest x-ray confirmed recurrence of moderate sized right pleural effusion. IV Lasix was restarted 3 times a day. On 07/14/2016 chest x-ray showed improvement and IV Lasix was transitioned to oral Lasix. We'll continue with diuresis with Lasix 40 mg twice a day. Follow BMP in a.m. 2. Right pleural effusion/status post thoracentesis- patient underwent thoracentesis on 07/04/2016 with removal of 1.8 L of yellow fluid. Lites criteria fluid is transudate. Repeat chest x-ray showed improvement. Will continue  diuresis with Lasix. 3. Diabetic foot/osteomyelitis  of left first metatarsal- ID and vascular surgery was  consulted. He underwent angiogram on 07/10/16. Plan is for angiogram of left lower extremity with possible distal excess and debridement of first metatarsal of left foot. Continue IV cefazolin. 4. Candida dermatitis of the groin- continue fluconazole, nystatin powder 5. Chronic atrial fibrillation- CHA2DS2VASc score is 6, not on rate controlling agents. Coumadin is on hold and started on IV heparin for bridging before surgery. 6. CAD, status post CABG- patient not on aspirin, continue statin. Has permanent pacemaker for complete heart block. 7. History of atrial thrombus- patient on Coumadin at home, switched to heparin for bridging. Will restart coumadin. 8. Status post aVR- patient has bioprosthetic aortic valve. 9. Diabetes mellitus- continue Lantus, sliding scale insulin. 10. Dementia- patient has very poor short-term memory, no behavior disturbance.    DVT prophylaxis: On IV heparin  Code Status: Full code  Family Communication: No family present at bedside   Disposition Plan: SNF   Consultants:  Vascular surgery  infectious disease  Procedures:  Thoracentesis  Continuous infusions . heparin 1,500 Units/hr (07/18/16 0442)      Antibiotics:   Anti-infectives    Start     Dose/Rate Route Frequency Ordered Stop   07/14/16 0600  ceFAZolin (ANCEF) IVPB 1 g/50 mL premix  Status:  Discontinued    Comments:  Send with pt to OR   1 g 100 mL/hr over 30 Minutes Intravenous To Short Stay 07/11/16 0820 07/11/16 1139   07/11/16 1200  ceFAZolin (ANCEF) IVPB 2g/100 mL premix     2 g 200 mL/hr over 30 Minutes Intravenous Every 8 hours 07/11/16 1138     07/05/16 1330  fluconazole (DIFLUCAN) tablet 100 mg     100 mg Oral Daily 07/05/16 1231  07/04/16 2200  vancomycin (VANCOCIN) 1,750 mg in sodium chloride 0.9 % 500 mL IVPB  Status:  Discontinued     1,750 mg 250 mL/hr over 120 Minutes Intravenous Every 24 hours 07/04/16 2132 07/05/16 1231   07/03/16 1930  cephALEXin (KEFLEX)  capsule 500 mg  Status:  Discontinued     500 mg Oral Every 8 hours 07/03/16 1835 07/05/16 1458       Objective   Vitals:   07/18/16 0506 07/18/16 0722 07/18/16 0725 07/18/16 0750  BP: (!) 118/47   118/68  Pulse: 61   61  Resp: 19   18  Temp: 98.3 F (36.8 C)   98.5 F (36.9 C)  TempSrc: Oral   Oral  SpO2: 95% (!) 68% 97% 96%  Weight: 88.1 kg (194 lb 3.2 oz)     Height:        Intake/Output Summary (Last 24 hours) at 07/18/16 1328 Last data filed at 07/18/16 1020  Gross per 24 hour  Intake          1364.25 ml  Output             1350 ml  Net            14.25 ml   Filed Weights   07/16/16 0401 07/17/16 0521 07/18/16 0506  Weight: 86 kg (189 lb 11.2 oz) 86.9 kg (191 lb 8 oz) 88.1 kg (194 lb 3.2 oz)     Physical Examination:  General exam: Appears calm and comfortable. Respiratory system: Clear to auscultation. Respiratory effort normal. Cardiovascular system:  RRR. No  murmurs, rubs, gallops. No pedal edema. GI system: Abdomen is nondistended, soft and nontender. No organomegaly.  Central nervous system. No focal neurological deficits. 5 x 5 power in all extremities. Psychiatry: Alert, oriented x 3.Judgement and insight appear normal. Affect normal. Foot,  left - post op changes, s/p resection of first metatarsal. Wound looks clean.    Data Reviewed: I have personally reviewed following labs and imaging studies  CBG:  Recent Labs Lab 07/17/16 1544 07/17/16 1806 07/17/16 2125 07/18/16 0540 07/18/16 1138  GLUCAP 191* 148* 96 128* 135*    CBC:  Recent Labs Lab 07/14/16 0450 07/15/16 0500 07/16/16 0530 07/17/16 0339 07/18/16 0357  WBC 11.9* 12.7* 13.2* 14.1* 11.9*  HGB 12.2* 11.8* 11.2* 10.1* 9.7*  HCT 37.9* 36.6* 35.7* 31.7* 31.0*  MCV 88.3 88.4 89.0 89.8 90.1  PLT 254 275 262 205 [id number]    Basic Metabolic Panel:  Recent Labs Lab 07/12/16 0655 07/14/16 0450 07/15/16 0500 07/17/16 0339 07/17/16 1241  NA 134* 133* 132* 135  --   K 4.2 4.4  4.7 4.7  --   CL 91* 92* 92* 97*  --   CO2 33* 34* 32 30  --   GLUCOSE 161* 144* 139* 148*  --   BUN 37* 40* 37* 37*  --   CREATININE 1.29* 1.52* 1.31* 1.46*  --   CALCIUM 10.3 10.3 10.5* 9.7  --   MG  --   --   --   --  1.8    Recent Results (from the past 240 hour(s))  Surgical PCR screen     Status: None   Collection Time: 07/16/16  6:27 AM  Result Value Ref Range Status   MRSA, PCR NEGATIVE NEGATIVE Final   Staphylococcus aureus NEGATIVE NEGATIVE Final    Comment:        The Xpert SA Assay (FDA approved for NASAL specimens in patients over 21  years of age), is one component of a comprehensive surveillance program.  Test performance has been validated by [facility] for patients greater than or equal to 54 year old. It is not intended to diagnose infection nor to guide or monitor treatment.        Recent Labs  07/03/16 1540  BNP 178.7*    ProBNP (last 3 results) No results for input(s): PROBNP in the last 8760 hours.    Studies: No results found.  Scheduled Meds: . aspirin EC  81 mg Oral Daily  .  ceFAZolin (ANCEF) IV  2 g Intravenous Q8H  . donepezil  10 mg Oral QHS  . ezetimibe-simvastatin  1 tablet Oral QHS  . feeding supplement (ENSURE ENLIVE)  237 mL Oral Q24H  . fluconazole  100 mg Oral Daily  . furosemide  40 mg Oral BID  . insulin aspart  0-9 Units Subcutaneous TID WC  . insulin glargine  12 Units Subcutaneous QHS  . multivitamin with minerals  1 tablet Oral Daily  . nystatin   Topical TID  . protein supplement shake  11 oz Oral Q24H  . sodium chloride flush  3 mL Intravenous Q12H  . warfarin  7.5 mg Oral ONCE-1800  . Warfarin - Pharmacist Dosing Inpatient   Does not apply q1800      Time spent: 25 min  [facility] Pager [phone]. If 7PM-7AM, please contact night-coverage at [email or web address], Office  [phone]  password [email or web address] 07/18/2016, 1:28 PM  LOS: 15 days

## 2016-07-18 NOTE — Progress Notes (Signed)
During this RN's morning assessment, patient's oxygen saturation was 68% on room air. Per report patient takes nasal cannula out periodically. Patient placed back on 3L [place]. Oxygen saturations went up to 97%.

## 2016-07-18 NOTE — Care Management Important Message (Signed)
Important Message  Patient Details  Name: [name]. MRN: [name] Date of Birth: 1935/02/24   Medicare Important Message Given:  Yes    Nathen May 07/18/2016, 2:18 PM

## 2016-07-19 LAB — BASIC METABOLIC PANEL
ANION GAP: 8 (ref 5–15)
BUN: 63 mg/dL — ABNORMAL HIGH (ref 6–20)
CALCIUM: 9.3 mg/dL (ref 8.9–10.3)
CO2: 29 mmol/L (ref 22–32)
Chloride: 96 mmol/L — ABNORMAL LOW (ref 101–111)
Creatinine, Ser: 1.34 mg/dL — ABNORMAL HIGH (ref 0.61–1.24)
GFR calc non Af Amer: 49 mL/min — ABNORMAL LOW (ref >60)
GFR, EST AFRICAN AMERICAN: 56 mL/min — AB (ref >60)
GLUCOSE: 141 mg/dL — AB (ref 65–99)
POTASSIUM: 4.5 mmol/L (ref 3.5–5.1)
Sodium: 133 mmol/L — ABNORMAL LOW (ref 135–145)

## 2016-07-19 LAB — CBC
HCT: 28.3 % — ABNORMAL LOW (ref 39.0–52.0)
HEMOGLOBIN: 8.8 g/dL — AB (ref 13.0–17.0)
MCH: 27.9 pg (ref 26.0–34.0)
MCHC: 31.1 g/dL (ref 30.0–36.0)
MCV: 89.8 fL (ref 78.0–100.0)
Platelets: 233 10*3/uL (ref 150–400)
RBC: 3.15 MIL/uL — AB (ref 4.22–5.81)
RDW: 15.8 % — ABNORMAL HIGH (ref 11.5–15.5)
WBC: 12.1 10*3/uL — AB (ref 4.0–10.5)

## 2016-07-19 LAB — GLUCOSE, CAPILLARY
GLUCOSE-CAPILLARY: 130 mg/dL — AB (ref 65–99)
Glucose-Capillary: 147 mg/dL — ABNORMAL HIGH (ref 65–99)
Glucose-Capillary: 176 mg/dL — ABNORMAL HIGH (ref 65–99)
Glucose-Capillary: 185 mg/dL — ABNORMAL HIGH (ref 65–99)

## 2016-07-19 LAB — PROTIME-INR
INR: 1.43
Prothrombin Time: 17.6 seconds — ABNORMAL HIGH (ref 11.4–15.2)

## 2016-07-19 LAB — HEPARIN LEVEL (UNFRACTIONATED): HEPARIN UNFRACTIONATED: 0.45 [IU]/mL (ref 0.30–0.70)

## 2016-07-19 MED ORDER — WARFARIN SODIUM 5 MG PO TABS
5.0000 mg | ORAL_TABLET | Freq: Once | ORAL | Status: AC
  Administered 2016-07-19: 5 mg via ORAL
  Filled 2016-07-19: qty 1

## 2016-07-19 NOTE — Progress Notes (Signed)
IV heparin continue @15 , No any complication of bleeding or anything like that, pt has no complain of pain,  tolerating diet and meds, will continue to monitor the patient.

## 2016-07-19 NOTE — Progress Notes (Signed)
Pt's oxygen sat on RA @83 %, so started via [place] at 3l/min

## 2016-07-19 NOTE — Progress Notes (Signed)
[facility]  PROGRESS NOTE  [name]. [id number] DOB: 10-27-1934 DOA: 07/03/2016 PCP: [name], MD   Brief HPI:    82 y.o.malewith PMH of Dementia, poorly controlled DM, severe AS status post redo aVR (bioprosthetic valve), chronic diastolic heart failure, CAD status post CABG, chronic valvular atrial fibrillation on Coumadin, history of left atrial thrombus noted on TEE in 7/12, history of peripheral vascular disease s/p PTCA of R tibial artery and Left SFA stent placement, amputation of L great toe and subsequent left foot ulcer with osteomyelitis on Keflex was referred from the infectious disease MDs office for evaluation of hypoxia. Found to have acute on chronic diastolic heart failure and started on diuresis. Infectious disease and vascular surgery is following for chronic osteomyelitis, awaiting left first MT amputation per vascular surgery.   Subjective   Patient denies pain, s/p resection 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 head and sesamoid bone.Denies chest pain,shortness of breath.   Assessment/Plan:     1. Acute hypoxic respiratory failure-  Resolved, secondary to diastolic CHF, EF 0000000, severe pulmonary  hypertension at 83 mmHg. Patient was started on diuretics and initially responded to Lasix. On 07/09/2016 patient's exam was consistent with recurrence of effusion, chest x-ray confirmed recurrence of moderate sized right pleural effusion. IV Lasix was restarted 3 times a day. On 07/14/2016 chest x-ray showed improvement and IV Lasix was transitioned to oral Lasix. We'll continue with diuresis with Lasix 40 mg twice a day.  2. Right pleural effusion/status post thoracentesis- patient underwent thoracentesis on 07/04/2016 with removal of 1.8 L of yellow fluid. Lites criteria fluid is transudate. Repeat chest x-ray showed improvement. Will continue  diuresis with Lasix. 3. Diabetic foot/osteomyelitis  of left first metatarsal- ID and vascular surgery was consulted. He  underwent angiogram on 07/10/16. Plan is for angiogram of left lower extremity with possible distal excess and debridement of first metatarsal of left foot. Continue IV cefazolin. 4. Candida dermatitis of the groin- continue fluconazole, nystatin powder 5. Chronic atrial fibrillation- CHA2DS2VASc score is 6, not on rate controlling agents. Coumadin has been restarted, on IV heparin for bridging. 6. Anemia - acute blood loss , anemia. Post op. Hemoglobin is 8.8. Will check cbc in am. 7. CAD, status post CABG- patient not on aspirin, continue statin. Has permanent pacemaker for complete heart block. 8. History of atrial thrombus- patient on Coumadin at home, switched to heparin for bridging. We restarted coumadin. 9. Status post aVR- patient has bioprosthetic aortic valve. 10. Diabetes mellitus- continue Lantus, sliding scale insulin. 11. Dementia- patient has very poor short-term memory, no behavior disturbance.    DVT prophylaxis: On IV heparin, coumadin  Code Status: Full code  Family Communication: No family present at bedside   Disposition Plan: SNF   Consultants:  Vascular surgery  infectious disease  Procedures:  Thoracentesis  Continuous infusions . heparin Stopped (07/18/16 2200)      Antibiotics:   Anti-infectives    Start     Dose/Rate Route Frequency Ordered Stop   07/14/16 0600  ceFAZolin (ANCEF) IVPB 1 g/50 mL premix  Status:  Discontinued    Comments:  Send with pt to OR   1 g 100 mL/hr over 30 Minutes Intravenous To Short Stay 07/11/16 0820 07/11/16 1139   07/11/16 1200  ceFAZolin (ANCEF) IVPB 2g/100 mL premix     2 g 200 mL/hr over 30 Minutes Intravenous Every 8 hours 07/11/16 1138     07/05/16 1330  fluconazole (DIFLUCAN) tablet 100 mg  100 mg Oral Daily 07/05/16 1231     07/04/16 2200  vancomycin (VANCOCIN) 1,750 mg in sodium chloride 0.9 % 500 mL IVPB  Status:  Discontinued     1,750 mg 250 mL/hr over 120 Minutes Intravenous Every 24 hours  07/04/16 2132 07/05/16 1231   07/03/16 1930  cephALEXin (KEFLEX) capsule 500 mg  Status:  Discontinued     500 mg Oral Every 8 hours 07/03/16 1835 07/05/16 1458       Objective   Vitals:   07/18/16 2205 07/19/16 0612 07/19/16 0800 07/19/16 1018  BP: (!) 119/48 (!) 97/34 100/60 (!) 121/52  Pulse: 60 62  62  Resp: 18 18  18   Temp: 98.1 F (36.7 C) 98 F (36.7 C)  98.2 F (36.8 C)  TempSrc: Oral Oral  Oral  SpO2: 98% 98%  99%  Weight:  86.8 kg (191 lb 4.8 oz)    Height:        Intake/Output Summary (Last 24 hours) at 07/19/16 1127 Last data filed at 07/19/16 1040  Gross per 24 hour  Intake             1080 ml  Output             1875 ml  Net             -795 ml   Filed Weights   07/17/16 0521 07/18/16 0506 07/19/16 0612  Weight: 86.9 kg (191 lb 8 oz) 88.1 kg (194 lb 3.2 oz) 86.8 kg (191 lb 4.8 oz)     Physical Examination:  General exam: Appears calm and comfortable. Respiratory system: Clear to auscultation. Respiratory effort normal. Cardiovascular system:  RRR. No  murmurs, rubs, gallops. No pedal edema. GI system: Abdomen is nondistended, soft and nontender. No organomegaly.  Central nervous system. No focal neurological deficits. 5 x 5 power in all extremities. Psychiatry: Alert, oriented x 3.Judgement and insight appear normal. Affect normal. Foot,  left - post op changes, s/p resection of first metatarsal. Wound looks clean.    Data Reviewed: I have personally reviewed following labs and imaging studies  CBG:  Recent Labs Lab 07/18/16 0540 07/18/16 1138 07/18/16 1656 07/18/16 2122 07/19/16 0608  GLUCAP 128* 135* 210* 166* 147*    CBC:  Recent Labs Lab 07/15/16 0500 07/16/16 0530 07/17/16 0339 07/18/16 0357 07/19/16 0430  WBC 12.7* 13.2* 14.1* 11.9* 12.1*  HGB 11.8* 11.2* 10.1* 9.7* 8.8*  HCT 36.6* 35.7* 31.7* 31.0* 28.3*  MCV 88.4 89.0 89.8 90.1 89.8  PLT 275 262 205 232 0000000    Basic Metabolic Panel:  Recent Labs Lab 07/14/16 0450  07/15/16 0500 07/17/16 0339 07/17/16 1241 07/19/16 0430  NA 133* 132* 135  --  133*  K 4.4 4.7 4.7  --  4.5  CL 92* 92* 97*  --  96*  CO2 34* 32 30  --  29  GLUCOSE 144* 139* 148*  --  141*  BUN 40* 37* 37*  --  63*  CREATININE 1.52* 1.31* 1.46*  --  1.34*  CALCIUM 10.3 10.5* 9.7  --  9.3  MG  --   --   --  1.8  --     Recent Results (from the past 240 hour(s))  Surgical PCR screen     Status: None   Collection Time: 07/16/16  6:27 AM  Result Value Ref Range Status   MRSA, PCR NEGATIVE NEGATIVE Final   Staphylococcus aureus NEGATIVE NEGATIVE Final    Comment:  The Xpert SA Assay (FDA approved for NASAL specimens in patients over 63 years of age), is one component of a comprehensive surveillance program.  Test performance has been validated by [facility] for patients greater than or equal to 76 year old. It is not intended to diagnose infection nor to guide or monitor treatment.        Recent Labs  07/03/16 1540  BNP 178.7*    ProBNP (last 3 results) No results for input(s): PROBNP in the last 8760 hours.    Studies: No results found.  Scheduled Meds: . aspirin EC  81 mg Oral Daily  .  ceFAZolin (ANCEF) IV  2 g Intravenous Q8H  . donepezil  10 mg Oral QHS  . ezetimibe-simvastatin  1 tablet Oral QHS  . feeding supplement (ENSURE ENLIVE)  237 mL Oral Q24H  . fluconazole  100 mg Oral Daily  . furosemide  40 mg Oral BID  . insulin aspart  0-9 Units Subcutaneous TID WC  . insulin glargine  12 Units Subcutaneous QHS  . multivitamin with minerals  1 tablet Oral Daily  . nystatin   Topical TID  . protein supplement shake  11 oz Oral Q24H  . sodium chloride flush  3 mL Intravenous Q12H  . warfarin  5 mg Oral ONCE-1800  . Warfarin - Pharmacist Dosing Inpatient   Does not apply q1800      Time spent: 25 min  [facility] Pager [phone]. If 7PM-7AM, please contact night-coverage at [email or web address], Office  [phone]   password [email or web address] 07/19/2016, 11:27 AM  LOS: 16 days

## 2016-07-19 NOTE — Progress Notes (Signed)
  Progress Note    07/19/2016 2:13 PM 3 Days Post-Op  Subjective no complaints today  Vitals:   07/19/16 0800 07/19/16 1018  BP: 100/60 (!) 121/52  Pulse:  62  Resp:  18  Temp:  98.2 F (36.8 C)    Physical Exam: Awake and alert Right groin is soft without hematoma Left foot wound in tact with sutures dp is monophasic  CBC    Component Value Date/Time   WBC 12.1 (H) 07/19/2016 0430   RBC 3.15 (L) 07/19/2016 0430   HGB 8.8 (L) 07/19/2016 0430   HGB 14.1 11/22/2010 0953   HCT 28.3 (L) 07/19/2016 0430   HCT 42.6 11/22/2010 0953   PLT 233 07/19/2016 0430   PLT 223 11/22/2010 0953   MCV 89.8 07/19/2016 0430   MCV 82 11/22/2010 0953   MCH 27.9 07/19/2016 0430   MCHC 31.1 07/19/2016 0430   RDW 15.8 (H) 07/19/2016 0430   RDW 16.3 (H) 11/22/2010 0953   LYMPHSABS 2.1 07/06/2016 0433   LYMPHSABS 2.4 11/22/2010 0953   MONOABS 1.0 07/06/2016 0433   EOSABS 0.2 07/06/2016 0433   EOSABS 0.4 11/22/2010 0953   BASOSABS 0.0 07/06/2016 0433   BASOSABS 0.1 11/22/2010 0953    BMET    Component Value Date/Time   NA 133 (L) 07/19/2016 0430   K 4.5 07/19/2016 0430   CL 96 (L) 07/19/2016 0430   CO2 29 07/19/2016 0430   GLUCOSE 141 (H) 07/19/2016 0430   GLUCOSE 229 08/07/2008 0000   BUN 63 (H) 07/19/2016 0430   CREATININE 1.34 (H) 07/19/2016 0430   CREATININE 1.17 03/19/2016 1534   CALCIUM 9.3 07/19/2016 0430   GFRNONAA 49 (L) 07/19/2016 0430   GFRAA 56 (L) 07/19/2016 0430    INR    Component Value Date/Time   INR 1.43 07/19/2016 0430   INR 1.7 09/02/2010 1323     Intake/Output Summary (Last 24 hours) at 07/19/16 1413 Last data filed at 07/19/16 1040  Gross per 24 hour  Intake              780 ml  Output             1575 ml  Net             -795 ml     Assessment:  82 y.o. male is s/p revascularization of lle AT and metatarsal head resection. With monophasic dp I suspect maybe AT has rethrombosed  Plan: Ok to continue to bridge to coumadin Ambulate with  darko shoe Will follow for healing of wound, suspect he may require bka at some point  [place] C. Donzetta Matters, MD [facility] [place] [facility] Office: [phone] Pager: [phone]  07/19/2016 2:13 PM

## 2016-07-19 NOTE — Progress Notes (Signed)
ANTICOAGULATION CONSULT NOTE  Pharmacy Consult:  Heparin and Warfarin Indication:  History of Afib  Allergies  Allergen Reactions  . Potassium-Containing Compounds Anaphylaxis    IV--loss of conciousness  . Metoprolol Succinate Other (See Comments)    Extremely tired    Patient Measurements: Height: 5\' 10"  (177.8 cm) Weight: 191 lb 4.8 oz (86.8 kg) IBW/kg (Calculated) : 73 Heparin Dosing Weight: 88 kg  Vital Signs: Temp: 98 F (36.7 C) (01/27 0612) Temp Source: Oral (01/27 0612) BP: 100/60 (01/27 0800) Pulse Rate: 62 (01/27 0612)  Labs:  Recent Labs  07/17/16 0339 07/18/16 0357 07/19/16 0430  HGB 10.1* 9.7* 8.8*  HCT 31.7* 31.0* 28.3*  PLT 205 232 233  LABPROT 17.8* 16.3* 17.6*  INR 1.45 1.30 1.43  HEPARINUNFRC 0.46 0.46 0.45  CREATININE 1.46*  --  1.34*    Estimated Creatinine Clearance: 46.2 mL/min (by C-G formula based on SCr of 1.34 mg/dL (H)).  Assessment: 52 yoM on warfarin PTA for history of Afib, also with history of bioprosthetic AVR and LAA thrombus. Warfarin initially held on admit and bridged with heparin infusion, pt is now s/p angiogram 1/18 and OR 1/24 for debridement of 1st metatarsal on left. Pharmacy consulted to resume heparin post-op and resumed warfarin 1/26. Hgb trending down, no S/Sx bleeding documented, plt stable. Heparin level therapeutic at 0.45, INR trending up to 1.43.   PTA Warfarin Dose: 7.5mg  Fri and 5mg  AODs  Goal of Therapy:  INR goal 2.0-3.0 Heparin level 0.3-0.7 units/ml Monitor platelets by anticoagulation protocol: Yes   Plan:  -Continue heparin 1500 units/h  -Warfarin 5mg  PO x1 tonight -Daily HL/INR/CBC -Monitor for s/sx bleeding  Arrie Senate, PharmD PGY-1 Pharmacy Resident Pager: [phone] 07/19/2016

## 2016-07-20 ENCOUNTER — Encounter (HOSPITAL_COMMUNITY): Payer: Medicare Other

## 2016-07-20 DIAGNOSIS — M9080 Osteopathy in diseases classified elsewhere, unspecified site: Secondary | ICD-10-CM

## 2016-07-20 DIAGNOSIS — I4891 Unspecified atrial fibrillation: Secondary | ICD-10-CM

## 2016-07-20 LAB — CBC
HEMATOCRIT: 26.6 % — AB (ref 39.0–52.0)
Hemoglobin: 8.2 g/dL — ABNORMAL LOW (ref 13.0–17.0)
MCH: 27.8 pg (ref 26.0–34.0)
MCHC: 30.8 g/dL (ref 30.0–36.0)
MCV: 90.2 fL (ref 78.0–100.0)
PLATELETS: 248 10*3/uL (ref 150–400)
RBC: 2.95 MIL/uL — ABNORMAL LOW (ref 4.22–5.81)
RDW: 16 % — AB (ref 11.5–15.5)
WBC: 11.9 10*3/uL — ABNORMAL HIGH (ref 4.0–10.5)

## 2016-07-20 LAB — HEPARIN LEVEL (UNFRACTIONATED): HEPARIN UNFRACTIONATED: 0.36 [IU]/mL (ref 0.30–0.70)

## 2016-07-20 LAB — GLUCOSE, CAPILLARY
GLUCOSE-CAPILLARY: 164 mg/dL — AB (ref 65–99)
GLUCOSE-CAPILLARY: 165 mg/dL — AB (ref 65–99)
Glucose-Capillary: 166 mg/dL — ABNORMAL HIGH (ref 65–99)
Glucose-Capillary: 118 mg/dL — ABNORMAL HIGH (ref 65–99)

## 2016-07-20 LAB — PROTIME-INR
INR: 1.67
Prothrombin Time: 19.9 seconds — ABNORMAL HIGH (ref 11.4–15.2)

## 2016-07-20 MED ORDER — WARFARIN SODIUM 5 MG PO TABS
5.0000 mg | ORAL_TABLET | Freq: Once | ORAL | Status: AC
  Administered 2016-07-20: 5 mg via ORAL
  Filled 2016-07-20: qty 1

## 2016-07-20 NOTE — Progress Notes (Signed)
ANTICOAGULATION CONSULT NOTE  Pharmacy Consult:  Heparin and Warfarin Indication:  History of Afib  Allergies  Allergen Reactions  . Potassium-Containing Compounds Anaphylaxis    IV--loss of conciousness  . Metoprolol Succinate Other (See Comments)    Extremely tired    Patient Measurements: Height: 5\' 10"  (177.8 cm) Weight: 190 lb (86.2 kg) IBW/kg (Calculated) : 73 Heparin Dosing Weight: 88 kg  Vital Signs: Temp: 97.4 F (36.3 C) (01/28 0559) Temp Source: Oral (01/28 0559) BP: 105/48 (01/28 0559) Pulse Rate: 59 (01/28 0559)  Labs:  Recent Labs  07/18/16 0357 07/19/16 0430 07/20/16 0434  HGB 9.7* 8.8* 8.2*  HCT 31.0* 28.3* 26.6*  PLT 232 233 248  LABPROT 16.3* 17.6* 19.9*  INR 1.30 1.43 1.67  HEPARINUNFRC 0.46 0.45 0.36  CREATININE  --  1.34*  --     Estimated Creatinine Clearance: 46.2 mL/min (by C-G formula based on SCr of 1.34 mg/dL (H)).  Assessment: 42 yoM on warfarin PTA for history of Afib, also with history of bioprosthetic AVR and LAA thrombus. Warfarin initially held on admit and bridged with heparin infusion, pt is now s/p angiogram 1/18 and OR 1/24 for debridement of 1st metatarsal on left. Pharmacy consulted to resume heparin post-op and also resumed warfarin 1/26. Hgb trending down but no S/Sx bleeding reported, plt stable. Heparin level therapeutic at 0.36, INR continues trending up to 1.67  PTA Warfarin Dose: 7.5mg  Fri and 5mg  all other days  Goal of Therapy:  INR goal 2.0-3.0 Heparin level 0.3-0.7 units/ml Monitor platelets by anticoagulation protocol: Yes   Plan:  -Continue heparin 1500 units/h  -Warfarin 5mg  PO x1 tonight -Daily heparin level/INR/CBC -Monitor for s/sx bleeding  Arrie Senate, PharmD PGY-1 Pharmacy Resident Pager: [phone] 07/20/2016

## 2016-07-20 NOTE — Progress Notes (Signed)
[facility]  PROGRESS NOTE  [name]. [id number] DOB: 01-12-1935 DOA: 07/03/2016 PCP: [name], MD   Brief HPI:    82 y.o.malewith PMH of Dementia, poorly controlled DM, severe AS status post redo aVR (bioprosthetic valve), chronic diastolic heart failure, CAD status post CABG, chronic valvular atrial fibrillation on Coumadin, history of left atrial thrombus noted on TEE in 7/12, history of peripheral vascular disease s/p PTCA of R tibial artery and Left SFA stent placement, amputation of L great toe and subsequent left foot ulcer with osteomyelitis on Keflex was referred from the infectious disease MDs office for evaluation of hypoxia. Found to have acute on chronic diastolic heart failure and started on diuresis. Infectious disease and vascular surgery is following for chronic osteomyelitis, awaiting left first MT amputation per vascular surgery.   Subjective   Patient denies pain, s/p resection 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 head and sesamoid bone. Denies pain.   Assessment/Plan:     1. Acute hypoxic respiratory failure-  Resolved, secondary to diastolic CHF, EF 0000000, severe pulmonary  hypertension at 83 mmHg. Patient was started on diuretics and initially responded to Lasix. On 07/09/2016 patient's exam was consistent with recurrence of effusion, chest x-ray confirmed recurrence of moderate sized right pleural effusion. IV Lasix was restarted 3 times a day. On 07/14/2016 chest x-ray showed improvement and IV Lasix was transitioned to oral Lasix. We'll continue with diuresis with Lasix 40 mg twice a day.  2. Right pleural effusion/status post thoracentesis- patient underwent thoracentesis on 07/04/2016 with removal of 1.8 L of yellow fluid. Lites criteria fluid is transudate. Repeat chest x-ray showed improvement. Will continue  diuresis with Lasix. 3. Diabetic foot/osteomyelitis  of left first metatarsal- ID and vascular surgery was consulted. He underwent angiogram on  07/10/16. Plan is for angiogram of left lower extremity with possible distal excess and debridement of first metatarsal of left foot. Continue IV cefazolin. 4. Candida dermatitis of the groin- continue fluconazole, nystatin powder 5. Chronic atrial fibrillation- CHA2DS2VASc score is 6, not on rate controlling agents. Coumadin has been restarted, on IV heparin for bridging. 6. Anemia - acute blood loss , anemia. Post op. Hemoglobin is 8.2. Will check cbc in am. 7. CAD, status post CABG- patient not on aspirin, continue statin. Has permanent pacemaker for complete heart block. 8. History of atrial thrombus- patient on Coumadin at home, switched to heparin for bridging. We restarted coumadin. 9. Status post aVR- patient has bioprosthetic aortic valve. 10. Diabetes mellitus- continue Lantus, sliding scale insulin. 11. Dementia- patient has very poor short-term memory, no behavior disturbance.    DVT prophylaxis: On IV heparin, coumadin  Code Status: Full code  Family Communication: No family present at bedside   Disposition Plan: SNF   Consultants:  Vascular surgery  infectious disease  Procedures:  Thoracentesis  Continuous infusions . heparin 1,500 Units/hr (07/20/16 0531)      Antibiotics:   Anti-infectives    Start     Dose/Rate Route Frequency Ordered Stop   07/14/16 0600  ceFAZolin (ANCEF) IVPB 1 g/50 mL premix  Status:  Discontinued    Comments:  Send with pt to OR   1 g 100 mL/hr over 30 Minutes Intravenous To Short Stay 07/11/16 0820 07/11/16 1139   07/11/16 1200  ceFAZolin (ANCEF) IVPB 2g/100 mL premix     2 g 200 mL/hr over 30 Minutes Intravenous Every 8 hours 07/11/16 1138     07/05/16 1330  fluconazole (DIFLUCAN) tablet 100 mg  100 mg Oral Daily 07/05/16 1231     07/04/16 2200  vancomycin (VANCOCIN) 1,750 mg in sodium chloride 0.9 % 500 mL IVPB  Status:  Discontinued     1,750 mg 250 mL/hr over 120 Minutes Intravenous Every 24 hours 07/04/16 2132  07/05/16 1231   07/03/16 1930  cephALEXin (KEFLEX) capsule 500 mg  Status:  Discontinued     500 mg Oral Every 8 hours 07/03/16 1835 07/05/16 1458       Objective   Vitals:   07/19/16 1800 07/19/16 2001 07/20/16 0559 07/20/16 0805  BP: 100/60 (!) 113/44 (!) 105/48 (!) 118/42  Pulse:  60 (!) 59 60  Resp:  20 20 20   Temp:  97.7 F (36.5 C) 97.4 F (36.3 C) 97.6 F (36.4 C)  TempSrc:  Oral Oral Oral  SpO2:  92% 94% 91%  Weight:   86.2 kg (190 lb)   Height:        Intake/Output Summary (Last 24 hours) at 07/20/16 1249 Last data filed at 07/20/16 0945  Gross per 24 hour  Intake             1157 ml  Output              200 ml  Net              957 ml   Filed Weights   07/18/16 0506 07/19/16 0612 07/20/16 0559  Weight: 88.1 kg (194 lb 3.2 oz) 86.8 kg (191 lb 4.8 oz) 86.2 kg (190 lb)     Physical Examination:  General exam: Appears calm and comfortable. Respiratory system: Clear to auscultation. Respiratory effort normal. Cardiovascular system:  RRR. No  murmurs, rubs, gallops. No pedal edema. GI system: Abdomen is nondistended, soft and nontender. No organomegaly.  Central nervous system. No focal neurological deficits. 5 x 5 power in all extremities. Psychiatry: Alert, oriented x 3.Judgement and insight appear normal. Affect normal. Foot,  left - post op changes, s/p resection of first metatarsal. Wound looks clean.    Data Reviewed: I have personally reviewed following labs and imaging studies  CBG:  Recent Labs Lab 07/19/16 1139 07/19/16 1640 07/19/16 2126 07/20/16 0610 07/20/16 1118  GLUCAP 130* 176* 185* 164* 165*    CBC:  Recent Labs Lab 07/16/16 0530 07/17/16 0339 07/18/16 0357 07/19/16 0430 07/20/16 0434  WBC 13.2* 14.1* 11.9* 12.1* 11.9*  HGB 11.2* 10.1* 9.7* 8.8* 8.2*  HCT 35.7* 31.7* 31.0* 28.3* 26.6*  MCV 89.0 89.8 90.1 89.8 90.2  PLT 262 205 232 233 [id number]    Basic Metabolic Panel:  Recent Labs Lab 07/14/16 0450 07/15/16 0500  07/17/16 0339 07/17/16 1241 07/19/16 0430  NA 133* 132* 135  --  133*  K 4.4 4.7 4.7  --  4.5  CL 92* 92* 97*  --  96*  CO2 34* 32 30  --  29  GLUCOSE 144* 139* 148*  --  141*  BUN 40* 37* 37*  --  63*  CREATININE 1.52* 1.31* 1.46*  --  1.34*  CALCIUM 10.3 10.5* 9.7  --  9.3  MG  --   --   --  1.8  --     Recent Results (from the past 240 hour(s))  Surgical PCR screen     Status: None   Collection Time: 07/16/16  6:27 AM  Result Value Ref Range Status   MRSA, PCR NEGATIVE NEGATIVE Final   Staphylococcus aureus NEGATIVE NEGATIVE Final    Comment:  The Xpert SA Assay (FDA approved for NASAL specimens in patients over 48 years of age), is one component of a comprehensive surveillance program.  Test performance has been validated by [facility] for patients greater than or equal to 49 year old. It is not intended to diagnose infection nor to guide or monitor treatment.        Recent Labs  07/03/16 1540  BNP 178.7*    ProBNP (last 3 results) No results for input(s): PROBNP in the last 8760 hours.    Studies: No results found.  Scheduled Meds: . aspirin EC  81 mg Oral Daily  .  ceFAZolin (ANCEF) IV  2 g Intravenous Q8H  . donepezil  10 mg Oral QHS  . ezetimibe-simvastatin  1 tablet Oral QHS  . feeding supplement (ENSURE ENLIVE)  237 mL Oral Q24H  . fluconazole  100 mg Oral Daily  . furosemide  40 mg Oral BID  . insulin aspart  0-9 Units Subcutaneous TID WC  . insulin glargine  12 Units Subcutaneous QHS  . multivitamin with minerals  1 tablet Oral Daily  . nystatin   Topical TID  . protein supplement shake  11 oz Oral Q24H  . sodium chloride flush  3 mL Intravenous Q12H  . warfarin  5 mg Oral ONCE-1800  . Warfarin - Pharmacist Dosing Inpatient   Does not apply q1800      Time spent: 25 min  [facility] Pager [phone]. If 7PM-7AM, please contact night-coverage at [email or web address], Office  [phone]  password  [email or web address] 07/20/2016, 12:49 PM  LOS: 17 days

## 2016-07-20 NOTE — Clinical Social Work Note (Signed)
FU with pt's wife to discuss bed offers. Wife wanted [facility] but they declined, will go with [facility] instead. CSW verified with Rollene Fare that bed is available for pt.  CSW will continue to follow and coordinate DC when pt is medically stable.  Reshma Hoey B. Joline Maxcy Clinical Social Work Dept Weekend Social Worker [phone] 4:24 PM

## 2016-07-20 NOTE — Progress Notes (Signed)
  Progress Note    07/20/2016 12:12 PM 4 Days Post-Op  Subjective:  No complaints  Vitals:   07/20/16 0559 07/20/16 0805  BP: (!) 105/48 (!) 118/42  Pulse: (!) 59 60  Resp: 20 20  Temp: 97.4 F (36.3 C) 97.6 F (36.4 C)    Physical Exam: Awake and alert Right groin without hematoma Left foot sutures have dehisced Monophasic dp without AT signal any longer Peroneal signal at ankle  CBC    Component Value Date/Time   WBC 11.9 (H) 07/20/2016 0434   RBC 2.95 (L) 07/20/2016 0434   HGB 8.2 (L) 07/20/2016 0434   HGB 14.1 11/22/2010 0953   HCT 26.6 (L) 07/20/2016 0434   HCT 42.6 11/22/2010 0953   PLT 248 07/20/2016 0434   PLT 223 11/22/2010 0953   MCV 90.2 07/20/2016 0434   MCV 82 11/22/2010 0953   MCH 27.8 07/20/2016 0434   MCHC 30.8 07/20/2016 0434   RDW 16.0 (H) 07/20/2016 0434   RDW 16.3 (H) 11/22/2010 0953   LYMPHSABS 2.1 07/06/2016 0433   LYMPHSABS 2.4 11/22/2010 0953   MONOABS 1.0 07/06/2016 0433   EOSABS 0.2 07/06/2016 0433   EOSABS 0.4 11/22/2010 0953   BASOSABS 0.0 07/06/2016 0433   BASOSABS 0.1 11/22/2010 0953    BMET    Component Value Date/Time   NA 133 (L) 07/19/2016 0430   K 4.5 07/19/2016 0430   CL 96 (L) 07/19/2016 0430   CO2 29 07/19/2016 0430   GLUCOSE 141 (H) 07/19/2016 0430   GLUCOSE 229 08/07/2008 0000   BUN 63 (H) 07/19/2016 0430   CREATININE 1.34 (H) 07/19/2016 0430   CREATININE 1.17 03/19/2016 1534   CALCIUM 9.3 07/19/2016 0430   GFRNONAA 49 (L) 07/19/2016 0430   GFRAA 56 (L) 07/19/2016 0430    INR    Component Value Date/Time   INR 1.67 07/20/2016 0434   INR 1.7 09/02/2010 1323     Intake/Output Summary (Last 24 hours) at 07/20/16 1212 Last data filed at 07/20/16 0945  Gross per 24 hour  Intake             1157 ml  Output              200 ml  Net              957 ml     Assessment/Plan:  82 y.o. male is s/p revascularization of AT with pedal access and has now thrombosed. On coumadin and remains subtherapeutic.  Wound opened today at bedside and will perform wet to dry dressings. Hopeful wound will heal given patency of peroneal artery.    Basha Krygier C. Donzetta Matters, MD [facility] Office: [phone] Pager: [phone]  07/20/2016 12:12 PM

## 2016-07-20 NOTE — Progress Notes (Signed)
Pt's surgical site was dressed by Vascular surgery this morning but later pt started to bleed but not that heavily so dressing reinforced, pt is in heparin @15ml /hr, no any other complications noted, will continue to monitor the patient

## 2016-07-21 ENCOUNTER — Inpatient Hospital Stay (HOSPITAL_COMMUNITY): Payer: Medicare Other

## 2016-07-21 DIAGNOSIS — M6281 Muscle weakness (generalized): Secondary | ICD-10-CM | POA: Diagnosis not present

## 2016-07-21 DIAGNOSIS — S42211D Unspecified displaced fracture of surgical neck of right humerus, subsequent encounter for fracture with routine healing: Secondary | ICD-10-CM | POA: Diagnosis not present

## 2016-07-21 DIAGNOSIS — G309 Alzheimer's disease, unspecified: Secondary | ICD-10-CM | POA: Diagnosis not present

## 2016-07-21 DIAGNOSIS — D649 Anemia, unspecified: Secondary | ICD-10-CM | POA: Diagnosis not present

## 2016-07-21 DIAGNOSIS — J90 Pleural effusion, not elsewhere classified: Secondary | ICD-10-CM | POA: Diagnosis not present

## 2016-07-21 DIAGNOSIS — I5032 Chronic diastolic (congestive) heart failure: Secondary | ICD-10-CM | POA: Diagnosis not present

## 2016-07-21 DIAGNOSIS — I472 Ventricular tachycardia: Secondary | ICD-10-CM | POA: Diagnosis not present

## 2016-07-21 DIAGNOSIS — E1151 Type 2 diabetes mellitus with diabetic peripheral angiopathy without gangrene: Secondary | ICD-10-CM | POA: Diagnosis not present

## 2016-07-21 DIAGNOSIS — W19XXXA Unspecified fall, initial encounter: Secondary | ICD-10-CM | POA: Diagnosis not present

## 2016-07-21 DIAGNOSIS — R2689 Other abnormalities of gait and mobility: Secondary | ICD-10-CM | POA: Diagnosis not present

## 2016-07-21 DIAGNOSIS — Y92129 Unspecified place in nursing home as the place of occurrence of the external cause: Secondary | ICD-10-CM | POA: Diagnosis not present

## 2016-07-21 DIAGNOSIS — L97523 Non-pressure chronic ulcer of other part of left foot with necrosis of muscle: Secondary | ICD-10-CM | POA: Diagnosis present

## 2016-07-21 DIAGNOSIS — Z89412 Acquired absence of left great toe: Secondary | ICD-10-CM | POA: Diagnosis not present

## 2016-07-21 DIAGNOSIS — S42301A Unspecified fracture of shaft of humerus, right arm, initial encounter for closed fracture: Secondary | ICD-10-CM | POA: Diagnosis not present

## 2016-07-21 DIAGNOSIS — I739 Peripheral vascular disease, unspecified: Secondary | ICD-10-CM

## 2016-07-21 DIAGNOSIS — E1122 Type 2 diabetes mellitus with diabetic chronic kidney disease: Secondary | ICD-10-CM | POA: Diagnosis not present

## 2016-07-21 DIAGNOSIS — W06XXXA Fall from bed, initial encounter: Secondary | ICD-10-CM | POA: Diagnosis present

## 2016-07-21 DIAGNOSIS — K2960 Other gastritis without bleeding: Secondary | ICD-10-CM | POA: Diagnosis present

## 2016-07-21 DIAGNOSIS — I130 Hypertensive heart and chronic kidney disease with heart failure and stage 1 through stage 4 chronic kidney disease, or unspecified chronic kidney disease: Secondary | ICD-10-CM | POA: Diagnosis present

## 2016-07-21 DIAGNOSIS — I493 Ventricular premature depolarization: Secondary | ICD-10-CM | POA: Diagnosis not present

## 2016-07-21 DIAGNOSIS — S42214A Unspecified nondisplaced fracture of surgical neck of right humerus, initial encounter for closed fracture: Secondary | ICD-10-CM | POA: Diagnosis not present

## 2016-07-21 DIAGNOSIS — D509 Iron deficiency anemia, unspecified: Secondary | ICD-10-CM | POA: Diagnosis not present

## 2016-07-21 DIAGNOSIS — S299XXA Unspecified injury of thorax, initial encounter: Secondary | ICD-10-CM | POA: Diagnosis not present

## 2016-07-21 DIAGNOSIS — D62 Acute posthemorrhagic anemia: Secondary | ICD-10-CM | POA: Diagnosis not present

## 2016-07-21 DIAGNOSIS — J9691 Respiratory failure, unspecified with hypoxia: Secondary | ICD-10-CM | POA: Diagnosis not present

## 2016-07-21 DIAGNOSIS — L97512 Non-pressure chronic ulcer of other part of right foot with fat layer exposed: Secondary | ICD-10-CM | POA: Diagnosis not present

## 2016-07-21 DIAGNOSIS — R278 Other lack of coordination: Secondary | ICD-10-CM | POA: Diagnosis not present

## 2016-07-21 DIAGNOSIS — D638 Anemia in other chronic diseases classified elsewhere: Secondary | ICD-10-CM | POA: Diagnosis present

## 2016-07-21 DIAGNOSIS — Z9981 Dependence on supplemental oxygen: Secondary | ICD-10-CM | POA: Diagnosis not present

## 2016-07-21 DIAGNOSIS — N183 Chronic kidney disease, stage 3 (moderate): Secondary | ICD-10-CM | POA: Diagnosis not present

## 2016-07-21 DIAGNOSIS — R791 Abnormal coagulation profile: Secondary | ICD-10-CM | POA: Diagnosis not present

## 2016-07-21 DIAGNOSIS — D500 Iron deficiency anemia secondary to blood loss (chronic): Secondary | ICD-10-CM | POA: Diagnosis not present

## 2016-07-21 DIAGNOSIS — K922 Gastrointestinal hemorrhage, unspecified: Secondary | ICD-10-CM | POA: Diagnosis not present

## 2016-07-21 DIAGNOSIS — R0902 Hypoxemia: Secondary | ICD-10-CM | POA: Diagnosis not present

## 2016-07-21 DIAGNOSIS — B372 Candidiasis of skin and nail: Secondary | ICD-10-CM | POA: Diagnosis not present

## 2016-07-21 DIAGNOSIS — S0990XA Unspecified injury of head, initial encounter: Secondary | ICD-10-CM | POA: Diagnosis not present

## 2016-07-21 DIAGNOSIS — M25511 Pain in right shoulder: Secondary | ICD-10-CM | POA: Diagnosis not present

## 2016-07-21 DIAGNOSIS — K250 Acute gastric ulcer with hemorrhage: Secondary | ICD-10-CM | POA: Diagnosis not present

## 2016-07-21 DIAGNOSIS — I2510 Atherosclerotic heart disease of native coronary artery without angina pectoris: Secondary | ICD-10-CM | POA: Diagnosis not present

## 2016-07-21 DIAGNOSIS — J449 Chronic obstructive pulmonary disease, unspecified: Secondary | ICD-10-CM | POA: Diagnosis present

## 2016-07-21 DIAGNOSIS — S199XXA Unspecified injury of neck, initial encounter: Secondary | ICD-10-CM | POA: Diagnosis not present

## 2016-07-21 DIAGNOSIS — J989 Respiratory disorder, unspecified: Secondary | ICD-10-CM | POA: Diagnosis not present

## 2016-07-21 DIAGNOSIS — S42211A Unspecified displaced fracture of surgical neck of right humerus, initial encounter for closed fracture: Secondary | ICD-10-CM | POA: Diagnosis not present

## 2016-07-21 DIAGNOSIS — R5381 Other malaise: Secondary | ICD-10-CM | POA: Diagnosis not present

## 2016-07-21 DIAGNOSIS — E875 Hyperkalemia: Secondary | ICD-10-CM | POA: Diagnosis present

## 2016-07-21 DIAGNOSIS — I509 Heart failure, unspecified: Secondary | ICD-10-CM | POA: Diagnosis not present

## 2016-07-21 DIAGNOSIS — J9621 Acute and chronic respiratory failure with hypoxia: Secondary | ICD-10-CM | POA: Diagnosis not present

## 2016-07-21 DIAGNOSIS — F0280 Dementia in other diseases classified elsewhere without behavioral disturbance: Secondary | ICD-10-CM | POA: Diagnosis present

## 2016-07-21 DIAGNOSIS — R0600 Dyspnea, unspecified: Secondary | ICD-10-CM | POA: Diagnosis not present

## 2016-07-21 DIAGNOSIS — I482 Chronic atrial fibrillation: Secondary | ICD-10-CM | POA: Diagnosis not present

## 2016-07-21 DIAGNOSIS — I5031 Acute diastolic (congestive) heart failure: Secondary | ICD-10-CM | POA: Diagnosis not present

## 2016-07-21 DIAGNOSIS — D689 Coagulation defect, unspecified: Secondary | ICD-10-CM | POA: Diagnosis not present

## 2016-07-21 DIAGNOSIS — T148XXA Other injury of unspecified body region, initial encounter: Secondary | ICD-10-CM | POA: Diagnosis not present

## 2016-07-21 DIAGNOSIS — G4733 Obstructive sleep apnea (adult) (pediatric): Secondary | ICD-10-CM | POA: Diagnosis present

## 2016-07-21 DIAGNOSIS — I10 Essential (primary) hypertension: Secondary | ICD-10-CM | POA: Diagnosis not present

## 2016-07-21 DIAGNOSIS — I959 Hypotension, unspecified: Secondary | ICD-10-CM | POA: Diagnosis not present

## 2016-07-21 DIAGNOSIS — E11621 Type 2 diabetes mellitus with foot ulcer: Secondary | ICD-10-CM | POA: Diagnosis not present

## 2016-07-21 DIAGNOSIS — J9601 Acute respiratory failure with hypoxia: Secondary | ICD-10-CM | POA: Diagnosis not present

## 2016-07-21 DIAGNOSIS — R195 Other fecal abnormalities: Secondary | ICD-10-CM | POA: Diagnosis not present

## 2016-07-21 DIAGNOSIS — Z950 Presence of cardiac pacemaker: Secondary | ICD-10-CM | POA: Diagnosis not present

## 2016-07-21 DIAGNOSIS — E1169 Type 2 diabetes mellitus with other specified complication: Secondary | ICD-10-CM | POA: Diagnosis not present

## 2016-07-21 DIAGNOSIS — J9611 Chronic respiratory failure with hypoxia: Secondary | ICD-10-CM | POA: Diagnosis not present

## 2016-07-21 LAB — GLUCOSE, CAPILLARY
Glucose-Capillary: 132 mg/dL — ABNORMAL HIGH (ref 65–99)
Glucose-Capillary: 129 mg/dL — ABNORMAL HIGH (ref 65–99)
Glucose-Capillary: 155 mg/dL — ABNORMAL HIGH (ref 65–99)

## 2016-07-21 LAB — PROTIME-INR
INR: 1.8
PROTHROMBIN TIME: 21.1 s — AB (ref 11.4–15.2)

## 2016-07-21 LAB — HEPARIN LEVEL (UNFRACTIONATED): HEPARIN UNFRACTIONATED: 0.36 [IU]/mL (ref 0.30–0.70)

## 2016-07-21 LAB — BASIC METABOLIC PANEL
Anion gap: 8 (ref 5–15)
BUN: 56 mg/dL — AB (ref 6–20)
CALCIUM: 9.7 mg/dL (ref 8.9–10.3)
CO2: 31 mmol/L (ref 22–32)
Chloride: 98 mmol/L — ABNORMAL LOW (ref 101–111)
Creatinine, Ser: 1.08 mg/dL (ref 0.61–1.24)
GFR calc Af Amer: >60 mL/min (ref >60)
GLUCOSE: 129 mg/dL — AB (ref 65–99)
Potassium: 4.1 mmol/L (ref 3.5–5.1)
Sodium: 137 mmol/L (ref 135–145)

## 2016-07-21 LAB — CBC
HCT: 24.8 % — ABNORMAL LOW (ref 39.0–52.0)
Hemoglobin: 7.7 g/dL — ABNORMAL LOW (ref 13.0–17.0)
MCH: 27.9 pg (ref 26.0–34.0)
MCHC: 31 g/dL (ref 30.0–36.0)
MCV: 89.9 fL (ref 78.0–100.0)
PLATELETS: 267 10*3/uL (ref 150–400)
RBC: 2.76 MIL/uL — ABNORMAL LOW (ref 4.22–5.81)
RDW: 16.1 % — ABNORMAL HIGH (ref 11.5–15.5)
WBC: 12.8 10*3/uL — ABNORMAL HIGH (ref 4.0–10.5)

## 2016-07-21 MED ORDER — ASPIRIN 81 MG PO TBEC: 81.0000 mg | DELAYED_RELEASE_TABLET | Freq: Every day | ORAL | Status: AC

## 2016-07-21 MED ORDER — NYSTATIN 100000 UNIT/GM EX POWD: Freq: Three times a day (TID) | CUTANEOUS | 0 refills | Status: DC

## 2016-07-21 MED ORDER — FUROSEMIDE 40 MG PO TABS: 40.0000 mg | ORAL_TABLET | Freq: Two times a day (BID) | ORAL | 2 refills | Status: AC

## 2016-07-21 MED ORDER — ENSURE ENLIVE PO LIQD: 237.0000 mL | ORAL | 12 refills | Status: DC

## 2016-07-21 MED ORDER — WARFARIN SODIUM 5 MG PO TABS
5.0000 mg | ORAL_TABLET | Freq: Once | ORAL | Status: AC
  Administered 2016-07-21: 5 mg via ORAL
  Filled 2016-07-21: qty 1

## 2016-07-21 MED ORDER — INSULIN ASPART 100 UNIT/ML ~~LOC~~ SOLN: 0.0000 [IU] | Freq: Three times a day (TID) | SUBCUTANEOUS | 11 refills | Status: DC

## 2016-07-21 MED ORDER — ACETAMINOPHEN 325 MG PO TABS: 650.0000 mg | ORAL_TABLET | ORAL | Status: DC | PRN

## 2016-07-21 MED ORDER — ENOXAPARIN SODIUM 150 MG/ML ~~LOC~~ SOLN
1.5000 mg/kg | Freq: Once | SUBCUTANEOUS | Status: AC
  Administered 2016-07-21: 130 mg via SUBCUTANEOUS
  Filled 2016-07-21: qty 0.86

## 2016-07-21 NOTE — Progress Notes (Signed)
Pt was discharged assisted by the carelink team. Report given to the carelink team and discharges papers given.

## 2016-07-21 NOTE — Discharge Summary (Signed)
Physician Discharge Summary  [name] [id number] DOB: 25-Oct-1934 DOA: 07/03/2016  PCP: [name], MD  Admit date: 07/03/2016 Discharge date: 07/21/2016  Time spent: 25 minutes  Recommendations for Outpatient Follow-up:  1. Check PT/INR on 07/22/16 2. Check CBC in 3 days 3. Continue twice daily wet to dry dressing changes on left foot   Discharge Diagnoses:  Principal Problem:   Diabetic foot ulcer with osteomyelitis ([place]) Active Problems:   Obstructive sleep apnea   S/P AVR (aortic valve replacement)   PACEMAKER, PERMANENT   Aortic valve stenosis, severe prosthetic   Chronic atrial fibrillation ([name])   Complete heart block ([name])   Hypoxia   Acute diastolic CHF (congestive heart failure) ([name])   Malnutrition of moderate degree   Pleural effusion   Congestive heart failure ([name])   S/P thoracentesis   Wound abscess   Discharge Condition: Stable  Diet recommendation: carb modified diet  Filed Weights   07/18/16 0506 07/19/16 0612 07/20/16 0559  Weight: 88.1 kg (194 lb 3.2 oz) 86.8 kg (191 lb 4.8 oz) 86.2 kg (190 lb)    History of present illness:  82 y.o.malewith PMH of Dementia, poorly controlled DM, severe AS status post redo aVR (bioprosthetic valve), chronic diastolic heart failure, CAD status post CABG, chronic valvular atrial fibrillation on Coumadin, history of left atrial thrombus noted on TEE in 7/12, history of peripheral vascular disease s/p PTCA of R tibial artery and Left SFA stent placement, amputation of L great toe and subsequent left foot ulcer with osteomyelitis on Keflex was referred from the infectious disease MDs office for evaluation of hypoxia. Found to have acute on chronic diastolic heart failure and started on diuresis. Infectious disease and vascular surgery is following for chronic osteomyelitis  Hospital Course:  1. Acute hypoxic respiratory failure-  Resolved, secondary to diastolic CHF, EF 0000000, severe pulmonary  hypertension at  83 mmHg. Patient was started on diuretics and initially responded to Lasix. On 07/09/2016 patient's exam was consistent with recurrence of effusion, chest x-ray confirmed recurrence of moderate sized right pleural effusion. IV Lasix was restarted 3 times a day. On 07/14/2016 chest x-ray showed improvement and IV Lasix was transitioned to oral Lasix. We'll continue with diuresis with Lasix 40 mg twice a day.  2. Right pleural effusion/status post thoracentesis- patient underwent thoracentesis on 07/04/2016 with removal of 1.8 L of yellow fluid. Lites criteria fluid is transudate. Repeat chest x-ray showed improvement. Will continue  diuresis with Lasix. 3. Diabetic foot/osteomyelitis  of left first metatarsal- ID and vascular surgery was consulted. He underwent angiogram on 07/10/16.He again underwent angiogram of left lower extremity with resection 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 head and sesmoid bone. He has completed the antibiotics in the hospital.  4. Candida dermatitis of the groin-patient was started on fluconazole, nystatin powder. Will discharge on nystatin powder tid , apply to groin for seven more days. 5. Chronic atrial fibrillationHistory of atrial thrombus/- CHA2DS2VASc score is 6, not on rate controlling agents. Coumadin has been restarted, was started on bridging with IV heparin. Today INR is 1.80, will give one dose of Lovenox and continue with coumadin. Check PT/INR in am. 6. Anemia - acute blood loss , anemia. Post op. Hemoglobin is 7.7. check CBC in 3 days. 7. CAD, status post CABG- patient not on aspirin, continue statin. Has permanent pacemaker for complete heart block. 8. Status post aVR- patient has bioprosthetic aortic valve. 9. Diabetes mellitus- continue Lantus, sliding scale insulin. 10. Dementia- patient has very poor short-term  memory, no behavior disturbance.  Procedures:  Thoracentesis Resection 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 head and sesmoid bone  Consultations:  Vascular  surgery  Infectious disease  Discharge Exam: Vitals:   07/21/16 0700 07/21/16 0758  BP: (!) 107/40 (!) 114/46  Pulse: 67 (!) 59  Resp:  18  Temp: 97.4 F (36.3 C) 97.6 F (36.4 C)    General: Appears in no acute distress Cardiovascular: RRR, S1S2 Respiratory: Clear bilaterally  Discharge Instructions   Discharge Instructions    Diet - low sodium heart healthy    Complete by:  As directed    Discharge instructions    Complete by:  As directed    Wet to dry dressing twice daily   Increase activity slowly    Complete by:  As directed      Current Discharge Medication List    START taking these medications   Details  acetaminophen (TYLENOL) 325 MG tablet Take 2 tablets (650 mg total) by mouth every 4 (four) hours as needed for headache or mild pain. Qty: 3 tablet    aspirin EC 81 MG EC tablet Take 1 tablet (81 mg total) by mouth daily.    feeding supplement, ENSURE ENLIVE, (ENSURE ENLIVE) LIQD Take 237 mLs by mouth daily. Qty: 237 mL, Refills: 12    insulin aspart (NOVOLOG) 100 UNIT/ML injection Inject 0-9 Units into the skin 3 (three) times daily with meals. Sliding scale insulin Less than 70 initiate hypoglycemia protocol 70-120  0 units 120-150 1 unit 151-200 2 units 201-250 3 units 251-300 5 units 301-350 7 units 351-400 9 units  Greater than 400 call MD Qty: 10 mL, Refills: 11    nystatin (MYCOSTATIN/NYSTOP) powder Apply topically 3 (three) times daily. Apply to groin daily Qty: 15 g, Refills: 0      CONTINUE these medications which have CHANGED   Details  furosemide (LASIX) 40 MG tablet Take 1 tablet (40 mg total) by mouth 2 (two) times daily. Qty: 30 tablet, Refills: 2      CONTINUE these medications which have NOT CHANGED   Details  donepezil (ARICEPT) 10 MG tablet Take 1 tablet (10 mg total) by mouth at bedtime. Qty: 30 tablet, Refills: 11    ezetimibe-simvastatin (VYTORIN) 10-40 MG per tablet Take 1 tablet by mouth at bedtime. Qty: 30  tablet, Refills: 3    insulin glargine (LANTUS) 100 UNIT/ML injection Inject 0.15 mLs (15 Units total) into the skin at bedtime. Qty: 10 mL, Refills: 11    lisinopril (PRINIVIL,ZESTRIL) 10 MG tablet Take 1 tablet (10 mg total) by mouth daily. Qty: 90 tablet, Refills: 3   Associated Diagnoses: Chronic diastolic heart failure ([name])    warfarin (COUMADIN) 5 MG tablet Take as directed by coumadin clinic Qty: 45 tablet, Refills: 2      STOP taking these medications     cephALEXin (KEFLEX) 500 MG capsule      Insulin Glulisine (APIDRA [place])      spironolactone (ALDACTONE) 25 MG tablet        Allergies  Allergen Reactions  . Potassium-Containing Compounds Anaphylaxis    IV--loss of conciousness  . Metoprolol Succinate Other (See Comments)    Extremely tired   Follow-up Information    [facility].   Why:  They will do your home health care at your home Contact information: [address] [place] [address] [phone]            The results of significant diagnostics from  this hospitalization (including imaging, microbiology, ancillary and laboratory) are listed below for reference.    Significant Diagnostic Studies: Dg Chest 1 View  Result Date: 07/04/2016 CLINICAL DATA:  Right-sided thoracentesis. EXAM: CHEST 1 VIEW COMPARISON:  07/04/2015 FINDINGS: Small right pleural effusion. No right pneumothorax. Left lung is clear. Stable cardiomediastinal silhouette. Prior CABG. Single lead cardiac pacemaker. No acute osseous abnormality. IMPRESSION: 1. Small right pleural effusion. No right pneumothorax status post right thoracentesis. Electronically Signed   By: Kathreen Devoid   On: 07/04/2016 11:03   Dg Chest 2 View  Result Date: 07/14/2016 CLINICAL DATA:  Shortness of breath. EXAM: CHEST  2 VIEW COMPARISON:  07/12/2016 FINDINGS: The heart is mildly enlarged but stable. Stable right ventricular pacer wire. The lungs demonstrate much improved  aeration with resolving pulmonary edema. There is a persistent right-sided pleural effusion and overlying atelectasis. IMPRESSION: Resolving pulmonary edema. Persistent small right pleural effusion and overlying atelectasis. Electronically Signed   By: Marijo Sanes M.D.   On: 07/14/2016 12:06   Dg Chest 2 View  Result Date: 07/03/2016 CLINICAL DATA:  Left foot wound EXAM: CHEST  2 VIEW COMPARISON:  06/20/2016 FINDINGS: Cardiac shadow remains enlarged. Pacing device is again seen and stable. Postsurgical changes are again noted and stable. Increasing right-sided effusion and underlying infiltrate/atelectasis is present. The left lung remains clear. No bony abnormality is seen. IMPRESSION: Increasing right-sided effusion and likely underlying infiltrate/atelectasis. Electronically Signed   By: Inez Catalina M.D.   On: 07/03/2016 16:05   Dg Chest Port 1 View  Result Date: 07/12/2016 CLINICAL DATA:  Congestive heart failure and hypoxia up. Pleural effusions. EXAM: PORTABLE CHEST 1 VIEW COMPARISON:  07/09/2016 FINDINGS: Previous median sternotomy and valve replacement. Single lead pacemaker appears unchanged. There is pulmonary venous hypertension with mild edema. This has probably improved slightly over the last 3 days. There is a moderate size right effusion with volume loss in the right lower lung. No pleural fluid seen on the left in this projection. IMPRESSION: Persistent congestive heart failure, but with slight improvement over the study 3 days ago. Persistent moderate size right effusion with volume loss in the right lower lung. Electronically Signed   By: Nelson Chimes M.D.   On: 07/12/2016 11:36   Dg Chest Port 1 View  Result Date: 07/09/2016 CLINICAL DATA:  Hypoxia EXAM: PORTABLE CHEST 1 VIEW COMPARISON:  07/04/2016 FINDINGS: Increased hazy density of the chest with interstitial coarsening. Moderate right pleural effusion, increased. Chronic cardiopericardial enlargement. Single chamber pacer from  the left. Previous median sternotomy with history of aortic valve replacement. IMPRESSION: CHF pattern including moderate right pleural effusion, progressed from 5 days ago. Electronically Signed   By: Monte Fantasia M.D.   On: 07/09/2016 10:34   Dg Foot Complete Left  Result Date: 07/03/2016 CLINICAL DATA:  82 year old male with a history of possible persisting osteomyelitis EXAM: LEFT FOOT - COMPLETE 3+ VIEW COMPARISON:  MRI 06/20/2016 plain film 05/20/2016 FINDINGS: Re- demonstration of prior left toe amputation. Compare to the prior plain film there is increasing erosive changes at the distal first metatarsal. Overlying skin/soft tissue ulceration with gauze dressing in place. Dense calcifications of the pedal vessels and visualized tibial vessels. No displaced fracture. Degenerative changes of the forefoot and midfoot. IMPRESSION: Compared to the prior plain film there appears to be progressing erosive changes at dorsal aspect of the distal first metatarsal, compatible with persisting osteomyelitis which was demonstrated on prior MRI. Soft tissue ulceration along the medial forefoot. Dense calcifications of the  tibial vessels and pedal vessels. Correlation with ankle-brachial index/noninvasive imaging may be useful to evaluate wound healing capability. Signed, Dulcy Fanny. Earleen Newport, DO [facility] Electronically Signed   By: Corrie Mckusick D.O.   On: 07/03/2016 16:09   US Thoracentesis Asp Pleural Space W/img Guide  Result Date: 07/04/2016 INDICATION: Patient with history of congestive heart failure and recent chest x-ray revealing a right-sided pleural effusion. Request made for diagnostic and therapeutic thoracentesis. EXAM: ULTRASOUND GUIDED DIAGNOSTIC AND THERAPEUTIC THORACENTESIS MEDICATIONS: 1% lidocaine. COMPLICATIONS: None immediate. PROCEDURE: An ultrasound guided thoracentesis was thoroughly discussed with the patient and questions answered.  The benefits, risks, alternatives and complications were also discussed. The patient understands and wishes to proceed with the procedure. Written consent was obtained. Ultrasound was performed to localize and mark an adequate pocket of fluid in the right chest. The area was then prepped and draped in the normal sterile fashion. 1% Lidocaine was used for local anesthesia. Under ultrasound guidance a Safe-T-Centesis catheter was introduced. Thoracentesis was performed. The catheter was removed and a dressing applied. FINDINGS: A total of approximately 1.8 L of clear yellow fluid was removed. Samples were sent to the laboratory as requested by the clinical team. IMPRESSION: Successful ultrasound guided right thoracentesis yielding 1.8 L of pleural fluid. The procedure was terminated secondary to hypotension. Read by: Saverio Danker, PA-C Electronically Signed   By: Sandi Mariscal M.D.   On: 07/04/2016 12:17    Microbiology: Recent Results (from the past 240 hour(s))  Surgical PCR screen     Status: None   Collection Time: 07/16/16  6:27 AM  Result Value Ref Range Status   MRSA, PCR NEGATIVE NEGATIVE Final   Staphylococcus aureus NEGATIVE NEGATIVE Final    Comment:        The Xpert SA Assay (FDA approved for NASAL specimens in patients over 20 years of age), is one component of a comprehensive surveillance program.  Test performance has been validated by [facility] for patients greater than or equal to 56 year old. It is not intended to diagnose infection nor to guide or monitor treatment.      Labs: Basic Metabolic Panel:  Recent Labs Lab 07/15/16 0500 07/17/16 0339 07/17/16 1241 07/19/16 0430 07/21/16 0234  NA 132* 135  --  133* 137  K 4.7 4.7  --  4.5 4.1  CL 92* 97*  --  96* 98*  CO2 32 30  --  29 31  GLUCOSE 139* 148*  --  141* 129*  BUN 37* 37*  --  63* 56*  CREATININE 1.31* 1.46*  --  1.34* 1.08  CALCIUM 10.5* 9.7  --  9.3 9.7  MG  --   --  1.8  --   --    Liver Function  Tests: No results for input(s): AST, ALT, ALKPHOS, BILITOT, PROT, ALBUMIN in the last 168 hours. No results for input(s): LIPASE, AMYLASE in the last 168 hours. No results for input(s): AMMONIA in the last 168 hours. CBC:  Recent Labs Lab 07/17/16 0339 07/18/16 0357 07/19/16 0430 07/20/16 0434 07/21/16 0234  WBC 14.1* 11.9* 12.1* 11.9* 12.8*  HGB 10.1* 9.7* 8.8* 8.2* 7.7*  HCT 31.7* 31.0* 28.3* 26.6* 24.8*  MCV 89.8 90.1 89.8 90.2 89.9  PLT 205 232 233 248 267   Cardiac Enzymes: No results for input(s): CKTOTAL, CKMB, CKMBINDEX, TROPONINI in the last 168 hours. BNP: BNP (last 3 results)  Recent Labs  07/03/16 1540  BNP 178.7*  ProBNP (last 3 results) No results for input(s): PROBNP in the last 8760 hours.  CBG:  Recent Labs Lab 07/20/16 1118 07/20/16 1645 07/20/16 2130 07/21/16 0641 07/21/16 1206  GLUCAP 165* 166* 118* 132* 129*       Signed:  Oswald Hillock MD.  [facility] 07/21/2016, 2:12 PM

## 2016-07-21 NOTE — Progress Notes (Signed)
Pt is alert and oriented with poor judgement uncooperative with using the urinal, up with standby assist.

## 2016-07-21 NOTE — Progress Notes (Signed)
Wife called and stated that she will not be able to come to the hospital to pick up belongings. PTAR called for transport to [facility]. Wife aware.

## 2016-07-21 NOTE — Progress Notes (Signed)
Patient stable. IV and telemetry removed. Awaiting PTAR. Attempted report to [facility] at this time. No response. RN to re attempt.

## 2016-07-21 NOTE — Progress Notes (Signed)
ANTICOAGULATION CONSULT NOTE  Pharmacy Consult:  Heparin and Warfarin Indication:  History of Afib  Allergies  Allergen Reactions  . Potassium-Containing Compounds Anaphylaxis    IV--loss of conciousness  . Metoprolol Succinate Other (See Comments)    Extremely tired    Patient Measurements: Height: 5\' 10"  (177.8 cm) Weight: 190 lb (86.2 kg) IBW/kg (Calculated) : 73 Heparin Dosing Weight: 88 kg  Vital Signs: Temp: 97.6 F (36.4 C) (01/29 0758) Temp Source: Oral (01/29 0758) BP: 114/46 (01/29 0758) Pulse Rate: 59 (01/29 0758)  Labs:  Recent Labs  07/19/16 0430 07/20/16 0434 07/21/16 0234  HGB 8.8* 8.2* 7.7*  HCT 28.3* 26.6* 24.8*  PLT 233 248 267  LABPROT 17.6* 19.9* 21.1*  INR 1.43 1.67 1.80  HEPARINUNFRC 0.45 0.36 0.36  CREATININE 1.34*  --  1.08    Estimated Creatinine Clearance: 57.3 mL/min (by C-G formula based on SCr of 1.08 mg/dL).  Assessment: 61 yoM on warfarin PTA for history of Afib, also with history of bioprosthetic AVR and LAA thrombus. Warfarin initially held on admit and bridged with heparin infusion, pt is now s/p angiogram 1/18 and OR 1/24 for debridement of 1st metatarsal on left. Pharmacy consulted to resume heparin post-op and also resumed warfarin 1/26.   Heparin level therapeutic at 0.36, INR continues trending up and is at 1.8. Hgb trending down but no S/Sx bleeding reported, pltc stable.   PTA Warfarin Dose: 7.5mg  Fri and 5mg  all other days  Goal of Therapy:  INR goal 2-3 Heparin level 0.3-0.7 units/ml Monitor platelets by anticoagulation protocol: Yes   Plan:  -Continue heparin drip at 1500 units/hr  -Warfarin 5 mg PO tonight -Daily heparin level/INR/CBC -Monitor for s/sx bleeding  Renold Genta, PharmD, BCPS Clinical Pharmacist Phone for today - [place] - [phone] 07/21/2016 8:37 AM

## 2016-07-21 NOTE — Progress Notes (Signed)
Physical Therapy Treatment Patient Details Name: [name]. MRN: [name] DOB: 1935/01/11 Today's Date: 07/21/2016    History of Present Illness Pt adm with Acute hypoxemic respiratory failure: Probably secondary to acute diastolic heart failure or pleural effusion. Pt also with osteomyelitis of lt foot and underwent resection of 1st metatarsal head and sesmoid bone and angioplasty of LLE vasculature on 07/16/16. PMH - chf, avr, cabg, afib, pacer, lt toe amputations, pvd./    PT Comments    Pt making slow, steady progress. Had once again taken O2 off and SpO2 85% when I entered room. Replaced O2. Pt continues to require assist for all mobility and continue to feel he needs SNF.  Follow Up Recommendations  SNF     Equipment Recommendations  None recommended by PT    Recommendations for Other Services       Precautions / Restrictions Precautions Precautions: Fall Required Braces or Orthoses: Other Brace/Splint Other Brace/Splint: Darco on lt foot when up Restrictions Weight Bearing Restrictions: Yes LLE Weight Bearing: Weight bearing as tolerated Other Position/Activity Restrictions: Through left heel with Darco shoe    Mobility  Bed Mobility Overal bed mobility: Needs Assistance Bed Mobility: Supine to Sit     Supine to sit: Min guard;HOB elevated     General bed mobility comments: Incr time and effort. Assist for safety  Transfers Overall transfer level: Needs assistance Equipment used: Rolling walker (2 wheeled) Transfers: Sit to/from Stand Sit to Stand: Min assist         General transfer comment: assist to bring hips up and for balance. Verbal cues for hand placement  Ambulation/Gait Ambulation/Gait assistance: Min assist Ambulation Distance (Feet): 25 Feet Assistive device: Rolling walker (2 wheeled) Gait Pattern/deviations: Drifts right/left;Wide base of support;Step-to pattern;Decreased step length - right;Decreased step length - left Gait  velocity: decr Gait velocity interpretation: Below normal speed for age/gender General Gait Details: assist for balance and support. Poor balance with Darco shoe.   Stairs            Wheelchair Mobility    Modified Rankin (Stroke Patients Only)       Balance Overall balance assessment: Needs assistance Sitting-balance support: Feet supported;No upper extremity supported Sitting balance-Leahy Scale: Fair Sitting balance - Comments: Pt requiring UE assist. Initial min A due to posterior lean   Standing balance support: Bilateral upper extremity supported Standing balance-Leahy Scale: Poor Standing balance comment: Walker and min A for static standing                    Cognition Arousal/Alertness: Awake/alert Behavior During Therapy: WFL for tasks assessed/performed Overall Cognitive Status: History of cognitive impairments - at baseline                      Exercises      General Comments        Pertinent Vitals/Pain      Home Living                      Prior Function            PT Goals (current goals can now be found in the care plan section) Progress towards PT goals: Progressing toward goals    Frequency    Min 2X/week      PT Plan Current plan remains appropriate;Frequency needs to be updated    Co-evaluation             End  of Session Equipment Utilized During Treatment: Oxygen Activity Tolerance: Patient tolerated treatment well Patient left: with call bell/phone within reach;in chair;with chair alarm set     Time: 1213-1228 PT Time Calculation (min) (ACUTE ONLY): 15 min  Charges:  $Gait Training: 8-22 mins                    G CodesShary Decamp May[place]ok 07/25/2016, 2:24 PM [employer] PT [phone]

## 2016-07-21 NOTE — Clinical Social Work Placement (Signed)
   CLINICAL SOCIAL WORK PLACEMENT  NOTE  Date:  07/21/2016  Patient Details  Name: [name]. MRN: [id number] Date of Birth: Sep 03, 1934  Clinical Social Work is seeking post-discharge placement for this patient at the [place] level of care (*CSW will initial, date and re-position this form in  chart as items are completed):  Yes   Patient/family provided with [facility] Work Department's list of facilities offering this level of care within the geographic area requested by the patient (or if unable, by the patient's family).  Yes   Patient/family informed of their freedom to choose among providers that offer the needed level of care, that participate in Medicare, Medicaid or managed care program needed by the patient, have an available bed and are willing to accept the patient.  Yes   Patient/family informed of [place]'s ownership interest in [facility] and [facility], as well as of the fact that they are under no obligation to receive care at these facilities.  PASRR submitted to EDS on 07/18/16     PASRR number received on       Existing PASRR number confirmed on 07/18/16     FL2 transmitted to all facilities in geographic area requested by pt/family on 07/18/16     FL2 transmitted to all facilities within larger geographic area on       Patient informed that his/her managed care company has contracts with or will negotiate with certain facilities, including the following:        Yes   Patient/family informed of bed offers received.  Patient chooses bed at [facility]     Physician recommends and patient chooses bed at      Patient to be transferred [facility] on 07/21/16.  Patient to be transferred to facility by PTAR     Patient family notified on 07/21/16 of transfer.  Name of family member notified:  [name]     PHYSICIAN Please prepare prescriptions     Additional Comment:     _______________________________________________ Candie Chroman, LCSW 07/21/2016, 3:19 PM

## 2016-07-21 NOTE — Progress Notes (Signed)
VASCULAR LAB PRELIMINARY  ARTERIAL  ABI completed:    RIGHT    LEFT    PRESSURE WAVEFORM  PRESSURE WAVEFORM  BRACHIAL 136 Triphasic BRACHIAL 119 Triphasic  DP >255 Dampened Monophasic DP >255 Dampened Monophasic  PT >255 Severely Dampened Monophasic PT 58 Biphasic  PER >255 Monophasic PER >255 Severely Dampened Monophasic    RIGHT LEFT  ABI N/A N/A except the PT 0.43   ABIs bilaterally could not be ascertained except in the left posterior tibial due to non compressible arteries possible due to calcification. The ABI in the left posterior tibial artery indicates a severe reduction in arterial flow however the signal was barely audible but Doppler signal and waveform appeared biphasic.  Jniyah Dantuono, RVS 07/21/2016, 10:37 AM

## 2016-07-21 NOTE — Progress Notes (Signed)
  Progress Note    07/21/2016 1:45 PM 5 Days Post-Op  Subjective:  Remains stable  Vitals:   07/21/16 0700 07/21/16 0758  BP: (!) 107/40 (!) 114/46  Pulse: 67 (!) 59  Resp:  18  Temp: 97.4 F (36.3 C) 97.6 F (36.4 C)    Physical Exam: Awake and alert Non labored respirations Left leg palpable popliteal   CBC    Component Value Date/Time   WBC 12.8 (H) 07/21/2016 0234   RBC 2.76 (L) 07/21/2016 0234   HGB 7.7 (L) 07/21/2016 0234   HGB 14.1 11/22/2010 0953   HCT 24.8 (L) 07/21/2016 0234   HCT 42.6 11/22/2010 0953   PLT 267 07/21/2016 0234   PLT 223 11/22/2010 0953   MCV 89.9 07/21/2016 0234   MCV 82 11/22/2010 0953   MCH 27.9 07/21/2016 0234   MCHC 31.0 07/21/2016 0234   RDW 16.1 (H) 07/21/2016 0234   RDW 16.3 (H) 11/22/2010 0953   LYMPHSABS 2.1 07/06/2016 0433   LYMPHSABS 2.4 11/22/2010 0953   MONOABS 1.0 07/06/2016 0433   EOSABS 0.2 07/06/2016 0433   EOSABS 0.4 11/22/2010 0953   BASOSABS 0.0 07/06/2016 0433   BASOSABS 0.1 11/22/2010 0953    BMET    Component Value Date/Time   NA 137 07/21/2016 0234   K 4.1 07/21/2016 0234   CL 98 (L) 07/21/2016 0234   CO2 31 07/21/2016 0234   GLUCOSE 129 (H) 07/21/2016 0234   GLUCOSE 229 08/07/2008 0000   BUN 56 (H) 07/21/2016 0234   CREATININE 1.08 07/21/2016 0234   CREATININE 1.17 03/19/2016 1534   CALCIUM 9.7 07/21/2016 0234   GFRNONAA >60 07/21/2016 0234   GFRAA >60 07/21/2016 0234    INR    Component Value Date/Time   INR 1.80 07/21/2016 0234   INR 1.7 09/02/2010 1323     Intake/Output Summary (Last 24 hours) at 07/21/16 1345 Last data filed at 07/21/16 1332  Gross per 24 hour  Intake             1660 ml  Output             1125 ml  Net              535 ml     Assessment:  82 y.o. male is s/p angiogram with revascularization of At, has inflow to ankle via left peroneal  Plan: [place] for discharge to snf when stable per primary Will need bid wound care Discussed with Vaughan Basta today   Brandon C.  Donzetta Matters, MD [facility] Office: [phone] Pager: [phone]  07/21/2016 1:45 PM

## 2016-07-21 NOTE — Progress Notes (Signed)
PT Cancellation Note  Patient Details Name: [name]. MRN: [id number] DOB: 1934-12-10   Cancelled Treatment:    Reason Eval/Treat Not Completed: Patient at procedure or test/unavailable. Will try again later.   Shary Decamp [facility] 07/21/2016, 9:39 AM Suanne Marker PT [phone]

## 2016-07-21 NOTE — Clinical Social Work Note (Addendum)
CSW facilitated patient discharge including contacting patient family and facility to confirm patient discharge plans. Clinical information faxed to facility and family agreeable with plan. RN to call PTAR when patient's wife arrives to collect his belongings [phone] if before 5:00 and [phone] if after 5:00). Patient's wife will call when she is on the way. RN to call report prior to discharge [phone]).  CSW will sign off for now as social work intervention is no longer needed. Please consult [place] again if new needs arise.  [name], [place]

## 2016-07-22 ENCOUNTER — Other Ambulatory Visit: Payer: Self-pay

## 2016-07-22 ENCOUNTER — Encounter: Payer: Self-pay | Admitting: Adult Health

## 2016-07-22 ENCOUNTER — Non-Acute Institutional Stay (SKILLED_NURSING_FACILITY): Payer: Medicare Other | Admitting: Adult Health

## 2016-07-22 DIAGNOSIS — J90 Pleural effusion, not elsewhere classified: Secondary | ICD-10-CM | POA: Diagnosis not present

## 2016-07-22 DIAGNOSIS — D62 Acute posthemorrhagic anemia: Secondary | ICD-10-CM

## 2016-07-22 DIAGNOSIS — G309 Alzheimer's disease, unspecified: Secondary | ICD-10-CM

## 2016-07-22 DIAGNOSIS — L97509 Non-pressure chronic ulcer of other part of unspecified foot with unspecified severity: Secondary | ICD-10-CM

## 2016-07-22 DIAGNOSIS — I5031 Acute diastolic (congestive) heart failure: Secondary | ICD-10-CM

## 2016-07-22 DIAGNOSIS — B372 Candidiasis of skin and nail: Secondary | ICD-10-CM

## 2016-07-22 DIAGNOSIS — E11621 Type 2 diabetes mellitus with foot ulcer: Secondary | ICD-10-CM | POA: Diagnosis not present

## 2016-07-22 DIAGNOSIS — R5381 Other malaise: Secondary | ICD-10-CM

## 2016-07-22 DIAGNOSIS — I10 Essential (primary) hypertension: Secondary | ICD-10-CM

## 2016-07-22 DIAGNOSIS — I482 Chronic atrial fibrillation, unspecified: Secondary | ICD-10-CM

## 2016-07-22 DIAGNOSIS — I2510 Atherosclerotic heart disease of native coronary artery without angina pectoris: Secondary | ICD-10-CM | POA: Diagnosis not present

## 2016-07-22 DIAGNOSIS — R791 Abnormal coagulation profile: Secondary | ICD-10-CM | POA: Diagnosis not present

## 2016-07-22 DIAGNOSIS — M869 Osteomyelitis, unspecified: Secondary | ICD-10-CM

## 2016-07-22 DIAGNOSIS — F0280 Dementia in other diseases classified elsewhere without behavioral disturbance: Secondary | ICD-10-CM

## 2016-07-22 DIAGNOSIS — E1169 Type 2 diabetes mellitus with other specified complication: Secondary | ICD-10-CM

## 2016-07-22 NOTE — Progress Notes (Signed)
DATE:  07/22/2016   MRN:  [name]  BIRTHDAY: [birth year]  Facility:  Nursing Home Location:  [place] Room Number: [id number]  LEVEL OF CARE:  SNF [phone])  Contact Information    Name Relation Home Work [place] Spouse [phone]  [phone]   Sparks,Cheryl Daughter   [phone]   Salli Quarry Daughter   [phone]       Code Status History    Date Active Date Inactive Code Status Order ID Comments User Context   07/03/2016  6:35 PM 07/22/2016  1:34 AM Full Code [id number]  Jonetta Osgood, MD ED   06/06/2016  6:44 PM 06/08/2016  3:11 AM Full Code [id number]  Thompson Grayer, MD Inpatient   06/06/2016  6:43 PM 06/06/2016  6:44 PM Full Code [id number]  Thompson Grayer, MD Inpatient   06/02/2014  6:36 PM 06/07/2014  4:52 PM Full Code [id number]  Charlynne Cousins, MD Inpatient       Chief Complaint  Patient presents with  . Hospitalization Follow-up    HISTORY OF PRESENT ILLNESS:  This is a 82-YO male seen for hospital follow-up.  He was admitted to [facility] on 07/21/2016 for short-term rehabilitation following an admission at [facility] 07/03/2016-07/21/2016 for hypoxia.  He was found to have acute on chronic diastolic heart failure and was started on diuretics. He was found to have right pleural effusion and had thoracentesis on 07/04/16 with removal of 1.8L of yellow fluid. A repeat CXR showed improvement. He was treated for groin candida dermatitis with fluconazole and nystatin powder. He has atrial fibrillation and was restarted on Coumadin.    PAST MEDICAL HISTORY:  Past Medical History:  Diagnosis Date  . Aortal stenosis 2006   biophrostetic  . Arthritis   . Atrial fibrillation ([place])    onset after CABG--- coumadin management at Coumadin clinic  . Carotid artery disease ([place]) 09/27/2010   Carotid US 12/17: bilat ICA 40-59 >> 1 year FU  . CHF (congestive heart failure) ([place])    abter CABG  . Complete  heart block ([place])   . Coronary artery disease   . DM type 2 (diabetes mellitus, type 2) ([place])    used to see Dr Meredith Pel, now f/u by Dr Larose Kells  . Erectile dysfunction   . Gangrene ([place])   . History of nephrolithiasis   . HTN (hypertension)   . Hyperlipidemia   . Memory loss   . Obesity   . OSA (obstructive sleep apnea)    not using CPAP  . Pacemaker 2/07   VVI  . PVD (peripheral vascular disease) ([name])    CT angio showed- vascular insuff, intestine and RAS bilaterally, andgiogram 02/2009 severe PVD medical management  . S/P aortic valve replacement   . Sick sinus syndrome ([name])    [name]  . Skin cancer    surgery (nose) 09-2010     CURRENT MEDICATIONS: Reviewed  Patient's Medications  New Prescriptions   No medications on file  Previous Medications   ACETAMINOPHEN (TYLENOL) 325 MG TABLET    Take 2 tablets (650 mg total) by mouth every 4 (four) hours as needed for headache or mild pain.   ASPIRIN EC 81 MG EC TABLET    Take 1 tablet (81 mg total) by mouth daily.   DONEPEZIL (ARICEPT) 10 MG TABLET    Take 1 tablet (10 mg total) by mouth at bedtime.   EZETIMIBE-SIMVASTATIN (VYTORIN) 10-40 MG PER TABLET  Take 1 tablet by mouth at bedtime.   FUROSEMIDE (LASIX) 40 MG TABLET    Take 1 tablet (40 mg total) by mouth 2 (two) times daily.   INSULIN ASPART (NOVOLOG) 100 UNIT/ML INJECTION    Inject 0-9 Units into the skin 3 (three) times daily with meals. Sliding scale insulin Less than 70 initiate hypoglycemia protocol 70-120  0 units 120-150 1 unit 151-200 2 units 201-250 3 units 251-300 5 units 301-350 7 units 351-400 9 units  Greater than 400 call MD   INSULIN GLARGINE (LANTUS) 100 UNIT/ML INJECTION    Inject 0.15 mLs (15 Units total) into the skin at bedtime.   LISINOPRIL (PRINIVIL,ZESTRIL) 10 MG TABLET    Take 1 tablet (10 mg total) by mouth daily.   NUTRITIONAL SUPPLEMENT LIQD    Take 120 mLs by mouth daily.   NYSTATIN (MYCOSTATIN/NYSTOP) POWDER    Apply topically  3 (three) times daily. Apply to groin daily   WARFARIN (COUMADIN) 5 MG TABLET    Take as directed by coumadin clinic  Modified Medications   No medications on file  Discontinued Medications   FEEDING SUPPLEMENT, ENSURE ENLIVE, (ENSURE ENLIVE) LIQD    Take 237 mLs by mouth daily.     Allergies  Allergen Reactions  . Potassium-Containing Compounds Anaphylaxis    IV--loss of conciousness  . Metoprolol Succinate Other (See Comments)    Extremely tired     REVIEW OF SYSTEMS:  GENERAL: no change in appetite, no fatigue, no weight changes, no fever, chills or weakness EYES: Denies change in vision, dry eyes, eye pain, itching or discharge EARS: Denies change in hearing, ringing in ears, or earache NOSE: Denies nasal congestion or epistaxis MOUTH and THROAT: Denies oral discomfort, gingival pain or bleeding, pain from teeth or hoarseness   RESPIRATORY: no cough, SOB, DOE, wheezing, hemoptysis CARDIAC: no chest pain, edema or palpitations GI: no abdominal pain, diarrhea, constipation, heart burn, nausea or vomiting GU: Denies dysuria, frequency, hematuria, incontinence, or discharge PSYCHIATRIC: Denies feeling of depression or anxiety. No report of hallucinations, insomnia, paranoia, or agitation    PHYSICAL EXAMINATION  GENERAL APPEARANCE: Well nourished. In no acute distress. Normal body habitus SKIN:  Left foot wound with dry dressing HEAD: Normal in size and contour. No evidence of trauma EYES: Lids open and close normally. No blepharitis, entropion or ectropion. PERRL. Conjunctivae are clear and sclerae are white. Lenses are without opacity EARS: Pinnae are normal. Patient hears normal voice tunes of the examiner MOUTH and THROAT: Lips are without lesions. Oral mucosa is moist and without lesions. Tongue is normal in shape, size, and color and without lesions NECK: supple, trachea midline, no neck masses, no thyroid tenderness, no thyromegaly LYMPHATICS: no LAN in the neck, no  supraclavicular LAN RESPIRATORY: breathing is even & unlabored, BS CTAB CARDIAC: RRR, no murmur,no extra heart sounds, no edema, has left chest pacemaker GI: abdomen soft, normal BS, no masses, no tenderness, no hepatomegaly, no splenomegaly EXTREMITIES:   Able to move X 4 extremities PSYCHIATRIC: Alert to self, disoriented to time and place. Affect and behavior are appropriate   LABS/RADIOLOGY: Labs reviewed: Basic Metabolic Panel:  Recent Labs  07/17/16 0339 07/17/16 1241 07/19/16 0430 07/21/16 0234  NA 135  --  133* 137  K 4.7  --  4.5 4.1  CL 97*  --  96* 98*  CO2 30  --  29 31  GLUCOSE 148*  --  141* 129*  BUN 37*  --  63* 56*  CREATININE  1.46*  --  1.34* 1.08  CALCIUM 9.7  --  9.3 9.7  MG  --  1.8  --   --    Liver Function Tests:  Recent Labs  07/04/16 0502  AST 29  ALT 25  ALKPHOS 129*  BILITOT 0.8  PROT 6.8  ALBUMIN 2.5*   CBC:  Recent Labs  06/20/16 1518 07/03/16 1540  07/06/16 0433  07/19/16 0430 07/20/16 0434 07/21/16 0234  WBC 9.4 8.3  < > 12.9*  < > 12.1* 11.9* 12.8*  NEUTROABS 7.2 5.9  --  9.6*  --   --   --   --   HGB 13.5 12.0*  < > 12.3*  < > 8.8* 8.2* 7.7*  HCT 42.0 37.3*  < > 37.6*  < > 28.3* 26.6* 24.8*  MCV 89.7 87.1  < > 87.9  < > 89.8 90.2 89.9  PLT 175 203  < > 228  < > 233 248 267  < > = values in this interval not displayed. Lipid Panel:  Recent Labs  03/19/16 1534  HDL 36*   CBG:  Recent Labs  07/21/16 0641 07/21/16 1206 07/21/16 1721  GLUCAP 132* 129* 155*      Dg Chest 1 View  Result Date: 07/04/2016 CLINICAL DATA:  Right-sided thoracentesis. EXAM: CHEST 1 VIEW COMPARISON:  07/04/2015 FINDINGS: Small right pleural effusion. No right pneumothorax. Left lung is clear. Stable cardiomediastinal silhouette. Prior CABG. Single lead cardiac pacemaker. No acute osseous abnormality. IMPRESSION: 1. Small right pleural effusion. No right pneumothorax status post right thoracentesis. Electronically Signed   By: Kathreen Devoid   On: 07/04/2016 11:03   Dg Chest 2 View  Result Date: 07/14/2016 CLINICAL DATA:  Shortness of breath. EXAM: CHEST  2 VIEW COMPARISON:  07/12/2016 FINDINGS: The heart is mildly enlarged but stable. Stable right ventricular pacer wire. The lungs demonstrate much improved aeration with resolving pulmonary edema. There is a persistent right-sided pleural effusion and overlying atelectasis. IMPRESSION: Resolving pulmonary edema. Persistent small right pleural effusion and overlying atelectasis. Electronically Signed   By: Marijo Sanes M.D.   On: 07/14/2016 12:06   Dg Chest 2 View  Result Date: 07/03/2016 CLINICAL DATA:  Left foot wound EXAM: CHEST  2 VIEW COMPARISON:  06/20/2016 FINDINGS: Cardiac shadow remains enlarged. Pacing device is again seen and stable. Postsurgical changes are again noted and stable. Increasing right-sided effusion and underlying infiltrate/atelectasis is present. The left lung remains clear. No bony abnormality is seen. IMPRESSION: Increasing right-sided effusion and likely underlying infiltrate/atelectasis. Electronically Signed   By: Inez Catalina M.D.   On: 07/03/2016 16:05   Dg Chest Port 1 View  Result Date: 07/12/2016 CLINICAL DATA:  Congestive heart failure and hypoxia up. Pleural effusions. EXAM: PORTABLE CHEST 1 VIEW COMPARISON:  07/09/2016 FINDINGS: Previous median sternotomy and valve replacement. Single lead pacemaker appears unchanged. There is pulmonary venous hypertension with mild edema. This has probably improved slightly over the last 3 days. There is a moderate size right effusion with volume loss in the right lower lung. No pleural fluid seen on the left in this projection. IMPRESSION: Persistent congestive heart failure, but with slight improvement over the study 3 days ago. Persistent moderate size right effusion with volume loss in the right lower lung. Electronically Signed   By: Nelson Chimes M.D.   On: 07/12/2016 11:36   Dg Chest Port 1  View  Result Date: 07/09/2016 CLINICAL DATA:  Hypoxia EXAM: PORTABLE CHEST 1 VIEW COMPARISON:  07/04/2016 FINDINGS:  Increased hazy density of the chest with interstitial coarsening. Moderate right pleural effusion, increased. Chronic cardiopericardial enlargement. Single chamber pacer from the left. Previous median sternotomy with history of aortic valve replacement. IMPRESSION: CHF pattern including moderate right pleural effusion, progressed from 5 days ago. Electronically Signed   By: Monte Fantasia M.D.   On: 07/09/2016 10:34   Dg Foot Complete Left  Result Date: 07/03/2016 CLINICAL DATA:  82 year old male with a history of possible persisting osteomyelitis EXAM: LEFT FOOT - COMPLETE 3+ VIEW COMPARISON:  MRI 06/20/2016 plain film 05/20/2016 FINDINGS: Re- demonstration of prior left toe amputation. Compare to the prior plain film there is increasing erosive changes at the distal first metatarsal. Overlying skin/soft tissue ulceration with gauze dressing in place. Dense calcifications of the pedal vessels and visualized tibial vessels. No displaced fracture. Degenerative changes of the forefoot and midfoot. IMPRESSION: Compared to the prior plain film there appears to be progressing erosive changes at dorsal aspect of the distal first metatarsal, compatible with persisting osteomyelitis which was demonstrated on prior MRI. Soft tissue ulceration along the medial forefoot. Dense calcifications of the tibial vessels and pedal vessels. Correlation with ankle-brachial index/noninvasive imaging may be useful to evaluate wound healing capability. Signed, Dulcy Fanny. Earleen Newport, DO [facility] Electronically Signed   By: Corrie Mckusick D.O.   On: 07/03/2016 16:09   US Thoracentesis Asp Pleural Space W/img Guide  Result Date: 07/04/2016 INDICATION: Patient with history of congestive heart failure and recent chest x-ray revealing a right-sided pleural effusion.  Request made for diagnostic and therapeutic thoracentesis. EXAM: ULTRASOUND GUIDED DIAGNOSTIC AND THERAPEUTIC THORACENTESIS MEDICATIONS: 1% lidocaine. COMPLICATIONS: None immediate. PROCEDURE: An ultrasound guided thoracentesis was thoroughly discussed with the patient and questions answered. The benefits, risks, alternatives and complications were also discussed. The patient understands and wishes to proceed with the procedure. Written consent was obtained. Ultrasound was performed to localize and mark an adequate pocket of fluid in the right chest. The area was then prepped and draped in the normal sterile fashion. 1% Lidocaine was used for local anesthesia. Under ultrasound guidance a Safe-T-Centesis catheter was introduced. Thoracentesis was performed. The catheter was removed and a dressing applied. FINDINGS: A total of approximately 1.8 L of clear yellow fluid was removed. Samples were sent to the laboratory as requested by the clinical team. IMPRESSION: Successful ultrasound guided right thoracentesis yielding 1.8 L of pleural fluid. The procedure was terminated secondary to hypotension. Read by: Saverio Danker, PA-C Electronically Signed   By: Sandi Mariscal M.D.   On: 07/04/2016 12:17    ASSESSMENT/PLAN:  Physical deconditioning - for rehabilitation, PT and OT for therapeutic strengthening exercises; fall precautions  Acute on chronic diastolic CHF - EF 0000000; was started on IV Lasix then transitioned to oral Lasix; will continue Lasix 40 mg BID; check BMP; continue lisinopril 10 mg 1 tab by mouth daily  Right pleural effusion - S/P thoracentesis on 07/04/16 with removal of 1.8L yellow fluid; repeat CXR showed improvement; will continue Lasix  Diabetic foot/Osteomyelitis of left first metatarsal - ID and Vascular surgery consulted; S/P angiogram with revascularization on 07/10/16; he has completed antibiotics in the hospital;  follow-up with [facility]; continue wound treatment  daily  Candida dermatitis of the groin - continue Nystatin 100,000 units/gm powder to groin rashes TID  Hypertension - continue lisinopril 10 mg 1 tab by mouth daily  Alzheimer's Dementia - continue Aricept 10 mg 1 tab by mouth daily at bedtime  Hyperlipidemia - continue Vytorin 10-40 mg 1 tab by mouth daily at bedtime  Diabetes mellitus, type II - continue Lantus 100 units/mL inject 15 units subcutaneous daily at bedtime and NovoLog sliding scale before meals Lab Results  Component Value Date   HGBA1C 7.4 (H) 07/06/2016   Long-term use of anticoagulant - INR 3.1, supratherapeutic; hold Coumadin and check INR on 07/23/16  Anemia, acute blood - hgb 7.7 ; check CBC  CAD S/P CABG - no SOB nor chest  ; Vytorin 10-40 mg 1 tab by mouth daily at bedtime aspirin EC 81 mg 1 tab by mouth daily  Chronic atrial fibrillation - rate-controlled; on Coumadin     Goals of care:  Short-term rehabilitation     Monina C. McClain - NP [employer] [phone]

## 2016-07-24 ENCOUNTER — Inpatient Hospital Stay (HOSPITAL_COMMUNITY)
Admission: EM | Admit: 2016-07-24 | Discharge: 2016-08-09 | DRG: 981 | Disposition: A | Discharge status: Skilled Nursing Facility | Payer: Medicare Other | Attending: Internal Medicine | Admitting: Internal Medicine

## 2016-07-24 ENCOUNTER — Emergency Department (HOSPITAL_COMMUNITY): Payer: Medicare Other

## 2016-07-24 ENCOUNTER — Encounter (HOSPITAL_COMMUNITY): Payer: Self-pay | Admitting: Emergency Medicine

## 2016-07-24 DIAGNOSIS — Z951 Presence of aortocoronary bypass graft: Secondary | ICD-10-CM | POA: Diagnosis not present

## 2016-07-24 DIAGNOSIS — G4733 Obstructive sleep apnea (adult) (pediatric): Secondary | ICD-10-CM | POA: Diagnosis present

## 2016-07-24 DIAGNOSIS — E875 Hyperkalemia: Secondary | ICD-10-CM | POA: Diagnosis present

## 2016-07-24 DIAGNOSIS — Z8719 Personal history of other diseases of the digestive system: Secondary | ICD-10-CM

## 2016-07-24 DIAGNOSIS — Z89412 Acquired absence of left great toe: Secondary | ICD-10-CM

## 2016-07-24 DIAGNOSIS — S42301A Unspecified fracture of shaft of humerus, right arm, initial encounter for closed fracture: Secondary | ICD-10-CM | POA: Diagnosis present

## 2016-07-24 DIAGNOSIS — E1169 Type 2 diabetes mellitus with other specified complication: Secondary | ICD-10-CM | POA: Diagnosis present

## 2016-07-24 DIAGNOSIS — I959 Hypotension, unspecified: Secondary | ICD-10-CM | POA: Diagnosis present

## 2016-07-24 DIAGNOSIS — D649 Anemia, unspecified: Secondary | ICD-10-CM | POA: Diagnosis not present

## 2016-07-24 DIAGNOSIS — E785 Hyperlipidemia, unspecified: Secondary | ICD-10-CM | POA: Diagnosis present

## 2016-07-24 DIAGNOSIS — F0280 Dementia in other diseases classified elsewhere without behavioral disturbance: Secondary | ICD-10-CM | POA: Diagnosis present

## 2016-07-24 DIAGNOSIS — J9621 Acute and chronic respiratory failure with hypoxia: Secondary | ICD-10-CM | POA: Diagnosis not present

## 2016-07-24 DIAGNOSIS — L97512 Non-pressure chronic ulcer of other part of right foot with fat layer exposed: Secondary | ICD-10-CM | POA: Diagnosis not present

## 2016-07-24 DIAGNOSIS — Z79899 Other long term (current) drug therapy: Secondary | ICD-10-CM | POA: Diagnosis not present

## 2016-07-24 DIAGNOSIS — J989 Respiratory disorder, unspecified: Secondary | ICD-10-CM | POA: Diagnosis not present

## 2016-07-24 DIAGNOSIS — K259 Gastric ulcer, unspecified as acute or chronic, without hemorrhage or perforation: Secondary | ICD-10-CM | POA: Diagnosis not present

## 2016-07-24 DIAGNOSIS — Z794 Long term (current) use of insulin: Secondary | ICD-10-CM

## 2016-07-24 DIAGNOSIS — Z95820 Peripheral vascular angioplasty status with implants and grafts: Secondary | ICD-10-CM

## 2016-07-24 DIAGNOSIS — K922 Gastrointestinal hemorrhage, unspecified: Secondary | ICD-10-CM | POA: Diagnosis not present

## 2016-07-24 DIAGNOSIS — R195 Other fecal abnormalities: Secondary | ICD-10-CM | POA: Diagnosis not present

## 2016-07-24 DIAGNOSIS — Z6827 Body mass index (BMI) 27.0-27.9, adult: Secondary | ICD-10-CM

## 2016-07-24 DIAGNOSIS — R488 Other symbolic dysfunctions: Secondary | ICD-10-CM | POA: Diagnosis not present

## 2016-07-24 DIAGNOSIS — I130 Hypertensive heart and chronic kidney disease with heart failure and stage 1 through stage 4 chronic kidney disease, or unspecified chronic kidney disease: Secondary | ICD-10-CM | POA: Diagnosis present

## 2016-07-24 DIAGNOSIS — N183 Chronic kidney disease, stage 3 (moderate): Secondary | ICD-10-CM | POA: Diagnosis not present

## 2016-07-24 DIAGNOSIS — I5032 Chronic diastolic (congestive) heart failure: Secondary | ICD-10-CM | POA: Diagnosis not present

## 2016-07-24 DIAGNOSIS — D638 Anemia in other chronic diseases classified elsewhere: Secondary | ICD-10-CM | POA: Diagnosis present

## 2016-07-24 DIAGNOSIS — S4991XA Unspecified injury of right shoulder and upper arm, initial encounter: Secondary | ICD-10-CM | POA: Diagnosis present

## 2016-07-24 DIAGNOSIS — D509 Iron deficiency anemia, unspecified: Secondary | ICD-10-CM | POA: Diagnosis present

## 2016-07-24 DIAGNOSIS — K2960 Other gastritis without bleeding: Secondary | ICD-10-CM | POA: Diagnosis present

## 2016-07-24 DIAGNOSIS — S42211A Unspecified displaced fracture of surgical neck of right humerus, initial encounter for closed fracture: Secondary | ICD-10-CM

## 2016-07-24 DIAGNOSIS — R296 Repeated falls: Secondary | ICD-10-CM | POA: Diagnosis not present

## 2016-07-24 DIAGNOSIS — I252 Old myocardial infarction: Secondary | ICD-10-CM

## 2016-07-24 DIAGNOSIS — G309 Alzheimer's disease, unspecified: Secondary | ICD-10-CM | POA: Diagnosis not present

## 2016-07-24 DIAGNOSIS — Z9981 Dependence on supplemental oxygen: Secondary | ICD-10-CM

## 2016-07-24 DIAGNOSIS — J9691 Respiratory failure, unspecified with hypoxia: Secondary | ICD-10-CM | POA: Diagnosis not present

## 2016-07-24 DIAGNOSIS — E11621 Type 2 diabetes mellitus with foot ulcer: Secondary | ICD-10-CM | POA: Diagnosis present

## 2016-07-24 DIAGNOSIS — S199XXA Unspecified injury of neck, initial encounter: Secondary | ICD-10-CM | POA: Diagnosis not present

## 2016-07-24 DIAGNOSIS — I472 Ventricular tachycardia: Secondary | ICD-10-CM | POA: Diagnosis not present

## 2016-07-24 DIAGNOSIS — Z9889 Other specified postprocedural states: Secondary | ICD-10-CM

## 2016-07-24 DIAGNOSIS — S42211D Unspecified displaced fracture of surgical neck of right humerus, subsequent encounter for fracture with routine healing: Secondary | ICD-10-CM | POA: Diagnosis not present

## 2016-07-24 DIAGNOSIS — D689 Coagulation defect, unspecified: Secondary | ICD-10-CM | POA: Diagnosis present

## 2016-07-24 DIAGNOSIS — Z87891 Personal history of nicotine dependence: Secondary | ICD-10-CM

## 2016-07-24 DIAGNOSIS — I4729 Other ventricular tachycardia: Secondary | ICD-10-CM

## 2016-07-24 DIAGNOSIS — T8789 Other complications of amputation stump: Secondary | ICD-10-CM | POA: Diagnosis not present

## 2016-07-24 DIAGNOSIS — R0603 Acute respiratory distress: Secondary | ICD-10-CM

## 2016-07-24 DIAGNOSIS — Y92129 Unspecified place in nursing home as the place of occurrence of the external cause: Secondary | ICD-10-CM

## 2016-07-24 DIAGNOSIS — W06XXXA Fall from bed, initial encounter: Secondary | ICD-10-CM | POA: Diagnosis present

## 2016-07-24 DIAGNOSIS — Z950 Presence of cardiac pacemaker: Secondary | ICD-10-CM | POA: Diagnosis not present

## 2016-07-24 DIAGNOSIS — I493 Ventricular premature depolarization: Secondary | ICD-10-CM | POA: Diagnosis not present

## 2016-07-24 DIAGNOSIS — I110 Hypertensive heart disease with heart failure: Secondary | ICD-10-CM | POA: Diagnosis not present

## 2016-07-24 DIAGNOSIS — Z7982 Long term (current) use of aspirin: Secondary | ICD-10-CM | POA: Diagnosis not present

## 2016-07-24 DIAGNOSIS — T8189XA Other complications of procedures, not elsewhere classified, initial encounter: Secondary | ICD-10-CM | POA: Diagnosis present

## 2016-07-24 DIAGNOSIS — E669 Obesity, unspecified: Secondary | ICD-10-CM | POA: Diagnosis present

## 2016-07-24 DIAGNOSIS — S42214A Unspecified nondisplaced fracture of surgical neck of right humerus, initial encounter for closed fracture: Secondary | ICD-10-CM | POA: Diagnosis not present

## 2016-07-24 DIAGNOSIS — K250 Acute gastric ulcer with hemorrhage: Secondary | ICD-10-CM | POA: Diagnosis not present

## 2016-07-24 DIAGNOSIS — I482 Chronic atrial fibrillation, unspecified: Secondary | ICD-10-CM | POA: Diagnosis present

## 2016-07-24 DIAGNOSIS — L97523 Non-pressure chronic ulcer of other part of left foot with necrosis of muscle: Secondary | ICD-10-CM | POA: Diagnosis present

## 2016-07-24 DIAGNOSIS — I2721 Secondary pulmonary arterial hypertension: Secondary | ICD-10-CM | POA: Diagnosis present

## 2016-07-24 DIAGNOSIS — I509 Heart failure, unspecified: Secondary | ICD-10-CM | POA: Diagnosis not present

## 2016-07-24 DIAGNOSIS — E1165 Type 2 diabetes mellitus with hyperglycemia: Secondary | ICD-10-CM

## 2016-07-24 DIAGNOSIS — S98112A Complete traumatic amputation of left great toe, initial encounter: Secondary | ICD-10-CM | POA: Diagnosis not present

## 2016-07-24 DIAGNOSIS — J449 Chronic obstructive pulmonary disease, unspecified: Secondary | ICD-10-CM | POA: Diagnosis present

## 2016-07-24 DIAGNOSIS — J90 Pleural effusion, not elsewhere classified: Secondary | ICD-10-CM

## 2016-07-24 DIAGNOSIS — Y939 Activity, unspecified: Secondary | ICD-10-CM | POA: Diagnosis not present

## 2016-07-24 DIAGNOSIS — E1151 Type 2 diabetes mellitus with diabetic peripheral angiopathy without gangrene: Secondary | ICD-10-CM | POA: Diagnosis not present

## 2016-07-24 DIAGNOSIS — Z89429 Acquired absence of other toe(s), unspecified side: Secondary | ICD-10-CM

## 2016-07-24 DIAGNOSIS — E119 Type 2 diabetes mellitus without complications: Secondary | ICD-10-CM | POA: Diagnosis not present

## 2016-07-24 DIAGNOSIS — J9601 Acute respiratory failure with hypoxia: Secondary | ICD-10-CM | POA: Diagnosis not present

## 2016-07-24 DIAGNOSIS — I2510 Atherosclerotic heart disease of native coronary artery without angina pectoris: Secondary | ICD-10-CM | POA: Diagnosis not present

## 2016-07-24 DIAGNOSIS — X58XXXA Exposure to other specified factors, initial encounter: Secondary | ICD-10-CM | POA: Diagnosis not present

## 2016-07-24 DIAGNOSIS — Y929 Unspecified place or not applicable: Secondary | ICD-10-CM | POA: Diagnosis not present

## 2016-07-24 DIAGNOSIS — R2689 Other abnormalities of gait and mobility: Secondary | ICD-10-CM | POA: Diagnosis not present

## 2016-07-24 DIAGNOSIS — Z953 Presence of xenogenic heart valve: Secondary | ICD-10-CM

## 2016-07-24 DIAGNOSIS — J942 Hemothorax: Secondary | ICD-10-CM

## 2016-07-24 DIAGNOSIS — M25511 Pain in right shoulder: Secondary | ICD-10-CM | POA: Diagnosis not present

## 2016-07-24 DIAGNOSIS — IMO0002 Reserved for concepts with insufficient information to code with codable children: Secondary | ICD-10-CM | POA: Diagnosis present

## 2016-07-24 DIAGNOSIS — R262 Difficulty in walking, not elsewhere classified: Secondary | ICD-10-CM

## 2016-07-24 DIAGNOSIS — I5031 Acute diastolic (congestive) heart failure: Secondary | ICD-10-CM | POA: Diagnosis not present

## 2016-07-24 DIAGNOSIS — Z7901 Long term (current) use of anticoagulants: Secondary | ICD-10-CM

## 2016-07-24 DIAGNOSIS — R0600 Dyspnea, unspecified: Secondary | ICD-10-CM

## 2016-07-24 DIAGNOSIS — D62 Acute posthemorrhagic anemia: Secondary | ICD-10-CM | POA: Diagnosis not present

## 2016-07-24 DIAGNOSIS — J811 Chronic pulmonary edema: Secondary | ICD-10-CM | POA: Diagnosis not present

## 2016-07-24 DIAGNOSIS — D500 Iron deficiency anemia secondary to blood loss (chronic): Secondary | ICD-10-CM | POA: Diagnosis not present

## 2016-07-24 DIAGNOSIS — R278 Other lack of coordination: Secondary | ICD-10-CM | POA: Diagnosis not present

## 2016-07-24 DIAGNOSIS — T148XXA Other injury of unspecified body region, initial encounter: Secondary | ICD-10-CM | POA: Diagnosis not present

## 2016-07-24 DIAGNOSIS — E1122 Type 2 diabetes mellitus with diabetic chronic kidney disease: Secondary | ICD-10-CM | POA: Diagnosis not present

## 2016-07-24 DIAGNOSIS — T45515A Adverse effect of anticoagulants, initial encounter: Secondary | ICD-10-CM | POA: Diagnosis present

## 2016-07-24 DIAGNOSIS — K5900 Constipation, unspecified: Secondary | ICD-10-CM | POA: Diagnosis not present

## 2016-07-24 DIAGNOSIS — R627 Adult failure to thrive: Secondary | ICD-10-CM | POA: Diagnosis present

## 2016-07-24 DIAGNOSIS — W19XXXA Unspecified fall, initial encounter: Secondary | ICD-10-CM

## 2016-07-24 DIAGNOSIS — S299XXA Unspecified injury of thorax, initial encounter: Secondary | ICD-10-CM | POA: Diagnosis not present

## 2016-07-24 DIAGNOSIS — J9611 Chronic respiratory failure with hypoxia: Secondary | ICD-10-CM | POA: Diagnosis not present

## 2016-07-24 DIAGNOSIS — R791 Abnormal coagulation profile: Secondary | ICD-10-CM | POA: Diagnosis not present

## 2016-07-24 DIAGNOSIS — I70202 Unspecified atherosclerosis of native arteries of extremities, left leg: Secondary | ICD-10-CM | POA: Diagnosis present

## 2016-07-24 DIAGNOSIS — R52 Pain, unspecified: Secondary | ICD-10-CM

## 2016-07-24 DIAGNOSIS — M6281 Muscle weakness (generalized): Secondary | ICD-10-CM | POA: Diagnosis not present

## 2016-07-24 DIAGNOSIS — R269 Unspecified abnormalities of gait and mobility: Secondary | ICD-10-CM | POA: Diagnosis not present

## 2016-07-24 DIAGNOSIS — Z952 Presence of prosthetic heart valve: Secondary | ICD-10-CM

## 2016-07-24 DIAGNOSIS — Y999 Unspecified external cause status: Secondary | ICD-10-CM | POA: Diagnosis not present

## 2016-07-24 DIAGNOSIS — Z888 Allergy status to other drugs, medicaments and biological substances status: Secondary | ICD-10-CM

## 2016-07-24 DIAGNOSIS — T457X5A Adverse effect of anticoagulant antagonists, vitamin K and other coagulants, initial encounter: Secondary | ICD-10-CM | POA: Diagnosis present

## 2016-07-24 DIAGNOSIS — S0990XA Unspecified injury of head, initial encounter: Secondary | ICD-10-CM | POA: Diagnosis not present

## 2016-07-24 DIAGNOSIS — Z85828 Personal history of other malignant neoplasm of skin: Secondary | ICD-10-CM | POA: Diagnosis not present

## 2016-07-24 DIAGNOSIS — R0902 Hypoxemia: Secondary | ICD-10-CM

## 2016-07-24 DIAGNOSIS — S72001D Fracture of unspecified part of neck of right femur, subsequent encounter for closed fracture with routine healing: Secondary | ICD-10-CM | POA: Diagnosis not present

## 2016-07-24 DIAGNOSIS — S42201S Unspecified fracture of upper end of right humerus, sequela: Secondary | ICD-10-CM | POA: Diagnosis not present

## 2016-07-24 DIAGNOSIS — S43004A Unspecified dislocation of right shoulder joint, initial encounter: Secondary | ICD-10-CM | POA: Diagnosis not present

## 2016-07-24 DIAGNOSIS — S43001A Unspecified subluxation of right shoulder joint, initial encounter: Secondary | ICD-10-CM | POA: Diagnosis not present

## 2016-07-24 DIAGNOSIS — L89899 Pressure ulcer of other site, unspecified stage: Secondary | ICD-10-CM | POA: Diagnosis not present

## 2016-07-24 LAB — COMPREHENSIVE METABOLIC PANEL
ALBUMIN: 2.1 g/dL — AB (ref 3.5–5.0)
ALT: 17 U/L (ref 17–63)
AST: 50 U/L — AB (ref 15–41)
Alkaline Phosphatase: 134 U/L — ABNORMAL HIGH (ref 38–126)
Anion gap: 8 (ref 5–15)
BUN: 44 mg/dL — AB (ref 6–20)
CO2: 29 mmol/L (ref 22–32)
CREATININE: 1.26 mg/dL — AB (ref 0.61–1.24)
Calcium: 9.1 mg/dL (ref 8.9–10.3)
Chloride: 99 mmol/L — ABNORMAL LOW (ref 101–111)
GFR calc Af Amer: >60 mL/min (ref >60)
GFR, EST NON AFRICAN AMERICAN: 52 mL/min — AB (ref >60)
GLUCOSE: 205 mg/dL — AB (ref 65–99)
Potassium: 4.7 mmol/L (ref 3.5–5.1)
Sodium: 136 mmol/L (ref 135–145)
TOTAL PROTEIN: 6.3 g/dL — AB (ref 6.5–8.1)
Total Bilirubin: 0.4 mg/dL (ref 0.3–1.2)

## 2016-07-24 LAB — CBC WITH DIFFERENTIAL/PLATELET
BASOS ABS: 0 10*3/uL (ref 0.0–0.1)
Basophils Relative: 0 %
EOS ABS: 0 10*3/uL (ref 0.0–0.7)
EOS PCT: 0 %
HCT: 23.1 % — ABNORMAL LOW (ref 39.0–52.0)
Hemoglobin: 7.3 g/dL — ABNORMAL LOW (ref 13.0–17.0)
LYMPHS ABS: 1.2 10*3/uL (ref 0.7–4.0)
Lymphocytes Relative: 10 %
MCH: 28.3 pg (ref 26.0–34.0)
MCHC: 31.6 g/dL (ref 30.0–36.0)
MCV: 89.5 fL (ref 78.0–100.0)
Monocytes Absolute: 0.8 10*3/uL (ref 0.1–1.0)
Monocytes Relative: 6 %
Neutro Abs: 10.5 10*3/uL — ABNORMAL HIGH (ref 1.7–7.7)
Neutrophils Relative %: 84 %
PLATELETS: 240 10*3/uL (ref 150–400)
RBC: 2.58 MIL/uL — AB (ref 4.22–5.81)
RDW: 16.4 % — ABNORMAL HIGH (ref 11.5–15.5)
WBC: 12.6 10*3/uL — AB (ref 4.0–10.5)

## 2016-07-24 LAB — PROTIME-INR
INR: 3.6
PROTHROMBIN TIME: 36.8 s — AB (ref 11.4–15.2)

## 2016-07-24 LAB — I-STAT TROPONIN, ED: Troponin i, poc: 0.08 ng/mL (ref 0.00–0.08)

## 2016-07-24 LAB — CBG MONITORING, ED: Glucose-Capillary: 238 mg/dL — ABNORMAL HIGH (ref 65–99)

## 2016-07-24 LAB — PREPARE RBC (CROSSMATCH)

## 2016-07-24 LAB — BRAIN NATRIURETIC PEPTIDE: B Natriuretic Peptide: 167 pg/mL — ABNORMAL HIGH (ref 0.0–100.0)

## 2016-07-24 MED ORDER — SODIUM CHLORIDE 0.9 % IV SOLN: 10.0000 mL/h | Freq: Once | INTRAVENOUS | Status: DC

## 2016-07-24 MED ORDER — PHYTONADIONE 1 MG/0.5 ML ORAL SOLUTION
1.0000 mg | Freq: Once | ORAL | Status: AC
  Administered 2016-07-25: 1 mg via ORAL
  Filled 2016-07-24: qty 0.5

## 2016-07-24 NOTE — ED Provider Notes (Signed)
[facility] DEPT Provider Note   CSN: [id number] Arrival date & time: 07/24/16  2007     History   Chief Complaint Chief Complaint  Patient presents with  . Fracture    HPI [name]. is a 82 y.o. male.  The history is provided by the patient. No language interpreter was used.   [name]. is a 82 y.o. male who presents to the Emergency Department complaining of fracture.  He presents from [facility] for evaluation of fracture to his right arm. Level V caveat Due To Confusion. Patient does not know why he is here or how he broke his arm.  Per his wife he got out of bed without assistance and was found on the floor earlier today. She states he has a history of Alzheimer's and is confused at times. Past Medical History:  Diagnosis Date  . Aortal stenosis 2006   biophrostetic  . Arthritis   . Atrial fibrillation ([place])    onset after CABG--- coumadin management at [facility]madin clinic  . Carotid artery disease ([place]) 09/27/2010   Carotid US 12/17: bilat ICA 40-59 >> 1 year FU  . CHF (congestive heart failure) ([place])    abter CABG  . Complete heart block ([place])   . Coronary artery disease   . DM type 2 (diabetes mellitus, type 2) ([place])    used to see Dr Meredith Pel, now f/u by Dr Larose Kells  . Erectile dysfunction   . Gangrene ([place])   . History of nephrolithiasis   . HTN (hypertension)   . Hyperlipidemia   . Memory loss   . Obesity   . OSA (obstructive sleep apnea)    not using CPAP  . Pacemaker 2/07   VVI  . PVD (peripheral vascular disease) ([name])    CT angio showed- vascular insuff, intestine and RAS bilaterally, andgiogram 02/2009 severe PVD medical management  . S/P aortic valve replacement   . Sick sinus syndrome ([name])    [name]  . Skin cancer    surgery (nose) 09-2010    Patient Active Problem List   Diagnosis Date Noted  . Right humeral fracture 07/25/2016  . Anemia 07/24/2016  . Wound abscess   . Congestive heart failure ([place])   . S/P  thoracentesis   . Malnutrition of moderate degree 07/04/2016  . Pleural effusion 07/04/2016  . Diabetic foot ulcer with osteomyelitis ([place]) 07/03/2016  . Hypoxia 07/03/2016  . Acute diastolic CHF (congestive heart failure) ([place]) 07/03/2016  . Abscess of abdominal wall 06/02/2014  . Abdominal wall abscess 06/02/2014  . Hyponatremia 06/02/2014  . Cellulitis 12/23/2013  . Aftercare following surgery of the circulatory system, [place] 10/05/2013  . Encounter for therapeutic drug monitoring 07/18/2013  . Tingling 11/01/2012  . Hypoxemia 08/04/2012  . Complete heart block ([place]) 07/26/2012  . Fatigue 01/20/2012  . Memory loss 01/20/2012  . Chronic diastolic heart failure ([place]) 09/01/2011  . Chronic atrial fibrillation ([place]) 08/21/2011  . Aortic valve stenosis, severe prosthetic 02/06/2011  . Paresthesia 12/16/2010  . Medicare annual wellness visit, initial 10/10/2010  . Carotid artery disease ([place]) 09/27/2010  . Long term (current) use of anticoagulants 09/26/2010  . THYROID NODULE 01/26/2010  . PVD (peripheral vascular disease) ([place]) 01/02/2009  . HIP PAIN, BILATERAL 01/02/2009  . HLD (hyperlipidemia) 08/16/2008  . MYOCARDIAL INFARCTION, HX OF 08/16/2008  . S/P AVR (aortic valve replacement) 08/16/2008  . PERIPHERAL VASCULAR DISEASE WITH CLAUDICATION 08/16/2008  . ANEMIA, HX OF 08/16/2008  . AORTIC VALVE  REPLACEMENT, HX OF 08/16/2008  . PACEMAKER, PERMANENT 08/16/2008  . ERECTILE DYSFUNCTION 11/26/2006  . DM (diabetes mellitus) type II uncontrolled, periph vascular disorder ([place]) 07/28/2006  . OBESITY, MORBID 07/28/2006  . Obstructive sleep apnea 07/28/2006  . Essential hypertension 07/28/2006  . CAD (coronary artery disease) 07/28/2006  . RENAL ARTERY STENOSIS 07/28/2006  . INSUFFICIENCY, VASCULAR NOS, INTESTINE 07/28/2006  . NEPHROLITHIASIS, HX OF 07/28/2006    Past Surgical History:  Procedure Laterality Date  . ABDOMINAL AORTAGRAM N/A 11/10/2012   Procedure: ABDOMINAL  Maxcine Ham;  Surgeon: Serafina Mitchell, MD;  Location: [facility] CATH LAB;  Service: Cardiovascular;  Laterality: N/A;  . AMPUTATION  01/2010   great toe  . AMPUTATION Left 07/16/2016   Procedure: RECECTION OF LEFT GREAT TOE METATARSAL HEAD and sesamoid;  Surgeon: Waynetta Sandy, MD;  Location: [facility];  Service: Vascular;  Laterality: Left;  . ANGIOPLASTY / STENTING FEMORAL  02/10/10   Left femoral  . ANGIOPLASTY ILLIAC ARTERY Left 07/16/2016   Procedure: drug coated Balloon ANGIOPLASTY of left superficial femoral artry stent;  Surgeon: Waynetta Sandy, MD;  Location: [facility];  Service: Vascular;  Laterality: Left;  . AORTIC VALVE REPLACEMENT     biophrostetic for Ao Stenosis 2006  . AORTIC VALVE REPLACEMENT  01/17/2011   S/P redo median sternotomy, extracorporeal circulation, redo Aortic Valve Replacement using a 23-mm Edwards pericardial Magna-Ease valve, Dr Ulla Gallo  . AORTOGRAM N/A 07/16/2016   Procedure: left lower extremity angiogram;  Surgeon: Waynetta Sandy, MD;  [facility];  Service: Vascular;  Laterality: N/A;  . CATARACT EXTRACTION, BILATERAL    . CORONARY ARTERY BYPASS GRAFT  2006  . EP IMPLANTABLE DEVICE N/A 06/06/2016   Procedure: PPM Generator Changeout;  Surgeon: Thompson Grayer, MD;  Location: [facility] CV LAB;  Service: Cardiovascular;  Laterality: N/A;  . EYE SURGERY    . ILIAC ATHERECTOMY Left 07/16/2016   Procedure: Angioplasty and ATHERECTOMY of left anterior tibial artery;  Surgeon: Waynetta Sandy, MD;  Location: [facility];  Service: Vascular;  Laterality: Left;  . INCISE AND DRAIN ABCESS Right May 2014   right heel   . LOWER EXTREMITY ANGIOGRAM Left 11/23/2012   Procedure: LOWER EXTREMITY ANGIOGRAM;  Surgeon: Serafina Mitchell, MD;  Location: [facility] CATH LAB;  Service: Cardiovascular;  Laterality: Left;  . PACEMAKER PLACEMENT  07/2005   Medtronic Sigma SR implanted by Dr Olevia Perches  . PERIPHERAL VASCULAR CATHETERIZATION N/A 07/10/2016   Procedure:  Abdominal Aortogram;  Surgeon: Conrad Elmwood Park, MD;  Location: [facility] CV LAB;  Service: Cardiovascular;  Laterality: N/A;  . PERIPHERAL VASCULAR CATHETERIZATION Bilateral 07/10/2016   Procedure: Lower Extremity Angiography;  Surgeon: Conrad Durant, MD;  Location: [facility] CV LAB;  Service: Cardiovascular;  Laterality: Bilateral;  . TONSILLECTOMY    . ULTRASOUND GUIDANCE FOR VASCULAR ACCESS Bilateral 07/16/2016   Procedure: ULTRASOUND GUIDANCE FOR VASCULAR ACCESS of right common femoral and left dorsalis pedis;  Surgeon: Waynetta Sandy, MD;  Location[facility];  Service: Vascular;  Laterality: Bilateral;       Home Medications    Prior to Admission medications   Medication Sig Start Date End Date Taking? Authorizing Provider  acetaminophen (TYLENOL) 325 MG tablet Take 2 tablets (650 mg total) by mouth every 4 (four) hours as needed for headache or mild pain. 07/21/16  Yes Oswald Hillock, MD  aspirin EC 81 MG EC tablet Take 1 tablet (81 mg total) by mouth daily. 07/22/16  Yes Oswald Hillock,  MD  donepezil (ARICEPT) 10 MG tablet Take 1 tablet (10 mg total) by mouth at bedtime. 05/08/16  Yes Kathrynn Ducking, MD  ezetimibe-simvastatin (VYTORIN) 10-40 MG per tablet Take 1 tablet by mouth at bedtime. 05/06/12  Yes Larey Dresser, MD  furosemide (LASIX) 40 MG tablet Take 1 tablet (40 mg total) by mouth 2 (two) times daily. 07/21/16  Yes Oswald Hillock, MD  insulin aspart (NOVOLOG) 100 UNIT/ML injection Inject 0-9 Units into the skin 3 (three) times daily with meals. Sliding scale insulin Less than 70 initiate hypoglycemia protocol 70-120  0 units 120-150 1 unit 151-200 2 units 201-250 3 units 251-300 5 units 301-350 7 units 351-400 9 units  Greater than 400 call MD 07/21/16  Yes Oswald Hillock, MD  insulin glargine (LANTUS) 100 UNIT/ML injection Inject 0.15 mLs (15 Units total) into the skin at bedtime. 06/07/14  Yes Belkys A Regalado, MD  lisinopril (PRINIVIL,ZESTRIL) 10 MG tablet Take 1  tablet (10 mg total) by mouth daily. 07/25/15  Yes Liliane Shi, PA-C  Multiple Vitamins-Minerals (DECUBI-VITE) CAPS Take 1 capsule by mouth daily.   Yes Historical Provider, MD  nystatin (MYCOSTATIN/NYSTOP) powder Apply topically 3 (three) times daily. Apply to groin daily 07/21/16 07/28/16 Yes Oswald Hillock, MD  OVER THE COUNTER MEDICATION Take 120 mLs by mouth daily. Med pass   Yes Historical Provider, MD  warfarin (COUMADIN) 3 MG tablet Take 4.5 mg by mouth daily at 6 PM.   Yes Historical Provider, MD    Family History Family History  Problem Relation Age of Onset  . Heart attack Father 61  . Heart disease Father     Before age 43  . Hypertension Father   . Stroke Mother 6  . Cancer Sister     Cervical  . Heart attack Sister   . Heart disease Sister   . Diabetes Sister     2 sister w/ DM  . Heart attack Sister   . Heart attack Sister   . Heart disease Sister 23    AAA  and  Stomach Aneurysm  . AAA (abdominal aortic aneurysm) Sister   . Heart disease Sister     Before age 31  . Heart attack Sister 56  . Colon cancer Neg Hx   . Prostate cancer Neg Hx     Social History Social History  Substance Use Topics  . Smoking status: Former Smoker    Types: Cigarettes    Quit date: 06/23/1988  . Smokeless tobacco: Former Systems developer  . Alcohol use No     Allergies   Potassium-containing compounds and Metoprolol succinate   Review of Systems Review of Systems  All other systems reviewed and are negative.    Physical Exam Updated Vital Signs BP (!) 101/50   Pulse (!) 59   Temp 97.7 F (36.5 C) (Oral)   Resp 22   Ht 5\' 10"  (1.778 m)   Wt 195 lb (88.5 kg)   SpO2 97%   BMI 27.98 kg/m   Physical Exam  Constitutional: He appears well-developed.  Chronically ill-appearing  HENT:  Head: Normocephalic and atraumatic.  Cardiovascular: Normal rate and regular rhythm.   Murmur heard. systolic ejection murmur  Pulmonary/Chest: Effort normal and breath sounds normal. No  respiratory distress.  Abdominal: Soft. There is no tenderness. There is no rebound and no guarding.  Musculoskeletal: He exhibits no edema.  Deformity and tenderness to the right shoulder and upper arm. No tenderness over the pelvis and  hips. There is the boot and dressing on the left foot. There is trace edema to the left lower extremity.  Neurological: He is alert.  Disoriented to time. Generalized weakness.  Skin: Skin is warm and dry. There is pallor.  Psychiatric: He has a normal mood and affect. His behavior is normal.  Nursing note and vitals reviewed.    ED Treatments / Results  Labs (all labs ordered are listed, but only abnormal results are displayed) Labs Reviewed  COMPREHENSIVE METABOLIC PANEL - Abnormal; Notable for the following:       Result Value   Chloride 99 (*)    Glucose, Bld 205 (*)    BUN 44 (*)    Creatinine, Ser 1.26 (*)    Total Protein 6.3 (*)    Albumin 2.1 (*)    AST 50 (*)    Alkaline Phosphatase 134 (*)    GFR calc non Af Amer 52 (*)    All other components within normal limits  CBC WITH DIFFERENTIAL/PLATELET - Abnormal; Notable for the following:    WBC 12.6 (*)    RBC 2.58 (*)    Hemoglobin 7.3 (*)    HCT 23.1 (*)    RDW 16.4 (*)    Neutro Abs 10.5 (*)    All other components within normal limits  PROTIME-INR - Abnormal; Notable for the following:    Prothrombin Time 36.8 (*)    All other components within normal limits  BRAIN NATRIURETIC PEPTIDE - Abnormal; Notable for the following:    B Natriuretic Peptide 167.0 (*)    All other components within normal limits  CBG MONITORING, ED - Abnormal; Notable for the following:    Glucose-Capillary 238 (*)    All other components within normal limits  CBC  BASIC METABOLIC PANEL  PROTIME-INR  OCCULT BLOOD X 1 CARD TO LAB, STOOL  HEMOGLOBIN AND HEMATOCRIT, BLOOD  I-STAT TROPOININ, ED  TYPE AND SCREEN  PREPARE RBC (CROSSMATCH)    EKG  EKG Interpretation  Date/Time:  Thursday July 24 2016 20:30:15 EST Ventricular Rate:  70 PR Interval:    QRS Duration: 175 QT Interval:  485 QTC Calculation: 524 R Axis:   -88 Text Interpretation:  Ventricular-paced rhythm No further analysis attempted due to paced rhythm Baseline wander in lead(s) V5 Confirmed by Hazle Coca [phone]) on 07/24/2016 9:34:16 PM       Radiology Dg Chest 1 View  Result Date: 07/24/2016 CLINICAL DATA:  [place] [facility] day nursing facility. EXAM: CHEST 1 VIEW COMPARISON:  07/14/2016 FINDINGS: Mildly comminuted and displaced proximal right humeral fracture. Moderate vascular and interstitial prominence, worsened. Central and basilar airspace opacities likely represent alveolar edema. Moderate right pleural effusion, unchanged or slightly enlarged. Unchanged cardiomegaly. The transvenous cardiac lead appears grossly intact. Prior sternotomy with CABG and aortic valvuloplasty. IMPRESSION: Interstitial and vascular changes, as well as central airspace opacities, consistent with congestive heart failure. Moderate right pleural effusion. Electronically Signed   By: Andreas Newport M.D.   On: 07/24/2016 21:06   Dg Shoulder Right  Result Date: 07/24/2016 CLINICAL DATA:  Fall with right arm pain. Humerus fracture on outside radiograph. EXAM: RIGHT SHOULDER - 2+ VIEW COMPARISON:  None. FINDINGS: Oblique fracture of the humeral neck is mildly comminuted. There is 18 mm lateral and mild anterior displacement of the humeral diaphysis. No evidence of extension to the glenohumeral joint. Chronic deformity of the right acromioclavicular joint fragmentation. Vascular calcifications are seen. IMPRESSION: Displaced mildly comminuted humeral neck fracture. No evidence  of intra-articular extension. Electronically Signed   By: Jeb Levering M.D.   On: 07/24/2016 21:07   Ct Head Wo Contrast  Result Date: 07/24/2016 CLINICAL DATA:  Status post fall. EXAM: CT HEAD WITHOUT CONTRAST CT CERVICAL SPINE WITHOUT CONTRAST TECHNIQUE: Multidetector  CT imaging of the head and cervical spine was performed following the standard protocol without intravenous contrast. Multiplanar CT image reconstructions of the cervical spine were also generated. COMPARISON:  Head CT dated 01/08/2011. FINDINGS: CT HEAD FINDINGS Brain: There is generalized age related parenchymal atrophy with commensurate dilatation of the ventricles and sulci. Mild chronic small vessel ischemic changes noted within the deep periventricular white matter regions bilaterally. There is no mass, hemorrhage, edema or other evidence of acute parenchymal abnormality. No extra-axial hemorrhage. Vascular: There are chronic calcified atherosclerotic changes of the large vessels at the skull base. No unexpected hyperdense vessel. Skull: Normal. Negative for fracture or focal lesion. Sinuses/Orbits: No acute finding. Other: None. CT CERVICAL SPINE FINDINGS Alignment: Slightly accentuated kyphosis of the lower cervical spine related to underlying degenerative change. No evidence of acute subluxation. Skull base and vertebrae: No fracture line or displaced fracture fragment identified. Soft tissues and spinal canal: No prevertebral fluid or swelling. No visible canal hematoma. Disc levels: Degenerative change within the mid and lower cervical spine, mild to moderate in degree, with associated mild disc space narrowings and osseous spurring. No more than mild central canal stenosis at any level. Upper chest: Large right pleural effusion extending to the right lung apex, with probable adjacent atelectasis. Other: Heavy atherosclerotic calcifications at each carotid bulb region and at each carotid origin. IMPRESSION: 1. No acute intracranial abnormality. No intracranial mass, hemorrhage or edema. No skull fracture. Atrophy and chronic ischemic changes in the white matter. 2. No fracture or acute subluxation within the cervical spine. Degenerative/chronic findings of the cervical spine, as detailed above. 3. Large  right pleural effusion, extending to the right lung apex, with probable adjacent atelectasis. 4. Carotid atherosclerosis. Electronically Signed   By: Franki Cabot M.D.   On: 07/24/2016 21:42   Ct Cervical Spine Wo Contrast  Result Date: 07/24/2016 CLINICAL DATA:  Status post fall. EXAM: CT HEAD WITHOUT CONTRAST CT CERVICAL SPINE WITHOUT CONTRAST TECHNIQUE: Multidetector CT imaging of the head and cervical spine was performed following the standard protocol without intravenous contrast. Multiplanar CT image reconstructions of the cervical spine were also generated. COMPARISON:  Head CT dated 01/08/2011. FINDINGS: CT HEAD FINDINGS Brain: There is generalized age related parenchymal atrophy with commensurate dilatation of the ventricles and sulci. Mild chronic small vessel ischemic changes noted within the deep periventricular white matter regions bilaterally. There is no mass, hemorrhage, edema or other evidence of acute parenchymal abnormality. No extra-axial hemorrhage. Vascular: There are chronic calcified atherosclerotic changes of the large vessels at the skull base. No unexpected hyperdense vessel. Skull: Normal. Negative for fracture or focal lesion. Sinuses/Orbits: No acute finding. Other: None. CT CERVICAL SPINE FINDINGS Alignment: Slightly accentuated kyphosis of the lower cervical spine related to underlying degenerative change. No evidence of acute subluxation. Skull base and vertebrae: No fracture line or displaced fracture fragment identified. Soft tissues and spinal canal: No prevertebral fluid or swelling. No visible canal hematoma. Disc levels: Degenerative change within the mid and lower cervical spine, mild to moderate in degree, with associated mild disc space narrowings and osseous spurring. No more than mild central canal stenosis at any level. Upper chest: Large right pleural effusion extending to the right lung apex, with probable adjacent  atelectasis. Other: Heavy atherosclerotic  calcifications at each carotid bulb region and at each carotid origin. IMPRESSION: 1. No acute intracranial abnormality. No intracranial mass, hemorrhage or edema. No skull fracture. Atrophy and chronic ischemic changes in the white matter. 2. No fracture or acute subluxation within the cervical spine. Degenerative/chronic findings of the cervical spine, as detailed above. 3. Large right pleural effusion, extending to the right lung apex, with probable adjacent atelectasis. 4. Carotid atherosclerosis. Electronically Signed   By: Franki Cabot M.D.   On: 07/24/2016 21:42    Procedures Procedures (including critical care time)  Medications Ordered in ED Medications  0.9 %  sodium chloride infusion (not administered)  furosemide (LASIX) tablet 40 mg (not administered)  aspirin EC tablet 81 mg (not administered)  nystatin (MYCOSTATIN/NYSTOP) topical powder (not administered)  ezetimibe-simvastatin (VYTORIN) 10-40 MG per tablet 1 tablet (not administered)  donepezil (ARICEPT) tablet 10 mg (not administered)  insulin glargine (LANTUS) injection 15 Units (not administered)  ondansetron (ZOFRAN) tablet 4 mg (not administered)    Or  ondansetron (ZOFRAN) injection 4 mg (not administered)  acetaminophen (TYLENOL) tablet 650 mg (not administered)    Or  acetaminophen (TYLENOL) suppository 650 mg (not administered)  albuterol (PROVENTIL) (2.5 MG/3ML) 0.083% nebulizer solution 2.5 mg (not administered)  ipratropium (ATROVENT) nebulizer solution 0.5 mg (not administered)  insulin aspart (novoLOG) injection 0-9 Units (not administered)  furosemide (LASIX) injection 20 mg (not administered)  phytonadione (VITAMIN K) oral solution 1 mg/0.5 mL (1 mg Oral Given 07/25/16 0002)     Initial Impression / Assessment and Plan / ED Course  I have reviewed the triage vital signs and the nursing notes.  Pertinent labs & imaging results that were available during my care of the patient were reviewed by me and  considered in my medical decision making (see chart for details).     Patient with history of Alzheimer's as well as cardiac disease on Coumadin here for evaluation of injuries following a fall. He has a proximal humerus fracture. Labs demonstrate anemia with hemoglobin of 7.3, trending down from several days ago of 7.7. He also has an INR of 3. His blood pressure has been on the low side in the emergency department around [id number] systolic. Plan to transfuse 1 unit of blood given his low blood pressures and worsening anemia in setting of new fracture, otherwise he has no evidence of active hemorrhage. Plan to provide oral vitamin K, 1 mg after conversation with pharmacy regarding patient's current clinical status. Discussed with patient and his wife multiple medical issues and seriousness of his illness. They are agreeable to blood transfusion and admission for further treatment. Hospitalist consulted for admission.  Final Clinical Impressions(s) / ED Diagnoses   Final diagnoses:  None    New Prescriptions New Prescriptions   No medications on file     Quintella Reichert, MD 07/25/16 [phone]

## 2016-07-24 NOTE — ED Notes (Signed)
Nurse currently drawing labs 

## 2016-07-24 NOTE — ED Triage Notes (Signed)
Pt arrived from [facility] s/p fall. Per EMS SNF had outside facility xray his arm and he was found to have a displaced fracture to his R humerous.

## 2016-07-24 NOTE — ED Notes (Signed)
Spoke with Manny from ortho for stabilizer sling.

## 2016-07-25 ENCOUNTER — Inpatient Hospital Stay (HOSPITAL_COMMUNITY): Payer: Medicare Other

## 2016-07-25 DIAGNOSIS — G309 Alzheimer's disease, unspecified: Secondary | ICD-10-CM

## 2016-07-25 DIAGNOSIS — S42301A Unspecified fracture of shaft of humerus, right arm, initial encounter for closed fracture: Secondary | ICD-10-CM | POA: Diagnosis present

## 2016-07-25 DIAGNOSIS — W19XXXA Unspecified fall, initial encounter: Secondary | ICD-10-CM | POA: Diagnosis present

## 2016-07-25 DIAGNOSIS — J9611 Chronic respiratory failure with hypoxia: Secondary | ICD-10-CM | POA: Diagnosis present

## 2016-07-25 DIAGNOSIS — F0280 Dementia in other diseases classified elsewhere without behavioral disturbance: Secondary | ICD-10-CM | POA: Diagnosis present

## 2016-07-25 DIAGNOSIS — S42211A Unspecified displaced fracture of surgical neck of right humerus, initial encounter for closed fracture: Secondary | ICD-10-CM

## 2016-07-25 DIAGNOSIS — R791 Abnormal coagulation profile: Secondary | ICD-10-CM | POA: Diagnosis present

## 2016-07-25 LAB — BASIC METABOLIC PANEL
ANION GAP: 5 (ref 5–15)
ANION GAP: 9 (ref 5–15)
BUN: 40 mg/dL — ABNORMAL HIGH (ref 6–20)
BUN: 45 mg/dL — ABNORMAL HIGH (ref 6–20)
CALCIUM: 9.2 mg/dL (ref 8.9–10.3)
CO2: 31 mmol/L (ref 22–32)
CO2: 31 mmol/L (ref 22–32)
Calcium: 9.2 mg/dL (ref 8.9–10.3)
Chloride: 100 mmol/L — ABNORMAL LOW (ref 101–111)
Chloride: 98 mmol/L — ABNORMAL LOW (ref 101–111)
Creatinine, Ser: 1.19 mg/dL (ref 0.61–1.24)
Creatinine, Ser: 1.36 mg/dL — ABNORMAL HIGH (ref 0.61–1.24)
GFR, EST AFRICAN AMERICAN: 55 mL/min — AB (ref >60)
GFR, EST NON AFRICAN AMERICAN: 56 mL/min — AB (ref >60)
GFR, EST NON AFRICAN AMERICAN: 48 mL/min — AB (ref >60)
GLUCOSE: 141 mg/dL — AB (ref 65–99)
Glucose, Bld: 83 mg/dL (ref 65–99)
POTASSIUM: 4.1 mmol/L (ref 3.5–5.1)
POTASSIUM: 4.6 mmol/L (ref 3.5–5.1)
SODIUM: 136 mmol/L (ref 135–145)
SODIUM: 138 mmol/L (ref 135–145)

## 2016-07-25 LAB — GLUCOSE, CAPILLARY
GLUCOSE-CAPILLARY: 78 mg/dL (ref 65–99)
Glucose-Capillary: 123 mg/dL — ABNORMAL HIGH (ref 65–99)
Glucose-Capillary: 111 mg/dL — ABNORMAL HIGH (ref 65–99)
Glucose-Capillary: 152 mg/dL — ABNORMAL HIGH (ref 65–99)

## 2016-07-25 LAB — CBC WITH DIFFERENTIAL/PLATELET
BASOS ABS: 0 10*3/uL (ref 0.0–0.1)
BASOS PCT: 0 %
EOS ABS: 0.3 10*3/uL (ref 0.0–0.7)
EOS PCT: 3 %
HCT: 26.9 % — ABNORMAL LOW (ref 39.0–52.0)
Hemoglobin: 8.3 g/dL — ABNORMAL LOW (ref 13.0–17.0)
LYMPHS PCT: 16 %
Lymphs Abs: 1.6 10*3/uL (ref 0.7–4.0)
MCH: 27.3 pg (ref 26.0–34.0)
MCHC: 30.9 g/dL (ref 30.0–36.0)
MCV: 88.5 fL (ref 78.0–100.0)
Monocytes Absolute: 1.1 10*3/uL — ABNORMAL HIGH (ref 0.1–1.0)
Monocytes Relative: 11 %
Neutro Abs: 6.9 10*3/uL (ref 1.7–7.7)
Neutrophils Relative %: 70 %
PLATELETS: 301 10*3/uL (ref 150–400)
RBC: 3.04 MIL/uL — AB (ref 4.22–5.81)
RDW: 15.7 % — ABNORMAL HIGH (ref 11.5–15.5)
WBC: 9.9 10*3/uL (ref 4.0–10.5)

## 2016-07-25 LAB — URINALYSIS, ROUTINE W REFLEX MICROSCOPIC
BILIRUBIN URINE: NEGATIVE
GLUCOSE, UA: NEGATIVE mg/dL
HGB URINE DIPSTICK: NEGATIVE
KETONES UR: NEGATIVE mg/dL
Leukocytes, UA: NEGATIVE
Nitrite: NEGATIVE
PH: 5 (ref 5.0–8.0)
PROTEIN: NEGATIVE mg/dL
Specific Gravity, Urine: 1.013 (ref 1.005–1.030)

## 2016-07-25 LAB — CBC
HCT: 27.4 % — ABNORMAL LOW (ref 39.0–52.0)
Hemoglobin: 8.4 g/dL — ABNORMAL LOW (ref 13.0–17.0)
MCH: 27.3 pg (ref 26.0–34.0)
MCHC: 30.7 g/dL (ref 30.0–36.0)
MCV: 89 fL (ref 78.0–100.0)
PLATELETS: 290 10*3/uL (ref 150–400)
RBC: 3.08 MIL/uL — AB (ref 4.22–5.81)
RDW: 15.6 % — ABNORMAL HIGH (ref 11.5–15.5)
WBC: 10.8 10*3/uL — AB (ref 4.0–10.5)

## 2016-07-25 LAB — POCT I-STAT 3, ART BLOOD GAS (G3+)
Acid-Base Excess: 6 mmol/L — ABNORMAL HIGH (ref 0.0–2.0)
BICARBONATE: 31.7 mmol/L — AB (ref 20.0–28.0)
O2 Saturation: 99 %
TCO2: 33 mmol/L (ref 0–100)
pCO2 arterial: 49.5 mmHg — ABNORMAL HIGH (ref 32.0–48.0)
pH, Arterial: 7.414 (ref 7.350–7.450)
pO2, Arterial: 139 mmHg — ABNORMAL HIGH (ref 83.0–108.0)

## 2016-07-25 LAB — HEMOGLOBIN AND HEMATOCRIT, BLOOD
HEMATOCRIT: 27 % — AB (ref 39.0–52.0)
HEMOGLOBIN: 8.2 g/dL — AB (ref 13.0–17.0)

## 2016-07-25 LAB — BRAIN NATRIURETIC PEPTIDE: B NATRIURETIC PEPTIDE 5: 190.9 pg/mL — AB (ref 0.0–100.0)

## 2016-07-25 LAB — LACTIC ACID, PLASMA: LACTIC ACID, VENOUS: 0.6 mmol/L (ref 0.5–1.9)

## 2016-07-25 LAB — PROTIME-INR
INR: 3.31
PROTHROMBIN TIME: 34.4 s — AB (ref 11.4–15.2)

## 2016-07-25 LAB — MRSA PCR SCREENING: MRSA by PCR: NEGATIVE

## 2016-07-25 MED ORDER — FUROSEMIDE 10 MG/ML IJ SOLN
20.0000 mg | Freq: Once | INTRAMUSCULAR | Status: AC
  Administered 2016-07-25: 20 mg via INTRAVENOUS
  Filled 2016-07-25: qty 2

## 2016-07-25 MED ORDER — ACETAMINOPHEN 650 MG RE SUPP
650.0000 mg | Freq: Four times a day (QID) | RECTAL | Status: DC | PRN
Start: 2016-07-25 — End: 2016-08-09

## 2016-07-25 MED ORDER — ASPIRIN EC 81 MG PO TBEC
81.0000 mg | DELAYED_RELEASE_TABLET | Freq: Every day | ORAL | Status: DC
  Administered 2016-07-25 – 2016-08-09 (×15): 81 mg via ORAL
  Filled 2016-07-25 (×15): qty 1

## 2016-07-25 MED ORDER — DONEPEZIL HCL 10 MG PO TABS
10.0000 mg | ORAL_TABLET | Freq: Every day | ORAL | Status: DC
  Administered 2016-07-25 – 2016-08-08 (×15): 10 mg via ORAL
  Filled 2016-07-25 (×16): qty 1

## 2016-07-25 MED ORDER — ONDANSETRON HCL 4 MG PO TABS: 4.0000 mg | ORAL_TABLET | Freq: Four times a day (QID) | ORAL | Status: DC | PRN

## 2016-07-25 MED ORDER — INSULIN ASPART 100 UNIT/ML ~~LOC~~ SOLN
0.0000 [IU] | Freq: Three times a day (TID) | SUBCUTANEOUS | Status: DC
  Administered 2016-07-25: 2 [IU] via SUBCUTANEOUS
  Administered 2016-07-25 – 2016-07-29 (×6): 1 [IU] via SUBCUTANEOUS
  Administered 2016-07-29: 2 [IU] via SUBCUTANEOUS
  Administered 2016-07-30: 1 [IU] via SUBCUTANEOUS
  Administered 2016-07-30: 2 [IU] via SUBCUTANEOUS
  Administered 2016-07-30: 1 [IU] via SUBCUTANEOUS
  Administered 2016-07-31 (×2): 2 [IU] via SUBCUTANEOUS
  Administered 2016-07-31: 1 [IU] via SUBCUTANEOUS
  Administered 2016-08-01 (×2): 2 [IU] via SUBCUTANEOUS
  Administered 2016-08-02: 1 [IU] via SUBCUTANEOUS
  Administered 2016-08-02: 2 [IU] via SUBCUTANEOUS
  Administered 2016-08-02: 1 [IU] via SUBCUTANEOUS
  Administered 2016-08-03 (×2): 2 [IU] via SUBCUTANEOUS
  Administered 2016-08-04: 1 [IU] via SUBCUTANEOUS
  Administered 2016-08-04: 2 [IU] via SUBCUTANEOUS
  Administered 2016-08-05 (×2): 1 [IU] via SUBCUTANEOUS
  Administered 2016-08-05: 2 [IU] via SUBCUTANEOUS
  Administered 2016-08-06: 1 [IU] via SUBCUTANEOUS
  Administered 2016-08-06 (×2): 2 [IU] via SUBCUTANEOUS
  Administered 2016-08-07: 1 [IU] via SUBCUTANEOUS
  Administered 2016-08-08 (×3): 2 [IU] via SUBCUTANEOUS

## 2016-07-25 MED ORDER — SODIUM CHLORIDE 0.9 % IV SOLN: INTRAVENOUS | Status: DC

## 2016-07-25 MED ORDER — EZETIMIBE-SIMVASTATIN 10-40 MG PO TABS
1.0000 | ORAL_TABLET | Freq: Every day | ORAL | Status: DC
  Administered 2016-07-25 – 2016-08-08 (×13): 1 via ORAL
  Filled 2016-07-25 (×17): qty 1

## 2016-07-25 MED ORDER — ALBUTEROL SULFATE (2.5 MG/3ML) 0.083% IN NEBU: 2.5000 mg | INHALATION_SOLUTION | RESPIRATORY_TRACT | Status: DC | PRN

## 2016-07-25 MED ORDER — FUROSEMIDE 40 MG PO TABS
40.0000 mg | ORAL_TABLET | Freq: Two times a day (BID) | ORAL | Status: DC
  Administered 2016-07-25: 40 mg via ORAL
  Filled 2016-07-25 (×2): qty 1

## 2016-07-25 MED ORDER — ACETAMINOPHEN 325 MG PO TABS: 650.0000 mg | ORAL_TABLET | ORAL | Status: DC | PRN

## 2016-07-25 MED ORDER — INSULIN GLARGINE 100 UNIT/ML ~~LOC~~ SOLN: 15.0000 [IU] | Freq: Every day | SUBCUTANEOUS | Status: DC

## 2016-07-25 MED ORDER — NYSTATIN 100000 UNIT/GM EX POWD
Freq: Three times a day (TID) | CUTANEOUS | Status: DC
  Administered 2016-07-25 – 2016-08-07 (×37): via TOPICAL
  Administered 2016-08-08: 1 via TOPICAL
  Administered 2016-08-08 (×2): via TOPICAL
  Filled 2016-07-25 (×4): qty 15

## 2016-07-25 MED ORDER — ONDANSETRON HCL 4 MG/2ML IJ SOLN
4.0000 mg | Freq: Four times a day (QID) | INTRAMUSCULAR | Status: DC | PRN
  Administered 2016-07-25: 4 mg via INTRAVENOUS
  Filled 2016-07-25: qty 2

## 2016-07-25 MED ORDER — IPRATROPIUM BROMIDE 0.02 % IN SOLN: 0.5000 mg | RESPIRATORY_TRACT | Status: DC | PRN

## 2016-07-25 MED ORDER — ACETAMINOPHEN 325 MG PO TABS
650.0000 mg | ORAL_TABLET | Freq: Four times a day (QID) | ORAL | Status: DC | PRN
Start: 2016-07-25 — End: 2016-08-09
  Administered 2016-07-25 – 2016-08-05 (×7): 650 mg via ORAL
  Filled 2016-07-25 (×7): qty 2

## 2016-07-25 NOTE — Progress Notes (Addendum)
Patient respiratory rate increased 30rpm, sats mid-70s on 2LNC. R lung sounds very quiet, not able to hear air movement compared to left side which is diminished but air movement heard, resps shallow and unlabored. Patient is very drowsy but arousable. [place] increased to 5L, sats now mid-90s with good waveform. Attending paged. Orders for stat ABG, port CXR and BiPAP, attending to come to bedside and assess.   Update: 8:18 PM ABG resulted, MD aware. Continue to watch patient for now, remaining on 5L O2. Will continue to monitor closely.

## 2016-07-25 NOTE — H&P (Signed)
History and Physical    [name]. [id number] DOB: Jul 07, 1934 DOA: 07/24/2016  Referring MD/NP/PA: Dr. Ayesha Rumpf PCP: [name], MD  Patient coming from: [place]  Chief Complaint: Fall  HPI: [name]. is a 82 y.o. male with medical history significant of severe AS s/p redo aVR (bioprosthetic valve), diastolic CHF, CAD s/p PM & CABG, A.fib on Coumadin, hx left atrial thrombus by TEE in 7/12, PVD, COPD oxygen dependent on 2L [place], DM 2, left foot osteomyelitis s/p amputation, Alz dementia; who presents after a fall. History is obtained from the patient's wife and review of records as patient has significant dementia. Patient was just recently hospitalized 1/11-1/29 to the hospitalists service for acute respiratory failure with hypoxia. Patient seen to have a large right-sided pleural effusion for which he underwent thoracentesis which showed a transit date of fluid. He also received IV diuretics and was transitioned to oral Lasix. diabetic foot ulcer with osteomyelitis treated with antibiotics while hospitalized. Patient had only been at [facility] place for 3 days, but wife notes that he was more lethargic than usual and his short-term memory was significantly worse. Patient reportedly fell sometime this morning around 12:30 and was found on the floor. Patient wife notes since he's had a pacemaker placed he's progressively declined.   ED Course: Upon admission to the emergency department patient was seen to be afebrile, pulse 59-76, respirations 16- 24, blood pressure as low as 93/48, O2 saturation 90-100% on home 2 L of [place]. Lab work revealed WBC 12.6, hemoglobin 7.3(baseline  13 in 05/2016), creatinine 1.26, BUN 44, glucose 205, BNP 167, INR 3.6, troponin 0.08. X-ray images showed no acute intracranial abnormalities ,but did reveal a right humeral neck fracture. Patient was given 1 g of oral vitamin K for elevated INR, typed and screen for blood products and ordered. Transfuse 1 unit of  packed red blood cells.  Review of Systems: Unable to fully obtain due to dementia.  Past Medical History:  Diagnosis Date  . Aortal stenosis 2006   biophrostetic  . Arthritis   . Atrial fibrillation ([place])    onset after CABG--- coumadin management at Coumadin clinic  . Carotid artery disease ([place]) 09/27/2010   Carotid US 12/17: bilat ICA 40-59 >> 1 year FU  . CHF (congestive heart failure) ([place])    abter CABG  . Complete heart block ([place])   . Coronary artery disease   . DM type 2 (diabetes mellitus, type 2) ([place])    used to see Dr Meredith Pel, now f/u by Dr Larose Kells  . Erectile dysfunction   . Gangrene ([place])   . History of nephrolithiasis   . HTN (hypertension)   . Hyperlipidemia   . Memory loss   . Obesity   . OSA (obstructive sleep apnea)    not using CPAP  . Pacemaker 2/07   VVI  . PVD (peripheral vascular disease) ([name])    CT angio showed- vascular insuff, intestine and RAS bilaterally, andgiogram 02/2009 severe PVD medical management  . S/P aortic valve replacement   . Sick sinus syndrome ([name])    [name]  . Skin cancer    surgery (nose) 09-2010    Past Surgical History:  Procedure Laterality Date  . ABDOMINAL AORTAGRAM N/A 11/10/2012   Procedure: ABDOMINAL Maxcine Ham;  Surgeon: Serafina Mitchell, MD;  Location: [facility] CATH LAB;  Service: Cardiovascular;  Laterality: N/A;  . AMPUTATION  01/2010   great toe  . AMPUTATION Left 07/16/2016  Procedure: RECECTION OF LEFT GREAT TOE METATARSAL HEAD and sesamoid;  Surgeon: Waynetta Sandy, MD;  Location: [place];  Service: Vascular;  Laterality: Left;  . ANGIOPLASTY / STENTING FEMORAL  02/10/10   Left femoral  . ANGIOPLASTY ILLIAC ARTERY Left 07/16/2016   Procedure: drug coated Balloon ANGIOPLASTY of left superficial femoral artry stent;  Surgeon: Waynetta Sandy, MD;  Location: [place];  Service: Vascular;  Laterality: Left;  . AORTIC VALVE REPLACEMENT     biophrostetic for Ao Stenosis 2006  . AORTIC VALVE  REPLACEMENT  01/17/2011   S/P redo median sternotomy, extracorporeal circulation, redo Aortic Valve Replacement using a 23-mm Edwards pericardial Magna-Ease valve, Dr Ulla Gallo  . AORTOGRAM N/A 07/16/2016   Procedure: left lower extremity angiogram;  Surgeon: Waynetta Sandy, MD;  Location: [facility] OR;  Service: Vascular;  Laterality: N/A;  . CATARACT EXTRACTION, BILATERAL    . CORONARY ARTERY BYPASS GRAFT  2006  . EP IMPLANTABLE DEVICE N/A 06/06/2016   Procedure: PPM Generator Changeout;  Surgeon: Thompson Grayer, MD;  Location: [place] CV LAB;  Service: Cardiovascular;  Laterality: N/A;  . EYE SURGERY    . ILIAC ATHERECTOMY Left 07/16/2016   Procedure: Angioplasty and ATHERECTOMY of left anterior tibial artery;  Surgeon: Waynetta Sandy, MD;  Location: [place];  Service: Vascular;  Laterality: Left;  . INCISE AND DRAIN ABCESS Right May 2014   right heel   . LOWER EXTREMITY ANGIOGRAM Left 11/23/2012   Procedure: LOWER EXTREMITY ANGIOGRAM;  Surgeon: Serafina Mitchell, MD;  Location: [facility] CATH LAB;  Service: Cardiovascular;  Laterality: Left;  . PACEMAKER PLACEMENT  07/2005   Medtronic Sigma SR implanted by Dr Olevia Perches  . PERIPHERAL VASCULAR CATHETERIZATION N/A 07/10/2016   Procedure: Abdominal Aortogram;  Surgeon: Conrad [place], MD;  Location: [place] CV LAB;  Service: Cardiovascular;  Laterality: N/A;  . PERIPHERAL VASCULAR CATHETERIZATION Bilateral 07/10/2016   Procedure: Lower Extremity Angiography;  Surgeon: Conrad , MD;  Location: [place] CV LAB;  Service: Cardiovascular;  Laterality: Bilateral;  . TONSILLECTOMY    . ULTRASOUND GUIDANCE FOR VASCULAR ACCESS Bilateral 07/16/2016   Procedure: ULTRASOUND GUIDANCE FOR VASCULAR ACCESS of right common femoral and left dorsalis pedis;  Surgeon: Waynetta Sandy, MD;  Location: [facility] OR;  Service: Vascular;  Laterality: Bilateral;     reports that he quit smoking about 28 years ago. His smoking use included Cigarettes. He has  quit using smokeless tobacco. He reports that he does not drink alcohol or use drugs.  Allergies  Allergen Reactions  . Potassium-Containing Compounds Anaphylaxis    IV--loss of conciousness  . Metoprolol Succinate Other (See Comments)    Extremely tired    Family History  Problem Relation Age of Onset  . Heart attack Father 11  . Heart disease Father     Before age 64  . Hypertension Father   . Stroke Mother 55  . Cancer Sister     Cervical  . Heart attack Sister   . Heart disease Sister   . Diabetes Sister     2 sister w/ DM  . Heart attack Sister   . Heart attack Sister   . Heart disease Sister 46    AAA  and  Stomach Aneurysm  . AAA (abdominal aortic aneurysm) Sister   . Heart disease Sister     Before age 68  . Heart attack Sister 34  . Colon cancer Neg Hx   . Prostate cancer Neg Hx  Prior to Admission medications   Medication Sig Start Date End Date Taking? Authorizing Provider  acetaminophen (TYLENOL) 325 MG tablet Take 2 tablets (650 mg total) by mouth every 4 (four) hours as needed for headache or mild pain. 07/21/16  Yes Oswald Hillock, MD  aspirin EC 81 MG EC tablet Take 1 tablet (81 mg total) by mouth daily. 07/22/16  Yes Oswald Hillock, MD  donepezil (ARICEPT) 10 MG tablet Take 1 tablet (10 mg total) by mouth at bedtime. 05/08/16  Yes Kathrynn Ducking, MD  ezetimibe-simvastatin (VYTORIN) 10-40 MG per tablet Take 1 tablet by mouth at bedtime. 05/06/12  Yes Larey Dresser, MD  furosemide (LASIX) 40 MG tablet Take 1 tablet (40 mg total) by mouth 2 (two) times daily. 07/21/16  Yes Oswald Hillock, MD  insulin aspart (NOVOLOG) 100 UNIT/ML injection Inject 0-9 Units into the skin 3 (three) times daily with meals. Sliding scale insulin Less than 70 initiate hypoglycemia protocol 70-120  0 units 120-150 1 unit 151-200 2 units 201-250 3 units 251-300 5 units 301-350 7 units 351-400 9 units  Greater than 400 call MD 07/21/16  Yes Oswald Hillock, MD  insulin glargine  (LANTUS) 100 UNIT/ML injection Inject 0.15 mLs (15 Units total) into the skin at bedtime. 06/07/14  Yes Belkys A Regalado, MD  lisinopril (PRINIVIL,ZESTRIL) 10 MG tablet Take 1 tablet (10 mg total) by mouth daily. 07/25/15  Yes Liliane Shi, PA-C  Multiple Vitamins-Minerals (DECUBI-VITE) CAPS Take 1 capsule by mouth daily.   Yes Historical Provider, MD  nystatin (MYCOSTATIN/NYSTOP) powder Apply topically 3 (three) times daily. Apply to groin daily 07/21/16 07/28/16 Yes Oswald Hillock, MD  OVER THE COUNTER MEDICATION Take 120 mLs by mouth daily. Med pass   Yes Historical Provider, MD  warfarin (COUMADIN) 3 MG tablet Take 4.5 mg by mouth daily at 6 PM.   Yes Historical Provider, MD    Physical Exam:  Constitutional: Elderly male who has left arm in sling. Vitals:   07/24/16 2210 07/24/16 2230 07/24/16 2234 07/24/16 2243  BP: (!) 101/42 (!) 101/46  (!) 103/50  Pulse: 60 (!) 59  62  Resp: 19 19  21   Temp:      TempSrc:      SpO2: 92% 93%  95%  Weight:   88.5 kg (195 lb)   Height:   5\' 10"  (1.778 m)    Eyes: PERRL, lids and conjunctivae normal ENMT: Mucous membranes are dry. Posterior pharynx clear of any exudate or lesions.Normal dentition.  Neck: normal, supple, no masses, no thyromegaly Respiratory: clear to auscultation bilaterally, no wheezing, no crackles. Normal respiratory effort. No accessory muscle use.  Cardiovascular: Regular rate and rhythm, positive systolic ejection murmur. No extremity edema. 2+ pedal pulses. No carotid bruits.  Abdomen: no tenderness, no masses palpated. No hepatosplenomegaly. Bowel sounds positive.  Musculoskeletal: no clubbing / cyanosis. Amputation of the first digit of the left foot. Tenderness to palpation of the left shoulder which is currently in sling. Skin: Pallor noted. Healing rash on the groin. Neurologic: CN 2-12 grossly intact. Sensation intact, DTR normal. Patient able to move all 4 extremities. Psychiatric: Confused. oriented only to  person.    Labs on Admission: I have personally reviewed following labs and imaging studies  CBC:  Recent Labs Lab 07/18/16 0357 07/19/16 0430 07/20/16 0434 07/21/16 0234 07/24/16 2149  WBC 11.9* 12.1* 11.9* 12.8* 12.6*  NEUTROABS  --   --   --   --  10.5*  HGB 9.7* 8.8* 8.2* 7.7* 7.3*  HCT 31.0* 28.3* 26.6* 24.8* 23.1*  MCV 90.1 89.8 90.2 89.9 89.5  PLT 232 233 248 267 [id number]   Basic Metabolic Panel:  Recent Labs Lab 07/19/16 0430 07/21/16 0234 07/24/16 2149  NA 133* 137 136  K 4.5 4.1 4.7  CL 96* 98* 99*  CO2 29 31 29   GLUCOSE 141* 129* 205*  BUN 63* 56* 44*  CREATININE 1.34* 1.08 1.26*  CALCIUM 9.3 9.7 9.1   GFR: Estimated Creatinine Clearance: 53.3 mL/min (by C-G formula based on SCr of 1.26 mg/dL (H)). Liver Function Tests:  Recent Labs Lab 07/24/16 2149  AST 50*  ALT 17  ALKPHOS 134*  BILITOT 0.4  PROT 6.3*  ALBUMIN 2.1*   No results for input(s): LIPASE, AMYLASE in the last 168 hours. No results for input(s): AMMONIA in the last 168 hours. Coagulation Profile:  Recent Labs Lab 07/18/16 0357 07/19/16 0430 07/20/16 0434 07/21/16 0234 07/24/16 2149  INR 1.30 1.43 1.67 1.80 3.60   Cardiac Enzymes: No results for input(s): CKTOTAL, CKMB, CKMBINDEX, TROPONINI in the last 168 hours. BNP (last 3 results) No results for input(s): PROBNP in the last 8760 hours. HbA1C: No results for input(s): HGBA1C in the last 72 hours. CBG:  Recent Labs Lab 07/20/16 2130 07/21/16 0641 07/21/16 1206 07/21/16 1721 07/24/16 2013  GLUCAP 118* 132* 129* 155* 238*   Lipid Profile: No results for input(s): CHOL, HDL, LDLCALC, TRIG, CHOLHDL, LDLDIRECT in the last 72 hours. Thyroid Function Tests: No results for input(s): TSH, T4TOTAL, FREET4, T3FREE, THYROIDAB in the last 72 hours. Anemia Panel: No results for input(s): VITAMINB12, FOLATE, FERRITIN, TIBC, IRON, RETICCTPCT in the last 72 hours. Urine analysis:    Component Value Date/Time   COLORURINE  YELLOW 01/14/2011 1149   APPEARANCEUR CLEAR 01/14/2011 1149   LABSPEC 1.011 01/14/2011 1149   PHURINE 7.0 01/14/2011 1149   GLUCOSEU NEGATIVE 01/14/2011 1149   HGBUR NEGATIVE 01/14/2011 [place] 01/14/2011 1149   KETONESUR NEGATIVE 01/14/2011 1149   PROTEINUR NEGATIVE 01/14/2011 1149   UROBILINOGEN 1.0 01/14/2011 1149   NITRITE NEGATIVE 01/14/2011 1149   LEUKOCYTESUR TRACE (A) 01/14/2011 1149   Sepsis Labs: Recent Results (from the past 240 hour(s))  Surgical PCR screen     Status: None   Collection Time: 07/16/16  6:27 AM  Result Value Ref Range Status   MRSA, PCR NEGATIVE NEGATIVE Final   Staphylococcus aureus NEGATIVE NEGATIVE Final    Comment:        The Xpert SA Assay (FDA approved for NASAL specimens in patients over 65 years of age), is one component of a comprehensive surveillance program.  Test performance has been validated by [facility] for patients greater than or equal to 32 year old. It is not intended to diagnose infection nor to guide or monitor treatment.      Radiological Exams on Admission: Dg Chest 1 View  Result Date: 07/24/2016 CLINICAL DATA:  [place] [facility] day nursing facility. EXAM: CHEST 1 VIEW COMPARISON:  07/14/2016 FINDINGS: Mildly comminuted and displaced proximal right humeral fracture. Moderate vascular and interstitial prominence, worsened. Central and basilar airspace opacities likely represent alveolar edema. Moderate right pleural effusion, unchanged or slightly enlarged. Unchanged cardiomegaly. The transvenous cardiac lead appears grossly intact. Prior sternotomy with CABG and aortic valvuloplasty. IMPRESSION: Interstitial and vascular changes, as well as central airspace opacities, consistent with congestive heart failure. Moderate right pleural effusion. Electronically Signed   By: Valerie Roys.D.  On: 07/24/2016 21:06   Dg Shoulder Right  Result Date: 07/24/2016 CLINICAL DATA:  Fall with right arm pain.  Humerus fracture on outside radiograph. EXAM: RIGHT SHOULDER - 2+ VIEW COMPARISON:  None. FINDINGS: Oblique fracture of the humeral neck is mildly comminuted. There is 18 mm lateral and mild anterior displacement of the humeral diaphysis. No evidence of extension to the glenohumeral joint. Chronic deformity of the right acromioclavicular joint fragmentation. Vascular calcifications are seen. IMPRESSION: Displaced mildly comminuted humeral neck fracture. No evidence of intra-articular extension. Electronically Signed   By: Jeb Levering M.D.   On: 07/24/2016 21:07   Ct Head Wo Contrast  Result Date: 07/24/2016 CLINICAL DATA:  Status post fall. EXAM: CT HEAD WITHOUT CONTRAST CT CERVICAL SPINE WITHOUT CONTRAST TECHNIQUE: Multidetector CT imaging of the head and cervical spine was performed following the standard protocol without intravenous contrast. Multiplanar CT image reconstructions of the cervical spine were also generated. COMPARISON:  Head CT dated 01/08/2011. FINDINGS: CT HEAD FINDINGS Brain: There is generalized age related parenchymal atrophy with commensurate dilatation of the ventricles and sulci. Mild chronic small vessel ischemic changes noted within the deep periventricular white matter regions bilaterally. There is no mass, hemorrhage, edema or other evidence of acute parenchymal abnormality. No extra-axial hemorrhage. Vascular: There are chronic calcified atherosclerotic changes of the large vessels at the skull base. No unexpected hyperdense vessel. Skull: Normal. Negative for fracture or focal lesion. Sinuses/Orbits: No acute finding. Other: None. CT CERVICAL SPINE FINDINGS Alignment: Slightly accentuated kyphosis of the lower cervical spine related to underlying degenerative change. No evidence of acute subluxation. Skull base and vertebrae: No fracture line or displaced fracture fragment identified. Soft tissues and spinal canal: No prevertebral fluid or swelling. No visible canal hematoma.  Disc levels: Degenerative change within the mid and lower cervical spine, mild to moderate in degree, with associated mild disc space narrowings and osseous spurring. No more than mild central canal stenosis at any level. Upper chest: Large right pleural effusion extending to the right lung apex, with probable adjacent atelectasis. Other: Heavy atherosclerotic calcifications at each carotid bulb region and at each carotid origin. IMPRESSION: 1. No acute intracranial abnormality. No intracranial mass, hemorrhage or edema. No skull fracture. Atrophy and chronic ischemic changes in the white matter. 2. No fracture or acute subluxation within the cervical spine. Degenerative/chronic findings of the cervical spine, as detailed above. 3. Large right pleural effusion, extending to the right lung apex, with probable adjacent atelectasis. 4. Carotid atherosclerosis. Electronically Signed   By: Franki Cabot M.D.   On: 07/24/2016 21:42   Ct Cervical Spine Wo Contrast  Result Date: 07/24/2016 CLINICAL DATA:  Status post fall. EXAM: CT HEAD WITHOUT CONTRAST CT CERVICAL SPINE WITHOUT CONTRAST TECHNIQUE: Multidetector CT imaging of the head and cervical spine was performed following the standard protocol without intravenous contrast. Multiplanar CT image reconstructions of the cervical spine were also generated. COMPARISON:  Head CT dated 01/08/2011. FINDINGS: CT HEAD FINDINGS Brain: There is generalized age related parenchymal atrophy with commensurate dilatation of the ventricles and sulci. Mild chronic small vessel ischemic changes noted within the deep periventricular white matter regions bilaterally. There is no mass, hemorrhage, edema or other evidence of acute parenchymal abnormality. No extra-axial hemorrhage. Vascular: There are chronic calcified atherosclerotic changes of the large vessels at the skull base. No unexpected hyperdense vessel. Skull: Normal. Negative for fracture or focal lesion. Sinuses/Orbits: No  acute finding. Other: None. CT CERVICAL SPINE FINDINGS Alignment: Slightly accentuated kyphosis of the lower cervical  spine related to underlying degenerative change. No evidence of acute subluxation. Skull base and vertebrae: No fracture line or displaced fracture fragment identified. Soft tissues and spinal canal: No prevertebral fluid or swelling. No visible canal hematoma. Disc levels: Degenerative change within the mid and lower cervical spine, mild to moderate in degree, with associated mild disc space narrowings and osseous spurring. No more than mild central canal stenosis at any level. Upper chest: Large right pleural effusion extending to the right lung apex, with probable adjacent atelectasis. Other: Heavy atherosclerotic calcifications at each carotid bulb region and at each carotid origin. IMPRESSION: 1. No acute intracranial abnormality. No intracranial mass, hemorrhage or edema. No skull fracture. Atrophy and chronic ischemic changes in the white matter. 2. No fracture or acute subluxation within the cervical spine. Degenerative/chronic findings of the cervical spine, as detailed above. 3. Large right pleural effusion, extending to the right lung apex, with probable adjacent atelectasis. 4. Carotid atherosclerosis. Electronically Signed   By: Franki Cabot M.D.   On: 07/24/2016 21:42    EKG: Independently reviewed. Ventricular paced rhythm  Assessment/Plan Right humeral neck fracture 2/2 Fall: Patient had been at camp in place and had gotten out of bed unattended and had fallen. - Admit to stepdown - placed right arm in sling  - May consider need of consultation to orthopedics surgery in a.m. after acute issues stabilize.  Acute blood loss anemia: Patient previously noted to have hemoglobin of 12 back in 05/2016, but has had a progressive decline in blood counts since that time down to 7.3 on admission today. Given vitamin K 1 mg orally in the ED along with 1 unit of PBRC. - Check stool  guaiac  - Serial H&H's - Goal hemoglobin 8 - Give 20 mg of Lasix following administration of blood products  Chronic atrial fibrillation on chronic anticoagulation therapy: Patient found to have supratherapeutic INR of 3.6. Given vitamin K 1 mg orally daily. CHA2DS2VASc score is 6. - Hold Coumadin - Recheck PT/INR in a.m.  Right-sided pleural effusion: Recurrent. Previously received thoracentesis during last hospitalization. - Consider need of repeat thoracentesis in am  Leukocytosis: WBC elevated at 12.6. - Check urinalysis - Continue to monitor  Hypotension: Initial blood pressures as low as 93/48. Suspect possibly secondary to blood loss versus dehydration versus other.  - Hold lisinopril  Chronic respiratory failure: Patient on home 2 L of nasal cannula oxygen -  continuous pulse oximetry with nasal cannula oxygen   Chronic kidney disease stage III: Stable -  F/u repeat bmp in am  Diastolic congestive heart failure: EF noted to be 55-65% with akinesis of the basal inferior myocardium. - Strict I&O  Coronary artery disease s/p CABG and PM for complete heart block. EKG shows a ventricularly paced rhythm  S/p AVR: Patient has a bioprosthetic valve.   Diabetes mellitus type 2 - hypoglycemic protocols - Continue home Lantus 15 units subcutaneously daily at bedtime - CBGs with sensitive SSI  Osteomyelitis of the left first metatarsal: Status post resection 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 head and sesmoid bone 5 years ago. Patient underwent arteriogram while hospitalized and was noted to have  completed antibiotics during hospitalization.  Dementia: Wife notes at baseline patient confusedBut is able to recognize family members. -  continue Aricept  DVT prophylaxis: scd   Code Status: Full Family Communication: Discussed plan of care with the patient has wife present at bedside  Disposition Plan: Likely discharge back In Place Was Medically Stable  Consults called: none Admission  status:  Inpatient  Norval Morton MD [facility] Pager [phone]  If 7PM-7AM, please contact night-coverage [email or web address] Password [email or web address]  07/24/2016, 11:11 PM

## 2016-07-25 NOTE — Progress Notes (Signed)
PROGRESS NOTE    [name].  [id number] DOB: 1935/05/07 DOA: 07/24/2016 PCP: [name], MD   Brief Narrative:  This is a 82 yo male with multiple medical problems, including atrial fibrillation on warfarin, dementia and recent foot osteomyelitis. Presents after a mechanica fall. Patient has been more lethargic and confused at the nursing home. On the initial examination, he was hemodynamically stable, decreased breath sounds at right base. Right shoulder was tender to palpation. He was found with a supra-therapeutic INR and acutely anemic. Right humeral fracture, admitted for further evaluation.     Assessment & Plan:   Principal Problem:   Right humeral fracture Active Problems:   DM (diabetes mellitus) type II uncontrolled, periph vascular disorder ([name])   S/P AVR (aortic valve replacement)   Chronic atrial fibrillation ([name])   Chronic diastolic heart failure ([name])   Pleural effusion   Anemia   Supratherapeutic INR   Chronic respiratory failure ([place])   Fall   Alzheimer's dementia   1. Right humeral fracture. Orthopedics consulted, will continue pain control with acetaminophen and will add morphine iV for sever pain, continue sling for immobilization for now.   2. Coagulopathy due to vitamin K antagonist. Will continue to hold on warfarin, patient had vitamin K yesterday, follow iNr in am, no signs of acute bleeding.   3. Chronic atrial fibrillation. Continue rate control, telemetry with paced rhythm.   4. Right sided pleural effusion. Chest film personally reviewed noted right pleural effusion, will follow on chest film will continue oxymetry monitoring and supplemental 02 per [place].    5. Acute anemia. Hb up to 8 after 1 unit prbc, will continue to follow hb and hct, no signs of acute bleeding, continue to hold on warfarin.   6. CKD stage 3. Will start patient on gentle hydration, very poor oral intake, follow renal function in am, avoid further hypotension, keep MAP  above 60. Hold on antihypertensive medications.   7. T2DM. Will continue glucose cover and monitoring, with iss, patient with very poor oral intake.   8. Dementia. Continue neuro checks per unit protocol. Continue donepezil   9. Hypotension. Hold on furosemide and antihypertensive medications.   DVT prophylaxis: high INR  Code Status: full  Family Communication: Patient's family at the bedside and all questions were addressed  Disposition Plan: SNF  Consultants:   Orthopedics  Procedures:   Antimicrobials:    Subjective: Patient with pain on his right shoulder and upper extremity, worse with movement, no radiation, no associated symptoms, most history from patient's family at the bedside. Patient with dementia.   Objective: Vitals:   07/25/16 1300 07/25/16 1315 07/25/16 1330 07/25/16 1400  BP: (!) 91/41 (!) 101/45 (!) 104/49 (!) 92/54  Pulse: 60 64 (!) 49 61  Resp: (!) 27 (!) 28 (!) 27 (!) 23  Temp:      TempSrc:      SpO2: 97% 98% 97% 96%  Weight:      Height:        Intake/Output Summary (Last 24 hours) at 07/25/16 1446 Last data filed at 07/25/16 1130  Gross per 24 hour  Intake             1037 ml  Output              525 ml  Net              512 ml   Filed Weights   07/24/16 2133 07/24/16 2234 07/25/16 0530  Weight: 88.5 kg (195 lb) 88.5 kg (195 lb) 89.2 kg (196 lb 9.6 oz)    Examination:  General exam: Appears calm and comfortable  E ENT: positive pallor, but no icterus, oral mucosa dry Respiratory system: Clear to auscultation. Respiratory effort normal. Mild decreased breath sounds at bases. No wheezing, rales or rhonchi.  Cardiovascular system: S1 & S2 heard, RRR. No JVD, murmurs, rubs, gallops or clicks. No pedal edema. Gastrointestinal system: Abdomen is nondistended, soft and nontender. No organomegaly or masses felt. Normal bowel sounds heard. Central nervous system: Alert and oriented. No focal neurological deficits. Extremities: Symmetric 5 x 5  power. Left lower extremity with dressing in place.  Skin: No rashes, lesions or ulcers  Data Reviewed: I have personally reviewed following labs and imaging studies  CBC:  Recent Labs Lab 07/19/16 0430 07/20/16 0434 07/21/16 0234 07/24/16 2149 07/25/16 0550 07/25/16 1343  WBC 12.1* 11.9* 12.8* 12.6* 10.8*  --   NEUTROABS  --   --   --  10.5*  --   --   HGB 8.8* 8.2* 7.7* 7.3* 8.4* 8.2*  HCT 28.3* 26.6* 24.8* 23.1* 27.4* 27.0*  MCV 89.8 90.2 89.9 89.5 89.0  --   PLT 233 248 267 240 290  --    Basic Metabolic Panel:  Recent Labs Lab 07/19/16 0430 07/21/16 0234 07/24/16 2149 07/25/16 0550  NA 133* 137 136 136  K 4.5 4.1 4.7 4.1  CL 96* 98* 99* 100*  CO2 29 31 29 31   GLUCOSE 141* 129* 205* 141*  BUN 63* 56* 44* 40*  CREATININE 1.34* 1.08 1.26* 1.19  CALCIUM 9.3 9.7 9.1 9.2   GFR: Estimated Creatinine Clearance: 56.6 mL/min (by C-G formula based on SCr of 1.19 mg/dL). Liver Function Tests:  Recent Labs Lab 07/24/16 2149  AST 50*  ALT 17  ALKPHOS 134*  BILITOT 0.4  PROT 6.3*  ALBUMIN 2.1*   No results for input(s): LIPASE, AMYLASE in the last 168 hours. No results for input(s): AMMONIA in the last 168 hours. Coagulation Profile:  Recent Labs Lab 07/19/16 0430 07/20/16 0434 07/21/16 0234 07/24/16 2149 07/25/16 0550  INR 1.43 1.67 1.80 3.60 3.31   Cardiac Enzymes: No results for input(s): CKTOTAL, CKMB, CKMBINDEX, TROPONINI in the last 168 hours. BNP (last 3 results) No results for input(s): PROBNP in the last 8760 hours. HbA1C: No results for input(s): HGBA1C in the last 72 hours. CBG:  Recent Labs Lab 07/21/16 1206 07/21/16 1721 07/24/16 2013 07/25/16 0739 07/25/16 1143  GLUCAP 129* 155* 238* 123* 111*   Lipid Profile: No results for input(s): CHOL, HDL, LDLCALC, TRIG, CHOLHDL, LDLDIRECT in the last 72 hours. Thyroid Function Tests: No results for input(s): TSH, T4TOTAL, FREET4, T3FREE, THYROIDAB in the last 72 hours. Anemia Panel: No  results for input(s): VITAMINB12, FOLATE, FERRITIN, TIBC, IRON, RETICCTPCT in the last 72 hours. Sepsis Labs: No results for input(s): PROCALCITON, LATICACIDVEN in the last 168 hours.  Recent Results (from the past 240 hour(s))  Surgical PCR screen     Status: None   Collection Time: 07/16/16  6:27 AM  Result Value Ref Range Status   MRSA, PCR NEGATIVE NEGATIVE Final   Staphylococcus aureus NEGATIVE NEGATIVE Final    Comment:        The Xpert SA Assay (FDA approved for NASAL specimens in patients over 5 years of age), is one component of a comprehensive surveillance program.  Test performance has been validated by [facility] for patients greater than or equal to 1  year old. It is not intended to diagnose infection nor to guide or monitor treatment.   MRSA PCR Screening     Status: None   Collection Time: 07/25/16  5:28 AM  Result Value Ref Range Status   MRSA by PCR NEGATIVE NEGATIVE Final    Comment:        The GeneXpert MRSA Assay (FDA approved for NASAL specimens only), is one component of a comprehensive MRSA colonization surveillance program. It is not intended to diagnose MRSA infection nor to guide or monitor treatment for MRSA infections.          Radiology Studies: Dg Chest 1 View  Result Date: 07/24/2016 CLINICAL DATA:  [place] tonight that day nursing facility. EXAM: CHEST 1 VIEW COMPARISON:  07/14/2016 FINDINGS: Mildly comminuted and displaced proximal right humeral fracture. Moderate vascular and interstitial prominence, worsened. Central and basilar airspace opacities likely represent alveolar edema. Moderate right pleural effusion, unchanged or slightly enlarged. Unchanged cardiomegaly. The transvenous cardiac lead appears grossly intact. Prior sternotomy with CABG and aortic valvuloplasty. IMPRESSION: Interstitial and vascular changes, as well as central airspace opacities, consistent with congestive heart failure. Moderate right pleural effusion.  Electronically Signed   By: Andreas Newport M.D.   On: 07/24/2016 21:06   Dg Shoulder Right  Result Date: 07/24/2016 CLINICAL DATA:  Fall with right arm pain. Humerus fracture on outside radiograph. EXAM: RIGHT SHOULDER - 2+ VIEW COMPARISON:  None. FINDINGS: Oblique fracture of the humeral neck is mildly comminuted. There is 18 mm lateral and mild anterior displacement of the humeral diaphysis. No evidence of extension to the glenohumeral joint. Chronic deformity of the right acromioclavicular joint fragmentation. Vascular calcifications are seen. IMPRESSION: Displaced mildly comminuted humeral neck fracture. No evidence of intra-articular extension. Electronically Signed   By: Jeb Levering M.D.   On: 07/24/2016 21:07   Ct Head Wo Contrast  Result Date: 07/24/2016 CLINICAL DATA:  Status post fall. EXAM: CT HEAD WITHOUT CONTRAST CT CERVICAL SPINE WITHOUT CONTRAST TECHNIQUE: Multidetector CT imaging of the head and cervical spine was performed following the standard protocol without intravenous contrast. Multiplanar CT image reconstructions of the cervical spine were also generated. COMPARISON:  Head CT dated 01/08/2011. FINDINGS: CT HEAD FINDINGS Brain: There is generalized age related parenchymal atrophy with commensurate dilatation of the ventricles and sulci. Mild chronic small vessel ischemic changes noted within the deep periventricular white matter regions bilaterally. There is no mass, hemorrhage, edema or other evidence of acute parenchymal abnormality. No extra-axial hemorrhage. Vascular: There are chronic calcified atherosclerotic changes of the large vessels at the skull base. No unexpected hyperdense vessel. Skull: Normal. Negative for fracture or focal lesion. Sinuses/Orbits: No acute finding. Other: None. CT CERVICAL SPINE FINDINGS Alignment: Slightly accentuated kyphosis of the lower cervical spine related to underlying degenerative change. No evidence of acute subluxation. Skull base and  vertebrae: No fracture line or displaced fracture fragment identified. Soft tissues and spinal canal: No prevertebral fluid or swelling. No visible canal hematoma. Disc levels: Degenerative change within the mid and lower cervical spine, mild to moderate in degree, with associated mild disc space narrowings and osseous spurring. No more than mild central canal stenosis at any level. Upper chest: Large right pleural effusion extending to the right lung apex, with probable adjacent atelectasis. Other: Heavy atherosclerotic calcifications at each carotid bulb region and at each carotid origin. IMPRESSION: 1. No acute intracranial abnormality. No intracranial mass, hemorrhage or edema. No skull fracture. Atrophy and chronic ischemic changes in the white matter. 2.  No fracture or acute subluxation within the cervical spine. Degenerative/chronic findings of the cervical spine, as detailed above. 3. Large right pleural effusion, extending to the right lung apex, with probable adjacent atelectasis. 4. Carotid atherosclerosis. Electronically Signed   By: Franki Cabot M.D.   On: 07/24/2016 21:42   Ct Cervical Spine Wo Contrast  Result Date: 07/24/2016 CLINICAL DATA:  Status post fall. EXAM: CT HEAD WITHOUT CONTRAST CT CERVICAL SPINE WITHOUT CONTRAST TECHNIQUE: Multidetector CT imaging of the head and cervical spine was performed following the standard protocol without intravenous contrast. Multiplanar CT image reconstructions of the cervical spine were also generated. COMPARISON:  Head CT dated 01/08/2011. FINDINGS: CT HEAD FINDINGS Brain: There is generalized age related parenchymal atrophy with commensurate dilatation of the ventricles and sulci. Mild chronic small vessel ischemic changes noted within the deep periventricular white matter regions bilaterally. There is no mass, hemorrhage, edema or other evidence of acute parenchymal abnormality. No extra-axial hemorrhage. Vascular: There are chronic calcified  atherosclerotic changes of the large vessels at the skull base. No unexpected hyperdense vessel. Skull: Normal. Negative for fracture or focal lesion. Sinuses/Orbits: No acute finding. Other: None. CT CERVICAL SPINE FINDINGS Alignment: Slightly accentuated kyphosis of the lower cervical spine related to underlying degenerative change. No evidence of acute subluxation. Skull base and vertebrae: No fracture line or displaced fracture fragment identified. Soft tissues and spinal canal: No prevertebral fluid or swelling. No visible canal hematoma. Disc levels: Degenerative change within the mid and lower cervical spine, mild to moderate in degree, with associated mild disc space narrowings and osseous spurring. No more than mild central canal stenosis at any level. Upper chest: Large right pleural effusion extending to the right lung apex, with probable adjacent atelectasis. Other: Heavy atherosclerotic calcifications at each carotid bulb region and at each carotid origin. IMPRESSION: 1. No acute intracranial abnormality. No intracranial mass, hemorrhage or edema. No skull fracture. Atrophy and chronic ischemic changes in the white matter. 2. No fracture or acute subluxation within the cervical spine. Degenerative/chronic findings of the cervical spine, as detailed above. 3. Large right pleural effusion, extending to the right lung apex, with probable adjacent atelectasis. 4. Carotid atherosclerosis. Electronically Signed   By: Franki Cabot M.D.   On: 07/24/2016 21:42        Scheduled Meds: . sodium chloride  10 mL/hr Intravenous Once  . aspirin EC  81 mg Oral Daily  . donepezil  10 mg Oral QHS  . ezetimibe-simvastatin  1 tablet Oral QHS  . furosemide  40 mg Oral BID  . insulin aspart  0-9 Units Subcutaneous TID WC  . [START ON 07/26/2016] insulin glargine  15 Units Subcutaneous QHS  . nystatin   Topical TID   Continuous Infusions:   LOS: 1 day      Seth Engelbert Gerome Apley, MD [facility] Pager [phone]  If 7PM-7AM, please contact night-coverage [email or web address] Password [facility] 07/25/2016, 2:46 PM

## 2016-07-25 NOTE — Progress Notes (Deleted)
Patient respiratory rate increased 30rpm, sats mid-70s on 2LNC. R lung sounds very quiet, not able to hear air movement compared to left side which is diminished but air movement heard, resps shallow and unlabored. Patient is very drowsy but arousable. [place] increased to 5L, sats now mid-90s with good waveform. Attending paged. Orders for stat ABG, port CXR and BiPAP, attending to come to bedside and assess.   Update: 8:18 PM ABG resulted, MD aware. Continue to watch patient for now, remaining on 5L O2. Will continue to monitor closely.

## 2016-07-25 NOTE — Progress Notes (Signed)
Orthopedic Tech Progress Note Patient Details:  [name] [birth year] [name]  Ortho Devices Type of Ortho Device: Sling immobilizer Ortho Device/Splint Location: rue Ortho Device/Splint Interventions: Ordered, Application   Karolee Stamps 07/25/2016, 12:48 AM

## 2016-07-25 NOTE — Progress Notes (Signed)
RT called to bedside by RN for STAT ABG and Bipap.  Upon entering room, patient noted to be resting comfortably with RR 20-24.  SpO2 98% on  5L [place].  Per RN, patient had desat episode and was confused following.  ABG obtained and results given to MD.  After discussion, decision made to withhold Bipap at this time, but continue to re-evaluate.  [place] titrated to 3lpm.  Reassessed at 2120.  SpO2 88% on 3L [place].  Patient sleeping at this time with mouth open  (?Hx OSA with non-compliance).  Changed to venti-mask at 40%.  SpO2 immediately rose back to 97% and held.    RT will continue to monitor.

## 2016-07-25 NOTE — Progress Notes (Signed)
Updates given to three family members, patients wife, daughter and sister via telephone; password provided. Updated on patient condition and plan of care. Family members expressed gratitude for updates and time taken to answer their questions.

## 2016-07-25 NOTE — Consult Note (Signed)
ORTHOPAEDIC CONSULTATION  REQUESTING PHYSICIAN: Mauricio Gerome Apley, *  Chief Complaint: Right shoulder pain.  HPI: [name]. is a 82 y.o. male who presents with right proximal humerus fracture status post ground level fall.  Past Medical History:  Diagnosis Date  . Aortal stenosis 2006   biophrostetic  . Arthritis   . Atrial fibrillation ([place])    onset after CABG--- coumadin management [facility]  . Carotid artery disease ([place]) 09/27/2010   Carotid US 12/17: bilat ICA 40-59 >> 1 year FU  . CHF (congestive heart failure) ([place])    abter CABG  . Complete heart block ([place])   . Coronary artery disease   . DM type 2 (diabetes mellitus, type 2) ([place])    used to see Dr Meredith Pel, now f/u by Dr Larose Kells  . Erectile dysfunction   . Gangrene ([place])   . History of nephrolithiasis   . HTN (hypertension)   . Hyperlipidemia   . Memory loss   . Obesity   . OSA (obstructive sleep apnea)    not using CPAP  . Pacemaker 2/07   VVI  . PVD (peripheral vascular disease) ([name])    CT angio showed- vascular insuff, intestine and RAS bilaterally, andgiogram 02/2009 severe PVD medical management  . S/P aortic valve replacement   . Sick sinus syndrome ([name])    [name]  . Skin cancer    surgery (nose) 09-2010   Past Surgical History:  Procedure Laterality Date  . ABDOMINAL AORTAGRAM N/A 11/10/2012   Procedure: ABDOMINAL Maxcine Ham;  Surgeon: Serafina Mitchell, MD;  Location: [facility] CATH LAB;  Service: Cardiovascular;  Laterality: N/A;  . AMPUTATION  01/2010   great toe  . AMPUTATION Left 07/16/2016   Procedure: RECECTION OF LEFT GREAT TOE METATARSAL HEAD and sesamoid;  Surgeon: Waynetta Sandy, MD;  Location: [place];  Service: Vascular;  Laterality: Left;  . ANGIOPLASTY / STENTING FEMORAL  02/10/10   Left femoral  . ANGIOPLASTY ILLIAC ARTERY Left 07/16/2016   Procedure: drug coated Balloon ANGIOPLASTY of left superficial femoral artry stent;  Surgeon: Waynetta Sandy, MD;  Location: [place];  Service: Vascular;  Laterality: Left;  . AORTIC VALVE REPLACEMENT     biophrostetic for Ao Stenosis 2006  . AORTIC VALVE REPLACEMENT  01/17/2011   S/P redo median sternotomy, extracorporeal circulation, redo Aortic Valve Replacement using a 23-mm Edwards pericardial Magna-Ease valve, Dr Ulla Gallo  . AORTOGRAM N/A 07/16/2016   Procedure: left lower extremity angiogram;  Surgeon: Waynetta Sandy, MD;  Location: [facility] OR;  Service: Vascular;  Laterality: N/A;  . CATARACT EXTRACTION, BILATERAL    . CORONARY ARTERY BYPASS GRAFT  2006  . EP IMPLANTABLE DEVICE N/A 06/06/2016   Procedure: PPM Generator Changeout;  Surgeon: Thompson Grayer, MD;  Location: [place] CV LAB;  Service: Cardiovascular;  Laterality: N/A;  . EYE SURGERY    . ILIAC ATHERECTOMY Left 07/16/2016   Procedure: Angioplasty and ATHERECTOMY of left anterior tibial artery;  Surgeon: Waynetta Sandy, MD;  Location: [place];  Service: Vascular;  Laterality: Left;  . INCISE AND DRAIN ABCESS Right May 2014   right heel   . LOWER EXTREMITY ANGIOGRAM Left 11/23/2012   Procedure: LOWER EXTREMITY ANGIOGRAM;  Surgeon: Serafina Mitchell, MD;  Location: [facility] CATH LAB;  Service: Cardiovascular;  Laterality: Left;  . PACEMAKER PLACEMENT  07/2005   Medtronic Sigma SR implanted by Dr Olevia Perches  . PERIPHERAL VASCULAR CATHETERIZATION N/A 07/10/2016   Procedure:  Abdominal Aortogram;  Surgeon: Conrad Greenland, MD;  Location: [place] CV LAB;  Service: Cardiovascular;  Laterality: N/A;  . PERIPHERAL VASCULAR CATHETERIZATION Bilateral 07/10/2016   Procedure: Lower Extremity Angiography;  Surgeon: Conrad , MD;  Location: [place] CV LAB;  Service: Cardiovascular;  Laterality: Bilateral;  . TONSILLECTOMY    . ULTRASOUND GUIDANCE FOR VASCULAR ACCESS Bilateral 07/16/2016   Procedure: ULTRASOUND GUIDANCE FOR VASCULAR ACCESS of right common femoral and left dorsalis pedis;  Surgeon: Waynetta Sandy,  MD;  Location: [facility] OR;  Service: Vascular;  Laterality: Bilateral;   Social History   Social History  . Marital status: Married    Spouse name: N/A  . Number of children: 2  . Years of education: N/A   Occupational History  . retired- long distance Administrator  Retired   Social History Main Topics  . Smoking status: Former Smoker    Types: Cigarettes    Quit date: 06/23/1988  . Smokeless tobacco: Former Systems developer  . Alcohol use No  . Drug use: No  . Sexual activity: Not Currently   Other Topics Concern  . None   Social History Narrative   Diet: healthy most of the time--   2 step children      Patient drinks about 3-4 cups of caffeine daily.   Patient is right handed.             Family History  Problem Relation Age of Onset  . Heart attack Father 60  . Heart disease Father     Before age 26  . Hypertension Father   . Stroke Mother 57  . Cancer Sister     Cervical  . Heart attack Sister   . Heart disease Sister   . Diabetes Sister     2 sister w/ DM  . Heart attack Sister   . Heart attack Sister   . Heart disease Sister 40    AAA  and  Stomach Aneurysm  . AAA (abdominal aortic aneurysm) Sister   . Heart disease Sister     Before age 28  . Heart attack Sister 57  . Colon cancer Neg Hx   . Prostate cancer Neg Hx    - negative except otherwise stated in the family history section Allergies  Allergen Reactions  . Potassium-Containing Compounds Anaphylaxis    IV--loss of conciousness  . Metoprolol Succinate Other (See Comments)    Extremely tired   Prior to Admission medications   Medication Sig Start Date End Date Taking? Authorizing Provider  acetaminophen (TYLENOL) 325 MG tablet Take 2 tablets (650 mg total) by mouth every 4 (four) hours as needed for headache or mild pain. 07/21/16  Yes Oswald Hillock, MD  aspirin EC 81 MG EC tablet Take 1 tablet (81 mg total) by mouth daily. 07/22/16  Yes Oswald Hillock, MD  donepezil (ARICEPT) 10 MG tablet Take 1 tablet (10  mg total) by mouth at bedtime. 05/08/16  Yes Kathrynn Ducking, MD  ezetimibe-simvastatin (VYTORIN) 10-40 MG per tablet Take 1 tablet by mouth at bedtime. 05/06/12  Yes Larey Dresser, MD  furosemide (LASIX) 40 MG tablet Take 1 tablet (40 mg total) by mouth 2 (two) times daily. 07/21/16  Yes Oswald Hillock, MD  insulin aspart (NOVOLOG) 100 UNIT/ML injection Inject 0-9 Units into the skin 3 (three) times daily with meals. Sliding scale insulin Less than 70 initiate hypoglycemia protocol 70-120  0 units 120-150 1 unit 151-200 2 units 201-250  3 units 251-300 5 units 301-350 7 units 351-400 9 units  Greater than 400 call MD 07/21/16  Yes Oswald Hillock, MD  insulin glargine (LANTUS) 100 UNIT/ML injection Inject 0.15 mLs (15 Units total) into the skin at bedtime. 06/07/14  Yes Belkys A Regalado, MD  lisinopril (PRINIVIL,ZESTRIL) 10 MG tablet Take 1 tablet (10 mg total) by mouth daily. 07/25/15  Yes Liliane Shi, PA-C  Multiple Vitamins-Minerals (DECUBI-VITE) CAPS Take 1 capsule by mouth daily.   Yes Historical Provider, MD  nystatin (MYCOSTATIN/NYSTOP) powder Apply topically 3 (three) times daily. Apply to groin daily 07/21/16 07/28/16 Yes Oswald Hillock, MD  OVER THE COUNTER MEDICATION Take 120 mLs by mouth daily. Med pass   Yes Historical Provider, MD  warfarin (COUMADIN) 3 MG tablet Take 4.5 mg by mouth daily at 6 PM.   Yes Historical Provider, MD   Dg Chest 1 View  Result Date: 07/24/2016 CLINICAL DATA:  [facility] day nursing facility. EXAM: CHEST 1 VIEW COMPARISON:  07/14/2016 FINDINGS: Mildly comminuted and displaced proximal right humeral fracture. Moderate vascular and interstitial prominence, worsened. Central and basilar airspace opacities likely represent alveolar edema. Moderate right pleural effusion, unchanged or slightly enlarged. Unchanged cardiomegaly. The transvenous cardiac lead appears grossly intact. Prior sternotomy with CABG and aortic valvuloplasty. IMPRESSION: Interstitial and  vascular changes, as well as central airspace opacities, consistent with congestive heart failure. Moderate right pleural effusion. Electronically Signed   By: Andreas Newport M.D.   On: 07/24/2016 21:06   Dg Shoulder Right  Result Date: 07/24/2016 CLINICAL DATA:  Fall with right arm pain. Humerus fracture on outside radiograph. EXAM: RIGHT SHOULDER - 2+ VIEW COMPARISON:  None. FINDINGS: Oblique fracture of the humeral neck is mildly comminuted. There is 18 mm lateral and mild anterior displacement of the humeral diaphysis. No evidence of extension to the glenohumeral joint. Chronic deformity of the right acromioclavicular joint fragmentation. Vascular calcifications are seen. IMPRESSION: Displaced mildly comminuted humeral neck fracture. No evidence of intra-articular extension. Electronically Signed   By: Jeb Levering M.D.   On: 07/24/2016 21:07   Ct Head Wo Contrast  Result Date: 07/24/2016 CLINICAL DATA:  Status post fall. EXAM: CT HEAD WITHOUT CONTRAST CT CERVICAL SPINE WITHOUT CONTRAST TECHNIQUE: Multidetector CT imaging of the head and cervical spine was performed following the standard protocol without intravenous contrast. Multiplanar CT image reconstructions of the cervical spine were also generated. COMPARISON:  Head CT dated 01/08/2011. FINDINGS: CT HEAD FINDINGS Brain: There is generalized age related parenchymal atrophy with commensurate dilatation of the ventricles and sulci. Mild chronic small vessel ischemic changes noted within the deep periventricular white matter regions bilaterally. There is no mass, hemorrhage, edema or other evidence of acute parenchymal abnormality. No extra-axial hemorrhage. Vascular: There are chronic calcified atherosclerotic changes of the large vessels at the skull base. No unexpected hyperdense vessel. Skull: Normal. Negative for fracture or focal lesion. Sinuses/Orbits: No acute finding. Other: None. CT CERVICAL SPINE FINDINGS Alignment: Slightly  accentuated kyphosis of the lower cervical spine related to underlying degenerative change. No evidence of acute subluxation. Skull base and vertebrae: No fracture line or displaced fracture fragment identified. Soft tissues and spinal canal: No prevertebral fluid or swelling. No visible canal hematoma. Disc levels: Degenerative change within the mid and lower cervical spine, mild to moderate in degree, with associated mild disc space narrowings and osseous spurring. No more than mild central canal stenosis at any level. Upper chest: Large right pleural effusion extending to the right lung  apex, with probable adjacent atelectasis. Other: Heavy atherosclerotic calcifications at each carotid bulb region and at each carotid origin. IMPRESSION: 1. No acute intracranial abnormality. No intracranial mass, hemorrhage or edema. No skull fracture. Atrophy and chronic ischemic changes in the white matter. 2. No fracture or acute subluxation within the cervical spine. Degenerative/chronic findings of the cervical spine, as detailed above. 3. Large right pleural effusion, extending to the right lung apex, with probable adjacent atelectasis. 4. Carotid atherosclerosis. Electronically Signed   By: Franki Cabot M.D.   On: 07/24/2016 21:42   Ct Cervical Spine Wo Contrast  Result Date: 07/24/2016 CLINICAL DATA:  Status post fall. EXAM: CT HEAD WITHOUT CONTRAST CT CERVICAL SPINE WITHOUT CONTRAST TECHNIQUE: Multidetector CT imaging of the head and cervical spine was performed following the standard protocol without intravenous contrast. Multiplanar CT image reconstructions of the cervical spine were also generated. COMPARISON:  Head CT dated 01/08/2011. FINDINGS: CT HEAD FINDINGS Brain: There is generalized age related parenchymal atrophy with commensurate dilatation of the ventricles and sulci. Mild chronic small vessel ischemic changes noted within the deep periventricular white matter regions bilaterally. There is no mass,  hemorrhage, edema or other evidence of acute parenchymal abnormality. No extra-axial hemorrhage. Vascular: There are chronic calcified atherosclerotic changes of the large vessels at the skull base. No unexpected hyperdense vessel. Skull: Normal. Negative for fracture or focal lesion. Sinuses/Orbits: No acute finding. Other: None. CT CERVICAL SPINE FINDINGS Alignment: Slightly accentuated kyphosis of the lower cervical spine related to underlying degenerative change. No evidence of acute subluxation. Skull base and vertebrae: No fracture line or displaced fracture fragment identified. Soft tissues and spinal canal: No prevertebral fluid or swelling. No visible canal hematoma. Disc levels: Degenerative change within the mid and lower cervical spine, mild to moderate in degree, with associated mild disc space narrowings and osseous spurring. No more than mild central canal stenosis at any level. Upper chest: Large right pleural effusion extending to the right lung apex, with probable adjacent atelectasis. Other: Heavy atherosclerotic calcifications at each carotid bulb region and at each carotid origin. IMPRESSION: 1. No acute intracranial abnormality. No intracranial mass, hemorrhage or edema. No skull fracture. Atrophy and chronic ischemic changes in the white matter. 2. No fracture or acute subluxation within the cervical spine. Degenerative/chronic findings of the cervical spine, as detailed above. 3. Large right pleural effusion, extending to the right lung apex, with probable adjacent atelectasis. 4. Carotid atherosclerosis. Electronically Signed   By: Franki Cabot M.D.   On: 07/24/2016 21:42   - pertinent xrays, CT, MRI studies were reviewed and independently interpreted  Positive ROS: All other systems have been reviewed and were otherwise negative with the exception of those mentioned in the HPI and as above.  Physical Exam: General: Alert, no acute distress Psychiatric: Patient is competent for  consent with normal mood and affect Lymphatic: No axillary or cervical lymphadenopathy Cardiovascular: No pedal edema Respiratory: No cyanosis, on nasal cannula FiO2 with use of accessory musculature GI: No organomegaly, abdomen is soft and non-tender  Skin: Intact no open wounds no ecchymosis   Neurologic: Patient does  have protective sensation bilateral lower extremities.   MUSCULOSKELETAL:  On examination patient is in a sling his head is elevated in bed patient does have shortness of breath. Examination the right upper extremity is neurovascularly intact. Radiograph shows a minimally displaced right proximal humeral neck fracture. The glenohumeral joint is congruent.  Assessment: Assessment: Right proximal humeral neck fractures with multiple medical problems.  Plan: Plan:  Continue with sling immobilization. Recommend with the patient and family that he would be most comfortable with his head elevated or in a reclining chair. Will hold on mobilization of the shoulder for 2 weeks I will review reevaluate in 2 weeks with repeat radiographs and anticipate starting range of motion at that time. I will follow-up in the office in 2 weeks.  Thank you for the consult and the opportunity to see [name], [place] [phone] 5:28 PM

## 2016-07-26 DIAGNOSIS — F0280 Dementia in other diseases classified elsewhere without behavioral disturbance: Secondary | ICD-10-CM

## 2016-07-26 DIAGNOSIS — G309 Alzheimer's disease, unspecified: Secondary | ICD-10-CM

## 2016-07-26 LAB — GLUCOSE, CAPILLARY
GLUCOSE-CAPILLARY: 99 mg/dL (ref 65–99)
Glucose-Capillary: 109 mg/dL — ABNORMAL HIGH (ref 65–99)
Glucose-Capillary: 130 mg/dL — ABNORMAL HIGH (ref 65–99)
Glucose-Capillary: 172 mg/dL — ABNORMAL HIGH (ref 65–99)

## 2016-07-26 LAB — CBC WITH DIFFERENTIAL/PLATELET
Basophils Absolute: 0 10*3/uL (ref 0.0–0.1)
Basophils Relative: 0 %
Eosinophils Absolute: 0.2 10*3/uL (ref 0.0–0.7)
Eosinophils Relative: 2 %
HEMATOCRIT: 29.1 % — AB (ref 39.0–52.0)
HEMOGLOBIN: 8.9 g/dL — AB (ref 13.0–17.0)
LYMPHS ABS: 1.4 10*3/uL (ref 0.7–4.0)
Lymphocytes Relative: 13 %
MCH: 27.2 pg (ref 26.0–34.0)
MCHC: 30.6 g/dL (ref 30.0–36.0)
MCV: 89 fL (ref 78.0–100.0)
MONOS PCT: 9 %
Monocytes Absolute: 0.9 10*3/uL (ref 0.1–1.0)
NEUTROS ABS: 8.1 10*3/uL — AB (ref 1.7–7.7)
NEUTROS PCT: 76 %
Platelets: 326 10*3/uL (ref 150–400)
RBC: 3.27 MIL/uL — AB (ref 4.22–5.81)
RDW: 15.7 % — ABNORMAL HIGH (ref 11.5–15.5)
WBC: 10.6 10*3/uL — AB (ref 4.0–10.5)

## 2016-07-26 LAB — BASIC METABOLIC PANEL
Anion gap: 6 (ref 5–15)
BUN: 43 mg/dL — ABNORMAL HIGH (ref 6–20)
CHLORIDE: 100 mmol/L — AB (ref 101–111)
CO2: 31 mmol/L (ref 22–32)
Calcium: 9.3 mg/dL (ref 8.9–10.3)
Creatinine, Ser: 1.36 mg/dL — ABNORMAL HIGH (ref 0.61–1.24)
GFR calc non Af Amer: 48 mL/min — ABNORMAL LOW (ref >60)
GFR, EST AFRICAN AMERICAN: 55 mL/min — AB (ref >60)
Glucose, Bld: 119 mg/dL — ABNORMAL HIGH (ref 65–99)
POTASSIUM: 4.9 mmol/L (ref 3.5–5.1)
SODIUM: 137 mmol/L (ref 135–145)

## 2016-07-26 LAB — PROTIME-INR
INR: 2.62
INR: 2.6
PROTHROMBIN TIME: 28.5 s — AB (ref 11.4–15.2)
Prothrombin Time: 28.4 seconds — ABNORMAL HIGH (ref 11.4–15.2)

## 2016-07-26 MED ORDER — PHYTONADIONE 5 MG PO TABS
5.0000 mg | ORAL_TABLET | Freq: Once | ORAL | Status: AC
  Administered 2016-07-26: 5 mg via ORAL
  Filled 2016-07-26: qty 1

## 2016-07-26 NOTE — Progress Notes (Signed)
PROGRESS NOTE    [name].  SG:[id number] DOB: Oct 31, 1934 DOA: 07/24/2016 PCP: [name], MD    Brief Narrative:  This is a 82 yo male with multiple medical problems, including atrial fibrillation on warfarin, dementia and recent foot osteomyelitis. Presents after a mechanica fall. Patient has been more lethargic and confused at the nursing home. On the initial examination, he was hemodynamically stable, decreased breath sounds at right base. Right shoulder was tender to palpation. He was found with a supra-therapeutic INR and acutely anemic. Right humeral fracture, admitted for further evaluation. Right pleural effusion with hypoxic respiratory failure.   Assessment & Plan:   Principal Problem:   Right humeral fracture Active Problems:   DM (diabetes mellitus) type II uncontrolled, periph vascular disorder ([name])   S/P AVR (aortic valve replacement)   Chronic atrial fibrillation ([name])   Chronic diastolic heart failure ([name])   Pleural effusion   Anemia   Supratherapeutic INR   Chronic respiratory failure ([place])   Fall   Alzheimer's dementia   Fracture of humerus neck, right, closed, initial encounter   1. Right humeral fracture. Orthopedics has been consulted, continue immobilization.  Pain control with acetaminophen and morphine iV for sever pain.  2. Coagulopathy due to vitamin K antagonist. Persistent elevation of INR, patient will need thoracentesis, will give 5 mg of vitamin K and follow on INR this pm.    3. Chronic atrial fibrillation. Continue rate control, as needed metoprolol, telemetry with paced rhythm.   4. Right sided pleural effusion with acute respiratory failure. Will revert INR with intention to proceed with thoracentesis. Will continue oxymetry monitoring, will keep patient seated position at 45 degrees.   5. Acute anemia. Hb stable at 8.9, will continue follow on cell count in am, no signs of bleeding.   6. CKD stage 3. Very poor oral intake, will  continue to hold on IV fluids for now to prevent volume overload and worsening respiratory compromise. Renal function with stable cr at 1.36, will continue to hold on diuretics for now. Avoid hypotension or nephrotoxic medications.    7. T2DM. Continue glucose cover and monitoring, with iss, patient with very poor oral intake. Capillary glucose [id number]   8. Dementia. Continue neuro checks per unit protocol. Continue donepezil. No agitation, continue pain control.  9. Hypotension. Hold on furosemide and antihypertensive medications. Systolic blood pressure [id number] to 110.    DVT prophylaxis: coagulopathic  Code Status: full  Family Communication: No family at the bedside  Disposition Plan: snf.   Consultants:   Orthopedics.   Procedures:  Antimicrobials:    Subjective: Patient developed tachypnea and desaturation, required increase supplemental 02 per [place]. This am, with pain in his right upper extremity, with no dyspnea or chest pain, no nausea or vomiting.   Objective: Vitals:   07/26/16 0600 07/26/16 0615 07/26/16 0630 07/26/16 0747  BP: 96/75 (!) 132/55 (!) 113/53 (!) 106/52  Pulse: 60 (!) 59 (!) 58 (!) 58  Resp: 20 17 17  (!) 26  Temp:    97.3 F (36.3 C)  TempSrc:    Oral  SpO2: 100% 99% 100% 99%  Weight:      Height:        Intake/Output Summary (Last 24 hours) at 07/26/16 0804 Last data filed at 07/26/16 0600  Gross per 24 hour  Intake              702 ml  Output  1150 ml  Net             -448 ml   Filed Weights   07/24/16 2133 07/24/16 2234 07/25/16 0530  Weight: 88.5 kg (195 lb) 88.5 kg (195 lb) 89.2 kg (196 lb 9.6 oz)    Examination:  General exam: deconditioned, not in pain. E ENT: mild pallor, no icterus, oral mucosa dry.  Respiratory system: decreased breath sounds at bases, more right than left. Bibasilar rales, no wheezing or rhonchi.  Cardiovascular system: S1 & S2 heard, RRR. No JVD, murmurs, rubs, gallops or clicks. No pedal  edema. Gastrointestinal system: Abdomen is nondistended, soft and nontender. No organomegaly or masses felt. Normal bowel sounds heard. Central nervous system: Alert and oriented. No focal neurological deficits. Extremities: Symmetric 5 x 5 power. Left foot with dressing in place.  Skin: No rashes, lesions or ulcers  Data Reviewed: I have personally reviewed following labs and imaging studies  CBC:  Recent Labs Lab 07/21/16 0234 07/24/16 2149 07/25/16 0550 07/25/16 1343 07/25/16 2046 07/26/16 0414  WBC 12.8* 12.6* 10.8*  --  9.9 10.6*  NEUTROABS  --  10.5*  --   --  6.9 8.1*  HGB 7.7* 7.3* 8.4* 8.2* 8.3* 8.9*  HCT 24.8* 23.1* 27.4* 27.0* 26.9* 29.1*  MCV 89.9 89.5 89.0  --  88.5 89.0  PLT 267 240 290  --  301 [id number]   Basic Metabolic Panel:  Recent Labs Lab 07/21/16 0234 07/24/16 2149 07/25/16 0550 07/25/16 2046 07/26/16 0414  NA 137 136 136 138 137  K 4.1 4.7 4.1 4.6 4.9  CL 98* 99* 100* 98* 100*  CO2 31 29 31 31 31   GLUCOSE 129* 205* 141* 83 119*  BUN 56* 44* 40* 45* 43*  CREATININE 1.08 1.26* 1.19 1.36* 1.36*  CALCIUM 9.7 9.1 9.2 9.2 9.3   GFR: Estimated Creatinine Clearance: 49.5 mL/min (by C-G formula based on SCr of 1.36 mg/dL (H)). Liver Function Tests:  Recent Labs Lab 07/24/16 2149  AST 50*  ALT 17  ALKPHOS 134*  BILITOT 0.4  PROT 6.3*  ALBUMIN 2.1*   No results for input(s): LIPASE, AMYLASE in the last 168 hours. No results for input(s): AMMONIA in the last 168 hours. Coagulation Profile:  Recent Labs Lab 07/20/16 0434 07/21/16 0234 07/24/16 2149 07/25/16 0550 07/26/16 0414  INR 1.67 1.80 3.60 3.31 2.62   Cardiac Enzymes: No results for input(s): CKTOTAL, CKMB, CKMBINDEX, TROPONINI in the last 168 hours. BNP (last 3 results) No results for input(s): PROBNP in the last 8760 hours. HbA1C: No results for input(s): HGBA1C in the last 72 hours. CBG:  Recent Labs Lab 07/25/16 0739 07/25/16 1143 07/25/16 1546 07/25/16 2044  07/26/16 0746  GLUCAP 123* 111* 152* 78 109*   Lipid Profile: No results for input(s): CHOL, HDL, LDLCALC, TRIG, CHOLHDL, LDLDIRECT in the last 72 hours. Thyroid Function Tests: No results for input(s): TSH, T4TOTAL, FREET4, T3FREE, THYROIDAB in the last 72 hours. Anemia Panel: No results for input(s): VITAMINB12, FOLATE, FERRITIN, TIBC, IRON, RETICCTPCT in the last 72 hours. Sepsis Labs:  Recent Labs Lab 07/25/16 2046  LATICACIDVEN 0.6    Recent Results (from the past 240 hour(s))  MRSA PCR Screening     Status: None   Collection Time: 07/25/16  5:28 AM  Result Value Ref Range Status   MRSA by PCR NEGATIVE NEGATIVE Final    Comment:        The GeneXpert MRSA Assay (FDA approved for NASAL specimens only), is one component  of a comprehensive MRSA colonization surveillance program. It is not intended to diagnose MRSA infection nor to guide or monitor treatment for MRSA infections.          Radiology Studies: Dg Chest 1 View  Result Date: 07/24/2016 CLINICAL DATA:  [place] tonight [facility] day nursing facility. EXAM: CHEST 1 VIEW COMPARISON:  07/14/2016 FINDINGS: Mildly comminuted and displaced proximal right humeral fracture. Moderate vascular and interstitial prominence, worsened. Central and basilar airspace opacities likely represent alveolar edema. Moderate right pleural effusion, unchanged or slightly enlarged. Unchanged cardiomegaly. The transvenous cardiac lead appears grossly intact. Prior sternotomy with CABG and aortic valvuloplasty. IMPRESSION: Interstitial and vascular changes, as well as central airspace opacities, consistent with congestive heart failure. Moderate right pleural effusion. Electronically Signed   By: Andreas Newport M.D.   On: 07/24/2016 21:06   Dg Shoulder Right  Result Date: 07/24/2016 CLINICAL DATA:  Fall with right arm pain. Humerus fracture on outside radiograph. EXAM: RIGHT SHOULDER - 2+ VIEW COMPARISON:  None. FINDINGS: Oblique fracture of the  humeral neck is mildly comminuted. There is 18 mm lateral and mild anterior displacement of the humeral diaphysis. No evidence of extension to the glenohumeral joint. Chronic deformity of the right acromioclavicular joint fragmentation. Vascular calcifications are seen. IMPRESSION: Displaced mildly comminuted humeral neck fracture. No evidence of intra-articular extension. Electronically Signed   By: Jeb Levering M.D.   On: 07/24/2016 21:07   Ct Head Wo Contrast  Result Date: 07/24/2016 CLINICAL DATA:  Status post fall. EXAM: CT HEAD WITHOUT CONTRAST CT CERVICAL SPINE WITHOUT CONTRAST TECHNIQUE: Multidetector CT imaging of the head and cervical spine was performed following the standard protocol without intravenous contrast. Multiplanar CT image reconstructions of the cervical spine were also generated. COMPARISON:  Head CT dated 01/08/2011. FINDINGS: CT HEAD FINDINGS Brain: There is generalized age related parenchymal atrophy with commensurate dilatation of the ventricles and sulci. Mild chronic small vessel ischemic changes noted within the deep periventricular white matter regions bilaterally. There is no mass, hemorrhage, edema or other evidence of acute parenchymal abnormality. No extra-axial hemorrhage. Vascular: There are chronic calcified atherosclerotic changes of the large vessels at the skull base. No unexpected hyperdense vessel. Skull: Normal. Negative for fracture or focal lesion. Sinuses/Orbits: No acute finding. Other: None. CT CERVICAL SPINE FINDINGS Alignment: Slightly accentuated kyphosis of the lower cervical spine related to underlying degenerative change. No evidence of acute subluxation. Skull base and vertebrae: No fracture line or displaced fracture fragment identified. Soft tissues and spinal canal: No prevertebral fluid or swelling. No visible canal hematoma. Disc levels: Degenerative change within the mid and lower cervical spine, mild to moderate in degree, with associated mild  disc space narrowings and osseous spurring. No more than mild central canal stenosis at any level. Upper chest: Large right pleural effusion extending to the right lung apex, with probable adjacent atelectasis. Other: Heavy atherosclerotic calcifications at each carotid bulb region and at each carotid origin. IMPRESSION: 1. No acute intracranial abnormality. No intracranial mass, hemorrhage or edema. No skull fracture. Atrophy and chronic ischemic changes in the white matter. 2. No fracture or acute subluxation within the cervical spine. Degenerative/chronic findings of the cervical spine, as detailed above. 3. Large right pleural effusion, extending to the right lung apex, with probable adjacent atelectasis. 4. Carotid atherosclerosis. Electronically Signed   By: Franki Cabot M.D.   On: 07/24/2016 21:42   Ct Cervical Spine Wo Contrast  Result Date: 07/24/2016 CLINICAL DATA:  Status post fall. EXAM: CT HEAD WITHOUT CONTRAST  CT CERVICAL SPINE WITHOUT CONTRAST TECHNIQUE: Multidetector CT imaging of the head and cervical spine was performed following the standard protocol without intravenous contrast. Multiplanar CT image reconstructions of the cervical spine were also generated. COMPARISON:  Head CT dated 01/08/2011. FINDINGS: CT HEAD FINDINGS Brain: There is generalized age related parenchymal atrophy with commensurate dilatation of the ventricles and sulci. Mild chronic small vessel ischemic changes noted within the deep periventricular white matter regions bilaterally. There is no mass, hemorrhage, edema or other evidence of acute parenchymal abnormality. No extra-axial hemorrhage. Vascular: There are chronic calcified atherosclerotic changes of the large vessels at the skull base. No unexpected hyperdense vessel. Skull: Normal. Negative for fracture or focal lesion. Sinuses/Orbits: No acute finding. Other: None. CT CERVICAL SPINE FINDINGS Alignment: Slightly accentuated kyphosis of the lower cervical spine  related to underlying degenerative change. No evidence of acute subluxation. Skull base and vertebrae: No fracture line or displaced fracture fragment identified. Soft tissues and spinal canal: No prevertebral fluid or swelling. No visible canal hematoma. Disc levels: Degenerative change within the mid and lower cervical spine, mild to moderate in degree, with associated mild disc space narrowings and osseous spurring. No more than mild central canal stenosis at any level. Upper chest: Large right pleural effusion extending to the right lung apex, with probable adjacent atelectasis. Other: Heavy atherosclerotic calcifications at each carotid bulb region and at each carotid origin. IMPRESSION: 1. No acute intracranial abnormality. No intracranial mass, hemorrhage or edema. No skull fracture. Atrophy and chronic ischemic changes in the white matter. 2. No fracture or acute subluxation within the cervical spine. Degenerative/chronic findings of the cervical spine, as detailed above. 3. Large right pleural effusion, extending to the right lung apex, with probable adjacent atelectasis. 4. Carotid atherosclerosis. Electronically Signed   By: Franki Cabot M.D.   On: 07/24/2016 21:42   Dg Chest Port 1 View  Result Date: 07/25/2016 CLINICAL DATA:  Respiratory difficulty EXAM: PORTABLE CHEST 1 VIEW COMPARISON:  Earlier today FINDINGS: Moderate to large layering right pleural effusion. There is chronic cardiomegaly. The patient is status post CABG and aortic valve replacement. Single chamber pacer from the left in stable position. Pulmonary venous congestion. IMPRESSION: Stable compared to earlier today. Cardiomegaly and venous congestion with moderate to large layering pleural effusion on the right. Electronically Signed   By: Monte Fantasia M.D.   On: 07/25/2016 20:24   Dg Chest Port 1 View  Result Date: 07/25/2016 CLINICAL DATA:  Dyspnea, desaturation when lying flat EXAM: PORTABLE CHEST 1 VIEW COMPARISON:  Portable  exam 1745 hours compared to 07/24/2016 FINDINGS: LEFT subclavian pacemaker lead projects over RIGHT ventricle. Enlargement of cardiac silhouette post CABG and AVR. Pulmonary vascular congestion. RIGHT pleural effusion and basilar atelectasis. Perihilar edema. No pneumothorax. Bones demineralized. IMPRESSION: Mild pulmonary edema with persistent RIGHT pleural effusion and basilar atelectasis. Electronically Signed   By: Lavonia Dana M.D.   On: 07/25/2016 18:09        Scheduled Meds: . aspirin EC  81 mg Oral Daily  . donepezil  10 mg Oral QHS  . ezetimibe-simvastatin  1 tablet Oral QHS  . insulin aspart  0-9 Units Subcutaneous TID WC  . nystatin   Topical TID   Continuous Infusions:   LOS: 2 days       Izaha Shughart Gerome Apley, MD [facility] Pager [phone]  If 7PM-7AM, please contact night-coverage [email or web address] Password [email or web address] 07/26/2016, 8:04 AM

## 2016-07-26 NOTE — Consult Note (Signed)
[facility] Nurse wound consult note Reason for Consult:Left foot recent surgical (great toe amputation) site. Consulted for the provision of wound care orders. Wound type:Surgical, non-healing Pressure Injury POA: No Measurement: 6cm x 2cm x 3cm with wound bed obscured by the presence of stringy, tan necrotic non-viable tissue. Wound bed:As described above Drainage (amount, consistency, odor) Small amount of light yellow exudate on old dressing. Strong odor consistent with necrotic tissue noted. Periwound:Intact, small amount of maceration at wound periphery Dressing procedure/placement/frequency: Recommend that Dr. Sharol Given see this wound when he is assessing surgical site for recent fracture.  I have provided nursing staff with conservative care orders using saline moistened gauze to fill the surgical defect, but Dr. Sharol Given may wish to more aggressively debride the necrotic tissue either sharply, via hydrotherapy or with an enzymatic debriding agent (collagenase/Santyl). If you agree, please consult Dr. Sharol Given. In the interim, I have provided bilateral heel pressure redistribution boots (Prevalon) for his use in bed; patient to wear [place] shoe only when ambulating. [facility] nursing team will not follow, but will remain available to this patient, the nursing and medical teams.  Please re-consult if needed. Thanks, Maudie Flakes, MSN, RN, [place], Arther Abbott  Pager# [phone]

## 2016-07-27 ENCOUNTER — Inpatient Hospital Stay (HOSPITAL_COMMUNITY): Payer: Medicare Other

## 2016-07-27 DIAGNOSIS — J9601 Acute respiratory failure with hypoxia: Secondary | ICD-10-CM

## 2016-07-27 LAB — BASIC METABOLIC PANEL
ANION GAP: 6 (ref 5–15)
ANION GAP: 6 (ref 5–15)
BUN: 46 mg/dL — AB (ref 6–20)
BUN: 48 mg/dL — ABNORMAL HIGH (ref 6–20)
CALCIUM: 9.3 mg/dL (ref 8.9–10.3)
CALCIUM: 9.3 mg/dL (ref 8.9–10.3)
CO2: 29 mmol/L (ref 22–32)
CO2: 30 mmol/L (ref 22–32)
Chloride: 100 mmol/L — ABNORMAL LOW (ref 101–111)
Chloride: 99 mmol/L — ABNORMAL LOW (ref 101–111)
Creatinine, Ser: 1.21 mg/dL (ref 0.61–1.24)
Creatinine, Ser: 1.19 mg/dL (ref 0.61–1.24)
GFR calc Af Amer: >60 mL/min (ref >60)
GFR calc non Af Amer: 56 mL/min — ABNORMAL LOW (ref >60)
GFR, EST NON AFRICAN AMERICAN: 55 mL/min — AB (ref >60)
GLUCOSE: 135 mg/dL — AB (ref 65–99)
GLUCOSE: 139 mg/dL — AB (ref 65–99)
POTASSIUM: 5.5 mmol/L — AB (ref 3.5–5.1)
Potassium: 5.5 mmol/L — ABNORMAL HIGH (ref 3.5–5.1)
SODIUM: 135 mmol/L (ref 135–145)
Sodium: 135 mmol/L (ref 135–145)

## 2016-07-27 LAB — CBC WITH DIFFERENTIAL/PLATELET
BASOS ABS: 0 10*3/uL (ref 0.0–0.1)
Basophils Relative: 0 %
EOS ABS: 0.2 10*3/uL (ref 0.0–0.7)
EOS PCT: 2 %
HCT: 27.3 % — ABNORMAL LOW (ref 39.0–52.0)
Hemoglobin: 8.3 g/dL — ABNORMAL LOW (ref 13.0–17.0)
LYMPHS PCT: 13 %
Lymphs Abs: 1.4 10*3/uL (ref 0.7–4.0)
MCH: 27.2 pg (ref 26.0–34.0)
MCHC: 30.4 g/dL (ref 30.0–36.0)
MCV: 89.5 fL (ref 78.0–100.0)
MONO ABS: 1 10*3/uL (ref 0.1–1.0)
Monocytes Relative: 10 %
Neutro Abs: 7.8 10*3/uL — ABNORMAL HIGH (ref 1.7–7.7)
Neutrophils Relative %: 75 %
PLATELETS: 331 10*3/uL (ref 150–400)
RBC: 3.05 MIL/uL — AB (ref 4.22–5.81)
RDW: 15.8 % — AB (ref 11.5–15.5)
WBC: 10.4 10*3/uL (ref 4.0–10.5)

## 2016-07-27 LAB — GLUCOSE, CAPILLARY
GLUCOSE-CAPILLARY: 109 mg/dL — AB (ref 65–99)
Glucose-Capillary: 144 mg/dL — ABNORMAL HIGH (ref 65–99)
Glucose-Capillary: 145 mg/dL — ABNORMAL HIGH (ref 65–99)
Glucose-Capillary: 131 mg/dL — ABNORMAL HIGH (ref 65–99)

## 2016-07-27 LAB — PROTIME-INR
INR: 2.44
INR: 2.36
PROTHROMBIN TIME: 26.2 s — AB (ref 11.4–15.2)
Prothrombin Time: 26.9 seconds — ABNORMAL HIGH (ref 11.4–15.2)

## 2016-07-27 MED ORDER — FUROSEMIDE 10 MG/ML IJ SOLN
INTRAMUSCULAR | Status: AC
  Administered 2016-07-27: 40 mg via INTRAVENOUS
  Filled 2016-07-27: qty 4

## 2016-07-27 MED ORDER — FUROSEMIDE 10 MG/ML IJ SOLN
40.0000 mg | Freq: Once | INTRAMUSCULAR | Status: AC
  Administered 2016-07-27: 40 mg via INTRAVENOUS

## 2016-07-27 MED ORDER — VITAMIN K1 10 MG/ML IJ SOLN
5.0000 mg | Freq: Once | INTRAVENOUS | Status: AC
  Administered 2016-07-27: 5 mg via INTRAVENOUS
  Filled 2016-07-27: qty 0.5

## 2016-07-27 NOTE — Progress Notes (Addendum)
PROGRESS NOTE    [name].  [id number] DOB: 23-Aug-1934 DOA: 07/24/2016 PCP: [name], MD    Brief Narrative:  This is a 82 yo male with multiple medical problems, including atrial fibrillation on warfarin, dementia and recent foot osteomyelitis. Presents after a mechanica fall. Patient has been more lethargic and confused at the nursing home. On the initial examination, he was hemodynamically stable, decreased breath sounds at right base. Right shoulder was tender to palpation. He was found with a supra-therapeutic INR and acutely anemic. Right humeral fracture, admitted for further evaluation. Right pleural effusion with hypoxic respiratory failure.   Assessment & Plan:   Principal Problem:   Right humeral fracture Active Problems:   DM (diabetes mellitus) type II uncontrolled, periph vascular disorder ([name])   S/P AVR (aortic valve replacement)   Chronic atrial fibrillation ([name])   Chronic diastolic heart failure ([name])   Pleural effusion   Anemia   Supratherapeutic INR   Chronic respiratory failure ([place])   Fall   Alzheimer's dementia   Fracture of humerus neck, right, closed, initial encounter    1. Right humeral fracture. Continue immobilization.  Pain control with acetaminophen and morphine iV for sever pain. Plan for immobilization for 2 weeks with follow up as outpatient.   2. Coagulopathy due to vitamin K antagonist. Persistent elevation of INR, patient will need thoracentesis, will give 5 mg of vitamin K and follow on INR this pm.  INR still elevated, will need to target at least below 2, will repeat dose of vitamin k (5mg ) and will follow INR in am.   3. Chronic atrial fibrillation. Rate controlled, as needed metoprolol IV, telemetry with paced rhythm.   4. Right sided pleural effusion with acute hypoxic respiratory failure. Patient continue to show respiratory distress with movement and desaturation, needs thoracentesis, will continue aggressive vitamin K  and will order thoracentesis. Suspected transudate, will give one dose of furosemide IV 40 mg and will follow on urine output. Blood pressure more stable. Keep negative fluid balance.   5. Acute anemia. Hb stable at 8.3. No need for further blood transfusion. SP blood transfusion on admission 1 unit prbc.    6. CKD stage 3. Renal function continue to be stable cr at 1.19 from 1.21, will order furosemide due to hypoxemic respiratory failure, follow on urine output and electrolytes. Avoid hypotension or nephrotoxic medications.  7. T2DM. Continue glucose cover and monitoring, with iss, patient with very poor oral intake. Capillary glucose  [id number]. Patient tolerating po well.  8. Dementia. Neuro checks per unit protocol. Continue donepezil. No agitation. Patient reports pain to be controlled.   9. Hypotension.  Blood pressure systolic up to 99991111 to [id number]. Will be cautions with IV fluids due to pulmonary edema, will follow response to furosemide.   DVT prophylaxis: coagulopathic  Code Status: full  Family Communication: No family at the bedside  Disposition Plan: snf.   Consultants:   Orthopedics.   Procedures:  Antimicrobials  Subjective: This am patient went into respiratory distress, required increased fi02, no chest pain, no nausea or vomiting, reports his shoulder pain to have improved.   Objective: Vitals:   07/26/16 2039 07/26/16 2312 07/27/16 0318 07/27/16 0700  BP: (!) 112/51 (!) 125/49 (!) 112/53 (!) 120/54  Pulse: 61 61 61 61  Resp: (!) 32 (!) 22 (!) 23 20  Temp: 97.7 F (36.5 C) 97.7 F (36.5 C) 97.7 F (36.5 C) 97.7 F (36.5 C)  TempSrc: Oral Oral Oral  SpO2: 97% 98% 97% (!) 79%  Weight:      Height:        Intake/Output Summary (Last 24 hours) at 07/27/16 0830 Last data filed at 07/27/16 0400  Gross per 24 hour  Intake              300 ml  Output              700 ml  Net             -400 ml   Filed Weights   07/24/16 2133 07/24/16 2234  07/25/16 0530  Weight: 88.5 kg (195 lb) 88.5 kg (195 lb) 89.2 kg (196 lb 9.6 oz)    Examination:  General exam: deconditioned, not in pain, mild dyspnea E ENT: pallor but no icterus, oral mucosa moist.  Respiratory system: Decreased breath sounds at the right base, with no wheezing, rales or rhonchi. No accesory muscle use. Cardiovascular system: S1 & S2 heard, RRR. No JVD, murmurs, rubs, gallops or clicks. No pedal edema. Gastrointestinal system: Abdomen is nondistended, soft and nontender. No organomegaly or masses felt. Normal bowel sounds heard. Central nervous system: Alert and oriented. No focal neurological deficits. Extremities: Symmetric 5 x 5 power.Left foot sp toes amputation.  Skin: No rashes, lesions or ulcers   Data Reviewed: I have personally reviewed following labs and imaging studies  CBC:  Recent Labs Lab 07/24/16 2149 07/25/16 0550 07/25/16 1343 07/25/16 2046 07/26/16 0414 07/27/16 0150  WBC 12.6* 10.8*  --  9.9 10.6* 10.4  NEUTROABS 10.5*  --   --  6.9 8.1* 7.8*  HGB 7.3* 8.4* 8.2* 8.3* 8.9* 8.3*  HCT 23.1* 27.4* 27.0* 26.9* 29.1* 27.3*  MCV 89.5 89.0  --  88.5 89.0 89.5  PLT 240 290  --  301 326 [id number]   Basic Metabolic Panel:  Recent Labs Lab 07/25/16 0550 07/25/16 2046 07/26/16 0414 07/27/16 0150 07/27/16 0600  NA 136 138 137 135 135  K 4.1 4.6 4.9 5.5* 5.5*  CL 100* 98* 100* 100* 99*  CO2 31 31 31 29 30   GLUCOSE 141* 83 119* 135* 139*  BUN 40* 45* 43* 46* 48*  CREATININE 1.19 1.36* 1.36* 1.21 1.19  CALCIUM 9.2 9.2 9.3 9.3 9.3   GFR: Estimated Creatinine Clearance: 56.6 mL/min (by C-G formula based on SCr of 1.19 mg/dL). Liver Function Tests:  Recent Labs Lab 07/24/16 2149  AST 50*  ALT 17  ALKPHOS 134*  BILITOT 0.4  PROT 6.3*  ALBUMIN 2.1*   No results for input(s): LIPASE, AMYLASE in the last 168 hours. No results for input(s): AMMONIA in the last 168 hours. Coagulation Profile:  Recent Labs Lab 07/24/16 2149  07/25/16 0550 07/26/16 0414 07/26/16 1244 07/27/16 0600  INR 3.60 3.31 2.62 2.60 2.44   Cardiac Enzymes: No results for input(s): CKTOTAL, CKMB, CKMBINDEX, TROPONINI in the last 168 hours. BNP (last 3 results) No results for input(s): PROBNP in the last 8760 hours. HbA1C: No results for input(s): HGBA1C in the last 72 hours. CBG:  Recent Labs Lab 07/26/16 0746 07/26/16 1222 07/26/16 1716 07/26/16 2133 07/27/16 0823  GLUCAP 109* 99 130* 172* 144*   Lipid Profile: No results for input(s): CHOL, HDL, LDLCALC, TRIG, CHOLHDL, LDLDIRECT in the last 72 hours. Thyroid Function Tests: No results for input(s): TSH, T4TOTAL, FREET4, T3FREE, THYROIDAB in the last 72 hours. Anemia Panel: No results for input(s): VITAMINB12, FOLATE, FERRITIN, TIBC, IRON, RETICCTPCT in the last 72 hours. Sepsis Labs:  Recent Labs Lab  07/25/16 2046  LATICACIDVEN 0.6    Recent Results (from the past 240 hour(s))  MRSA PCR Screening     Status: None   Collection Time: 07/25/16  5:28 AM  Result Value Ref Range Status   MRSA by PCR NEGATIVE NEGATIVE Final    Comment:        The GeneXpert MRSA Assay (FDA approved for NASAL specimens only), is one component of a comprehensive MRSA colonization surveillance program. It is not intended to diagnose MRSA infection nor to guide or monitor treatment for MRSA infections.          Radiology Studies: Dg Chest Port 1 View  Result Date: 07/27/2016 CLINICAL DATA:  Hypoxia. EXAM: PORTABLE CHEST 1 VIEW COMPARISON:  July 25, 2016 FINDINGS: The layering right pleural effusion is similar to mildly larger in the interval. There is probably a tiny left effusion. Stable cardiomediastinal silhouette. Increasing bilateral pulmonary opacities, right greater than left, likely mildly worsened edema. No other change. IMPRESSION: Moderate to large a layering effusion on the right, similar to mildly worsened in the interval. Increasing pulmonary edema. Tiny left  effusion. Electronically Signed   By: Dorise Bullion III M.D   On: 07/27/2016 07:45   Dg Chest Port 1 View  Result Date: 07/25/2016 CLINICAL DATA:  Respiratory difficulty EXAM: PORTABLE CHEST 1 VIEW COMPARISON:  Earlier today FINDINGS: Moderate to large layering right pleural effusion. There is chronic cardiomegaly. The patient is status post CABG and aortic valve replacement. Single chamber pacer from the left in stable position. Pulmonary venous congestion. IMPRESSION: Stable compared to earlier today. Cardiomegaly and venous congestion with moderate to large layering pleural effusion on the right. Electronically Signed   By: Monte Fantasia M.D.   On: 07/25/2016 20:24   Dg Chest Port 1 View  Result Date: 07/25/2016 CLINICAL DATA:  Dyspnea, desaturation when lying flat EXAM: PORTABLE CHEST 1 VIEW COMPARISON:  Portable exam 1745 hours compared to 07/24/2016 FINDINGS: LEFT subclavian pacemaker lead projects over RIGHT ventricle. Enlargement of cardiac silhouette post CABG and AVR. Pulmonary vascular congestion. RIGHT pleural effusion and basilar atelectasis. Perihilar edema. No pneumothorax. Bones demineralized. IMPRESSION: Mild pulmonary edema with persistent RIGHT pleural effusion and basilar atelectasis. Electronically Signed   By: Lavonia Dana M.D.   On: 07/25/2016 18:09        Scheduled Meds: . aspirin EC  81 mg Oral Daily  . donepezil  10 mg Oral QHS  . ezetimibe-simvastatin  1 tablet Oral QHS  . insulin aspart  0-9 Units Subcutaneous TID WC  . nystatin   Topical TID  . phytonadione (VITAMIN K) IV  5 mg Intravenous Once   Continuous Infusions:   LOS: 3 days     Larisa Lanius Gerome Apley, MD [facility] Pager [phone]  If 7PM-7AM, please contact night-coverage [email or web address] Password [email or web address] 07/27/2016, 8:30 AM

## 2016-07-27 NOTE — Progress Notes (Signed)
Pt w/increased work of breathing, RR 30s, requires 45% venti mask to keep sats greater than 92%.  Pt having difficulty keeping mask on his face d/t confusion and/or discomfort.  Frequent assistance required of staff to help patient maintain sats.  RN requested port CXR from MD to re-evaluate right pleural effusion.  Right lower lung base sounds absent at this time; right upper lung sounds very diminished.  Left lung sounds also diminished throughout.

## 2016-07-28 ENCOUNTER — Inpatient Hospital Stay (HOSPITAL_COMMUNITY): Payer: Medicare Other

## 2016-07-28 LAB — TYPE AND SCREEN
Blood Product Expiration Date: 201802062359
Blood Product Expiration Date: 201802142359
ISSUE DATE / TIME: 201801271450
ISSUE DATE / TIME: 201802020043
Unit Type and Rh: 1700
Unit Type and Rh: 7300

## 2016-07-28 LAB — PROTEIN, BODY FLUID: Total protein, fluid: <3 g/dL

## 2016-07-28 LAB — BODY FLUID CELL COUNT WITH DIFFERENTIAL
Eos, Fluid: 0 %
LYMPHS FL: 40 %
Monocyte-Macrophage-Serous Fluid: 51 % (ref 50–90)
NEUTROPHIL FLUID: 9 % (ref 0–25)
Other Cells, Fluid: 0 %
WBC FLUID: 107 uL (ref 0–1000)

## 2016-07-28 LAB — BASIC METABOLIC PANEL
Anion gap: 6 (ref 5–15)
BUN: 54 mg/dL — AB (ref 6–20)
CALCIUM: 9.5 mg/dL (ref 8.9–10.3)
CHLORIDE: 98 mmol/L — AB (ref 101–111)
CO2: 32 mmol/L (ref 22–32)
CREATININE: 1.11 mg/dL (ref 0.61–1.24)
Glucose, Bld: 164 mg/dL — ABNORMAL HIGH (ref 65–99)
Potassium: 5.4 mmol/L — ABNORMAL HIGH (ref 3.5–5.1)
SODIUM: 136 mmol/L (ref 135–145)

## 2016-07-28 LAB — CBC WITH DIFFERENTIAL/PLATELET
BASOS PCT: 0 %
Basophils Absolute: 0 10*3/uL (ref 0.0–0.1)
EOS ABS: 0.1 10*3/uL (ref 0.0–0.7)
EOS PCT: 1 %
HCT: 26.2 % — ABNORMAL LOW (ref 39.0–52.0)
HEMOGLOBIN: 8 g/dL — AB (ref 13.0–17.0)
LYMPHS ABS: 1.4 10*3/uL (ref 0.7–4.0)
Lymphocytes Relative: 12 %
MCH: 27.6 pg (ref 26.0–34.0)
MCHC: 30.5 g/dL (ref 30.0–36.0)
MCV: 90.3 fL (ref 78.0–100.0)
MONOS PCT: 7 %
Monocytes Absolute: 0.8 10*3/uL (ref 0.1–1.0)
NEUTROS PCT: 80 %
Neutro Abs: 9.4 10*3/uL — ABNORMAL HIGH (ref 1.7–7.7)
PLATELETS: 308 10*3/uL (ref 150–400)
RBC: 2.9 MIL/uL — ABNORMAL LOW (ref 4.22–5.81)
RDW: 16.2 % — AB (ref 11.5–15.5)
WBC: 11.8 10*3/uL — ABNORMAL HIGH (ref 4.0–10.5)

## 2016-07-28 LAB — PROTIME-INR
INR: 1.54
PROTHROMBIN TIME: 18.6 s — AB (ref 11.4–15.2)

## 2016-07-28 LAB — GLUCOSE, SEROUS FLUID: Glucose, Fluid: 149 mg/dL

## 2016-07-28 LAB — GRAM STAIN

## 2016-07-28 LAB — GLUCOSE, CAPILLARY
GLUCOSE-CAPILLARY: 117 mg/dL — AB (ref 65–99)
GLUCOSE-CAPILLARY: 182 mg/dL — AB (ref 65–99)
Glucose-Capillary: 142 mg/dL — ABNORMAL HIGH (ref 65–99)
Glucose-Capillary: 125 mg/dL — ABNORMAL HIGH (ref 65–99)

## 2016-07-28 LAB — LACTATE DEHYDROGENASE, PLEURAL OR PERITONEAL FLUID: LD, Fluid: 44 U/L — ABNORMAL HIGH (ref 3–23)

## 2016-07-28 MED ORDER — FUROSEMIDE 10 MG/ML IJ SOLN
40.0000 mg | Freq: Once | INTRAMUSCULAR | Status: AC
  Administered 2016-07-28: 40 mg via INTRAVENOUS
  Filled 2016-07-28: qty 4

## 2016-07-28 MED ORDER — LIDOCAINE HCL (PF) 1 % IJ SOLN
INTRAMUSCULAR | Status: AC
  Filled 2016-07-28: qty 10

## 2016-07-28 NOTE — Care Management Note (Signed)
Case Management Note  Patient Details  Name: [name]. MRN: [name] Date of Birth: 09-10-1934  Subjective/Objective:  Adm w rt humeral fx                  Action/Plan: from nsg facility, sw ref has been placed.   Expected Discharge Date:                  Expected Discharge Plan:  Skilled Nursing Facility  In-House Referral:  Clinical Social Work  Discharge planning Services     Post Acute Care Choice:    Choice offered to:     DME Arranged:    DME Agency:     HH Arranged:    [place] Agency:     Status of Service:  In process, will continue to follow  If discussed at Long Length of Stay Meetings, dates discussed:    Additional Comments:nse states adult prot serv came by and had been following pt before last adm. They referred aps to soc work.  Lacretia Leigh, RN 07/28/2016, 1:39 PM

## 2016-07-28 NOTE — Progress Notes (Signed)
PROGRESS NOTE    [name].  [id number] DOB: February 19, 1935 DOA: 07/24/2016 PCP: [name], MD    Brief Narrative:  This is a 82 yo male with multiple medical problems, including atrial fibrillation on warfarin, dementia and recent foot osteomyelitis. Presents after a mechanica fall. Patient has been more lethargic and confused at the nursing home. On the initial examination, he was hemodynamically stable, decreased breath sounds at right base. Right shoulder was tender to palpation. He was found with a supra-therapeutic INR and acutely anemic. Right humeral fracture, admitted for further evaluation. Right pleural effusion with hypoxic respiratory failure.   Assessment & Plan:   Principal Problem:   Right humeral fracture Active Problems:   DM (diabetes mellitus) type II uncontrolled, periph vascular disorder ([name])   S/P AVR (aortic valve replacement)   Chronic atrial fibrillation ([name])   Chronic diastolic heart failure ([name])   Pleural effusion   Anemia   Supratherapeutic INR   Chronic respiratory failure ([place])   Fall   Alzheimer's dementia   Fracture of humerus neck, right, closed, initial encounter    1. Right humeral fracture. Continue immobilization andpain control, stable with acetaminophen and morphine IV.  2. Coagulopathy due to vitamin K antagonist. INR down to 1.54 after total of 15 mg vitamin K, will proceed to order thoracentesis.   3. Chronic atrial fibrillation. Rate controlled, as needed metoprolol IV, telemetry with paced rhythm. Heart rate in to 60's.   4. Right sided pleural effusion with acute hypoxic respiratory failure. Will repeat dose of furosemide 40 mg, urine output over last 24 hours 1575 cc. Will target negative fluid balance as tolerated per blood pressure. Will send fluid for ldh, protein, cell count, culture and pH. Will check serum LDH, suspected transudate due to combination of decrease oncotic pressure and volume overload. Continue  oxymetry monitoring and supplemental 02 per HF to target 02 sat above 92%.  5. Acute anemia. Hb stable at 8.0. SP blood transfusion on admission 1 unit prbc. No signs of bleeding.    6. CKD stage 3. Renal function continue to be stable cr at 1.11 from 1.19. Will continue diuresis as tolerated per blood pressure. K at 5,4 and serum bicarb at 32.  7. T2DM. Continue glucose cover and monitoring, with insulin sliding scale. Capillary glucose 131-142-125. Patient tolerating po well.  8. Dementia. No agitation. Continue donepezil. No agitation. Patient reports pain to be controlled.   9. Hypotension.  Systlic blood pressure 123XX123. Will continue to follow a restrictive IV fluids strategy.   DVT prophylaxis:coagulopathic  Code Status:full  Family Communication:No family at the bedside  Disposition Plan:snf.   Consultants:  Orthopedics.   Procedures:  Antimicrobials   Subjective: Patient denies any chest pain or dyspnea. Noted to be confused.   Objective: Vitals:   07/27/16 2038 07/28/16 0027 07/28/16 0438 07/28/16 0757  BP: 120/69 (!) 126/40 (!) 114/56 (!) 115/54  Pulse:  60  (!) 59  Resp: (!) 24 (!) 26  (!) 21  Temp: 97.9 F (36.6 C) 97.6 F (36.4 C)  97.4 F (36.3 C)  TempSrc: Oral Oral  Oral  SpO2: 95% 99%  (!) 83%  Weight:      Height:        Intake/Output Summary (Last 24 hours) at 07/28/16 0802 Last data filed at 07/28/16 0500  Gross per 24 hour  Intake              240 ml  Output  1325 ml  Net            -1085 ml   Filed Weights   07/24/16 2133 07/24/16 2234 07/25/16 0530  Weight: 88.5 kg (195 lb) 88.5 kg (195 lb) 89.2 kg (196 lb 9.6 oz)    Examination:  General exam: deconditioned E ENT: mild pallor no icterus, oral mucosa moist.  Respiratory system: Decreased breath sounds at the right base. No wheezing or rhonchi.  Cardiovascular system: S1 & S2 heard, RRR. No JVD, murmurs, rubs, gallops or clicks. No pedal  edema. Gastrointestinal system: Abdomen is nondistended, soft and nontender. No organomegaly or masses felt. Normal bowel sounds heard. Central nervous system: Alert and oriented. No focal neurological deficits. Extremities: Symmetric 5 x 5 power. Left foot with dressing in place.  Skin: No rashes, lesions or ulcers     Data Reviewed: I have personally reviewed following labs and imaging studies  CBC:  Recent Labs Lab 07/24/16 2149 07/25/16 0550 07/25/16 1343 07/25/16 2046 07/26/16 0414 07/27/16 0150 07/28/16 0302  WBC 12.6* 10.8*  --  9.9 10.6* 10.4 11.8*  NEUTROABS 10.5*  --   --  6.9 8.1* 7.8* 9.4*  HGB 7.3* 8.4* 8.2* 8.3* 8.9* 8.3* 8.0*  HCT 23.1* 27.4* 27.0* 26.9* 29.1* 27.3* 26.2*  MCV 89.5 89.0  --  88.5 89.0 89.5 90.3  PLT 240 290  --  301 326 331 [id number]   Basic Metabolic Panel:  Recent Labs Lab 07/25/16 2046 07/26/16 0414 07/27/16 0150 07/27/16 0600 07/28/16 0302  NA 138 137 135 135 136  K 4.6 4.9 5.5* 5.5* 5.4*  CL 98* 100* 100* 99* 98*  CO2 31 31 29 30  32  GLUCOSE 83 119* 135* 139* 164*  BUN 45* 43* 46* 48* 54*  CREATININE 1.36* 1.36* 1.21 1.19 1.11  CALCIUM 9.2 9.3 9.3 9.3 9.5   GFR: Estimated Creatinine Clearance: 60.7 mL/min (by C-G formula based on SCr of 1.11 mg/dL). Liver Function Tests:  Recent Labs Lab 07/24/16 2149  AST 50*  ALT 17  ALKPHOS 134*  BILITOT 0.4  PROT 6.3*  ALBUMIN 2.1*   No results for input(s): LIPASE, AMYLASE in the last 168 hours. No results for input(s): AMMONIA in the last 168 hours. Coagulation Profile:  Recent Labs Lab 07/26/16 0414 07/26/16 1244 07/27/16 0600 07/27/16 1140 07/28/16 0302  INR 2.62 2.60 2.44 2.36 1.54   Cardiac Enzymes: No results for input(s): CKTOTAL, CKMB, CKMBINDEX, TROPONINI in the last 168 hours. BNP (last 3 results) No results for input(s): PROBNP in the last 8760 hours. HbA1C: No results for input(s): HGBA1C in the last 72 hours. CBG:  Recent Labs Lab 07/27/16 0823  07/27/16 1202 07/27/16 1557 07/27/16 2154 07/28/16 0752  GLUCAP 144* 109* 145* 131* 142*   Lipid Profile: No results for input(s): CHOL, HDL, LDLCALC, TRIG, CHOLHDL, LDLDIRECT in the last 72 hours. Thyroid Function Tests: No results for input(s): TSH, T4TOTAL, FREET4, T3FREE, THYROIDAB in the last 72 hours. Anemia Panel: No results for input(s): VITAMINB12, FOLATE, FERRITIN, TIBC, IRON, RETICCTPCT in the last 72 hours. Sepsis Labs:  Recent Labs Lab 07/25/16 2046  LATICACIDVEN 0.6    Recent Results (from the past 240 hour(s))  MRSA PCR Screening     Status: None   Collection Time: 07/25/16  5:28 AM  Result Value Ref Range Status   MRSA by PCR NEGATIVE NEGATIVE Final    Comment:        The GeneXpert MRSA Assay (FDA approved for NASAL specimens only), is one component  of a comprehensive MRSA colonization surveillance program. It is not intended to diagnose MRSA infection nor to guide or monitor treatment for MRSA infections.          Radiology Studies: Dg Chest Port 1 View  Result Date: 07/27/2016 CLINICAL DATA:  Hypoxia. EXAM: PORTABLE CHEST 1 VIEW COMPARISON:  July 25, 2016 FINDINGS: The layering right pleural effusion is similar to mildly larger in the interval. There is probably a tiny left effusion. Stable cardiomediastinal silhouette. Increasing bilateral pulmonary opacities, right greater than left, likely mildly worsened edema. No other change. IMPRESSION: Moderate to large a layering effusion on the right, similar to mildly worsened in the interval. Increasing pulmonary edema. Tiny left effusion. Electronically Signed   By: Dorise Bullion III M.D   On: 07/27/2016 07:45        Scheduled Meds: . aspirin EC  81 mg Oral Daily  . donepezil  10 mg Oral QHS  . ezetimibe-simvastatin  1 tablet Oral QHS  . insulin aspart  0-9 Units Subcutaneous TID WC  . nystatin   Topical TID   Continuous Infusions:   LOS: 4 days       Gavynn Duvall Gerome Apley,  MD [facility] Pager [phone]  If 7PM-7AM, please contact night-coverage [email or web address] Password [email or web address] 07/28/2016, 8:02 AM

## 2016-07-28 NOTE — Procedures (Signed)
PROCEDURE SUMMARY:  Successful US guided diagnostic and therapeutic right thoracentesis. Yielded 2.1L of clear, yellow fluid. Pt tolerated procedure well. No immediate complications.  Specimen was sent for labs. CXR ordered.  Docia Barrier PA-C 07/28/2016 2:57 PM

## 2016-07-29 LAB — GLUCOSE, CAPILLARY
GLUCOSE-CAPILLARY: 144 mg/dL — AB (ref 65–99)
GLUCOSE-CAPILLARY: 171 mg/dL — AB (ref 65–99)
GLUCOSE-CAPILLARY: 117 mg/dL — AB (ref 65–99)
GLUCOSE-CAPILLARY: 116 mg/dL — AB (ref 65–99)

## 2016-07-29 LAB — BASIC METABOLIC PANEL
ANION GAP: 8 (ref 5–15)
BUN: 48 mg/dL — ABNORMAL HIGH (ref 6–20)
CHLORIDE: 97 mmol/L — AB (ref 101–111)
CO2: 34 mmol/L — AB (ref 22–32)
Calcium: 9.7 mg/dL (ref 8.9–10.3)
Creatinine, Ser: 1.08 mg/dL (ref 0.61–1.24)
GFR calc non Af Amer: >60 mL/min (ref >60)
Glucose, Bld: 158 mg/dL — ABNORMAL HIGH (ref 65–99)
Potassium: 4.9 mmol/L (ref 3.5–5.1)
Sodium: 139 mmol/L (ref 135–145)

## 2016-07-29 LAB — PH, BODY FLUID: PH, BODY FLUID: 8.2

## 2016-07-29 LAB — LACTATE DEHYDROGENASE: LDH: 157 U/L (ref 98–192)

## 2016-07-29 LAB — PATHOLOGIST SMEAR REVIEW

## 2016-07-29 MED ORDER — FUROSEMIDE 40 MG PO TABS
40.0000 mg | ORAL_TABLET | Freq: Every day | ORAL | Status: DC
  Administered 2016-07-29 – 2016-08-09 (×10): 40 mg via ORAL
  Filled 2016-07-29 (×11): qty 1

## 2016-07-29 NOTE — Consult Note (Signed)
Patient name: [name]. MRN: [name] DOB: 1934-12-21 Sex: male  REASON FOR CONSULT: Necrotic tissue at left great toe amputation site.   HPI: [name]. is a 82 y.o. male who was a patient of Dr. Aleen Campi. The patient was seen in our office by the nurse practitioner on 06/26/2016 with osteomyelitis of the first metatarsal left foot.. The patient was evaluated also by Dr. Oneida Alar at that time. Most recently, on 07/16/2016 the patient underwent balloon angioplasty of the left anterior tibial artery with drug coated balloon angioplasty of the left superficial femoral artery and also left great toe amputation by Dr. Donzetta Matters. The patient was admitted on 07/24/2016 after a fall at the SNF. We were asked to look at the wound on the left foot as there was some necrotic tissue present here.   Current Facility-Administered Medications  Medication Dose Route Frequency Provider Last Rate Last Dose  . acetaminophen (TYLENOL) tablet 650 mg  650 mg Oral Q6H PRN Norval Morton, MD   650 mg at 07/25/16 1652   Or  . acetaminophen (TYLENOL) suppository 650 mg  650 mg Rectal Q6H PRN Norval Morton, MD      . albuterol (PROVENTIL) (2.5 MG/3ML) 0.083% nebulizer solution 2.5 mg  2.5 mg Nebulization Q4H PRN Norval Morton, MD      . aspirin EC tablet 81 mg  81 mg Oral Daily Norval Morton, MD   81 mg at 07/29/16 0909  . donepezil (ARICEPT) tablet 10 mg  10 mg Oral QHS Norval Morton, MD   10 mg at 07/28/16 2154  . ezetimibe-simvastatin (VYTORIN) 10-40 MG per tablet 1 tablet  1 tablet Oral QHS Norval Morton, MD   1 tablet at 07/28/16 2154  . furosemide (LASIX) tablet 40 mg  40 mg Oral Daily Mauricio Gerome Apley, MD   40 mg at 07/29/16 0909  . insulin aspart (novoLOG) injection 0-9 Units  0-9 Units Subcutaneous TID WC Norval Morton, MD   2 Units at 07/29/16 1203  . ipratropium (ATROVENT) nebulizer solution 0.5 mg  0.5 mg Nebulization Q4H PRN Norval Morton, MD      . nystatin  (MYCOSTATIN/NYSTOP) topical powder   Topical TID Norval Morton, MD      . ondansetron (ZOFRAN) tablet 4 mg  4 mg Oral Q6H PRN Norval Morton, MD       Or  . ondansetron (ZOFRAN) injection 4 mg  4 mg Intravenous Q6H PRN Norval Morton, MD   4 mg at 07/25/16 1819    REVIEW OF SYSTEMS:  [X]  denotes positive finding, [ ]  denotes negative finding Cardiac  Comments:  Chest pain or chest pressure:    Shortness of breath upon exertion:    Short of breath when lying flat:    Irregular heart rhythm:    Constitutional    Fever or chills:      PHYSICAL EXAM: Vitals:   07/29/16 1100 07/29/16 1125 07/29/16 1200 07/29/16 1312  BP: 113/82 113/82 (!) 120/54 (!) 101/47  Pulse: 62 (!) 59 (!) 59 60  Resp: (!) 28 (!) 22 18   Temp:  97.5 F (36.4 C)  97.6 F (36.4 C)  TempSrc:  Oral  Axillary  SpO2: 100% 100% 98% 99%  Weight:      Height:        GENERAL: The patient is a well-nourished male, in no acute distress. The vital signs are documented above. CARDIOVASCULAR: There is a  regular rate and rhythm. PULMONARY: There is good air exchange bilaterally without wheezing or rales. There was necrotic tissue at the site of the left great toe amputation. I excised some devitalized adipose tissue and fascia. The wound was redressed.  There is a brisk anterior tibial signal and dorsalis pedis signal on the left foot with the Doppler.  MEDICAL ISSUES:  STATUS POST ANGIOPLASTY AND STENTING LEFT LOWER EXTREMITY AND LEFT GREAT TOE AMPUTATION:  Would continue with aggressive wound care to the left great toe. Given that there is exposed bone, he will need further work on the left foot and possible placement of a negative pressure dressing. This could potentially be done on Thursday.  Scot Dock, [facility] [phone]

## 2016-07-29 NOTE — Progress Notes (Signed)
Report given to [facility] on 3W. Patient being transferred to room 39.

## 2016-07-29 NOTE — NC FL2 (Signed)
[place] LEVEL OF CARE SCREENING TOOL     IDENTIFICATION  Patient Name: [name]. Birthdate: 1935/03/01 Sex: male Admission Date (Current Location): 07/24/2016  [facility] and [place] Number:  Herbalist and Address:  [facility] [place], [place] [address] [place], [place] [address]      Provider Number: [id number]  Attending Physician Name and Address:  Tawni Millers, *  Relative Name and Phone Number:       Current Level of Care: Hospital Recommended Level of Care: [place] Prior Approval Number:    Date Approved/Denied:   PASRR Number: [id number]  Discharge Plan: SNF    Current Diagnoses: Patient Active Problem List   Diagnosis Date Noted  . Right humeral fracture 07/25/2016  . Supratherapeutic INR 07/25/2016  . Chronic respiratory failure ([place]) 07/25/2016  . Fall 07/25/2016  . Alzheimer's dementia 07/25/2016  . Fracture of humerus neck, right, closed, initial encounter   . Anemia 07/24/2016  . Wound abscess   . Congestive heart failure ([place])   . S/P thoracentesis   . Malnutrition of moderate degree 07/04/2016  . Pleural effusion 07/04/2016  . Diabetic foot ulcer with osteomyelitis ([place]) 07/03/2016  . Hypoxia 07/03/2016  . Acute diastolic CHF (congestive heart failure) ([place]) 07/03/2016  . Abscess of abdominal wall 06/02/2014  . Abdominal wall abscess 06/02/2014  . Hyponatremia 06/02/2014  . Cellulitis 12/23/2013  . Aftercare following surgery of the circulatory system, [place] 10/05/2013  . Encounter for therapeutic drug monitoring 07/18/2013  . Tingling 11/01/2012  . Hypoxemia 08/04/2012  . Complete heart block ([place]) 07/26/2012  . Fatigue 01/20/2012  . Memory loss 01/20/2012  . Chronic diastolic heart failure ([place]) 09/01/2011  . Chronic atrial fibrillation ([place]) 08/21/2011  . Aortic valve stenosis, severe prosthetic 02/06/2011  . Paresthesia 12/16/2010  . Medicare annual wellness  visit, initial 10/10/2010  . Carotid artery disease ([place]) 09/27/2010  . Long term (current) use of anticoagulants 09/26/2010  . THYROID NODULE 01/26/2010  . PVD (peripheral vascular disease) ([place]) 01/02/2009  . HIP PAIN, BILATERAL 01/02/2009  . HLD (hyperlipidemia) 08/16/2008  . MYOCARDIAL INFARCTION, HX OF 08/16/2008  . S/P AVR (aortic valve replacement) 08/16/2008  . PERIPHERAL VASCULAR DISEASE WITH CLAUDICATION 08/16/2008  . ANEMIA, HX OF 08/16/2008  . AORTIC VALVE REPLACEMENT, HX OF 08/16/2008  . PACEMAKER, PERMANENT 08/16/2008  . ERECTILE DYSFUNCTION 11/26/2006  . DM (diabetes mellitus) type II uncontrolled, periph vascular disorder ([place]) 07/28/2006  . OBESITY, MORBID 07/28/2006  . Obstructive sleep apnea 07/28/2006  . Essential hypertension 07/28/2006  . CAD (coronary artery disease) 07/28/2006  . RENAL ARTERY STENOSIS 07/28/2006  . INSUFFICIENCY, VASCULAR NOS, INTESTINE 07/28/2006  . NEPHROLITHIASIS, HX OF 07/28/2006    Orientation RESPIRATION BLADDER Height & Weight     Self, Place   (Nasal cannula 6L) Continent Weight: 89.2 kg (196 lb 9.6 oz) Height:  5\' 10"  (177.8 cm)  BEHAVIORAL SYMPTOMS/MOOD NEUROLOGICAL BOWEL NUTRITION STATUS      Continent Diet (Please see DC Summary)  AMBULATORY STATUS COMMUNICATION OF NEEDS Skin   Limited Assist Verbally Surgical wounds (Closed incision on foot)                       Personal Care Assistance Level of Assistance  Bathing, Feeding, Dressing Bathing Assistance: Maximum assistance Feeding assistance: Maximum assistance Dressing Assistance: Maximum assistance     Functional Limitations Info  Sight, Hearing, Speech Sight Info: Adequate Hearing Info: Adequate Speech Info: Adequate  SPECIAL CARE FACTORS FREQUENCY                       Contractures Contractures Info: Not present    Additional Factors Info  Code Status, Allergies Code Status Info: Full Allergies Info: Potassium-containing Compounds,  Metoprolol Succinate           Current Medications (07/29/2016):  This is the current hospital active medication list Current Facility-Administered Medications  Medication Dose Route Frequency Provider Last Rate Last Dose  . acetaminophen (TYLENOL) tablet 650 mg  650 mg Oral Q6H PRN Norval Morton, MD   650 mg at 07/25/16 1652   Or  . acetaminophen (TYLENOL) suppository 650 mg  650 mg Rectal Q6H PRN Norval Morton, MD      . albuterol (PROVENTIL) (2.5 MG/3ML) 0.083% nebulizer solution 2.5 mg  2.5 mg Nebulization Q4H PRN Norval Morton, MD      . aspirin EC tablet 81 mg  81 mg Oral Daily Norval Morton, MD   81 mg at 07/29/16 0909  . donepezil (ARICEPT) tablet 10 mg  10 mg Oral QHS Norval Morton, MD   10 mg at 07/28/16 2154  . ezetimibe-simvastatin (VYTORIN) 10-40 MG per tablet 1 tablet  1 tablet Oral QHS Norval Morton, MD   1 tablet at 07/28/16 2154  . furosemide (LASIX) tablet 40 mg  40 mg Oral Daily Mauricio Gerome Apley, MD   40 mg at 07/29/16 0909  . insulin aspart (novoLOG) injection 0-9 Units  0-9 Units Subcutaneous TID WC Norval Morton, MD   1 Units at 07/29/16 0831  . ipratropium (ATROVENT) nebulizer solution 0.5 mg  0.5 mg Nebulization Q4H PRN Norval Morton, MD      . nystatin (MYCOSTATIN/NYSTOP) topical powder   Topical TID Norval Morton, MD      . ondansetron (ZOFRAN) tablet 4 mg  4 mg Oral Q6H PRN Norval Morton, MD       Or  . ondansetron (ZOFRAN) injection 4 mg  4 mg Intravenous Q6H PRN Norval Morton, MD   4 mg at 07/25/16 1819     Discharge Medications: Please see discharge summary for a list of discharge medications.  Relevant Imaging Results:  Relevant Lab Results:   Additional Information SS#: [id number]. Just an FYI.Marland KitchenMarland KitchenAPS is involved due to concerns over wife. I haven't heard many details but they are investigating.  Benard Halsted, LCSWA

## 2016-07-29 NOTE — Progress Notes (Signed)
Pt had 4 beat run of VTACH, asymptomatic. Baltazar Najjar NP notified. NP was also notified of patient not having a BM in 3 days. No new orders received

## 2016-07-29 NOTE — Progress Notes (Signed)
PROGRESS NOTE    [name].  [id number] DOB: 03/30/1935 DOA: 07/24/2016 PCP: [name], MD   Brief Narrative:  This is a 82 yo male with multiple medical problems, including atrial fibrillation on warfarin, dementia and recent foot osteomyelitis. Presents after a mechanica fall. Patient has been more lethargic and confused at the nursing home. On the initial examination, he was hemodynamically stable, decreased breath sounds at right base. Right shoulder was tender to palpation. He was found with a supra-therapeutic INR and acutely anemic. Right humeral fracture, admitted for further evaluation. Right pleural effusion with hypoxic respiratory failure. S/P thoracentesis 2 l removed, diuresing well. Pending vascular surgery to re-evaluate foot wound.    Assessment & Plan:   Principal Problem:   Right humeral fracture Active Problems:   DM (diabetes mellitus) type II uncontrolled, periph vascular disorder ([name])   S/P AVR (aortic valve replacement)   Chronic atrial fibrillation ([name])   Chronic diastolic heart failure ([name])   Pleural effusion   Anemia   Supratherapeutic INR   Chronic respiratory failure ([place])   Fall   Alzheimer's dementia   Fracture of humerus neck, right, closed, initial encounter   1. Right humeral fracture. Patient responding well to immobilization andpain control, continue acetaminophen and morphine IV.  2. Coagulopathy due to vitamin K antagonist. Will continue to hold on warfarin in case there is need for surgical intervention on his left foot.   3. Chronic atrial fibrillation. Continue as needed metoprolol IV.  Will transfer patient to medical floor on telemetry. Telemetry paced rhythm. Patient had 4 beats of V tach, non sustained VT, personally reviewed, monomorphic at 930 on 07/28/2016.   4. Right sided pleural effusion with acute hypoxic respiratory failure. Patient had successful thoracentesis, 2 liters, of transudate pleural effusion, with  improved oxygenation, urine output 1,375 cc over last 24 hours, will continue to keep negative fluid balance. Will resume po furosemide. Will wean down fi02 to target 02 sat above 92%.   5. Acute anemia. Avoiding excessive blood sampling, Hb has been stable at 8.0. SP blood transfusion on admission 1 unit prbc.   6. CKD stage 3. Renal function continue to be stable cr at 1.0 from 1.1. K down to 4,9, will continue diuresis with furosemide to target negative fluid balance. Urine output over last 24 hours  1375. Follow renal panel in am.  7. T2DM. Continue glucose cover and monitoring, with insulin sliding scale. Capillary glucose 182-144-171. Patient tolerating po well.  8. Dementia. No agitation, pain well controlled. Continue donepezil.   9. Hypotension. Systlic blood pressure [id number] to 120. Tolerating well diuresis with furosemide, continue to hold on antihypertensive medications.   10. Left foot osteomyelitis. Per wound care team will call vascular surgery to evaluate necrotic tissue.   DVT prophylaxis:coagulopathic  Code Status:full  Family Communication:No family at the bedside  Disposition Plan:snf.   Consultants:  Orthopedics.   Vascular surgery  Procedures:  Antimicrobials  Subjective: Patient is feeling better, no pain, no nausea or vomiting. On high flow nasal cannula and oxygen saturation 100%  Objective: Vitals:   07/29/16 0433 07/29/16 0500 07/29/16 0600 07/29/16 0736  BP: (!) 116/47 (!) 109/50 (!) 118/44 (!) 115/47  Pulse: (!) 59 (!) 59 (!) 59 (!) 59  Resp: 20 19 18 16   Temp: 97.5 F (36.4 C)   97.4 F (36.3 C)  TempSrc: Oral   Oral  SpO2: 100% 97% 100% 100%  Weight:      Height:  Intake/Output Summary (Last 24 hours) at 07/29/16 0749 Last data filed at 07/29/16 0600  Gross per 24 hour  Intake              720 ml  Output             1375 ml  Net             -655 ml   Filed Weights   07/24/16 2133 07/24/16 2234 07/25/16 0530    Weight: 88.5 kg (195 lb) 88.5 kg (195 lb) 89.2 kg (196 lb 9.6 oz)    Examination:  General exam: deconditioned E ENT: mild pallor, no icterus. Oral mucosa mild dry.  Respiratory system: Decreased breath sounds at bases, no wheezing, rales or rhonchi. Respiratory effort normal. Cardiovascular system: S1 & S2 heard, RRR. No JVD, murmurs, rubs, gallops or clicks. No pedal edema. Gastrointestinal system: Abdomen is nondistended, soft and nontender. No organomegaly or masses felt. Normal bowel sounds heard. Central nervous system: Alert and oriented. No focal neurological deficits. Extremities: Symmetric 5 x 5 power. Left foot with dressing in place.  Skin: No rashes, lesions or ulcers   Data Reviewed: I have personally reviewed following labs and imaging studies  CBC:  Recent Labs Lab 07/24/16 2149 07/25/16 0550 07/25/16 1343 07/25/16 2046 07/26/16 0414 07/27/16 0150 07/28/16 0302  WBC 12.6* 10.8*  --  9.9 10.6* 10.4 11.8*  NEUTROABS 10.5*  --   --  6.9 8.1* 7.8* 9.4*  HGB 7.3* 8.4* 8.2* 8.3* 8.9* 8.3* 8.0*  HCT 23.1* 27.4* 27.0* 26.9* 29.1* 27.3* 26.2*  MCV 89.5 89.0  --  88.5 89.0 89.5 90.3  PLT 240 290  --  301 326 331 [id number]   Basic Metabolic Panel:  Recent Labs Lab 07/26/16 0414 07/27/16 0150 07/27/16 0600 07/28/16 0302 07/29/16 0302  NA 137 135 135 136 139  K 4.9 5.5* 5.5* 5.4* 4.9  CL 100* 100* 99* 98* 97*  CO2 31 29 30  32 34*  GLUCOSE 119* 135* 139* 164* 158*  BUN 43* 46* 48* 54* 48*  CREATININE 1.36* 1.21 1.19 1.11 1.08  CALCIUM 9.3 9.3 9.3 9.5 9.7   GFR: Estimated Creatinine Clearance: 62.4 mL/min (by C-G formula based on SCr of 1.08 mg/dL). Liver Function Tests:  Recent Labs Lab 07/24/16 2149  AST 50*  ALT 17  ALKPHOS 134*  BILITOT 0.4  PROT 6.3*  ALBUMIN 2.1*   No results for input(s): LIPASE, AMYLASE in the last 168 hours. No results for input(s): AMMONIA in the last 168 hours. Coagulation Profile:  Recent Labs Lab 07/26/16 0414  07/26/16 1244 07/27/16 0600 07/27/16 1140 07/28/16 0302  INR 2.62 2.60 2.44 2.36 1.54   Cardiac Enzymes: No results for input(s): CKTOTAL, CKMB, CKMBINDEX, TROPONINI in the last 168 hours. BNP (last 3 results) No results for input(s): PROBNP in the last 8760 hours. HbA1C: No results for input(s): HGBA1C in the last 72 hours. CBG:  Recent Labs Lab 07/28/16 0752 07/28/16 1154 07/28/16 1720 07/28/16 2113 07/29/16 0738  GLUCAP 142* 125* 117* 182* 144*   Lipid Profile: No results for input(s): CHOL, HDL, LDLCALC, TRIG, CHOLHDL, LDLDIRECT in the last 72 hours. Thyroid Function Tests: No results for input(s): TSH, T4TOTAL, FREET4, T3FREE, THYROIDAB in the last 72 hours. Anemia Panel: No results for input(s): VITAMINB12, FOLATE, FERRITIN, TIBC, IRON, RETICCTPCT in the last 72 hours. Sepsis Labs:  Recent Labs Lab 07/25/16 2046  LATICACIDVEN 0.6    Recent Results (from the past 240 hour(s))  MRSA PCR Screening  Status: None   Collection Time: 07/25/16  5:28 AM  Result Value Ref Range Status   MRSA by PCR NEGATIVE NEGATIVE Final    Comment:        The GeneXpert MRSA Assay (FDA approved for NASAL specimens only), is one component of a comprehensive MRSA colonization surveillance program. It is not intended to diagnose MRSA infection nor to guide or monitor treatment for MRSA infections.   Gram stain     Status: None   Collection Time: 07/28/16  3:09 PM  Result Value Ref Range Status   Specimen Description FLUID RIGHT PLEURAL  Final   Special Requests NONE  Final   Gram Stain   Final    RARE WBC PRESENT, PREDOMINANTLY PMN NO ORGANISMS SEEN    Report Status 07/28/2016 FINAL  Final         Radiology Studies: Dg Chest 1 View  Result Date: 07/28/2016 CLINICAL DATA:  82 year old male status post right chest ultrasound-guided thoracentesis today. Initial encounter. EXAM: CHEST 1 VIEW COMPARISON:  07/27/2016 and earlier. FINDINGS: AP semi upright view at 1513  hours. Decreased right pleural effusion with no pneumothorax. Small volume residual including some pleural fluid in the minor fissure. Improved right mid and lower lung ventilation. Stable cardiomegaly and mediastinal contours. Stable left lung. IMPRESSION: Decreased right pleural effusion and increased right lung ventilation with no pneumothorax. Electronically Signed   By: Genevie Ann M.D.   On: 07/28/2016 15:32   US Thoracentesis Asp Pleural Space W/img Guide  Result Date: 07/28/2016 INDICATION: Patient with hypoxic respiratory failure and right pleural effusion. Request is made for diagnostic and therapeutic thoracentesis. EXAM: ULTRASOUND GUIDED DIAGNOSTIC AND THERAPEUTIC RIGHT THORACENTESIS MEDICATIONS: 10 mL 1% lidocaine COMPLICATIONS: None immediate. PROCEDURE: An ultrasound guided thoracentesis was thoroughly discussed with the patient and questions answered. The benefits, risks, alternatives and complications were also discussed. The patient understands and wishes to proceed with the procedure. Written consent was obtained. Ultrasound was performed to localize and mark an adequate pocket of fluid in the right chest. The area was then prepped and draped in the normal sterile fashion. 1% Lidocaine was used for local anesthesia. Under ultrasound guidance a Safe-T-centesis catheter was introduced. Thoracentesis was performed. The catheter was removed and a dressing applied. FINDINGS: A total of approximately 2.1 liters of clear, yellow fluid was removed. Samples were sent to the laboratory as requested by the clinical team. IMPRESSION: Successful ultrasound guided diagnostic and therapeutic right thoracentesis yielding 2.1 liters of pleural fluid. Read by:  Brynda Greathouse PA-C Electronically Signed   By: Corrie Mckusick D.O.   On: 07/28/2016 15:34        Scheduled Meds: . aspirin EC  81 mg Oral Daily  . donepezil  10 mg Oral QHS  . ezetimibe-simvastatin  1 tablet Oral QHS  . insulin aspart  0-9 Units  Subcutaneous TID WC  . nystatin   Topical TID   Continuous Infusions:   LOS: 5 days       Khary Schaben Gerome Apley, MD [facility] Pager [phone]  If 7PM-7AM, please contact night-coverage [email or web address] Password [facility] 07/29/2016, 7:49 AM

## 2016-07-29 NOTE — Progress Notes (Signed)
1st attempt to call report, RN will call back.

## 2016-07-29 NOTE — Clinical Social Work Note (Signed)
Clinical Social Work Assessment  Patient Details  Name: [name]. MRN: [id number] Date of Birth: 1935/04/25  Date of referral:  07/29/16               Reason for consult:  Facility Placement, Discharge Planning                Permission sought to share information with:  Facility Sport and exercise psychologist, Family Supports Permission granted to share information::  No  Name::     [name]  Agency::  SNFs  Relationship::  Wife  Contact Information:     Housing/Transportation Living arrangements for the past 2 months:  [place], [place] of Information:  Spouse Patient Interpreter Needed:  None Criminal Activity/Legal Involvement Pertinent to Current Situation/Hospitalization:  No - Comment as needed Significant Relationships:  Adult Children, Spouse Lives with:  Spouse Do you feel safe going back to the place where you live?  Yes Need for family participation in patient care:  Yes (Comment)  Care giving concerns:  CSW received consult regarding discharge planning. Patient is disoriented. CSW spoke with patient's spouse. She reported that patient is from [facility], but she is not sure she wants him to go back there because they did not look after him. CSW to continue to follow for needs.    Social Worker assessment / plan:  Patient's wife did not understand why [place] didn't have bed alarms. CSW explained that SNFs do not have equipment that specializes in helping patients with Dementia.    Employment status:  Retired Forensic scientist:  [employer] PT Recommendations:  [place] / Referral to community resources:  [place]  Patient/Family's Response to care:  CSW explained barriers to placing patient in a different SNF, but sent out new referral.   Patient/Family's Understanding of and Emotional Response to Diagnosis, Current Treatment, and Prognosis:  Patient/family is realistic regarding  therapy needs and expressed being hopeful for a different SNF placement. Patient's spouse expressed understanding of CSW role and discharge process. No questions/concerns about plan or treatment.    Emotional Assessment Appearance:  Appears older than stated age Attitude/Demeanor/Rapport:  Unable to Assess Affect (typically observed):  Unable to Assess Orientation:  Oriented to Self, Oriented to Place Alcohol / Substance use:  Not Applicable Psych involvement (Current and /or in the community):  No (Comment)  Discharge Needs  Concerns to be addressed:  Care Coordination Readmission within the last 30 days:  Yes Current discharge risk:  Cognitively Impaired Barriers to Discharge:  Continued Medical Work up   [employer], [place] 07/29/2016, 9:15 AM

## 2016-07-30 ENCOUNTER — Inpatient Hospital Stay (HOSPITAL_COMMUNITY): Payer: Medicare Other

## 2016-07-30 DIAGNOSIS — I5032 Chronic diastolic (congestive) heart failure: Secondary | ICD-10-CM

## 2016-07-30 DIAGNOSIS — I472 Ventricular tachycardia: Secondary | ICD-10-CM

## 2016-07-30 DIAGNOSIS — W19XXXD Unspecified fall, subsequent encounter: Secondary | ICD-10-CM

## 2016-07-30 LAB — PROTIME-INR
INR: 1.3
Prothrombin Time: 16.2 seconds — ABNORMAL HIGH (ref 11.4–15.2)

## 2016-07-30 LAB — CBC
HCT: 27.7 % — ABNORMAL LOW (ref 39.0–52.0)
HEMOGLOBIN: 8.2 g/dL — AB (ref 13.0–17.0)
MCH: 26.7 pg (ref 26.0–34.0)
MCHC: 29.6 g/dL — AB (ref 30.0–36.0)
MCV: 90.2 fL (ref 78.0–100.0)
PLATELETS: 368 10*3/uL (ref 150–400)
RBC: 3.07 MIL/uL — AB (ref 4.22–5.81)
RDW: 16.1 % — ABNORMAL HIGH (ref 11.5–15.5)
WBC: 9.9 10*3/uL (ref 4.0–10.5)

## 2016-07-30 LAB — MAGNESIUM: MAGNESIUM: 1.9 mg/dL (ref 1.7–2.4)

## 2016-07-30 LAB — BASIC METABOLIC PANEL
Anion gap: 6 (ref 5–15)
BUN: 37 mg/dL — AB (ref 6–20)
CHLORIDE: 97 mmol/L — AB (ref 101–111)
CO2: 35 mmol/L — ABNORMAL HIGH (ref 22–32)
CREATININE: 0.91 mg/dL (ref 0.61–1.24)
Calcium: 9.7 mg/dL (ref 8.9–10.3)
GFR calc non Af Amer: >60 mL/min (ref >60)
Glucose, Bld: 158 mg/dL — ABNORMAL HIGH (ref 65–99)
Potassium: 4.3 mmol/L (ref 3.5–5.1)
Sodium: 138 mmol/L (ref 135–145)

## 2016-07-30 LAB — GLUCOSE, CAPILLARY
Glucose-Capillary: 138 mg/dL — ABNORMAL HIGH (ref 65–99)
Glucose-Capillary: 178 mg/dL — ABNORMAL HIGH (ref 65–99)
Glucose-Capillary: 136 mg/dL — ABNORMAL HIGH (ref 65–99)
Glucose-Capillary: 121 mg/dL — ABNORMAL HIGH (ref 65–99)

## 2016-07-30 LAB — HEPARIN LEVEL (UNFRACTIONATED): HEPARIN UNFRACTIONATED: 0.89 [IU]/mL — AB (ref 0.30–0.70)

## 2016-07-30 MED ORDER — CEFAZOLIN IN D5W 1 GM/50ML IV SOLN
1.0000 g | INTRAVENOUS | Status: AC
  Administered 2016-07-31: 2 g via INTRAVENOUS

## 2016-07-30 MED ORDER — MAGNESIUM SULFATE 2 GM/50ML IV SOLN
2.0000 g | Freq: Once | INTRAVENOUS | Status: AC
  Administered 2016-07-30: 2 g via INTRAVENOUS
  Filled 2016-07-30: qty 50

## 2016-07-30 MED ORDER — METOPROLOL TARTRATE 12.5 MG HALF TABLET
12.5000 mg | ORAL_TABLET | Freq: Two times a day (BID) | ORAL | Status: DC
  Administered 2016-07-30 – 2016-08-03 (×7): 12.5 mg via ORAL
  Filled 2016-07-30 (×8): qty 1

## 2016-07-30 MED ORDER — HEPARIN (PORCINE) IN NACL 100-0.45 UNIT/ML-% IJ SOLN: 1150.0000 [IU]/h | INTRAMUSCULAR | Status: AC

## 2016-07-30 MED ORDER — HEPARIN (PORCINE) IN NACL 100-0.45 UNIT/ML-% IJ SOLN
1350.0000 [IU]/h | INTRAMUSCULAR | Status: DC
  Administered 2016-07-30: 1350 [IU]/h via INTRAVENOUS
  Filled 2016-07-30: qty 250

## 2016-07-30 MED ORDER — HEPARIN BOLUS VIA INFUSION
2000.0000 [IU] | Freq: Once | INTRAVENOUS | Status: AC
  Administered 2016-07-30: 2000 [IU] via INTRAVENOUS
  Filled 2016-07-30: qty 2000

## 2016-07-30 NOTE — Progress Notes (Addendum)
   VASCULAR SURGERY ASSESSMENT & PLAN:  I did some excisional debridement of the left great toe amputation site last night but given that the bone is exposed, he will need further work on the left foot. I have discussed this with Dr. Trula Slade who will proceed with further debridement of the left foot and possible placement of a negative pressure dressing. This has been scheduled for tomorrow.  His Coumadin has been on hold. INR 2 days ago was 1.54. Check INR today. If OK could give small dose of Coumadin tonight so that this does not delay his D/C further.   SUBJECTIVE: No complaints.  PHYSICAL EXAM: Vitals:   07/29/16 1200 07/29/16 1312 07/29/16 2311 07/30/16 0534  BP: (!) 120/54 (!) 101/47 (!) 117/47 (!) 110/43  Pulse: (!) 59 60 63 62  Resp: 18  (!) 21 20  Temp:  97.6 F (36.4 C) 97.7 F (36.5 C) 98 F (36.7 C)  TempSrc:  Axillary Axillary Axillary  SpO2: 98% 99% 95% 98%  Weight:    188 lb 12.8 oz (85.6 kg)  Height:       Dressing with minimal drainage.  LABS: Lab Results  Component Value Date   WBC 11.8 (H) 07/28/2016   HGB 8.0 (L) 07/28/2016   HCT 26.2 (L) 07/28/2016   MCV 90.3 07/28/2016   PLT 308 07/28/2016   Lab Results  Component Value Date   CREATININE 1.08 07/29/2016   Lab Results  Component Value Date   INR 1.54 07/28/2016   PROTIME 16.2 12/05/2008   CBG (last 3)   Recent Labs  07/29/16 1127 07/29/16 1619 07/29/16 2104  GLUCAP 171* 117* 116*    Principal Problem:   Right humeral fracture Active Problems:   DM (diabetes mellitus) type II uncontrolled, periph vascular disorder ([name])   S/P AVR (aortic valve replacement)   Chronic atrial fibrillation ([name])   Chronic diastolic heart failure ([name])   Pleural effusion   Anemia   Supratherapeutic INR   Chronic respiratory failure ([place])   Fall   Alzheimer's dementia   Fracture of humerus neck, right, closed, initial encounter    Gae Gallop Beeper: [id number] 07/30/2016

## 2016-07-30 NOTE — Consult Note (Signed)
Cardiology Consult    Patient ID: [name]. MRN: [id number], DOB/AGE: 1934-12-15   Admit date: 07/24/2016 Date of Consult: 07/30/2016  Primary Physician: [name], MD Primary Cardiologist: Dr. Aundra Dubin Requesting Provider: Dr. Algis Liming  Patient Profile    [name] is a 82 yo male with a PMH significant for diastolic heart failure, AS s/p AVR, CAD s/p CABG, sick sinus syndrome and complete heart block s/p PPM, atrial fibrillation on coumadin with hx of left atrial thrombus in 2012, PVD, COPD on home oxygen, DM, s/p left foot osteomyelitis s/p amputation (06/2016), and dementia. He presented to the ED after a fall and found to have a right humeral neck fracture. Cardiology was consulted for nonsustained VT on telemetry.  Past Medical History   Past Medical History:  Diagnosis Date  . Aortal stenosis 2006   biophrostetic  . Arthritis   . Atrial fibrillation ([place])    onset after CABG--- coumadin management [facility]  . Carotid artery disease ([place]) 09/27/2010   Carotid US 12/17: bilat ICA 40-59 >> 1 year FU  . CHF (congestive heart failure) ([place])    abter CABG  . Complete heart block ([place])   . Coronary artery disease   . DM type 2 (diabetes mellitus, type 2) ([place])    used to see Dr Meredith Pel, now f/u by Dr Larose Kells  . Erectile dysfunction   . Gangrene ([place])   . History of nephrolithiasis   . HTN (hypertension)   . Hyperlipidemia   . Memory loss   . Obesity   . OSA (obstructive sleep apnea)    not using CPAP  . Pacemaker 2/07   VVI  . PVD (peripheral vascular disease) ([name])    CT angio showed- vascular insuff, intestine and RAS bilaterally, andgiogram 02/2009 severe PVD medical management  . S/P aortic valve replacement   . Sick sinus syndrome ([name])    [name]  . Skin cancer    surgery (nose) 09-2010    Past Surgical History:  Procedure Laterality Date  . ABDOMINAL AORTAGRAM N/A 11/10/2012   Procedure: ABDOMINAL Maxcine Ham;  Surgeon: Serafina Mitchell,  MD;  Location: [facility] CATH LAB;  Service: Cardiovascular;  Laterality: N/A;  . AMPUTATION  01/2010   great toe  . AMPUTATION Left 07/16/2016   Procedure: RECECTION OF LEFT GREAT TOE METATARSAL HEAD and sesamoid;  Surgeon: Waynetta Sandy, MD;  Location: [place];  Service: Vascular;  Laterality: Left;  . ANGIOPLASTY / STENTING FEMORAL  02/10/10   Left femoral  . ANGIOPLASTY ILLIAC ARTERY Left 07/16/2016   Procedure: drug coated Balloon ANGIOPLASTY of left superficial femoral artry stent;  Surgeon: Waynetta Sandy, MD;  Location: [place];  Service: Vascular;  Laterality: Left;  . AORTIC VALVE REPLACEMENT     biophrostetic for Ao Stenosis 2006  . AORTIC VALVE REPLACEMENT  01/17/2011   S/P redo median sternotomy, extracorporeal circulation, redo Aortic Valve Replacement using a 23-mm Edwards pericardial Magna-Ease valve, Dr Ulla Gallo  . AORTOGRAM N/A 07/16/2016   Procedure: left lower extremity angiogram;  Surgeon: Waynetta Sandy, MD;  Location: [facility] OR;  Service: Vascular;  Laterality: N/A;  . CATARACT EXTRACTION, BILATERAL    . CORONARY ARTERY BYPASS GRAFT  2006  . EP IMPLANTABLE DEVICE N/A 06/06/2016   Procedure: PPM Generator Changeout;  Surgeon: Thompson Grayer, MD;  Location: [place] CV LAB;  Service: Cardiovascular;  Laterality: N/A;  . EYE SURGERY    . ILIAC ATHERECTOMY Left 07/16/2016  Procedure: Angioplasty and ATHERECTOMY of left anterior tibial artery;  Surgeon: Waynetta Sandy, MD;  Location: [place];  Service: Vascular;  Laterality: Left;  . INCISE AND DRAIN ABCESS Right May 2014   right heel   . LOWER EXTREMITY ANGIOGRAM Left 11/23/2012   Procedure: LOWER EXTREMITY ANGIOGRAM;  Surgeon: Serafina Mitchell, MD;  Location: [facility] CATH LAB;  Service: Cardiovascular;  Laterality: Left;  . PACEMAKER PLACEMENT  07/2005   Medtronic Sigma SR implanted by Dr Olevia Perches  . PERIPHERAL VASCULAR CATHETERIZATION N/A 07/10/2016   Procedure: Abdominal Aortogram;  Surgeon: Conrad Belfast, MD;  Location: [place] CV LAB;  Service: Cardiovascular;  Laterality: N/A;  . PERIPHERAL VASCULAR CATHETERIZATION Bilateral 07/10/2016   Procedure: Lower Extremity Angiography;  Surgeon: Conrad , MD;  Location: [place] CV LAB;  Service: Cardiovascular;  Laterality: Bilateral;  . TONSILLECTOMY    . ULTRASOUND GUIDANCE FOR VASCULAR ACCESS Bilateral 07/16/2016   Procedure: ULTRASOUND GUIDANCE FOR VASCULAR ACCESS of right common femoral and left dorsalis pedis;  Surgeon: Waynetta Sandy, MD;  Location: [facility] OR;  Service: Vascular;  Laterality: Bilateral;     Allergies  Allergies  Allergen Reactions  . Potassium-Containing Compounds Anaphylaxis    IV--loss of conciousness  . Metoprolol Succinate Other (See Comments)    Extremely tired    History of Present Illness    [name] was last seen in our clinic by Dr. Aundra Dubin on 03/19/16. He has a PPM for Afib and complete heart block with a recent generator change out with Dr. Rayann Heman on 06/06/16. He was recently hospitalized  (07/03/16 - 07/21/16) for respiratory failure, found to have right-sided transudative pleural effusion requiring thoracentesis. He was diuresed at that time and underwent resection 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 head and sesmoid bone. He was discharged to SNF for approximately 3 days, but had a mechanical fall. His wife notes he had been more lethargic than usual at the SNF. He was brought to the ED and found to have a right humeral neck fracture. Ortho was consulted. Pt was also noted to have non-sustained VT on telemetry. Electrolytes were WNL. Cardiology was consulted. On my interview, patient has no complaints, denies chest pain, SOB, palpitations, and swelling. No family at bedside.  Inpatient Medications    . aspirin EC  81 mg Oral Daily  . [START ON 07/31/2016]  ceFAZolin (ANCEF) IV  1 g Intravenous To SS-Surg  . donepezil  10 mg Oral QHS  . ezetimibe-simvastatin  1 tablet Oral QHS  . furosemide  40 mg Oral  Daily  . insulin aspart  0-9 Units Subcutaneous TID WC  . nystatin   Topical TID     Outpatient Medications    Prior to Admission medications   Medication Sig Start Date End Date Taking? Authorizing Provider  acetaminophen (TYLENOL) 325 MG tablet Take 2 tablets (650 mg total) by mouth every 4 (four) hours as needed for headache or mild pain. 07/21/16  Yes Oswald Hillock, MD  aspirin EC 81 MG EC tablet Take 1 tablet (81 mg total) by mouth daily. 07/22/16  Yes Oswald Hillock, MD  donepezil (ARICEPT) 10 MG tablet Take 1 tablet (10 mg total) by mouth at bedtime. 05/08/16  Yes Kathrynn Ducking, MD  ezetimibe-simvastatin (VYTORIN) 10-40 MG per tablet Take 1 tablet by mouth at bedtime. 05/06/12  Yes Larey Dresser, MD  furosemide (LASIX) 40 MG tablet Take 1 tablet (40 mg total) by mouth 2 (two) times daily. 07/21/16  Yes  Oswald Hillock, MD  insulin aspart (NOVOLOG) 100 UNIT/ML injection Inject 0-9 Units into the skin 3 (three) times daily with meals. Sliding scale insulin Less than 70 initiate hypoglycemia protocol 70-120  0 units 120-150 1 unit 151-200 2 units 201-250 3 units 251-300 5 units 301-350 7 units 351-400 9 units  Greater than 400 call MD 07/21/16  Yes Oswald Hillock, MD  insulin glargine (LANTUS) 100 UNIT/ML injection Inject 0.15 mLs (15 Units total) into the skin at bedtime. 06/07/14  Yes Belkys A Regalado, MD  lisinopril (PRINIVIL,ZESTRIL) 10 MG tablet Take 1 tablet (10 mg total) by mouth daily. 07/25/15  Yes Liliane Shi, PA-C  Multiple Vitamins-Minerals (DECUBI-VITE) CAPS Take 1 capsule by mouth daily.   Yes Historical Provider, MD  OVER THE COUNTER MEDICATION Take 120 mLs by mouth daily. Med pass   Yes Historical Provider, MD  warfarin (COUMADIN) 3 MG tablet Take 4.5 mg by mouth daily at 6 PM.   Yes Historical Provider, MD     Family History    Family History  Problem Relation Age of Onset  . Heart attack Father 68  . Heart disease Father     Before age 29  . Hypertension  Father   . Stroke Mother 77  . Cancer Sister     Cervical  . Heart attack Sister   . Heart disease Sister   . Diabetes Sister     2 sister w/ DM  . Heart attack Sister   . Heart attack Sister   . Heart disease Sister 37    AAA  and  Stomach Aneurysm  . AAA (abdominal aortic aneurysm) Sister   . Heart disease Sister     Before age 80  . Heart attack Sister 5  . Colon cancer Neg Hx   . Prostate cancer Neg Hx     Social History    Social History   Social History  . Marital status: Married    Spouse name: N/A  . Number of children: 2  . Years of education: N/A   Occupational History  . retired- long distance Administrator  Retired   Social History Main Topics  . Smoking status: Former Smoker    Types: Cigarettes    Quit date: 06/23/1988  . Smokeless tobacco: Former Systems developer  . Alcohol use No  . Drug use: No  . Sexual activity: Not Currently   Other Topics Concern  . Not on file   Social History Narrative   Diet: healthy most of the time--   2 step children      Patient drinks about 3-4 cups of caffeine daily.   Patient is right handed.               Review of Systems    General:  No chills, fever, night sweats or weight changes.  Cardiovascular:  No chest pain, dyspnea on exertion, edema, orthopnea, palpitations, paroxysmal nocturnal dyspnea. Dermatological: No rash, lesions/masses Respiratory: No cough, dyspnea Urologic: No hematuria, dysuria Abdominal:   No nausea, vomiting, diarrhea, bright red blood per rectum, melena, or hematemesis Neurologic:  No visual changes All other systems reviewed and are otherwise negative except as noted above.  Physical Exam    Blood pressure (!) 120/46, pulse 60, temperature 98.3 F (36.8 C), temperature source Oral, resp. rate (!) 22, height 5\' 10"  (1.778 m), weight 188 lb 12.8 oz (85.6 kg), SpO2 98 %.  General: Pleasant, NAD Psych: Normal affect. Neuro: Sedated, answers questions appropriately. Moves  all extremities  spontaneously. HEENT: Normal  Neck: Supple without bruits, no JVD Lungs:  Resp regular and unlabored, greatly diminished in B bases Heart: RRR, no murmurs Abdomen: Soft, non-tender, non-distended, BS + x 4.  Extremities: Right arm in sling, both LE with dressings/boots in place from vascular surgery, shins with trace edema  Labs    Troponin (Point of Care Test) No results for input(s): TROPIPOC in the last 72 hours. No results for input(s): CKTOTAL, CKMB, TROPONINI in the last 72 hours. Lab Results  Component Value Date   WBC 9.9 07/30/2016   HGB 8.2 (L) 07/30/2016   HCT 27.7 (L) 07/30/2016   MCV 90.2 07/30/2016   PLT 368 07/30/2016    Recent Labs Lab 07/24/16 2149  07/30/16 0825  NA 136  < > 138  K 4.7  < > 4.3  CL 99*  < > 97*  CO2 29  < > 35*  BUN 44*  < > 37*  CREATININE 1.26*  < > 0.91  CALCIUM 9.1  < > 9.7  PROT 6.3*  --   --   BILITOT 0.4  --   --   ALKPHOS 134*  --   --   ALT 17  --   --   AST 50*  --   --   GLUCOSE 205*  < > 158*  < > = values in this interval not displayed. Lab Results  Component Value Date   CHOL 133 03/19/2016   HDL 36 (L) 03/19/2016   LDLCALC 74 03/19/2016   TRIG 114 03/19/2016   No results found for: [facility]   Radiology Studies    Dg Chest 1 View  Result Date: 07/28/2016 CLINICAL DATA:  82 year old male status post right chest ultrasound-guided thoracentesis today. Initial encounter. EXAM: CHEST 1 VIEW COMPARISON:  07/27/2016 and earlier. FINDINGS: AP semi upright view at 1513 hours. Decreased right pleural effusion with no pneumothorax. Small volume residual including some pleural fluid in the minor fissure. Improved right mid and lower lung ventilation. Stable cardiomegaly and mediastinal contours. Stable left lung. IMPRESSION: Decreased right pleural effusion and increased right lung ventilation with no pneumothorax. Electronically Signed   By: Genevie Ann M.D.   On: 07/28/2016 15:32   Dg Chest 1 View  Result Date: 07/24/2016 CLINICAL  DATA:  [place] [facility] day nursing facility. EXAM: CHEST 1 VIEW COMPARISON:  07/14/2016 FINDINGS: Mildly comminuted and displaced proximal right humeral fracture. Moderate vascular and interstitial prominence, worsened. Central and basilar airspace opacities likely represent alveolar edema. Moderate right pleural effusion, unchanged or slightly enlarged. Unchanged cardiomegaly. The transvenous cardiac lead appears grossly intact. Prior sternotomy with CABG and aortic valvuloplasty. IMPRESSION: Interstitial and vascular changes, as well as central airspace opacities, consistent with congestive heart failure. Moderate right pleural effusion. Electronically Signed   By: Andreas Newport M.D.   On: 07/24/2016 21:06   Dg Chest 1 View  Result Date: 07/04/2016 CLINICAL DATA:  Right-sided thoracentesis. EXAM: CHEST 1 VIEW COMPARISON:  07/04/2015 FINDINGS: Small right pleural effusion. No right pneumothorax. Left lung is clear. Stable cardiomediastinal silhouette. Prior CABG. Single lead cardiac pacemaker. No acute osseous abnormality. IMPRESSION: 1. Small right pleural effusion. No right pneumothorax status post right thoracentesis. Electronically Signed   By: Kathreen Devoid   On: 07/04/2016 11:03   Dg Chest 2 View  Result Date: 07/14/2016 CLINICAL DATA:  Shortness of breath. EXAM: CHEST  2 VIEW COMPARISON:  07/12/2016 FINDINGS: The heart is mildly enlarged but stable. Stable right ventricular pacer  wire. The lungs demonstrate much improved aeration with resolving pulmonary edema. There is a persistent right-sided pleural effusion and overlying atelectasis. IMPRESSION: Resolving pulmonary edema. Persistent small right pleural effusion and overlying atelectasis. Electronically Signed   By: Marijo Sanes M.D.   On: 07/14/2016 12:06   Dg Chest 2 View  Result Date: 07/03/2016 CLINICAL DATA:  Left foot wound EXAM: CHEST  2 VIEW COMPARISON:  06/20/2016 FINDINGS: Cardiac shadow remains enlarged. Pacing device is again  seen and stable. Postsurgical changes are again noted and stable. Increasing right-sided effusion and underlying infiltrate/atelectasis is present. The left lung remains clear. No bony abnormality is seen. IMPRESSION: Increasing right-sided effusion and likely underlying infiltrate/atelectasis. Electronically Signed   By: Inez Catalina M.D.   On: 07/03/2016 16:05   Dg Shoulder Right  Result Date: 07/24/2016 CLINICAL DATA:  Fall with right arm pain. Humerus fracture on outside radiograph. EXAM: RIGHT SHOULDER - 2+ VIEW COMPARISON:  None. FINDINGS: Oblique fracture of the humeral neck is mildly comminuted. There is 18 mm lateral and mild anterior displacement of the humeral diaphysis. No evidence of extension to the glenohumeral joint. Chronic deformity of the right acromioclavicular joint fragmentation. Vascular calcifications are seen. IMPRESSION: Displaced mildly comminuted humeral neck fracture. No evidence of intra-articular extension. Electronically Signed   By: Jeb Levering M.D.   On: 07/24/2016 21:07   Ct Head Wo Contrast  Result Date: 07/24/2016 CLINICAL DATA:  Status post fall. EXAM: CT HEAD WITHOUT CONTRAST CT CERVICAL SPINE WITHOUT CONTRAST TECHNIQUE: Multidetector CT imaging of the head and cervical spine was performed following the standard protocol without intravenous contrast. Multiplanar CT image reconstructions of the cervical spine were also generated. COMPARISON:  Head CT dated 01/08/2011. FINDINGS: CT HEAD FINDINGS Brain: There is generalized age related parenchymal atrophy with commensurate dilatation of the ventricles and sulci. Mild chronic small vessel ischemic changes noted within the deep periventricular white matter regions bilaterally. There is no mass, hemorrhage, edema or other evidence of acute parenchymal abnormality. No extra-axial hemorrhage. Vascular: There are chronic calcified atherosclerotic changes of the large vessels at the skull base. No unexpected hyperdense vessel.  Skull: Normal. Negative for fracture or focal lesion. Sinuses/Orbits: No acute finding. Other: None. CT CERVICAL SPINE FINDINGS Alignment: Slightly accentuated kyphosis of the lower cervical spine related to underlying degenerative change. No evidence of acute subluxation. Skull base and vertebrae: No fracture line or displaced fracture fragment identified. Soft tissues and spinal canal: No prevertebral fluid or swelling. No visible canal hematoma. Disc levels: Degenerative change within the mid and lower cervical spine, mild to moderate in degree, with associated mild disc space narrowings and osseous spurring. No more than mild central canal stenosis at any level. Upper chest: Large right pleural effusion extending to the right lung apex, with probable adjacent atelectasis. Other: Heavy atherosclerotic calcifications at each carotid bulb region and at each carotid origin. IMPRESSION: 1. No acute intracranial abnormality. No intracranial mass, hemorrhage or edema. No skull fracture. Atrophy and chronic ischemic changes in the white matter. 2. No fracture or acute subluxation within the cervical spine. Degenerative/chronic findings of the cervical spine, as detailed above. 3. Large right pleural effusion, extending to the right lung apex, with probable adjacent atelectasis. 4. Carotid atherosclerosis. Electronically Signed   By: Franki Cabot M.D.   On: 07/24/2016 21:42   Ct Cervical Spine Wo Contrast  Result Date: 07/24/2016 CLINICAL DATA:  Status post fall. EXAM: CT HEAD WITHOUT CONTRAST CT CERVICAL SPINE WITHOUT CONTRAST TECHNIQUE: Multidetector CT imaging of the  head and cervical spine was performed following the standard protocol without intravenous contrast. Multiplanar CT image reconstructions of the cervical spine were also generated. COMPARISON:  Head CT dated 01/08/2011. FINDINGS: CT HEAD FINDINGS Brain: There is generalized age related parenchymal atrophy with commensurate dilatation of the ventricles  and sulci. Mild chronic small vessel ischemic changes noted within the deep periventricular white matter regions bilaterally. There is no mass, hemorrhage, edema or other evidence of acute parenchymal abnormality. No extra-axial hemorrhage. Vascular: There are chronic calcified atherosclerotic changes of the large vessels at the skull base. No unexpected hyperdense vessel. Skull: Normal. Negative for fracture or focal lesion. Sinuses/Orbits: No acute finding. Other: None. CT CERVICAL SPINE FINDINGS Alignment: Slightly accentuated kyphosis of the lower cervical spine related to underlying degenerative change. No evidence of acute subluxation. Skull base and vertebrae: No fracture line or displaced fracture fragment identified. Soft tissues and spinal canal: No prevertebral fluid or swelling. No visible canal hematoma. Disc levels: Degenerative change within the mid and lower cervical spine, mild to moderate in degree, with associated mild disc space narrowings and osseous spurring. No more than mild central canal stenosis at any level. Upper chest: Large right pleural effusion extending to the right lung apex, with probable adjacent atelectasis. Other: Heavy atherosclerotic calcifications at each carotid bulb region and at each carotid origin. IMPRESSION: 1. No acute intracranial abnormality. No intracranial mass, hemorrhage or edema. No skull fracture. Atrophy and chronic ischemic changes in the white matter. 2. No fracture or acute subluxation within the cervical spine. Degenerative/chronic findings of the cervical spine, as detailed above. 3. Large right pleural effusion, extending to the right lung apex, with probable adjacent atelectasis. 4. Carotid atherosclerosis. Electronically Signed   By: Franki Cabot M.D.   On: 07/24/2016 21:42   Dg Chest Port 1 View  Result Date: 07/30/2016 CLINICAL DATA:  Recurrent RIGHT pleural effusion. EXAM: PORTABLE CHEST 1 VIEW COMPARISON:  07/28/2016 FINDINGS: Sternotomy wires  overlie normal cardiac silhouette. Lung bases are poorly evaluated. Fine pulmonary edema pattern similar to comparison exam. No pneumothorax. IMPRESSION: Cardiomegaly and mild pulmonary edema.  No significant change. Lung bases are poorly defined to body habitus and overlying soft tissue Electronically Signed   By: Suzy Bouchard M.D.   On: 07/30/2016 13:59   Dg Chest Port 1 View  Result Date: 07/27/2016 CLINICAL DATA:  Hypoxia. EXAM: PORTABLE CHEST 1 VIEW COMPARISON:  July 25, 2016 FINDINGS: The layering right pleural effusion is similar to mildly larger in the interval. There is probably a tiny left effusion. Stable cardiomediastinal silhouette. Increasing bilateral pulmonary opacities, right greater than left, likely mildly worsened edema. No other change. IMPRESSION: Moderate to large a layering effusion on the right, similar to mildly worsened in the interval. Increasing pulmonary edema. Tiny left effusion. Electronically Signed   By: Dorise Bullion III M.D   On: 07/27/2016 07:45   Dg Chest Port 1 View  Result Date: 07/25/2016 CLINICAL DATA:  Respiratory difficulty EXAM: PORTABLE CHEST 1 VIEW COMPARISON:  Earlier today FINDINGS: Moderate to large layering right pleural effusion. There is chronic cardiomegaly. The patient is status post CABG and aortic valve replacement. Single chamber pacer from the left in stable position. Pulmonary venous congestion. IMPRESSION: Stable compared to earlier today. Cardiomegaly and venous congestion with moderate to large layering pleural effusion on the right. Electronically Signed   By: Monte Fantasia M.D.   On: 07/25/2016 20:24   Dg Chest Port 1 View  Result Date: 07/25/2016 CLINICAL DATA:  Dyspnea, desaturation  when lying flat EXAM: PORTABLE CHEST 1 VIEW COMPARISON:  Portable exam 1745 hours compared to 07/24/2016 FINDINGS: LEFT subclavian pacemaker lead projects over RIGHT ventricle. Enlargement of cardiac silhouette post CABG and AVR. Pulmonary vascular  congestion. RIGHT pleural effusion and basilar atelectasis. Perihilar edema. No pneumothorax. Bones demineralized. IMPRESSION: Mild pulmonary edema with persistent RIGHT pleural effusion and basilar atelectasis. Electronically Signed   By: Lavonia Dana M.D.   On: 07/25/2016 18:09   Dg Chest Port 1 View  Result Date: 07/12/2016 CLINICAL DATA:  Congestive heart failure and hypoxia up. Pleural effusions. EXAM: PORTABLE CHEST 1 VIEW COMPARISON:  07/09/2016 FINDINGS: Previous median sternotomy and valve replacement. Single lead pacemaker appears unchanged. There is pulmonary venous hypertension with mild edema. This has probably improved slightly over the last 3 days. There is a moderate size right effusion with volume loss in the right lower lung. No pleural fluid seen on the left in this projection. IMPRESSION: Persistent congestive heart failure, but with slight improvement over the study 3 days ago. Persistent moderate size right effusion with volume loss in the right lower lung. Electronically Signed   By: Nelson Chimes M.D.   On: 07/12/2016 11:36   Dg Chest Port 1 View  Result Date: 07/09/2016 CLINICAL DATA:  Hypoxia EXAM: PORTABLE CHEST 1 VIEW COMPARISON:  07/04/2016 FINDINGS: Increased hazy density of the chest with interstitial coarsening. Moderate right pleural effusion, increased. Chronic cardiopericardial enlargement. Single chamber pacer from the left. Previous median sternotomy with history of aortic valve replacement. IMPRESSION: CHF pattern including moderate right pleural effusion, progressed from 5 days ago. Electronically Signed   By: Monte Fantasia M.D.   On: 07/09/2016 10:34   Dg Foot Complete Left  Result Date: 07/03/2016 CLINICAL DATA:  82 year old male with a history of possible persisting osteomyelitis EXAM: LEFT FOOT - COMPLETE 3+ VIEW COMPARISON:  MRI 06/20/2016 plain film 05/20/2016 FINDINGS: Re- demonstration of prior left toe amputation. Compare to the prior plain film there is  increasing erosive changes at the distal first metatarsal. Overlying skin/soft tissue ulceration with gauze dressing in place. Dense calcifications of the pedal vessels and visualized tibial vessels. No displaced fracture. Degenerative changes of the forefoot and midfoot. IMPRESSION: Compared to the prior plain film there appears to be progressing erosive changes at dorsal aspect of the distal first metatarsal, compatible with persisting osteomyelitis which was demonstrated on prior MRI. Soft tissue ulceration along the medial forefoot. Dense calcifications of the tibial vessels and pedal vessels. Correlation with ankle-brachial index/noninvasive imaging may be useful to evaluate wound healing capability. Signed, Dulcy Fanny. Earleen Newport, DO [facility] Radiology Electronically Signed   By: Corrie Mckusick D.O.   On: 07/03/2016 16:09   US Thoracentesis Asp Pleural Space W/img Guide  Result Date: 07/28/2016 INDICATION: Patient with hypoxic respiratory failure and right pleural effusion. Request is made for diagnostic and therapeutic thoracentesis. EXAM: ULTRASOUND GUIDED DIAGNOSTIC AND THERAPEUTIC RIGHT THORACENTESIS MEDICATIONS: 10 mL 1% lidocaine COMPLICATIONS: None immediate. PROCEDURE: An ultrasound guided thoracentesis was thoroughly discussed with the patient and questions answered. The benefits, risks, alternatives and complications were also discussed. The patient understands and wishes to proceed with the procedure. Written consent was obtained. Ultrasound was performed to localize and mark an adequate pocket of fluid in the right chest. The area was then prepped and draped in the normal sterile fashion. 1% Lidocaine was used for local anesthesia. Under ultrasound guidance a Safe-T-centesis catheter was introduced. Thoracentesis was performed. The catheter was removed and a dressing  applied. FINDINGS: A total of approximately 2.1 liters of clear, yellow fluid was  removed. Samples were sent to the laboratory as requested by the clinical team. IMPRESSION: Successful ultrasound guided diagnostic and therapeutic right thoracentesis yielding 2.1 liters of pleural fluid. Read by:  Brynda Greathouse PA-C Electronically Signed   By: Corrie Mckusick D.O.   On: 07/28/2016 15:34   US Thoracentesis Asp Pleural Space W/img Guide  Result Date: 07/04/2016 INDICATION: Patient with history of congestive heart failure and recent chest x-ray revealing a right-sided pleural effusion. Request made for diagnostic and therapeutic thoracentesis. EXAM: ULTRASOUND GUIDED DIAGNOSTIC AND THERAPEUTIC THORACENTESIS MEDICATIONS: 1% lidocaine. COMPLICATIONS: None immediate. PROCEDURE: An ultrasound guided thoracentesis was thoroughly discussed with the patient and questions answered. The benefits, risks, alternatives and complications were also discussed. The patient understands and wishes to proceed with the procedure. Written consent was obtained. Ultrasound was performed to localize and mark an adequate pocket of fluid in the right chest. The area was then prepped and draped in the normal sterile fashion. 1% Lidocaine was used for local anesthesia. Under ultrasound guidance a Safe-T-Centesis catheter was introduced. Thoracentesis was performed. The catheter was removed and a dressing applied. FINDINGS: A total of approximately 1.8 L of clear yellow fluid was removed. Samples were sent to the laboratory as requested by the clinical team. IMPRESSION: Successful ultrasound guided right thoracentesis yielding 1.8 L of pleural fluid. The procedure was terminated secondary to hypotension. Read by: Saverio Danker, PA-C Electronically Signed   By: Sandi Mariscal M.D.   On: 07/04/2016 12:17    ECG & Cardiac Imaging    EKG 07/24/16: V-paced rhythm, 70 bpm  Echocardiogram 06/03/16: Study Conclusions - Left ventricle: The cavity size was normal. Wall thickness was   increased in a pattern of mild LVH. Systolic  function was normal.   The estimated ejection fraction was in the range of 55% to 60%.   There is akinesis of the basalinferior myocardium. Doppler   parameters are consistent with high ventricular filling pressure. - Aortic valve: A bioprosthesis was present. - Mitral valve: Severely calcified annulus. There was mild   regurgitation. - Left atrium: The atrium was moderately dilated. - Right ventricle: Systolic function was mildly reduced. - Right atrium: The atrium was mildly dilated. - Pulmonary arteries: Systolic pressure was severely increased. PA   peak pressure: 83 mm Hg (S).  Impressions: - Akinesis of the basal inferior wall with overall preserved LV   systolic function; elevated LV filling pressure; mild LVH; s/p   AVR with normal gradients; moderate LAE; mild Seth; mild RAE;   mildly reduced RV function; mild TR with severely elevated   pulmonary pressure.   Assessment & Plan    1. Non-sustained VT on telemetry / Atrial fibrillation / Sick sinus syndrome / Complete heart block / s/p PPM (generator change out 06/06/16) - continue heparin gtt for anticoagulation, holding coumadin for possible future procedures - This patients CHA2DS2-VASc Score and unadjusted Ischemic Stroke Rate (% per year) is equal to 9.7 % stroke rate/year from a score of 6 (age, CHF, HTN, DM, vascular disease) - continue home med ASA; holding lisinopril and coumadin - telemetry with 2 bouts of non-sustained Vtach (9 and 13 beat runs today) - K 4.3; Mg 1.9 - recommend 1g IV Mg, keep Mg > 2.0 - recommend checking TSH - recommend repeat TTE to rule out effusion and systolic dysfunction - if LV function is stable, will need outpatient ischemic evaluation; if systolic dysfunction, may need inpatient  ischemic workup - added small dose of lopressor - continue telemetry - continue home oxygen   2. Diastolic congestive heart failure - pt with mild hypervolemia; would likely benefit from diuresis as pressure  and sCr tolerate - baseline weight in 06/2016 near 193; today 188 lbs - continue daily lasix per primary team - sCr 0.91 (1.08) with good urine output: 1.3L yesterday and net negative 3.4 L   3. CAD s/p CABG - continue ASA and vytorin - pt denies chest pain or pressure   4. Anemia - continue to trend H/H - per primary team, would recommend transfusing if Hb below 8   5. Right sided pleural effusion, recurrent - per primary team - agree with diuresis and thoracentesis (07/28/16)   4. DM - per primary team  Signed, Minette Brine, PA-C 07/30/2016, 2:49 PM   I have personally seen and examined this patient with Doreene Adas, PA-C. I agree with the assessment and plan as outlined above. [name] has known CAD s/p CABG, severe PAD, AS s/p AVR, chronic diastolic CHF, PAF, COPD, CHB s/p PPM and dementia. He is admitted after falling and sustaining a shoulder fracture. He has NSVT on telemetry. He is awake today. Oriented to person and place. CV:RRR with systolic murmur. Lungs: clear bilaterally. Abd: soft, NT. Ext: no LE edema.  I have reviewed his telemetry. He is V-paced. There are several short runs of NSVT (9 beats, 13 beats). He denies chest pain or dyspnea.  Will repeat echo. Will start low dose beta blocker. Given his multiple medical issues including dementia, hopefully we can be conservative in our approach. Will not plan ischemic evaluation at this time.   Lauree Chandler 07/30/2016 5:10 PM

## 2016-07-30 NOTE — Progress Notes (Signed)
ANTICOAGULATION CONSULT NOTE - Follow up Pharmacy Consult for Heparin Indication: atrial fibrillation  Allergies  Allergen Reactions  . Potassium-Containing Compounds Anaphylaxis    IV--loss of conciousness  . Metoprolol Succinate Other (See Comments)    Extremely tired    Patient Measurements: Height: 5\' 10"  (177.8 cm) Weight: 188 lb 12.8 oz (85.6 kg) IBW/kg (Calculated) : 73 Heparin Dosing Weight: 86  Vital Signs: Temp: 97.5 F (36.4 C) (02/07 1707) Temp Source: Oral (02/07 1707) BP: 112/45 (02/07 1707) Pulse Rate: 61 (02/07 1707)  Labs:  Recent Labs  07/28/16 0302 07/29/16 0302 07/30/16 0825 07/30/16 1352 07/30/16 1921  HGB 8.0*  --   --  8.2*  --   HCT 26.2*  --   --  27.7*  --   PLT 308  --   --  368  --   LABPROT 18.6*  --  16.2*  --   --   INR 1.54  --  1.30  --   --   HEPARINUNFRC  --   --   --   --  0.89*  CREATININE 1.11 1.08 0.91  --   --     Estimated Creatinine Clearance: 68 mL/min (by C-G formula based on SCr of 0.91 mg/dL).   Medical History: Past Medical History:  Diagnosis Date  . Aortal stenosis 2006   biophrostetic  . Arthritis   . Atrial fibrillation ([place])    onset after CABG--- coumadin management [facility]  . Carotid artery disease ([place]) 09/27/2010   Carotid US 12/17: bilat ICA 40-59 >> 1 year FU  . CHF (congestive heart failure) ([place])    abter CABG  . Complete heart block ([place])   . Coronary artery disease   . DM type 2 (diabetes mellitus, type 2) ([place])    used to see Dr Meredith Pel, now f/u by Dr Larose Kells  . Erectile dysfunction   . Gangrene ([place])   . History of nephrolithiasis   . HTN (hypertension)   . Hyperlipidemia   . Memory loss   . Obesity   . OSA (obstructive sleep apnea)    not using CPAP  . Pacemaker 2/07   VVI  . PVD (peripheral vascular disease) ([name])    CT angio showed- vascular insuff, intestine and RAS bilaterally, andgiogram 02/2009 severe PVD medical management  . S/P aortic valve replacement   . Sick sinus  syndrome ([name])    [name]  . Skin cancer    surgery (nose) 09-2010    Medications:  Scheduled:  . aspirin EC  81 mg Oral Daily  . [START ON 07/31/2016]  ceFAZolin (ANCEF) IV  1 g Intravenous To SS-Surg  . donepezil  10 mg Oral QHS  . ezetimibe-simvastatin  1 tablet Oral QHS  . furosemide  40 mg Oral Daily  . insulin aspart  0-9 Units Subcutaneous TID WC  . metoprolol tartrate  12.5 mg Oral BID  . nystatin   Topical TID    Assessment: 82yo male on Coumadin pta, now to start Heparin bridge. Also with hx of left atrial thrombus in 2012.   Pt to OR tomorrow at 0945 for debridement of great toe, amputation of 2nd toe, and placement of wound VAC.   Heparin level = 0.89, drawn ~ 5 hr after heparin bolus/drip 1350 unit/hr started. HL is above goal but drawn earlier than desired.   No bleeding noted.  Heparin is to be held x 6hr prior to OR, thus heparin drip to stop at 03:30  tomorrow AM.     Goal of Therapy:  Heparin level 0.3-0.7 units/ml Monitor platelets by anticoagulation protocol: Yes   Plan:  Decreased Heparin to 1150 units/hr Heparin off at 0330 on 2/8. Daily heparin level, CBC   Nicole Cella, RPh Clinical Pharmacist 07/30/2016 8:23 PM

## 2016-07-30 NOTE — Progress Notes (Signed)
PROGRESS NOTE  [name].  [id number] DOB: 07/26/1934  DOA: 07/24/2016 PCP: [name], MD   Brief Narrative:  82 year old male with PMH of severe AS s/p redo AVR (bioprosthetic valve), chronic diastolic CHF, CAD status post CABG, complete heart block s/p PPM, A. fib on chronic Coumadin, history of left atrial thrombus by TEE 7/12, PAD, COPD on home oxygen 2 L/m, type II DM, HTN, HLD, OSA not on CPAP, left foot osteomyelitis status post amputation of great toe, Alzheimer's dementia, recent hospitalization 1/11-1/29 for acute respiratory failure with hypoxia, noted to have a large right-sided pleural effusion for which she underwent thoracentesis, diabetic foot ulcer with osteomyelitis treated with antibiotics and then discharged to [facility] for 3 days, presented back to the ED following a fall on day of admission, found on floor. Also noted more lethargic than usual and worsening short-term memory. X-rays confirmed right humeral neck fracture. Hemoglobin 7.3. Previous consulted for left foot wound management.   Assessment & Plan:   Principal Problem:   Right humeral fracture Active Problems:   DM (diabetes mellitus) type II uncontrolled, periph vascular disorder ([name])   S/P AVR (aortic valve replacement)   Chronic atrial fibrillation ([name])   Chronic diastolic heart failure ([name])   Pleural effusion   Anemia   Supratherapeutic INR   Chronic respiratory failure ([place])   Fall   Alzheimer's dementia   Fracture of humerus neck, right, closed, initial encounter   1. Right humeral neck fracture: Sustained status post fall at SNF. X-ray of right shoulder 07/24/16 confirmed displaced mildly comminuted humeral neck fracture and no evidence of intra-articular extension. Continue right shoulder sling. Plan to discuss with orthopedics and likely outpatient follow-up.  2. Acute on chronic anemia: Hemoglobin 12 g in December 2017 but has progressively dropped and was 7.3 on admission. No  overt bleeding reported. Status post 1 unit PRBC transfusion. Hemoglobin 8 g per DL on 2/5 but has been gradually drifting again since 2/3. Follow CBC in a.m. Transfuse if hemoglobin < 8 g per DL.  3. Chronic A. fib: Presented with supratherapeutic INR of 3.6. Received vitamin K 1 mg on admission. Coumadin held. CHA2DS2VASc score is 6. INR 1.3. Discussed with vascular surgery/Dr. Doren Custard on 2/7 >Ok with IV heparin infusion given prior history of left atrial thrombus. Heparin drip be held 6 hours prior to procedure. Vpaced rhythm on monitor.  4. Right-sided pleural effusion, recurrent: Status post 2.1 L thoracentesis on 2/5 with improved follow-up chest x-ray. Transudative.  5. Leukocytosis: Mild and stable.  6. Hypotension: Initial low blood pressures of 93/48. Antihypertensives held. Improved.  7. Chronic hypoxic respiratory failure: On home oxygen 2 L/m.  8. Stage III chronic kidney disease: Stable.  9. Mild hyperkalemia: Resolved.  10. Chronic diastolic CHF: Clinically euvolemic. Continue oral Lasix.  11. CAD status post CABG: Asymptomatic of chest pain.  12. Complete heart block status post PPM: Monitor on telemetry which also shows episodes of NSVT. Request cardiology to consult.  13. Status post bioprosthetic AVR:  14. Type II DM: Reasonable inpatient control on SSI.  15. Essential hypertension: Management as above.  77. S/P angioplasty and stenting LLE and left great toe amputation: As per vascular surgery follow-up, underwent excisional debridement of left great toe amputation site on 2/6 night and plans for further intervention on 2/8 followed by negative pressure dressing.  17. Nonsustained VT: 2-D echo of/12/17: LVEF 55-60 percent, akinesis of the basal inferior myocardium severe pulmonary artery hypertension. Potassium >4. Check magnesium.  Continue monitoring on telemetry. Consult cardiology.  18. Dementia: Moderate at least. Continue Aricept.  24. Adult failure to thrive:  Apparently has been declining for some time now.   DVT prophylaxis: Start IV heparin 07/30/16. Coumadin on hold since admission. Code Status: Full Family Communication: None at bedside Disposition Plan: DC back to SNF when medically stable.   Consultants:   Vascular surgery  Interventional radiology  Cardiology-pending  Procedures:   Therapeutic right thoracentesis on 07/28/16  Left great toe amputation site debridement by vascular surgery on 07/29/16  Antimicrobials:   None    Subjective: Poor historian. Denies complaints. Denies pain in right shoulder. No dyspnea or chest pain reported. As per RN, was hypoxic in the 70s this morning on 4 L/m nasal cannula oxygen but patient mouth breathing.  Objective:  Vitals:   07/29/16 1312 07/29/16 2311 07/30/16 0534 07/30/16 1051  BP: (!) 101/47 (!) 117/47 (!) 110/43 (!) 103/42  Pulse: 60 63 62 (!) 59  Resp:  (!) 21 20 20   Temp: 97.6 F (36.4 C) 97.7 F (36.5 C) 98 F (36.7 C) 98.4 F (36.9 C)  TempSrc: Axillary Axillary Axillary Axillary  SpO2: 99% 95% 98% 99%  Weight:   85.6 kg (188 lb 12.8 oz)   Height:        Intake/Output Summary (Last 24 hours) at 07/30/16 1219 Last data filed at 07/30/16 1049  Gross per 24 hour  Intake              110 ml  Output             1475 ml  Net            -1365 ml   Filed Weights   07/24/16 2234 07/25/16 0530 07/30/16 0534  Weight: 88.5 kg (195 lb) 89.2 kg (196 lb 9.6 oz) 85.6 kg (188 lb 12.8 oz)    Examination:  General exam: Pleasant elderly male, moderately built and frail, chronically ill looking, lying comfortably propped up in bed. Respiratory system: Clear to auscultation. Respiratory effort normal. Cardiovascular system: S1 & S2 heard, RRR. No JVD, murmurs, rubs, gallops or clicks. No pedal edema. Telemetry: V paced rhythm. Patient had a 7 and a 9 beat nonsustained VT in the last 24 hours. Gastrointestinal system: Abdomen is nondistended, soft and nontender. No organomegaly  or masses felt. Normal bowel sounds heard. Central nervous system: Alert and oriented 2. No focal neurological deficits. Extremities: Symmetric 5 x 5 power except right upper extremity which is immobilized in a sling.. Skin: No rashes, lesions or ulcers Psychiatry: Judgement and insight impaired. Mood & affect appropriate.     Data Reviewed: I have personally reviewed following labs and imaging studies  CBC:  Recent Labs Lab 07/24/16 2149 07/25/16 0550 07/25/16 1343 07/25/16 2046 07/26/16 0414 07/27/16 0150 07/28/16 0302  WBC 12.6* 10.8*  --  9.9 10.6* 10.4 11.8*  NEUTROABS 10.5*  --   --  6.9 8.1* 7.8* 9.4*  HGB 7.3* 8.4* 8.2* 8.3* 8.9* 8.3* 8.0*  HCT 23.1* 27.4* 27.0* 26.9* 29.1* 27.3* 26.2*  MCV 89.5 89.0  --  88.5 89.0 89.5 90.3  PLT 240 290  --  301 326 331 [id number]   Basic Metabolic Panel:  Recent Labs Lab 07/27/16 0150 07/27/16 0600 07/28/16 0302 07/29/16 0302 07/30/16 0825  NA 135 135 136 139 138  K 5.5* 5.5* 5.4* 4.9 4.3  CL 100* 99* 98* 97* 97*  CO2 29 30 32 34* 35*  GLUCOSE 135* 139* 164* 158*  158*  BUN 46* 48* 54* 48* 37*  CREATININE 1.21 1.19 1.11 1.08 0.91  CALCIUM 9.3 9.3 9.5 9.7 9.7   GFR: Estimated Creatinine Clearance: 68 mL/min (by C-G formula based on SCr of 0.91 mg/dL). Liver Function Tests:  Recent Labs Lab 07/24/16 2149  AST 50*  ALT 17  ALKPHOS 134*  BILITOT 0.4  PROT 6.3*  ALBUMIN 2.1*   No results for input(s): LIPASE, AMYLASE in the last 168 hours. No results for input(s): AMMONIA in the last 168 hours. Coagulation Profile:  Recent Labs Lab 07/26/16 1244 07/27/16 0600 07/27/16 1140 07/28/16 0302 07/30/16 0825  INR 2.60 2.44 2.36 1.54 1.30   Cardiac Enzymes: No results for input(s): CKTOTAL, CKMB, CKMBINDEX, TROPONINI in the last 168 hours. BNP (last 3 results) No results for input(s): PROBNP in the last 8760 hours. HbA1C: No results for input(s): HGBA1C in the last 72 hours. CBG:  Recent Labs Lab 07/29/16 1127  07/29/16 1619 07/29/16 2104 07/30/16 0811 07/30/16 1126  GLUCAP 171* 117* 116* 138* 178*   Lipid Profile: No results for input(s): CHOL, HDL, LDLCALC, TRIG, CHOLHDL, LDLDIRECT in the last 72 hours. Thyroid Function Tests: No results for input(s): TSH, T4TOTAL, FREET4, T3FREE, THYROIDAB in the last 72 hours. Anemia Panel: No results for input(s): VITAMINB12, FOLATE, FERRITIN, TIBC, IRON, RETICCTPCT in the last 72 hours.  Sepsis Labs:  Recent Labs Lab 07/25/16 2046 07/26/16 0414 07/27/16 0150 07/28/16 0302  WBC 9.9 10.6* 10.4 11.8*  LATICACIDVEN 0.6  --   --   --     Recent Results (from the past 240 hour(s))  MRSA PCR Screening     Status: None   Collection Time: 07/25/16  5:28 AM  Result Value Ref Range Status   MRSA by PCR NEGATIVE NEGATIVE Final    Comment:        The GeneXpert MRSA Assay (FDA approved for NASAL specimens only), is one component of a comprehensive MRSA colonization surveillance program. It is not intended to diagnose MRSA infection nor to guide or monitor treatment for MRSA infections.   Culture, body fluid-bottle     Status: None (Preliminary result)   Collection Time: 07/28/16  3:09 PM  Result Value Ref Range Status   Specimen Description FLUID RIGHT PLEURAL  Final   Special Requests BOTTLES DRAWN AEROBIC AND ANAEROBIC 10CC  Final   Culture NO GROWTH < 24 HOURS  Final   Report Status PENDING  Incomplete  Gram stain     Status: None   Collection Time: 07/28/16  3:09 PM  Result Value Ref Range Status   Specimen Description FLUID RIGHT PLEURAL  Final   Special Requests NONE  Final   Gram Stain   Final    RARE WBC PRESENT, PREDOMINANTLY PMN NO ORGANISMS SEEN    Report Status 07/28/2016 FINAL  Final         Radiology Studies: Dg Chest 1 View  Result Date: 07/28/2016 CLINICAL DATA:  82 year old male status post right chest ultrasound-guided thoracentesis today. Initial encounter. EXAM: CHEST 1 VIEW COMPARISON:  07/27/2016 and earlier.  FINDINGS: AP semi upright view at 1513 hours. Decreased right pleural effusion with no pneumothorax. Small volume residual including some pleural fluid in the minor fissure. Improved right mid and lower lung ventilation. Stable cardiomegaly and mediastinal contours. Stable left lung. IMPRESSION: Decreased right pleural effusion and increased right lung ventilation with no pneumothorax. Electronically Signed   By: Genevie Ann M.D.   On: 07/28/2016 15:32   US Thoracentesis Asp Pleural  Space W/img Guide  Result Date: 07/28/2016 INDICATION: Patient with hypoxic respiratory failure and right pleural effusion. Request is made for diagnostic and therapeutic thoracentesis. EXAM: ULTRASOUND GUIDED DIAGNOSTIC AND THERAPEUTIC RIGHT THORACENTESIS MEDICATIONS: 10 mL 1% lidocaine COMPLICATIONS: None immediate. PROCEDURE: An ultrasound guided thoracentesis was thoroughly discussed with the patient and questions answered. The benefits, risks, alternatives and complications were also discussed. The patient understands and wishes to proceed with the procedure. Written consent was obtained. Ultrasound was performed to localize and mark an adequate pocket of fluid in the right chest. The area was then prepped and draped in the normal sterile fashion. 1% Lidocaine was used for local anesthesia. Under ultrasound guidance a Safe-T-centesis catheter was introduced. Thoracentesis was performed. The catheter was removed and a dressing applied. FINDINGS: A total of approximately 2.1 liters of clear, yellow fluid was removed. Samples were sent to the laboratory as requested by the clinical team. IMPRESSION: Successful ultrasound guided diagnostic and therapeutic right thoracentesis yielding 2.1 liters of pleural fluid. Read by:  Brynda Greathouse PA-C Electronically Signed   By: Corrie Mckusick D.O.   On: 07/28/2016 15:34        Scheduled Meds: . aspirin EC  81 mg Oral Daily  . [START ON 07/31/2016]  ceFAZolin (ANCEF) IV  1 g Intravenous To  SS-Surg  . donepezil  10 mg Oral QHS  . ezetimibe-simvastatin  1 tablet Oral QHS  . furosemide  40 mg Oral Daily  . insulin aspart  0-9 Units Subcutaneous TID WC  . nystatin   Topical TID   Continuous Infusions:   LOS: 6 days        [facility], MD [facility] Pager [phone]  If 7PM-7AM, please contact night-coverage [email or web address] Password [email or web address] 07/30/2016, 12:18 PM

## 2016-07-30 NOTE — Progress Notes (Signed)
ANTICOAGULATION CONSULT NOTE - Initial Consult  Pharmacy Consult for Heparin Indication: atrial fibrillation  Allergies  Allergen Reactions  . Potassium-Containing Compounds Anaphylaxis    IV--loss of conciousness  . Metoprolol Succinate Other (See Comments)    Extremely tired    Patient Measurements: Height: 5\' 10"  (177.8 cm) Weight: 188 lb 12.8 oz (85.6 kg) IBW/kg (Calculated) : 73 Heparin Dosing Weight: 86  Vital Signs: Temp: 98.4 F (36.9 C) (02/07 1051) Temp Source: Axillary (02/07 1051) BP: 103/42 (02/07 1051) Pulse Rate: 59 (02/07 1051)  Labs:  Recent Labs  07/28/16 0302 07/29/16 0302 07/30/16 0825  HGB 8.0*  --   --   HCT 26.2*  --   --   PLT 308  --   --   LABPROT 18.6*  --  16.2*  INR 1.54  --  1.30  CREATININE 1.11 1.08 0.91    Estimated Creatinine Clearance: 68 mL/min (by C-G formula based on SCr of 0.91 mg/dL).   Medical History: Past Medical History:  Diagnosis Date  . Aortal stenosis 2006   biophrostetic  . Arthritis   . Atrial fibrillation ([place])    onset after CABG--- coumadin management at [facility] clinic  . Carotid artery disease ([place]) 09/27/2010   Carotid US 12/17: bilat ICA 40-59 >> 1 year FU  . CHF (congestive heart failure) ([place])    abter CABG  . Complete heart block ([place])   . Coronary artery disease   . DM type 2 (diabetes mellitus, type 2) ([place])    used to see Dr Meredith Pel, now f/u by Dr Larose Kells  . Erectile dysfunction   . Gangrene ([place])   . History of nephrolithiasis   . HTN (hypertension)   . Hyperlipidemia   . Memory loss   . Obesity   . OSA (obstructive sleep apnea)    not using CPAP  . Pacemaker 2/07   VVI  . PVD (peripheral vascular disease) ([name])    CT angio showed- vascular insuff, intestine and RAS bilaterally, andgiogram 02/2009 severe PVD medical management  . S/P aortic valve replacement   . Sick sinus syndrome ([name])    [name]  . Skin cancer    surgery (nose) 09-2010    Medications:  Scheduled:   . aspirin EC  81 mg Oral Daily  . [START ON 07/31/2016]  ceFAZolin (ANCEF) IV  1 g Intravenous To SS-Surg  . donepezil  10 mg Oral QHS  . ezetimibe-simvastatin  1 tablet Oral QHS  . furosemide  40 mg Oral Daily  . insulin aspart  0-9 Units Subcutaneous TID WC  . nystatin   Topical TID    Assessment: 82yo male on Coumadin pta, now to start Heparin bridge.  Pt to OR tomorrow at 0945 for debridement of great toe, amputation of 2nd toe, and placement of wound VAC.  Heparin is to be held x 6hr prior to OR.  Will give small heparin bolus as INR < 1.5. Goal of Therapy:  Heparin level 0.3-0.7 units/ml Monitor platelets by anticoagulation protocol: Yes   Plan:  Heparin 2000 units IV x 1, 1350 units/hr Check heparin level in 6hr Daily heparin level, CBC Heparin off at 0330 on 2/8.  Gracy Bruins, PharmD Clinical Pharmacist [facility]

## 2016-07-30 NOTE — Care Management Important Message (Signed)
Important Message  Patient Details  Name: [name]. MRN: [name] Date of Birth: [birth year]   Medicare Important Message Given:  Yes    [name] Abena 07/30/2016, 11:40 AM

## 2016-07-31 ENCOUNTER — Encounter (HOSPITAL_COMMUNITY)
Admission: EM | Disposition: A | Discharge status: Skilled Nursing Facility | Payer: Self-pay | Source: Home / Self Care | Attending: Internal Medicine

## 2016-07-31 ENCOUNTER — Inpatient Hospital Stay (HOSPITAL_COMMUNITY): Payer: Medicare Other | Admitting: Anesthesiology

## 2016-07-31 ENCOUNTER — Inpatient Hospital Stay (HOSPITAL_COMMUNITY): Payer: Medicare Other

## 2016-07-31 ENCOUNTER — Encounter (HOSPITAL_COMMUNITY): Payer: Self-pay | Admitting: Critical Care Medicine

## 2016-07-31 DIAGNOSIS — I482 Chronic atrial fibrillation: Secondary | ICD-10-CM

## 2016-07-31 DIAGNOSIS — I509 Heart failure, unspecified: Secondary | ICD-10-CM

## 2016-07-31 DIAGNOSIS — T8789 Other complications of amputation stump: Secondary | ICD-10-CM

## 2016-07-31 HISTORY — PX: AMPUTATION: SHX166

## 2016-07-31 HISTORY — PX: APPLICATION OF WOUND VAC: SHX5189

## 2016-07-31 HISTORY — PX: WOUND DEBRIDEMENT: SHX247

## 2016-07-31 LAB — ECHOCARDIOGRAM COMPLETE
AOPV: 0.51 m/s
AV Area VTI: 1.45 cm2
AV Area mean vel: 1.48 cm2
AV Mean grad: 14 mmHg
AV VEL mean LVOT/AV: 0.52
AV area mean vel ind: 0.72 cm2/m2
AV peak Index: 0.71
AV vel: 1.44
AVA: 1.44 cm2
AVAREAVTIIND: 0.7 cm2/m2
AVLVOTPG: 8 mmHg
AVPG: 29 mmHg
AVPKVEL: 268 cm/s
CHL CUP LVOT MV VTI INDEX: 0.66 cm2/m2
CHL CUP MV M VEL: 97.5
DOP CAL AO MEAN VELOCITY: 173 cm/s
E decel time: 250 msec
E/e' ratio: 37.45
FS: 24 % — AB (ref 28–44)
Height: 70 in
IVS/LV PW RATIO, ED: 1.37
LA diam end sys: 41 mm
LA vol: 84 mL
LADIAMINDEX: 2 cm/m2
LASIZE: 41 mm
LAVOLA4C: 68 mL
LAVOLIN: 41 mL/m2
LV E/e'average: 37.45
LV TDI E'LATERAL: 5.02
LV TDI E'MEDIAL: 3.55
LVEEMED: 37.45
LVELAT: 5.02 cm/s
LVOT MV VTI: 1.35
LVOT VTI: 29.1 cm
LVOT area: 2.84 cm2
LVOTD: 19 mm
LVOTPV: 137 cm/s
LVOTSV: 83 mL
LVOTVTI: 0.51 cm
MV Annulus VTI: 61 cm
MV Dec: 250
MV Peak grad: 14 mmHg
MV pk E vel: 188 m/s
Mean grad: 5 mmHg
PISA EROA: 0.09 cm2
PW: 15.1 mm — AB (ref 0.6–1.1)
RV sys press: 85 mmHg
Reg peak vel: 433 cm/s
TRMAXVEL: 433 cm/s
VTI: 129 cm
VTI: 57.2 cm
Valve area index: 0.7
Weight: 3068.8 oz

## 2016-07-31 LAB — HEPARIN LEVEL (UNFRACTIONATED): Heparin Unfractionated: 0.28 IU/mL — ABNORMAL LOW (ref 0.30–0.70)

## 2016-07-31 LAB — CBC
HEMATOCRIT: 24.8 % — AB (ref 39.0–52.0)
HEMOGLOBIN: 7.5 g/dL — AB (ref 13.0–17.0)
MCH: 27 pg (ref 26.0–34.0)
MCHC: 30.2 g/dL (ref 30.0–36.0)
MCV: 89.2 fL (ref 78.0–100.0)
Platelets: 361 10*3/uL (ref 150–400)
RBC: 2.78 MIL/uL — ABNORMAL LOW (ref 4.22–5.81)
RDW: 16.2 % — AB (ref 11.5–15.5)
WBC: 11.1 10*3/uL — ABNORMAL HIGH (ref 4.0–10.5)

## 2016-07-31 LAB — RETICULOCYTES
RBC.: 2.86 MIL/uL — ABNORMAL LOW (ref 4.22–5.81)
RETIC CT PCT: 6.4 % — AB (ref 0.4–3.1)
Retic Count, Absolute: 183 10*3/uL (ref 19.0–186.0)

## 2016-07-31 LAB — BASIC METABOLIC PANEL
Anion gap: 7 (ref 5–15)
BUN: 39 mg/dL — AB (ref 6–20)
CALCIUM: 9.3 mg/dL (ref 8.9–10.3)
CHLORIDE: 96 mmol/L — AB (ref 101–111)
CO2: 32 mmol/L (ref 22–32)
CREATININE: 0.88 mg/dL (ref 0.61–1.24)
GFR calc non Af Amer: >60 mL/min (ref >60)
GLUCOSE: 148 mg/dL — AB (ref 65–99)
Potassium: 4.4 mmol/L (ref 3.5–5.1)
Sodium: 135 mmol/L (ref 135–145)

## 2016-07-31 LAB — GLUCOSE, CAPILLARY
GLUCOSE-CAPILLARY: 138 mg/dL — AB (ref 65–99)
GLUCOSE-CAPILLARY: 151 mg/dL — AB (ref 65–99)
GLUCOSE-CAPILLARY: 120 mg/dL — AB (ref 65–99)
Glucose-Capillary: 159 mg/dL — ABNORMAL HIGH (ref 65–99)

## 2016-07-31 LAB — IRON AND TIBC
Iron: 17 ug/dL — ABNORMAL LOW (ref 45–182)
SATURATION RATIOS: 6 % — AB (ref 17.9–39.5)
TIBC: 300 ug/dL (ref 250–450)
UIBC: 283 ug/dL

## 2016-07-31 LAB — VITAMIN B12: Vitamin B-12: 745 pg/mL (ref 180–914)

## 2016-07-31 LAB — FERRITIN: FERRITIN: 59 ng/mL (ref 24–336)

## 2016-07-31 LAB — FOLATE: Folate: 9.9 ng/mL (ref >5.9)

## 2016-07-31 LAB — PREPARE RBC (CROSSMATCH)

## 2016-07-31 SURGERY — DEBRIDEMENT, WOUND
Anesthesia: Monitor Anesthesia Care | Laterality: Left

## 2016-07-31 MED ORDER — MEPIVACAINE HCL 1.5 % IJ SOLN
INTRAMUSCULAR | Status: DC | PRN
  Administered 2016-07-31: 30 mL via PERINEURAL

## 2016-07-31 MED ORDER — LACTATED RINGERS IV SOLN
INTRAVENOUS | Status: DC | PRN
  Administered 2016-07-31: 10:00:00 via INTRAVENOUS

## 2016-07-31 MED ORDER — LIDOCAINE HCL (PF) 1 % IJ SOLN
INTRAMUSCULAR | Status: AC
  Filled 2016-07-31: qty 30

## 2016-07-31 MED ORDER — 0.9 % SODIUM CHLORIDE (POUR BTL) OPTIME
TOPICAL | Status: DC | PRN
  Administered 2016-07-31: 1000 mL

## 2016-07-31 MED ORDER — SODIUM CHLORIDE 0.9 % IV SOLN
Freq: Once | INTRAVENOUS | Status: AC
  Administered 2016-07-31: 17:00:00 via INTRAVENOUS

## 2016-07-31 MED ORDER — PROPOFOL 10 MG/ML IV BOLUS
INTRAVENOUS | Status: AC
  Filled 2016-07-31: qty 20

## 2016-07-31 MED ORDER — CEFAZOLIN SODIUM-DEXTROSE 2-4 GM/100ML-% IV SOLN
INTRAVENOUS | Status: AC
  Filled 2016-07-31: qty 100

## 2016-07-31 MED ORDER — MIDAZOLAM HCL 2 MG/2ML IJ SOLN
INTRAMUSCULAR | Status: AC
  Filled 2016-07-31: qty 2

## 2016-07-31 MED ORDER — FUROSEMIDE 10 MG/ML IJ SOLN
20.0000 mg | Freq: Once | INTRAMUSCULAR | Status: AC
  Administered 2016-07-31: 20 mg via INTRAVENOUS
  Filled 2016-07-31: qty 2

## 2016-07-31 MED ORDER — FENTANYL CITRATE (PF) 100 MCG/2ML IJ SOLN
INTRAMUSCULAR | Status: AC
  Filled 2016-07-31: qty 2

## 2016-07-31 MED ORDER — FENTANYL CITRATE (PF) 100 MCG/2ML IJ SOLN: 25.0000 ug | INTRAMUSCULAR | Status: DC | PRN

## 2016-07-31 SURGICAL SUPPLY — 26 items
BANDAGE ACE 4X5 VEL STRL LF (GAUZE/BANDAGES/DRESSINGS) ×1 IMPLANT
BLADE SAW SGTL 81X20 HD (BLADE) IMPLANT
BNDG CONFORM 3 STRL LF (GAUZE/BANDAGES/DRESSINGS) IMPLANT
BNDG GAUZE ELAST 4 BULKY (GAUZE/BANDAGES/DRESSINGS) ×1 IMPLANT
CANISTER SUCTION 2500CC (MISCELLANEOUS) ×3 IMPLANT
COVER SURGICAL LIGHT HANDLE (MISCELLANEOUS) ×3 IMPLANT
DRAPE HALF SHEET 40X57 (DRAPES) ×1 IMPLANT
DRSG VAC ATS SM SENSATRAC (GAUZE/BANDAGES/DRESSINGS) ×2 IMPLANT
ELECT REM PT RETURN 9FT ADLT (ELECTROSURGICAL) ×3
ELECTRODE REM PT RTRN 9FT ADLT (ELECTROSURGICAL) ×1 IMPLANT
GAUZE SPONGE 4X4 12PLY STRL (GAUZE/BANDAGES/DRESSINGS) ×1 IMPLANT
GLOVE BIOGEL PI IND STRL 7.5 (GLOVE) ×1 IMPLANT
GLOVE BIOGEL PI INDICATOR 7.5 (GLOVE) ×2
GLOVE SURG SS PI 7.5 STRL IVOR (GLOVE) ×3 IMPLANT
GOWN STRL REUS W/ TWL LRG LVL3 (GOWN DISPOSABLE) ×2 IMPLANT
GOWN STRL REUS W/TWL LRG LVL3 (GOWN DISPOSABLE) ×6
KIT BASIN OR (CUSTOM PROCEDURE TRAY) ×3 IMPLANT
KIT ROOM TURNOVER OR (KITS) ×3 IMPLANT
NS IRRIG 1000ML POUR BTL (IV SOLUTION) ×3 IMPLANT
PACK ORTHO EXTREMITY (CUSTOM PROCEDURE TRAY) ×2 IMPLANT
PAD ARMBOARD 7.5X6 YLW CONV (MISCELLANEOUS) ×6 IMPLANT
PAD NEG PRESSURE SENSATRAC (MISCELLANEOUS) ×2 IMPLANT
SUT ETHILON 3 0 PS 1 (SUTURE) ×3 IMPLANT
TOWEL OR 17X24 6PK STRL BLUE (TOWEL DISPOSABLE) ×3 IMPLANT
TOWEL OR 17X26 10 PK STRL BLUE (TOWEL DISPOSABLE) ×3 IMPLANT
UNDERPAD 30X30 (UNDERPADS AND DIAPERS) ×3 IMPLANT

## 2016-07-31 NOTE — Progress Notes (Signed)
CCMD notified RN that pt had a run 8 beat run of Vtach then shortly after another 4 beat run. RN checked on Pt and was asymptomatic. Will continue to monitor.

## 2016-07-31 NOTE — H&P (View-Only) (Signed)
   VASCULAR SURGERY ASSESSMENT & PLAN:  I did some excisional debridement of the left great toe amputation site last night but given that the bone is exposed, he will need further work on the left foot. I have discussed this with Dr. Trula Slade who will proceed with further debridement of the left foot and possible placement of a negative pressure dressing. This has been scheduled for tomorrow.  His Coumadin has been on hold. INR 2 days ago was 1.54. Check INR today. If OK could give small dose of Coumadin tonight so that this does not delay his D/C further.   SUBJECTIVE: No complaints.  PHYSICAL EXAM: Vitals:   07/29/16 1200 07/29/16 1312 07/29/16 2311 07/30/16 0534  BP: (!) 120/54 (!) 101/47 (!) 117/47 (!) 110/43  Pulse: (!) 59 60 63 62  Resp: 18  (!) 21 20  Temp:  97.6 F (36.4 C) 97.7 F (36.5 C) 98 F (36.7 C)  TempSrc:  Axillary Axillary Axillary  SpO2: 98% 99% 95% 98%  Weight:    188 lb 12.8 oz (85.6 kg)  Height:       Dressing with minimal drainage.  LABS: Lab Results  Component Value Date   WBC 11.8 (H) 07/28/2016   HGB 8.0 (L) 07/28/2016   HCT 26.2 (L) 07/28/2016   MCV 90.3 07/28/2016   PLT 308 07/28/2016   Lab Results  Component Value Date   CREATININE 1.08 07/29/2016   Lab Results  Component Value Date   INR 1.54 07/28/2016   PROTIME 16.2 12/05/2008   CBG (last 3)   Recent Labs  07/29/16 1127 07/29/16 1619 07/29/16 2104  GLUCAP 171* 117* 116*    Principal Problem:   Right humeral fracture Active Problems:   DM (diabetes mellitus) type II uncontrolled, periph vascular disorder ([name])   S/P AVR (aortic valve replacement)   Chronic atrial fibrillation ([name])   Chronic diastolic heart failure ([name])   Pleural effusion   Anemia   Supratherapeutic INR   Chronic respiratory failure ([place])   Fall   Alzheimer's dementia   Fracture of humerus neck, right, closed, initial encounter    Gae Gallop Beeper: [id number] 07/30/2016

## 2016-07-31 NOTE — Transfer of Care (Signed)
Immediate Anesthesia Transfer of Care Note  Patient: [name].  Procedure(s) Performed: Procedure(s): DEBRIDEMENT GREAT TOE AMPUTATION SITE (Left) SECOND TOE AMPUTATION (Left) APPLICATION OF WOUND VAC (Left)  Patient Location: PACU  Anesthesia Type:MAC and Regional  Level of Consciousness: awake, alert  and oriented  Airway & Oxygen Therapy: Patient Spontanous Breathing and Patient connected to nasal cannula oxygen  Post-op Assessment: Report given to RN and Post -op Vital signs reviewed and stable  Post vital signs: Reviewed and stable  Last Vitals:  Vitals:   07/30/16 2101 07/31/16 0418  BP:  (!) 106/45  Pulse:  (!) 59  Resp: 20 19  Temp: 36.5 C 36.7 C    Last Pain:  Vitals:   07/31/16 0418  TempSrc: Axillary  PainSc:          Complications: No apparent anesthesia complications

## 2016-07-31 NOTE — Anesthesia Procedure Notes (Signed)
Anesthesia Regional Block:  Popliteal block  Pre-Anesthetic Checklist: ,, timeout performed, Correct Patient, Correct Site, Correct Laterality, Correct Procedure, Correct Position, site marked, Risks and benefits discussed,  Surgical consent,  Pre-op evaluation,  At surgeon's request and post-op pain management  Laterality: Left  Prep: chloraprep       Needles:  Injection technique: Single-shot  Needle Type: Echogenic Needle     Needle Length: 9cm 9 cm Needle Gauge: 21 and 21 G    Additional Needles:  Procedures: ultrasound guided (picture in chart) Popliteal block Narrative:  Start time: 07/31/2016 9:50 AM End time: 07/31/2016 9:56 AM Injection made incrementally with aspirations every 5 mL.  Performed by: Personally  Anesthesiologist: Suzette Battiest

## 2016-07-31 NOTE — Progress Notes (Addendum)
PROGRESS NOTE  [name].  [id number] DOB: [birth year]  DOA: 07/24/2016 PCP: [name], MD   Brief Narrative:  82 year old male with PMH of severe AS s/p redo AVR (bioprosthetic valve), chronic diastolic CHF, CAD status post CABG, complete heart block s/p PPM, A. fib on chronic Coumadin, history of left atrial thrombus by TEE 7/12, PAD, COPD on home oxygen 2 L/m, type II DM, HTN, HLD, OSA not on CPAP, left foot osteomyelitis status post amputation of great toe, Alzheimer's dementia, recent hospitalization 1/11-1/29 for acute respiratory failure with hypoxia, noted to have a large right-sided pleural effusion for which she underwent thoracentesis, diabetic foot ulcer with osteomyelitis treated with antibiotics and then discharged to [facility], presented back to the ED following a fall on day of admission, found on floor. Also noted more lethargic than usual and worsening short-term memory. X-rays confirmed right humeral neck fracture. Hemoglobin 7.3. Vascular surgery consulted for left foot wound management-underwent I&D of left great toe on 2/6 and 2/8. Cardiology consulted for NSVT.   Assessment & Plan:   Principal Problem:   Right humeral fracture Active Problems:   DM (diabetes mellitus) type II uncontrolled, periph vascular disorder ([name])   S/P AVR (aortic valve replacement)   Chronic atrial fibrillation ([name])   Chronic diastolic heart failure ([name])   Pleural effusion   Anemia   Supratherapeutic INR   Chronic respiratory failure ([place])   Fall   Alzheimer's dementia   Fracture of humerus neck, right, closed, initial encounter   1. Right humeral neck fracture: Sustained status post fall at SNF. X-ray of right shoulder 07/24/16 confirmed displaced mildly comminuted humeral neck fracture and no evidence of intra-articular extension. Continue right shoulder sling. Plan to discuss with orthopedics and likely outpatient follow-up.  2. Acute on chronic anemia:  Hemoglobin 12 g in December 2017 but has progressively dropped and was 7.3 on admission. No overt bleeding reported. Status post 1 unit PRBC transfusion. Hemoglobin 8 g per DL on 2/5 but has been gradually drifting again since 2/3. Hemoglobin 7.5 on 2/8. Likely to drop further. Transfuse 1 unit PRBC and follow CBC in a.m. Check stool for occult blood and anemia panel.  3. Chronic A. fib: Presented with supratherapeutic INR of 3.6. Received vitamin K 1 mg on admission. Coumadin held. CHA2DS2VASc score is 6. INR 1.3. Discussed with vascular surgery/Dr. Doren Custard on 2/7 >Ok with IV heparin infusion given prior history of left atrial thrombus. Heparin drip was held 6 hours prior to procedure. Vpaced rhythm on monitor. Resume IV heparin bridging and Coumadin when okay with vascular surgery. Metoprolol started by cardiology for NSVT which should help A. fib too.  4. Right-sided pleural effusion, recurrent: Status post 2.1 L thoracentesis on 2/5 with improved follow-up chest x-ray. Transudative. X-ray 07/30/16: Poor film. Reports cardiomegaly and mild pulmonary edema. Lung bases poorly defined due to body habitus and overlying soft tissue. Saturating in the high 90s on 3 L/m nasal cannula oxygen.  5. Leukocytosis: Mild and stable.  6. Hypotension: Initial low blood pressures of 93/48. Antihypertensives held. Improved.  7. Chronic hypoxic respiratory failure: On home oxygen 2 L/m.  8. Stage III chronic kidney disease: Stable.  9. Mild hyperkalemia: Resolved.  10. Chronic diastolic CHF: Clinically euvolemic. Continue oral Lasix.  11. CAD status post CABG: Asymptomatic of chest pain.  12. Complete heart block status post PPM: Monitor on telemetry which also shows episodes of NSVT.   13. Status post bioprosthetic AVR:  14.  Type II DM: Reasonable inpatient control on SSI.  15. Essential hypertension: Management as above.  73. S/P angioplasty and stenting LLE and left great toe amputation: As per vascular  surgery follow-up, underwent excisional debridement of left great toe amputation site on 2/6 night and repeat I&D in all OR on 2/8 followed by negative pressure dressing.  17. Nonsustained VT: 2-D echo of/12/17: LVEF 55-60 percent, akinesis of the basal inferior myocardium severe pulmonary artery hypertension. Potassium >4. Magnesium 1.9 (was replaced 2/7). Continue monitoring on telemetry. Cardiology consultation appreciated. Started on metoprolol. 2-D echo 07/31/16: LVEF 55-60 percent, normal wall motion without regional wall motion abnormalities and severe pulmonary hypertension.  18. Dementia: Moderate at least. Continue Aricept.  69. Adult failure to thrive: Apparently has been declining for some time now.   DVT prophylaxis: Started IV heparin 07/30/16. Coumadin on hold since admission. Code Status: Full Family Communication: Discussed in detail with patient's spouse. Updated care and answered questions. Disposition Plan: DC back to SNF when medically stable.   Consultants:   Vascular surgery  Interventional radiology  Cardiology-pending  Procedures:   Therapeutic right thoracentesis on 07/28/16  Left great toe amputation site debridement by vascular surgery on 07/29/16  I&D left great toe 07/31/16  2-D echo 07/31/16: Study Conclusions  - Left ventricle: The cavity size was normal. There was moderate   concentric hypertrophy. Systolic function was normal. The   estimated ejection fraction was in the range of 55% to 60%. Wall   motion was normal; there were no regional wall motion   abnormalities. The study is not technically sufficient to allow   evaluation of LV diastolic function. - Ventricular septum: Septal motion showed paradox. These changes   are consistent with RV pressure overload. - Aortic valve: A bioprosthesis was present and functioning   normally. Valve area (VTI): 1.44 cm^2. Valve area (Vmax): 1.45   cm^2. Valve area (Vmean): 1.48 cm^2. - Mitral valve: Severely  calcified annulus. The findings are   consistent with mild stenosis. There was mild to moderate   regurgitation directed centrally. Valve area by continuity   equation (using LVOT flow): 1.35 cm^2. - Left atrium: The atrium was moderately to severely dilated. - Right ventricle: The cavity size was moderately dilated. Systolic   function was mildly to moderately reduced. - Tricuspid valve: There was moderate regurgitation directed   centrally. - Pulmonary arteries: Systolic pressure was severely increased. PA   peak pressure: 83 mm Hg (S).   Antimicrobials:   None    Subjective: Seen this morning prior to procedure. Denied complaints. Denied pain, chest pain or dyspnea. No bleeding reported. As per RN, no acute issues.  Objective:  Vitals:   07/31/16 1109 07/31/16 1124 07/31/16 1139 07/31/16 1200  BP: (!) 117/45 (!) 110/42 (!) 114/46 (!) 117/47  Pulse: 62 (!) 59 (!) 55 62  Resp: (!) 25 18 20  (!) 26  Temp: 97 F (36.1 C)     TempSrc:      SpO2: 97% 99% 98% 98%  Weight:      Height:        Intake/Output Summary (Last 24 hours) at 07/31/16 1431 Last data filed at 07/31/16 1348  Gross per 24 hour  Intake           682.78 ml  Output             1510 ml  Net          -827.22 ml   [employer]   07/25/16  0530 07/30/16 0534 07/31/16 0418  Weight: 89.2 kg (196 lb 9.6 oz) 85.6 kg (188 lb 12.8 oz) 87 kg (191 lb 12.8 oz)    Examination:  General exam: Pleasant elderly male, moderately built and frail, chronically ill looking, lying comfortably propped up in bed. Respiratory system: Diminished breath sounds in the bases but otherwise clear to auscultation. Respiratory effort normal. Cardiovascular system: S1 & S2 heard, RRR. No JVD, murmurs, rubs, gallops or clicks. No pedal edema. Telemetry: V paced rhythm. Patient had a 11 beat NSVT on afternoon of 07/30/16. Gastrointestinal system: Abdomen is nondistended, soft and nontender. No organomegaly or masses felt. Normal bowel  sounds heard. Central nervous system: Alert and oriented 2. No focal neurological deficits. Extremities: Symmetric 5 x 5 power except right upper extremity which is immobilized in a sling.. Skin: Left foot dressing clean and dry Psychiatry: Judgement and insight impaired. Mood & affect appropriate.     Data Reviewed: I have personally reviewed following labs and imaging studies  CBC:  Recent Labs Lab 07/24/16 2149  07/25/16 2046 07/26/16 0414 07/27/16 0150 07/28/16 0302 07/30/16 1352 07/31/16 0400  WBC 12.6*  < > 9.9 10.6* 10.4 11.8* 9.9 11.1*  NEUTROABS 10.5*  --  6.9 8.1* 7.8* 9.4*  --   --   HGB 7.3*  < > 8.3* 8.9* 8.3* 8.0* 8.2* 7.5*  HCT 23.1*  < > 26.9* 29.1* 27.3* 26.2* 27.7* 24.8*  MCV 89.5  < > 88.5 89.0 89.5 90.3 90.2 89.2  PLT 240  < > 301 326 331 308 368 361  < > = values in this interval not displayed. Basic Metabolic Panel:  Recent Labs Lab 07/27/16 0600 07/28/16 0302 07/29/16 0302 07/30/16 0825 07/30/16 1352 07/31/16 0400  NA 135 136 139 138  --  135  K 5.5* 5.4* 4.9 4.3  --  4.4  CL 99* 98* 97* 97*  --  96*  CO2 30 32 34* 35*  --  32  GLUCOSE 139* 164* 158* 158*  --  148*  BUN 48* 54* 48* 37*  --  39*  CREATININE 1.19 1.11 1.08 0.91  --  0.88  CALCIUM 9.3 9.5 9.7 9.7  --  9.3  MG  --   --   --   --  1.9  --    GFR: Estimated Creatinine Clearance: 70.3 mL/min (by C-G formula based on SCr of 0.88 mg/dL). Liver Function Tests:  Recent Labs Lab 07/24/16 2149  AST 50*  ALT 17  ALKPHOS 134*  BILITOT 0.4  PROT 6.3*  ALBUMIN 2.1*   No results for input(s): LIPASE, AMYLASE in the last 168 hours. No results for input(s): AMMONIA in the last 168 hours. Coagulation Profile:  Recent Labs Lab 07/26/16 1244 07/27/16 0600 07/27/16 1140 07/28/16 0302 07/30/16 0825  INR 2.60 2.44 2.36 1.54 1.30   Cardiac Enzymes: No results for input(s): CKTOTAL, CKMB, CKMBINDEX, TROPONINI in the last 168 hours. BNP (last 3 results) No results for input(s):  PROBNP in the last 8760 hours. HbA1C: No results for input(s): HGBA1C in the last 72 hours. CBG:  Recent Labs Lab 07/30/16 1126 07/30/16 1704 07/30/16 2101 07/31/16 0741 07/31/16 1117  GLUCAP 178* 136* 121* 138* 151*   Lipid Profile: No results for input(s): CHOL, HDL, LDLCALC, TRIG, CHOLHDL, LDLDIRECT in the last 72 hours. Thyroid Function Tests: No results for input(s): TSH, T4TOTAL, FREET4, T3FREE, THYROIDAB in the last 72 hours. Anemia Panel: No results for input(s): VITAMINB12, FOLATE, FERRITIN, TIBC, IRON, RETICCTPCT in the  last 72 hours.  Sepsis Labs:  Recent Labs Lab 07/25/16 2046  07/27/16 0150 07/28/16 0302 07/30/16 1352 07/31/16 0400  WBC 9.9  < > 10.4 11.8* 9.9 11.1*  LATICACIDVEN 0.6  --   --   --   --   --   < > = values in this interval not displayed.  Recent Results (from the past 240 hour(s))  MRSA PCR Screening     Status: None   Collection Time: 07/25/16  5:28 AM  Result Value Ref Range Status   MRSA by PCR NEGATIVE NEGATIVE Final    Comment:        The GeneXpert MRSA Assay (FDA approved for NASAL specimens only), is one component of a comprehensive MRSA colonization surveillance program. It is not intended to diagnose MRSA infection nor to guide or monitor treatment for MRSA infections.   Culture, body fluid-bottle     Status: None (Preliminary result)   Collection Time: 07/28/16  3:09 PM  Result Value Ref Range Status   Specimen Description FLUID RIGHT PLEURAL  Final   Special Requests BOTTLES DRAWN AEROBIC AND ANAEROBIC 10CC  Final   Culture NO GROWTH 3 DAYS  Final   Report Status PENDING  Incomplete  Gram stain     Status: None   Collection Time: 07/28/16  3:09 PM  Result Value Ref Range Status   Specimen Description FLUID RIGHT PLEURAL  Final   Special Requests NONE  Final   Gram Stain   Final    RARE WBC PRESENT, PREDOMINANTLY PMN NO ORGANISMS SEEN    Report Status 07/28/2016 FINAL  Final         Radiology Studies: Dg  Chest Port 1 View  Result Date: 07/30/2016 CLINICAL DATA:  Recurrent RIGHT pleural effusion. EXAM: PORTABLE CHEST 1 VIEW COMPARISON:  07/28/2016 FINDINGS: Sternotomy wires overlie normal cardiac silhouette. Lung bases are poorly evaluated. Fine pulmonary edema pattern similar to comparison exam. No pneumothorax. IMPRESSION: Cardiomegaly and mild pulmonary edema.  No significant change. Lung bases are poorly defined to body habitus and overlying soft tissue Electronically Signed   By: Suzy Bouchard M.D.   On: 07/30/2016 13:59        Scheduled Meds: . aspirin EC  81 mg Oral Daily  . ceFAZolin      . donepezil  10 mg Oral QHS  . ezetimibe-simvastatin  1 tablet Oral QHS  . furosemide  40 mg Oral Daily  . insulin aspart  0-9 Units Subcutaneous TID WC  . metoprolol tartrate  12.5 mg Oral BID  . nystatin   Topical TID   Continuous Infusions:   LOS: 7 days        [facility], MD [facility] Pager [phone]  If 7PM-7AM, please contact night-coverage [email or web address] Password [facility] 07/31/2016, 2:31 PM

## 2016-07-31 NOTE — Progress Notes (Signed)
  Echocardiogram 2D Echocardiogram has been performed.  [name] 07/31/2016, 1:04 PM

## 2016-07-31 NOTE — Anesthesia Preprocedure Evaluation (Signed)
Anesthesia Evaluation  Patient identified by MRN, date of birth, ID band Patient awake    Reviewed: Allergy & Precautions, H&P , NPO status , Patient's Chart, lab work & pertinent test results  Airway Mallampati: II  TM Distance: >3 FB Neck ROM: full    Dental  (+) Dental Advisory Given   Pulmonary sleep apnea , former smoker,    breath sounds clear to auscultation       Cardiovascular hypertension, Pt. on medications + CAD, + Past MI, + CABG, + Peripheral Vascular Disease and +CHF  + dysrhythmias + pacemaker + Valvular Problems/Murmurs (s/p AVR)  Rhythm:regular Rate:Normal  S/p AVR (bioprosthetic)   Neuro/Psych    GI/Hepatic   Endo/Other  diabetes, Type 2  Renal/GU Renal InsufficiencyRenal disease     Musculoskeletal  (+) Arthritis ,   Abdominal   Peds  Hematology   Anesthesia Other Findings   Reproductive/Obstetrics                             Lab Results  Component Value Date   WBC 11.1 (H) 07/31/2016   HGB 7.5 (L) 07/31/2016   HCT 24.8 (L) 07/31/2016   MCV 89.2 07/31/2016   PLT 361 07/31/2016   Lab Results  Component Value Date   CREATININE 0.88 07/31/2016   BUN 39 (H) 07/31/2016   NA 135 07/31/2016   K 4.4 07/31/2016   CL 96 (L) 07/31/2016   CO2 32 07/31/2016    Anesthesia Physical  Anesthesia Plan  ASA: III  Anesthesia Plan: MAC and Regional   Post-op Pain Management:    Induction: Intravenous  Airway Management Planned: Natural Airway and Simple Face Mask  Additional Equipment:   Intra-op Plan:   Post-operative Plan:   Informed Consent: I have reviewed the patients History and Physical, chart, labs and discussed the procedure including the risks, benefits and alternatives for the proposed anesthesia with the patient or authorized representative who has indicated his/her understanding and acceptance.     Plan Discussed with: CRNA  Anesthesia Plan  Comments:         Anesthesia Quick Evaluation

## 2016-07-31 NOTE — Anesthesia Postprocedure Evaluation (Signed)
Anesthesia Post Note  Patient: [name].  Procedure(s) Performed: Procedure(s) (LRB): DEBRIDEMENT GREAT TOE AMPUTATION SITE (Left) SECOND TOE AMPUTATION (Left) APPLICATION OF WOUND VAC (Left)  Patient location during evaluation: PACU Anesthesia Type: Regional and MAC Level of consciousness: awake and alert Pain management: pain level controlled Vital Signs Assessment: post-procedure vital signs reviewed and stable Respiratory status: spontaneous breathing, nonlabored ventilation, respiratory function stable and patient connected to nasal cannula oxygen Cardiovascular status: stable and blood pressure returned to baseline Anesthetic complications: no       Last Vitals:  Vitals:   07/31/16 1139 07/31/16 1200  BP: (!) 114/46 (!) 117/47  Pulse: (!) 55 62  Resp: 20 (!) 26  Temp:      Last Pain:  Vitals:   07/31/16 1140  TempSrc:   PainSc: 0-No pain                 Tiajuana Amass

## 2016-07-31 NOTE — Op Note (Signed)
    Patient name: [name]. MRN: [id number] DOB: September 04, 1934 Sex: male  07/24/2016 - 07/31/2016 Pre-operative Diagnosis: non-healing left great toe amputation site Post-operative diagnosis:  Same Surgeon:  Annamarie Major Assistants:  none Procedure:   I&D left great toe, including skin, soft tissue and bone   #2  Application of wound vac Anesthesia:  regional Blood Loss:  See anesthesia record Specimens:  none  Findings:  Good capillary bleeding  Indications:  The patient is s/p percutaneous revascularization and toe amputation with a non healing wound.  He comes in today for debridement  Procedure:  The patient was identified in the holding area and taken to [place]  The patient was then placed supine on the table. regional anesthesia was administered.  The patient was prepped and draped in the usual sterile fashion.  A time out was called and antibiotics were administered.  A #10 blade was used to remove non-viable skin.  A rongeur was then used to remove soft bone back to the metatarsal bones.  I then debrided all non-viable tissue.  There was good capillary bleeding.  A negative pressure dressing was then applied.   Disposition:  To PACU in stable condition.   Theotis Burrow, M.D. [facility] Office: [phone] Pager:  [phone]

## 2016-07-31 NOTE — Interval H&P Note (Signed)
History and Physical Interval Note:  07/31/2016 10:11 AM  [name].  has presented today for surgery, with the diagnosis of Nonhealing left great toe amputation site T81.89XA  The various methods of treatment have been discussed with the patient and family. After consideration of risks, benefits and other options for treatment, the patient has consented to  Procedure(s): DEBRIDEMENT GREAT TOE AMPUTATION SITE (Left) SECOND TOE AMPUTATION (Left) APPLICATION OF WOUND VAC (Left) as a surgical intervention .  The patient's history has been reviewed, patient examined, no change in status, stable for surgery.  I have reviewed the patient's chart and labs.  Questions were answered to the patient's satisfaction.     Annamarie Major  Patient sedated.  Wound examined.  Will plan to debride left greaat toe  WB

## 2016-08-01 ENCOUNTER — Encounter (HOSPITAL_COMMUNITY): Payer: Self-pay | Admitting: Surgery

## 2016-08-01 DIAGNOSIS — I4729 Other ventricular tachycardia: Secondary | ICD-10-CM

## 2016-08-01 DIAGNOSIS — I472 Ventricular tachycardia: Secondary | ICD-10-CM

## 2016-08-01 LAB — BASIC METABOLIC PANEL
Anion gap: 9 (ref 5–15)
BUN: 35 mg/dL — AB (ref 6–20)
CALCIUM: 9.5 mg/dL (ref 8.9–10.3)
CO2: 31 mmol/L (ref 22–32)
Chloride: 96 mmol/L — ABNORMAL LOW (ref 101–111)
Creatinine, Ser: 0.98 mg/dL (ref 0.61–1.24)
GFR calc Af Amer: >60 mL/min (ref >60)
GLUCOSE: 154 mg/dL — AB (ref 65–99)
Potassium: 4.5 mmol/L (ref 3.5–5.1)
SODIUM: 136 mmol/L (ref 135–145)

## 2016-08-01 LAB — CBC
HCT: 26.7 % — ABNORMAL LOW (ref 39.0–52.0)
Hemoglobin: 8.2 g/dL — ABNORMAL LOW (ref 13.0–17.0)
MCH: 27 pg (ref 26.0–34.0)
MCHC: 30.7 g/dL (ref 30.0–36.0)
MCV: 87.8 fL (ref 78.0–100.0)
PLATELETS: 340 10*3/uL (ref 150–400)
RBC: 3.04 MIL/uL — ABNORMAL LOW (ref 4.22–5.81)
RDW: 15.8 % — AB (ref 11.5–15.5)
WBC: 12.4 10*3/uL — ABNORMAL HIGH (ref 4.0–10.5)

## 2016-08-01 LAB — GLUCOSE, CAPILLARY
GLUCOSE-CAPILLARY: 112 mg/dL — AB (ref 65–99)
Glucose-Capillary: 152 mg/dL — ABNORMAL HIGH (ref 65–99)
Glucose-Capillary: 178 mg/dL — ABNORMAL HIGH (ref 65–99)
Glucose-Capillary: 136 mg/dL — ABNORMAL HIGH (ref 65–99)
Glucose-Capillary: 202 mg/dL — ABNORMAL HIGH (ref 65–99)

## 2016-08-01 LAB — TYPE AND SCREEN
BLOOD PRODUCT EXPIRATION DATE: 201803092359
ISSUE DATE / TIME: 201802081634
Unit Type and Rh: 7300

## 2016-08-01 LAB — HEPARIN LEVEL (UNFRACTIONATED)
Heparin Unfractionated: <0.1 IU/mL — ABNORMAL LOW (ref 0.30–0.70)
Heparin Unfractionated: 0.33 IU/mL (ref 0.30–0.70)

## 2016-08-01 MED ORDER — FERROUS SULFATE 325 (65 FE) MG PO TABS
325.0000 mg | ORAL_TABLET | Freq: Two times a day (BID) | ORAL | Status: DC
  Administered 2016-08-01 – 2016-08-09 (×15): 325 mg via ORAL
  Filled 2016-08-01 (×15): qty 1

## 2016-08-01 MED ORDER — HEPARIN (PORCINE) IN NACL 100-0.45 UNIT/ML-% IJ SOLN
1200.0000 [IU]/h | INTRAMUSCULAR | Status: DC
  Administered 2016-08-01 – 2016-08-04 (×3): 1200 [IU]/h via INTRAVENOUS
  Filled 2016-08-01 (×4): qty 250

## 2016-08-01 MED ORDER — DOCUSATE SODIUM 100 MG PO CAPS
100.0000 mg | ORAL_CAPSULE | Freq: Two times a day (BID) | ORAL | Status: DC
  Administered 2016-08-01 – 2016-08-09 (×16): 100 mg via ORAL
  Filled 2016-08-01 (×15): qty 1

## 2016-08-01 MED ORDER — WARFARIN - PHARMACIST DOSING INPATIENT: Freq: Every day | Status: DC

## 2016-08-01 MED ORDER — WARFARIN SODIUM 5 MG PO TABS
5.0000 mg | ORAL_TABLET | Freq: Once | ORAL | Status: AC
  Administered 2016-08-01: 5 mg via ORAL
  Filled 2016-08-01: qty 1

## 2016-08-01 NOTE — Progress Notes (Signed)
Had offered  And encouraged pt to eat his lunch tray and dinner tray multiple times but pt has indicated he does not want to eat.

## 2016-08-01 NOTE — Progress Notes (Addendum)
PROGRESS NOTE  [name].  UQ:[id number] DOB: [birth year]  DOA: 07/24/2016 PCP: [name], MD   Brief Narrative:  82 year old male with PMH of severe AS s/p redo AVR (bioprosthetic valve), chronic diastolic CHF, CAD status post CABG, complete heart block s/p PPM, A. fib on chronic Coumadin, history of left atrial thrombus by TEE 7/12, PAD, COPD on home oxygen 2 L/m, type II DM, HTN, HLD, OSA not on CPAP, left foot osteomyelitis status post amputation of great toe, Alzheimer's dementia, recent hospitalization 1/11-1/29 for acute respiratory failure with hypoxia, noted to have a large right-sided pleural effusion for which she underwent thoracentesis, diabetic foot ulcer with osteomyelitis treated with antibiotics and then discharged to [facility] for 3 days, presented back to the ED following a fall on day of admission, found on floor. Also noted more lethargic than usual and worsening short-term memory. X-rays confirmed right humeral neck fracture. Hemoglobin 7.3. Vascular surgery consulted for left foot wound management-underwent I&D of left great toe on 2/6 and 2/8. Cardiology consulted for NSVT.   Assessment & Plan:   Principal Problem:   Right humeral fracture Active Problems:   DM (diabetes mellitus) type II uncontrolled, periph vascular disorder ([name])   S/P AVR (aortic valve replacement)   Chronic atrial fibrillation ([name])   Chronic diastolic heart failure ([name])   Pleural effusion   Anemia   Supratherapeutic INR   Chronic respiratory failure ([place])   Fall   Alzheimer's dementia   Fracture of humerus neck, right, closed, initial encounter   1. Right humeral neck fracture: Sustained status post fall at SNF. X-ray of right shoulder 07/24/16 confirmed displaced mildly comminuted humeral neck fracture and no evidence of intra-articular extension. Continue right shoulder sling. Plan to discuss with orthopedics and likely outpatient follow-up.  2. Acute on chronic anemia:  Hemoglobin 12 g in December 2017 but has progressively dropped and was 7.3 on admission. No overt bleeding reported. Status post 1 unit PRBC transfusion. Hemoglobin 8 g per DL on 2/5 but has been gradually drifting again since 2/3. Hemoglobin 7.5 on 2/8. Transfused additional 1 unit PRBC 2/8. Hemoglobin up to 8.2. Anemia panel reviewed: Iron 17, TIBC 300, saturation ratio 6, ferritin 59, folate 9.9, B12: 745 and reticulocytes 183. Picture suggestive of iron deficiency. Start iron supplements. Consider outpatient GI evaluation/screening colonoscopy if not done.  3. Chronic A. fib: Presented with supratherapeutic INR of 3.6. Received vitamin K 1 mg on admission. Coumadin held. CHA2DS2VASc score is 6. INR 1.3. Metoprolol started by cardiology for NSVT which should help A. fib too. IV heparin bridging resumed and Coumadin per pharmacy.  4. Right-sided pleural effusion, recurrent: Status post 2.1 L thoracentesis on 2/5 with improved follow-up chest x-ray. Transudative. X-ray 07/30/16: Poor film. Reports cardiomegaly and mild pulmonary edema. Lung bases poorly defined due to body habitus and overlying soft tissue. Saturating in the high 90s on 3 L/m nasal cannula oxygen.  5. Leukocytosis: Mild and stable.  6. Hypotension: Initial low blood pressures of 93/48. Antihypertensives held. Improved.  7. Chronic hypoxic respiratory failure: On home oxygen 2 L/m. Some concern for dysphagia on 2/8. Speech therapy evaluated 2/9 and recommend regular consistency diet and thin liquids.  8. Stage III chronic kidney disease: Stable.  9. Mild hyperkalemia: Resolved.  10. Chronic diastolic CHF: Clinically euvolemic. Continue oral Lasix.  11. CAD status post CABG: Asymptomatic of chest pain.  12. Complete heart block status post PPM: Monitor on telemetry which also shows episodes of NSVT.  13. Status post bioprosthetic AVR:  14. Type II DM: Reasonable inpatient control on SSI.  15. Essential hypertension:  Management as above.  79. S/P angioplasty and stenting LLE and left great toe amputation: As per vascular surgery follow-up, underwent excisional debridement of left great toe amputation site on 2/6 night and repeat I&D in all OR on 2/8 followed by negative pressure dressing.  17. Nonsustained VT: 2-D echo of/12/17: LVEF 55-60 percent, akinesis of the basal inferior myocardium severe pulmonary artery hypertension. Potassium >4. Magnesium 1.9 (was replaced 2/7). Continue monitoring on telemetry. Cardiology consultation appreciated. Started on metoprolol. 2-D echo 07/31/16: LVEF 55-60 percent, normal wall motion without regional wall motion abnormalities and severe pulmonary hypertension.  18. Dementia: Moderate at least. Continue Aricept.  76. Adult failure to thrive: Apparently has been declining for some time now.? Consider palliative care consultation for goals of care.   DVT prophylaxis: Started IV heparin 07/30/16. Coumadin on hold since admission. Code Status: Full Family Communication: Discussed in detail with patient's spouse. Updated care and answered questions. Disposition Plan: DC back to SNF when medically stable.   Consultants:   Vascular surgery  Interventional radiology  Cardiology-pending  Procedures:   Therapeutic right thoracentesis on 07/28/16  Left great toe amputation site debridement by vascular surgery on 07/29/16  I&D left great toe 07/31/16  2-D echo 07/31/16: Study Conclusions  - Left ventricle: The cavity size was normal. There was moderate   concentric hypertrophy. Systolic function was normal. The   estimated ejection fraction was in the range of 55% to 60%. Wall   motion was normal; there were no regional wall motion   abnormalities. The study is not technically sufficient to allow   evaluation of LV diastolic function. - Ventricular septum: Septal motion showed paradox. These changes   are consistent with RV pressure overload. - Aortic valve: A  bioprosthesis was present and functioning   normally. Valve area (VTI): 1.44 cm^2. Valve area (Vmax): 1.45   cm^2. Valve area (Vmean): 1.48 cm^2. - Mitral valve: Severely calcified annulus. The findings are   consistent with mild stenosis. There was mild to moderate   regurgitation directed centrally. Valve area by continuity   equation (using LVOT flow): 1.35 cm^2. - Left atrium: The atrium was moderately to severely dilated. - Right ventricle: The cavity size was moderately dilated. Systolic   function was mildly to moderately reduced. - Tricuspid valve: There was moderate regurgitation directed   centrally. - Pulmonary arteries: Systolic pressure was severely increased. PA   peak pressure: 83 mm Hg (S).   Antimicrobials:   None    Subjective: Seen this morning. Denied complaints. Denied dyspnea, chest pain or right shoulder pain. As per RN, no acute issues reported.  Objective:  Vitals:   07/31/16 2009 08/01/16 0500 08/01/16 0828 08/01/16 1357  BP: (!) 113/45 120/65 (!) 112/54 (!) 109/49  Pulse: 61 (!) 59 64 (!) 58  Resp: (!) 26 (!) 21  (!) 25  Temp: 97.6 F (36.4 C) 97.9 F (36.6 C)  97.7 F (36.5 C)  TempSrc: Oral Axillary  Oral  SpO2: 100% 98%  96%  Weight:  87.1 kg (192 lb)    Height:        Intake/Output Summary (Last 24 hours) at 08/01/16 1456 Last data filed at 08/01/16 0500  Gross per 24 hour  Intake              345 ml  Output  300 ml  Net               45 ml   Filed Weights   07/30/16 0534 07/31/16 0418 08/01/16 0500  Weight: 85.6 kg (188 lb 12.8 oz) 87 kg (191 lb 12.8 oz) 87.1 kg (192 lb)    Examination:  General exam: Pleasant elderly male, moderately built and frail, chronically ill looking, lying comfortably propped up in bed. Respiratory system: Diminished breath sounds in the bases but otherwise clear to auscultation. Respiratory effort normal. Cardiovascular system: S1 & S2 heard, RRR. No JVD, murmurs, rubs, gallops or clicks.  No pedal edema. Telemetry: V paced rhythm. 2 episodes of 6 and 7 beat NSVT. Gastrointestinal system: Abdomen is nondistended, soft and nontender. No organomegaly or masses felt. Normal bowel sounds heard. Central nervous system: Alert and oriented 2. No focal neurological deficits. Extremities: Symmetric 5 x 5 power except right upper extremity which is immobilized in a sling.. Skin: Left foot dressing clean and dry Psychiatry: Judgement and insight impaired. Mood & affect appropriate.     Data Reviewed: I have personally reviewed following labs and imaging studies  CBC:  Recent Labs Lab 07/25/16 2046 07/26/16 0414 07/27/16 0150 07/28/16 0302 07/30/16 1352 07/31/16 0400 08/01/16 0400  WBC 9.9 10.6* 10.4 11.8* 9.9 11.1* 12.4*  NEUTROABS 6.9 8.1* 7.8* 9.4*  --   --   --   HGB 8.3* 8.9* 8.3* 8.0* 8.2* 7.5* 8.2*  HCT 26.9* 29.1* 27.3* 26.2* 27.7* 24.8* 26.7*  MCV 88.5 89.0 89.5 90.3 90.2 89.2 87.8  PLT 301 326 331 308 368 361 [id number]   Basic Metabolic Panel:  Recent Labs Lab 07/28/16 0302 07/29/16 0302 07/30/16 0825 07/30/16 1352 07/31/16 0400 08/01/16 0400  NA 136 139 138  --  135 136  K 5.4* 4.9 4.3  --  4.4 4.5  CL 98* 97* 97*  --  96* 96*  CO2 32 34* 35*  --  32 31  GLUCOSE 164* 158* 158*  --  148* 154*  BUN 54* 48* 37*  --  39* 35*  CREATININE 1.11 1.08 0.91  --  0.88 0.98  CALCIUM 9.5 9.7 9.7  --  9.3 9.5  MG  --   --   --  1.9  --   --    GFR: Estimated Creatinine Clearance: 63.1 mL/min (by C-G formula based on SCr of 0.98 mg/dL). Liver Function Tests: No results for input(s): AST, ALT, ALKPHOS, BILITOT, PROT, ALBUMIN in the last 168 hours. No results for input(s): LIPASE, AMYLASE in the last 168 hours. No results for input(s): AMMONIA in the last 168 hours. Coagulation Profile:  Recent Labs Lab 07/26/16 1244 07/27/16 0600 07/27/16 1140 07/28/16 0302 07/30/16 0825  INR 2.60 2.44 2.36 1.54 1.30   Cardiac Enzymes: No results for input(s): CKTOTAL,  CKMB, CKMBINDEX, TROPONINI in the last 168 hours. BNP (last 3 results) No results for input(s): PROBNP in the last 8760 hours. HbA1C: No results for input(s): HGBA1C in the last 72 hours. CBG:  Recent Labs Lab 07/31/16 1117 07/31/16 1820 07/31/16 2103 08/01/16 0731 08/01/16 1126  GLUCAP 151* 159* 120* 152* 178*   Lipid Profile: No results for input(s): CHOL, HDL, LDLCALC, TRIG, CHOLHDL, LDLDIRECT in the last 72 hours. Thyroid Function Tests: No results for input(s): TSH, T4TOTAL, FREET4, T3FREE, THYROIDAB in the last 72 hours. Anemia Panel:  Recent Labs  07/31/16 1503  VITAMINB12 745  FOLATE 9.9  FERRITIN 59  TIBC 300  IRON 17*  RETICCTPCT 6.4*  Sepsis Labs:  Recent Labs Lab 07/25/16 2046  07/28/16 0302 07/30/16 1352 07/31/16 0400 08/01/16 0400  WBC 9.9  < > 11.8* 9.9 11.1* 12.4*  LATICACIDVEN 0.6  --   --   --   --   --   < > = values in this interval not displayed.  Recent Results (from the past 240 hour(s))  MRSA PCR Screening     Status: None   Collection Time: 07/25/16  5:28 AM  Result Value Ref Range Status   MRSA by PCR NEGATIVE NEGATIVE Final    Comment:        The GeneXpert MRSA Assay (FDA approved for NASAL specimens only), is one component of a comprehensive MRSA colonization surveillance program. It is not intended to diagnose MRSA infection nor to guide or monitor treatment for MRSA infections.   Culture, body fluid-bottle     Status: None (Preliminary result)   Collection Time: 07/28/16  3:09 PM  Result Value Ref Range Status   Specimen Description FLUID RIGHT PLEURAL  Final   Special Requests BOTTLES DRAWN AEROBIC AND ANAEROBIC 10CC  Final   Culture NO GROWTH 4 DAYS  Final   Report Status PENDING  Incomplete  Gram stain     Status: None   Collection Time: 07/28/16  3:09 PM  Result Value Ref Range Status   Specimen Description FLUID RIGHT PLEURAL  Final   Special Requests NONE  Final   Gram Stain   Final    RARE WBC PRESENT,  PREDOMINANTLY PMN NO ORGANISMS SEEN    Report Status 07/28/2016 FINAL  Final         Radiology Studies: No results found.      Scheduled Meds: . aspirin EC  81 mg Oral Daily  . donepezil  10 mg Oral QHS  . ezetimibe-simvastatin  1 tablet Oral QHS  . furosemide  40 mg Oral Daily  . insulin aspart  0-9 Units Subcutaneous TID WC  . metoprolol tartrate  12.5 mg Oral BID  . nystatin   Topical TID  . warfarin  5 mg Oral ONCE-1800  . Warfarin - Pharmacist Dosing Inpatient   Does not apply q1800   Continuous Infusions: . heparin       LOS: 8 days        [facility], MD [facility] Pager [phone]  If 7PM-7AM, please contact night-coverage [email or web address] Password [email or web address] 08/01/2016, 2:56 PM

## 2016-08-01 NOTE — Progress Notes (Signed)
[place] for Heparin AND Coumadin Indication: atrial fibrillation  Allergies  Allergen Reactions  . Potassium-Containing Compounds Anaphylaxis    IV--loss of conciousness  . Metoprolol Succinate Other (See Comments)    Extremely tired    Patient Measurements: Height: 5\' 10"  (177.8 cm) Weight: 192 lb (87.1 kg) IBW/kg (Calculated) : 73 Heparin Dosing Weight:  73 kg  Vital Signs: Temp: 97.7 F (36.5 C) (02/09 1357) Temp Source: Oral (02/09 1357) BP: 109/49 (02/09 1357) Pulse Rate: 58 (02/09 1357)  Labs:  Recent Labs  07/30/16 0825  07/30/16 1352  07/31/16 0400 08/01/16 0400 08/01/16 1830  HGB  --   < > 8.2*  --  7.5* 8.2*  --   HCT  --   --  27.7*  --  24.8* 26.7*  --   PLT  --   --  368  --  361 340  --   LABPROT 16.2*  --   --   --   --   --   --   INR 1.30  --   --   --   --   --   --   HEPARINUNFRC  --   --   --   < > 0.28* <0.10* 0.33  CREATININE 0.91  --   --   --  0.88 0.98  --   < > = values in this interval not displayed.  Estimated Creatinine Clearance: 63.1 mL/min (by C-G formula based on SCr of 0.98 mg/dL).  Assessment:  Anticoag: HepRx for AFib, while Coumadin on hold. S/P toe amp 2/8,  Hg 7.5>transfuse 2/8> Hgb 8.2 today. pltc wnl. Heparin was resumed earlier today s/p I&D of L great toe and application of a wound vac. Evening heparin level is therapeutic.  PTA warfarin dose: 4.5 mg daily   Goal of Therapy:  Heparin level 0.3-0.7 units/ml  INR 2-3 Monitor platelets by anticoagulation protocol: Yes    Plan:  -Continue heparin at 1200 units/hr -Daily HL, CBC   Jahvon Gosline, Jake Church 08/01/2016,7:17 PM

## 2016-08-01 NOTE — Progress Notes (Signed)
  [facility] Progress Note  Subjective  - POD #1  Left foot hurts some  Objective Vitals:   08/01/16 0500 08/01/16 0828  BP: 120/65 (!) 112/54  Pulse: (!) 59 64  Resp: (!) 21   Temp: 97.9 F (36.6 C)     Intake/Output Summary (Last 24 hours) at 08/01/16 1008 Last data filed at 08/01/16 0500  Gross per 24 hour  Intake              545 ml  Output              660 ml  Net             -115 ml   Left foot warm with VAC seal intact  Assessment/Planning: 82 y.o. male is s/p: I & D left great toe and application of negative pressure dressing 1 Day Post-Op   Plan VAC change tomorrow. Will need VAC at d/c.  Ok to restart coumadin from vascular standpoint.   Alvia Grove 08/01/2016 10:08 AM --  Laboratory CBC    Component Value Date/Time   WBC 12.4 (H) 08/01/2016 0400   HGB 8.2 (L) 08/01/2016 0400   HGB 14.1 11/22/2010 0953   HCT 26.7 (L) 08/01/2016 0400   HCT 42.6 11/22/2010 0953   PLT 340 08/01/2016 0400   PLT 223 11/22/2010 0953    BMET    Component Value Date/Time   NA 136 08/01/2016 0400   K 4.5 08/01/2016 0400   CL 96 (L) 08/01/2016 0400   CO2 31 08/01/2016 0400   GLUCOSE 154 (H) 08/01/2016 0400   GLUCOSE 229 08/07/2008 0000   BUN 35 (H) 08/01/2016 0400   CREATININE 0.98 08/01/2016 0400   CREATININE 1.17 03/19/2016 1534   CALCIUM 9.5 08/01/2016 0400   GFRNONAA >60 08/01/2016 0400   GFRAA >60 08/01/2016 0400    COAG Lab Results  Component Value Date   INR 1.30 07/30/2016   INR 1.54 07/28/2016   INR 2.36 07/27/2016   PROTIME 16.2 12/05/2008   No results found for: PTT  Antibiotics Anti-infectives    Start     Dose/Rate Route Frequency Ordered Stop   07/31/16 0954  ceFAZolin (ANCEF) 2-4 GM/100ML-% IVPB    Comments:  Fabian Sharp   : cabinet override      07/31/16 0954 07/31/16 2159   07/31/16 0700  ceFAZolin (ANCEF) IVPB 1 g/50 mL premix    Comments:  Send with pt to OR   1 g 100 mL/hr over 30 Minutes Intravenous  To ShortStay Surgical 07/30/16 1023 07/31/16 [place], PA-C [facility] Office: [phone] Pager: [phone] 08/01/2016 10:08 AM

## 2016-08-01 NOTE — Evaluation (Signed)
Clinical/Bedside Swallow Evaluation Patient Details  Name: [name]. MRN: [id number] Date of Birth: [birth year]  Today's Date: 08/01/2016 Time: SLP Start Time (ACUTE ONLY): 0902 SLP Stop Time (ACUTE ONLY): 0916 SLP Time Calculation (min) (ACUTE ONLY): 14 min  Past Medical History:  Past Medical History:  Diagnosis Date  . Aortal stenosis 2006   biophrostetic  . Arthritis   . Atrial fibrillation ([place])    onset after CABG--- coumadin management at Coumadin clinic  . Carotid artery disease ([place]) 09/27/2010   Carotid US 12/17: bilat ICA 40-59 >> 1 year FU  . CHF (congestive heart failure) ([place])    abter CABG  . Complete heart block ([place])   . Coronary artery disease   . DM type 2 (diabetes mellitus, type 2) ([place])    used to see Dr Meredith Pel, now f/u by Dr Larose Kells  . Erectile dysfunction   . Gangrene ([place])   . History of nephrolithiasis   . HTN (hypertension)   . Hyperlipidemia   . Memory loss   . Obesity   . OSA (obstructive sleep apnea)    not using CPAP  . Pacemaker 2/07   VVI  . PVD (peripheral vascular disease) ([name])    CT angio showed- vascular insuff, intestine and RAS bilaterally, andgiogram 02/2009 severe PVD medical management  . S/P aortic valve replacement   . Sick sinus syndrome ([name])    [name]  . Skin cancer    surgery (nose) 09-2010   Past Surgical History:  Past Surgical History:  Procedure Laterality Date  . ABDOMINAL AORTAGRAM N/A 11/10/2012   Procedure: ABDOMINAL Maxcine Ham;  Surgeon: Serafina Mitchell, MD;  Location: [facility] CATH LAB;  Service: Cardiovascular;  Laterality: N/A;  . AMPUTATION  01/2010   great toe  . AMPUTATION Left 07/16/2016   Procedure: RECECTION OF LEFT GREAT TOE METATARSAL HEAD and sesamoid;  Surgeon: Waynetta Sandy, MD;  Location: [place];  Service: Vascular;  Laterality: Left;  . AMPUTATION Left 07/31/2016   Procedure: SECOND TOE AMPUTATION;  Surgeon: Serafina Mitchell, MD;  Location: [place] OR;  Service: Vascular;  Laterality:  Left;  . ANGIOPLASTY / STENTING FEMORAL  02/10/10   Left femoral  . ANGIOPLASTY ILLIAC ARTERY Left 07/16/2016   Procedure: drug coated Balloon ANGIOPLASTY of left superficial femoral artry stent;  Surgeon: Waynetta Sandy, MD;  Location: [place];  Service: Vascular;  Laterality: Left;  . AORTIC VALVE REPLACEMENT     biophrostetic for Ao Stenosis 2006  . AORTIC VALVE REPLACEMENT  01/17/2011   S/P redo median sternotomy, extracorporeal circulation, redo Aortic Valve Replacement using a 23-mm Edwards pericardial Magna-Ease valve, Dr Ulla Gallo  . AORTOGRAM N/A 07/16/2016   Procedure: left lower extremity angiogram;  Surgeon: Waynetta Sandy, MD;  Location: [place];  Service: Vascular;  Laterality: N/A;  . APPLICATION OF WOUND VAC Left 07/31/2016   Procedure: APPLICATION OF WOUND VAC;  Surgeon: Serafina Mitchell, MD;  Location: [place];  Service: Vascular;  Laterality: Left;  . CATARACT EXTRACTION, BILATERAL    . CORONARY ARTERY BYPASS GRAFT  2006  . EP IMPLANTABLE DEVICE N/A 06/06/2016   Procedure: PPM Generator Changeout;  Surgeon: Thompson Grayer, MD;  Location: [place] CV LAB;  Service: Cardiovascular;  Laterality: N/A;  . EYE SURGERY    . ILIAC ATHERECTOMY Left 07/16/2016   Procedure: Angioplasty and ATHERECTOMY of left anterior tibial artery;  Surgeon: Waynetta Sandy, MD;  Location: [place];  Service: Vascular;  Laterality: Left;  .  INCISE AND DRAIN ABCESS Right May 2014   right heel   . LOWER EXTREMITY ANGIOGRAM Left 11/23/2012   Procedure: LOWER EXTREMITY ANGIOGRAM;  Surgeon: Serafina Mitchell, MD;  Location: [facility] CATH LAB;  Service: Cardiovascular;  Laterality: Left;  . PACEMAKER PLACEMENT  07/2005   Medtronic Sigma SR implanted by Dr Olevia Perches  . PERIPHERAL VASCULAR CATHETERIZATION N/A 07/10/2016   Procedure: Abdominal Aortogram;  Surgeon: Conrad Okolona, MD;  Location: [place] CV LAB;  Service: Cardiovascular;  Laterality: N/A;  . PERIPHERAL VASCULAR CATHETERIZATION  Bilateral 07/10/2016   Procedure: Lower Extremity Angiography;  Surgeon: Conrad Weippe, MD;  Location: [place] CV LAB;  Service: Cardiovascular;  Laterality: Bilateral;  . TONSILLECTOMY    . ULTRASOUND GUIDANCE FOR VASCULAR ACCESS Bilateral 07/16/2016   Procedure: ULTRASOUND GUIDANCE FOR VASCULAR ACCESS of right common femoral and left dorsalis pedis;  Surgeon: Waynetta Sandy, MD;  Location: [facility] OR;  Service: Vascular;  Laterality: Bilateral;  . WOUND DEBRIDEMENT Left 07/31/2016   Procedure: DEBRIDEMENT GREAT TOE AMPUTATION SITE;  Surgeon: Serafina Mitchell, MD;  Location: [facility] OR;  Service: Vascular;  Laterality: Left;   HPI:  82 year old male with PMH of severe AS s/p redo AVR (bioprosthetic valve), chronic diastolic CHF, CAD status post CABG, complete heart block s/p PPM, A. fib on chronic Coumadin, history of left atrial thrombus by TEE 7/12, PAD, COPD on home oxygen 2 L/m, type II DM, HTN, HLD, OSA not on CPAP, left foot osteomyelitis status post amputation of great toe, Alzheimer's dementia, recent hospitalization 1/11-1/29 for acute respiratory failure with hypoxia, noted to have a large right-sided pleural effusion for which he underwent thoracentesis, diabetic foot ulcer with osteomyelitis treated with antibiotics and then discharged to [facility] for 3 days, presented back to the ED following a fall on day of admission, found on floor. Also noted more lethargic than usual and worsening short-term memory. X-rays confirmed right humeral neck fracture.  Vascular surgery consulted for left foot wound management-underwent I&D of left great toe on 2/6 and 2/8.  Pt was here for a week on an oral diet - no documentation suggests necessity of swallow assessment, but pt is NPO until SLP swallow can be completed.    Assessment / Plan / Recommendation Clinical Impression  Pt presents with functional swallow with sufficient oral attention, adequate mastication.  He coughed initially after  consumption of water, but no subsequent swallows were c/b signs of aspiration (five ounces).  Pt with intermittent belching throughout assessment.  RR within normal parameters for safe eating.  Lungs remain clear.  Recommend resuming a regular consistency diet, thin liquids.  If mentation waxes and wanes, then hold meal tray when not alert.  Give meds whole with puree.  SLP services will sign off - please reconsult if difficulty recurs.      Aspiration Risk  Mild aspiration risk    Diet Recommendation   carb modified/HH; thin liquids  Medication Administration: Whole meds with puree    Other  Recommendations Oral Care Recommendations: Oral care BID   Follow up Recommendations None      Frequency and Duration            Prognosis        Swallow Study   General Date of Onset: 07/24/16 HPI: 82 year old male with PMH of severe AS s/p redo AVR (bioprosthetic valve), chronic diastolic CHF, CAD status post CABG, complete heart block s/p PPM, A. fib on chronic Coumadin, history of left atrial thrombus  by TEE 7/12, PAD, COPD on home oxygen 2 L/m, type II DM, HTN, HLD, OSA not on CPAP, left foot osteomyelitis status post amputation of great toe, Alzheimer's dementia, recent hospitalization 1/11-1/29 for acute respiratory failure with hypoxia, noted to have a large right-sided pleural effusion for which he underwent thoracentesis, diabetic foot ulcer with osteomyelitis treated with antibiotics and then discharged to [facility] for 3 days, presented back to the ED following a fall on day of admission, found on floor. Also noted more lethargic than usual and worsening short-term memory. X-rays confirmed right humeral neck fracture.  Vascular surgery consulted for left foot wound management-underwent I&D of left great toe on 2/6 and 2/8. Type of Study: Bedside Swallow Evaluation Previous Swallow Assessment: no Diet Prior to this Study: NPO Temperature Spikes Noted: No Respiratory Status: Nasal  cannula History of Recent Intubation: No Behavior/Cognition: Alert Oral Cavity Assessment: Within Functional Limits Oral Care Completed by SLP: No Oral Cavity - Dentition: Poor condition Vision: Functional for self-feeding Self-Feeding Abilities: Able to feed self Patient Positioning: Upright in bed Baseline Vocal Quality: Normal Volitional Cough: Strong Volitional Swallow: Able to elicit    Oral/Motor/Sensory Function Overall Oral Motor/Sensory Function: Within functional limits   Ice Chips Ice chips: Within functional limits   Thin Liquid Thin Liquid: Within functional limits Presentation: Cup;Straw Other Comments: cough after initial swallow; this resolved for subsequent swallows    Nectar Thick Nectar Thick Liquid: Not tested   Honey Thick Honey Thick Liquid: Not tested   Puree Puree: Within functional limits   Solid   GO   Solid: Within functional limits        Juan Quam Laurice 08/01/2016,9:25 AM

## 2016-08-01 NOTE — Care Management Important Message (Signed)
Important Message  Patient Details  Name: [name]. MRN: [name] Date of Birth: 03/03/1935   Medicare Important Message Given:  Yes    [name] Abena 08/01/2016, 2:01 PM

## 2016-08-01 NOTE — Progress Notes (Signed)
ANTICOAGULATION CONSULT NOTE - Follow Up Consult  Pharmacy Consult for Heparin AND Coumadin Indication: atrial fibrillation  Allergies  Allergen Reactions  . Potassium-Containing Compounds Anaphylaxis    IV--loss of conciousness  . Metoprolol Succinate Other (See Comments)    Extremely tired    Patient Measurements: Height: 5\' 10"  (177.8 cm) Weight: 192 lb (87.1 kg) IBW/kg (Calculated) : 73 Heparin Dosing Weight:  73 kg  Vital Signs: Temp: 97.9 F (36.6 C) (02/09 0500) Temp Source: Axillary (02/09 0500) BP: 112/54 (02/09 0828) Pulse Rate: 64 (02/09 0828)  Labs:  Recent Labs  07/30/16 0825  07/30/16 1352 07/30/16 1921 07/31/16 0400 08/01/16 0400  HGB  --   < > 8.2*  --  7.5* 8.2*  HCT  --   --  27.7*  --  24.8* 26.7*  PLT  --   --  368  --  361 340  LABPROT 16.2*  --   --   --   --   --   INR 1.30  --   --   --   --   --   HEPARINUNFRC  --   --   --  0.89* 0.28* <0.10*  CREATININE 0.91  --   --   --  0.88 0.98  < > = values in this interval not displayed.  Estimated Creatinine Clearance: 63.1 mL/min (by C-G formula based on SCr of 0.98 mg/dL).  Assessment:  Anticoag: HepRx for AFib, while Coumadin on hold. S/P toe amp 2/8,  Hg 7.5>transfuse 2/8> Hgb 8.2 today. pltc wnl. Spoke with Dr. Algis Liming and called Vascular surgery who ok'd restart of Hep/Coum. PTA warfarin dose: 4.5 mg daily,   Goal of Therapy:  Heparin level 0.3-0.7 units/ml  INR 2-3 Monitor platelets by anticoagulation protocol: Yes   Plan:  Vascular ok to resume heparin and Coumadin per phone call Resume IV heparin (no bolus) at 1200 unit/hr Check HL in 6 hrs, then daily Coumadin 5mg  po x 1 tonight   Charlisa Cham S. Alford Highland, PharmD, [place] Clinical Staff Pharmacist Pager [phone]  Eilene Ghazi Stillinger 08/01/2016,11:18 AM

## 2016-08-01 NOTE — Progress Notes (Signed)
Progress Note  Patient Name: [name]. Date of Encounter: 08/01/2016  Primary Cardiologist: Aundra Dubin  Subjective   No chest pain or dyspnea  Inpatient Medications    Scheduled Meds: . aspirin EC  81 mg Oral Daily  . donepezil  10 mg Oral QHS  . ezetimibe-simvastatin  1 tablet Oral QHS  . furosemide  40 mg Oral Daily  . insulin aspart  0-9 Units Subcutaneous TID WC  . metoprolol tartrate  12.5 mg Oral BID  . nystatin   Topical TID   Continuous Infusions:  PRN Meds: acetaminophen **OR** acetaminophen, albuterol, ipratropium, ondansetron **OR** ondansetron (ZOFRAN) IV   Vital Signs    Vitals:   07/31/16 1707 07/31/16 1937 07/31/16 2009 08/01/16 0500  BP:  (!) 119/46 (!) 113/45 120/65  Pulse:  60 61 (!) 59  Resp:  (!) 25 (!) 26 (!) 21  Temp: 97.9 F (36.6 C) 97.6 F (36.4 C) 97.6 F (36.4 C) 97.9 F (36.6 C)  TempSrc: Oral Oral Oral Axillary  SpO2:  99% 100% 98%  Weight:    192 lb (87.1 kg)  Height:        Intake/Output Summary (Last 24 hours) at 08/01/16 0823 Last data filed at 08/01/16 0500  Gross per 24 hour  Intake              545 ml  Output              660 ml  Net             -115 ml   Filed Weights   07/30/16 0534 07/31/16 0418 08/01/16 0500  Weight: 188 lb 12.8 oz (85.6 kg) 191 lb 12.8 oz (87 kg) 192 lb (87.1 kg)    Telemetry    Sinus, paced - Personally Reviewed  ECG     Physical Exam   GEN: No acute distress.   Neck: No JVD Cardiac: RRR, no murmurs, rubs, or gallops.  Respiratory: Clear to auscultation bilaterally. GI: Soft, nontender, non-distended  Ext: no edema bilateral LE. Boot on right foot.   Labs    Chemistry Recent Labs Lab 07/30/16 0825 07/31/16 0400 08/01/16 0400  NA 138 135 136  K 4.3 4.4 4.5  CL 97* 96* 96*  CO2 35* 32 31  GLUCOSE 158* 148* 154*  BUN 37* 39* 35*  CREATININE 0.91 0.88 0.98  CALCIUM 9.7 9.3 9.5  GFRNONAA >60 >60 >60  GFRAA >60 >60 >60  ANIONGAP 6 7 9      Hematology Recent  Labs Lab 07/30/16 1352 07/31/16 0400 07/31/16 1503 08/01/16 0400  WBC 9.9 11.1*  --  12.4*  RBC 3.07* 2.78* 2.86* 3.04*  HGB 8.2* 7.5*  --  8.2*  HCT 27.7* 24.8*  --  26.7*  MCV 90.2 89.2  --  87.8  MCH 26.7 27.0  --  27.0  MCHC 29.6* 30.2  --  30.7  RDW 16.1* 16.2*  --  15.8*  PLT 368 361  --  340    Radiology    Dg Chest Port 1 View  Result Date: 07/30/2016 CLINICAL DATA:  Recurrent RIGHT pleural effusion. EXAM: PORTABLE CHEST 1 VIEW COMPARISON:  07/28/2016 FINDINGS: Sternotomy wires overlie normal cardiac silhouette. Lung bases are poorly evaluated. Fine pulmonary edema pattern similar to comparison exam. No pneumothorax. IMPRESSION: Cardiomegaly and mild pulmonary edema.  No significant change. Lung bases are poorly defined to body habitus and overlying soft tissue Electronically Signed   By: Helane Gunther.D.  On: 07/30/2016 13:59    Cardiac Studies   Echo 07/31/16: - Left ventricle: The cavity size was normal. There was moderate   concentric hypertrophy. Systolic function was normal. The   estimated ejection fraction was in the range of 55% to 60%. Wall   motion was normal; there were no regional wall motion   abnormalities. The study is not technically sufficient to allow   evaluation of LV diastolic function. - Ventricular septum: Septal motion showed paradox. These changes   are consistent with RV pressure overload. - Aortic valve: A bioprosthesis was present and functioning   normally. Valve area (VTI): 1.44 cm^2. Valve area (Vmax): 1.45   cm^2. Valve area (Vmean): 1.48 cm^2. - Mitral valve: Severely calcified annulus. The findings are   consistent with mild stenosis. There was mild to moderate   regurgitation directed centrally. Valve area by continuity   equation (using LVOT flow): 1.35 cm^2. - Left atrium: The atrium was moderately to severely dilated. - Right ventricle: The cavity size was moderately dilated. Systolic   function was mildly to moderately  reduced. - Tricuspid valve: There was moderate regurgitation directed   centrally. - Pulmonary arteries: Systolic pressure was severely increased. PA   peak pressure: 83 mm Hg (S).  Patient Profile     82 y.o. male with a PMH significant for diastolic heart failure, AS s/p AVR, CAD s/p CABG, sick sinus syndrome and complete heart block s/p PPM, atrial fibrillation on coumadin with hx of left atrial thrombus in 2012, PVD, COPD on home oxygen, DM, s/p left foot osteomyelitis s/p amputation (06/2016), and dementia. He presented to the ED after a fall and found to have a right humeral neck fracture. Cardiology was consulted for nonsustained VT on telemetry.  Assessment & Plan    1. Non-sustained VT: Still having short runs of NSVT. LV function is normal. Continue beta blocker.   2. Atrial fibrillation/Sick sinus syndrome/Complete heart block: He has permanent pacemaker. This patients CHA2DS2-VASc Score and unadjusted Ischemic Stroke Rate (% per year) is equal to 9.7 % stroke rate/year from a score of 6 (age, CHF, HTN, DM, vascular disease). He is on ASA. Coumadin on hold with recent procedures.   3. Chronic Diastolic CHF: Volume status is ok. Continue daily Lasix  4. CAD s/p CABG: Stable. No changes. Continue ASA and vytorin.   5. Anemia: per primary team, would recommend transfusing if Hb below 8  We will be available over the weekend. Will see again on Monday  Signed, Lauree Chandler, MD  08/01/2016, 8:23 AM

## 2016-08-01 NOTE — Clinical Social Work Note (Signed)
CSW continues to follow the patient for discharge needs. CSW has left report for weekend CSW.  Liz Beach MSW, [place], [place], [id number]

## 2016-08-02 LAB — GLUCOSE, CAPILLARY
GLUCOSE-CAPILLARY: 136 mg/dL — AB (ref 65–99)
GLUCOSE-CAPILLARY: 143 mg/dL — AB (ref 65–99)
GLUCOSE-CAPILLARY: 187 mg/dL — AB (ref 65–99)
GLUCOSE-CAPILLARY: 168 mg/dL — AB (ref 65–99)
Glucose-Capillary: 146 mg/dL — ABNORMAL HIGH (ref 65–99)

## 2016-08-02 LAB — CULTURE, BODY FLUID W GRAM STAIN -BOTTLE: Culture: NO GROWTH

## 2016-08-02 LAB — PROTIME-INR
INR: 1.66
Prothrombin Time: 19.8 seconds — ABNORMAL HIGH (ref 11.4–15.2)

## 2016-08-02 LAB — CBC
HEMATOCRIT: 27.6 % — AB (ref 39.0–52.0)
HEMOGLOBIN: 8.3 g/dL — AB (ref 13.0–17.0)
MCH: 27 pg (ref 26.0–34.0)
MCHC: 30.1 g/dL (ref 30.0–36.0)
MCV: 89.9 fL (ref 78.0–100.0)
Platelets: 308 10*3/uL (ref 150–400)
RBC: 3.07 MIL/uL — ABNORMAL LOW (ref 4.22–5.81)
RDW: 15.8 % — AB (ref 11.5–15.5)
WBC: 10.9 10*3/uL — ABNORMAL HIGH (ref 4.0–10.5)

## 2016-08-02 LAB — HEPARIN LEVEL (UNFRACTIONATED): HEPARIN UNFRACTIONATED: 0.45 [IU]/mL (ref 0.30–0.70)

## 2016-08-02 MED ORDER — WARFARIN SODIUM 5 MG PO TABS
5.0000 mg | ORAL_TABLET | Freq: Once | ORAL | Status: AC
  Administered 2016-08-02: 5 mg via ORAL
  Filled 2016-08-02: qty 1

## 2016-08-02 NOTE — Progress Notes (Signed)
[place] for Heparin AND Coumadin Indication: atrial fibrillation  Allergies  Allergen Reactions  . Potassium-Containing Compounds Anaphylaxis    IV--loss of conciousness  . Metoprolol Succinate Other (See Comments)    Extremely tired    Patient Measurements: Height: 5\' 10"  (177.8 cm) Weight: 194 lb 12.8 oz (88.4 kg) IBW/kg (Calculated) : 73 Heparin Dosing Weight:  73 kg  Vital Signs: Temp: 98.2 F (36.8 C) (02/10 0603) Temp Source: Axillary (02/10 0603) BP: 115/47 (02/10 0603) Pulse Rate: 59 (02/10 0603)  Labs:  Recent Labs  07/31/16 0400 08/01/16 0400 08/01/16 1830 08/02/16 0643  HGB 7.5* 8.2*  --  8.3*  HCT 24.8* 26.7*  --  27.6*  PLT 361 340  --  308  LABPROT  --   --   --  19.8*  INR  --   --   --  1.66  HEPARINUNFRC 0.28* <0.10* 0.33 0.45  CREATININE 0.88 0.98  --   --     Estimated Creatinine Clearance: 68.5 mL/min (by C-G formula based on SCr of 0.98 mg/dL).  Assessment:  Anticoag: Heparin for AFib, while warfarin on hold (received vitamin K on 2/4). S/P toe amp 2/8 and blood transfusion. Heparin and warfarin were resumed 2/9 after procedure.   Heparin level is therapeutic today at 0.45. Hemoglobin is low table at 8.3. Mild bleeding from wound mentioned in MD note. INR remains subtherapeutic at 1.66 after first dose of warfarin last night. Patient does not appear to be eating well.   PTA warfarin dose: 4.5 mg daily  Goal of Therapy:  Heparin level 0.3-0.7 units/ml  INR 2-3 Monitor platelets by anticoagulation protocol: Yes    Plan:  -Continue heparin at 1200 units/hr -Warfarin 5mg  PO x1 tonight  -Monitor for signs/symptoms of bleeding -Daily HL, CBC, INR  Demetrius Charity, PharmD Acute Care Pharmacy Resident  Pager: [phone] 08/02/2016

## 2016-08-02 NOTE — Procedures (Signed)
Pt is resting comfortably on 3.5L.  BiPAP is no indicated at this time.

## 2016-08-02 NOTE — Progress Notes (Signed)
PROGRESS NOTE  [name].  SG:[id number] DOB: January 08, 1935  DOA: 07/24/2016 PCP: [name], MD   Brief Narrative:  82 year old male with PMH of severe AS s/p redo AVR (bioprosthetic valve), chronic diastolic CHF, CAD status post CABG, complete heart block s/p PPM, A. fib on chronic Coumadin, history of left atrial thrombus by TEE 7/12, PAD, COPD on home oxygen 2 L/m, type II DM, HTN, HLD, OSA not on CPAP, left foot osteomyelitis status post amputation of great toe, Alzheimer's dementia, recent hospitalization 1/11-1/29 for acute respiratory failure with hypoxia, noted to have a large right-sided pleural effusion for which she underwent thoracentesis, diabetic foot ulcer with osteomyelitis treated with antibiotics and then discharged to [facility] for 3 days, presented back to the ED following a fall on day of admission, found on floor. Also noted more lethargic than usual and worsening short-term memory. X-rays confirmed right humeral neck fracture. Hemoglobin 7.3. Vascular surgery consulted for left foot wound management-underwent I&D of left great toe on 2/6 and 2/8. Cardiology consulted for NSVT.   Assessment & Plan:   Principal Problem:   Right humeral fracture Active Problems:   DM (diabetes mellitus) type II uncontrolled, periph vascular disorder ([name])   S/P AVR (aortic valve replacement)   Chronic atrial fibrillation ([name])   Chronic diastolic heart failure ([name])   Pleural effusion   Anemia   Supratherapeutic INR   Chronic respiratory failure ([place])   Fall   Alzheimer's dementia   Fracture of humerus neck, right, closed, initial encounter   NSVT (nonsustained ventricular tachycardia) ([place])   1. Right humeral neck fracture: Sustained status post fall at SNF. X-ray of right shoulder 07/24/16 confirmed displaced mildly comminuted humeral neck fracture and no evidence of intra-articular extension. Continue right shoulder sling. Plan to discuss with orthopedics and likely  outpatient follow-up.  2. Acute on chronic anemia: Hemoglobin 12 g in December 2017 but has progressively dropped and was 7.3 on admission. No overt bleeding reported. Status post 1 unit PRBC transfusion. Hemoglobin 8 g per DL on 2/5 but has been gradually drifting again since 2/3. Hemoglobin 7.5 on 2/8. Transfused additional 1 unit PRBC 2/8. Hemoglobin up to 8.2. Anemia panel reviewed: Iron 17, TIBC 300, saturation ratio 6, ferritin 59, folate 9.9, B12: 745 and reticulocytes 183. Picture suggestive of iron deficiency. Started iron supplements. Consider outpatient GI evaluation/screening colonoscopy if not done.  3. Chronic A. fib: Presented with supratherapeutic INR of 3.6. Received vitamin K 1 mg on admission. Coumadin held. CHA2DS2VASc score is 6. INR 1.3. Metoprolol started by cardiology for NSVT which should help A. fib too. IV heparin bridging resumed and Coumadin per pharmacy.  4. Right-sided pleural effusion, recurrent: Status post 2.1 L thoracentesis on 2/5 with improved follow-up chest x-ray. Transudative. X-ray 07/30/16: Poor film. Reports cardiomegaly and mild pulmonary edema. Lung bases poorly defined due to body habitus and overlying soft tissue. Saturating in the high 90s on 3 L/m nasal cannula oxygen.  5. Leukocytosis: Mild and stable.  6. Hypotension: Initial low blood pressures of 93/48. Antihypertensives held. Improved.  7. Chronic hypoxic respiratory failure: On home oxygen 2 L/m. Some concern for dysphagia on 2/8. Speech therapy evaluated 2/9 and recommend regular consistency diet and thin liquids.  8. Stage III chronic kidney disease: Stable.  9. Mild hyperkalemia: Resolved.  10. Chronic diastolic CHF: Clinically euvolemic. Continue oral Lasix.  11. CAD status post CABG: Asymptomatic of chest pain.  12. Complete heart block status post PPM: Monitor on telemetry which  also shows episodes of NSVT.   13. Status post bioprosthetic AVR:  14. Type II DM: Reasonable inpatient  control on SSI.  15. Essential hypertension: Management as above.  41. S/P angioplasty and stenting LLE and left great toe amputation: As per vascular surgery follow-up, underwent excisional debridement of left great toe amputation site on 2/6 night and repeat I&D in all OR on 2/8 followed by negative pressure dressing.  17. Nonsustained VT: 2-D echo of/12/17: LVEF 55-60 percent, akinesis of the basal inferior myocardium severe pulmonary artery hypertension. Potassium >4. Magnesium 1.9 (was replaced 2/7). Continue monitoring on telemetry. Cardiology consultation appreciated. Started on metoprolol. 2-D echo 07/31/16: LVEF 55-60 percent, normal wall motion without regional wall motion abnormalities and severe pulmonary hypertension. Better.  18. Dementia: Moderate at least. Continue Aricept.  95. Adult failure to thrive: Apparently has been declining for some time now.? Consider palliative care consultation for goals of care >can be done at SNF..   DVT prophylaxis: Started IV heparin 07/30/16. Coumadin on hold since admission. Code Status: Full Family Communication: None at bedside. Disposition Plan: DC back to SNF when medically stable.   Consultants:   Vascular surgery  Interventional radiology  Cardiology  Procedures:   Therapeutic right thoracentesis on 07/28/16  Left great toe amputation site debridement by vascular surgery on 07/29/16  I&D left great toe 07/31/16  2-D echo 07/31/16: Study Conclusions  - Left ventricle: The cavity size was normal. There was moderate   concentric hypertrophy. Systolic function was normal. The   estimated ejection fraction was in the range of 55% to 60%. Wall   motion was normal; there were no regional wall motion   abnormalities. The study is not technically sufficient to allow   evaluation of LV diastolic function. - Ventricular septum: Septal motion showed paradox. These changes   are consistent with RV pressure overload. - Aortic valve: A  bioprosthesis was present and functioning   normally. Valve area (VTI): 1.44 cm^2. Valve area (Vmax): 1.45   cm^2. Valve area (Vmean): 1.48 cm^2. - Mitral valve: Severely calcified annulus. The findings are   consistent with mild stenosis. There was mild to moderate   regurgitation directed centrally. Valve area by continuity   equation (using LVOT flow): 1.35 cm^2. - Left atrium: The atrium was moderately to severely dilated. - Right ventricle: The cavity size was moderately dilated. Systolic   function was mildly to moderately reduced. - Tricuspid valve: There was moderate regurgitation directed   centrally. - Pulmonary arteries: Systolic pressure was severely increased. PA   peak pressure: 83 mm Hg (S).   Antimicrobials:   None    Subjective: Seen this morning. Denied complaints. Denied dyspnea, chest pain or right shoulder pain. As per RN, no acute issues reported. Patient usually does not say much.  Objective:  Vitals:   08/01/16 1357 08/01/16 2013 08/02/16 0603 08/02/16 1438  BP: (!) 109/49 (!) 123/47 (!) 115/47 (!) 112/55  Pulse: (!) 58 61 (!) 59 65  Resp: (!) 25 (!) 25 19 (!) 21  Temp: 97.7 F (36.5 C) 98.3 F (36.8 C) 98.2 F (36.8 C) 97.5 F (36.4 C)  TempSrc: Oral Axillary Axillary Oral  SpO2: 96% 100% 99% 96%  Weight:   88.4 kg (194 lb 12.8 oz)   Height:        Intake/Output Summary (Last 24 hours) at 08/02/16 1527 Last data filed at 08/02/16 1135  Gross per 24 hour  Intake  0 ml  Output             1800 ml  Net            -1800 ml   Filed Weights   07/31/16 0418 08/01/16 0500 08/02/16 0603  Weight: 87 kg (191 lb 12.8 oz) 87.1 kg (192 lb) 88.4 kg (194 lb 12.8 oz)    Examination:  General exam: Pleasant elderly male, moderately built and frail, chronically ill looking, lying comfortably propped up in bed. Respiratory system: Diminished breath sounds in the bases but otherwise clear to auscultation. Respiratory effort  normal. Cardiovascular system: S1 & S2 heard, RRR. No JVD, murmurs, rubs, gallops or clicks. No pedal edema. Telemetry: V paced rhythm.No further episodes of NSVT in the last 24 hours. Gastrointestinal system: Abdomen is nondistended, soft and nontender. No organomegaly or masses felt. Normal bowel sounds heard. Central nervous system: Alert and oriented 2. No focal neurological deficits. Extremities: Symmetric 5 x 5 power except right upper extremity which is immobilized in a sling.. Skin: Left great toe site wound VAC intact and draining. Psychiatry: Judgement and insight impaired. Mood & affect appropriate.     Data Reviewed: I have personally reviewed following labs and imaging studies  CBC:  Recent Labs Lab 07/27/16 0150 07/28/16 0302 07/30/16 1352 07/31/16 0400 08/01/16 0400 08/02/16 0643  WBC 10.4 11.8* 9.9 11.1* 12.4* 10.9*  NEUTROABS 7.8* 9.4*  --   --   --   --   HGB 8.3* 8.0* 8.2* 7.5* 8.2* 8.3*  HCT 27.3* 26.2* 27.7* 24.8* 26.7* 27.6*  MCV 89.5 90.3 90.2 89.2 87.8 89.9  PLT 331 308 368 361 340 [id number]   Basic Metabolic Panel:  Recent Labs Lab 07/28/16 0302 07/29/16 0302 07/30/16 0825 07/30/16 1352 07/31/16 0400 08/01/16 0400  NA 136 139 138  --  135 136  K 5.4* 4.9 4.3  --  4.4 4.5  CL 98* 97* 97*  --  96* 96*  CO2 32 34* 35*  --  32 31  GLUCOSE 164* 158* 158*  --  148* 154*  BUN 54* 48* 37*  --  39* 35*  CREATININE 1.11 1.08 0.91  --  0.88 0.98  CALCIUM 9.5 9.7 9.7  --  9.3 9.5  MG  --   --   --  1.9  --   --    GFR: Estimated Creatinine Clearance: 68.5 mL/min (by C-G formula based on SCr of 0.98 mg/dL). Liver Function Tests: No results for input(s): AST, ALT, ALKPHOS, BILITOT, PROT, ALBUMIN in the last 168 hours. No results for input(s): LIPASE, AMYLASE in the last 168 hours. No results for input(s): AMMONIA in the last 168 hours. Coagulation Profile:  Recent Labs Lab 07/27/16 0600 07/27/16 1140 07/28/16 0302 07/30/16 0825 08/02/16 0643  INR  2.44 2.36 1.54 1.30 1.66   Cardiac Enzymes: No results for input(s): CKTOTAL, CKMB, CKMBINDEX, TROPONINI in the last 168 hours. BNP (last 3 results) No results for input(s): PROBNP in the last 8760 hours. HbA1C: No results for input(s): HGBA1C in the last 72 hours. CBG:  Recent Labs Lab 08/01/16 2010 08/01/16 2312 08/02/16 0308 08/02/16 0746 08/02/16 1201  GLUCAP 136* 202* 146* 136* 143*   Lipid Profile: No results for input(s): CHOL, HDL, LDLCALC, TRIG, CHOLHDL, LDLDIRECT in the last 72 hours. Thyroid Function Tests: No results for input(s): TSH, T4TOTAL, FREET4, T3FREE, THYROIDAB in the last 72 hours. Anemia Panel:  Recent Labs  07/31/16 1503  VITAMINB12 745  FOLATE 9.9  FERRITIN  59  TIBC 300  IRON 17*  RETICCTPCT 6.4*    Sepsis Labs:  Recent Labs Lab 07/30/16 1352 07/31/16 0400 08/01/16 0400 08/02/16 0643  WBC 9.9 11.1* 12.4* 10.9*    Recent Results (from the past 240 hour(s))  MRSA PCR Screening     Status: None   Collection Time: 07/25/16  5:28 AM  Result Value Ref Range Status   MRSA by PCR NEGATIVE NEGATIVE Final    Comment:        The GeneXpert MRSA Assay (FDA approved for NASAL specimens only), is one component of a comprehensive MRSA colonization surveillance program. It is not intended to diagnose MRSA infection nor to guide or monitor treatment for MRSA infections.   Culture, body fluid-bottle     Status: None   Collection Time: 07/28/16  3:09 PM  Result Value Ref Range Status   Specimen Description FLUID RIGHT PLEURAL  Final   Special Requests BOTTLES DRAWN AEROBIC AND ANAEROBIC 10CC  Final   Culture NO GROWTH 5 DAYS  Final   Report Status 08/02/2016 FINAL  Final  Gram stain     Status: None   Collection Time: 07/28/16  3:09 PM  Result Value Ref Range Status   Specimen Description FLUID RIGHT PLEURAL  Final   Special Requests NONE  Final   Gram Stain   Final    RARE WBC PRESENT, PREDOMINANTLY PMN NO ORGANISMS SEEN    Report  Status 07/28/2016 FINAL  Final         Radiology Studies: No results found.      Scheduled Meds: . aspirin EC  81 mg Oral Daily  . docusate sodium  100 mg Oral BID  . donepezil  10 mg Oral QHS  . ezetimibe-simvastatin  1 tablet Oral QHS  . ferrous sulfate  325 mg Oral BID WC  . furosemide  40 mg Oral Daily  . insulin aspart  0-9 Units Subcutaneous TID WC  . metoprolol tartrate  12.5 mg Oral BID  . nystatin   Topical TID  . warfarin  5 mg Oral ONCE-1800  . Warfarin - Pharmacist Dosing Inpatient   Does not apply q1800   Continuous Infusions: . heparin 1,200 Units/hr (08/01/16 1200)     LOS: 9 days        Catrinia Racicot, MD [facility] Pager [phone]  If 7PM-7AM, please contact night-coverage [email or web address] Password [email or web address] 08/02/2016, 3:27 PM

## 2016-08-02 NOTE — Progress Notes (Addendum)
  [facility] Progress Note  Subjective  - POD #2  No complaints.   Objective Vitals:   08/01/16 2013 08/02/16 0603  BP: (!) 123/47 (!) 115/47  Pulse: 61 (!) 59  Resp: (!) 25 19  Temp: 98.3 F (36.8 C) 98.2 F (36.8 C)    Intake/Output Summary (Last 24 hours) at 08/02/16 0806 Last data filed at 08/02/16 [id number]  Gross per 24 hour  Intake                0 ml  Output             1450 ml  Net            -1450 ml   VAC dressing removed from left foot. Wound is clean with some granulation tissue present. No purulence. Mild bleeding. Wound is 6 cm x 3.5 cm x 3 cm.  Left AT doppler signal.   Assessment/Planning: 82 y.o. male is s/p: I & D left great toe 2 Days Post-Op   Left foot vac dressing changed. Wound is clean.  Next dressing change Tuesday.   Started back on coumadin yesterday. Currently on heparin. H/H stable.  [place] for d/c from vascular standpoint. Will need VAC at SNF.   Alvia Grove 08/02/2016 8:06 AM --  Laboratory CBC    Component Value Date/Time   WBC 10.9 (H) 08/02/2016 0643   HGB 8.3 (L) 08/02/2016 0643   HGB 14.1 11/22/2010 0953   HCT 27.6 (L) 08/02/2016 0643   HCT 42.6 11/22/2010 0953   PLT 308 08/02/2016 0643   PLT 223 11/22/2010 0953    BMET    Component Value Date/Time   NA 136 08/01/2016 0400   K 4.5 08/01/2016 0400   CL 96 (L) 08/01/2016 0400   CO2 31 08/01/2016 0400   GLUCOSE 154 (H) 08/01/2016 0400   GLUCOSE 229 08/07/2008 0000   BUN 35 (H) 08/01/2016 0400   CREATININE 0.98 08/01/2016 0400   CREATININE 1.17 03/19/2016 1534   CALCIUM 9.5 08/01/2016 0400   GFRNONAA >60 08/01/2016 0400   GFRAA >60 08/01/2016 0400    COAG Lab Results  Component Value Date   INR 1.66 08/02/2016   INR 1.30 07/30/2016   INR 1.54 07/28/2016   PROTIME 16.2 12/05/2008   No results found for: PTT  Antibiotics Anti-infectives    Start     Dose/Rate Route Frequency Ordered Stop   07/31/16 0954  ceFAZolin (ANCEF) 2-4 GM/100ML-%  IVPB    Comments:  Fabian Sharp   : cabinet override      07/31/16 0954 07/31/16 2159   07/31/16 0700  ceFAZolin (ANCEF) IVPB 1 g/50 mL premix    Comments:  Send with pt to OR   1 g 100 mL/hr over 30 Minutes Intravenous To ShortStay Surgical 07/30/16 1023 07/31/16 [place], PA-C [facility] Office: [phone] Pager: [phone] 08/02/2016 8:06 AM   I have independently interviewed patient and agree with PA assessment and plan above.   Brandon C. Donzetta Matters, MD [facility] [place] Office: [phone] Pager: [phone]

## 2016-08-03 LAB — GLUCOSE, CAPILLARY
GLUCOSE-CAPILLARY: 152 mg/dL — AB (ref 65–99)
GLUCOSE-CAPILLARY: 161 mg/dL — AB (ref 65–99)
GLUCOSE-CAPILLARY: 111 mg/dL — AB (ref 65–99)
Glucose-Capillary: 126 mg/dL — ABNORMAL HIGH (ref 65–99)
Glucose-Capillary: 162 mg/dL — ABNORMAL HIGH (ref 65–99)

## 2016-08-03 LAB — CBC
HEMATOCRIT: 25.9 % — AB (ref 39.0–52.0)
HEMOGLOBIN: 7.9 g/dL — AB (ref 13.0–17.0)
MCH: 27.1 pg (ref 26.0–34.0)
MCHC: 30.5 g/dL (ref 30.0–36.0)
MCV: 88.7 fL (ref 78.0–100.0)
Platelets: 309 10*3/uL (ref 150–400)
RBC: 2.92 MIL/uL — ABNORMAL LOW (ref 4.22–5.81)
RDW: 15.5 % (ref 11.5–15.5)
WBC: 11.9 10*3/uL — ABNORMAL HIGH (ref 4.0–10.5)

## 2016-08-03 LAB — PROTIME-INR
INR: 1.98
PROTHROMBIN TIME: 22.8 s — AB (ref 11.4–15.2)

## 2016-08-03 LAB — HEPARIN LEVEL (UNFRACTIONATED): Heparin Unfractionated: 0.6 IU/mL (ref 0.30–0.70)

## 2016-08-03 MED ORDER — WARFARIN SODIUM 5 MG PO TABS
5.0000 mg | ORAL_TABLET | Freq: Once | ORAL | Status: AC
  Administered 2016-08-03: 5 mg via ORAL
  Filled 2016-08-03: qty 1

## 2016-08-03 MED ORDER — METOPROLOL TARTRATE 25 MG PO TABS
25.0000 mg | ORAL_TABLET | Freq: Two times a day (BID) | ORAL | Status: DC
  Administered 2016-08-03 – 2016-08-08 (×7): 25 mg via ORAL
  Filled 2016-08-03: qty 1
  Filled 2016-08-03: qty 2
  Filled 2016-08-03 (×6): qty 1

## 2016-08-03 NOTE — Progress Notes (Addendum)
  [facility] Progress Note  Subjective   No complaints today.   Objective Vitals:   08/03/16 0444 08/03/16 0800  BP:  (!) 115/48  Pulse: 62 60  Resp: 19 (!) 28  Temp: 97.5 F (36.4 C)     Intake/Output Summary (Last 24 hours) at 08/03/16 0957 Last data filed at 08/03/16 0949  Gross per 24 hour  Intake              548 ml  Output             1100 ml  Net             -552 ml   Left foot warm with VAC seal intact.   Assessment/Planning: 82 y.o. male is s/p: I & D left great toe.  3 Days Post-Op   For VAC dressing change Tuesday.  Stable from vascular standpoint for d/c.   Alvia Grove 08/03/2016 9:57 AM   Laboratory CBC    Component Value Date/Time   WBC 11.9 (H) 08/03/2016 0430   HGB 7.9 (L) 08/03/2016 0430   HGB 14.1 11/22/2010 0953   HCT 25.9 (L) 08/03/2016 0430   HCT 42.6 11/22/2010 0953   PLT 309 08/03/2016 0430   PLT 223 11/22/2010 0953    BMET    Component Value Date/Time   NA 136 08/01/2016 0400   K 4.5 08/01/2016 0400   CL 96 (L) 08/01/2016 0400   CO2 31 08/01/2016 0400   GLUCOSE 154 (H) 08/01/2016 0400   GLUCOSE 229 08/07/2008 0000   BUN 35 (H) 08/01/2016 0400   CREATININE 0.98 08/01/2016 0400   CREATININE 1.17 03/19/2016 1534   CALCIUM 9.5 08/01/2016 0400   GFRNONAA >60 08/01/2016 0400   GFRAA >60 08/01/2016 0400    COAG Lab Results  Component Value Date   INR 1.98 08/03/2016   INR 1.66 08/02/2016   INR 1.30 07/30/2016   PROTIME 16.2 12/05/2008   No results found for: PTT  Antibiotics Anti-infectives    Start     Dose/Rate Route Frequency Ordered Stop   07/31/16 0954  ceFAZolin (ANCEF) 2-4 GM/100ML-% IVPB    Comments:  Fabian Sharp   : cabinet override      07/31/16 0954 07/31/16 2159   07/31/16 0700  ceFAZolin (ANCEF) IVPB 1 g/50 mL premix    Comments:  Send with pt to OR   1 g 100 mL/hr over 30 Minutes Intravenous To ShortStay Surgical 07/30/16 1023 07/31/16 [place],  PA-C [facility] Office: [phone] Pager: [phone] 08/03/2016 9:57 AM    I have independently interviewed patient and agree with PA assessment and plan above.   [place] Dragoo C. Donzetta Matters, MD [facility] [place] Office: [phone] Pager: [phone]

## 2016-08-03 NOTE — Progress Notes (Addendum)
PROGRESS NOTE  [name].  UQ:[id number] DOB: 08/27/1934  DOA: 07/24/2016 PCP: [name], MD   Brief Narrative:  82 year old male with PMH of severe AS s/p redo AVR (bioprosthetic valve), chronic diastolic CHF, CAD status post CABG, complete heart block s/p PPM, A. fib on chronic Coumadin, history of left atrial thrombus by TEE 7/12, PAD, COPD on home oxygen 2 L/m, type II DM, HTN, HLD, OSA not on CPAP, left foot osteomyelitis status post amputation of great toe, Alzheimer's dementia, recent hospitalization 1/11-1/29 for acute respiratory failure with hypoxia, noted to have a large right-sided pleural effusion for which she underwent thoracentesis, diabetic foot ulcer with osteomyelitis treated with antibiotics and then discharged to [facility] for 3 days, presented back to the ED following a fall on day of admission, found on floor. Also noted more lethargic than usual and worsening short-term memory. X-rays confirmed right humeral neck fracture. Hemoglobin 7.3. Vascular surgery consulted for left foot wound management-underwent I&D of left great toe on 2/6 and 2/8. Cardiology consulted for NSVT. Orthopedics consulted for right humerus fracture and recommended nonoperative management. Possible DC back to SNF 2/12 pending stability in hemoglobin.   Assessment & Plan:   Principal Problem:   Right humeral fracture Active Problems:   DM (diabetes mellitus) type II uncontrolled, periph vascular disorder ([name])   S/P AVR (aortic valve replacement)   Chronic atrial fibrillation ([name])   Chronic diastolic heart failure ([name])   Pleural effusion   Anemia   Supratherapeutic INR   Chronic respiratory failure ([place])   Fall   Alzheimer's dementia   Fracture of humerus neck, right, closed, initial encounter   NSVT (nonsustained ventricular tachycardia) ([place])   1. Right humeral neck fracture: Sustained status post fall at SNF. X-ray of right shoulder 07/24/16 confirmed displaced mildly  comminuted humeral neck fracture and no evidence of intra-articular extension. Continue right shoulder sling. Orthopedics consultation 2/11 appreciated: Confirmed right proximal humerus fracture of mainly the surgical neck which can be treated with just a sling and outpatient follow-up in 2 weeks.  2. Acute on chronic anemia: Hemoglobin 12 g in December 2017 but has progressively dropped and was 7.3 on admission. No overt bleeding reported. Status post 1 unit PRBC transfusion. Hemoglobin 8 g per DL on 2/5 but has been gradually drifting again since 2/3. Hemoglobin 7.5 on 2/8. Transfused additional 1 unit PRBC 2/8. Hemoglobin up to 8.2. Anemia panel reviewed: Iron 17, TIBC 300, saturation ratio 6, ferritin 59, folate 9.9, B12: 745 and reticulocytes 183. Picture suggestive of iron deficiency. Started iron supplements. Consider outpatient GI evaluation/screening colonoscopy if not done. Hemoglobin again dropped to 7.9 on 2/11. As per discussion with RN, has some bloody drainage from wound VAC in left toe which may be contributing. Also patient has been having BMs but FOBT not sent-reminded nursing to please send. Follow-up CBC in a.m. and if dropping further may need repeat transfusion.  3. Chronic A. fib: Presented with supratherapeutic INR of 3.6. Received vitamin K 1 mg on admission. Coumadin held. CHA2DS2VASc score is 6. INR 1.3. Metoprolol started by cardiology for NSVT which should help A. fib too. IV heparin bridging resumed and Coumadin per pharmacy. INR 1.98 on 2/11.  4. Right-sided pleural effusion, recurrent: Status post 2.1 L thoracentesis on 2/5 with improved follow-up chest x-ray. Transudative. X-ray 07/30/16: Poor film. Reports cardiomegaly and mild pulmonary edema. Lung bases poorly defined due to body habitus and overlying soft tissue. Saturating in the high 90s on 3 L/m  nasal cannula oxygen.  5. Leukocytosis: Mild and stable.  6. Hypotension: Initial low blood pressures of 93/48.  Antihypertensives held. Improved.  7. Chronic hypoxic respiratory failure: On home oxygen 2 L/m. Some concern for dysphagia on 2/8. Speech therapy evaluated 2/9 and recommend regular consistency diet and thin liquids.  8. Stage III chronic kidney disease: Stable.  9. Mild hyperkalemia: Resolved.  10. Chronic diastolic CHF: Clinically euvolemic. Continue oral Lasix.  11. CAD status post CABG: Asymptomatic of chest pain.  12. Complete heart block status post PPM: Monitor on telemetry which also shows episodes of NSVT.   13. Status post bioprosthetic AVR:  14. Type II DM: Reasonable inpatient control on SSI.  15. Essential hypertension: Management as above.  38. S/P angioplasty and stenting LLE and left great toe amputation: As per vascular surgery follow-up, underwent excisional debridement of left great toe amputation site on 2/6 night and repeat I&D in all OR on 2/8 followed by negative pressure dressing. Vascular surgery has seen and cleared for discharge.  17. Nonsustained VT: 2-D echo of/12/17: LVEF 55-60 percent, akinesis of the basal inferior myocardium severe pulmonary artery hypertension. Potassium >4. Magnesium 1.9 (was replaced 2/7). Continue monitoring on telemetry. Cardiology consultation appreciated. Started on metoprolol. 2-D echo 07/31/16: LVEF 55-60 percent, normal wall motion without regional wall motion abnormalities and severe pulmonary hypertension. Better but still having episodes of occasional NSVT and short runs of PVCs. Increase metoprolol to 25 MG twice a day.  18. Dementia: Moderate at least. Continue Aricept.  42. Adult failure to thrive: Apparently has been declining for some time now.? Consider palliative care consultation for goals of care >can be done at SNF..   DVT prophylaxis: Started IV heparin 07/30/16. Coumadin on hold since admission. Code Status: Full Family Communication: Discussed with spouse in detail, updated care and answered  questions. Disposition Plan: DC back to SNF when medically stable, possibly 08/04/16.   Consultants:   Vascular surgery  Interventional radiology  Cardiology   Orthopedics  Procedures:   Therapeutic right thoracentesis on 07/28/16  Left great toe amputation site debridement by vascular surgery on 07/29/16  I&D left great toe 07/31/16  2-D echo 07/31/16: Study Conclusions  - Left ventricle: The cavity size was normal. There was moderate   concentric hypertrophy. Systolic function was normal. The   estimated ejection fraction was in the range of 55% to 60%. Wall   motion was normal; there were no regional wall motion   abnormalities. The study is not technically sufficient to allow   evaluation of LV diastolic function. - Ventricular septum: Septal motion showed paradox. These changes   are consistent with RV pressure overload. - Aortic valve: A bioprosthesis was present and functioning   normally. Valve area (VTI): 1.44 cm^2. Valve area (Vmax): 1.45   cm^2. Valve area (Vmean): 1.48 cm^2. - Mitral valve: Severely calcified annulus. The findings are   consistent with mild stenosis. There was mild to moderate   regurgitation directed centrally. Valve area by continuity   equation (using LVOT flow): 1.35 cm^2. - Left atrium: The atrium was moderately to severely dilated. - Right ventricle: The cavity size was moderately dilated. Systolic   function was mildly to moderately reduced. - Tricuspid valve: There was moderate regurgitation directed   centrally. - Pulmonary arteries: Systolic pressure was severely increased. PA   peak pressure: 83 mm Hg (S).   Antimicrobials:   None    Subjective: Patient doesn't say much and hasn't had any complaints for the last several  days taking care of him. Denies pain, dyspnea. As per RN, had BM yesterday but stools not sent for FOBT and also had some bloody drainage from the wound VAC.  Objective:  Vitals:   08/02/16 2015 08/03/16 0405  08/03/16 0444 08/03/16 0800  BP:  (!) 114/48  (!) 115/48  Pulse: 62  62 60  Resp: (!) 24  19 (!) 28  Temp: 98.2 F (36.8 C)  97.5 F (36.4 C)   TempSrc: Oral  Oral   SpO2: 98%  97% 100%  Weight:   90.9 kg (200 lb 4.8 oz)   Height:        Intake/Output Summary (Last 24 hours) at 08/03/16 1046 Last data filed at 08/03/16 0949  Gross per 24 hour  Intake              548 ml  Output             1100 ml  Net             -552 ml   Filed Weights   08/01/16 0500 08/02/16 0603 08/03/16 0444  Weight: 87.1 kg (192 lb) 88.4 kg (194 lb 12.8 oz) 90.9 kg (200 lb 4.8 oz)    Examination:  General exam: Pleasant elderly male, moderately built and frail, chronically ill looking, lying comfortably propped up in bed. Respiratory system: Diminished breath sounds in the bases but otherwise clear to auscultation. Respiratory effort normal. Cardiovascular system: S1 & S2 heard, RRR. No JVD, murmurs, rubs, gallops or clicks. No pedal edema. Telemetry: V paced rhythm.  An episode of 5 beat NSVT and short run of 10 beat PVCs in the last 24 hours. Gastrointestinal system: Abdomen is nondistended, soft and nontender. No organomegaly or masses felt. Normal bowel sounds heard. Central nervous system: Alert and oriented 2. No focal neurological deficits. Extremities: Symmetric 5 x 5 power except right upper extremity which is immobilized in a sling.. Skin: Left great toe site wound VAC intact and draining slightly bloody fluid . Psychiatry: Judgement and insight impaired. Mood & affect appropriate.     Data Reviewed: I have personally reviewed following labs and imaging studies  CBC:  Recent Labs Lab 07/28/16 0302 07/30/16 1352 07/31/16 0400 08/01/16 0400 08/02/16 0643 08/03/16 0430  WBC 11.8* 9.9 11.1* 12.4* 10.9* 11.9*  NEUTROABS 9.4*  --   --   --   --   --   HGB 8.0* 8.2* 7.5* 8.2* 8.3* 7.9*  HCT 26.2* 27.7* 24.8* 26.7* 27.6* 25.9*  MCV 90.3 90.2 89.2 87.8 89.9 88.7  PLT 308 368 361 340 308  [id number]   Basic Metabolic Panel:  Recent Labs Lab 07/28/16 0302 07/29/16 0302 07/30/16 0825 07/30/16 1352 07/31/16 0400 08/01/16 0400  NA 136 139 138  --  135 136  K 5.4* 4.9 4.3  --  4.4 4.5  CL 98* 97* 97*  --  96* 96*  CO2 32 34* 35*  --  32 31  GLUCOSE 164* 158* 158*  --  148* 154*  BUN 54* 48* 37*  --  39* 35*  CREATININE 1.11 1.08 0.91  --  0.88 0.98  CALCIUM 9.5 9.7 9.7  --  9.3 9.5  MG  --   --   --  1.9  --   --    GFR: Estimated Creatinine Clearance: 69.3 mL/min (by C-G formula based on SCr of 0.98 mg/dL). Liver Function Tests: No results for input(s): AST, ALT, ALKPHOS, BILITOT, PROT, ALBUMIN in the last 168  hours. No results for input(s): LIPASE, AMYLASE in the last 168 hours. No results for input(s): AMMONIA in the last 168 hours. Coagulation Profile:  Recent Labs Lab 07/27/16 1140 07/28/16 0302 07/30/16 0825 08/02/16 0643 08/03/16 0430  INR 2.36 1.54 1.30 1.66 1.98   Cardiac Enzymes: No results for input(s): CKTOTAL, CKMB, CKMBINDEX, TROPONINI in the last 168 hours. BNP (last 3 results) No results for input(s): PROBNP in the last 8760 hours. HbA1C: No results for input(s): HGBA1C in the last 72 hours. CBG:  Recent Labs Lab 08/02/16 1201 08/02/16 1611 08/02/16 2014 08/03/16 0035 08/03/16 0808  GLUCAP 143* 187* 168* 126* 152*   Lipid Profile: No results for input(s): CHOL, HDL, LDLCALC, TRIG, CHOLHDL, LDLDIRECT in the last 72 hours. Thyroid Function Tests: No results for input(s): TSH, T4TOTAL, FREET4, T3FREE, THYROIDAB in the last 72 hours. Anemia Panel:  Recent Labs  07/31/16 1503  VITAMINB12 745  FOLATE 9.9  FERRITIN 59  TIBC 300  IRON 17*  RETICCTPCT 6.4*    Sepsis Labs:  Recent Labs Lab 07/31/16 0400 08/01/16 0400 08/02/16 0643 08/03/16 0430  WBC 11.1* 12.4* 10.9* 11.9*    Recent Results (from the past 240 hour(s))  MRSA PCR Screening     Status: None   Collection Time: 07/25/16  5:28 AM  Result Value Ref Range Status    MRSA by PCR NEGATIVE NEGATIVE Final    Comment:        The GeneXpert MRSA Assay (FDA approved for NASAL specimens only), is one component of a comprehensive MRSA colonization surveillance program. It is not intended to diagnose MRSA infection nor to guide or monitor treatment for MRSA infections.   Culture, body fluid-bottle     Status: None   Collection Time: 07/28/16  3:09 PM  Result Value Ref Range Status   Specimen Description FLUID RIGHT PLEURAL  Final   Special Requests BOTTLES DRAWN AEROBIC AND ANAEROBIC 10CC  Final   Culture NO GROWTH 5 DAYS  Final   Report Status 08/02/2016 FINAL  Final  Gram stain     Status: None   Collection Time: 07/28/16  3:09 PM  Result Value Ref Range Status   Specimen Description FLUID RIGHT PLEURAL  Final   Special Requests NONE  Final   Gram Stain   Final    RARE WBC PRESENT, PREDOMINANTLY PMN NO ORGANISMS SEEN    Report Status 07/28/2016 FINAL  Final         Radiology Studies: No results found.      Scheduled Meds: . aspirin EC  81 mg Oral Daily  . docusate sodium  100 mg Oral BID  . donepezil  10 mg Oral QHS  . ezetimibe-simvastatin  1 tablet Oral QHS  . ferrous sulfate  325 mg Oral BID WC  . furosemide  40 mg Oral Daily  . insulin aspart  0-9 Units Subcutaneous TID WC  . metoprolol tartrate  12.5 mg Oral BID  . nystatin   Topical TID  . warfarin  5 mg Oral ONCE-1800  . Warfarin - Pharmacist Dosing Inpatient   Does not apply q1800   Continuous Infusions: . heparin 1,200 Units/hr (08/03/16 0744)     LOS: 10 days        [facility], MD [facility] Pager [phone]  If 7PM-7AM, please contact night-coverage [email or web address] Password [facility] 08/03/2016, 10:46 AM

## 2016-08-03 NOTE — Progress Notes (Signed)
[place] for Heparin AND Coumadin Indication: atrial fibrillation  Allergies  Allergen Reactions  . Potassium-Containing Compounds Anaphylaxis    IV--loss of conciousness  . Metoprolol Succinate Other (See Comments)    Extremely tired    Patient Measurements: Height: 5\' 10"  (177.8 cm) Weight: 200 lb 4.8 oz (90.9 kg) IBW/kg (Calculated) : 73 Heparin Dosing Weight:  73 kg  Vital Signs: Temp: 97.5 F (36.4 C) (02/11 0444) Temp Source: Oral (02/11 0444) BP: 114/48 (02/11 0405) Pulse Rate: 62 (02/11 0444)  Labs:  Recent Labs  08/01/16 0400 08/01/16 1830 08/02/16 0643 08/03/16 0430  HGB 8.2*  --  8.3* 7.9*  HCT 26.7*  --  27.6* 25.9*  PLT 340  --  308 309  LABPROT  --   --  19.8* 22.8*  INR  --   --  1.66 1.98  HEPARINUNFRC <0.10* 0.33 0.45 0.60  CREATININE 0.98  --   --   --     Estimated Creatinine Clearance: 69.3 mL/min (by C-G formula based on SCr of 0.98 mg/dL).  Assessment:  Anticoag: Heparin for AFib, while warfarin on hold (received vitamin K on 2/4). S/P toe amp 2/8 and blood transfusion. Heparin and warfarin were resumed 2/9 after procedure.   Heparin level is therapeutic today at 0.60. Hemoglobin is low stable at 7.9 in setting of anemia. Mild bleeding noted by RN from wound. INR remains subtherapeutic at 1.98 after two doses of warfarin. Patient does not appear to be eating well.   Will give small boost dose again tonight.   PTA warfarin dose: 4.5 mg daily  Goal of Therapy:  Heparin level 0.3-0.7 units/ml  INR 2-3 Monitor platelets by anticoagulation protocol: Yes    Plan:  -Continue heparin at 1200 units/hr -Warfarin 5mg  PO x1 again tonight  -Monitor for signs/symptoms of bleeding -Daily HL, CBC, INR  Demetrius Charity, PharmD Acute Care Pharmacy Resident  Pager: [phone] 08/03/2016

## 2016-08-03 NOTE — Consult Note (Signed)
  Dr. Algis Liming asked [place] to come by to evaluate the patient's right proximal humerus fracture.  He is someone who was recently admitted due to complications of peripheral vascular disease.  Prior to admission, he sustained a mechanical fall and was found to have a right proximal humerus fracture.  He has been treated appropriately in a sling.  On exam, the right shoulder is clinically well located and he has the appropriate pain on exam.  There is bruising to be expected with this type of injury.  His right hand is well perfused.  I have independently reviewed the x-rays and agree that he has a right proximal humerus fracture of mainly the surgical neck.  This can be treated in just a sling.  We can see him in follow-up in 2 weeks.

## 2016-08-04 DIAGNOSIS — D649 Anemia, unspecified: Secondary | ICD-10-CM

## 2016-08-04 LAB — BASIC METABOLIC PANEL
ANION GAP: 7 (ref 5–15)
BUN: 29 mg/dL — ABNORMAL HIGH (ref 6–20)
CHLORIDE: 98 mmol/L — AB (ref 101–111)
CO2: 32 mmol/L (ref 22–32)
Calcium: 9.4 mg/dL (ref 8.9–10.3)
Creatinine, Ser: 0.89 mg/dL (ref 0.61–1.24)
GFR calc non Af Amer: >60 mL/min (ref >60)
GLUCOSE: 172 mg/dL — AB (ref 65–99)
POTASSIUM: 4.4 mmol/L (ref 3.5–5.1)
Sodium: 137 mmol/L (ref 135–145)

## 2016-08-04 LAB — GLUCOSE, CAPILLARY
GLUCOSE-CAPILLARY: 147 mg/dL — AB (ref 65–99)
GLUCOSE-CAPILLARY: 168 mg/dL — AB (ref 65–99)
GLUCOSE-CAPILLARY: 162 mg/dL — AB (ref 65–99)
Glucose-Capillary: 151 mg/dL — ABNORMAL HIGH (ref 65–99)

## 2016-08-04 LAB — CBC
HEMATOCRIT: 25.7 % — AB (ref 39.0–52.0)
HEMOGLOBIN: 7.6 g/dL — AB (ref 13.0–17.0)
MCH: 26.3 pg (ref 26.0–34.0)
MCHC: 29.6 g/dL — AB (ref 30.0–36.0)
MCV: 88.9 fL (ref 78.0–100.0)
Platelets: 292 10*3/uL (ref 150–400)
RBC: 2.89 MIL/uL — AB (ref 4.22–5.81)
RDW: 15.7 % — ABNORMAL HIGH (ref 11.5–15.5)
WBC: 11.5 10*3/uL — ABNORMAL HIGH (ref 4.0–10.5)

## 2016-08-04 LAB — MAGNESIUM: MAGNESIUM: 1.8 mg/dL (ref 1.7–2.4)

## 2016-08-04 LAB — PREPARE RBC (CROSSMATCH)

## 2016-08-04 LAB — PROTIME-INR
INR: 2.89
Prothrombin Time: 30.8 seconds — ABNORMAL HIGH (ref 11.4–15.2)

## 2016-08-04 LAB — HEPARIN LEVEL (UNFRACTIONATED): Heparin Unfractionated: 0.59 IU/mL (ref 0.30–0.70)

## 2016-08-04 MED ORDER — MAGNESIUM SULFATE 2 GM/50ML IV SOLN
2.0000 g | Freq: Once | INTRAVENOUS | Status: AC
  Administered 2016-08-04: 2 g via INTRAVENOUS
  Filled 2016-08-04: qty 50

## 2016-08-04 MED ORDER — WARFARIN SODIUM 2.5 MG PO TABS
2.5000 mg | ORAL_TABLET | Freq: Once | ORAL | Status: AC
  Administered 2016-08-04: 2.5 mg via ORAL
  Filled 2016-08-04: qty 1

## 2016-08-04 MED ORDER — SODIUM CHLORIDE 0.9 % IV SOLN: Freq: Once | INTRAVENOUS | Status: DC

## 2016-08-04 MED ORDER — FUROSEMIDE 10 MG/ML IJ SOLN
20.0000 mg | Freq: Once | INTRAMUSCULAR | Status: AC
  Administered 2016-08-04: 20 mg via INTRAVENOUS
  Filled 2016-08-04: qty 2

## 2016-08-04 NOTE — Clinical Social Work Note (Signed)
CSW spoke with wife today regarding discharge plan. Wife states that she is not happy with the list of other options available to the patient and would want the patient to return to [facility]. CSW has confirmed with [facility] that the facility is able to manage the patient's need for a wound vac. Facility shares that they are low on beds and that CSW will have to check with facility day by day to confirm that the patient can admit.   Liz Beach MSW, [place], [place], [phone]

## 2016-08-04 NOTE — Progress Notes (Signed)
Progress Note  Patient Name: [name]. Date of Encounter: 08/04/2016  Primary Cardiologist: Dr. Aundra Dubin  Subjective   No complaints  Inpatient Medications    Scheduled Meds: . aspirin EC  81 mg Oral Daily  . docusate sodium  100 mg Oral BID  . donepezil  10 mg Oral QHS  . ezetimibe-simvastatin  1 tablet Oral QHS  . ferrous sulfate  325 mg Oral BID WC  . furosemide  40 mg Oral Daily  . insulin aspart  0-9 Units Subcutaneous TID WC  . metoprolol tartrate  25 mg Oral BID  . nystatin   Topical TID  . Warfarin - Pharmacist Dosing Inpatient   Does not apply q1800   Continuous Infusions: . heparin 1,200 Units/hr (08/04/16 0100)   PRN Meds: acetaminophen **OR** acetaminophen, albuterol, ipratropium, ondansetron **OR** ondansetron (ZOFRAN) IV   Vital Signs    Vitals:   08/03/16 2138 08/03/16 2139 08/04/16 0140 08/04/16 0529  BP: (!) 115/55 (!) 115/55 108/67 122/66  Pulse: 64 60 (!) 59 (!) 57  Resp: (!) 22 (!) 21 (!) 31 20  Temp: 97.2 F (36.2 C)   97.3 F (36.3 C)  TempSrc: Oral   Oral  SpO2: 96% 96% 91% 96%  Weight:    178 lb 11.2 oz (81.1 kg)  Height:        Intake/Output Summary (Last 24 hours) at 08/04/16 0803 Last data filed at 08/04/16 0534  Gross per 24 hour  Intake              860 ml  Output              750 ml  Net              110 ml   Filed Weights   08/02/16 0603 08/03/16 0444 08/04/16 0529  Weight: 194 lb 12.8 oz (88.4 kg) 200 lb 4.8 oz (90.9 kg) 178 lb 11.2 oz (81.1 kg)    Telemetry    Still with bursts of NSVT otherwise pacing   Personally Reviewed  ECG    07/24/16 v pacing no new EKGs since.  - Personally Reviewed  Physical Exam  *GEN: No acute distress.   Neck: No JVD Cardiac: RRR, no murmurs, rubs, or gallops.  Respiratory: Clear to auscultation bilaterally, ant.. GI: Soft, nontender, non-distended  MS: No edema; Lt gt toe amputation with drain, eschar at site Neuro:  Nonfocal  Psych: Normal affect   Labs     Chemistry Recent Labs Lab 07/30/16 0825 07/31/16 0400 08/01/16 0400  NA 138 135 136  K 4.3 4.4 4.5  CL 97* 96* 96*  CO2 35* 32 31  GLUCOSE 158* 148* 154*  BUN 37* 39* 35*  CREATININE 0.91 0.88 0.98  CALCIUM 9.7 9.3 9.5  GFRNONAA >60 >60 >60  GFRAA >60 >60 >60  ANIONGAP 6 7 9      Hematology Recent Labs Lab 08/01/16 0400 08/02/16 0643 08/03/16 0430  WBC 12.4* 10.9* 11.9*  RBC 3.04* 3.07* 2.92*  HGB 8.2* 8.3* 7.9*  HCT 26.7* 27.6* 25.9*  MCV 87.8 89.9 88.7  MCH 27.0 27.0 27.1  MCHC 30.7 30.1 30.5  RDW 15.8* 15.8* 15.5  PLT 340 308 309    Cardiac EnzymesNo results for input(s): TROPONINI in the last 168 hours. No results for input(s): TROPIPOC in the last 168 hours.   BNPNo results for input(s): BNP, PROBNP in the last 168 hours.   DDimer No results for input(s): DDIMER in the last  168 hours.   Radiology    No results found.  Cardiac Studies   Echo 07/31/16 Study Conclusions  - Left ventricle: The cavity size was normal. There was moderate   concentric hypertrophy. Systolic function was normal. The   estimated ejection fraction was in the range of 55% to 60%. Wall   motion was normal; there were no regional wall motion   abnormalities. The study is not technically sufficient to allow   evaluation of LV diastolic function. - Ventricular septum: Septal motion showed paradox. These changes   are consistent with RV pressure overload. - Aortic valve: A bioprosthesis was present and functioning   normally. Valve area (VTI): 1.44 cm^2. Valve area (Vmax): 1.45   cm^2. Valve area (Vmean): 1.48 cm^2. - Mitral valve: Severely calcified annulus. The findings are   consistent with mild stenosis. There was mild to moderate   regurgitation directed centrally. Valve area by continuity   equation (using LVOT flow): 1.35 cm^2. - Left atrium: The atrium was moderately to severely dilated. - Right ventricle: The cavity size was moderately dilated. Systolic   function was  mildly to moderately reduced. - Tricuspid valve: There was moderate regurgitation directed   centrally. - Pulmonary arteries: Systolic pressure was severely increased. PA   peak pressure: 83 mm Hg (S).   Patient Profile     82 y.o. male with a PMH significant for diastolic heart failure, AS s/p AVR, CAD s/p CABG, sick sinus syndrome and complete heart block s/p PPM, atrial fibrillation on coumadin with hx of left atrial thrombus in 2012, PVD, COPD on home oxygen, DM, s/p left foot osteomyelitis s/p amputation (06/2016), and dementia. He presented to the ED after a fall and found to have a right humeral neck fracture. Cardiology was consulted for nonsustained VT on telemetry.   Assessment & Plan    1. NSVT  Continues with short bursts. Recheck BMP and Magnesium  2. A fib/SSS/CHB/  PPM Cha2DS2VASc score of 6, on ASA coumadin was on hold with recent procedures but has been resumed.  Hgb is 7.9  INR yesterday 1.98  3.  Chronic diastolic HF  With pulmonary HTN.  4.  CAD hx CABG no chest pain  5.  Anemia yesterday hgb 7.9  Dr. Angelena Form has recommended transfusing below 8  6.  Lt foot osteomyelitis with amputation followed by Vascular and IM  7. Rt humeral neck fracture- treated with sling.   Signed, Cecilie Kicks, NP  08/04/2016, 8:03 AM    I have examined the patient and reviewed assessment and plan and discussed with patient.  Agree with above as stated.  Frail appearing elderly gentleman.  Predominantly paced rhythm.  It appears arrhythmia burden is decreasing.  Titrate metoprolol as tolerated ad as needed.  He is not having any palpitations at this time.   Larae Grooms

## 2016-08-04 NOTE — Progress Notes (Signed)
PROGRESS NOTE  [name].  UQ:[id number] DOB: 1934/08/24  DOA: 07/24/2016 PCP: [name], MD   Brief Narrative:  82 year old male with PMH of severe AS s/p redo AVR (bioprosthetic valve), chronic diastolic CHF, CAD status post CABG, complete heart block s/p PPM, A. fib on chronic Coumadin, history of left atrial thrombus by TEE 7/12, PAD, COPD on home oxygen 2 L/m, type II DM, HTN, HLD, OSA not on CPAP, left foot osteomyelitis status post amputation of great toe, Alzheimer's dementia, recent hospitalization 1/11-1/29 for acute respiratory failure with hypoxia, noted to have a large right-sided pleural effusion for which she underwent thoracentesis, diabetic foot ulcer with osteomyelitis treated with antibiotics and then discharged to [facility] for 3 days, presented back to the ED following a fall on day of admission, found on floor. Also noted more lethargic than usual and worsening short-term memory. X-rays confirmed right humeral neck fracture. Hemoglobin 7.3. Vascular surgery consulted for left foot wound management-underwent I&D of left great toe on 2/6 and 2/8. Cardiology consulted for NSVT. Orthopedics consulted for right humerus fracture and recommended nonoperative management. Possible DC back to SNF 2/13 s/p 2 units PRBC's on 2/12 and stable Hb.   Assessment & Plan:   Principal Problem:   Right humeral fracture Active Problems:   DM (diabetes mellitus) type II uncontrolled, periph vascular disorder ([name])   S/P AVR (aortic valve replacement)   Chronic atrial fibrillation ([name])   Chronic diastolic heart failure ([name])   Pleural effusion   Anemia   Supratherapeutic INR   Chronic respiratory failure ([place])   Fall   Alzheimer's dementia   Fracture of humerus neck, right, closed, initial encounter   NSVT (nonsustained ventricular tachycardia) ([place])   1. Right humeral neck fracture: Sustained status post fall at SNF. X-ray of right shoulder 07/24/16 confirmed displaced mildly  comminuted humeral neck fracture and no evidence of intra-articular extension. Continue right shoulder sling. Orthopedics consultation appreciated: Confirmed right proximal humerus fracture of mainly the surgical neck which can be treated with just a sling. Outpatient follow-up with Dr. Jean Rosenthal in 2 weeks.  2. Acute on chronic anemia: Hemoglobin 12 g in December 2017 but has progressively dropped and was 7.3 on admission. Since admission, despite 2 units of PRBCs, hemoglobin has been gradually drifting down without overt bleeding except from wound vac at left great toe I&D site. FOBT has been requested but sample apparently not sent. Anemia panel reviewed: Iron 17, TIBC 300, saturation ratio 6, ferritin 59, folate 9.9, B12: 745 and reticulocytes 183. Picture suggestive of iron deficiency. Started iron supplements. Consider outpatient GI evaluation/screening colonoscopy if not done. Anemia multifactorial secondary to chronic disease, iron deficiency and blood loss from I&D site of left great toe. Hemoglobin down to 7.6 on 2/12. Transfuse 2 units PRBCs and her Lasix cover. Follow CBC in a.m. Monitor CBCs closely at SNF.  3. Chronic A. fib: Presented with supratherapeutic INR of 3.6. Received vitamin K 1 mg on admission. Coumadin held. CHA2DS2VASc score is 6. Metoprolol started by cardiology for NSVT which should help A. fib too. IV heparin bridging resumed and Coumadin per pharmacy. INR therapeutic at 2.89 on 2/12. Discontinue IV heparin bridge.  4. Right-sided pleural effusion, recurrent: Status post 2.1 L thoracentesis on 2/5 with improved follow-up chest x-ray. Transudative. X-ray 07/30/16: Poor film. Reports cardiomegaly and mild pulmonary edema. Lung bases poorly defined due to body habitus and overlying soft tissue. Saturating in the high 90s on 3 L/m nasal cannula oxygen.  5. Leukocytosis: Mild and stable.  6. Hypotension: Initial low blood pressures of 93/48. Antihypertensives held.  Improved.  7. Chronic hypoxic respiratory failure: On home oxygen 2 L/m. Some concern for dysphagia on 2/8. Speech therapy evaluated 2/9 and recommend regular consistency diet and thin liquids.  8. Stage III chronic kidney disease: Stable.  9. Mild hyperkalemia: Resolved.  10. Chronic diastolic CHF: Clinically euvolemic. Continue oral Lasix.  11. CAD status post CABG: Asymptomatic of chest pain.  12. Complete heart block status post PPM: Monitor on telemetry which also showed episodes of NSVT.   13. Status post bioprosthetic AVR:  14. Type II DM: Reasonable inpatient control on SSI.  15. Essential hypertension: Management as above.  53. S/P angioplasty and stenting LLE and left great toe amputation: As per vascular surgery follow-up, underwent excisional debridement of left great toe amputation site on 2/6 night and repeat I&D in all OR on 2/8 followed by negative pressure dressing. Vascular surgery has seen and cleared for discharge. Outpatient follow-up with vascular surgery.  17. Nonsustained VT: Monitored on telemetry. Cardiology consultation and follow-up appreciated. Started on metoprolol. 2-D echo 07/31/16: LVEF 55-60 percent, normal wall motion without regional wall motion abnormalities and severe pulmonary hypertension. Metoprolol started and Increased metoprolol to 25 MG twice a day. NSVT burden has decreased. Potassium 4.4. Magnesium 1.8. Replace to keep >2.  18. Dementia: Moderate at least. Continue Aricept.  55. Adult failure to thrive: Apparently has been declining for some time now.? Consider palliative care consultation for goals of care >can be done at SNF..   DVT prophylaxis: Discontinue IV heparin bridge 2/13. Continue Coumadin per pharmacy. Code Status: Full Family Communication: No family at bedside. Disposition Plan: DC back to SNF when medically stable, possibly 08/05/16.   Consultants:   Vascular surgery  Interventional radiology  Cardiology    Orthopedics  Procedures:   Therapeutic right thoracentesis on 07/28/16  Left great toe amputation site debridement by vascular surgery on 07/29/16  I&D left great toe 07/31/16  2-D echo 07/31/16: Study Conclusions  - Left ventricle: The cavity size was normal. There was moderate   concentric hypertrophy. Systolic function was normal. The   estimated ejection fraction was in the range of 55% to 60%. Wall   motion was normal; there were no regional wall motion   abnormalities. The study is not technically sufficient to allow   evaluation of LV diastolic function. - Ventricular septum: Septal motion showed paradox. These changes   are consistent with RV pressure overload. - Aortic valve: A bioprosthesis was present and functioning   normally. Valve area (VTI): 1.44 cm^2. Valve area (Vmax): 1.45   cm^2. Valve area (Vmean): 1.48 cm^2. - Mitral valve: Severely calcified annulus. The findings are   consistent with mild stenosis. There was mild to moderate   regurgitation directed centrally. Valve area by continuity   equation (using LVOT flow): 1.35 cm^2. - Left atrium: The atrium was moderately to severely dilated. - Right ventricle: The cavity size was moderately dilated. Systolic   function was mildly to moderately reduced. - Tricuspid valve: There was moderate regurgitation directed   centrally. - Pulmonary arteries: Systolic pressure was severely increased. PA   peak pressure: 83 mm Hg (S).   Antimicrobials:   None    Subjective: Patient doesn't say much and hasn't had any complaints for the last several days taking care of him. Denies pain, dyspnea. As per RN, had dark BM due to iron supplements.  Objective:  Vitals:   08/03/16  2139 08/04/16 0140 08/04/16 0529 08/04/16 0800  BP: (!) 115/55 108/67 122/66   Pulse: 60 (!) 59 (!) 57 62  Resp: (!) 21 (!) 31 20 (!) 23  Temp:   97.3 F (36.3 C)   TempSrc:   Oral   SpO2: 96% 91% 96% 95%  Weight:   81.1 kg (178 lb 11.2 oz)    Height:        Intake/Output Summary (Last 24 hours) at 08/04/16 1116 Last data filed at 08/04/16 0900  Gross per 24 hour  Intake              900 ml  Output              750 ml  Net              150 ml   Filed Weights   08/02/16 0603 08/03/16 0444 08/04/16 0529  Weight: 88.4 kg (194 lb 12.8 oz) 90.9 kg (200 lb 4.8 oz) 81.1 kg (178 lb 11.2 oz)    Examination:  General exam: Pleasant elderly male, moderately built and frail, chronically ill looking, lying comfortably propped up in bed. Respiratory system: Diminished breath sounds in the bases but otherwise clear to auscultation. Respiratory effort normal. Cardiovascular system: S1 & S2 heard, RRR. No JVD, murmurs, rubs, gallops or clicks. No pedal edema. Telemetry: V paced rhythm.  3-5 beat episodes of NSVT 2 in the last 24 hours. Gastrointestinal system: Abdomen is nondistended, soft and nontender. No organomegaly or masses felt. Normal bowel sounds heard. Central nervous system: Alert and oriented 2. No focal neurological deficits. Extremities: Symmetric 5 x 5 power except right upper extremity which is immobilized in a sling.. Skin: Left great toe site wound VAC intact and draining slightly bloody fluid . Psychiatry: Judgement and insight impaired. Mood & affect appropriate.     Data Reviewed: I have personally reviewed following labs and imaging studies  CBC:  Recent Labs Lab 07/31/16 0400 08/01/16 0400 08/02/16 0643 08/03/16 0430 08/04/16 1032  WBC 11.1* 12.4* 10.9* 11.9* 11.5*  HGB 7.5* 8.2* 8.3* 7.9* 7.6*  HCT 24.8* 26.7* 27.6* 25.9* 25.7*  MCV 89.2 87.8 89.9 88.7 88.9  PLT 361 340 308 309 [id number]   Basic Metabolic Panel:  Recent Labs Lab 07/29/16 0302 07/30/16 0825 07/30/16 1352 07/31/16 0400 08/01/16 0400  NA 139 138  --  135 136  K 4.9 4.3  --  4.4 4.5  CL 97* 97*  --  96* 96*  CO2 34* 35*  --  32 31  GLUCOSE 158* 158*  --  148* 154*  BUN 48* 37*  --  39* 35*  CREATININE 1.08 0.91  --  0.88 0.98   CALCIUM 9.7 9.7  --  9.3 9.5  MG  --   --  1.9  --   --    GFR: Estimated Creatinine Clearance: 63.1 mL/min (by C-G formula based on SCr of 0.98 mg/dL). Liver Function Tests: No results for input(s): AST, ALT, ALKPHOS, BILITOT, PROT, ALBUMIN in the last 168 hours. No results for input(s): LIPASE, AMYLASE in the last 168 hours. No results for input(s): AMMONIA in the last 168 hours. Coagulation Profile:  Recent Labs Lab 07/30/16 0825 08/02/16 0643 08/03/16 0430 08/04/16 1032  INR 1.30 1.66 1.98 2.89   Cardiac Enzymes: No results for input(s): CKTOTAL, CKMB, CKMBINDEX, TROPONINI in the last 168 hours. BNP (last 3 results) No results for input(s): PROBNP in the last 8760 hours. HbA1C: No results for input(s): HGBA1C  in the last 72 hours. CBG:  Recent Labs Lab 08/03/16 0808 08/03/16 1156 08/03/16 1655 08/03/16 2025 08/04/16 0724  GLUCAP 152* 161* 111* 162* 151*   Lipid Profile: No results for input(s): CHOL, HDL, LDLCALC, TRIG, CHOLHDL, LDLDIRECT in the last 72 hours. Thyroid Function Tests: No results for input(s): TSH, T4TOTAL, FREET4, T3FREE, THYROIDAB in the last 72 hours. Anemia Panel: No results for input(s): VITAMINB12, FOLATE, FERRITIN, TIBC, IRON, RETICCTPCT in the last 72 hours.  Sepsis Labs:  Recent Labs Lab 08/01/16 0400 08/02/16 0643 08/03/16 0430 08/04/16 1032  WBC 12.4* 10.9* 11.9* 11.5*    Recent Results (from the past 240 hour(s))  Culture, body fluid-bottle     Status: None   Collection Time: 07/28/16  3:09 PM  Result Value Ref Range Status   Specimen Description FLUID RIGHT PLEURAL  Final   Special Requests BOTTLES DRAWN AEROBIC AND ANAEROBIC 10CC  Final   Culture NO GROWTH 5 DAYS  Final   Report Status 08/02/2016 FINAL  Final  Gram stain     Status: None   Collection Time: 07/28/16  3:09 PM  Result Value Ref Range Status   Specimen Description FLUID RIGHT PLEURAL  Final   Special Requests NONE  Final   Gram Stain   Final    RARE  WBC PRESENT, PREDOMINANTLY PMN NO ORGANISMS SEEN    Report Status 07/28/2016 FINAL  Final         Radiology Studies: No results found.      Scheduled Meds: . sodium chloride   Intravenous Once  . aspirin EC  81 mg Oral Daily  . docusate sodium  100 mg Oral BID  . donepezil  10 mg Oral QHS  . ezetimibe-simvastatin  1 tablet Oral QHS  . ferrous sulfate  325 mg Oral BID WC  . furosemide  20 mg Intravenous Once  . furosemide  40 mg Oral Daily  . insulin aspart  0-9 Units Subcutaneous TID WC  . metoprolol tartrate  25 mg Oral BID  . nystatin   Topical TID  . Warfarin - Pharmacist Dosing Inpatient   Does not apply q1800   Continuous Infusions: . heparin 1,200 Units/hr (08/04/16 0100)     LOS: 11 days        Fabyan Loughmiller, MD [facility] Pager [phone]  If 7PM-7AM, please contact night-coverage [email or web address] Password [facility] 08/04/2016, 11:16 AM

## 2016-08-04 NOTE — Progress Notes (Addendum)
[facility] for Coumadin Indication: atrial fibrillation  Allergies  Allergen Reactions  . Potassium-Containing Compounds Anaphylaxis    IV--loss of conciousness  . Metoprolol Succinate Other (See Comments)    Extremely tired    Patient Measurements: Height: 5\' 10"  (177.8 cm) Weight: 178 lb 11.2 oz (81.1 kg) (SCDs and Woundvac held off the bed when patient weighed) IBW/kg (Calculated) : 73 Heparin Dosing Weight:  73 kg  Vital Signs: Temp: 97.3 F (36.3 C) (02/12 0529) Temp Source: Oral (02/12 0529) BP: 122/66 (02/12 0529) Pulse Rate: 62 (02/12 0800)  Labs:  Recent Labs  08/02/16 0643 08/03/16 0430 08/04/16 1032  HGB 8.3* 7.9* 7.6*  HCT 27.6* 25.9* 25.7*  PLT 308 309 292  LABPROT 19.8* 22.8* 30.8*  INR 1.66 1.98 2.89  HEPARINUNFRC 0.45 0.60 0.59  CREATININE  --   --  0.89    Estimated Creatinine Clearance: 69.5 mL/min (by C-G formula based on SCr of 0.89 mg/dL).  Assessment: 83 year old male with PMH of severe AS s/p redo AVR (bioprosthetic valve) on chronic coumadin PTA for Afib.  Presented with supratherapeutic INR of 3.6. Received vitamin K 1 mg on admission & coumadin held.  S/P left great toe amputation , angioplasty and stenting LLE  on 2/8 and blood transfusion. Heparin bridging and warfarin were resumed 2/9 after procedure.    Heparin level is therapeutic 0.59 on IV heparin 1200 units/hr .  INR 2.89, therapeutic.   INR increased from 1.98 yesterday. MD has discontinued the heparin this AM as INR is therapeutic Hemoglobin gradually drifting down, = 7.6 in setting acute on chronic anemia, without overt bleeding except from wound vac at left great toe I&D site. Pltc wnl.  MD started iron supplements for iron deficiency anemia per anemia/iron panel. MD plan to Transfuse 2 units PRBCs , CBC in AM   PTA warfarin dose: 4.5 mg daily (INR 3.6 on admit)  Goal of Therapy:  Heparin level 0.3-0.7 units/ml  INR 2-3 Monitor platelets  by anticoagulation protocol: Yes    Plan:  -IV heparin DC'd by MD today.  -Warfarin 2.5mg  PO x1 again tonight  -Monitor for signs/symptoms of bleeding -Daily  CBC, INR  Nicole Cella, RPh Clinical Pharmacist 8A-4P [phone] 4P-10P [phone] [place] [phone] 08/04/2016 12:00 PM

## 2016-08-05 DIAGNOSIS — R195 Other fecal abnormalities: Secondary | ICD-10-CM

## 2016-08-05 DIAGNOSIS — D500 Iron deficiency anemia secondary to blood loss (chronic): Secondary | ICD-10-CM

## 2016-08-05 DIAGNOSIS — I493 Ventricular premature depolarization: Secondary | ICD-10-CM

## 2016-08-05 DIAGNOSIS — S42211D Unspecified displaced fracture of surgical neck of right humerus, subsequent encounter for fracture with routine healing: Secondary | ICD-10-CM

## 2016-08-05 LAB — CBC
HCT: 31.2 % — ABNORMAL LOW (ref 39.0–52.0)
HCT: 31.3 % — ABNORMAL LOW (ref 39.0–52.0)
HEMOGLOBIN: 9.7 g/dL — AB (ref 13.0–17.0)
MCH: 27 pg (ref 26.0–34.0)
MCH: 27.2 pg (ref 26.0–34.0)
MCHC: 30.8 g/dL (ref 30.0–36.0)
MCHC: 31 g/dL (ref 30.0–36.0)
MCV: 87.6 fL (ref 78.0–100.0)
MCV: 87.9 fL (ref 78.0–100.0)
PLATELETS: 260 10*3/uL (ref 150–400)
Platelets: 251 10*3/uL (ref 150–400)
RBC: 3.56 MIL/uL — ABNORMAL LOW (ref 4.22–5.81)
RBC: 3.56 MIL/uL — ABNORMAL LOW (ref 4.22–5.81)
RDW: 15.5 % (ref 11.5–15.5)
RDW: 15.5 % (ref 11.5–15.5)
WBC: 12.8 10*3/uL — ABNORMAL HIGH (ref 4.0–10.5)
WBC: 14.9 10*3/uL — ABNORMAL HIGH (ref 4.0–10.5)

## 2016-08-05 LAB — TYPE AND SCREEN
ABO/RH(D): B POS
Antibody Screen: NEGATIVE
UNIT DIVISION: 0
Unit division: 0

## 2016-08-05 LAB — GLUCOSE, CAPILLARY
GLUCOSE-CAPILLARY: 167 mg/dL — AB (ref 65–99)
Glucose-Capillary: 142 mg/dL — ABNORMAL HIGH (ref 65–99)
Glucose-Capillary: 157 mg/dL — ABNORMAL HIGH (ref 65–99)
Glucose-Capillary: 141 mg/dL — ABNORMAL HIGH (ref 65–99)

## 2016-08-05 LAB — OCCULT BLOOD X 1 CARD TO LAB, STOOL: Fecal Occult Bld: POSITIVE — AB

## 2016-08-05 LAB — PROTIME-INR
INR: 3.07
PROTHROMBIN TIME: 32.3 s — AB (ref 11.4–15.2)

## 2016-08-05 MED ORDER — WARFARIN SODIUM 2 MG PO TABS
2.0000 mg | ORAL_TABLET | Freq: Once | ORAL | Status: AC
  Administered 2016-08-05: 2 mg via ORAL
  Filled 2016-08-05: qty 1

## 2016-08-05 MED ORDER — PANTOPRAZOLE SODIUM 40 MG PO TBEC
40.0000 mg | DELAYED_RELEASE_TABLET | Freq: Every day | ORAL | Status: DC
  Administered 2016-08-06 – 2016-08-07 (×2): 40 mg via ORAL
  Filled 2016-08-05 (×2): qty 1

## 2016-08-05 NOTE — Progress Notes (Signed)
Progress Note  Patient Name: [name]. Date of Encounter: 08/05/2016  Primary Cardiologist: Dr. Aundra Dubin  Subjective   No complaints  Inpatient Medications    Scheduled Meds: . sodium chloride   Intravenous Once  . aspirin EC  81 mg Oral Daily  . docusate sodium  100 mg Oral BID  . donepezil  10 mg Oral QHS  . ezetimibe-simvastatin  1 tablet Oral QHS  . ferrous sulfate  325 mg Oral BID WC  . furosemide  40 mg Oral Daily  . insulin aspart  0-9 Units Subcutaneous TID WC  . metoprolol tartrate  25 mg Oral BID  . nystatin   Topical TID  . Warfarin - Pharmacist Dosing Inpatient   Does not apply q1800   Continuous Infusions:  PRN Meds: acetaminophen **OR** acetaminophen, albuterol, ipratropium, ondansetron **OR** ondansetron (ZOFRAN) IV   Vital Signs    Vitals:   08/05/16 0353 08/05/16 0950 08/05/16 0957 08/05/16 1200  BP: 117/63  (!) 150/72 120/64  Pulse: 62   60  Resp: (!) 27 19 (!) 30 20  Temp: 97.5 F (36.4 C)   98.1 F (36.7 C)  TempSrc: Axillary   Oral  SpO2: 92% 90% 100% 96%  Weight:      Height:        Intake/Output Summary (Last 24 hours) at 08/05/16 1232 Last data filed at 08/05/16 1200  Gross per 24 hour  Intake             1670 ml  Output             1500 ml  Net              170 ml   Filed Weights   08/02/16 0603 08/03/16 0444 08/04/16 0529  Weight: 194 lb 12.8 oz (88.4 kg) 200 lb 4.8 oz (90.9 kg) 178 lb 11.2 oz (81.1 kg)    Telemetry    Still with bursts of NSVT otherwise pacing   Personally Reviewed  ECG    07/24/16 v pacing no new EKGs since.  - Personally Reviewed  Physical Exam  *GEN: No acute distress.  Frail Neck: No JVD Cardiac: RRR, occasional premature beats Respiratory: Clear to auscultation bilaterally, ant.. GI: Soft, nontender, non-distended  MS: No edema; Left foot bandage Neuro:  Nonfocal  Psych: Normal affect   Labs    Chemistry  Recent Labs Lab 07/31/16 0400 08/01/16 0400 08/04/16 1032  NA 135 136  137  K 4.4 4.5 4.4  CL 96* 96* 98*  CO2 32 31 32  GLUCOSE 148* 154* 172*  BUN 39* 35* 29*  CREATININE 0.88 0.98 0.89  CALCIUM 9.3 9.5 9.4  GFRNONAA >60 >60 >60  GFRAA >60 >60 >60  ANIONGAP 7 9 7      Hematology  Recent Labs Lab 08/04/16 1032 08/05/16 0454 08/05/16 0731  WBC 11.5* 12.8* 14.9*  RBC 2.89* 3.56* 3.56*  HGB 7.6* REPEATED TO VERIFY 9.7*  HCT 25.7* 31.2* 31.3*  MCV 88.9 87.6 87.9  MCH 26.3 27.0 27.2  MCHC 29.6* 30.8 31.0  RDW 15.7* 15.5 15.5  PLT 292 260 251    Cardiac EnzymesNo results for input(s): TROPONINI in the last 168 hours. No results for input(s): TROPIPOC in the last 168 hours.   BNPNo results for input(s): BNP, PROBNP in the last 168 hours.   DDimer No results for input(s): DDIMER in the last 168 hours.   Radiology    No results found.  Cardiac Studies  Echo 07/31/16 Study Conclusions  - Left ventricle: The cavity size was normal. There was moderate   concentric hypertrophy. Systolic function was normal. The   estimated ejection fraction was in the range of 55% to 60%. Wall   motion was normal; there were no regional wall motion   abnormalities. The study is not technically sufficient to allow   evaluation of LV diastolic function. - Ventricular septum: Septal motion showed paradox. These changes   are consistent with RV pressure overload. - Aortic valve: A bioprosthesis was present and functioning   normally. Valve area (VTI): 1.44 cm^2. Valve area (Vmax): 1.45   cm^2. Valve area (Vmean): 1.48 cm^2. - Mitral valve: Severely calcified annulus. The findings are   consistent with mild stenosis. There was mild to moderate   regurgitation directed centrally. Valve area by continuity   equation (using LVOT flow): 1.35 cm^2. - Left atrium: The atrium was moderately to severely dilated. - Right ventricle: The cavity size was moderately dilated. Systolic   function was mildly to moderately reduced. - Tricuspid valve: There was moderate  regurgitation directed   centrally. - Pulmonary arteries: Systolic pressure was severely increased. PA   peak pressure: 83 mm Hg (S).   Patient Profile     82 y.o. male with a PMH significant for diastolic heart failure, AS s/p AVR, CAD s/p CABG, sick sinus syndrome and complete heart block s/p PPM, atrial fibrillation on coumadin with hx of left atrial thrombus in 2012, PVD, COPD on home oxygen, DM, s/p left foot osteomyelitis s/p amputation (06/2016), and dementia. He presented to the ED after a fall and found to have a right humeral neck fracture. Cardiology was consulted for nonsustained VT on telemetry.   Assessment & Plan    1. NSVT  Continues with short bursts and isolated PVCs. Follow BMP and Magnesium.  Predominantly paced rhythm.  It appears arrhythmia burden is decreasing.  Titrate metoprolol as tolerated ad as needed.  He is not having any palpitations at this time.   2. A fib/SSS/CHB/  PPM Cha2DS2VASc score of 6, on ASA.  Coumadin was on hold with recent procedures but has been resumed.  Hgb is 9.7, INR now 3  3.  Chronic diastolic HF  With pulmonary HTN.  4.  CAD hx CABG no chest pain  5.  Anemia - chronic  6.  Lt foot osteomyelitis with amputation followed by Vascular and IM  7. Rt humeral neck fracture- treated with sling.   Signed, Larae Grooms, MD  08/05/2016, 12:32 PM

## 2016-08-05 NOTE — Consult Note (Signed)
[facility] Consult: 9:02 AM 08/05/2016  LOS: 12 days    Referring Provider: Dr Waldron Labs  Primary Care Physician:  [name], MD Primary Gastroenterologist:  Dr. Jimmy Footman     Reason for Consultation:  Anemia  And FOBT +    HPI: [name]. is a 82 y.o. male.  PMH mild to moderate Dementia.  IDDM.   Osteomyelitis.  S/p toe amputation 2011, left lateral foot 2017, resection left 1st metatarsal head .  CAD, s/p CABG.  S/p AVR 2006, redo bioprosthetic valve 2012. Chronic A fib and hx atrial thrombus, on Coumadin.  S/p pacemaker.  EF 55 to 60%, severe decrease in PA pressures, mild mitral stenosis and regurge on 07/31/16 2D echo.   ASPVD s/p left SFA stenting and 1st toe amputation.   2002 Colonoscopy for hematochezia.  Found polyps (hyperplastic) and int rrhoids.    2005 Colonoscopy for hematochezia.  Dr Fuller Plan.  Found colon polyps (hyperplastic) and internal hemorrhoids.   07/03/16 - 07/21/16 admission from home for osteomyelitis of left foot, treated with Abx.  S/p thoracentesis of left pleural effusion.  S/p atherectomy, balloon angioplasty of left anterior tibial artery and angioplasty of left SFA in-stent restenosis, resection left metatarsal head 1/24.   Discharged to SNF.    Admitted 2/1, nearly 2 weeks ago, after fall and right humeral fx at SNF.  Ortho treating with sling immobilization.  S/p 2.1 liter right thoracentesis.  S/p 2/8 left hallux amputation site I&D, 2nd toe amputation, placement wound vac.  Some op site bleeding noted.  Per vasc surgery is ready for discharge.  Has had NSVT.  Bedside swallow eval 2/9: mild risk for aspiration, rec regular texture ant thin liquids.   Anemia at baseline Hgb ~ 8, as low as 7.3 on 07/24/16.  Since admit has required PRBCs x 4.  Hgb with repeated steady declines have  led to multiple repeat transfusions.  FOBT + yesterday.   No CKD, though BUN elevated to 54 last week.    Denies NSAIDS, ETOH, abdominal pain.  No n/v but poor appetite.  No pyrosis.      Past Medical History:  Diagnosis Date  . Aortal stenosis 2006   biophrostetic  . Arthritis   . Atrial fibrillation ([place])    onset after CABG--- coumadin management at [facility]madin clinic  . Carotid artery disease ([place]) 09/27/2010   Carotid US 12/17: bilat ICA 40-59 >> 1 year FU  . CHF (congestive heart failure) ([place])    abter CABG  . Complete heart block ([place])   . Coronary artery disease   . DM type 2 (diabetes mellitus, type 2) ([place])    used to see Dr Meredith Pel, now f/u by Dr Larose Kells  . Erectile dysfunction   . Gangrene ([place])   . History of nephrolithiasis   . HTN (hypertension)   . Hyperlipidemia   . Memory loss   . Obesity   . OSA (obstructive sleep apnea)    not using CPAP  . Pacemaker 2/07   VVI  . PVD (peripheral vascular disease) (  [place])    CT angio showed- vascular insuff, intestine and RAS bilaterally, andgiogram 02/2009 severe PVD medical management  . S/P aortic valve replacement   . Sick sinus syndrome ([name])    [name]  . Skin cancer    surgery (nose) 09-2010    Past Surgical History:  Procedure Laterality Date  . ABDOMINAL AORTAGRAM N/A 11/10/2012   Procedure: ABDOMINAL Maxcine Ham;  Surgeon: Serafina Mitchell, MD;  Location: [facility] CATH LAB;  Service: Cardiovascular;  Laterality: N/A;  . AMPUTATION  01/2010   great toe  . AMPUTATION Left 07/16/2016   Procedure: RECECTION OF LEFT GREAT TOE METATARSAL HEAD and sesamoid;  Surgeon: Waynetta Sandy, MD;  Location: [place];  Service: Vascular;  Laterality: Left;  . AMPUTATION Left 07/31/2016   Procedure: SECOND TOE AMPUTATION;  Surgeon: Serafina Mitchell, MD;  Location: [place] OR;  Service: Vascular;  Laterality: Left;  . ANGIOPLASTY / STENTING FEMORAL  02/10/10   Left femoral  . ANGIOPLASTY ILLIAC ARTERY Left 07/16/2016   Procedure:  drug coated Balloon ANGIOPLASTY of left superficial femoral artry stent;  Surgeon: Waynetta Sandy, MD;  Location: [place];  Service: Vascular;  Laterality: Left;  . AORTIC VALVE REPLACEMENT     biophrostetic for Ao Stenosis 2006  . AORTIC VALVE REPLACEMENT  01/17/2011   S/P redo median sternotomy, extracorporeal circulation, redo Aortic Valve Replacement using a 23-mm Edwards pericardial Magna-Ease valve, Dr Ulla Gallo  . AORTOGRAM N/A 07/16/2016   Procedure: left lower extremity angiogram;  Surgeon: Waynetta Sandy, MD;  Location: [place];  Service: Vascular;  Laterality: N/A;  . APPLICATION OF WOUND VAC Left 07/31/2016   Procedure: APPLICATION OF WOUND VAC;  Surgeon: Serafina Mitchell, MD;  Location: [place];  Service: Vascular;  Laterality: Left;  . CATARACT EXTRACTION, BILATERAL    . CORONARY ARTERY BYPASS GRAFT  2006  . EP IMPLANTABLE DEVICE N/A 06/06/2016   Procedure: PPM Generator Changeout;  Surgeon: Thompson Grayer, MD;  Location: [place] CV LAB;  Service: Cardiovascular;  Laterality: N/A;  . EYE SURGERY    . ILIAC ATHERECTOMY Left 07/16/2016   Procedure: Angioplasty and ATHERECTOMY of left anterior tibial artery;  Surgeon: Waynetta Sandy, MD;  Location: [place];  Service: Vascular;  Laterality: Left;  . INCISE AND DRAIN ABCESS Right May 2014   right heel   . LOWER EXTREMITY ANGIOGRAM Left 11/23/2012   Procedure: LOWER EXTREMITY ANGIOGRAM;  Surgeon: Serafina Mitchell, MD;  Location[facility] C[facility] LAB;  Service: Cardiovascular;  Laterality: Left;  . PACEMAKER PLACEMENT  07/2005   Medtronic Sigma SR implanted by Dr Olevia Perches  . PERIPHERAL VASCULAR CATHETERIZATION N/A 07/10/2016   Procedure: Abdominal Aortogram;  Surgeon: Conrad Duncan, MD;  Location: [place] CV LAB;  Service: Cardiovascular;  Laterality: N/A;  . PERIPHERAL VASCULAR CATHETERIZATION Bilateral 07/10/2016   Procedure: Lower Extremity Angiography;  Surgeon: Conrad Mulberry, MD;  Location: [place] [facility];  Service:  Cardiovascular;  Laterality: Bilateral;  . TONSILLECTOMY    . ULTRASOUND GUIDANCE FOR VASCULAR ACCESS Bilateral 07/16/2016   Procedure: ULTRASOUND GUIDANCE FOR VASCULAR ACCESS of right common femoral and left dorsalis pedis;  Surgeon: Waynetta Sandy, MD;  Location: [facility] OR;  Service: Vascular;  Laterality: Bilateral;  . WOUND DEBRIDEMENT Left 07/31/2016   Procedure: DEBRIDEMENT GREAT TOE AMPUTATION SITE;  Surgeon: Serafina Mitchell, MD;  Location[facility];  Service: Vascular;  Laterality: Left;    Prior to Admission medications   Medication Sig Start Date  End Date Taking? Authorizing Provider  acetaminophen (TYLENOL) 325 MG tablet Take 2 tablets (650 mg total) by mouth every 4 (four) hours as needed for headache or mild pain. 07/21/16  Yes Oswald Hillock, MD  aspirin EC 81 MG EC tablet Take 1 tablet (81 mg total) by mouth daily. 07/22/16  Yes Oswald Hillock, MD  donepezil (ARICEPT) 10 MG tablet Take 1 tablet (10 mg total) by mouth at bedtime. 05/08/16  Yes Kathrynn Ducking, MD  ezetimibe-simvastatin (VYTORIN) 10-40 MG per tablet Take 1 tablet by mouth at bedtime. 05/06/12  Yes Larey Dresser, MD  furosemide (LASIX) 40 MG tablet Take 1 tablet (40 mg total) by mouth 2 (two) times daily. 07/21/16  Yes Oswald Hillock, MD  insulin aspart (NOVOLOG) 100 UNIT/ML injection Inject 0-9 Units into the skin 3 (three) times daily with meals. Sliding scale insulin Less than 70 initiate hypoglycemia protocol 70-120  0 units 120-150 1 unit 151-200 2 units 201-250 3 units 251-300 5 units 301-350 7 units 351-400 9 units  Greater than 400 call MD 07/21/16  Yes Oswald Hillock, MD  insulin glargine (LANTUS) 100 UNIT/ML injection Inject 0.15 mLs (15 Units total) into the skin at bedtime. 06/07/14  Yes Belkys A Regalado, MD  lisinopril (PRINIVIL,ZESTRIL) 10 MG tablet Take 1 tablet (10 mg total) by mouth daily. 07/25/15  Yes Liliane Shi, PA-C  Multiple Vitamins-Minerals (DECUBI-VITE) CAPS Take 1 capsule by mouth daily.    Yes Historical Provider, MD  OVER THE COUNTER MEDICATION Take 120 mLs by mouth daily. Med pass   Yes Historical Provider, MD  warfarin (COUMADIN) 3 MG tablet Take 4.5 mg by mouth daily at 6 PM.   Yes Historical Provider, MD    Scheduled Meds: . sodium chloride   Intravenous Once  . aspirin EC  81 mg Oral Daily  . docusate sodium  100 mg Oral BID  . donepezil  10 mg Oral QHS  . ezetimibe-simvastatin  1 tablet Oral QHS  . ferrous sulfate  325 mg Oral BID WC  . furosemide  40 mg Oral Daily  . insulin aspart  0-9 Units Subcutaneous TID WC  . metoprolol tartrate  25 mg Oral BID  . nystatin   Topical TID  . Warfarin - Pharmacist Dosing Inpatient   Does not apply q1800   Infusions:  PRN Meds: acetaminophen **OR** acetaminophen, albuterol, ipratropium, ondansetron **OR** ondansetron (ZOFRAN) IV   Allergies as of 07/24/2016 - Review Complete 07/24/2016  Allergen Reaction Noted  . Potassium-containing compounds Anaphylaxis 09/26/2010  . Metoprolol succinate Other (See Comments) 12/23/2006    Family History  Problem Relation Age of Onset  . Heart attack Father 8  . Heart disease Father     Before age 104  . Hypertension Father   . Stroke Mother 41  . Cancer Sister     Cervical  . Heart attack Sister   . Heart disease Sister   . Diabetes Sister     2 sister w/ DM  . Heart attack Sister   . Heart attack Sister   . Heart disease Sister 8    AAA  and  Stomach Aneurysm  . AAA (abdominal aortic aneurysm) Sister   . Heart disease Sister     Before age 46  . Heart attack Sister 36  . Colon cancer Neg Hx   . Prostate cancer Neg Hx     Social History   Social History  . Marital status: Married  Spouse name: N/A  . Number of children: 2  . Years of education: N/A   Occupational History  . retired- long distance Administrator  Retired   Social History Main Topics  . Smoking status: Former Smoker    Types: Cigarettes    Quit date: 06/23/1988  . Smokeless tobacco: Former  Systems developer  . Alcohol use No  . Drug use: No  . Sexual activity: Not Currently   Other Topics Concern  . Not on file   Social History Narrative   Diet: healthy most of the time--   2 step children      Patient drinks about 3-4 cups of caffeine daily.   Patient is right handed.              REVIEW OF SYSTEMS: Constitutional:  He is weak and trapped because he's been in bed for so long. ENT:  No nose bleeds Pulm:  Has a scantly productive cough. CV:  No palpitations, no LE edema. No chest pain GU:  No hematuria, no frequency GI:  Per HPI Heme:  No excessive bleeding or bruising. He is bleeding from the left toe amputation site into the wound VAC.   Transfusions:  He is unaware of previous transfusions before this admission. Neuro:  No headaches, no peripheral tingling or numbness Derm:  No itching, no rash or sores.  Endocrine:  No sweats or chills.  No polyuria or dysuria Immunization: Did not inquire as to recent or previous vaccinations. Travel:  None beyond local counties in last few months.    PHYSICAL EXAM: Vital signs in last 24 hours: Vitals:   08/04/16 2337 08/05/16 0353  BP: (!) 118/47 117/63  Pulse: 62 62  Resp: (!) 30 (!) 27  Temp: 98.4 F (36.9 C) 97.5 F (36.4 C)   Wt Readings from Last 3 Encounters:  08/04/16 81.1 kg (178 lb 11.2 oz)  07/22/16 88 kg (194 lb)  07/20/16 86.2 kg (190 lb)    General: Frail, aged, chronically and acutely ill-looking W M Head:  No signs of head trauma.  Eyes:  No scleral icterus or conjunctival pallor. Ears:  Slightly HOH  Nose:  No discharge or congestion Mouth:  Dentition in poor repair. Caries, missing teeth. Oral mucosa is moist, pink and clear. Tongue lists slightly to patient's right Neck:  No JVD, no masses, no thyromegaly Lungs:  Rhonchi and some fine rales bilaterally. Heart: RRR. No MRG. S1, S2.  Pacemaker sited in upper left chest Abdomen:  Soft. Not tender or distended. Bowel sounds active. No HSM, bruits,  hernias or masses..   Rectal: Deferred the rectal exam.   Musc/Skeltl: No gross joint contracture deformities or swelling. Extremities:  Wound VAC in place covering the left great toe amputation site. Wound VAC container contents are bloody  Neurologic:  Patient is alert. Oriented times 3. Able to move all fours, limb strength was not tested. No tremor. Skin:  No rashes. Tattoos:  Visible on the left arm Nodes:  No cervical adenopathy   Psych:  Quiet, calm, pleasant  Intake/Output from previous day: 02/12 0701 - 02/13 0700 In: [id number] [P.O.:840; I.V.:250; Blood:720] Out: 1300 [Urine:1300] Intake/Output this shift: No intake/output data recorded.  LAB RESULTS:  Recent Labs  08/04/16 1032 08/05/16 0454 08/05/16 0731  WBC 11.5* 12.8* 14.9*  HGB 7.6* REPEATED TO VERIFY 9.7*  HCT 25.7* 31.2* 31.3*  PLT 292 260 251   BMET Lab Results  Component Value Date   NA 137 08/04/2016  NA 136 08/01/2016   NA 135 07/31/2016   K 4.4 08/04/2016   K 4.5 08/01/2016   K 4.4 07/31/2016   CL 98 (L) 08/04/2016   CL 96 (L) 08/01/2016   CL 96 (L) 07/31/2016   CO2 32 08/04/2016   CO2 31 08/01/2016   CO2 32 07/31/2016   GLUCOSE 172 (H) 08/04/2016   GLUCOSE 154 (H) 08/01/2016   GLUCOSE 148 (H) 07/31/2016   BUN 29 (H) 08/04/2016   BUN 35 (H) 08/01/2016   BUN 39 (H) 07/31/2016   CREATININE 0.89 08/04/2016   CREATININE 0.98 08/01/2016   CREATININE 0.88 07/31/2016   CALCIUM 9.4 08/04/2016   CALCIUM 9.5 08/01/2016   CALCIUM 9.3 07/31/2016   LFT No results for input(s): PROT, ALBUMIN, AST, ALT, ALKPHOS, BILITOT, BILIDIR, IBILI in the last 72 hours. PT/INR Lab Results  Component Value Date   INR 3.07 08/05/2016   INR 2.89 08/04/2016   INR 1.98 08/03/2016   PROTIME 16.2 12/05/2008   Hepatitis Panel No results for input(s): HEPBSAG, HCVAB, HEPAIGM, HEPBIGM in the last 72 hours. C-Diff No components found for: CDIFF Lipase  No results found for: LIPASE  Drugs of Abuse  No results  found for: LABOPIA, COCAINSCRNUR, LABBENZ, AMPHETMU, THCU, LABBARB   RADIOLOGY STUDIES: No results found.   IMPRESSION:   *  Anemia, FOBT positive. Previous colonoscopies revealed internal hemorrhoids and hyperplastic colon polyps. Last colonoscopy 2005. No previous upper endoscopy. Patient not on PPI as outpatient or as inpatient.  GI review of systems unremarkable. Elevated BUN last week could be from upper GI bleed.  *  Right humeral fracture, nonoperative management.  *  Left great toe osteomyelitis. Status post amputation of great and second toes. Wound VAC is draining bloody material.  *  ASPVD.    PLAN:     *  Probably needs EGD, will discuss with Dr. Silverio Decamp.  Start empiric oral Protonix.   Azucena Freed  08/05/2016, 9:02 AM Pager: [phone]

## 2016-08-05 NOTE — Progress Notes (Signed)
PROGRESS NOTE  [name].  [id number] DOB: Dec 08, 1934  DOA: 07/24/2016 PCP: [name], MD   Brief Narrative:  82 year old male with PMH of severe AS s/p redo AVR (bioprosthetic valve), chronic diastolic CHF, CAD status post CABG, complete heart block s/p PPM, A. fib on chronic Coumadin, history of left atrial thrombus by TEE 7/12, PAD, COPD on home oxygen 2 L/m, type II DM, HTN, HLD, OSA not on CPAP, left foot osteomyelitis status post amputation of great toe, Alzheimer's dementia, recent hospitalization 1/11-1/29 for acute respiratory failure with hypoxia, noted to have a large right-sided pleural effusion for which she underwent thoracentesis, diabetic foot ulcer with osteomyelitis treated with antibiotics and then discharged to [facility] for 3 days, presented back to the ED following a fall on day of admission, found on floor. Also noted more lethargic than usual and worsening short-term memory. X-rays confirmed right humeral neck fracture. Hemoglobin 7.3. Vascular surgery consulted for left foot wound management-underwent I&D of left great toe on 2/6 and 2/8. Cardiology consulted for NSVT. Orthopedics consulted for right humerus fracture and recommended nonoperative management.   Assessment & Plan:   Principal Problem:   Right humeral fracture Active Problems:   DM (diabetes mellitus) type II uncontrolled, periph vascular disorder ([name])   S/P AVR (aortic valve replacement)   Chronic atrial fibrillation ([name])   Chronic diastolic heart failure ([name])   Pleural effusion   Anemia   Supratherapeutic INR   Chronic respiratory failure ([place])   Fall   Alzheimer's dementia   Fracture of humerus neck, right, closed, initial encounter   NSVT (nonsustained ventricular tachycardia) ([place])   1. Right humeral neck fracture: Sustained status post fall at SNF. X-ray of right shoulder 07/24/16 confirmed displaced mildly comminuted humeral neck fracture and no evidence of intra-articular  extension. Continue right shoulder sling. Orthopedics consultation appreciated: Confirmed right proximal humerus fracture of mainly the surgical neck which can be treated with just a sling. Outpatient follow-up with Dr. Jean Rosenthal in 2 weeks.  2. Acute on chronic anemia: Hemoglobin 12 g in December 2017 but has progressively dropped and was 7.3 on admission. So far required 4 units PRBC transfusion this hospital stay, improved from 7.6 to  9.7 today, she was Hemoccult positive, as well as dark colored stool on my physical exam, so GI was consulted for further evaluation and recommendation especially patient on anticoagulation for his A. Fib   3. Chronic A. fib: Presented with supratherapeutic INR of 3.6. Received vitamin K 1 mg on admission. Coumadin held. CHA2DS2VASc score is 6. Metoprolol started by cardiology for NSVT which should help A. fib too. Initially bridging with IV heparin, stopped as INR is therapeutic .  4. Right-sided pleural effusion, recurrent: Status post 2.1 L thoracentesis on 2/5 with improved follow-up chest x-ray. Transudative. X-ray 07/30/16: Poor film. Reports cardiomegaly and mild pulmonary edema. Lung bases poorly defined due to body habitus and overlying soft tissue. Saturating in the high 90s on 3 L/m nasal cannula oxygen.  5. Leukocytosis: Mild and stable.  6. Hypotension: Initial low blood pressures of 93/48. Antihypertensives held. Improved.  7. Chronic hypoxic respiratory failure: On home oxygen 2 L/m. Some concern for dysphagia on 2/8. Speech therapy evaluated 2/9 and recommend regular consistency diet and thin liquids.  8. Stage III chronic kidney disease: Stable.  9. Mild hyperkalemia: Resolved.  10. Chronic diastolic CHF: Clinically euvolemic. Continue oral Lasix.  11. CAD status post CABG: Asymptomatic of chest pain.  12. Complete heart block  status post PPM: Monitor on telemetry which also showed episodes of NSVT.   13. Status post bioprosthetic  AVR:  14. Type II DM: Reasonable inpatient control on SSI.  15. Essential hypertension: Management as above.  72. S/P angioplasty and stenting LLE and left great toe amputation: As per vascular surgery follow-up, underwent excisional debridement of left great toe amputation site on 2/6 night and repeat I&D in all OR on 2/8 followed by negative pressure dressing. Vascular surgery has seen and cleared for discharge. Outpatient follow-up with vascular surgery.  17. Nonsustained VT: Monitored on telemetry. Cardiology consultation and follow-up appreciated. Started on metoprolol. 2-D echo 07/31/16: LVEF 55-60 percent, normal wall motion without regional wall motion abnormalities and severe pulmonary hypertension. Metoprolol started and Increased metoprolol to 25 MG twice a day. NSVT burden has decreased. Potassium 4.4. Magnesium 1.8. Replace to keep >2.  18. Dementia: Moderate at least. Continue Aricept.  56. Adult failure to thrive: Apparently has been declining for some time now.? Consider palliative care consultation for goals of care >can be done at SNF..   DVT prophylaxis: Discontinue IV heparin bridge 2/13. Continue Coumadin per pharmacy. Code Status: Full Family Communication: No family at bedside. Disposition Plan: DC back to SNF when medically stable, possibly 08/05/16.   Consultants:   Vascular surgery  Interventional radiology  Cardiology   Orthopedics  Gastroenterology  Procedures:   Therapeutic right thoracentesis on 07/28/16  Left great toe amputation site debridement by vascular surgery on 07/29/16  I&D left great toe 07/31/16  2-D echo 07/31/16: Study Conclusions  - Left ventricle: The cavity size was normal. There was moderate   concentric hypertrophy. Systolic function was normal. The   estimated ejection fraction was in the range of 55% to 60%. Wall   motion was normal; there were no regional wall motion   abnormalities. The study is not technically sufficient to  allow   evaluation of LV diastolic function. - Ventricular septum: Septal motion showed paradox. These changes   are consistent with RV pressure overload. - Aortic valve: A bioprosthesis was present and functioning   normally. Valve area (VTI): 1.44 cm^2. Valve area (Vmax): 1.45   cm^2. Valve area (Vmean): 1.48 cm^2. - Mitral valve: Severely calcified annulus. The findings are   consistent with mild stenosis. There was mild to moderate   regurgitation directed centrally. Valve area by continuity   equation (using LVOT flow): 1.35 cm^2. - Left atrium: The atrium was moderately to severely dilated. - Right ventricle: The cavity size was moderately dilated. Systolic   function was mildly to moderately reduced. - Tricuspid valve: There was moderate regurgitation directed   centrally. - Pulmonary arteries: Systolic pressure was severely increased. PA   peak pressure: 83 mm Hg (S).   Antimicrobials:   None    Subjective: Patient doesn't say much and hasn't had any complaints for the last several days taking care of him. Denies pain, dyspnea. As per RN, had dark BM due to iron supplements.  Objective:  Vitals:   08/04/16 2337 08/05/16 0353 08/05/16 0950 08/05/16 0957  BP: (!) 118/47 117/63  (!) 150/72  Pulse: 62 62    Resp: (!) 30 (!) 27 19 (!) 30  Temp: 98.4 F (36.9 C) 97.5 F (36.4 C)    TempSrc: Oral Axillary    SpO2: 94% 92% 90% 100%  Weight:      Height:        Intake/Output Summary (Last 24 hours) at 08/05/16 1044 Last data filed at 08/05/16  1000  Gross per 24 hour  Intake             1570 ml  Output             1400 ml  Net              170 ml   Filed Weights   08/02/16 0603 08/03/16 0444 08/04/16 0529  Weight: 88.4 kg (194 lb 12.8 oz) 90.9 kg (200 lb 4.8 oz) 81.1 kg (178 lb 11.2 oz)    Examination:  General exam: Pleasant elderly male, moderately built and frail, chronically ill looking, lying comfortably propped up in bed. Respiratory system: Diminished  breath sounds in the bases but otherwise clear to auscultation. Respiratory effort normal. Cardiovascular system: S1 & S2 heard, RRR. No JVD, murmurs, rubs, gallops or clicks. No pedal edema. Telemetry: V paced rhythm.  3-5 beat episodes of NSVT 2 in the last 24 hours. Gastrointestinal system: Abdomen is nondistended, soft and nontender. No organomegaly or masses felt. Normal bowel sounds heard. Central nervous system: Alert and oriented 2. No focal neurological deficits. Extremities: Symmetric 5 x 5 power except right upper extremity which is immobilized in a sling.. Skin: Left great toe site wound VAC intact and draining slightly bloody fluid . Psychiatry: Judgement and insight impaired. Mood & affect appropriate.     Data Reviewed: I have personally reviewed following labs and imaging studies  CBC:  Recent Labs Lab 08/02/16 0643 08/03/16 0430 08/04/16 1032 08/05/16 0454 08/05/16 0731  WBC 10.9* 11.9* 11.5* 12.8* 14.9*  HGB 8.3* 7.9* 7.6* REPEATED TO VERIFY 9.7*  HCT 27.6* 25.9* 25.7* 31.2* 31.3*  MCV 89.9 88.7 88.9 87.6 87.9  PLT 308 309 292 260 [id number]   Basic Metabolic Panel:  Recent Labs Lab 07/30/16 0825 07/30/16 1352 07/31/16 0400 08/01/16 0400 08/04/16 1032  NA 138  --  135 136 137  K 4.3  --  4.4 4.5 4.4  CL 97*  --  96* 96* 98*  CO2 35*  --  32 31 32  GLUCOSE 158*  --  148* 154* 172*  BUN 37*  --  39* 35* 29*  CREATININE 0.91  --  0.88 0.98 0.89  CALCIUM 9.7  --  9.3 9.5 9.4  MG  --  1.9  --   --  1.8   GFR: Estimated Creatinine Clearance: 69.5 mL/min (by C-G formula based on SCr of 0.89 mg/dL). Liver Function Tests: No results for input(s): AST, ALT, ALKPHOS, BILITOT, PROT, ALBUMIN in the last 168 hours. No results for input(s): LIPASE, AMYLASE in the last 168 hours. No results for input(s): AMMONIA in the last 168 hours. Coagulation Profile:  Recent Labs Lab 07/30/16 0825 08/02/16 0643 08/03/16 0430 08/04/16 1032 08/05/16 0454  INR 1.30 1.66  1.98 2.89 3.07   Cardiac Enzymes: No results for input(s): CKTOTAL, CKMB, CKMBINDEX, TROPONINI in the last 168 hours. BNP (last 3 results) No results for input(s): PROBNP in the last 8760 hours. HbA1C: No results for input(s): HGBA1C in the last 72 hours. CBG:  Recent Labs Lab 08/04/16 0724 08/04/16 1135 08/04/16 1639 08/04/16 2136 08/05/16 0820  GLUCAP 151* 147* 168* 162* 167*   Lipid Profile: No results for input(s): CHOL, HDL, LDLCALC, TRIG, CHOLHDL, LDLDIRECT in the last 72 hours. Thyroid Function Tests: No results for input(s): TSH, T4TOTAL, FREET4, T3FREE, THYROIDAB in the last 72 hours. Anemia Panel: No results for input(s): VITAMINB12, FOLATE, FERRITIN, TIBC, IRON, RETICCTPCT in the last 72 hours.  Sepsis Labs:  Recent Labs Lab 08/03/16 0430 08/04/16 1032 08/05/16 0454 08/05/16 0731  WBC 11.9* 11.5* 12.8* 14.9*    Recent Results (from the past 240 hour(s))  Culture, body fluid-bottle     Status: None   Collection Time: 07/28/16  3:09 PM  Result Value Ref Range Status   Specimen Description FLUID RIGHT PLEURAL  Final   Special Requests BOTTLES DRAWN AEROBIC AND ANAEROBIC 10CC  Final   Culture NO GROWTH 5 DAYS  Final   Report Status 08/02/2016 FINAL  Final  Gram stain     Status: None   Collection Time: 07/28/16  3:09 PM  Result Value Ref Range Status   Specimen Description FLUID RIGHT PLEURAL  Final   Special Requests NONE  Final   Gram Stain   Final    RARE WBC PRESENT, PREDOMINANTLY PMN NO ORGANISMS SEEN    Report Status 07/28/2016 FINAL  Final         Radiology Studies: No results found.      Scheduled Meds: . sodium chloride   Intravenous Once  . aspirin EC  81 mg Oral Daily  . docusate sodium  100 mg Oral BID  . donepezil  10 mg Oral QHS  . ezetimibe-simvastatin  1 tablet Oral QHS  . ferrous sulfate  325 mg Oral BID WC  . furosemide  40 mg Oral Daily  . insulin aspart  0-9 Units Subcutaneous TID WC  . metoprolol tartrate  25 mg  Oral BID  . nystatin   Topical TID  . Warfarin - Pharmacist Dosing Inpatient   Does not apply q1800   Continuous Infusions:    LOS: 12 days        Hadli Vandemark, MD [facility] Pager [phone]  If 7PM-7AM, please contact night-coverage [email or web address] Password [facility] 08/05/2016, 10:44 AM

## 2016-08-05 NOTE — Consult Note (Signed)
   [facility] CM Inpatient Consult   08/05/2016  [name]. [birth year] [id number]  Chart reviewed for re-admission.  Patient is 82 year old male with PMH of severe AS s/p redo AVR (bioprosthetic valve), chronic diastolic CHF, CAD status post CABG, complete heart block s/p PPM, A. fib on chronic Coumadin, history of left atrial thrombus by TEE 7/12, PAD, COPD on home oxygen 2 L/m, type II DM, HTN, HLD, OSA not on CPAP, left foot osteomyelitis status post amputation of great toe, Alzheimer's dementia, recent hospitalization 1/11-1/29 for acute respiratory failure with hypoxia, noted to have a large right-sided pleural effusion for which he underwent thoracentesis, diabetic foot ulcer with osteomyelitis treated with antibiotics and then discharged to [facility] for 3 days, presented back to the ED.  Patient was re-admitted 07/24/16.  Patient underwent a debridement and great toe amputation.  Patient is to discharge back to a skilled facility.  Patient will have rehab and wound care there and no acute [facility] Care Management needs are identified.  For questions, please contact:  Natividad Brood, RN BSN [facility] Liaison  [phone] business mobile phone Toll free office [phone]

## 2016-08-05 NOTE — Care Management Important Message (Signed)
Important Message  Patient Details  Name: [name]. MRN: [id number] Date of Birth: Sep 15, 1934   Medicare Important Message Given:  Yes    [name] 08/05/2016, 1:24 PM

## 2016-08-05 NOTE — Progress Notes (Addendum)
[place] for Coumadin Indication: atrial fibrillation  Allergies  Allergen Reactions  . Potassium-Containing Compounds Anaphylaxis    IV--loss of conciousness  . Metoprolol Succinate Other (See Comments)    Extremely tired    Patient Measurements: Height: 5\' 10"  (177.8 cm) Weight: 178 lb 11.2 oz (81.1 kg) (SCDs and Woundvac held off the bed when patient weighed) IBW/kg (Calculated) : 73 Heparin Dosing Weight:  73 kg  Vital Signs: Temp: 98.1 F (36.7 C) (02/13 1200) Temp Source: Oral (02/13 1200) BP: 120/64 (02/13 1200) Pulse Rate: 60 (02/13 1200)  Labs:  Recent Labs  08/03/16 0430 08/04/16 1032 08/05/16 0454 08/05/16 0731  HGB 7.9* 7.6* REPEATED TO VERIFY 9.7*  HCT 25.9* 25.7* 31.2* 31.3*  PLT 309 292 260 251  LABPROT 22.8* 30.8* 32.3*  --   INR 1.98 2.89 3.07  --   HEPARINUNFRC 0.60 0.59  --   --   CREATININE  --  0.89  --   --     Estimated Creatinine Clearance: 69.5 mL/min (by C-G formula based on SCr of 0.89 mg/dL).  Assessment: 82 year old male who presented to the ED 07/24/16 after a fall and found to have a right humeral neck fracture.  He has  PMH of severe AS s/p redo AVR (bioprosthetic valve) on chronic coumadin PTA for Afib with hx of left atrial thrombus in 2012.  Presented with supratherapeutic INR of 3.6. Received vitamin K 1 mg on admission & coumadin held for recent procedures. S/P left great toe amputation , angioplasty and stenting LLE  on 2/8 and blood transfusion.  Coumadin resumed 2/9 after procedure.    .  Today INR 3.07, therapeutic INR , increased from 2.89 <1.98,   Hgb improved to 9.7 s/p PRBCs yesterday. pltc remains wnl. No bleeding noted.  On iron supplements for iron deficiency anemia per anemia/iron panel.   PTA warfarin dose: 4.5 mg daily (INR 3.6 on admit)  Goal of Therapy:  Heparin level 0.3-0.7 units/ml  INR 2-3 Monitor platelets by anticoagulation protocol: Yes    Plan:  -Warfarin 2mg   PO x1 again tonight  -Monitor for signs/symptoms of bleeding -Daily  CBC, INR  Nicole Cella, RPh Clinical Pharmacist 8A-4P [phone] 4P-10P [phone] [place] [phone] 08/05/2016 4:41 PM

## 2016-08-06 ENCOUNTER — Inpatient Hospital Stay (HOSPITAL_COMMUNITY): Payer: Medicare Other

## 2016-08-06 DIAGNOSIS — J90 Pleural effusion, not elsewhere classified: Secondary | ICD-10-CM

## 2016-08-06 DIAGNOSIS — R791 Abnormal coagulation profile: Secondary | ICD-10-CM

## 2016-08-06 DIAGNOSIS — Z952 Presence of prosthetic heart valve: Secondary | ICD-10-CM

## 2016-08-06 LAB — BASIC METABOLIC PANEL
Anion gap: 7 (ref 5–15)
BUN: 35 mg/dL — AB (ref 6–20)
CO2: 31 mmol/L (ref 22–32)
CREATININE: 0.88 mg/dL (ref 0.61–1.24)
Calcium: 9.5 mg/dL (ref 8.9–10.3)
Chloride: 99 mmol/L — ABNORMAL LOW (ref 101–111)
GFR calc Af Amer: >60 mL/min (ref >60)
GLUCOSE: 147 mg/dL — AB (ref 65–99)
POTASSIUM: 4.9 mmol/L (ref 3.5–5.1)
SODIUM: 137 mmol/L (ref 135–145)

## 2016-08-06 LAB — CBC
HCT: 31.5 % — ABNORMAL LOW (ref 39.0–52.0)
Hemoglobin: 9.6 g/dL — ABNORMAL LOW (ref 13.0–17.0)
MCH: 27.3 pg (ref 26.0–34.0)
MCHC: 30.5 g/dL (ref 30.0–36.0)
MCV: 89.5 fL (ref 78.0–100.0)
PLATELETS: 264 10*3/uL (ref 150–400)
RBC: 3.52 MIL/uL — AB (ref 4.22–5.81)
RDW: 16 % — ABNORMAL HIGH (ref 11.5–15.5)
WBC: 12.8 10*3/uL — ABNORMAL HIGH (ref 4.0–10.5)

## 2016-08-06 LAB — PROTIME-INR
INR: 3.53
INR: 1.8
PROTHROMBIN TIME: 36.2 s — AB (ref 11.4–15.2)
Prothrombin Time: 21.1 seconds — ABNORMAL HIGH (ref 11.4–15.2)

## 2016-08-06 LAB — GLUCOSE, CAPILLARY
GLUCOSE-CAPILLARY: 154 mg/dL — AB (ref 65–99)
GLUCOSE-CAPILLARY: 140 mg/dL — AB (ref 65–99)
Glucose-Capillary: 142 mg/dL — ABNORMAL HIGH (ref 65–99)
Glucose-Capillary: 154 mg/dL — ABNORMAL HIGH (ref 65–99)

## 2016-08-06 MED ORDER — SACCHAROMYCES BOULARDII 250 MG PO CAPS
250.0000 mg | ORAL_CAPSULE | Freq: Two times a day (BID) | ORAL | Status: DC
  Administered 2016-08-06 – 2016-08-09 (×6): 250 mg via ORAL
  Filled 2016-08-06 (×6): qty 1

## 2016-08-06 MED ORDER — DEXTROSE 5 % IV SOLN
10.0000 mg | Freq: Once | INTRAVENOUS | Status: AC
  Administered 2016-08-06: 10 mg via INTRAVENOUS
  Filled 2016-08-06: qty 1

## 2016-08-06 NOTE — Progress Notes (Addendum)
[place] for Coumadin Indication: atrial fibrillation  Allergies  Allergen Reactions  . Potassium-Containing Compounds Anaphylaxis    IV--loss of conciousness  . Metoprolol Succinate Other (See Comments)    Extremely tired    Patient Measurements: Height: 5\' 10"  (177.8 cm) Weight: 195 lb 8 oz (88.7 kg) IBW/kg (Calculated) : 73 Heparin Dosing Weight:  73 kg  Vital Signs: Temp: 98.3 F (36.8 C) (02/14 0522) Temp Source: Axillary (02/14 0522) BP: 94/57 (02/14 0830) Pulse Rate: 63 (02/14 0522)  Labs:  Recent Labs  08/04/16 1032 08/05/16 0454 08/05/16 0731 08/06/16 0547  HGB 7.6* REPEATED TO VERIFY 9.7* 9.6*  HCT 25.7* 31.2* 31.3* 31.5*  PLT 292 260 251 264  LABPROT 30.8* 32.3*  --  36.2*  INR 2.89 3.07  --  3.53  HEPARINUNFRC 0.59  --   --   --   CREATININE 0.89  --   --  0.88    Estimated Creatinine Clearance: 76.3 mL/min (by C-G formula based on SCr of 0.88 mg/dL).  Assessment: 82 year old male who presented to the ED 07/24/16 after a fall and found to have a right humeral neck fracture.  He has  PMH of severe AS s/p redo AVR (bioprosthetic valve) on chronic coumadin PTA for Afib with hx of left atrial thrombus in 2012.  Presented with supratherapeutic INR of 3.6. Received vitamin K 1 mg on admission & coumadin held for recent procedures. S/P left great toe amputation , angioplasty and stenting LLE  on 2/8 and blood transfusion.  Coumadin resumed 2/9 after procedure.    Today INR 3.53, SUPRtherapeutic INR ,  Hgb  low stable ~ 9, s/p PRBCs 2/12 pltc remains wnl. No bleeding noted Hold coumadin tonight.  MD to give Vit K 10 mg IV x1 2/14.  Right-sided pleural effusion, recurrent s/p thoracentesis on 2/5 but today w/  evidence of reaccumulation of pleural fluid on x-ray today, plan for US guided thoracentesis when INR decreased.  .  On iron supplements for iron deficiency anemia per anemia/iron panel.   PTA warfarin dose: 4.5 mg  daily (INR 3.6 on admit)  Goal of Therapy:  INR 2-3 Monitor platelets by anticoagulation protocol: Yes    Plan:  Hold coumadin tonight.   MD to give Vit K 10 mg IV x1 today due to plan for thoracentesis -Monitor for signs/symptoms of bleeding -Daily  CBC, INR Consult pharmacy if you want IV heparin bridge if INR <2   Nicole Cella, RPh Clinical Pharmacist 8A-4P [phone] 4P-10P [phone] [place] [phone] 08/06/2016 11:59 AM

## 2016-08-06 NOTE — Progress Notes (Signed)
Pt requested that his wife sign the consent for the upper endoscopy.

## 2016-08-06 NOTE — Progress Notes (Signed)
PROGRESS NOTE  [name].  [id number] DOB: Aug 30, 1934  DOA: 07/24/2016 PCP: [name], MD   Brief Narrative:  82 year old male with PMH of severe AS s/p redo AVR (bioprosthetic valve), chronic diastolic CHF, CAD status post CABG, complete heart block s/p PPM, A. fib on chronic Coumadin, history of left atrial thrombus by TEE 7/12, PAD, COPD on home oxygen 2 L/m, type II DM, HTN, HLD, OSA not on CPAP, left foot osteomyelitis status post amputation of great toe, Alzheimer's dementia, recent hospitalization 1/11-1/29 for acute respiratory failure with hypoxia, noted to have a large right-sided pleural effusion for which she underwent thoracentesis, diabetic foot ulcer with osteomyelitis treated with antibiotics and then discharged to [facility] for 3 days, presented back to the ED following a fall on day of admission, found on floor. Also noted more lethargic than usual and worsening short-term memory. X-rays confirmed right humeral neck fracture. Hemoglobin 7.3. Vascular surgery consulted for left foot wound management-underwent I&D of left great toe on 2/6 and 2/8. Cardiology consulted for NSVT. Orthopedics consulted for right humerus fracture and recommended nonoperative management.   Assessment & Plan:   Principal Problem:   Right humeral fracture Active Problems:   DM (diabetes mellitus) type II uncontrolled, periph vascular disorder ([name])   S/P AVR (aortic valve replacement)   Chronic atrial fibrillation ([name])   Chronic diastolic heart failure ([name])   Pleural effusion   Anemia   Supratherapeutic INR   Chronic respiratory failure ([place])   Fall   Alzheimer's dementia   Fracture of humerus neck, right, closed, initial encounter   NSVT (nonsustained ventricular tachycardia) ([name])   PVC (premature ventricular contraction)   Right humeral neck fracture:  - Sustained status post fall at SNF. X-ray of right shoulder 07/24/16 confirmed displaced mildly comminuted humeral neck  fracture and no evidence of intra-articular extension. Continue right shoulder sling. Orthopedics consultation appreciated: Confirmed right proximal humerus fracture of mainly the surgical neck which can be treated with just a sling. Outpatient follow-up with Dr. Jean Rosenthal in 2 weeks.  Acute on chronic anemia:  - Hemoglobin 12 g in December 2017 but has progressively dropped and was 7.3 on admission. So far required 4 units PRBC transfusion this hospital stay, he was Hemoccult positive, as well as dark colored stool on my physical exam, GI consult appreciated, plan for EGD when INR is less than 1.8, so we'll give total of IV vitamin K today, hopefully can be done tomorrow  Chronic A. fib: Presented with supratherapeutic INR of 3.6. Received vitamin K 1 mg on admission. Coumadin held. CHA2DS2VASc score is 6. Metoprolol started by cardiology for NSVT which should help A. fib too. Initially bridging with IV heparin, stopped as INR is therapeutic , warfarin normal today has will need EGD tomorrow,   Right-sided pleural effusion, recurrent:  - Status post 2.1 L thoracentesis on 2/5 with improved follow-up chest x-ray. Transudative, evidence of reaccumulation of pleural fluid on x-ray today, and increased oxygen demand, so will request ultrasound-guided thoracentesis.  Chronic hypoxic respiratory failure:  - On home oxygen 2 L/m. Some concern for dysphagia on 2/8. Speech therapy evaluated 2/9 and recommend regular consistency diet and thin liquids.  Stage III chronic kidney disease:  - Stable.  Mild hyperkalemia:  - Resolved.  Chronic diastolic CHF:  - Clinically euvolemic. Continue oral Lasix.   CAD status post CABG: -  Asymptomatic of chest pain.  Complete heart block status post PPM:  - Monitor on telemetry which also  showed episodes of NSVT.   Status post bioprosthetic AVR  Type II DM: - Reasonable inpatient control on SSI.  Essential hypertension: Management as  above.  S/P angioplasty and stenting LLE and left great toe amputation:  - As per vascular surgery follow-up, underwent excisional debridement of left great toe amputation site on 2/6 night and repeat I&D in all OR on 2/8 followed by negative pressure dressing. Vascular surgery has seen and cleared for discharge. Outpatient follow-up with vascular surgery.  Nonsustained VT:  - Monitored on telemetry. Cardiology consultation and follow-up appreciated. Started on metoprolol. 2-D echo 07/31/16: LVEF 55-60 percent, normal wall motion without regional wall motion abnormalities and severe pulmonary hypertension. Metoprolol started and Increased metoprolol to 25 MG twice a day. NSVT burden has decreased. Potassium 4.4. Magnesium 1.8. Replace to keep >2.  Dementia:  - Moderate at least. Continue Aricept.  Adult failure to thrive: - Apparently has been declining for some time now.? Consider palliative care consultation for goals of care >can be done at SNF..   DVT prophylaxis: on warfarin  Code Status: Full Family Communication: No family at bedside. Disposition Plan: DC back to SNF when medically stable, possibly 08/05/16.   Consultants:   Vascular surgery  Interventional radiology  Cardiology   Orthopedics  Gastroenterology  Procedures:   Therapeutic right thoracentesis on 07/28/16  Left great toe amputation site debridement by vascular surgery on 07/29/16  I&D left great toe 07/31/16  2-D echo 07/31/16: Study Conclusions  - Left ventricle: The cavity size was normal. There was moderate   concentric hypertrophy. Systolic function was normal. The   estimated ejection fraction was in the range of 55% to 60%. Wall   motion was normal; there were no regional wall motion   abnormalities. The study is not technically sufficient to allow   evaluation of LV diastolic function. - Ventricular septum: Septal motion showed paradox. These changes   are consistent with RV pressure overload. -  Aortic valve: A bioprosthesis was present and functioning   normally. Valve area (VTI): 1.44 cm^2. Valve area (Vmax): 1.45   cm^2. Valve area (Vmean): 1.48 cm^2. - Mitral valve: Severely calcified annulus. The findings are   consistent with mild stenosis. There was mild to moderate   regurgitation directed centrally. Valve area by continuity   equation (using LVOT flow): 1.35 cm^2. - Left atrium: The atrium was moderately to severely dilated. - Right ventricle: The cavity size was moderately dilated. Systolic   function was mildly to moderately reduced. - Tricuspid valve: There was moderate regurgitation directed   centrally. - Pulmonary arteries: Systolic pressure was severely increased. PA   peak pressure: 83 mm Hg (S).   Antimicrobials:   None    Subjective: Patient denies any complaints, no chest pain, no shortness of breath, no headache, no nausea or vomiting  Objective:  Vitals:   08/05/16 1200 08/05/16 2029 08/06/16 0522 08/06/16 0830  BP: 120/64 113/64 (!) 122/59 (!) 94/57  Pulse: 60 (!) 56 63   Resp: 20 20 (!) 22   Temp: 98.1 F (36.7 C) 97.5 F (36.4 C) 98.3 F (36.8 C)   TempSrc: Oral Oral Axillary   SpO2: 96% 93% 98% 100%  Weight:   88.7 kg (195 lb 8 oz)   Height:        Intake/Output Summary (Last 24 hours) at 08/06/16 1134 Last data filed at 08/06/16 0522  Gross per 24 hour  Intake  340 ml  Output              850 ml  Net             -510 ml   Filed Weights   08/03/16 0444 08/04/16 0529 08/06/16 0522  Weight: 90.9 kg (200 lb 4.8 oz) 81.1 kg (178 lb 11.2 oz) 88.7 kg (195 lb 8 oz)    Examination:  General exam: Pleasant elderly male, moderately built and frail, chronically ill looking, lying comfortably propped up in bed. Respiratory system: Diminished breath sounds in the Right base, otherwise clear to auscultation. Respiratory effort normal. Cardiovascular system: S1 & S2 heard, RRR. No JVD, murmurs, rubs, gallops or clicks. No pedal  edema. Telemetry: V paced rhythm.  3-5 beat episodes of NSVT 2 in the last 24 hours. Gastrointestinal system: Abdomen is nondistended, soft and nontender. No organomegaly or masses felt. Normal bowel sounds heard. Central nervous system: Alert and oriented 2. No focal neurological deficits. Extremities: Symmetric 5 x 5 power except right upper extremity which is immobilized in a sling.. Skin: Left great toe site wound VAC intact and draining slightly bloody fluid . Psychiatry: Judgement and insight impaired. Mood & affect appropriate.     Data Reviewed: I have personally reviewed following labs and imaging studies  CBC:  Recent Labs Lab 08/03/16 0430 08/04/16 1032 08/05/16 0454 08/05/16 0731 08/06/16 0547  WBC 11.9* 11.5* 12.8* 14.9* 12.8*  HGB 7.9* 7.6* REPEATED TO VERIFY 9.7* 9.6*  HCT 25.9* 25.7* 31.2* 31.3* 31.5*  MCV 88.7 88.9 87.6 87.9 89.5  PLT 309 292 260 251 [id number]   Basic Metabolic Panel:  Recent Labs Lab 07/30/16 1352 07/31/16 0400 08/01/16 0400 08/04/16 1032 08/06/16 0547  NA  --  135 136 137 137  K  --  4.4 4.5 4.4 4.9  CL  --  96* 96* 98* 99*  CO2  --  32 31 32 31  GLUCOSE  --  148* 154* 172* 147*  BUN  --  39* 35* 29* 35*  CREATININE  --  0.88 0.98 0.89 0.88  CALCIUM  --  9.3 9.5 9.4 9.5  MG 1.9  --   --  1.8  --    GFR: Estimated Creatinine Clearance: 76.3 mL/min (by C-G formula based on SCr of 0.88 mg/dL). Liver Function Tests: No results for input(s): AST, ALT, ALKPHOS, BILITOT, PROT, ALBUMIN in the last 168 hours. No results for input(s): LIPASE, AMYLASE in the last 168 hours. No results for input(s): AMMONIA in the last 168 hours. Coagulation Profile:  Recent Labs Lab 08/02/16 0643 08/03/16 0430 08/04/16 1032 08/05/16 0454 08/06/16 0547  INR 1.66 1.98 2.89 3.07 3.53   Cardiac Enzymes: No results for input(s): CKTOTAL, CKMB, CKMBINDEX, TROPONINI in the last 168 hours. BNP (last 3 results) No results for input(s): PROBNP in the last  8760 hours. HbA1C: No results for input(s): HGBA1C in the last 72 hours. CBG:  Recent Labs Lab 08/05/16 1200 08/05/16 1639 08/05/16 2304 08/06/16 0837 08/06/16 1119  GLUCAP 142* 157* 141* 154* 142*   Lipid Profile: No results for input(s): CHOL, HDL, LDLCALC, TRIG, CHOLHDL, LDLDIRECT in the last 72 hours. Thyroid Function Tests: No results for input(s): TSH, T4TOTAL, FREET4, T3FREE, THYROIDAB in the last 72 hours. Anemia Panel: No results for input(s): VITAMINB12, FOLATE, FERRITIN, TIBC, IRON, RETICCTPCT in the last 72 hours.  Sepsis Labs:  Recent Labs Lab 08/04/16 1032 08/05/16 0454 08/05/16 0731 08/06/16 0547  WBC 11.5* 12.8* 14.9* 12.8*  Recent Results (from the past 240 hour(s))  Culture, body fluid-bottle     Status: None   Collection Time: 07/28/16  3:09 PM  Result Value Ref Range Status   Specimen Description FLUID RIGHT PLEURAL  Final   Special Requests BOTTLES DRAWN AEROBIC AND ANAEROBIC 10CC  Final   Culture NO GROWTH 5 DAYS  Final   Report Status 08/02/2016 FINAL  Final  Gram stain     Status: None   Collection Time: 07/28/16  3:09 PM  Result Value Ref Range Status   Specimen Description FLUID RIGHT PLEURAL  Final   Special Requests NONE  Final   Gram Stain   Final    RARE WBC PRESENT, PREDOMINANTLY PMN NO ORGANISMS SEEN    Report Status 07/28/2016 FINAL  Final         Radiology Studies: Dg Chest Port 1 View  Result Date: 08/06/2016 CLINICAL DATA:  Hypoxia. EXAM: PORTABLE CHEST 1 VIEW COMPARISON:  Seven days prior FINDINGS: Progressive haziness of the right chest with the appearance of layering pleural effusion, moderate or large. Better aerated left chest. Status post CABG and aortic valve replacement. Single chamber pacer from the left in stable position. No pulmonary edema or pneumothorax noted. IMPRESSION: 1. Progressed right pleural effusion that is moderate to large. 2. Cardiomegaly. 3. No pulmonary edema. Electronically Signed   By:  Monte Fantasia M.D.   On: 08/06/2016 09:37        Scheduled Meds: . sodium chloride   Intravenous Once  . aspirin EC  81 mg Oral Daily  . docusate sodium  100 mg Oral BID  . donepezil  10 mg Oral QHS  . ezetimibe-simvastatin  1 tablet Oral QHS  . ferrous sulfate  325 mg Oral BID WC  . furosemide  40 mg Oral Daily  . insulin aspart  0-9 Units Subcutaneous TID WC  . metoprolol tartrate  25 mg Oral BID  . nystatin   Topical TID  . pantoprazole  40 mg Oral Q0600  . phytonadione (VITAMIN K) IV  10 mg Intravenous Once  . saccharomyces boulardii  250 mg Oral BID  . Warfarin - Pharmacist Dosing Inpatient   Does not apply q1800   Continuous Infusions:    LOS: 13 days        ELGERGAWY, DAWOOD, MD [facility] Pager [phone]  If 7PM-7AM, please contact night-coverage [email or web address] Password [facility] 08/06/2016, 11:34 AM

## 2016-08-06 NOTE — Progress Notes (Signed)
Progress Note  Patient Name: [name]. Date of Encounter: 08/06/2016  Primary Cardiologist: Dr. Aundra Dubin  Subjective   No chest pain or SOB, no BM since admit, not much appetite either.  ? EGD today  Inpatient Medications    Scheduled Meds: . sodium chloride   Intravenous Once  . aspirin EC  81 mg Oral Daily  . docusate sodium  100 mg Oral BID  . donepezil  10 mg Oral QHS  . ezetimibe-simvastatin  1 tablet Oral QHS  . ferrous sulfate  325 mg Oral BID WC  . furosemide  40 mg Oral Daily  . insulin aspart  0-9 Units Subcutaneous TID WC  . metoprolol tartrate  25 mg Oral BID  . nystatin   Topical TID  . pantoprazole  40 mg Oral Q0600  . saccharomyces boulardii  250 mg Oral BID  . Warfarin - Pharmacist Dosing Inpatient   Does not apply q1800   Continuous Infusions:  PRN Meds: acetaminophen **OR** acetaminophen, albuterol, ipratropium, ondansetron **OR** ondansetron (ZOFRAN) IV   Vital Signs    Vitals:   08/05/16 0957 08/05/16 1200 08/05/16 2029 08/06/16 0522  BP: (!) 150/72 120/64 113/64 (!) 122/59  Pulse:  60 (!) 56 63  Resp: (!) 30 20 20  (!) 22  Temp:  98.1 F (36.7 C) 97.5 F (36.4 C) 98.3 F (36.8 C)  TempSrc:  Oral Oral Axillary  SpO2: 100% 96% 93% 98%  Weight:    195 lb 8 oz (88.7 kg)  Height:        Intake/Output Summary (Last 24 hours) at 08/06/16 0818 Last data filed at 08/06/16 0522  Gross per 24 hour  Intake              340 ml  Output              950 ml  Net             -610 ml   Filed Weights   08/03/16 0444 08/04/16 0529 08/06/16 0522  Weight: 200 lb 4.8 oz (90.9 kg) 178 lb 11.2 oz (81.1 kg) 195 lb 8 oz (88.7 kg)    Telemetry    V pacing with NSVT at 0300 10 beats - Personally Reviewed  ECG    No new - Personally Reviewed  Physical Exam   GEN: No acute distress.   Neck: No JVD Cardiac: RRR, no murmurs, rubs, or gallops.  Respiratory: Clear to auscultation bilaterally, ant. GI: Soft, nontender, non-distended  MS: tr edema  of lower ext; amputation Lt gt toe and second toe with wound vac, Rt. Heel wound Neuro:  Nonfocal  Psych: Normal affect   Labs    Chemistry Recent Labs Lab 08/01/16 0400 08/04/16 1032 08/06/16 0547  NA 136 137 137  K 4.5 4.4 4.9  CL 96* 98* 99*  CO2 31 32 31  GLUCOSE 154* 172* 147*  BUN 35* 29* 35*  CREATININE 0.98 0.89 0.88  CALCIUM 9.5 9.4 9.5  GFRNONAA >60 >60 >60  GFRAA >60 >60 >60  ANIONGAP 9 7 7      Hematology Recent Labs Lab 08/05/16 0454 08/05/16 0731 08/06/16 0547  WBC 12.8* 14.9* 12.8*  RBC 3.56* 3.56* 3.52*  HGB REPEATED TO VERIFY 9.7* 9.6*  HCT 31.2* 31.3* 31.5*  MCV 87.6 87.9 89.5  MCH 27.0 27.2 27.3  MCHC 30.8 31.0 30.5  RDW 15.5 15.5 16.0*  PLT 260 251 264    Cardiac EnzymesNo results for input(s): TROPONINI in the  last 168 hours. No results for input(s): TROPIPOC in the last 168 hours.   BNPNo results for input(s): BNP, PROBNP in the last 168 hours.   DDimer No results for input(s): DDIMER in the last 168 hours.   Radiology    No results found.  Cardiac Studies   Echo Study Conclusions  - Left ventricle: The cavity size was normal. There was moderate   concentric hypertrophy. Systolic function was normal. The   estimated ejection fraction was in the range of 55% to 60%. Wall   motion was normal; there were no regional wall motion   abnormalities. The study is not technically sufficient to allow   evaluation of LV diastolic function. - Ventricular septum: Septal motion showed paradox. These changes   are consistent with RV pressure overload. - Aortic valve: A bioprosthesis was present and functioning   normally. Valve area (VTI): 1.44 cm^2. Valve area (Vmax): 1.45   cm^2. Valve area (Vmean): 1.48 cm^2. - Mitral valve: Severely calcified annulus. The findings are   consistent with mild stenosis. There was mild to moderate   regurgitation directed centrally. Valve area by continuity   equation (using LVOT flow): 1.35 cm^2. - Left  atrium: The atrium was moderately to severely dilated. - Right ventricle: The cavity size was moderately dilated. Systolic   function was mildly to moderately reduced. - Tricuspid valve: There was moderate regurgitation directed   centrally. - Pulmonary arteries: Systolic pressure was severely increased. PA   peak pressure: 83 mm Hg (S).   Patient Profile     82 y.o. male with a PMH significant for diastolic heart failure, AS s/p AVR, CAD s/p CABG, sick sinus syndrome and complete heart block s/p PPM, atrial fibrillation on coumadin with hx of left atrial thrombus in 2012, PVD, COPD on home oxygen, DM, s/p left foot osteomyelitis s/p amputation (06/2016), and dementia. He presented to the ED after a fall and found to have a right humeral neck fracture. Cardiology was consulted for nonsustained VT on telemetry.  Assessment & Plan    1. NSVT  Continues with short bursts and isolated PVCs. Follow BMP and Magnesium.  Predominantly paced rhythm.  It appears arrhythmia burden is decreasing.  Titrate metoprolol as tolerated ad as needed.  He is not having any palpitations at this time. 10 beat run of NSVT early AM, K+ 4.9  Again not aware of NSVT  2. A fib/SSS/CHB/  PPM Cha2DS2VASc score of 6, on ASA.  Coumadin was on hold with recent procedures but has been resumed.  Hgb is 9.7, INR now 3  3.  Chronic diastolic HF  With pulmonary HTN.  PA pressure 83 mmHg.   4.  CAD hx CABG no chest pain  5.  Anemia - chronic now at hgb 9.6  6.  Lt foot osteomyelitis with amputation followed by Vascular and IM  7. Rt humeral neck fracture- treated with sling.   8. Constipation per IM and GI is on colace   9. Poor appetite ? Nutrition consult    Signed, Cecilie Kicks, NP  08/06/2016, 8:18 AM    I have examined the patient and reviewed assessment and plan and discussed with patient.  Agree with above as stated.  Planning for EGD tomorrow.  Arrhtyhmia well controlled.   Vitamin K may have a long  effect on INR.   Larae Grooms

## 2016-08-06 NOTE — Progress Notes (Signed)
Daily Rounding Note  08/06/2016, 11:58 AM  LOS: 13 days   SUBJECTIVE:   Chief complaint: weakness.  Improved.   No nausea, no BMs      OBJECTIVE:         Vital signs in last 24 hours:    Temp:  [97.5 F (36.4 C)-98.3 F (36.8 C)] 98.3 F (36.8 C) (02/14 0522) Pulse Rate:  [56-63] 63 (02/14 0522) Resp:  [20-22] 22 (02/14 0522) BP: (94-122)/(57-64) 94/57 (02/14 0830) SpO2:  [93 %-100 %] 100 % (02/14 0830) Weight:  [88.7 kg (195 lb 8 oz)] 88.7 kg (195 lb 8 oz) (02/14 0522) Last BM Date: 08/04/16 Filed Weights   08/03/16 0444 08/04/16 0529 08/06/16 0522  Weight: 90.9 kg (200 lb 4.8 oz) 81.1 kg (178 lb 11.2 oz) 88.7 kg (195 lb 8 oz)   General: ill appearing, pale.   Heart: RRR Chest: dyspneic with speech, sounds congested Abdomen: soft, NT, ND. Active BS.  Extremities: no CCE.  Wound vac on left foot.  Neuro/Psych:  Oriented x 3.  Alert.    Intake/Output from previous day: 02/13 0701 - 02/14 0700 In: 340 [P.O.:340] Out: 950 [Urine:950]  Intake/Output this shift: No intake/output data recorded.  Lab Results:  Recent Labs  08/05/16 0454 08/05/16 0731 08/06/16 0547  WBC 12.8* 14.9* 12.8*  HGB REPEATED TO VERIFY 9.7* 9.6*  HCT 31.2* 31.3* 31.5*  PLT 260 251 264   BMET  Recent Labs  08/04/16 1032 08/06/16 0547  NA 137 137  K 4.4 4.9  CL 98* 99*  CO2 32 31  GLUCOSE 172* 147*  BUN 29* 35*  CREATININE 0.89 0.88  CALCIUM 9.4 9.5   LFT No results for input(s): PROT, ALBUMIN, AST, ALT, ALKPHOS, BILITOT, BILIDIR, IBILI in the last 72 hours. PT/INR  Recent Labs  08/05/16 0454 08/06/16 0547  LABPROT 32.3* 36.2*  INR 3.07 3.53   Hepatitis Panel No results for input(s): HEPBSAG, HCVAB, HEPAIGM, HEPBIGM in the last 72 hours.  Studies/Results: Dg Chest Port 1 View  Result Date: 08/06/2016 CLINICAL DATA:  Hypoxia. EXAM: PORTABLE CHEST 1 VIEW COMPARISON:  Seven days prior FINDINGS: Progressive  haziness of the right chest with the appearance of layering pleural effusion, moderate or large. Better aerated left chest. Status post CABG and aortic valve replacement. Single chamber pacer from the left in stable position. No pulmonary edema or pneumothorax noted. IMPRESSION: 1. Progressed right pleural effusion that is moderate to large. 2. Cardiomegaly. 3. No pulmonary edema. Electronically Signed   By: Monte Fantasia M.D.   On: 08/06/2016 09:37    ASSESMENT:   *  Anemia, FOBT positive. Previous colonoscopies revealed internal hemorrhoids and hyperplastic colon polyps. Last colonoscopy 2005. No previous upper endoscopy. Patient not on PPI as outpatient or as inpatient.  GI review of systems unremarkable. Elevated BUN last week could be from upper GI bleed.  *  Right humeral fracture, nonoperative management.  *  Chronic Coumadin for history A. fib and atrial thrombus.  *  Left great toe osteomyelitis. Status post amputation of great and second toes. Wound VAC is draining bloody material.  *  ASPVD.    PLAN   *  EGD planned for today has been canceled due to INR of 3.5. The hospitalist has ordered a single dose of vitamin K 10 mg IV.  Recheck coags in the morning. Goal INR is 1.8 or less. *  Patient has EGD rescheduled for 10:30 on 08/08/15.  Azucena Freed  08/06/2016, 11:58 AM Pager: [phone]

## 2016-08-07 ENCOUNTER — Inpatient Hospital Stay (HOSPITAL_COMMUNITY): Payer: Medicare Other | Admitting: Certified Registered Nurse Anesthetist

## 2016-08-07 ENCOUNTER — Inpatient Hospital Stay (HOSPITAL_COMMUNITY): Payer: Medicare Other

## 2016-08-07 ENCOUNTER — Encounter (HOSPITAL_COMMUNITY): Payer: Self-pay | Admitting: Certified Registered Nurse Anesthetist

## 2016-08-07 ENCOUNTER — Encounter (HOSPITAL_COMMUNITY)
Admission: EM | Disposition: A | Discharge status: Skilled Nursing Facility | Payer: Self-pay | Source: Home / Self Care | Attending: Internal Medicine

## 2016-08-07 DIAGNOSIS — K250 Acute gastric ulcer with hemorrhage: Secondary | ICD-10-CM

## 2016-08-07 DIAGNOSIS — K922 Gastrointestinal hemorrhage, unspecified: Secondary | ICD-10-CM

## 2016-08-07 HISTORY — PX: ESOPHAGOGASTRODUODENOSCOPY (EGD) WITH PROPOFOL: SHX5813

## 2016-08-07 LAB — BASIC METABOLIC PANEL
Anion gap: 5 (ref 5–15)
BUN: 39 mg/dL — ABNORMAL HIGH (ref 6–20)
CALCIUM: 9.9 mg/dL (ref 8.9–10.3)
CHLORIDE: 100 mmol/L — AB (ref 101–111)
CO2: 33 mmol/L — ABNORMAL HIGH (ref 22–32)
Creatinine, Ser: 0.99 mg/dL (ref 0.61–1.24)
GFR calc Af Amer: >60 mL/min (ref >60)
Glucose, Bld: 183 mg/dL — ABNORMAL HIGH (ref 65–99)
Potassium: 4.5 mmol/L (ref 3.5–5.1)
Sodium: 138 mmol/L (ref 135–145)

## 2016-08-07 LAB — CBC
HCT: 30.1 % — ABNORMAL LOW (ref 39.0–52.0)
HEMOGLOBIN: 9 g/dL — AB (ref 13.0–17.0)
MCH: 27.1 pg (ref 26.0–34.0)
MCHC: 29.9 g/dL — AB (ref 30.0–36.0)
MCV: 90.7 fL (ref 78.0–100.0)
Platelets: 290 10*3/uL (ref 150–400)
RBC: 3.32 MIL/uL — ABNORMAL LOW (ref 4.22–5.81)
RDW: 15.8 % — ABNORMAL HIGH (ref 11.5–15.5)
WBC: 14.7 10*3/uL — ABNORMAL HIGH (ref 4.0–10.5)

## 2016-08-07 LAB — GLUCOSE, CAPILLARY
GLUCOSE-CAPILLARY: 175 mg/dL — AB (ref 65–99)
GLUCOSE-CAPILLARY: 142 mg/dL — AB (ref 65–99)
Glucose-Capillary: 149 mg/dL — ABNORMAL HIGH (ref 65–99)

## 2016-08-07 LAB — PROTIME-INR
INR: 1.45
Prothrombin Time: 17.7 seconds — ABNORMAL HIGH (ref 11.4–15.2)

## 2016-08-07 LAB — MAGNESIUM: MAGNESIUM: 1.8 mg/dL (ref 1.7–2.4)

## 2016-08-07 SURGERY — ESOPHAGOGASTRODUODENOSCOPY (EGD) WITH PROPOFOL
Anesthesia: Monitor Anesthesia Care

## 2016-08-07 MED ORDER — PANTOPRAZOLE SODIUM 40 MG PO TBEC
40.0000 mg | DELAYED_RELEASE_TABLET | Freq: Two times a day (BID) | ORAL | Status: DC
  Administered 2016-08-07 – 2016-08-09 (×4): 40 mg via ORAL
  Filled 2016-08-07 (×4): qty 1

## 2016-08-07 MED ORDER — SODIUM CHLORIDE 0.9 % IV SOLN
INTRAVENOUS | Status: DC
  Administered 2016-08-07: 10:00:00 via INTRAVENOUS

## 2016-08-07 MED ORDER — MEPERIDINE HCL 25 MG/ML IJ SOLN: 6.2500 mg | INTRAMUSCULAR | Status: DC | PRN

## 2016-08-07 MED ORDER — PROMETHAZINE HCL 25 MG/ML IJ SOLN: 6.2500 mg | INTRAMUSCULAR | Status: DC | PRN

## 2016-08-07 MED ORDER — LIDOCAINE HCL (PF) 1 % IJ SOLN
INTRAMUSCULAR | Status: AC
  Filled 2016-08-07: qty 10

## 2016-08-07 MED ORDER — PROPOFOL 10 MG/ML IV BOLUS
INTRAVENOUS | Status: DC | PRN
  Administered 2016-08-07: 20 mg via INTRAVENOUS

## 2016-08-07 MED ORDER — MIDAZOLAM HCL 2 MG/2ML IJ SOLN: 0.5000 mg | Freq: Once | INTRAMUSCULAR | Status: DC | PRN

## 2016-08-07 MED ORDER — LIDOCAINE 2% (20 MG/ML) 5 ML SYRINGE
INTRAMUSCULAR | Status: DC | PRN
  Administered 2016-08-07: 50 mg via INTRAVENOUS

## 2016-08-07 MED ORDER — PROPOFOL 500 MG/50ML IV EMUL
INTRAVENOUS | Status: DC | PRN
  Administered 2016-08-07: 75 ug/kg/min via INTRAVENOUS

## 2016-08-07 NOTE — Care Management Note (Addendum)
Case Management Note  Patient Details  Name: [name]. MRN: [name] Date of Birth: 1934/09/13  Subjective/Objective: Pt presented from [facility]- due to fall resulting in Rt humeral fracture. Pt with Chronic A Fib and Anemia-post 4 units PRBc's so far. . INR elevated- vitamin K given and INR down to 1.54 plan for EGD and thoracentesis. Plan will be to return to [facility] once stable. Pt may benefit from Palliative Care Consult. CM did discuss with Staff RN in progression meeting on 08-07-16.                  Action/Plan:  CSW is monitoring and CM will continue to follow as well.   Expected Discharge Date:                  Expected Discharge Plan:  [place]  In-House Referral:  Clinical Social Work  Discharge planning Services  CM Consult  Post Acute Care Choice:  NA Choice offered to:  NA  DME Arranged:  N/A DME Agency:  NA  HH Arranged:  NA HH Agency:  NA  Status of Service:  Completed, signed off  If discussed at [employer] of Stay Meetings, dates discussed:  08-07-16  Additional Comments: CM did contact Rickie with [employer] to deliver transport VAC to pt's room for transport to [employer]. [place] to get Loan[facility] back to [facility]. CSW has relayed information to [facility]. No further needs from CM at this time.  Bethena Roys, RN 08/07/2016, 3:01 PM

## 2016-08-07 NOTE — Anesthesia Preprocedure Evaluation (Signed)
Anesthesia Evaluation  Patient identified by MRN, date of birth, ID band Patient awake    Reviewed: Allergy & Precautions, NPO status , Patient's Chart, lab work & pertinent test results  History of Anesthesia Complications Negative for: history of anesthetic complications  Airway Mallampati: II       Dental  (+) Poor Dentition, Caps, Dental Advisory Given, Missing   Pulmonary sleep apnea (does not use CPAP) , COPD, former smoker,    breath sounds clear to auscultation       Cardiovascular hypertension, Pt. on medications + CAD, + CABG and + Peripheral Vascular Disease  + pacemaker + Valvular Problems/Murmurs (s/p AVR)  Rhythm:Regular Rate:Normal     Neuro/Psych negative neurological ROS     GI/Hepatic Elevated LFTs GI bleed   Endo/Other  diabetes, Insulin Dependent  Renal/GU negative Renal ROS     Musculoskeletal   Abdominal   Peds  Hematology Coumadin   Anesthesia Other Findings   Reproductive/Obstetrics                             Anesthesia Physical Anesthesia Plan  ASA: III  Anesthesia Plan: MAC   Post-op Pain Management:    Induction: Intravenous  Airway Management Planned: Natural Airway and Nasal Cannula  Additional Equipment:   Intra-op Plan:   Post-operative Plan:   Informed Consent: I have reviewed the patients History and Physical, chart, labs and discussed the procedure including the risks, benefits and alternatives for the proposed anesthesia with the patient or authorized representative who has indicated his/her understanding and acceptance.   Dental advisory given  Plan Discussed with: CRNA and Surgeon  Anesthesia Plan Comments: (Plan routine monitors, MAC)        Anesthesia Quick Evaluation

## 2016-08-07 NOTE — Interval H&P Note (Signed)
History and Physical Interval Note:  08/07/2016 10:51 AM  [name].  has presented today for surgery, with the diagnosis of FOBT positive. Anemia.  The various methods of treatment have been discussed with the patient and family. After consideration of risks, benefits and other options for treatment, the patient has consented to  Procedure(s): ESOPHAGOGASTRODUODENOSCOPY (EGD) WITH PROPOFOL (N/A) as a surgical intervention .  The patient's history has been reviewed, patient examined, no change in status, stable for surgery.  I have reviewed the patient's chart and labs.  Questions were answered to the patient's satisfaction.     Kavitha Nandigam

## 2016-08-07 NOTE — Anesthesia Preprocedure Evaluation (Deleted)
Anesthesia Evaluation  Patient identified by MRN, date of birth, ID band Patient awake    Reviewed: Allergy & Precautions, NPO status , Patient's Chart, lab work & pertinent test results, reviewed documented beta blocker date and time   Airway Mallampati: II  TM Distance: <3 FB Neck ROM: Full    Dental  (+) Teeth Intact, Dental Advisory Given   Pulmonary former smoker,           Cardiovascular hypertension, Pt. on medications and Pt. on home beta blockers      Neuro/Psych    GI/Hepatic   Endo/Other  diabetes  Renal/GU      Musculoskeletal   Abdominal   Peds  Hematology   Anesthesia Other Findings   Reproductive/Obstetrics                             Anesthesia Physical Anesthesia Plan  ASA: III  Anesthesia Plan: General   Post-op Pain Management:    Induction: Intravenous  Airway Management Planned: Oral ETT  Additional Equipment:   Intra-op Plan:   Post-operative Plan: Extubation in OR  Informed Consent: I have reviewed the patients History and Physical, chart, labs and discussed the procedure including the risks, benefits and alternatives for the proposed anesthesia with the patient or authorized representative who has indicated his/her understanding and acceptance.   Dental advisory given  Plan Discussed with: CRNA, Anesthesiologist and Surgeon  Anesthesia Plan Comments:         Anesthesia Quick Evaluation

## 2016-08-07 NOTE — H&P (View-Only) (Signed)
Daily Rounding Note  08/06/2016, 11:58 AM  LOS: 13 days   SUBJECTIVE:   Chief complaint: weakness.  Improved.   No nausea, no BMs      OBJECTIVE:         Vital signs in last 24 hours:    Temp:  [97.5 F (36.4 C)-98.3 F (36.8 C)] 98.3 F (36.8 C) (02/14 0522) Pulse Rate:  [56-63] 63 (02/14 0522) Resp:  [20-22] 22 (02/14 0522) BP: (94-122)/(57-64) 94/57 (02/14 0830) SpO2:  [93 %-100 %] 100 % (02/14 0830) Weight:  [88.7 kg (195 lb 8 oz)] 88.7 kg (195 lb 8 oz) (02/14 0522) Last BM Date: 08/04/16 Filed Weights   08/03/16 0444 08/04/16 0529 08/06/16 0522  Weight: 90.9 kg (200 lb 4.8 oz) 81.1 kg (178 lb 11.2 oz) 88.7 kg (195 lb 8 oz)   General: ill appearing, pale.   Heart: RRR Chest: dyspneic with speech, sounds congested Abdomen: soft, NT, ND. Active BS.  Extremities: no CCE.  Wound vac on left foot.  Neuro/Psych:  Oriented x 3.  Alert.    Intake/Output from previous day: 02/13 0701 - 02/14 0700 In: 340 [P.O.:340] Out: 950 [Urine:950]  Intake/Output this shift: No intake/output data recorded.  Lab Results:  Recent Labs  08/05/16 0454 08/05/16 0731 08/06/16 0547  WBC 12.8* 14.9* 12.8*  HGB REPEATED TO VERIFY 9.7* 9.6*  HCT 31.2* 31.3* 31.5*  PLT 260 251 264   BMET  Recent Labs  08/04/16 1032 08/06/16 0547  NA 137 137  K 4.4 4.9  CL 98* 99*  CO2 32 31  GLUCOSE 172* 147*  BUN 29* 35*  CREATININE 0.89 0.88  CALCIUM 9.4 9.5   LFT No results for input(s): PROT, ALBUMIN, AST, ALT, ALKPHOS, BILITOT, BILIDIR, IBILI in the last 72 hours. PT/INR  Recent Labs  08/05/16 0454 08/06/16 0547  LABPROT 32.3* 36.2*  INR 3.07 3.53   Hepatitis Panel No results for input(s): HEPBSAG, HCVAB, HEPAIGM, HEPBIGM in the last 72 hours.  Studies/Results: Dg Chest Port 1 View  Result Date: 08/06/2016 CLINICAL DATA:  Hypoxia. EXAM: PORTABLE CHEST 1 VIEW COMPARISON:  Seven days prior FINDINGS: Progressive  haziness of the right chest with the appearance of layering pleural effusion, moderate or large. Better aerated left chest. Status post CABG and aortic valve replacement. Single chamber pacer from the left in stable position. No pulmonary edema or pneumothorax noted. IMPRESSION: 1. Progressed right pleural effusion that is moderate to large. 2. Cardiomegaly. 3. No pulmonary edema. Electronically Signed   By: Monte Fantasia M.D.   On: 08/06/2016 09:37    ASSESMENT:   *  Anemia, FOBT positive. Previous colonoscopies revealed internal hemorrhoids and hyperplastic colon polyps. Last colonoscopy 2005. No previous upper endoscopy. Patient not on PPI as outpatient or as inpatient.  GI review of systems unremarkable. Elevated BUN last week could be from upper GI bleed.  *  Right humeral fracture, nonoperative management.  *  Chronic Coumadin for history A. fib and atrial thrombus.  *  Left great toe osteomyelitis. Status post amputation of great and second toes. Wound VAC is draining bloody material.  *  ASPVD.    PLAN   *  EGD planned for today has been canceled due to INR of 3.5. The hospitalist has ordered a single dose of vitamin K 10 mg IV.  Recheck coags in the morning. Goal INR is 1.8 or less. *  Patient has EGD rescheduled for 10:30 on 08/08/15.  Azucena Freed  08/06/2016, 11:58 AM Pager: [phone]

## 2016-08-07 NOTE — Transfer of Care (Signed)
Immediate Anesthesia Transfer of Care Note  Patient: [name].  Procedure(s) Performed: Procedure(s): ESOPHAGOGASTRODUODENOSCOPY (EGD) WITH PROPOFOL (N/A)  Patient Location: Endoscopy Unit  Anesthesia Type:MAC  Level of Consciousness: awake, alert  and oriented  Airway & Oxygen Therapy: Patient Spontanous Breathing and Patient connected to nasal cannula oxygen  Post-op Assessment: Report given to RN and Post -op Vital signs reviewed and stable  Post vital signs: Reviewed and stable  Last Vitals:  Vitals:   08/07/16 0901 08/07/16 0950  BP:  (!) 124/44  Pulse:  60  Resp: (!) 27 20  Temp:  36.4 C    Last Pain:  Vitals:   08/07/16 0950  TempSrc: Oral  PainSc:       Patients Stated Pain Goal: 0 (85/92/92 4462)  Complications: No apparent anesthesia complications

## 2016-08-07 NOTE — Anesthesia Procedure Notes (Signed)
Procedure Name: MAC Date/Time: 08/07/2016 10:52 AM Performed by: Candis Shine Pre-anesthesia Checklist: Patient identified, Emergency Drugs available, Suction available, Patient being monitored and Timeout performed Patient Re-evaluated:Patient Re-evaluated prior to inductionOxygen Delivery Method: Nasal cannula Dental Injury: Teeth and Oropharynx as per pre-operative assessment

## 2016-08-07 NOTE — Anesthesia Postprocedure Evaluation (Signed)
Anesthesia Post Note  Patient: [name].  Procedure(s) Performed: Procedure(s) (LRB): ESOPHAGOGASTRODUODENOSCOPY (EGD) WITH PROPOFOL (N/A)  Patient location during evaluation: Endoscopy Anesthesia Type: MAC Level of consciousness: awake and alert and patient cooperative Pain management: pain level controlled Vital Signs Assessment: post-procedure vital signs reviewed and stable Respiratory status: spontaneous breathing, nonlabored ventilation, respiratory function stable and patient connected to face mask oxygen Cardiovascular status: blood pressure returned to baseline and stable Postop Assessment: no signs of nausea or vomiting Anesthetic complications: no       Last Vitals:  Vitals:   08/07/16 1150 08/07/16 1200  BP: (!) 152/39 (!) 146/41  Pulse: 60 60  Resp: (!) 25 (!) 24  Temp:      Last Pain:  Vitals:   08/07/16 1120  TempSrc: Axillary  PainSc:                  Shirl Ludington,E. Lanny Lipkin

## 2016-08-07 NOTE — Progress Notes (Signed)
Progress Note  Patient Name: [name]. Date of Encounter: 08/07/2016  Primary Cardiologist: Dr. Aundra Dubin  Subjective   No complaints  Inpatient Medications    Scheduled Meds: . sodium chloride   Intravenous Once  . aspirin EC  81 mg Oral Daily  . docusate sodium  100 mg Oral BID  . donepezil  10 mg Oral QHS  . ezetimibe-simvastatin  1 tablet Oral QHS  . ferrous sulfate  325 mg Oral BID WC  . furosemide  40 mg Oral Daily  . insulin aspart  0-9 Units Subcutaneous TID WC  . metoprolol tartrate  25 mg Oral BID  . nystatin   Topical TID  . pantoprazole  40 mg Oral Q0600  . saccharomyces boulardii  250 mg Oral BID  . Warfarin - Pharmacist Dosing Inpatient   Does not apply q1800   Continuous Infusions: . sodium chloride     PRN Meds: acetaminophen **OR** acetaminophen, albuterol, ipratropium, ondansetron **OR** ondansetron (ZOFRAN) IV   Vital Signs    Vitals:   08/06/16 2141 08/06/16 2215 08/07/16 0215 08/07/16 0549  BP: 125/62 (!) 127/48 (!) 103/48 (!) 103/48  Pulse: 63   66  Resp: 15   (!) 21  Temp: 97.7 F (36.5 C)   97.5 F (36.4 C)  TempSrc: Axillary   Axillary  SpO2: 97% 93% 97% 100%  Weight:    196 lb 3.4 oz (89 kg)  Height:        Intake/Output Summary (Last 24 hours) at 08/07/16 0805 Last data filed at 08/07/16 0555  Gross per 24 hour  Intake              320 ml  Output             1000 ml  Net             -680 ml   Filed Weights   08/04/16 0529 08/06/16 0522 08/07/16 0549  Weight: 178 lb 11.2 oz (81.1 kg) 195 lb 8 oz (88.7 kg) 196 lb 3.4 oz (89 kg)    Telemetry    V pacing with short burst this am of NSVT, though not freq. episodes. - Personally Reviewed  ECG    No new - Personally Reviewed  Physical Exam   GEN: No acute distress.   Neck: No JVD Cardiac: RRR, no murmurs, rubs, or gallops.  Respiratory: Clear to auscultation bilaterally. GI: Soft, nontender, non-distended  MS: No edema; Lt great toes and second toe amputated  with eschar and wound vac , rt heel with ulcer Neuro:  Nonfocal  Psych: Normal affect   Labs    Chemistry Recent Labs Lab 08/04/16 1032 08/06/16 0547 08/07/16 0407  NA 137 137 138  K 4.4 4.9 4.5  CL 98* 99* 100*  CO2 32 31 33*  GLUCOSE 172* 147* 183*  BUN 29* 35* 39*  CREATININE 0.89 0.88 0.99  CALCIUM 9.4 9.5 9.9  GFRNONAA >60 >60 >60  GFRAA >60 >60 >60  ANIONGAP 7 7 5      Hematology Recent Labs Lab 08/05/16 0731 08/06/16 0547 08/07/16 0407  WBC 14.9* 12.8* 14.7*  RBC 3.56* 3.52* 3.32*  HGB 9.7* 9.6* 9.0*  HCT 31.3* 31.5* 30.1*  MCV 87.9 89.5 90.7  MCH 27.2 27.3 27.1  MCHC 31.0 30.5 29.9*  RDW 15.5 16.0* 15.8*  PLT 251 264 290    Cardiac EnzymesNo results for input(s): TROPONINI in the last 168 hours. No results for input(s): TROPIPOC in the last  168 hours.   BNPNo results for input(s): BNP, PROBNP in the last 168 hours.   DDimer No results for input(s): DDIMER in the last 168 hours.   Radiology    Dg Chest Port 1 View  Result Date: 08/06/2016 CLINICAL DATA:  Hypoxia. EXAM: PORTABLE CHEST 1 VIEW COMPARISON:  Seven days prior FINDINGS: Progressive haziness of the right chest with the appearance of layering pleural effusion, moderate or large. Better aerated left chest. Status post CABG and aortic valve replacement. Single chamber pacer from the left in stable position. No pulmonary edema or pneumothorax noted. IMPRESSION: 1. Progressed right pleural effusion that is moderate to large. 2. Cardiomegaly. 3. No pulmonary edema. Electronically Signed   By: Monte Fantasia M.D.   On: 08/06/2016 09:37    Cardiac Studies   Echo Study Conclusions  - Left ventricle: The cavity size was normal. There was moderate concentric hypertrophy. Systolic function was normal. The estimated ejection fraction was in the range of 55% to 60%. Wall motion was normal; there were no regional wall motion abnormalities. The study is not technically sufficient to  allow evaluation of LV diastolic function. - Ventricular septum: Septal motion showed paradox. These changes are consistent with RV pressure overload. - Aortic valve: A bioprosthesis was present and functioning normally. Valve area (VTI): 1.44 cm^2. Valve area (Vmax): 1.45 cm^2. Valve area (Vmean): 1.48 cm^2. - Mitral valve: Severely calcified annulus. The findings are consistent with mild stenosis. There was mild to moderate regurgitation directed centrally. Valve area by continuity equation (using LVOT flow): 1.35 cm^2. - Left atrium: The atrium was moderately to severely dilated. - Right ventricle: The cavity size was moderately dilated. Systolic function was mildly to moderately reduced. - Tricuspid valve: There was moderate regurgitation directed centrally. - Pulmonary arteries: Systolic pressure was severely increased. PA peak pressure: 83 mm Hg (S).  Patient Profile     82 y.o. male with a PMH significant for diastolic heart failure, AS s/p AVR, CAD s/p CABG, sick sinus syndrome and complete heart block s/p PPM, atrial fibrillation on coumadin with hx of left atrial thrombus in 2012, PVD, COPD on home oxygen, DM, s/p left foot osteomyelitis s/p amputation (06/2016), and dementia. He presented to the ED after a fall and found to have a right humeral neck fracture. Cardiology was consulted for nonsustained VT on telemetry.   Assessment & Plan    1. NSVT Continues with short bursts and isolated PVCs. FollowBMP and Magnesium. Predominantly paced rhythm. It appears arrhythmia burden is decreasing. Titrate metoprolol as tolerated ad as needed. He is not having any palpitations at this time. 10 beat run of NSVT early AM, K+ 4.5  Again not aware of NSVT  2. A fib/SSS/CHB/ PPM Cha2DS2VASc score of 6, on ASA. Coumadin was on hold with recent procedures but has been resumed. Hgb is 9.0,INR now 1.45 after receiving Vit K yesterday.    3. Chronic diastolic HF  With pulmonary HTN.  PA pressure 83 mmHg.  stable  4. CAD hx CABG no chest pain  5. Anemia - chronic now at hgb 9.0 for EGD today  6. Lt foot osteomyelitis with amputation followed by Vascular and IM  7. Rt humeral neck fracture- treated with sling.   8. Constipation per IM and GI is on colace   9. Poor appetite ? Nutrition consult   10.  Rt pl effusion that is mod to large.      Signed, Cecilie Kicks, NP  08/07/2016, 8:05 AM  INR decreased, EGD today.

## 2016-08-07 NOTE — Op Note (Addendum)
[facility] Patient Name: [name] Procedure Date : 08/07/2016 MRN: [name] Attending MD: Mauri Pole , MD Date of Birth: 30-Jan-1935 CSN: [id number] Age: 82 Admit Type: Inpatient Procedure:                Upper GI endoscopy Indications:              Recent gastrointestinal bleeding, Suspected upper                            gastrointestinal bleeding Providers:                Mauri Pole, MD, Vista Lawman, RN, Cletis Athens, Technician Referring MD:              Medicines:                Monitored Anesthesia Care Complications:            No immediate complications. Estimated Blood Loss:     Estimated blood loss was minimal. Procedure:                Pre-Anesthesia Assessment:                           - Prior to the procedure, a History and Physical                            was performed, and patient medications and                            allergies were reviewed. The patient's tolerance of                            previous anesthesia was also reviewed. The risks                            and benefits of the procedure and the sedation                            options and risks were discussed with the patient.                            All questions were answered, and informed consent                            was obtained. Prior Anticoagulants: The patient                            last took Coumadin (warfarin) 2 days prior to the                            procedure. ASA Grade Assessment: IV - A patient  with severe systemic disease that is a constant                            threat to life. After reviewing the risks and                            benefits, the patient was deemed in satisfactory                            condition to undergo the procedure.                           After obtaining informed consent, the endoscope was                            passed under direct  vision. Throughout the                            procedure, the patient's blood pressure, pulse, and                            oxygen saturations were monitored continuously. The                            [id number]) scope was introduced through the                            mouth, and advanced to the second part of duodenum.                            The upper GI endoscopy was accomplished without                            difficulty. The patient tolerated the procedure                            well. Scope In: Scope Out: Findings:      The esophagus was normal.      Two oozing superficial gastric ulcers with pigmented material were found       in the gastric fundus, oozing with light touch from one of the lesions.       The largest lesion was 4 mm in largest dimension. For hemostasis, one       hemostatic clip was successfully placed (MR conditional). Biopsies were       taken with a cold forceps for histology from adjacent to the ulcer       appeared granular with friable mucosa.      The examined duodenum was normal. Impression:               - Normal esophagus.                           - Oozing gastric ulcers with pigmented material in  the gastric fundus. Clip (MR conditional) was                            placed. Biopsied. Likely secondary to pill gastritis                           - Normal examined duodenum. Moderate Sedation:      N/A Recommendation:           - Mechanical soft diet.                           - Continue present medications.                           - No ibuprofen, naproxen, or other non-steroidal                            anti-inflammatory drugs.                           - Use Protonix (pantoprazole) 40 mg PO BID.                           - Ok to restart coumadin tomorrow                           - Please call with any questions Procedure Code(s):        --- Professional ---                           [phone],  59, Esophagogastroduodenoscopy, flexible,                            transoral; with control of bleeding, any method                           43239, Esophagogastroduodenoscopy, flexible,                            transoral; with biopsy, single or multiple Diagnosis Code(s):        --- Professional ---                           K25.4, Chronic or unspecified gastric ulcer with                            hemorrhage                           K92.2, Gastrointestinal hemorrhage, unspecified CPT copyright 2016 [employer]. All rights reserved. The codes documented in this report are preliminary and upon coder review may  be revised to meet current compliance requirements. Mauri Pole, MD 08/07/2016 11:43:41 AM This report has been signed electronically. Number of Addenda: 0

## 2016-08-07 NOTE — Procedures (Signed)
PROCEDURE SUMMARY:  Successful US guided right thoracentesis. Yielded 2.3 L of clear, amber colored fluid. Pt tolerated procedure well. No immediate complications.  Specimen was not sent for labs. CXR ordered.  Ascencion Dike PA-C 08/07/2016 4:03 PM

## 2016-08-07 NOTE — Progress Notes (Signed)
Pt wife states that she has noticed a new concerning spot on the pt left foot and would like the MD to evaluate. Attending physician notified and advised to notify the surgery team. I paged the surgery team's PA. Awaiting answer.  Ferdinand Lango, RN

## 2016-08-07 NOTE — Progress Notes (Addendum)
PROGRESS NOTE  [name].  [id number] DOB: 13-Jul-1934  DOA: 07/24/2016 PCP: [name], MD   Brief Narrative:  82 year old male with PMH of severe AS s/p redo AVR (bioprosthetic valve), chronic diastolic CHF, CAD status post CABG, complete heart block s/p PPM, A. fib on chronic Coumadin, history of left atrial thrombus by TEE 7/12, PAD, COPD on home oxygen 2 L/m, type II DM, HTN, HLD, OSA not on CPAP, left foot osteomyelitis status post amputation of great toe, Alzheimer's dementia, recent hospitalization 1/11-1/29 for acute respiratory failure with hypoxia, noted to have a large right-sided pleural effusion for which she underwent thoracentesis, diabetic foot ulcer with osteomyelitis treated with antibiotics and then discharged to [facility] for 3 days, presented back to the ED following a fall on day of admission, found on floor. Also noted more lethargic than usual and worsening short-term memory. X-rays confirmed right humeral neck fracture. Hemoglobin 7.3. Vascular surgery consulted for left foot wound management-underwent I&D of left great toe on 2/6 and 2/8. Cardiology consulted for NSVT. Orthopedics consulted for right humerus fracture and recommended nonoperative management.   Assessment & Plan:   Principal Problem:   Right humeral fracture Active Problems:   DM (diabetes mellitus) type II uncontrolled, periph vascular disorder ([name])   S/P AVR (aortic valve replacement)   Chronic atrial fibrillation ([name])   Chronic diastolic heart failure ([name])   Pleural effusion   Anemia   Supratherapeutic INR   Chronic respiratory failure ([place])   Fall   Alzheimer's dementia   Fracture of humerus neck, right, closed, initial encounter   NSVT (nonsustained ventricular tachycardia) ([name])   PVC (premature ventricular contraction)   Right humeral neck fracture:  - Sustained status post fall at SNF. X-ray of right shoulder 07/24/16 confirmed displaced mildly comminuted humeral neck  fracture and no evidence of intra-articular extension. Continue right shoulder sling. Orthopedics consultation appreciated: Confirmed right proximal humerus fracture of mainly the surgical neck which can be treated with just a sling. Outpatient follow-up with Dr. Jean Rosenthal in 2 weeks.  Acute on chronic anemia: Upper GI bleed secondary to gastric ulcer - Hemoglobin 12 g in December 2017 but has progressively dropped and was 7.3 on admission. So far required 4 units PRBC transfusion this hospital stay, he was Hemoccult positive, as well as dark colored stool on my physical exam,  - GI consult appreciated, went for EGD today, significant for losing gastric ulcer status post clipping - Continue with Protonix 40 mg twice a day, follow on biopsy, and okay to resume warfarin tomorrow per GI.  Chronic A. fib:  -Presented with supratherapeutic INR of 3.6. Received vitamin K 1 mg on admission. Coumadin held. CHA2DS2VASc score is 6. Metoprolol started by cardiology for NSVT which should help A. fib too. Initially bridging with IV heparin,  - If vitamin K for supratherapeutic INR in anticipation for procedures today, okay to resume warfarin tomorrow  Right-sided pleural effusion, recurrent:  - Status post 2.1 L thoracentesis on 2/5 with improved follow-up chest x-ray. Transudative, evidence of reaccumulation of pleural fluid on x-ray today, and increased oxygen demand, so will request ultrasound-guided thoracentesis.  Chronic hypoxic respiratory failure:  - On home oxygen 2 L/m. Some concern for dysphagia on 2/8. Speech therapy evaluated 2/9 and recommend regular consistency diet and thin liquids.  Stage III chronic kidney disease:  - Stable.  Mild hyperkalemia:  - Resolved.  Chronic diastolic CHF:  - Clinically euvolemic. Continue oral Lasix.   CAD status post  CABG: -  Asymptomatic of chest pain.  Complete heart block status post PPM:  - Monitor on telemetry which also showed episodes  of NSVT.   Status post bioprosthetic AVR  Type II DM: - Reasonable inpatient control on SSI.  Essential hypertension: Management as above.  S/P angioplasty and stenting LLE and left great toe amputation:  - As per vascular surgery follow-up, underwent excisional debridement of left great toe amputation site on 2/6 night and repeat I&D in all OR on 2/8 followed by negative pressure dressing. Vascular surgery has seen and cleared for discharge. Outpatient follow-up with vascular surgery.  Nonsustained VT:  - Monitored on telemetry. Cardiology consultation and follow-up appreciated. Started on metoprolol. 2-D echo 07/31/16: LVEF 55-60 percent, normal wall motion without regional wall motion abnormalities and severe pulmonary hypertension. Metoprolol started and Increased metoprolol to 25 MG twice a day. NSVT burden has decreased. Potassium 4.4. Magnesium 1.8. Replace to keep >2.  Dementia:  - Moderate at least. Continue Aricept.  Adult failure to thrive: - Apparently has been declining for some time now. Consider palliative care consultation for goals of care , can be [facility] at [facility]..   DVT prophylaxis: on warfarin  Code Status: Full Family Communication: No family at bedside. Disposition Plan: DC back to SNF when medically stable, hopefully in 1-2 days   Consultants:   Vascular surgery  Interventional radiology  Cardiology   Orthopedics  Gastroenterology  Procedures:   Therapeutic right thoracentesis on 07/28/16  Left great toe amputation site debridement by vascular surgery on 07/29/16  I&D left great toe 07/31/16  EGD 2/15 bilateral lower GI, significant for losing gastric ulcer  2-D echo 07/31/16: Study Conclusions  - Left ventricle: The cavity size was normal. There was moderate   concentric hypertrophy. Systolic function was normal. The   estimated ejection fraction was in the range of 55% to 60%. Wall   motion was normal; there were no regional wall motion    abnormalities. The study is not technically sufficient to allow   evaluation of LV diastolic function. - Ventricular septum: Septal motion showed paradox. These changes   are consistent with RV pressure overload. - Aortic valve: A bioprosthesis was present and functioning   normally. Valve area (VTI): 1.44 cm^2. Valve area (Vmax): 1.45   cm^2. Valve area (Vmean): 1.48 cm^2. - Mitral valve: Severely calcified annulus. The findings are   consistent with mild stenosis. There was mild to moderate   regurgitation directed centrally. Valve area by continuity   equation (using LVOT flow): 1.35 cm^2. - Left atrium: The atrium was moderately to severely dilated. - Right ventricle: The cavity size was moderately dilated. Systolic   function was mildly to moderately reduced. - Tricuspid valve: There was moderate regurgitation directed   centrally. - Pulmonary arteries: Systolic pressure was severely increased. PA   peak pressure: 83 mm Hg (S).   Antimicrobials:   None    Subjective: Patient denies any complaints, no chest pain, no shortness of breath, no headache, no nausea or vomiting  Objective:  Vitals:   08/07/16 1135 08/07/16 1140 08/07/16 1150 08/07/16 1200  BP:  (!) 143/40 (!) 152/39 (!) 146/41  Pulse:  (!) 59 60 60  Resp:  17 (!) 25 (!) 24  Temp:      TempSrc:      SpO2: 99% 96% 100% 95%  Weight:      Height:        Intake/Output Summary (Last 24 hours) at 08/07/16 1218 Last data  filed at 08/07/16 1111  Gross per 24 hour  Intake              620 ml  Output             1000 ml  Net             -380 ml   Filed Weights   08/04/16 0529 08/06/16 0522 08/07/16 0549  Weight: 81.1 kg (178 lb 11.2 oz) 88.7 kg (195 lb 8 oz) 89 kg (196 lb 3.4 oz)    Examination:  General exam: Pleasant elderly male, moderately built and frail, chronically ill looking, lying comfortably propped up in bed. Respiratory system: Diminished breath sounds in the Right base, otherwise clear to  auscultation. Respiratory effort normal. Cardiovascular system: S1 & S2 heard, RRR. No JVD, murmurs, rubs, gallops or clicks. No pedal edema. Telemetry: V paced rhythm.  3-5 beat episodes of NSVT 2 in the last 24 hours. Gastrointestinal system: Abdomen is nondistended, soft and nontender. No organomegaly or masses felt. Normal bowel sounds heard. Central nervous system: Alert and oriented 2. No focal neurological deficits. Extremities: Symmetric 5 x 5 power except right upper extremity which is immobilized in a sling.. Skin: Left great toe site wound VAC intact and draining slightly bloody fluid . Psychiatry: Judgement and insight impaired. Mood & affect appropriate.     Data Reviewed: I have personally reviewed following labs and imaging studies  CBC:  Recent Labs Lab 08/04/16 1032 08/05/16 0454 08/05/16 0731 08/06/16 0547 08/07/16 0407  WBC 11.5* 12.8* 14.9* 12.8* 14.7*  HGB 7.6* REPEATED TO VERIFY 9.7* 9.6* 9.0*  HCT 25.7* 31.2* 31.3* 31.5* 30.1*  MCV 88.9 87.6 87.9 89.5 90.7  PLT 292 260 251 264 [id number]   Basic Metabolic Panel:  Recent Labs Lab 08/01/16 0400 08/04/16 1032 08/06/16 0547 08/07/16 0407  NA 136 137 137 138  K 4.5 4.4 4.9 4.5  CL 96* 98* 99* 100*  CO2 31 32 31 33*  GLUCOSE 154* 172* 147* 183*  BUN 35* 29* 35* 39*  CREATININE 0.98 0.89 0.88 0.99  CALCIUM 9.5 9.4 9.5 9.9  MG  --  1.8  --  1.8   GFR: Estimated Creatinine Clearance: 67.9 mL/min (by C-G formula based on SCr of 0.99 mg/dL). Liver Function Tests: No results for input(s): AST, ALT, ALKPHOS, BILITOT, PROT, ALBUMIN in the last 168 hours. No results for input(s): LIPASE, AMYLASE in the last 168 hours. No results for input(s): AMMONIA in the last 168 hours. Coagulation Profile:  Recent Labs Lab 08/04/16 1032 08/05/16 0454 08/06/16 0547 08/06/16 1911 08/07/16 0407  INR 2.89 3.07 3.53 1.80 1.45   Cardiac Enzymes: No results for input(s): CKTOTAL, CKMB, CKMBINDEX, TROPONINI in the last  168 hours. BNP (last 3 results) No results for input(s): PROBNP in the last 8760 hours. HbA1C: No results for input(s): HGBA1C in the last 72 hours. CBG:  Recent Labs Lab 08/06/16 0837 08/06/16 1119 08/06/16 1638 08/06/16 2147 08/07/16 0756  GLUCAP 154* 142* 154* 140* 175*   Lipid Profile: No results for input(s): CHOL, HDL, LDLCALC, TRIG, CHOLHDL, LDLDIRECT in the last 72 hours. Thyroid Function Tests: No results for input(s): TSH, T4TOTAL, FREET4, T3FREE, THYROIDAB in the last 72 hours. Anemia Panel: No results for input(s): VITAMINB12, FOLATE, FERRITIN, TIBC, IRON, RETICCTPCT in the last 72 hours.  Sepsis Labs:  Recent Labs Lab 08/05/16 0454 08/05/16 0731 08/06/16 0547 08/07/16 0407  WBC 12.8* 14.9* 12.8* 14.7*    Recent Results (from the past 240  hour(s))  Culture, body fluid-bottle     Status: None   Collection Time: 07/28/16  3:09 PM  Result Value Ref Range Status   Specimen Description FLUID RIGHT PLEURAL  Final   Special Requests BOTTLES DRAWN AEROBIC AND ANAEROBIC 10CC  Final   Culture NO GROWTH 5 DAYS  Final   Report Status 08/02/2016 FINAL  Final  Gram stain     Status: None   Collection Time: 07/28/16  3:09 PM  Result Value Ref Range Status   Specimen Description FLUID RIGHT PLEURAL  Final   Special Requests NONE  Final   Gram Stain   Final    RARE WBC PRESENT, PREDOMINANTLY PMN NO ORGANISMS SEEN    Report Status 07/28/2016 FINAL  Final         Radiology Studies: Dg Chest Port 1 View  Result Date: 08/06/2016 CLINICAL DATA:  Hypoxia. EXAM: PORTABLE CHEST 1 VIEW COMPARISON:  Seven days prior FINDINGS: Progressive haziness of the right chest with the appearance of layering pleural effusion, moderate or large. Better aerated left chest. Status post CABG and aortic valve replacement. Single chamber pacer from the left in stable position. No pulmonary edema or pneumothorax noted. IMPRESSION: 1. Progressed right pleural effusion that is moderate to  large. 2. Cardiomegaly. 3. No pulmonary edema. Electronically Signed   By: Monte Fantasia M.D.   On: 08/06/2016 09:37        Scheduled Meds: . [MAR Hold] sodium chloride   Intravenous Once  . [MAR Hold] aspirin EC  81 mg Oral Daily  . [MAR Hold] docusate sodium  100 mg Oral BID  . [MAR Hold] donepezil  10 mg Oral QHS  . [MAR Hold] ezetimibe-simvastatin  1 tablet Oral QHS  . [MAR Hold] ferrous sulfate  325 mg Oral BID WC  . [MAR Hold] furosemide  40 mg Oral Daily  . [MAR Hold] insulin aspart  0-9 Units Subcutaneous TID WC  . [MAR Hold] metoprolol tartrate  25 mg Oral BID  . [MAR Hold] nystatin   Topical TID  . pantoprazole  40 mg Oral BID  . [MAR Hold] saccharomyces boulardii  250 mg Oral BID  . [MAR Hold] Warfarin - Pharmacist Dosing Inpatient   Does not apply q1800   Continuous Infusions: . sodium chloride 20 mL/hr at 08/07/16 1001     LOS: 14 days        Marius Betts, MD [facility] Pager [phone]  If 7PM-7AM, please contact night-coverage [email or web address] Password [facility] 08/07/2016, 12:18 PM

## 2016-08-08 LAB — BASIC METABOLIC PANEL
ANION GAP: 5 (ref 5–15)
BUN: 38 mg/dL — ABNORMAL HIGH (ref 6–20)
CALCIUM: 9.7 mg/dL (ref 8.9–10.3)
CO2: 33 mmol/L — AB (ref 22–32)
Chloride: 100 mmol/L — ABNORMAL LOW (ref 101–111)
Creatinine, Ser: 1.06 mg/dL (ref 0.61–1.24)
GLUCOSE: 172 mg/dL — AB (ref 65–99)
POTASSIUM: 4.3 mmol/L (ref 3.5–5.1)
Sodium: 138 mmol/L (ref 135–145)

## 2016-08-08 LAB — CBC
HCT: 26.7 % — ABNORMAL LOW (ref 39.0–52.0)
HEMATOCRIT: 28.5 % — AB (ref 39.0–52.0)
HEMOGLOBIN: 8 g/dL — AB (ref 13.0–17.0)
Hemoglobin: 8.2 g/dL — ABNORMAL LOW (ref 13.0–17.0)
MCH: 26.3 pg (ref 26.0–34.0)
MCH: 27.1 pg (ref 26.0–34.0)
MCHC: 28.8 g/dL — ABNORMAL LOW (ref 30.0–36.0)
MCHC: 30 g/dL (ref 30.0–36.0)
MCV: 91.3 fL (ref 78.0–100.0)
MCV: 90.5 fL (ref 78.0–100.0)
Platelets: 262 10*3/uL (ref 150–400)
Platelets: 260 10*3/uL (ref 150–400)
RBC: 3.12 MIL/uL — ABNORMAL LOW (ref 4.22–5.81)
RBC: 2.95 MIL/uL — AB (ref 4.22–5.81)
RDW: 15.8 % — AB (ref 11.5–15.5)
RDW: 15.9 % — ABNORMAL HIGH (ref 11.5–15.5)
WBC: 10.2 10*3/uL (ref 4.0–10.5)
WBC: 9.5 10*3/uL (ref 4.0–10.5)

## 2016-08-08 LAB — COMPREHENSIVE METABOLIC PANEL
ALT: 34 U/L (ref 17–63)
ANION GAP: 6 (ref 5–15)
AST: 53 U/L — ABNORMAL HIGH (ref 15–41)
Albumin: 1.9 g/dL — ABNORMAL LOW (ref 3.5–5.0)
Alkaline Phosphatase: 174 U/L — ABNORMAL HIGH (ref 38–126)
BILIRUBIN TOTAL: 0.8 mg/dL (ref 0.3–1.2)
BUN: 38 mg/dL — ABNORMAL HIGH (ref 6–20)
CALCIUM: 9.6 mg/dL (ref 8.9–10.3)
CO2: 33 mmol/L — AB (ref 22–32)
CREATININE: 1.09 mg/dL (ref 0.61–1.24)
Chloride: 96 mmol/L — ABNORMAL LOW (ref 101–111)
GFR calc non Af Amer: >60 mL/min (ref >60)
Glucose, Bld: 143 mg/dL — ABNORMAL HIGH (ref 65–99)
Potassium: 4.1 mmol/L (ref 3.5–5.1)
SODIUM: 135 mmol/L (ref 135–145)
TOTAL PROTEIN: 6.1 g/dL — AB (ref 6.5–8.1)

## 2016-08-08 LAB — TROPONIN I: Troponin I: 0.06 ng/mL (ref <0.03)

## 2016-08-08 LAB — PROTIME-INR
INR: 1.32
INR: 1.34
PROTHROMBIN TIME: 16.7 s — AB (ref 11.4–15.2)
Prothrombin Time: 16.5 seconds — ABNORMAL HIGH (ref 11.4–15.2)

## 2016-08-08 LAB — GLUCOSE, CAPILLARY
GLUCOSE-CAPILLARY: 154 mg/dL — AB (ref 65–99)
GLUCOSE-CAPILLARY: 161 mg/dL — AB (ref 65–99)
GLUCOSE-CAPILLARY: 140 mg/dL — AB (ref 65–99)
Glucose-Capillary: 187 mg/dL — ABNORMAL HIGH (ref 65–99)
Glucose-Capillary: 195 mg/dL — ABNORMAL HIGH (ref 65–99)

## 2016-08-08 LAB — HEMOGLOBIN AND HEMATOCRIT, BLOOD
HEMATOCRIT: 29.7 % — AB (ref 39.0–52.0)
Hemoglobin: 8.7 g/dL — ABNORMAL LOW (ref 13.0–17.0)

## 2016-08-08 MED ORDER — METOPROLOL TARTRATE 25 MG PO TABS: 25.0000 mg | ORAL_TABLET | Freq: Two times a day (BID) | ORAL | Status: AC

## 2016-08-08 MED ORDER — SACCHAROMYCES BOULARDII 250 MG PO CAPS: 250.0000 mg | ORAL_CAPSULE | Freq: Two times a day (BID) | ORAL | Status: DC

## 2016-08-08 MED ORDER — ACETAMINOPHEN 325 MG PO TABS: 650.0000 mg | ORAL_TABLET | Freq: Four times a day (QID) | ORAL | Status: DC | PRN

## 2016-08-08 MED ORDER — ALBUTEROL SULFATE (2.5 MG/3ML) 0.083% IN NEBU: 2.5000 mg | INHALATION_SOLUTION | RESPIRATORY_TRACT | 12 refills | Status: AC | PRN

## 2016-08-08 MED ORDER — DOCUSATE SODIUM 100 MG PO CAPS: 100.0000 mg | ORAL_CAPSULE | Freq: Two times a day (BID) | ORAL | 0 refills | Status: AC

## 2016-08-08 MED ORDER — NYSTATIN 100000 UNIT/GM EX POWD: Freq: Three times a day (TID) | CUTANEOUS | 0 refills | Status: AC

## 2016-08-08 MED ORDER — PANTOPRAZOLE SODIUM 40 MG PO TBEC: 40.0000 mg | DELAYED_RELEASE_TABLET | Freq: Two times a day (BID) | ORAL | Status: AC

## 2016-08-08 MED ORDER — INSULIN GLARGINE 100 UNIT/ML ~~LOC~~ SOLN: 5.0000 [IU] | Freq: Every day | SUBCUTANEOUS | 11 refills | Status: AC

## 2016-08-08 MED ORDER — FERROUS SULFATE 325 (65 FE) MG PO TABS: 325.0000 mg | ORAL_TABLET | Freq: Two times a day (BID) | ORAL | 3 refills | Status: AC

## 2016-08-08 MED ORDER — WARFARIN SODIUM 3 MG PO TABS
3.0000 mg | ORAL_TABLET | Freq: Every day | ORAL | Status: DC
Start: 2016-08-08 — End: 2016-09-12

## 2016-08-08 MED ORDER — IPRATROPIUM BROMIDE 0.02 % IN SOLN: 0.5000 mg | RESPIRATORY_TRACT | 12 refills | Status: AC | PRN

## 2016-08-08 NOTE — Progress Notes (Addendum)
[facility] Progress Note  Chief Complaint:   GI bleeding  Subjective: No complaints, has on 02 mask but waves that he feels okay,   Objective:  Vital signs in last 24 hours: Temp:  [97.6 F (36.4 C)-98.8 F (37.1 C)] 98.4 F (36.9 C) (02/16 0358) Pulse Rate:  [59-68] 61 (02/16 0900) Resp:  [15-27] 17 (02/16 0500) BP: (95-152)/(35-52) 110/44 (02/16 0900) SpO2:  [77 %-100 %] 98 % (02/16 0500) Weight:  [175 lb 0.7 oz (79.4 kg)] 175 lb 0.7 oz (79.4 kg) (02/16 0358) Last BM Date: 08/04/16 General:   Alert, chronically ill appearing white male in NAD EENT:  Normal hearing, non icteric sclera, conjunctive pink.  Heart:  Irregular , normal rate, +murmur Pulm: Normal respiratory effort, lungs CTA bilaterally without wheezes or crackles. Abdomen:  Soft, nondistended, nontender.  Normal bowel sounds, no masses felt. No hepatomegaly.    psych:  Alert and cooperative. Normal mood and affect.   Intake/Output from previous day: 02/15 0701 - 02/16 0700 In: 300 [I.V.:300] Out: 950 [Urine:950] Intake/Output this shift: No intake/output data recorded.  Lab Results:  Recent Labs  08/06/16 0547 08/07/16 0407 08/08/16 0308 08/08/16 0804  WBC 12.8* 14.7* 10.2  --   HGB 9.6* 9.0* 8.2* 8.7*  HCT 31.5* 30.1* 28.5* 29.7*  PLT 264 290 262  --    BMET  Recent Labs  08/06/16 0547 08/07/16 0407 08/08/16 0308  NA 137 138 138  K 4.9 4.5 4.3  CL 99* 100* 100*  CO2 31 33* 33*  GLUCOSE 147* 183* 172*  BUN 35* 39* 38*  CREATININE 0.88 0.99 1.06  CALCIUM 9.5 9.9 9.7   PT/INR  Recent Labs  08/07/16 0407 08/08/16 0308  LABPROT 17.7* 16.5*  INR 1.45 1.32    Assessment / Plan:  82 yo male with multiple medical problems on chronic coumadin presenting with acute drop in hgb and FOBT positive in setting of anticoagulation.  Hgb dropped from mid 11 range  Mid 7 range. EGD yesterday revealed two oozing superficial gastric ulcers, s/p clip placement for hemostasis. He  is s/p total of 4 units of blood.  Hgb is low but stable, actually up from 8.2 to 8.7 overnight .  -continue BID PPI -awaiting biopsies -He can restart coumadin  Principal Problem:   Right humeral fracture Active Problems:   DM (diabetes mellitus) type II uncontrolled, periph vascular disorder ([name])   S/P AVR (aortic valve replacement)   Chronic atrial fibrillation ([name])   Chronic diastolic heart failure ([name])   Pleural effusion   Anemia   Supratherapeutic INR   Chronic respiratory failure ([place])   Fall   Alzheimer's dementia   Fracture of humerus neck, right, closed, initial encounter   NSVT (nonsustained ventricular tachycardia) ([name])   PVC (premature ventricular contraction)   Acute gastric ulcer with hemorrhage    LOS: 15 days   Tye Savoy NP 08/08/2016, 9:18 AM  Pager number [phone]   Attending physician's note   I have taken an interval history, reviewed the chart and examined the patient. I agree with the Advanced Practitioner's note, impression and recommendations.  Superficial gastric ulcers likely secondary to pill gastritis, biopsies pending Ok to restart coumadin if indicated or there is need for continued anticoagulation Continue PPI twice daily Monitor Hgb and transfuse as needed to maintain Hgb >7 Overall patient has very poor prognosis due to multiple co morbodities and worsening dementia , consider palliative care consult Will sign off, please call with any  questions  Damaris Hippo, MD [phone] Mon-Fri 8a-5p [phone] after 5p, weekends, holidays

## 2016-08-08 NOTE — Progress Notes (Signed)
Patient resting comfortably with no respiratory distress noted. BIPAP is not needed at this time. RT will monitor as needed.

## 2016-08-08 NOTE — Evaluation (Signed)
Occupational Therapy Evaluation Patient Details Name: Sim Trundle. MRN: [name] DOB: [birth year] Today's Date: 08/08/2016    History of Present Illness 82 y.o.malewith PMHx DM, s/p aortic valve replacement, Chronic afib, CHF, HTN, Pleural effusion, Chronic respiratory failure, COPD with 2L O2 dependency, s/p pacemaker placement, and Alzheimer's dementia. Pt presenting after fall with R humeral fx. Pt with recent L great toe amputation with wound vac.    Clinical Impression   Unsure of PLOF as pt unable to recall how he was functioning at SNF. Currently pt requires mod assist +2 for basic transfers and max assist overall for ADL. VSS throughout on supplemental O2. Recommending return to SNF [facility]) upon d/c for continued rehab prior to return home. Pt would benefit from continued skilled OT to address established goals.    Follow Up Recommendations  SNF;Supervision/Assistance - 24 hour    Equipment Recommendations  Other (comment) (TBD)    Recommendations for Other Services       Precautions / Restrictions Precautions Precautions: Fall Required Braces or Orthoses: Sling;Other Brace/Splint Other Brace/Splint: darco shoe on L foot when up Restrictions Weight Bearing Restrictions: Yes RUE Weight Bearing: Non weight bearing (no orders but kept NWB) LLE Weight Bearing: Weight bearing as tolerated (with darco shoe) Other Position/Activity Restrictions: Per last hospital admission pt able to weight bear throughout L heel with darco shoe      Mobility Bed Mobility Overal bed mobility: Needs Assistance Bed Mobility: Rolling;Sidelying to Sit Rolling: Max assist;+2 for physical assistance Sidelying to sit: Max assist;+2 for physical assistance       General bed mobility comments: Assist for LEs to EOB and for trunk elevation. Cues throughout for technique and hand placement. Use of bed rail with HOB elevated  Transfers Overall transfer level: Needs  assistance Equipment used: 2 person hand held assist Transfers: Sit to/from [facility] Sit to Stand: Min assist;+2 physical assistance Stand pivot transfers: Mod assist;+2 physical assistance       General transfer comment: Cues throughout for sequencing and safety. Pt appears fearful of falling    Balance Overall balance assessment: Needs assistance Sitting-balance support: Feet supported;Single extremity supported Sitting balance-Leahy Scale: Fair     Standing balance support: Single extremity supported Standing balance-Leahy Scale: Poor                              ADL Overall ADL's : Needs assistance/impaired   Eating/Feeding Details (indicate cue type and reason): Pt reports he was able to self feed this AM with use of L hand             Upper Body Dressing : Maximal assistance;Bed level Upper Body Dressing Details (indicate cue type and reason): to don sling Lower Body Dressing: Total assistance;Bed level   Toilet Transfer: Moderate assistance;+2 for physical assistance;Stand-pivot Toilet Transfer Details (indicate cue type and reason): Simulated by stand pivot from EOB to chair         Functional mobility during ADLs: Moderate assistance;+2 for physical assistance (for stand pivot only) General ADL Comments: VSS throughout with use of supplemental O2 via [place]. Reapplied face mask at end of session.     Vision     Perception     Praxis      Pertinent Vitals/Pain Pain Assessment: Faces Faces Pain Scale: Hurts even more Pain Location: R shoulder with movement Pain Descriptors / Indicators: Grimacing;Guarding Pain Intervention(s): Monitored during session;Repositioned  Hand Dominance Right   Extremity/Trunk Assessment Upper Extremity Assessment Upper Extremity Assessment: RUE deficits/detail RUE: Unable to fully assess due to immobilization   Lower Extremity Assessment Lower Extremity Assessment: Defer to PT  evaluation       Communication Communication Communication: No difficulties   Cognition Arousal/Alertness: Lethargic (eyes closed throughout session but does respond to questions) Behavior During Therapy: Flat affect Overall Cognitive Status: No family/caregiver present to determine baseline cognitive functioning                     General Comments       Exercises       Shoulder Instructions      Home Living Family/patient expects to be discharged to:: Skilled nursing facility                                        Prior Functioning/Environment Level of Independence: Needs assistance        Comments: Pt unable to provide information but anticipate he required assist with mobility and ADL at SNF recently        OT Problem List: Decreased strength;Decreased range of motion;Decreased activity tolerance;Impaired balance (sitting and/or standing);Decreased cognition;Decreased safety awareness;Decreased knowledge of use of DME or AE;Decreased knowledge of precautions;Cardiopulmonary status limiting activity;Impaired UE functional use;Pain;Increased edema   OT Treatment/Interventions: Self-care/ADL training;Energy conservation;DME and/or AE instruction;Therapeutic activities;Patient/family education;Balance training    OT Goals(Current goals can be found in the care plan section) Acute Rehab OT Goals Patient Stated Goal: none stated OT Goal Formulation: With patient Time For Goal Achievement: 08/22/16 Potential to Achieve Goals: Good ADL Goals Pt Will Perform Upper Body Bathing: with min assist;sitting Pt Will Perform Upper Body Dressing: with min assist;sitting Pt Will Transfer to Toilet: with min assist;stand pivot transfer;bedside commode  OT Frequency: Min 2X/week   Barriers to D/C:            Co-evaluation PT/OT/SLP Co-Evaluation/Treatment: Yes Reason for Co-Treatment: Complexity of the patient's impairments (multi-system  involvement);For patient/therapist safety   OT goals addressed during session: ADL's and self-care      End of Session Equipment Utilized During Treatment: Gait belt;Oxygen;Other (comment) (sling, darco shoe) Nurse Communication: Mobility status  Activity Tolerance: Patient tolerated treatment well Patient left: in chair;with call bell/phone within reach;with chair alarm set   Time: 1001-1033 OT Time Calculation (min): 32 min Charges:  OT General Charges $OT Visit: 1 Procedure OT Evaluation $OT Eval Moderate Complexity: 1 Procedure G-Codes:     Binnie Kand M.S., OTR/L Pager: [phone]  08/08/2016, 10:55 AM

## 2016-08-08 NOTE — Discharge Instructions (Signed)
Follow with Primary MD [name], MD after discharge from SNF  Get CBC, CMP, 2 view Chest X ray checked  by Primary MD next visit.    Activity: As tolerated with Full fall precautions use walker/cane & assistance as needed   Disposition SNF   Diet: Heart Healthy  , with feeding assistance and aspiration precautions.  For Heart failure patients - Check your Weight same time everyday, if you gain over 2 pounds, or you develop in leg swelling, experience more shortness of breath or chest pain, call your Primary MD immediately. Follow Cardiac Low Salt Diet and 1.5 lit/day fluid restriction.   On your next visit with your primary care physician please Get Medicines reviewed and adjusted.   Please request your Prim.MD to go over all Hospital Tests and Procedure/Radiological results at the follow up, please get all Hospital records sent to your Prim MD by signing hospital release before you go home.   If you experience worsening of your admission symptoms, develop shortness of breath, life threatening emergency, suicidal or homicidal thoughts you must seek medical attention immediately by calling 911 or calling your MD immediately  if symptoms less severe.  You Must read complete instructions/literature along with all the possible adverse reactions/side effects for all the Medicines you take and that have been prescribed to you. Take any new Medicines after you have completely understood and accpet all the possible adverse reactions/side effects.   Do not drive, operating heavy machinery, perform activities at heights, swimming or participation in water activities or provide baby sitting services if your were admitted for syncope or siezures until you have seen by Primary MD or a Neurologist and advised to do so again.  Do not drive when taking Pain medications.    Do not take more than prescribed Pain, Sleep and Anxiety Medications  Special Instructions: If you have smoked or chewed Tobacco   in the last 2 yrs please stop smoking, stop any regular Alcohol  and or any Recreational drug use.  Wear Seat belts while driving.   Please note  You were cared for by a hospitalist during your hospital stay. If you have any questions about your discharge medications or the care you received while you were in the hospital after you are discharged, you can call the unit and asked to speak with the hospitalist on call if the hospitalist that took care of you is not available. Once you are discharged, your primary care physician will handle any further medical issues. Please note that NO REFILLS for any discharge medications will be authorized once you are discharged, as it is imperative that you return to your primary care physician (or establish a relationship with a primary care physician if you do not have one) for your aftercare needs so that they can reassess your need for medications and monitor your lab values.  ------------------------------------------------------------------------------------------------------------------------------------------------------------------------------ Information on my medicine - Coumadin   (Warfarin)  This medication education was reviewed with me or my healthcare representative as part of my discharge preparation.  Why was Coumadin prescribed for you? Coumadin was prescribed for you because you have a blood clot or a medical condition that can cause an increased risk of forming blood clots. Blood clots can cause serious health problems by blocking the flow of blood to the heart, lung, or brain. Coumadin can prevent harmful blood clots from forming. As a reminder your indication for Coumadin is:   Stroke Prevention Because Of Atrial Fibrillation  What test will check  on my response to Coumadin? While on Coumadin (warfarin) you will need to have an INR test regularly to ensure that your dose is keeping you in the desired range. The INR (international  normalized ratio) number is calculated from the result of the laboratory test called prothrombin time (PT).  If an INR APPOINTMENT HAS NOT ALREADY BEEN MADE FOR YOU please schedule an appointment to have this lab work done by your health care provider within 7 days. Your INR goal is usually a number between:  2 to 3 or your provider may give you a more narrow range like 2-2.5.  Ask your health care provider during an office visit what your goal INR is.  What  do you need to  know  About  COUMADIN? Take Coumadin (warfarin) exactly as prescribed by your healthcare provider about the same time each day.  DO NOT stop taking without talking to the doctor who prescribed the medication.  Stopping without other blood clot prevention medication to take the place of Coumadin may increase your risk of developing a new clot or stroke.  Get refills before you run out.  What do you do if you miss a dose? If you miss a dose, take it as soon as you remember on the same day then continue your regularly scheduled regimen the next day.  Do not take two doses of Coumadin at the same time.  Important Safety Information A possible side effect of Coumadin (Warfarin) is an increased risk of bleeding. You should call your healthcare provider right away if you experience any of the following: ? Bleeding from an injury or your nose that does not stop. ? Unusual colored urine (red or dark brown) or unusual colored stools (red or black). ? Unusual bruising for unknown reasons. ? A serious fall or if you hit your head (even if there is no bleeding).  Some foods or medicines interact with Coumadin (warfarin) and might alter your response to warfarin. To help avoid this: ? Eat a balanced diet, maintaining a consistent amount of Vitamin K. ? Notify your provider about major diet changes you plan to make. ? Avoid alcohol or limit your intake to 1 drink for women and 2 drinks for men per day. (1 drink is 5 oz. wine, 12 oz.  beer, or 1.5 oz. liquor.)  Make sure that ANY health care provider who prescribes medication for you knows that you are taking Coumadin (warfarin).  Also make sure the healthcare provider who is monitoring your Coumadin knows when you have started a new medication including herbals and non-prescription products.  Coumadin (Warfarin)  Major Drug Interactions  Increased Warfarin Effect Decreased Warfarin Effect  Alcohol (large quantities) Antibiotics (esp. Septra/Bactrim, Flagyl, Cipro) Amiodarone (Cordarone) Aspirin (ASA) Cimetidine (Tagamet) Megestrol (Megace) NSAIDs (ibuprofen, naproxen, etc.) Piroxicam (Feldene) Propafenone (Rythmol SR) Propranolol (Inderal) Isoniazid (INH) Posaconazole (Noxafil) Barbiturates (Phenobarbital) Carbamazepine (Tegretol) Chlordiazepoxide (Librium) Cholestyramine (Questran) Griseofulvin Oral Contraceptives Rifampin Sucralfate (Carafate) Vitamin K   Coumadin (Warfarin) Major Herbal Interactions  Increased Warfarin Effect Decreased Warfarin Effect  Garlic Ginseng Ginkgo biloba Coenzyme Q10 Green tea St. Johns wort    Coumadin (Warfarin) FOOD Interactions  Eat a consistent number of servings per week of foods HIGH in Vitamin K (1 serving =  cup)  Collards (cooked, or boiled & drained) Kale (cooked, or boiled & drained) Mustard greens (cooked, or boiled & drained) Parsley *serving size only =  cup Spinach (cooked, or boiled & drained) Swiss chard (cooked, or boiled & drained) Turnip  greens (cooked, or boiled & drained)  Eat a consistent number of servings per week of foods MEDIUM-HIGH in Vitamin K (1 serving = 1 cup)  Asparagus (cooked, or boiled & drained) Broccoli (cooked, boiled & drained, or raw & chopped) Brussel sprouts (cooked, or boiled & drained) *serving size only =  cup Lettuce, raw (green leaf, endive, romaine) Spinach, raw Turnip greens, raw & chopped   These websites have more information on Coumadin (warfarin):   FailFactory.se; VeganReport.com.au;

## 2016-08-08 NOTE — Clinical Social Work Note (Signed)
Per MD patient ready for DC to [facility]. RN, patient, patient's family (Wife), and facility notified of DC. RN given number for report. DC packet on chart. Ambulance transport requested for patient. CSW has confirmed with Rollene Fare at [facility] that the facility has a wound vac onsite for the patient. CSW signing off.   Liz Beach MSW, [place], [place], [id number]

## 2016-08-08 NOTE — Evaluation (Signed)
Physical Therapy Evaluation Patient Details Name: Devontre Cribari. MRN: [name] DOB: 1934/11/22 Today's Date: 08/08/2016   History of Present Illness  82 y.o.malewith PMHx DM, s/p aortic valve replacement, Chronic afib, CHF, HTN, Pleural effusion, Chronic respiratory failure, COPD with 2L O2 dependency, s/p pacemaker placement, and Alzheimer's dementia. Pt presenting after fall with R humeral fx. Pt with recent L great toe amputation with wound vac.   Clinical Impression  Pt admitted with above diagnosis. Pt currently with functional limitations due to the deficits listed below (see PT Problem List).  Pt will benefit from skilled PT to increase their independence and safety with mobility to allow discharge to the venue listed below.  Recommend returning to SNF for continued rehab.      Follow Up Recommendations SNF    Equipment Recommendations  None recommended by PT    Recommendations for Other Services       Precautions / Restrictions Precautions Precautions: Fall Required Braces or Orthoses: Sling;Other Brace/Splint Other Brace/Splint: darco shoe on L foot when up Restrictions Weight Bearing Restrictions: Yes RUE Weight Bearing: Non weight bearing (no orders but kept NWB) LLE Weight Bearing: Weight bearing as tolerated (with darco shoe) Other Position/Activity Restrictions: Per last hospital admission pt able to weight bear throughout L heel with darco shoe      Mobility  Bed Mobility Overal bed mobility: Needs Assistance Bed Mobility: Rolling;Sidelying to Sit Rolling: Max assist;+2 for physical assistance Sidelying to sit: Max assist;+2 for physical assistance       General bed mobility comments: Assist for LEs to EOB and for trunk elevation. Cues throughout for technique and hand placement. Use of bed rail with HOB elevated  Transfers Overall transfer level: Needs assistance Equipment used: 2 person hand held assist Transfers: Sit to/from [employer] Sit to Stand: Min assist;+2 physical assistance Stand pivot transfers: Mod assist;+2 physical assistance       General transfer comment: Cues throughout for sequencing and safety. Pt appears fearful of falling  Ambulation/Gait                Stairs            Wheelchair Mobility    Modified Rankin (Stroke Patients Only)       Balance Overall balance assessment: Needs assistance Sitting-balance support: Feet supported;Single extremity supported Sitting balance-Leahy Scale: Fair     Standing balance support: Single extremity supported Standing balance-Leahy Scale: Poor Standing balance comment: Fearful in standing with SPT                             Pertinent Vitals/Pain Pain Assessment: Faces Faces Pain Scale: Hurts even more Pain Location: R shoulder with movement Pain Descriptors / Indicators: Grimacing;Guarding Pain Intervention(s): Monitored during session;Repositioned    Home Living Family/patient expects to be discharged to:: Skilled nursing facility                      Prior Function Level of Independence: Needs assistance         Comments: Pt unable to provide information but anticipate he required assist with mobility and ADL at SNF recently     Hand Dominance   Dominant Hand: Right    Extremity/Trunk Assessment   Upper Extremity Assessment Upper Extremity Assessment: Defer to OT evaluation RUE: Unable to fully assess due to immobilization    Lower Extremity Assessment Lower Extremity Assessment: LLE deficits/detail LLE Deficits /  Details: wound vac on L foot from recent great toe amputation       Communication   Communication: No difficulties  Cognition Arousal/Alertness: Lethargic (eyes closed throughout session but does respond to questions) Behavior During Therapy: Flat affect Overall Cognitive Status: No family/caregiver present to determine baseline cognitive functioning                       General Comments General comments (skin integrity, edema, etc.): Pt with mask on upon entry due to being a mouth breather while sleeping.  Switched to nasal canula during session, but returned to mask at end in case he fell asleep again in recliner    Exercises     Assessment/Plan    PT Assessment Patient needs continued PT services  PT Problem List Decreased strength;Decreased activity tolerance;Decreased balance;Decreased mobility;Decreased knowledge of use of DME          PT Treatment Interventions DME instruction;Gait training;Functional mobility training;Therapeutic activities;Therapeutic exercise;Balance training    PT Goals (Current goals can be found in the Care Plan section)  Acute Rehab PT Goals Patient Stated Goal: none stated PT Goal Formulation: With patient Time For Goal Achievement: 08/22/16 Potential to Achieve Goals: Good    Frequency Min 2X/week   Barriers to discharge        Co-evaluation   Reason for Co-Treatment: Complexity of the patient's impairments (multi-system involvement) PT goals addressed during session: Mobility/safety with mobility;Balance OT goals addressed during session: ADL's and self-care       End of Session Equipment Utilized During Treatment: Gait belt;Oxygen Activity Tolerance: Patient tolerated treatment well Patient left: in chair;with call bell/phone within reach;with chair alarm set Nurse Communication: Mobility status         Time: [id number] PT Time Calculation (min) (ACUTE ONLY): 30 min   Charges:   PT Evaluation $PT Eval Moderate Complexity: 1 Procedure     PT G Codes:        Angeles Paolucci LUBECK 08/08/2016, 12:00 PM

## 2016-08-08 NOTE — Progress Notes (Signed)
Physical Therapy Treatment Patient Details Name: [name]. MRN: [name] DOB: 06-14-1935 Today's Date: 08/08/2016    History of Present Illness 82 y.o.malewith PMHx DM, s/p aortic valve replacement, Chronic afib, CHF, HTN, Pleural effusion, Chronic respiratory failure, COPD with 2L O2 dependency, s/p pacemaker placement, and Alzheimer's dementia. Pt presenting after fall with R humeral fx. Pt with recent L great toe amputation with wound vac.     PT Comments    Pt had worked his way to edge of recliner.  PT came in to A nurse tech and nurse.  Educated them on safe transfer technique.  Pt needed more A than this morning with SPT due to fatigue.  Con't to recommend SNF.  Follow Up Recommendations  SNF;Supervision/Assistance - 24 hour     Equipment Recommendations  None recommended by PT    Recommendations for Other Services       Precautions / Restrictions Precautions Precautions: Fall Precaution Comments: wound vac Required Braces or Orthoses: Sling;Other Brace/Splint Other Brace/Splint: darco shoe on L foot when up Restrictions Weight Bearing Restrictions: Yes RUE Weight Bearing: Non weight bearing (no orders but kept NWB) LLE Weight Bearing: Weight bearing as tolerated (with Darco shoe) Other Position/Activity Restrictions: Per last hospital admission pt able to weight bear throughout L heel with darco shoe    Mobility  Bed Mobility Overal bed mobility: Needs Assistance Bed Mobility: Rolling;Sidelying to Sit Rolling: Max assist;+2 for physical assistance Sidelying to sit: Max assist;+2 for physical assistance       General bed mobility comments: Assist for LEs to EOB and for trunk elevation. Cues throughout for technique and hand placement. Use of bed rail with HOB elevated  Transfers Overall transfer level: Needs assistance Equipment used: 2 person hand held assist Transfers: Sit to/from [employer] Sit to Stand: Mod assist;+2 physical  assistance Stand pivot transfers: Max assist;+2 physical assistance       General transfer comment: Noted buckeling in L LE with transfer back to bed. Re-adjusted sling prior to transfer and educated NT and nurse on safe transfer technique  Ambulation/Gait                 Stairs            Wheelchair Mobility    Modified Rankin (Stroke Patients Only)       Balance Overall balance assessment: Needs assistance Sitting-balance support: Feet supported;Single extremity supported Sitting balance-Leahy Scale: Fair     Standing balance support: Single extremity supported Standing balance-Leahy Scale: Poor Standing balance comment: Fearful in standing with SPT                    Cognition Arousal/Alertness: Awake/alert Behavior During Therapy: Flat affect Overall Cognitive Status: No family/caregiver present to determine baseline cognitive functioning                      Exercises      General Comments General comments (skin integrity, edema, etc.): Pt with mask on upon entry due to being a mouth breather while sleeping.  Switched to nasal canula during session, but returned to mask at end in case he fell asleep again in recliner      Pertinent Vitals/Pain Pain Assessment: Faces Faces Pain Scale: Hurts little more Pain Location: R shoulder with movement Pain Descriptors / Indicators: Grimacing;Guarding Pain Intervention(s): Limited activity within patient's tolerance;Monitored during session;Repositioned    Home Living Family/patient expects to be discharged to:: Skilled nursing facility  Prior Function Level of Independence: Needs assistance      Comments: Pt unable to provide information but anticipate he required assist with mobility and ADL at SNF recently   PT Goals (current goals can now be found in the care plan section) Acute Rehab PT Goals Patient Stated Goal: none stated PT Goal Formulation: With  patient Time For Goal Achievement: 08/22/16 Potential to Achieve Goals: Good Progress towards PT goals: Progressing toward goals    Frequency    Min 2X/week      PT Plan Current plan remains appropriate    Co-evaluation   Reason for Co-Treatment: Complexity of the patient's impairments (multi-system involvement) PT goals addressed during session: Mobility/safety with mobility;Balance OT goals addressed during session: ADL's and self-care     End of Session Equipment Utilized During Treatment: Gait belt;Oxygen Activity Tolerance: Patient tolerated treatment well Patient left: in bed;with nursing/sitter in room     Time: 1254-1304 PT Time Calculation (min) (ACUTE ONLY): 10 min  Charges:  $Therapeutic Activity: 8-22 mins                    G Codes:      [name] 08/08/2016, 1:56 PM

## 2016-08-08 NOTE — Progress Notes (Signed)
[facility] to pick patient up patient. Vital signs 90/59 Hr 59 93 % on 4Lnc. [place] stated that [facility] would refuse patient with those vital signs. Dr. Maudie Mercury notified and orders keep patient over night and try again when more stable VS. Patient placed back on the monitor. PIV's were removed before decision made to hold transfer.

## 2016-08-08 NOTE — Care Management Important Message (Signed)
Important Message  Patient Details  Name: [name]. MRN: [id number] Date of Birth: 03/13/1935   Medicare Important Message Given:  Yes    [name] Abena 08/08/2016, 1:00 PM

## 2016-08-08 NOTE — Progress Notes (Signed)
Pt with new discoloration on the lateral aspect of the left foot.  Skin is in tact.  Continue with no pressure to the foot.  Wound vac in tact with good seal.  Continue current care.  F/u with Dr. Trula Slade in 2-3 weeks.  Our office will arrange.   Leontine Locket, [facility] 08/08/2016 11:48 AM   New area of discoloration on lateral side of left foot without skin breakdown.  I suspect this is from pressure.  Would minimize pressure with air boots ( already in place.)  [employer]

## 2016-08-08 NOTE — Discharge Summary (Signed)
Seth Finner., is a 82 y.o. male  DOB January 18, 1935  MRN [name].  Admission date:  07/24/2016  Admitting Physician  Norval Morton, MD  Discharge Date:  08/08/2016   Primary MD  [name], MD  Recommendations for primary care physician for things to follow:  - Patient resumed on warfarin, monitor INR and adjust warfarin dose as needed.  - Check CBC, BMP in 3 days - Patient to be seen by SNF physician in 48 hours. - Patient on oxygen 3-4 liters nasal cannula, try to wean back to baseline at 2 L nasal cannula - Please repeat chest x-ray in 1 week to reevaluate for recurrence of right pleural effusion - patient to follow with vascular surgery as an outpatient appointment scheduled for 2/23 - will need follow-up with orthopedic Dr. Meridee Score in 1 week regarding right humerus fracture - Would recommend palliative care consult at facility   Admission Diagnosis  evaluation right humorous  Nonhealing left great toe amputation site T81.89XA FOBT positive. Anemia.   Discharge Diagnosis  evaluation right humorous  Nonhealing left great toe amputation site T81.89XA FOBT positive. Anemia.    Principal Problem:   Right humeral fracture Active Problems:   DM (diabetes mellitus) type II uncontrolled, periph vascular disorder ([name])   S/P AVR (aortic valve replacement)   Chronic atrial fibrillation ([name])   Chronic diastolic heart failure ([name])   Pleural effusion   Anemia   Supratherapeutic INR   Chronic respiratory failure ([place])   Fall   Alzheimer's dementia   Fracture of humerus neck, right, closed, initial encounter   NSVT (nonsustained ventricular tachycardia) ([name])   PVC (premature ventricular contraction)   Acute gastric ulcer with hemorrhage      Past Medical History:  Diagnosis Date  . Aortal stenosis 2006   biophrostetic  . Arthritis   . Atrial fibrillation ([place])    onset after  CABG--- coumadin management at Coumadin clinic  . Carotid artery disease ([place]) 09/27/2010   Carotid US 12/17: bilat ICA 40-59 >> 1 year FU  . CHF (congestive heart failure) ([place])    abter CABG  . Complete heart block ([place])   . Coronary artery disease   . DM type 2 (diabetes mellitus, type 2) ([place])    used to see Dr Meredith Pel, now f/u by Dr Larose Kells  . Erectile dysfunction   . Gangrene ([place])   . History of nephrolithiasis   . HTN (hypertension)   . Hyperlipidemia   . Memory loss   . Obesity   . OSA (obstructive sleep apnea)    not using CPAP  . Pacemaker 2/07   VVI  . PVD (peripheral vascular disease) ([name])    CT angio showed- vascular insuff, intestine and RAS bilaterally, andgiogram 02/2009 severe PVD medical management  . S/P aortic valve replacement   . Sick sinus syndrome ([name])    [name]  . Skin cancer    surgery (nose) 09-2010    Past Surgical History:  Procedure Laterality Date  .  ABDOMINAL AORTAGRAM N/A 11/10/2012   Procedure: ABDOMINAL Maxcine Ham;  Surgeon: Serafina Mitchell, MD;  Location: [facility] CATH LAB;  Service: Cardiovascular;  Laterality: N/A;  . AMPUTATION  01/2010   great toe  . AMPUTATION Left 07/16/2016   Procedure: RECECTION OF LEFT GREAT TOE METATARSAL HEAD and sesamoid;  Surgeon: Waynetta Sandy, MD;  Location: [place];  Service: Vascular;  Laterality: Left;  . AMPUTATION Left 07/31/2016   Procedure: SECOND TOE AMPUTATION;  Surgeon: Serafina Mitchell, MD;  Location: [place] OR;  Service: Vascular;  Laterality: Left;  . ANGIOPLASTY / STENTING FEMORAL  02/10/10   Left femoral  . ANGIOPLASTY ILLIAC ARTERY Left 07/16/2016   Procedure: drug coated Balloon ANGIOPLASTY of left superficial femoral artry stent;  Surgeon: Waynetta Sandy, MD;  Location: [place];  Service: Vascular;  Laterality: Left;  . AORTIC VALVE REPLACEMENT     biophrostetic for Ao Stenosis 2006  . AORTIC VALVE REPLACEMENT  01/17/2011   S/P redo median sternotomy, extracorporeal  circulation, redo Aortic Valve Replacement using a 23-mm Edwards pericardial Magna-Ease valve, Dr Ulla Gallo  . AORTOGRAM N/A 07/16/2016   Procedure: left lower extremity angiogram;  Surgeon: Waynetta Sandy, MD;  Location: [place];  Service: Vascular;  Laterality: N/A;  . APPLICATION OF WOUND VAC Left 07/31/2016   Procedure: APPLICATION OF WOUND VAC;  Surgeon: Serafina Mitchell, MD;  Location: [place];  Service: Vascular;  Laterality: Left;  . CATARACT EXTRACTION, BILATERAL    . CORONARY ARTERY BYPASS GRAFT  2006  . EP IMPLANTABLE DEVICE N/A 06/06/2016   Procedure: PPM Generator Changeout;  Surgeon: Thompson Grayer, MD;  Location: [place] CV LAB;  Service: Cardiovascular;  Laterality: N/A;  . EYE SURGERY    . ILIAC ATHERECTOMY Left 07/16/2016   Procedure: Angioplasty and ATHERECTOMY of left anterior tibial artery;  Surgeon: Waynetta Sandy, MD;  Location: [place];  Service: Vascular;  Laterality: Left;  . INCISE AND DRAIN ABCESS Right May 2014   right heel   . LOWER EXTREMITY ANGIOGRAM Left 11/23/2012   Procedure: LOWER EXTREMITY ANGIOGRAM;  Surgeon: Serafina Mitchell, MD;  Location: [facility] CATH LAB;  Service: Cardiovascular;  Laterality: Left;  . PACEMAKER PLACEMENT  07/2005   Medtronic Sigma SR implanted by Dr Olevia Perches  . PERIPHERAL VASCULAR CATHETERIZATION N/A 07/10/2016   Procedure: Abdominal Aortogram;  Surgeon: Conrad [place], MD;  Location: [place] CV LAB;  Service: Cardiovascular;  Laterality: N/A;  . PERIPHERAL VASCULAR CATHETERIZATION Bilateral 07/10/2016   Procedure: Lower Extremity Angiography;  Surgeon: Conrad [place], MD;  Location: [place] CV LAB;  Service: Cardiovascular;  Laterality: Bilateral;  . TONSILLECTOMY    . ULTRASOUND GUIDANCE FOR VASCULAR ACCESS Bilateral 07/16/2016   Procedure: ULTRASOUND GUIDANCE FOR VASCULAR ACCESS of right common femoral and left dorsalis pedis;  Surgeon: Waynetta Sandy, MD;  Location: [facility] OR;  Service: Vascular;  Laterality: Bilateral;   . WOUND DEBRIDEMENT Left 07/31/2016   Procedure: DEBRIDEMENT GREAT TOE AMPUTATION SITE;  Surgeon: Serafina Mitchell, MD;  Location: [place];  Service: Vascular;  Laterality: Left;       History of present illness and  Hospital Course:     Kindly see H&P for history of present illness and admission details, please review complete Labs, Consult reports and Test reports for all details in brief  HPI  from the history and physical done on the day of admission 07/24/2016  HPI: Seth Sewer. is a 81 y.o. male with medical history significant  ofsevere AS s/p redo aVR (bioprosthetic valve), diastolic CHF, CAD s/p PM & CABG, A.fib on Coumadin, hx left atrial thrombus by TEE in 7/12, PVD, COPD oxygen dependent on 2L [place], DM 2, left foot osteomyelitis s/p amputation, Alz dementia; who presents after a fall. History is obtained from the patient's wife and review of records as patient has significant dementia. Patient was just recently hospitalized 1/11-1/29 to the hospitalists service for acute respiratory failure with hypoxia. Patient seen to have a large right-sided pleural effusion for which he underwent thoracentesis which showed a transit date of fluid. He also received IV diuretics and was transitioned to oral Lasix. diabetic foot ulcer with osteomyelitis treated with antibiotics while hospitalized. Patient had only been at [facility] for 3 days, but wife notes that he was more lethargic than usual and his short-term memory was significantly worse. Patient reportedly fell sometime this morning around 12:30 and was found on the floor. Patient wife notes since he's had a pacemaker placed he's progressively declined.   ED Course: Upon admission to the emergency department patient was seen to be afebrile, pulse 59-76, respirations 16- 24, blood pressure as low as 93/48, O2 saturation 90-100% on home 2 L of [place]. Lab work revealed WBC 12.6, hemoglobin 7.3(baseline  13 in 05/2016), creatinine 1.26, BUN 44,  glucose 205, BNP 167, INR 3.6, troponin 0.08. X-ray images showed no acute intracranial abnormalities ,but did reveal a right humeral neck fracture. Patient was given 1 g of oral vitamin K for elevated INR, typed and screen for blood products and ordered. Transfuse 1 unit of packed red blood cells.  Hospital Course   82 year old male with PMH of severe AS s/p redo AVR (bioprosthetic valve), chronic diastolic CHF, CAD status post CABG, complete heart block s/p PPM, A. fib on chronic Coumadin, history of left atrial thrombus by TEE 7/12, PAD, COPD on home oxygen 2 L/m, type II DM, HTN, HLD, OSA not on CPAP, left foot osteomyelitis status post amputation of great toe, Alzheimer's dementia, recent hospitalization 1/11-1/29 for acute respiratory failure with hypoxia, noted to have a large right-sided pleural effusion for which she underwent thoracentesis, diabetic foot ulcer with osteomyelitis treated with antibiotics and then discharged to [facility] for 3 days, presented back to the ED following a fall on day of admission, found on floor. Also noted more lethargic than usual and worsening short-term memory. X-rays confirmed right humeral neck fracture. Hemoglobin 7.3. Vascular surgery consulted for left foot wound management-underwent I&D of left great toe on 2/6 and 2/8. Cardiology consulted for NSVT. Orthopedics consulted for right humerus fracture and recommended nonoperative management.  Right humeral neck fracture:  - Sustained status post fall at SNF. X-ray of right shoulder 07/24/16 confirmed displaced mildly comminuted humeral neck fracture and no evidence of intra-articular extension. Continue right shoulder sling. Orthopedics consultation appreciated: Confirmed right proximal humerus fracture of mainly the surgical neck which can be treated with just a sling. Outpatient follow-up with Dr. Beverely Low duda in 1 week.  Ortho Recommendation(Recommend with the patient and family that he would be most  comfortable with his head elevated or in a reclining chair. Will hold on mobilization of the shoulder for 2 weeks I will review reevaluate in 2 weeks with repeat radiographs and anticipate starting range of motion at that time)  Acute on chronic anemia:  secondary to upper GI bleed from gastric ulcer - Hemoglobin 12 g in December 2017 but has progressively dropped and was 7.3 on admission.  far required  4 units PRBC transfusion this hospital stay, he was Hemoccult positive, GI consulted, status post EGD, significant for 2 or oozing superficial gastric ulcer status post clip placement for hemostasis, cleared by GI to resume on warfarin, will resume at 3 mg oral at bedtime, to continue with Protonix 40 mg twice a day  Chronic A. fib:  Heart rate controlled,CHA2DS2VASc score is 6. Metoprolol started by cardiology for NSVT which should help A. fib too, back on warfarin.  Right-sided pleural effusion, recurrent:  - Status post 2.1 L thoracentesis on 2/5, and repeat thoracentesis on 2/15 with 2.1 L drained, transudate differential, continue to monitor closely and repeat EKG in 1 week for follow-up  Chronic hypoxic respiratory failure:  - On home oxygen 2 L/m. Some concern for dysphagia on 2/8. Speech therapy evaluated 2/9 and recommend regular consistency diet and thin liquids.  Stage III chronic kidney disease:  - Stable.  Mild hyperkalemia:  - Resolved.  Chronic diastolic CHF:  - Clinically euvolemic. Continue oral Lasix.   CAD status post CABG: -  Asymptomatic of chest pain.  Complete heart block status post PPM:  - Monitor on telemetry which also showed episodes of NSVT.   Status post bioprosthetic AVR  Type II DM: - Reasonable inpatient control on SSI.  Essential hypertension: Soft blood pressure, started on metoprolol for nonsustained VT, stopped lisinopril  S/P angioplasty and stenting LLE and left great toe amputation:  - As per vascular surgery follow-up,  underwent excisional debridement of left great toe amputation site on 2/6 night and repeat I&D in all OR on 2/8 followed by negative pressure dressing. Vascular surgery has seen and cleared for discharge. Outpatient follow-up with vascular surgery.  Nonsustained VT:  - Monitored on telemetry. Cardiology consultation and follow-up appreciated. Started on metoprolol. 2-D echo 07/31/16: LVEF 55-60 percent, normal wall motion without regional wall motion abnormalities and severe pulmonary hypertension. Metoprolol started and Increased metoprolol to 25 MG twice a day. NSVT burden has decreased. Potassium 4.4. Magnesium 1.8. Replace to keep >2.  Dementia:  - Moderate at least. Continue Aricept.  Adult failure to thrive: - Apparently has been declining for some time now.Consider palliative care consultation for goals of care >can be done at SNF.Marland Kitchen   Discharge Condition:  Stable at time of discharge   Follow UP   Contact information for follow-up providers    Newt Minion, MD Follow up in 2 week(s).   Specialty:  Orthopedic Surgery Contact information: [place] [address] [phone]        Annamarie Major, MD Follow up on 08/15/2016.   Specialties:  Vascular Surgery, Cardiology Why:  Appt with NP for wound check @ 9:15 am Contact information: [address] [place] [address] [phone]            Contact information for after-discharge care    Destination    [facility] Follow up.   Specialty:  [place] information: [place] [phone]                    Discharge Instructions  and  Discharge Medications   Discharge Instructions    Discharge instructions    Complete by:  As directed    Follow with Primary MD [name], MD after discharge from SNF  Get CBC, CMP, 2 view Chest X ray checked  by Primary MD next visit.    Activity: As tolerated with Full  fall  precautions use walker/cane & assistance as needed   Disposition SNF   Diet: Heart Healthy  , with feeding assistance and aspiration precautions.  For Heart failure patients - Check your Weight same time everyday, if you gain over 2 pounds, or you develop in leg swelling, experience more shortness of breath or chest pain, call your Primary MD immediately. Follow Cardiac Low Salt Diet and 1.5 lit/day fluid restriction.   On your next visit with your primary care physician please Get Medicines reviewed and adjusted.   Please request your Prim.MD to go over all Hospital Tests and Procedure/Radiological results at the follow up, please get all Hospital records sent to your Prim MD by signing hospital release before you go home.   If you experience worsening of your admission symptoms, develop shortness of breath, life threatening emergency, suicidal or homicidal thoughts you must seek medical attention immediately by calling 911 or calling your MD immediately  if symptoms less severe.  You Must read complete instructions/literature along with all the possible adverse reactions/side effects for all the Medicines you take and that have been prescribed to you. Take any new Medicines after you have completely understood and accpet all the possible adverse reactions/side effects.   Do not drive, operating heavy machinery, perform activities at heights, swimming or participation in water activities or provide baby sitting services if your were admitted for syncope or siezures until you have seen by Primary MD or a Neurologist and advised to do so again.  Do not drive when taking Pain medications.    Do not take more than prescribed Pain, Sleep and Anxiety Medications  Special Instructions: If you have smoked or chewed Tobacco  in the last 2 yrs please stop smoking, stop any regular Alcohol  and or any Recreational drug use.  Wear Seat belts while driving.   Please note  You were cared  for by a hospitalist during your hospital stay. If you have any questions about your discharge medications or the care you received while you were in the hospital after you are discharged, you can call the unit and asked to speak with the hospitalist on call if the hospitalist that took care of you is not available. Once you are discharged, your primary care physician will handle any further medical issues. Please note that NO REFILLS for any discharge medications will be authorized once you are discharged, as it is imperative that you return to your primary care physician (or establish a relationship with a primary care physician if you do not have one) for your aftercare needs so that they can reassess your need for medications and monitor your lab values.   Increase activity slowly    Complete by:  As directed      Allergies as of 08/08/2016      Reactions   Potassium-containing Compounds Anaphylaxis   IV--loss of conciousness   Metoprolol Succinate Other (See Comments)   Extremely tired      Medication List    STOP taking these medications   lisinopril 10 MG tablet Commonly known as:  PRINIVIL,ZESTRIL   OVER THE COUNTER MEDICATION     TAKE these medications   acetaminophen 325 MG tablet Commonly known as:  TYLENOL Take 2 tablets (650 mg total) by mouth every 6 (six) hours as needed for mild pain (or Fever >/= 101). What changed:  when to take this  reasons to take this   albuterol (2.5 MG/3ML) 0.083% nebulizer solution Commonly known as:  PROVENTIL Take  3 mLs (2.5 mg total) by nebulization every 4 (four) hours as needed for wheezing or shortness of breath.   aspirin 81 MG EC tablet Take 1 tablet (81 mg total) by mouth daily.   DECUBI-VITE Caps Take 1 capsule by mouth daily.   docusate sodium 100 MG capsule Commonly known as:  COLACE Take 1 capsule (100 mg total) by mouth 2 (two) times daily.   donepezil 10 MG tablet Commonly known as:  ARICEPT Take 1 tablet (10 mg  total) by mouth at bedtime.   ezetimibe-simvastatin 10-40 MG tablet Commonly known as:  VYTORIN Take 1 tablet by mouth at bedtime.   ferrous sulfate 325 (65 FE) MG tablet Take 1 tablet (325 mg total) by mouth 2 (two) times daily with a meal.   furosemide 40 MG tablet Commonly known as:  LASIX Take 1 tablet (40 mg total) by mouth 2 (two) times daily.   insulin aspart 100 UNIT/ML injection Commonly known as:  novoLOG Inject 0-9 Units into the skin 3 (three) times daily with meals. Sliding scale insulin Less than 70 initiate hypoglycemia protocol 70-120  0 units 120-150 1 unit 151-200 2 units 201-250 3 units 251-300 5 units 301-350 7 units 351-400 9 units  Greater than 400 call MD   insulin glargine 100 UNIT/ML injection Commonly known as:  LANTUS Inject 0.05 mLs (5 Units total) into the skin at bedtime. What changed:  how much to take   ipratropium 0.02 % nebulizer solution Commonly known as:  ATROVENT Take 2.5 mLs (0.5 mg total) by nebulization every 4 (four) hours as needed for wheezing or shortness of breath.   metoprolol tartrate 25 MG tablet Commonly known as:  LOPRESSOR Take 1 tablet (25 mg total) by mouth 2 (two) times daily.   nystatin powder Commonly known as:  MYCOSTATIN/NYSTOP Apply topically 3 (three) times daily. Apply to groin daily   pantoprazole 40 MG tablet Commonly known as:  PROTONIX Take 1 tablet (40 mg total) by mouth 2 (two) times daily.   saccharomyces boulardii 250 MG capsule Commonly known as:  FLORASTOR Take 1 capsule (250 mg total) by mouth 2 (two) times daily.   warfarin 3 MG tablet Commonly known as:  COUMADIN Take 1 tablet (3 mg total) by mouth daily at 6 PM. What changed:  how much to take         Diet and Activity recommendation: See Discharge Instructions above   Consults obtained -  Vascular surgery  Interventional radiology  Cardiology   Orthopedics  Gastroenterology  Major procedures and Radiology Reports - PLEASE  review detailed and final reports for all details, in brief -    Therapeutic right thoracentesis on 07/28/16, and 08/07/2016  Left great toe amputation site debridement by vascular surgery on 07/29/16  I&D left great toe 07/31/16  EGD 2/15 bilateral lower GI, significant for oozing gastric ulcer status post clip placement for home he status is   Dg Chest 1 View  Result Date: 08/07/2016 CLINICAL DATA:  Post right thoracentesis EXAM: CHEST 1 VIEW COMPARISON:  08/06/2016 FINDINGS: Significant decrease in right pleural effusion following thoracentesis. Small remaining right pleural effusion. No pneumothorax. Bilateral airspace disease and vascular congestion most compatible with pulmonary edema which has progressed in the interval. Single lead transvenous pacemaker noted. Prior CABG and aortic valve replacement IMPRESSION: Decreased right pleural effusion following thoracentesis. No pneumothorax Progression of airspace disease consistent with edema. Electronically Signed   By: Franchot Gallo M.D.   On: 08/07/2016 16:22  Dg Chest 1 View  Result Date: 07/28/2016 CLINICAL DATA:  82 year old male status post right chest ultrasound-guided thoracentesis today. Initial encounter. EXAM: CHEST 1 VIEW COMPARISON:  07/27/2016 and earlier. FINDINGS: AP semi upright view at 1513 hours. Decreased right pleural effusion with no pneumothorax. Small volume residual including some pleural fluid in the minor fissure. Improved right mid and lower lung ventilation. Stable cardiomegaly and mediastinal contours. Stable left lung. IMPRESSION: Decreased right pleural effusion and increased right lung ventilation with no pneumothorax. Electronically Signed   By: Genevie Ann M.D.   On: 07/28/2016 15:32   Dg Chest 1 View  Result Date: 07/24/2016 CLINICAL DATA:  [facility] day nursing facility. EXAM: CHEST 1 VIEW COMPARISON:  07/14/2016 FINDINGS: Mildly comminuted and displaced proximal right humeral fracture. Moderate vascular and  interstitial prominence, worsened. Central and basilar airspace opacities likely represent alveolar edema. Moderate right pleural effusion, unchanged or slightly enlarged. Unchanged cardiomegaly. The transvenous cardiac lead appears grossly intact. Prior sternotomy with CABG and aortic valvuloplasty. IMPRESSION: Interstitial and vascular changes, as well as central airspace opacities, consistent with congestive heart failure. Moderate right pleural effusion. Electronically Signed   By: Andreas Newport M.D.   On: 07/24/2016 21:06   Dg Chest 2 View  Result Date: 07/14/2016 CLINICAL DATA:  Shortness of breath. EXAM: CHEST  2 VIEW COMPARISON:  07/12/2016 FINDINGS: The heart is mildly enlarged but stable. Stable right ventricular pacer wire. The lungs demonstrate much improved aeration with resolving pulmonary edema. There is a persistent right-sided pleural effusion and overlying atelectasis. IMPRESSION: Resolving pulmonary edema. Persistent small right pleural effusion and overlying atelectasis. Electronically Signed   By: Marijo Sanes M.D.   On: 07/14/2016 12:06   Dg Shoulder Right  Result Date: 07/24/2016 CLINICAL DATA:  Fall with right arm pain. Humerus fracture on outside radiograph. EXAM: RIGHT SHOULDER - 2+ VIEW COMPARISON:  None. FINDINGS: Oblique fracture of the humeral neck is mildly comminuted. There is 18 mm lateral and mild anterior displacement of the humeral diaphysis. No evidence of extension to the glenohumeral joint. Chronic deformity of the right acromioclavicular joint fragmentation. Vascular calcifications are seen. IMPRESSION: Displaced mildly comminuted humeral neck fracture. No evidence of intra-articular extension. Electronically Signed   By: Jeb Levering M.D.   On: 07/24/2016 21:07   Ct Head Wo Contrast  Result Date: 07/24/2016 CLINICAL DATA:  Status post fall. EXAM: CT HEAD WITHOUT CONTRAST CT CERVICAL SPINE WITHOUT CONTRAST TECHNIQUE: Multidetector CT imaging of the head and  cervical spine was performed following the standard protocol without intravenous contrast. Multiplanar CT image reconstructions of the cervical spine were also generated. COMPARISON:  Head CT dated 01/08/2011. FINDINGS: CT HEAD FINDINGS Brain: There is generalized age related parenchymal atrophy with commensurate dilatation of the ventricles and sulci. Mild chronic small vessel ischemic changes noted within the deep periventricular white matter regions bilaterally. There is no mass, hemorrhage, edema or other evidence of acute parenchymal abnormality. No extra-axial hemorrhage. Vascular: There are chronic calcified atherosclerotic changes of the large vessels at the skull base. No unexpected hyperdense vessel. Skull: Normal. Negative for fracture or focal lesion. Sinuses/Orbits: No acute finding. Other: None. CT CERVICAL SPINE FINDINGS Alignment: Slightly accentuated kyphosis of the lower cervical spine related to underlying degenerative change. No evidence of acute subluxation. Skull base and vertebrae: No fracture line or displaced fracture fragment identified. Soft tissues and spinal canal: No prevertebral fluid or swelling. No visible canal hematoma. Disc levels: Degenerative change within the mid and lower cervical spine, mild to  moderate in degree, with associated mild disc space narrowings and osseous spurring. No more than mild central canal stenosis at any level. Upper chest: Large right pleural effusion extending to the right lung apex, with probable adjacent atelectasis. Other: Heavy atherosclerotic calcifications at each carotid bulb region and at each carotid origin. IMPRESSION: 1. No acute intracranial abnormality. No intracranial mass, hemorrhage or edema. No skull fracture. Atrophy and chronic ischemic changes in the white matter. 2. No fracture or acute subluxation within the cervical spine. Degenerative/chronic findings of the cervical spine, as detailed above. 3. Large right pleural effusion,  extending to the right lung apex, with probable adjacent atelectasis. 4. Carotid atherosclerosis. Electronically Signed   By: Franki Cabot M.D.   On: 07/24/2016 21:42   Ct Cervical Spine Wo Contrast  Result Date: 07/24/2016 CLINICAL DATA:  Status post fall. EXAM: CT HEAD WITHOUT CONTRAST CT CERVICAL SPINE WITHOUT CONTRAST TECHNIQUE: Multidetector CT imaging of the head and cervical spine was performed following the standard protocol without intravenous contrast. Multiplanar CT image reconstructions of the cervical spine were also generated. COMPARISON:  Head CT dated 01/08/2011. FINDINGS: CT HEAD FINDINGS Brain: There is generalized age related parenchymal atrophy with commensurate dilatation of the ventricles and sulci. Mild chronic small vessel ischemic changes noted within the deep periventricular white matter regions bilaterally. There is no mass, hemorrhage, edema or other evidence of acute parenchymal abnormality. No extra-axial hemorrhage. Vascular: There are chronic calcified atherosclerotic changes of the large vessels at the skull base. No unexpected hyperdense vessel. Skull: Normal. Negative for fracture or focal lesion. Sinuses/Orbits: No acute finding. Other: None. CT CERVICAL SPINE FINDINGS Alignment: Slightly accentuated kyphosis of the lower cervical spine related to underlying degenerative change. No evidence of acute subluxation. Skull base and vertebrae: No fracture line or displaced fracture fragment identified. Soft tissues and spinal canal: No prevertebral fluid or swelling. No visible canal hematoma. Disc levels: Degenerative change within the mid and lower cervical spine, mild to moderate in degree, with associated mild disc space narrowings and osseous spurring. No more than mild central canal stenosis at any level. Upper chest: Large right pleural effusion extending to the right lung apex, with probable adjacent atelectasis. Other: Heavy atherosclerotic calcifications at each carotid  bulb region and at each carotid origin. IMPRESSION: 1. No acute intracranial abnormality. No intracranial mass, hemorrhage or edema. No skull fracture. Atrophy and chronic ischemic changes in the white matter. 2. No fracture or acute subluxation within the cervical spine. Degenerative/chronic findings of the cervical spine, as detailed above. 3. Large right pleural effusion, extending to the right lung apex, with probable adjacent atelectasis. 4. Carotid atherosclerosis. Electronically Signed   By: Franki Cabot M.D.   On: 07/24/2016 21:42   Dg Chest Port 1 View  Result Date: 08/06/2016 CLINICAL DATA:  Hypoxia. EXAM: PORTABLE CHEST 1 VIEW COMPARISON:  Seven days prior FINDINGS: Progressive haziness of the right chest with the appearance of layering pleural effusion, moderate or large. Better aerated left chest. Status post CABG and aortic valve replacement. Single chamber pacer from the left in stable position. No pulmonary edema or pneumothorax noted. IMPRESSION: 1. Progressed right pleural effusion that is moderate to large. 2. Cardiomegaly. 3. No pulmonary edema. Electronically Signed   By: Monte Fantasia M.D.   On: 08/06/2016 09:37   Dg Chest Port 1 View  Result Date: 07/30/2016 CLINICAL DATA:  Recurrent RIGHT pleural effusion. EXAM: PORTABLE CHEST 1 VIEW COMPARISON:  07/28/2016 FINDINGS: Sternotomy wires overlie normal cardiac silhouette. Lung bases are  poorly evaluated. Fine pulmonary edema pattern similar to comparison exam. No pneumothorax. IMPRESSION: Cardiomegaly and mild pulmonary edema.  No significant change. Lung bases are poorly defined to body habitus and overlying soft tissue Electronically Signed   By: Suzy Bouchard M.D.   On: 07/30/2016 13:59   Dg Chest Port 1 View  Result Date: 07/27/2016 CLINICAL DATA:  Hypoxia. EXAM: PORTABLE CHEST 1 VIEW COMPARISON:  July 25, 2016 FINDINGS: The layering right pleural effusion is similar to mildly larger in the interval. There is probably a  tiny left effusion. Stable cardiomediastinal silhouette. Increasing bilateral pulmonary opacities, right greater than left, likely mildly worsened edema. No other change. IMPRESSION: Moderate to large a layering effusion on the right, similar to mildly worsened in the interval. Increasing pulmonary edema. Tiny left effusion. Electronically Signed   By: Dorise Bullion III M.D   On: 07/27/2016 07:45   Dg Chest Port 1 View  Result Date: 07/25/2016 CLINICAL DATA:  Respiratory difficulty EXAM: PORTABLE CHEST 1 VIEW COMPARISON:  Earlier today FINDINGS: Moderate to large layering right pleural effusion. There is chronic cardiomegaly. The patient is status post CABG and aortic valve replacement. Single chamber pacer from the left in stable position. Pulmonary venous congestion. IMPRESSION: Stable compared to earlier today. Cardiomegaly and venous congestion with moderate to large layering pleural effusion on the right. Electronically Signed   By: Monte Fantasia M.D.   On: 07/25/2016 20:24   Dg Chest Port 1 View  Result Date: 07/25/2016 CLINICAL DATA:  Dyspnea, desaturation when lying flat EXAM: PORTABLE CHEST 1 VIEW COMPARISON:  Portable exam 1745 hours compared to 07/24/2016 FINDINGS: LEFT subclavian pacemaker lead projects over RIGHT ventricle. Enlargement of cardiac silhouette post CABG and AVR. Pulmonary vascular congestion. RIGHT pleural effusion and basilar atelectasis. Perihilar edema. No pneumothorax. Bones demineralized. IMPRESSION: Mild pulmonary edema with persistent RIGHT pleural effusion and basilar atelectasis. Electronically Signed   By: Lavonia Dana M.D.   On: 07/25/2016 18:09   Dg Chest Port 1 View  Result Date: 07/12/2016 CLINICAL DATA:  Congestive heart failure and hypoxia up. Pleural effusions. EXAM: PORTABLE CHEST 1 VIEW COMPARISON:  07/09/2016 FINDINGS: Previous median sternotomy and valve replacement. Single lead pacemaker appears unchanged. There is pulmonary venous hypertension with mild  edema. This has probably improved slightly over the last 3 days. There is a moderate size right effusion with volume loss in the right lower lung. No pleural fluid seen on the left in this projection. IMPRESSION: Persistent congestive heart failure, but with slight improvement over the study 3 days ago. Persistent moderate size right effusion with volume loss in the right lower lung. Electronically Signed   By: Nelson Chimes M.D.   On: 07/12/2016 11:36   US Thoracentesis Asp Pleural Space W/img Guide  Result Date: 08/07/2016 INDICATION: Shortness of breath. Large right pleural effusion. Request therapeutic thoracentesis. EXAM: ULTRASOUND GUIDED RIGHT THORACENTESIS MEDICATIONS: None. COMPLICATIONS: None immediate. PROCEDURE: An ultrasound guided thoracentesis was thoroughly discussed with the patient and questions answered. The benefits, risks, alternatives and complications were also discussed. The patient understands and wishes to proceed with the procedure. Written consent was obtained. Ultrasound was performed to localize and mark an adequate pocket of fluid in the right chest. The area was then prepped and draped in the normal sterile fashion. 1% Lidocaine was used for local anesthesia. Under ultrasound guidance a Safe-T-Centesis catheter was introduced. Thoracentesis was performed. The catheter was removed and a dressing applied. FINDINGS: A total of approximately 2.3 L of clear, amber colored fluid  was removed. IMPRESSION: Successful ultrasound guided right thoracentesis yielding 2.3 L of pleural fluid. Read by: Ascencion Dike PA-C Electronically Signed   By: Aletta Edouard M.D.   On: 08/07/2016 16:20   US Thoracentesis Asp Pleural Space W/img Guide  Result Date: 07/28/2016 INDICATION: Patient with hypoxic respiratory failure and right pleural effusion. Request is made for diagnostic and therapeutic thoracentesis. EXAM: ULTRASOUND GUIDED DIAGNOSTIC AND THERAPEUTIC RIGHT THORACENTESIS MEDICATIONS: 10 mL  1% lidocaine COMPLICATIONS: None immediate. PROCEDURE: An ultrasound guided thoracentesis was thoroughly discussed with the patient and questions answered. The benefits, risks, alternatives and complications were also discussed. The patient understands and wishes to proceed with the procedure. Written consent was obtained. Ultrasound was performed to localize and mark an adequate pocket of fluid in the right chest. The area was then prepped and draped in the normal sterile fashion. 1% Lidocaine was used for local anesthesia. Under ultrasound guidance a Safe-T-centesis catheter was introduced. Thoracentesis was performed. The catheter was removed and a dressing applied. FINDINGS: A total of approximately 2.1 liters of clear, yellow fluid was removed. Samples were sent to the laboratory as requested by the clinical team. IMPRESSION: Successful ultrasound guided diagnostic and therapeutic right thoracentesis yielding 2.1 liters of pleural fluid. Read by:  Brynda Greathouse PA-C Electronically Signed   By: Corrie Mckusick D.O.   On: 07/28/2016 15:34    Micro Results   No results found for this or any previous visit (from the past 240 hour(s)).     Today   Subjective:   [name] today denies any complaints, no chest pain, no shortness of breath, no headache, no nausea or vomiting   Objective:   Blood pressure (!) 110/44, pulse 61, temperature 98.4 F (36.9 C), temperature source Axillary, resp. rate 20, height 5\' 10"  (1.778 m), weight 79.4 kg (175 lb 0.7 oz), SpO2 100 %.   Intake/Output Summary (Last 24 hours) at 08/08/16 1233 Last data filed at 08/08/16 1100  Gross per 24 hour  Intake              360 ml  Output             1550 ml  Net            -1190 ml    Exam General exam: Pleasant elderly male, moderately built and frail, chronically ill looking, lying comfortably propped up in bed. Respiratory system:Improved. Entry at right base Right base, otherwise clear to auscultation.  Respiratory effort normal. Cardiovascular system: S1 & S2 heard, RRR. No JVD, murmurs, rubs, gallops or clicks. No pedal edema.  Gastrointestinal system: Abdomen is nondistended, soft and nontender. No organomegaly or masses felt. Normal bowel sounds heard. Central nervous system: Alert and oriented 2. No focal neurological deficits. Extremities: Symmetric 5 x 5 power except right upper extremity which is immobilized in a sling.. Skin: Left great toe site wound VAC intact and draining slightly bloody fluid . Psychiatry: Judgement and insight impaired. Mood & affect appropriate.   Data Review   CBC w Diff:  Lab Results  Component Value Date   WBC 10.2 08/08/2016   HGB 8.7 (L) 08/08/2016   HGB 14.1 11/22/2010   HCT 29.7 (L) 08/08/2016   HCT 42.6 11/22/2010   PLT 262 08/08/2016   PLT 223 11/22/2010   LYMPHOPCT 12 07/28/2016   LYMPHOPCT 23.7 11/22/2010   MONOPCT 7 07/28/2016   MONOPCT 8.3 11/22/2010   EOSPCT 1 07/28/2016   EOSPCT 3.8 11/22/2010   BASOPCT 0 07/28/2016  BASOPCT 0.5 11/22/2010    CMP:  Lab Results  Component Value Date   NA 138 08/08/2016   K 4.3 08/08/2016   CL 100 (L) 08/08/2016   CO2 33 (H) 08/08/2016   BUN 38 (H) 08/08/2016   CREATININE 1.06 08/08/2016   CREATININE 1.17 03/19/2016   PROT 6.3 (L) 07/24/2016   ALBUMIN 2.1 (L) 07/24/2016   BILITOT 0.4 07/24/2016   ALKPHOS 134 (H) 07/24/2016   AST 50 (H) 07/24/2016   ALT 17 07/24/2016  .   Total Time in preparing paper work, data evaluation and todays exam - 35 minutes  Bailie Christenbury M.D on 08/08/2016 at 12:33 PM  [facility]   Office  [phone]

## 2016-08-08 NOTE — Consult Note (Signed)
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  Dressing procedure/placement/frequency:  Silicone foam to the left lateral foot and the right heel wounds.  Prevalon boots initiated by the bedside nursing staff.  Offload areas at all times, protect from further injury.  [facility] Nurse team will follow along with you for weekly wound assessments.  Please notify me of any acute changes in the wounds or any new areas of concerns [place] MSN, [place], CNS [phone]

## 2016-08-08 NOTE — Progress Notes (Signed)
Wound vac changed to home portable machine for transport to [facility] without any complications.  Sanda Linger

## 2016-08-09 ENCOUNTER — Emergency Department (HOSPITAL_COMMUNITY)
Admission: EM | Admit: 2016-08-09 | Discharge: 2016-08-09 | Disposition: A | Discharge status: Home / Self Care | Payer: Medicare Other | Attending: Emergency Medicine | Admitting: Emergency Medicine

## 2016-08-09 ENCOUNTER — Encounter (HOSPITAL_COMMUNITY): Payer: Self-pay

## 2016-08-09 ENCOUNTER — Emergency Department (HOSPITAL_COMMUNITY): Payer: Medicare Other

## 2016-08-09 DIAGNOSIS — I10 Essential (primary) hypertension: Secondary | ICD-10-CM | POA: Diagnosis not present

## 2016-08-09 DIAGNOSIS — I252 Old myocardial infarction: Secondary | ICD-10-CM | POA: Diagnosis not present

## 2016-08-09 DIAGNOSIS — Y929 Unspecified place or not applicable: Secondary | ICD-10-CM | POA: Diagnosis not present

## 2016-08-09 DIAGNOSIS — Z95820 Peripheral vascular angioplasty status with implants and grafts: Secondary | ICD-10-CM | POA: Diagnosis not present

## 2016-08-09 DIAGNOSIS — E876 Hypokalemia: Secondary | ICD-10-CM | POA: Diagnosis not present

## 2016-08-09 DIAGNOSIS — R5381 Other malaise: Secondary | ICD-10-CM | POA: Diagnosis not present

## 2016-08-09 DIAGNOSIS — D500 Iron deficiency anemia secondary to blood loss (chronic): Secondary | ICD-10-CM | POA: Diagnosis not present

## 2016-08-09 DIAGNOSIS — E1165 Type 2 diabetes mellitus with hyperglycemia: Secondary | ICD-10-CM | POA: Diagnosis not present

## 2016-08-09 DIAGNOSIS — Z7982 Long term (current) use of aspirin: Secondary | ICD-10-CM | POA: Diagnosis not present

## 2016-08-09 DIAGNOSIS — S4991XA Unspecified injury of right shoulder and upper arm, initial encounter: Secondary | ICD-10-CM | POA: Diagnosis not present

## 2016-08-09 DIAGNOSIS — T148XXA Other injury of unspecified body region, initial encounter: Secondary | ICD-10-CM | POA: Diagnosis not present

## 2016-08-09 DIAGNOSIS — Z89412 Acquired absence of left great toe: Secondary | ICD-10-CM | POA: Diagnosis not present

## 2016-08-09 DIAGNOSIS — E1151 Type 2 diabetes mellitus with diabetic peripheral angiopathy without gangrene: Secondary | ICD-10-CM | POA: Diagnosis not present

## 2016-08-09 DIAGNOSIS — J9601 Acute respiratory failure with hypoxia: Secondary | ICD-10-CM | POA: Diagnosis not present

## 2016-08-09 DIAGNOSIS — R1310 Dysphagia, unspecified: Secondary | ICD-10-CM | POA: Diagnosis not present

## 2016-08-09 DIAGNOSIS — Z79899 Other long term (current) drug therapy: Secondary | ICD-10-CM | POA: Diagnosis not present

## 2016-08-09 DIAGNOSIS — S72001D Fracture of unspecified part of neck of right femur, subsequent encounter for closed fracture with routine healing: Secondary | ICD-10-CM | POA: Diagnosis not present

## 2016-08-09 DIAGNOSIS — L89010 Pressure ulcer of right elbow, unstageable: Secondary | ICD-10-CM | POA: Diagnosis not present

## 2016-08-09 DIAGNOSIS — K5900 Constipation, unspecified: Secondary | ICD-10-CM | POA: Diagnosis not present

## 2016-08-09 DIAGNOSIS — Z781 Physical restraint status: Secondary | ICD-10-CM | POA: Diagnosis not present

## 2016-08-09 DIAGNOSIS — J810 Acute pulmonary edema: Secondary | ICD-10-CM | POA: Diagnosis not present

## 2016-08-09 DIAGNOSIS — G92 Toxic encephalopathy: Secondary | ICD-10-CM | POA: Diagnosis present

## 2016-08-09 DIAGNOSIS — Z951 Presence of aortocoronary bypass graft: Secondary | ICD-10-CM | POA: Insufficient documentation

## 2016-08-09 DIAGNOSIS — J9691 Respiratory failure, unspecified with hypoxia: Secondary | ICD-10-CM | POA: Diagnosis not present

## 2016-08-09 DIAGNOSIS — L89610 Pressure ulcer of right heel, unstageable: Secondary | ICD-10-CM | POA: Diagnosis not present

## 2016-08-09 DIAGNOSIS — Z7901 Long term (current) use of anticoagulants: Secondary | ICD-10-CM | POA: Diagnosis not present

## 2016-08-09 DIAGNOSIS — X58XXXA Exposure to other specified factors, initial encounter: Secondary | ICD-10-CM | POA: Diagnosis not present

## 2016-08-09 DIAGNOSIS — L89890 Pressure ulcer of other site, unstageable: Secondary | ICD-10-CM | POA: Diagnosis not present

## 2016-08-09 DIAGNOSIS — K250 Acute gastric ulcer with hemorrhage: Secondary | ICD-10-CM | POA: Diagnosis not present

## 2016-08-09 DIAGNOSIS — I5031 Acute diastolic (congestive) heart failure: Secondary | ICD-10-CM | POA: Insufficient documentation

## 2016-08-09 DIAGNOSIS — S42294A Other nondisplaced fracture of upper end of right humerus, initial encounter for closed fracture: Secondary | ICD-10-CM | POA: Diagnosis not present

## 2016-08-09 DIAGNOSIS — Z66 Do not resuscitate: Secondary | ICD-10-CM | POA: Diagnosis not present

## 2016-08-09 DIAGNOSIS — Z85828 Personal history of other malignant neoplasm of skin: Secondary | ICD-10-CM | POA: Insufficient documentation

## 2016-08-09 DIAGNOSIS — Z794 Long term (current) use of insulin: Secondary | ICD-10-CM | POA: Insufficient documentation

## 2016-08-09 DIAGNOSIS — I5033 Acute on chronic diastolic (congestive) heart failure: Secondary | ICD-10-CM | POA: Diagnosis present

## 2016-08-09 DIAGNOSIS — L89014 Pressure ulcer of right elbow, stage 4: Secondary | ICD-10-CM | POA: Diagnosis not present

## 2016-08-09 DIAGNOSIS — R4182 Altered mental status, unspecified: Secondary | ICD-10-CM | POA: Diagnosis not present

## 2016-08-09 DIAGNOSIS — R2689 Other abnormalities of gait and mobility: Secondary | ICD-10-CM | POA: Diagnosis not present

## 2016-08-09 DIAGNOSIS — T8131XA Disruption of external operation (surgical) wound, not elsewhere classified, initial encounter: Secondary | ICD-10-CM | POA: Diagnosis not present

## 2016-08-09 DIAGNOSIS — N183 Chronic kidney disease, stage 3 (moderate): Secondary | ICD-10-CM | POA: Diagnosis not present

## 2016-08-09 DIAGNOSIS — E1152 Type 2 diabetes mellitus with diabetic peripheral angiopathy with gangrene: Secondary | ICD-10-CM | POA: Diagnosis present

## 2016-08-09 DIAGNOSIS — S43004A Unspecified dislocation of right shoulder joint, initial encounter: Secondary | ICD-10-CM | POA: Diagnosis not present

## 2016-08-09 DIAGNOSIS — T50901A Poisoning by unspecified drugs, medicaments and biological substances, accidental (unintentional), initial encounter: Secondary | ICD-10-CM | POA: Diagnosis not present

## 2016-08-09 DIAGNOSIS — I130 Hypertensive heart and chronic kidney disease with heart failure and stage 1 through stage 4 chronic kidney disease, or unspecified chronic kidney disease: Secondary | ICD-10-CM | POA: Diagnosis present

## 2016-08-09 DIAGNOSIS — J690 Pneumonitis due to inhalation of food and vomit: Secondary | ICD-10-CM | POA: Diagnosis not present

## 2016-08-09 DIAGNOSIS — I2510 Atherosclerotic heart disease of native coronary artery without angina pectoris: Secondary | ICD-10-CM | POA: Diagnosis not present

## 2016-08-09 DIAGNOSIS — I959 Hypotension, unspecified: Secondary | ICD-10-CM | POA: Diagnosis not present

## 2016-08-09 DIAGNOSIS — R488 Other symbolic dysfunctions: Secondary | ICD-10-CM | POA: Diagnosis not present

## 2016-08-09 DIAGNOSIS — T50901D Poisoning by unspecified drugs, medicaments and biological substances, accidental (unintentional), subsequent encounter: Secondary | ICD-10-CM | POA: Diagnosis not present

## 2016-08-09 DIAGNOSIS — L89899 Pressure ulcer of other site, unspecified stage: Secondary | ICD-10-CM | POA: Diagnosis not present

## 2016-08-09 DIAGNOSIS — R451 Restlessness and agitation: Secondary | ICD-10-CM | POA: Diagnosis not present

## 2016-08-09 DIAGNOSIS — M6281 Muscle weakness (generalized): Secondary | ICD-10-CM | POA: Diagnosis not present

## 2016-08-09 DIAGNOSIS — L89159 Pressure ulcer of sacral region, unspecified stage: Secondary | ICD-10-CM | POA: Diagnosis not present

## 2016-08-09 DIAGNOSIS — D696 Thrombocytopenia, unspecified: Secondary | ICD-10-CM | POA: Diagnosis not present

## 2016-08-09 DIAGNOSIS — I482 Chronic atrial fibrillation: Secondary | ICD-10-CM | POA: Diagnosis not present

## 2016-08-09 DIAGNOSIS — T50904A Poisoning by unspecified drugs, medicaments and biological substances, undetermined, initial encounter: Secondary | ICD-10-CM | POA: Diagnosis not present

## 2016-08-09 DIAGNOSIS — E43 Unspecified severe protein-calorie malnutrition: Secondary | ICD-10-CM | POA: Diagnosis not present

## 2016-08-09 DIAGNOSIS — R296 Repeated falls: Secondary | ICD-10-CM | POA: Diagnosis not present

## 2016-08-09 DIAGNOSIS — R0902 Hypoxemia: Secondary | ICD-10-CM | POA: Diagnosis not present

## 2016-08-09 DIAGNOSIS — S42201S Unspecified fracture of upper end of right humerus, sequela: Secondary | ICD-10-CM | POA: Diagnosis not present

## 2016-08-09 DIAGNOSIS — I5032 Chronic diastolic (congestive) heart failure: Secondary | ICD-10-CM | POA: Diagnosis not present

## 2016-08-09 DIAGNOSIS — N179 Acute kidney failure, unspecified: Secondary | ICD-10-CM | POA: Diagnosis not present

## 2016-08-09 DIAGNOSIS — Y999 Unspecified external cause status: Secondary | ICD-10-CM | POA: Insufficient documentation

## 2016-08-09 DIAGNOSIS — R2681 Unsteadiness on feet: Secondary | ICD-10-CM | POA: Diagnosis not present

## 2016-08-09 DIAGNOSIS — S42211A Unspecified displaced fracture of surgical neck of right humerus, initial encounter for closed fracture: Secondary | ICD-10-CM | POA: Diagnosis not present

## 2016-08-09 DIAGNOSIS — T424X1A Poisoning by benzodiazepines, accidental (unintentional), initial encounter: Secondary | ICD-10-CM | POA: Diagnosis present

## 2016-08-09 DIAGNOSIS — F0390 Unspecified dementia without behavioral disturbance: Secondary | ICD-10-CM | POA: Diagnosis present

## 2016-08-09 DIAGNOSIS — R05 Cough: Secondary | ICD-10-CM | POA: Diagnosis not present

## 2016-08-09 DIAGNOSIS — M25511 Pain in right shoulder: Secondary | ICD-10-CM | POA: Diagnosis not present

## 2016-08-09 DIAGNOSIS — Z950 Presence of cardiac pacemaker: Secondary | ICD-10-CM | POA: Diagnosis not present

## 2016-08-09 DIAGNOSIS — G4733 Obstructive sleep apnea (adult) (pediatric): Secondary | ICD-10-CM | POA: Diagnosis not present

## 2016-08-09 DIAGNOSIS — E785 Hyperlipidemia, unspecified: Secondary | ICD-10-CM | POA: Diagnosis present

## 2016-08-09 DIAGNOSIS — S91301S Unspecified open wound, right foot, sequela: Secondary | ICD-10-CM | POA: Diagnosis not present

## 2016-08-09 DIAGNOSIS — I509 Heart failure, unspecified: Secondary | ICD-10-CM | POA: Diagnosis not present

## 2016-08-09 DIAGNOSIS — Y939 Activity, unspecified: Secondary | ICD-10-CM | POA: Diagnosis not present

## 2016-08-09 DIAGNOSIS — R269 Unspecified abnormalities of gait and mobility: Secondary | ICD-10-CM | POA: Diagnosis not present

## 2016-08-09 DIAGNOSIS — J90 Pleural effusion, not elsewhere classified: Secondary | ICD-10-CM | POA: Diagnosis not present

## 2016-08-09 DIAGNOSIS — S91302D Unspecified open wound, left foot, subsequent encounter: Secondary | ICD-10-CM | POA: Diagnosis not present

## 2016-08-09 DIAGNOSIS — J189 Pneumonia, unspecified organism: Secondary | ICD-10-CM | POA: Diagnosis not present

## 2016-08-09 DIAGNOSIS — E10621 Type 1 diabetes mellitus with foot ulcer: Secondary | ICD-10-CM | POA: Diagnosis not present

## 2016-08-09 DIAGNOSIS — R0602 Shortness of breath: Secondary | ICD-10-CM | POA: Diagnosis not present

## 2016-08-09 DIAGNOSIS — I739 Peripheral vascular disease, unspecified: Secondary | ICD-10-CM | POA: Diagnosis not present

## 2016-08-09 DIAGNOSIS — L97524 Non-pressure chronic ulcer of other part of left foot with necrosis of bone: Secondary | ICD-10-CM | POA: Diagnosis not present

## 2016-08-09 DIAGNOSIS — S42201A Unspecified fracture of upper end of right humerus, initial encounter for closed fracture: Secondary | ICD-10-CM

## 2016-08-09 DIAGNOSIS — D509 Iron deficiency anemia, unspecified: Secondary | ICD-10-CM | POA: Diagnosis not present

## 2016-08-09 DIAGNOSIS — I779 Disorder of arteries and arterioles, unspecified: Secondary | ICD-10-CM | POA: Diagnosis not present

## 2016-08-09 DIAGNOSIS — I110 Hypertensive heart disease with heart failure: Secondary | ICD-10-CM | POA: Insufficient documentation

## 2016-08-09 DIAGNOSIS — L089 Local infection of the skin and subcutaneous tissue, unspecified: Secondary | ICD-10-CM | POA: Diagnosis not present

## 2016-08-09 DIAGNOSIS — E119 Type 2 diabetes mellitus without complications: Secondary | ICD-10-CM | POA: Diagnosis not present

## 2016-08-09 DIAGNOSIS — R278 Other lack of coordination: Secondary | ICD-10-CM | POA: Diagnosis not present

## 2016-08-09 DIAGNOSIS — L02612 Cutaneous abscess of left foot: Secondary | ICD-10-CM | POA: Diagnosis not present

## 2016-08-09 DIAGNOSIS — R0989 Other specified symptoms and signs involving the circulatory and respiratory systems: Secondary | ICD-10-CM | POA: Diagnosis not present

## 2016-08-09 DIAGNOSIS — L97512 Non-pressure chronic ulcer of other part of right foot with fat layer exposed: Secondary | ICD-10-CM | POA: Diagnosis not present

## 2016-08-09 DIAGNOSIS — T8781 Dehiscence of amputation stump: Secondary | ICD-10-CM | POA: Diagnosis not present

## 2016-08-09 DIAGNOSIS — G308 Other Alzheimer's disease: Secondary | ICD-10-CM | POA: Diagnosis not present

## 2016-08-09 DIAGNOSIS — S43001A Unspecified subluxation of right shoulder joint, initial encounter: Secondary | ICD-10-CM | POA: Diagnosis not present

## 2016-08-09 DIAGNOSIS — I4891 Unspecified atrial fibrillation: Secondary | ICD-10-CM | POA: Diagnosis not present

## 2016-08-09 DIAGNOSIS — T43591A Poisoning by other antipsychotics and neuroleptics, accidental (unintentional), initial encounter: Secondary | ICD-10-CM | POA: Diagnosis not present

## 2016-08-09 DIAGNOSIS — E870 Hyperosmolality and hypernatremia: Secondary | ICD-10-CM | POA: Diagnosis present

## 2016-08-09 DIAGNOSIS — F22 Delusional disorders: Secondary | ICD-10-CM | POA: Diagnosis not present

## 2016-08-09 DIAGNOSIS — I998 Other disorder of circulatory system: Secondary | ICD-10-CM | POA: Diagnosis present

## 2016-08-09 DIAGNOSIS — F419 Anxiety disorder, unspecified: Secondary | ICD-10-CM | POA: Diagnosis not present

## 2016-08-09 DIAGNOSIS — J9611 Chronic respiratory failure with hypoxia: Secondary | ICD-10-CM | POA: Diagnosis not present

## 2016-08-09 DIAGNOSIS — Z89422 Acquired absence of other left toe(s): Secondary | ICD-10-CM | POA: Diagnosis not present

## 2016-08-09 DIAGNOSIS — F0151 Vascular dementia with behavioral disturbance: Secondary | ICD-10-CM | POA: Diagnosis not present

## 2016-08-09 DIAGNOSIS — I442 Atrioventricular block, complete: Secondary | ICD-10-CM | POA: Diagnosis not present

## 2016-08-09 DIAGNOSIS — Z515 Encounter for palliative care: Secondary | ICD-10-CM | POA: Diagnosis not present

## 2016-08-09 DIAGNOSIS — E11621 Type 2 diabetes mellitus with foot ulcer: Secondary | ICD-10-CM | POA: Diagnosis not present

## 2016-08-09 DIAGNOSIS — J9621 Acute and chronic respiratory failure with hypoxia: Secondary | ICD-10-CM | POA: Diagnosis not present

## 2016-08-09 DIAGNOSIS — L89150 Pressure ulcer of sacral region, unstageable: Secondary | ICD-10-CM | POA: Diagnosis not present

## 2016-08-09 LAB — CK TOTAL AND CKMB (NOT AT ARMC)
CK TOTAL: 11 U/L — AB (ref 49–397)
CK, MB: 3.4 ng/mL (ref 0.5–5.0)
Relative Index: INVALID (ref 0.0–2.5)

## 2016-08-09 LAB — TROPONIN I: TROPONIN I: 0.06 ng/mL — AB (ref <0.03)

## 2016-08-09 MED ORDER — METOPROLOL TARTRATE 12.5 MG HALF TABLET
12.5000 mg | ORAL_TABLET | Freq: Two times a day (BID) | ORAL | Status: DC
  Administered 2016-08-09: 12.5 mg via ORAL
  Filled 2016-08-09: qty 1

## 2016-08-09 NOTE — Discharge Instructions (Signed)
-   Patient needs to keep right arm in sling at all times  - The known fracture to the right humerus has increased displacement and may require surgical fixation. I spoke to the orthopedist on call for [facility] who has reviewed the images. Patient will need to be seen in their office on Monday by Dr. Ninfa Linden.

## 2016-08-09 NOTE — Clinical Social Work Placement (Signed)
   CLINICAL SOCIAL WORK PLACEMENT  NOTE  Date:  08/09/2016  Patient Details  Name: [name]. MRN: [name] Date of Birth: 03-Mar-1935  Clinical Social Work is seeking post-discharge placement for this patient at the [place] level of care (*CSW will initial, date and re-position this form in  chart as items are completed):  Yes   Patient/family provided with [place] Work Department's list of facilities offering this level of care within the geographic area requested by the patient (or if unable, by the patient's family).  Yes   Patient/family informed of their freedom to choose among providers that offer the needed level of care, that participate in Medicare, Medicaid or managed care program needed by the patient, have an available bed and are willing to accept the patient.  Yes   Patient/family informed of [facility]'s ownership interest in [facility] and [facility], as well as of the fact that they are under no obligation to receive care at these facilities.  PASRR submitted to EDS on 07/18/16     PASRR number received on       Existing PASRR number confirmed on 07/18/16     FL2 transmitted to all facilities in geographic area requested by pt/family on 07/18/16     FL2 transmitted to all facilities within larger geographic area on       Patient informed that his/her managed care company has contracts with or will negotiate with certain facilities, including the following:        Yes   Patient/family informed of bed offers received.  Patient chooses bed at  [facility])     Physician recommends and patient chooses bed at      Patient to be transferred to  [facility]) on 08/09/16.  Patient to be transferred to facility by  [facility])     Patient family notified on 08/09/16 of transfer.  Name of family member notified:  [name]     PHYSICIAN       Additional Comment:     _______________________________________________ Serafina Mitchell, [place] 08/09/2016, 9:57 AM

## 2016-08-09 NOTE — ED Triage Notes (Signed)
To hallway bed via EMS.  Pt d/c from [facility] today.  Pt was sent to [facility] rehab. S/p left great toe amputation with wound vac in place to continuous suction 125 mmHg with bilateral pressure boots on.  .  Staff found pt with right arm behind back, obvious deformity to right shoulder.  Had arm sling from previous humerous fx.  Pt denies pain.

## 2016-08-09 NOTE — Clinical Social Work Note (Signed)
Clinical Social Worker facilitated patient discharge including contacting patient family (Wife) and facility to confirm patient discharge plans. Clinical information faxed to facility [facility]) and family agreeable with plan.  CSW arranged ambulance transport via [facility] to [facility]. RN to call report  [phone] prior to discharge.  Clinical Social Worker will sign off for now as social work intervention is no longer needed. Please consult [place] again if new need arises.  Haruko Mersch B. Joline Maxcy Clinical Social Work Dept Weekend Social Worker [phone] 9:54 AM

## 2016-08-09 NOTE — Discharge Summary (Signed)
[name]., is a 82 y.o. male  DOB 01-May-1935  MRN [id number].  Admission date:  07/24/2016  Admitting Physician  Norval Morton, MD  Discharge Date:  08/09/2016   Primary MD  [name], MD  No significant updates on discharge summary dictated 2/16, patient was seen and examined, no acute events overnight. Recommendations for primary care physician for things to follow:  - Patient resumed on warfarin, monitor INR and adjust warfarin dose as needed.  - Check CBC, BMP in 3 days - Patient to be seen by SNF physician in 48 hours. - Patient on oxygen 3-4 liters nasal cannula, try to wean back to baseline at 2 L nasal cannula - Please repeat chest x-ray in 1 week to reevaluate for recurrence of right pleural effusion - patient to follow with vascular surgery as an outpatient appointment scheduled for 2/23 - will need follow-up with orthopedic Dr. Meridee Score in 1 week regarding right humerus fracture - Would recommend palliative care consult at facility   Admission Diagnosis  evaluation right humorous  Nonhealing left great toe amputation site T81.89XA FOBT positive. Anemia.   Discharge Diagnosis  evaluation right humorous  Nonhealing left great toe amputation site T81.89XA FOBT positive. Anemia.    Principal Problem:   Right humeral fracture Active Problems:   DM (diabetes mellitus) type II uncontrolled, periph vascular disorder ([name])   S/P AVR (aortic valve replacement)   Chronic atrial fibrillation ([name])   Chronic diastolic heart failure ([name])   Pleural effusion   Anemia   Supratherapeutic INR   Chronic respiratory failure ([place])   Fall   Alzheimer's dementia   Fracture of humerus neck, right, closed, initial encounter   NSVT (nonsustained ventricular tachycardia) ([name])   PVC (premature ventricular contraction)   Acute gastric ulcer with hemorrhage      Past Medical History:    Diagnosis Date  . Aortal stenosis 2006   biophrostetic  . Arthritis   . Atrial fibrillation ([place])    onset after CABG--- coumadin management at [facility]madin clinic  . Carotid artery disease ([place]) 09/27/2010   Carotid US 12/17: bilat ICA 40-59 >> 1 year FU  . CHF (congestive heart failure) ([place])    abter CABG  . Complete heart block ([place])   . Coronary artery disease   . DM type 2 (diabetes mellitus, type 2) ([place])    used to see Dr Meredith Pel, now f/u by Dr Larose Kells  . Erectile dysfunction   . Gangrene ([place])   . History of nephrolithiasis   . HTN (hypertension)   . Hyperlipidemia   . Memory loss   . Obesity   . OSA (obstructive sleep apnea)    not using CPAP  . Pacemaker 2/07   VVI  . PVD (peripheral vascular disease) ([name])    CT angio showed- vascular insuff, intestine and RAS bilaterally, andgiogram 02/2009 severe PVD medical management  . S/P aortic valve replacement   . Sick sinus syndrome ([name])    [name]  . Skin  cancer    surgery (nose) 09-2010    Past Surgical History:  Procedure Laterality Date  . ABDOMINAL AORTAGRAM N/A 11/10/2012   Procedure: ABDOMINAL Maxcine Ham;  Surgeon: Serafina Mitchell, MD;  Location: [facility] CATH LAB;  Service: Cardiovascular;  Laterality: N/A;  . AMPUTATION  01/2010   great toe  . AMPUTATION Left 07/16/2016   Procedure: RECECTION OF LEFT GREAT TOE METATARSAL HEAD and sesamoid;  Surgeon: Waynetta Sandy, MD;  Location: [place];  Service: Vascular;  Laterality: Left;  . AMPUTATION Left 07/31/2016   Procedure: SECOND TOE AMPUTATION;  Surgeon: Serafina Mitchell, MD;  Location: [place] OR;  Service: Vascular;  Laterality: Left;  . ANGIOPLASTY / STENTING FEMORAL  02/10/10   Left femoral  . ANGIOPLASTY ILLIAC ARTERY Left 07/16/2016   Procedure: drug coated Balloon ANGIOPLASTY of left superficial femoral artry stent;  Surgeon: Waynetta Sandy, MD;  Location: [place];  Service: Vascular;  Laterality: Left;  . AORTIC VALVE REPLACEMENT     biophrostetic  for Ao Stenosis 2006  . AORTIC VALVE REPLACEMENT  01/17/2011   S/P redo median sternotomy, extracorporeal circulation, redo Aortic Valve Replacement using a 23-mm Edwards pericardial Magna-Ease valve, Dr Ulla Gallo  . AORTOGRAM N/A 07/16/2016   Procedure: left lower extremity angiogram;  Surgeon: Waynetta Sandy, MD;  Location: [name];  Service: Vascular;  Laterality: N/A;  . APPLICATION OF WOUND VAC Left 07/31/2016   Procedure: APPLICATION OF WOUND VAC;  Surgeon: Serafina Mitchell, MD;  Location: [place];  Service: Vascular;  Laterality: Left;  . CATARACT EXTRACTION, BILATERAL    . CORONARY ARTERY BYPASS GRAFT  2006  . EP IMPLANTABLE DEVICE N/A 06/06/2016   Procedure: PPM Generator Changeout;  Surgeon: Thompson Grayer, MD;  Location: [facility] CV LAB;  Service: Cardiovascular;  Laterality: N/A;  . EYE SURGERY    . ILIAC ATHERECTOMY Left 07/16/2016   Procedure: Angioplasty and ATHERECTOMY of left anterior tibial artery;  Surgeon: Waynetta Sandy, MD;  Location: [place];  Service: Vascular;  Laterality: Left;  . INCISE AND DRAIN ABCESS Right May 2014   right heel   . LOWER EXTREMITY ANGIOGRAM Left 11/23/2012   Procedure: LOWER EXTREMITY ANGIOGRAM;  Surgeon: Serafina Mitchell, MD;  Location: [facility] CATH LAB;  Service: Cardiovascular;  Laterality: Left;  . PACEMAKER PLACEMENT  07/2005   Medtronic Sigma SR implanted by Dr Olevia Perches  . PERIPHERAL VASCULAR CATHETERIZATION N/A 07/10/2016   Procedure: Abdominal Aortogram;  Surgeon: Conrad Simms, MD;  Location: [place] CV LAB;  Service: Cardiovascular;  Laterality: N/A;  . PERIPHERAL VASCULAR CATHETERIZATION Bilateral 07/10/2016   Procedure: Lower Extremity Angiography;  Surgeon: Conrad Evansburg, MD;  Location: [place] CV LAB;  Service: Cardiovascular;  Laterality: Bilateral;  . TONSILLECTOMY    . ULTRASOUND GUIDANCE FOR VASCULAR ACCESS Bilateral 07/16/2016   Procedure: ULTRASOUND GUIDANCE FOR VASCULAR ACCESS of right common femoral and left dorsalis  pedis;  Surgeon: Waynetta Sandy, MD;  Location: [facility] OR;  Service: Vascular;  Laterality: Bilateral;  . WOUND DEBRIDEMENT Left 07/31/2016   Procedure: DEBRIDEMENT GREAT TOE AMPUTATION SITE;  Surgeon: Serafina Mitchell, MD;  Location: [place];  Service: Vascular;  Laterality: Left;       History of present illness and  Hospital Course:     Kindly see H&P for history of present illness and admission details, please review complete Labs, Consult reports and Test reports for all details in brief  HPI  from the history and physical done on the  day of admission 07/24/2016  HPI: Dimitrios Sachtleben. is a 82 y.o. male with medical history significant ofsevere AS s/p redo aVR (bioprosthetic valve), diastolic CHF, CAD s/p PM & CABG, A.fib on Coumadin, hx left atrial thrombus by TEE in 7/12, PVD, COPD oxygen dependent on 2L [place], DM 2, left foot osteomyelitis s/p amputation, Alz dementia; who presents after a fall. History is obtained from the patient's wife and review of records as patient has significant dementia. Patient was just recently hospitalized 1/11-1/29 to the hospitalists service for acute respiratory failure with hypoxia. Patient seen to have a large right-sided pleural effusion for which he underwent thoracentesis which showed a transit date of fluid. He also received IV diuretics and was transitioned to oral Lasix. diabetic foot ulcer with osteomyelitis treated with antibiotics while hospitalized. Patient had only been at [facility] for 3 days, but wife notes that he was more lethargic than usual and his short-term memory was significantly worse. Patient reportedly fell sometime this morning around 12:30 and was found on the floor. Patient wife notes since he's had a pacemaker placed he's progressively declined.   ED Course: Upon admission to the emergency department patient was seen to be afebrile, pulse 59-76, respirations 16- 24, blood pressure as low as 93/48, O2 saturation 90-100% on home  2 L of [place]. Lab work revealed WBC 12.6, hemoglobin 7.3(baseline  13 in 05/2016), creatinine 1.26, BUN 44, glucose 205, BNP 167, INR 3.6, troponin 0.08. X-ray images showed no acute intracranial abnormalities ,but did reveal a right humeral neck fracture. Patient was given 1 g of oral vitamin K for elevated INR, typed and screen for blood products and ordered. Transfuse 1 unit of packed red blood cells.  Hospital Course   82 year old male with PMH of severe AS s/p redo AVR (bioprosthetic valve), chronic diastolic CHF, CAD status post CABG, complete heart block s/p PPM, A. fib on chronic Coumadin, history of left atrial thrombus by TEE 7/12, PAD, COPD on home oxygen 2 L/m, type II DM, HTN, HLD, OSA not on CPAP, left foot osteomyelitis status post amputation of great toe, Alzheimer's dementia, recent hospitalization 1/11-1/29 for acute respiratory failure with hypoxia, noted to have a large right-sided pleural effusion for which she underwent thoracentesis, diabetic foot ulcer with osteomyelitis treated with antibiotics and then discharged to [facility] for 3 days, presented back to the ED following a fall on day of admission, found on floor. Also noted more lethargic than usual and worsening short-term memory. X-rays confirmed right humeral neck fracture. Hemoglobin 7.3. Vascular surgery consulted for left foot wound management-underwent I&D of left great toe on 2/6 and 2/8. Cardiology consulted for NSVT. Orthopedics consulted for right humerus fracture and recommended nonoperative management.  Right humeral neck fracture:  - Sustained status post fall at SNF. X-ray of right shoulder 07/24/16 confirmed displaced mildly comminuted humeral neck fracture and no evidence of intra-articular extension. Continue right shoulder sling. Orthopedics consultation appreciated: Confirmed right proximal humerus fracture of mainly the surgical neck which can be treated with just a sling. Outpatient follow-up with Dr. Beverely Low  duda in 1 week.  Ortho Recommendation(Recommend with the patient and family that he would be most comfortable with his head elevated or in a reclining chair. Will hold on mobilization of the shoulder for 2 weeks I will review reevaluate in 2 weeks with repeat radiographs and anticipate starting range of motion at that time)  Acute on chronic anemia:  secondary to upper GI bleed from gastric ulcer -  Hemoglobin 12 g in December 2017 but has progressively dropped and was 7.3 on admission.  far required 4 units PRBC transfusion this hospital stay, he was Hemoccult positive, GI consulted, status post EGD, significant for 2 or oozing superficial gastric ulcer status post clip placement for hemostasis, cleared by GI to resume on warfarin, will resume at 3 mg oral at bedtime, to continue with Protonix 40 mg twice a day  Chronic A. fib:  Heart rate controlled,CHA2DS2VASc score is 6. Metoprolol started by cardiology for NSVT which should help A. fib too, back on warfarin.  Right-sided pleural effusion, recurrent:  - Status post 2.1 L thoracentesis on 2/5, and repeat thoracentesis on 2/15 with 2.1 L drained, transudate differential, continue to monitor closely and repeat EKG in 1 week for follow-up  Chronic hypoxic respiratory failure:  - On home oxygen 2 L/m. Some concern for dysphagia on 2/8. Speech therapy evaluated 2/9 and recommend regular consistency diet and thin liquids.  Stage III chronic kidney disease:  - Stable.  Mild hyperkalemia:  - Resolved.  Chronic diastolic CHF:  - Clinically euvolemic. Continue oral Lasix.   CAD status post CABG: -  Asymptomatic of chest pain.  Complete heart block status post PPM:  - Monitor on telemetry which also showed episodes of NSVT.   Status post bioprosthetic AVR  Type II DM: - Reasonable inpatient control on SSI.  Essential hypertension: Soft blood pressure, started on metoprolol for nonsustained VT, stopped lisinopril  S/P  angioplasty and stenting LLE and left great toe amputation:  - As per vascular surgery follow-up, underwent excisional debridement of left great toe amputation site on 2/6 night and repeat I&D in all OR on 2/8 followed by negative pressure dressing. Vascular surgery has seen and cleared for discharge. Outpatient follow-up with vascular surgery.  Nonsustained VT:  - Monitored on telemetry. Cardiology consultation and follow-up appreciated. Started on metoprolol. 2-D echo 07/31/16: LVEF 55-60 percent, normal wall motion without regional wall motion abnormalities and severe pulmonary hypertension. Metoprolol started and Increased metoprolol to 25 MG twice a day. NSVT burden has decreased. Potassium 4.4. Magnesium 1.8. Replace to keep >2.  Dementia:  - Moderate at least. Continue Aricept.  Adult failure to thrive: - Apparently has been declining for some time now.Consider palliative care consultation for goals of care >can be done at SNF.Marland Kitchen   Discharge Condition:  Stable at time of discharge   Follow UP   Contact information for follow-up providers    Newt Minion, MD Follow up in 2 week(s).   Specialty:  Orthopedic Surgery Contact information: [place] [address] [phone]        Annamarie Major, MD Follow up on 08/15/2016.   Specialties:  Vascular Surgery, Cardiology Why:  Appt with NP for wound check @ 9:15 am Contact information: [address] [place] [address] [phone]            Contact information for after-discharge care    Destination    [facility] Follow up.   Specialty:  [place] information: [place] [phone]                    Discharge Instructions  and  Discharge Medications   Discharge Instructions    Discharge instructions    Complete by:  As directed    Follow with Primary MD [name], MD after discharge from SNF  Get CBC,  CMP, 2 view Chest  X ray checked  by Primary MD next visit.    Activity: As tolerated with Full fall precautions use walker/cane & assistance as needed   Disposition SNF   Diet: Heart Healthy  , with feeding assistance and aspiration precautions.  For Heart failure patients - Check your Weight same time everyday, if you gain over 2 pounds, or you develop in leg swelling, experience more shortness of breath or chest pain, call your Primary MD immediately. Follow Cardiac Low Salt Diet and 1.5 lit/day fluid restriction.   On your next visit with your primary care physician please Get Medicines reviewed and adjusted.   Please request your Prim.MD to go over all Hospital Tests and Procedure/Radiological results at the follow up, please get all Hospital records sent to your Prim MD by signing hospital release before you go home.   If you experience worsening of your admission symptoms, develop shortness of breath, life threatening emergency, suicidal or homicidal thoughts you must seek medical attention immediately by calling 911 or calling your MD immediately  if symptoms less severe.  You Must read complete instructions/literature along with all the possible adverse reactions/side effects for all the Medicines you take and that have been prescribed to you. Take any new Medicines after you have completely understood and accpet all the possible adverse reactions/side effects.   Do not drive, operating heavy machinery, perform activities at heights, swimming or participation in water activities or provide baby sitting services if your were admitted for syncope or siezures until you have seen by Primary MD or a Neurologist and advised to do so again.  Do not drive when taking Pain medications.    Do not take more than prescribed Pain, Sleep and Anxiety Medications  Special Instructions: If you have smoked or chewed Tobacco  in the last 2 yrs please stop smoking, stop any regular Alcohol  and or  any Recreational drug use.  Wear Seat belts while driving.   Please note  You were cared for by a hospitalist during your hospital stay. If you have any questions about your discharge medications or the care you received while you were in the hospital after you are discharged, you can call the unit and asked to speak with the hospitalist on call if the hospitalist that took care of you is not available. Once you are discharged, your primary care physician will handle any further medical issues. Please note that NO REFILLS for any discharge medications will be authorized once you are discharged, as it is imperative that you return to your primary care physician (or establish a relationship with a primary care physician if you do not have one) for your aftercare needs so that they can reassess your need for medications and monitor your lab values.   Increase activity slowly    Complete by:  As directed      Allergies as of 08/09/2016      Reactions   Potassium-containing Compounds Anaphylaxis   IV--loss of conciousness   Metoprolol Succinate Other (See Comments)   Extremely tired      Medication List    STOP taking these medications   lisinopril 10 MG tablet Commonly known as:  PRINIVIL,ZESTRIL   OVER THE COUNTER MEDICATION     TAKE these medications   acetaminophen 325 MG tablet Commonly known as:  TYLENOL Take 2 tablets (650 mg total) by mouth every 6 (six) hours as needed for mild pain (or Fever >/= 101). What changed:  when to take this  reasons to take this   albuterol (2.5 MG/3ML) 0.083% nebulizer solution Commonly known as:  PROVENTIL Take 3 mLs (2.5 mg total) by nebulization every 4 (four) hours as needed for wheezing or shortness of breath.   aspirin 81 MG EC tablet Take 1 tablet (81 mg total) by mouth daily.   DECUBI-VITE Caps Take 1 capsule by mouth daily.   docusate sodium 100 MG capsule Commonly known as:  COLACE Take 1 capsule (100 mg total) by mouth 2  (two) times daily.   donepezil 10 MG tablet Commonly known as:  ARICEPT Take 1 tablet (10 mg total) by mouth at bedtime.   ezetimibe-simvastatin 10-40 MG tablet Commonly known as:  VYTORIN Take 1 tablet by mouth at bedtime.   ferrous sulfate 325 (65 FE) MG tablet Take 1 tablet (325 mg total) by mouth 2 (two) times daily with a meal.   furosemide 40 MG tablet Commonly known as:  LASIX Take 1 tablet (40 mg total) by mouth 2 (two) times daily.   insulin aspart 100 UNIT/ML injection Commonly known as:  novoLOG Inject 0-9 Units into the skin 3 (three) times daily with meals. Sliding scale insulin Less than 70 initiate hypoglycemia protocol 70-120  0 units 120-150 1 unit 151-200 2 units 201-250 3 units 251-300 5 units 301-350 7 units 351-400 9 units  Greater than 400 call MD   insulin glargine 100 UNIT/ML injection Commonly known as:  LANTUS Inject 0.05 mLs (5 Units total) into the skin at bedtime. What changed:  how much to take   ipratropium 0.02 % nebulizer solution Commonly known as:  ATROVENT Take 2.5 mLs (0.5 mg total) by nebulization every 4 (four) hours as needed for wheezing or shortness of breath.   metoprolol tartrate 25 MG tablet Commonly known as:  LOPRESSOR Take 1 tablet (25 mg total) by mouth 2 (two) times daily.   nystatin powder Commonly known as:  MYCOSTATIN/NYSTOP Apply topically 3 (three) times daily. Apply to groin daily   pantoprazole 40 MG tablet Commonly known as:  PROTONIX Take 1 tablet (40 mg total) by mouth 2 (two) times daily.   saccharomyces boulardii 250 MG capsule Commonly known as:  FLORASTOR Take 1 capsule (250 mg total) by mouth 2 (two) times daily.   warfarin 3 MG tablet Commonly known as:  COUMADIN Take 1 tablet (3 mg total) by mouth daily at 6 PM. What changed:  how much to take         Diet and Activity recommendation: See Discharge Instructions above   Consults obtained -  Vascular surgery  Interventional  radiology  Cardiology   Orthopedics  Gastroenterology  Major procedures and Radiology Reports - PLEASE review detailed and final reports for all details, in brief -    Therapeutic right thoracentesis on 07/28/16, and 08/07/2016  Left great toe amputation site debridement by vascular surgery on 07/29/16  I&D left great toe 07/31/16  EGD 2/15 bilateral lower GI, significant for oozing gastric ulcer status post clip placement for home he status is   Dg Chest 1 View  Result Date: 08/07/2016 CLINICAL DATA:  Post right thoracentesis EXAM: CHEST 1 VIEW COMPARISON:  08/06/2016 FINDINGS: Significant decrease in right pleural effusion following thoracentesis. Small remaining right pleural effusion. No pneumothorax. Bilateral airspace disease and vascular congestion most compatible with pulmonary edema which has progressed in the interval. Single lead transvenous pacemaker noted. Prior CABG and aortic valve replacement IMPRESSION: Decreased right pleural effusion following thoracentesis. No pneumothorax Progression of airspace disease consistent  with edema. Electronically Signed   By: Franchot Gallo M.D.   On: 08/07/2016 16:22   Dg Chest 1 View  Result Date: 07/28/2016 CLINICAL DATA:  82 year old male status post right chest ultrasound-guided thoracentesis today. Initial encounter. EXAM: CHEST 1 VIEW COMPARISON:  07/27/2016 and earlier. FINDINGS: AP semi upright view at 1513 hours. Decreased right pleural effusion with no pneumothorax. Small volume residual including some pleural fluid in the minor fissure. Improved right mid and lower lung ventilation. Stable cardiomegaly and mediastinal contours. Stable left lung. IMPRESSION: Decreased right pleural effusion and increased right lung ventilation with no pneumothorax. Electronically Signed   By: Genevie Ann M.D.   On: 07/28/2016 15:32   Dg Chest 1 View  Result Date: 07/24/2016 CLINICAL DATA:  [facility] day nursing facility. EXAM: CHEST 1 VIEW  COMPARISON:  07/14/2016 FINDINGS: Mildly comminuted and displaced proximal right humeral fracture. Moderate vascular and interstitial prominence, worsened. Central and basilar airspace opacities likely represent alveolar edema. Moderate right pleural effusion, unchanged or slightly enlarged. Unchanged cardiomegaly. The transvenous cardiac lead appears grossly intact. Prior sternotomy with CABG and aortic valvuloplasty. IMPRESSION: Interstitial and vascular changes, as well as central airspace opacities, consistent with congestive heart failure. Moderate right pleural effusion. Electronically Signed   By: Andreas Newport M.D.   On: 07/24/2016 21:06   Dg Chest 2 View  Result Date: 07/14/2016 CLINICAL DATA:  Shortness of breath. EXAM: CHEST  2 VIEW COMPARISON:  07/12/2016 FINDINGS: The heart is mildly enlarged but stable. Stable right ventricular pacer wire. The lungs demonstrate much improved aeration with resolving pulmonary edema. There is a persistent right-sided pleural effusion and overlying atelectasis. IMPRESSION: Resolving pulmonary edema. Persistent small right pleural effusion and overlying atelectasis. Electronically Signed   By: Marijo Sanes M.D.   On: 07/14/2016 12:06   Dg Shoulder Right  Result Date: 07/24/2016 CLINICAL DATA:  Fall with right arm pain. Humerus fracture on outside radiograph. EXAM: RIGHT SHOULDER - 2+ VIEW COMPARISON:  None. FINDINGS: Oblique fracture of the humeral neck is mildly comminuted. There is 18 mm lateral and mild anterior displacement of the humeral diaphysis. No evidence of extension to the glenohumeral joint. Chronic deformity of the right acromioclavicular joint fragmentation. Vascular calcifications are seen. IMPRESSION: Displaced mildly comminuted humeral neck fracture. No evidence of intra-articular extension. Electronically Signed   By: Jeb Levering M.D.   On: 07/24/2016 21:07   Ct Head Wo Contrast  Result Date: 07/24/2016 CLINICAL DATA:  Status post  fall. EXAM: CT HEAD WITHOUT CONTRAST CT CERVICAL SPINE WITHOUT CONTRAST TECHNIQUE: Multidetector CT imaging of the head and cervical spine was performed following the standard protocol without intravenous contrast. Multiplanar CT image reconstructions of the cervical spine were also generated. COMPARISON:  Head CT dated 01/08/2011. FINDINGS: CT HEAD FINDINGS Brain: There is generalized age related parenchymal atrophy with commensurate dilatation of the ventricles and sulci. Mild chronic small vessel ischemic changes noted within the deep periventricular white matter regions bilaterally. There is no mass, hemorrhage, edema or other evidence of acute parenchymal abnormality. No extra-axial hemorrhage. Vascular: There are chronic calcified atherosclerotic changes of the large vessels at the skull base. No unexpected hyperdense vessel. Skull: Normal. Negative for fracture or focal lesion. Sinuses/Orbits: No acute finding. Other: None. CT CERVICAL SPINE FINDINGS Alignment: Slightly accentuated kyphosis of the lower cervical spine related to underlying degenerative change. No evidence of acute subluxation. Skull base and vertebrae: No fracture line or displaced fracture fragment identified. Soft tissues and spinal canal: No prevertebral fluid or  swelling. No visible canal hematoma. Disc levels: Degenerative change within the mid and lower cervical spine, mild to moderate in degree, with associated mild disc space narrowings and osseous spurring. No more than mild central canal stenosis at any level. Upper chest: Large right pleural effusion extending to the right lung apex, with probable adjacent atelectasis. Other: Heavy atherosclerotic calcifications at each carotid bulb region and at each carotid origin. IMPRESSION: 1. No acute intracranial abnormality. No intracranial mass, hemorrhage or edema. No skull fracture. Atrophy and chronic ischemic changes in the white matter. 2. No fracture or acute subluxation within the  cervical spine. Degenerative/chronic findings of the cervical spine, as detailed above. 3. Large right pleural effusion, extending to the right lung apex, with probable adjacent atelectasis. 4. Carotid atherosclerosis. Electronically Signed   By: Franki Cabot M.D.   On: 07/24/2016 21:42   Ct Cervical Spine Wo Contrast  Result Date: 07/24/2016 CLINICAL DATA:  Status post fall. EXAM: CT HEAD WITHOUT CONTRAST CT CERVICAL SPINE WITHOUT CONTRAST TECHNIQUE: Multidetector CT imaging of the head and cervical spine was performed following the standard protocol without intravenous contrast. Multiplanar CT image reconstructions of the cervical spine were also generated. COMPARISON:  Head CT dated 01/08/2011. FINDINGS: CT HEAD FINDINGS Brain: There is generalized age related parenchymal atrophy with commensurate dilatation of the ventricles and sulci. Mild chronic small vessel ischemic changes noted within the deep periventricular white matter regions bilaterally. There is no mass, hemorrhage, edema or other evidence of acute parenchymal abnormality. No extra-axial hemorrhage. Vascular: There are chronic calcified atherosclerotic changes of the large vessels at the skull base. No unexpected hyperdense vessel. Skull: Normal. Negative for fracture or focal lesion. Sinuses/Orbits: No acute finding. Other: None. CT CERVICAL SPINE FINDINGS Alignment: Slightly accentuated kyphosis of the lower cervical spine related to underlying degenerative change. No evidence of acute subluxation. Skull base and vertebrae: No fracture line or displaced fracture fragment identified. Soft tissues and spinal canal: No prevertebral fluid or swelling. No visible canal hematoma. Disc levels: Degenerative change within the mid and lower cervical spine, mild to moderate in degree, with associated mild disc space narrowings and osseous spurring. No more than mild central canal stenosis at any level. Upper chest: Large right pleural effusion extending  to the right lung apex, with probable adjacent atelectasis. Other: Heavy atherosclerotic calcifications at each carotid bulb region and at each carotid origin. IMPRESSION: 1. No acute intracranial abnormality. No intracranial mass, hemorrhage or edema. No skull fracture. Atrophy and chronic ischemic changes in the white matter. 2. No fracture or acute subluxation within the cervical spine. Degenerative/chronic findings of the cervical spine, as detailed above. 3. Large right pleural effusion, extending to the right lung apex, with probable adjacent atelectasis. 4. Carotid atherosclerosis. Electronically Signed   By: Franki Cabot M.D.   On: 07/24/2016 21:42   Dg Chest Port 1 View  Result Date: 08/06/2016 CLINICAL DATA:  Hypoxia. EXAM: PORTABLE CHEST 1 VIEW COMPARISON:  Seven days prior FINDINGS: Progressive haziness of the right chest with the appearance of layering pleural effusion, moderate or large. Better aerated left chest. Status post CABG and aortic valve replacement. Single chamber pacer from the left in stable position. No pulmonary edema or pneumothorax noted. IMPRESSION: 1. Progressed right pleural effusion that is moderate to large. 2. Cardiomegaly. 3. No pulmonary edema. Electronically Signed   By: Monte Fantasia M.D.   On: 08/06/2016 09:37   Dg Chest Port 1 View  Result Date: 07/30/2016 CLINICAL DATA:  Recurrent RIGHT pleural effusion.  EXAM: PORTABLE CHEST 1 VIEW COMPARISON:  07/28/2016 FINDINGS: Sternotomy wires overlie normal cardiac silhouette. Lung bases are poorly evaluated. Fine pulmonary edema pattern similar to comparison exam. No pneumothorax. IMPRESSION: Cardiomegaly and mild pulmonary edema.  No significant change. Lung bases are poorly defined to body habitus and overlying soft tissue Electronically Signed   By: Suzy Bouchard M.D.   On: 07/30/2016 13:59   Dg Chest Port 1 View  Result Date: 07/27/2016 CLINICAL DATA:  Hypoxia. EXAM: PORTABLE CHEST 1 VIEW COMPARISON:  July 25, 2016 FINDINGS: The layering right pleural effusion is similar to mildly larger in the interval. There is probably a tiny left effusion. Stable cardiomediastinal silhouette. Increasing bilateral pulmonary opacities, right greater than left, likely mildly worsened edema. No other change. IMPRESSION: Moderate to large a layering effusion on the right, similar to mildly worsened in the interval. Increasing pulmonary edema. Tiny left effusion. Electronically Signed   By: Dorise Bullion III M.D   On: 07/27/2016 07:45   Dg Chest Port 1 View  Result Date: 07/25/2016 CLINICAL DATA:  Respiratory difficulty EXAM: PORTABLE CHEST 1 VIEW COMPARISON:  Earlier today FINDINGS: Moderate to large layering right pleural effusion. There is chronic cardiomegaly. The patient is status post CABG and aortic valve replacement. Single chamber pacer from the left in stable position. Pulmonary venous congestion. IMPRESSION: Stable compared to earlier today. Cardiomegaly and venous congestion with moderate to large layering pleural effusion on the right. Electronically Signed   By: Monte Fantasia M.D.   On: 07/25/2016 20:24   Dg Chest Port 1 View  Result Date: 07/25/2016 CLINICAL DATA:  Dyspnea, desaturation when lying flat EXAM: PORTABLE CHEST 1 VIEW COMPARISON:  Portable exam 1745 hours compared to 07/24/2016 FINDINGS: LEFT subclavian pacemaker lead projects over RIGHT ventricle. Enlargement of cardiac silhouette post CABG and AVR. Pulmonary vascular congestion. RIGHT pleural effusion and basilar atelectasis. Perihilar edema. No pneumothorax. Bones demineralized. IMPRESSION: Mild pulmonary edema with persistent RIGHT pleural effusion and basilar atelectasis. Electronically Signed   By: Lavonia Dana M.D.   On: 07/25/2016 18:09   Dg Chest Port 1 View  Result Date: 07/12/2016 CLINICAL DATA:  Congestive heart failure and hypoxia up. Pleural effusions. EXAM: PORTABLE CHEST 1 VIEW COMPARISON:  07/09/2016 FINDINGS: Previous median  sternotomy and valve replacement. Single lead pacemaker appears unchanged. There is pulmonary venous hypertension with mild edema. This has probably improved slightly over the last 3 days. There is a moderate size right effusion with volume loss in the right lower lung. No pleural fluid seen on the left in this projection. IMPRESSION: Persistent congestive heart failure, but with slight improvement over the study 3 days ago. Persistent moderate size right effusion with volume loss in the right lower lung. Electronically Signed   By: Nelson Chimes M.D.   On: 07/12/2016 11:36   US Thoracentesis Asp Pleural Space W/img Guide  Result Date: 08/07/2016 INDICATION: Shortness of breath. Large right pleural effusion. Request therapeutic thoracentesis. EXAM: ULTRASOUND GUIDED RIGHT THORACENTESIS MEDICATIONS: None. COMPLICATIONS: None immediate. PROCEDURE: An ultrasound guided thoracentesis was thoroughly discussed with the patient and questions answered. The benefits, risks, alternatives and complications were also discussed. The patient understands and wishes to proceed with the procedure. Written consent was obtained. Ultrasound was performed to localize and mark an adequate pocket of fluid in the right chest. The area was then prepped and draped in the normal sterile fashion. 1% Lidocaine was used for local anesthesia. Under ultrasound guidance a Safe-T-Centesis catheter was introduced. Thoracentesis was performed. The catheter  was removed and a dressing applied. FINDINGS: A total of approximately 2.3 L of clear, amber colored fluid was removed. IMPRESSION: Successful ultrasound guided right thoracentesis yielding 2.3 L of pleural fluid. Read by: Ascencion Dike PA-C Electronically Signed   By: Aletta Edouard M.D.   On: 08/07/2016 16:20   US Thoracentesis Asp Pleural Space W/img Guide  Result Date: 07/28/2016 INDICATION: Patient with hypoxic respiratory failure and right pleural effusion. Request is made for  diagnostic and therapeutic thoracentesis. EXAM: ULTRASOUND GUIDED DIAGNOSTIC AND THERAPEUTIC RIGHT THORACENTESIS MEDICATIONS: 10 mL 1% lidocaine COMPLICATIONS: None immediate. PROCEDURE: An ultrasound guided thoracentesis was thoroughly discussed with the patient and questions answered. The benefits, risks, alternatives and complications were also discussed. The patient understands and wishes to proceed with the procedure. Written consent was obtained. Ultrasound was performed to localize and mark an adequate pocket of fluid in the right chest. The area was then prepped and draped in the normal sterile fashion. 1% Lidocaine was used for local anesthesia. Under ultrasound guidance a Safe-T-centesis catheter was introduced. Thoracentesis was performed. The catheter was removed and a dressing applied. FINDINGS: A total of approximately 2.1 liters of clear, yellow fluid was removed. Samples were sent to the laboratory as requested by the clinical team. IMPRESSION: Successful ultrasound guided diagnostic and therapeutic right thoracentesis yielding 2.1 liters of pleural fluid. Read by:  Brynda Greathouse PA-C Electronically Signed   By: Corrie Mckusick D.O.   On: 07/28/2016 15:34    Micro Results   No results found for this or any previous visit (from the past 240 hour(s)).     Today   Subjective:   Tayjon Trettel today denies any complaints, no chest pain, no shortness of breath, no headache, no nausea or vomiting   Objective:   Blood pressure 111/66, pulse 60, temperature 98.2 F (36.8 C), temperature source Oral, resp. rate 18, height 5\' 10"  (1.778 m), weight 79.4 kg (175 lb 0.7 oz), SpO2 92 %.   Intake/Output Summary (Last 24 hours) at 08/09/16 1205 Last data filed at 08/09/16 0355  Gross per 24 hour  Intake               60 ml  Output              600 ml  Net             -540 ml    Exam General exam: Pleasant elderly male, moderately built and frail, chronically ill looking, lying comfortably  propped up in bed. Respiratory system:Improved. Entry at right base Right base, otherwise clear to auscultation. Respiratory effort normal. Cardiovascular system: S1 & S2 heard, RRR. No JVD, murmurs, rubs, gallops or clicks. No pedal edema.  Gastrointestinal system: Abdomen is nondistended, soft and nontender. No organomegaly or masses felt. Normal bowel sounds heard. Central nervous system: Alert and oriented 2. No focal neurological deficits. Extremities: Symmetric 5 x 5 power except right upper extremity which is immobilized in a sling.. Skin: Left great toe site wound VAC intact and draining slightly bloody fluid . Psychiatry: Judgement and insight impaired. Mood & affect appropriate.   Data Review   CBC w Diff:  Lab Results  Component Value Date   WBC 9.5 08/08/2016   HGB 8.0 (L) 08/08/2016   HGB 14.1 11/22/2010   HCT 26.7 (L) 08/08/2016   HCT 42.6 11/22/2010   PLT 260 08/08/2016   PLT 223 11/22/2010   LYMPHOPCT 12 07/28/2016   LYMPHOPCT 23.7 11/22/2010   MONOPCT 7 07/28/2016  MONOPCT 8.3 11/22/2010   EOSPCT 1 07/28/2016   EOSPCT 3.8 11/22/2010   BASOPCT 0 07/28/2016   BASOPCT 0.5 11/22/2010    CMP:  Lab Results  Component Value Date   NA 135 08/08/2016   K 4.1 08/08/2016   CL 96 (L) 08/08/2016   CO2 33 (H) 08/08/2016   BUN 38 (H) 08/08/2016   CREATININE 1.09 08/08/2016   CREATININE 1.17 03/19/2016   PROT 6.1 (L) 08/08/2016   ALBUMIN 1.9 (L) 08/08/2016   BILITOT 0.8 08/08/2016   ALKPHOS 174 (H) 08/08/2016   AST 53 (H) 08/08/2016   ALT 34 08/08/2016  .    [facility], Shaquoya Cosper M.D on 08/09/2016 at 12:05 PM  [facility]   Office  [phone]

## 2016-08-09 NOTE — Clinical Social Work Placement (Deleted)
   CLINICAL SOCIAL WORK PLACEMENT  NOTE  Date:  08/09/2016  Patient Details  Name: [name]. MRN: [name] Date of Birth: Oct 10, 1934  Clinical Social Work is seeking post-discharge placement for this patient at the [place] level of care (*CSW will initial, date and re-position this form in  chart as items are completed):  Yes   Patient/family provided with [facility] Work Department's list of facilities offering this level of care within the geographic area requested by the patient (or if unable, by the patient's family).  Yes   Patient/family informed of their freedom to choose among providers that offer the needed level of care, that participate in Medicare, Medicaid or managed care program needed by the patient, have an available bed and are willing to accept the patient.  Yes   Patient/family informed of [facility]'s ownership interest in [facility] and [facility], as well as of the fact that they are under no obligation to receive care at these facilities.  PASRR submitted to EDS on       PASRR number received on       Existing PASRR number confirmed on       FL2 transmitted to all facilities in geographic area requested by pt/family on       FL2 transmitted to all facilities within larger geographic area on       Patient informed that his/her managed care company has contracts with or will negotiate with certain facilities, including the following:            Patient/family informed of bed offers received.  Patient chooses bed at       Physician recommends and patient chooses bed at      Patient to be transferred to   on  .  Patient to be transferred to facility by       Patient family notified on   of transfer.  Name of family member notified:        PHYSICIAN       Additional Comment:    _______________________________________________ Serafina Mitchell, [name] 08/09/2016, 9:55 AM

## 2016-08-09 NOTE — Progress Notes (Signed)
CRITICAL VALUE ALERT  Critical value received:  Troponin 0.06  Date of notification:  08/08/16  Time of notification:  2359  Critical value read back:yes  Nurse who received alert:  Loma Boston, RN  MD notified (1st page):  Dr. Maudie Mercury  Time of first page:  2359  MD notified (2nd page):  Time of second page:  Responding MD:  Dr. Maudie Mercury  Time MD responded:  [phone]

## 2016-08-09 NOTE — ED Provider Notes (Signed)
[facility] DEPT Provider Note   CSN: [id number] Arrival date & time: 08/09/16  1921     History   Chief Complaint Chief Complaint  Patient presents with  . Shoulder Injury    HPI Seth Mowad. is a 82 y.o. male. Patient is a 82 year old male with significant medical history notable for aortic stenosis, CHF, CAD, A. fib on Coumadin, PVD, COPD, DM, dementia, and osteomyelitis who presents for evaluation of a right shoulder injury. Patient was just discharged from our facility this morning to a SNF. He was admitted for about 2-1/2 weeks after he fell at the same SNF and suffered a right humerus fracture. His humerus fracture was managed nonoperatively. His hospital stay was, located by anemia, right-sided pleural effusion, hypoxic respiratory failure. He was found at the SNF in his bed with his right arm twisted behind his back. The staff felt that his right shoulder deformity was worse than when he arrived at the SNF this morning. He was sent here for further evaluation. Patient is unable to provide history given his dementia. He denies any pain. The facility states that he did not suffer a fall today.  HPI  Past Medical History:  Diagnosis Date  . Aortal stenosis 2006   biophrostetic  . Arthritis   . Atrial fibrillation ([place])    onset after CABG--- coumadin management [facility]  . Carotid artery disease ([place]) 09/27/2010   Carotid US 12/17: bilat ICA 40-59 >> 1 year FU  . CHF (congestive heart failure) ([place])    abter CABG  . Complete heart block ([place])   . Coronary artery disease   . DM type 2 (diabetes mellitus, type 2) ([place])    used to see Dr Meredith Pel, now f/u by Dr Larose Kells  . Erectile dysfunction   . Gangrene ([place])   . History of nephrolithiasis   . HTN (hypertension)   . Hyperlipidemia   . Memory loss   . Obesity   . OSA (obstructive sleep apnea)    not using CPAP  . Pacemaker 2/07   VVI  . PVD (peripheral vascular disease) ([name])    CT angio showed- vascular  insuff, intestine and RAS bilaterally, andgiogram 02/2009 severe PVD medical management  . S/P aortic valve replacement   . Sick sinus syndrome ([name])    [name]  . Skin cancer    surgery (nose) 09-2010    Patient Active Problem List   Diagnosis Date Noted  . Acute gastric ulcer with hemorrhage   . PVC (premature ventricular contraction)   . NSVT (nonsustained ventricular tachycardia) ([place])   . Right humeral fracture 07/25/2016  . Supratherapeutic INR 07/25/2016  . Chronic respiratory failure ([place]) 07/25/2016  . Fall 07/25/2016  . Alzheimer's dementia 07/25/2016  . Fracture of humerus neck, right, closed, initial encounter   . Anemia 07/24/2016  . Wound abscess   . Congestive heart failure ([place])   . S/P thoracentesis   . Malnutrition of moderate degree 07/04/2016  . Pleural effusion 07/04/2016  . Diabetic foot ulcer with osteomyelitis ([place]) 07/03/2016  . Hypoxia 07/03/2016  . Acute diastolic CHF (congestive heart failure) ([place]) 07/03/2016  . Abscess of abdominal wall 06/02/2014  . Abdominal wall abscess 06/02/2014  . Hyponatremia 06/02/2014  . Cellulitis 12/23/2013  . Aftercare following surgery of the circulatory system, [place] 10/05/2013  . Encounter for therapeutic drug monitoring 07/18/2013  . Tingling 11/01/2012  . Hypoxemia 08/04/2012  . Fatigue 01/20/2012  . Memory loss 01/20/2012  . Chronic  diastolic heart failure ([place]) 09/01/2011  . Chronic atrial fibrillation ([place]) 08/21/2011  . Paresthesia 12/16/2010  . Medicare annual wellness visit, initial 10/10/2010  . Carotid artery disease ([place]) 09/27/2010  . Long term (current) use of anticoagulants 09/26/2010  . THYROID NODULE 01/26/2010  . PVD (peripheral vascular disease) ([place]) 01/02/2009  . HIP PAIN, BILATERAL 01/02/2009  . HLD (hyperlipidemia) 08/16/2008  . MYOCARDIAL INFARCTION, HX OF 08/16/2008  . S/P AVR (aortic valve replacement) 08/16/2008  . PERIPHERAL VASCULAR DISEASE WITH CLAUDICATION  08/16/2008  . ANEMIA, HX OF 08/16/2008  . AORTIC VALVE REPLACEMENT, HX OF 08/16/2008  . PACEMAKER, PERMANENT 08/16/2008  . ERECTILE DYSFUNCTION 11/26/2006  . DM (diabetes mellitus) type II uncontrolled, periph vascular disorder ([place]) 07/28/2006  . OBESITY, MORBID 07/28/2006  . Obstructive sleep apnea 07/28/2006  . Essential hypertension 07/28/2006  . CAD (coronary artery disease) 07/28/2006  . RENAL ARTERY STENOSIS 07/28/2006  . INSUFFICIENCY, VASCULAR NOS, INTESTINE 07/28/2006  . NEPHROLITHIASIS, HX OF 07/28/2006    Past Surgical History:  Procedure Laterality Date  . ABDOMINAL AORTAGRAM N/A 11/10/2012   Procedure: ABDOMINAL Maxcine Ham;  Surgeon: Serafina Mitchell, MD;  Location: [facility] CATH LAB;  Service: Cardiovascular;  Laterality: N/A;  . AMPUTATION  01/2010   great toe  . AMPUTATION Left 07/16/2016   Procedure: RECECTION OF LEFT GREAT TOE METATARSAL HEAD and sesamoid;  Surgeon: Waynetta Sandy, MD;  Location: [facility];  Service: Vascular;  Laterality: Left;  . AMPUTATION Left 07/31/2016   Procedure: SECOND TOE AMPUTATION;  Surgeon: Serafina Mitchell, MD;  Location: [facility] OR;  Service: Vascular;  Laterality: Left;  . ANGIOPLASTY / STENTING FEMORAL  02/10/10   Left femoral  . ANGIOPLASTY ILLIAC ARTERY Left 07/16/2016   Procedure: drug coated Balloon ANGIOPLASTY of left superficial femoral artry stent;  Surgeon: Waynetta Sandy, MD;  Location: [facility];  Service: Vascular;  Laterality: Left;  . AORTIC VALVE REPLACEMENT     biophrostetic for Ao Stenosis 2006  . AORTIC VALVE REPLACEMENT  01/17/2011   S/P redo median sternotomy, extracorporeal circulation, redo Aortic Valve Replacement using a 23-mm Edwards pericardial Magna-Ease valve, Dr Ulla Gallo  . AORTOGRAM N/A 07/16/2016   Procedure: left lower extremity angiogram;  Surgeon: Waynetta Sandy, MD;  Location: [facility];  Service: Vascular;  Laterality: N/A;  . APPLICATION OF WOUND VAC Left 07/31/2016   Procedure: APPLICATION OF  WOUND VAC;  Surgeon: Serafina Mitchell, MD;  Location: [facility];  Service: Vascular;  Laterality: Left;  . CATARACT EXTRACTION, BILATERAL    . CORONARY ARTERY BYPASS GRAFT  2006  . EP IMPLANTABLE DEVICE N/A 06/06/2016   Procedure: PPM Generator Changeout;  Surgeon: Thompson Grayer, MD;  Location: [facility] CV LAB;  Service: Cardiovascular;  Laterality: N/A;  . EYE SURGERY    . ILIAC ATHERECTOMY Left 07/16/2016   Procedure: Angioplasty and ATHERECTOMY of left anterior tibial artery;  Surgeon: Waynetta Sandy, MD;  Location: [facility];  Service: Vascular;  Laterality: Left;  . INCISE AND DRAIN ABCESS Right May 2014   right heel   . LOWER EXTREMITY ANGIOGRAM Left 11/23/2012   Procedure: LOWER EXTREMITY ANGIOGRAM;  Surgeon: Serafina Mitchell, MD;  Location: [facility] CATH LAB;  Service: Cardiovascular;  Laterality: Left;  . PACEMAKER PLACEMENT  07/2005   Medtronic Sigma SR implanted by Dr Olevia Perches  . PERIPHERAL VASCULAR CATHETERIZATION N/A 07/10/2016   Procedure: Abdominal Aortogram;  Surgeon: Conrad [place], MD;  Location: [facility] CV LAB;  Service: Cardiovascular;  Laterality: N/A;  .  PERIPHERAL VASCULAR CATHETERIZATION Bilateral 07/10/2016   Procedure: Lower Extremity Angiography;  Surgeon: Conrad [place], MD;  Location: [facility] CV LAB;  Service: Cardiovascular;  Laterality: Bilateral;  . TONSILLECTOMY    . ULTRASOUND GUIDANCE FOR VASCULAR ACCESS Bilateral 07/16/2016   Procedure: ULTRASOUND GUIDANCE FOR VASCULAR ACCESS of right common femoral and left dorsalis pedis;  Surgeon: Waynetta Sandy, MD;  Location: [facility] OR;  Service: Vascular;  Laterality: Bilateral;  . WOUND DEBRIDEMENT Left 07/31/2016   Procedure: DEBRIDEMENT GREAT TOE AMPUTATION SITE;  Surgeon: Serafina Mitchell, MD;  Location: [facility] OR;  Service: Vascular;  Laterality: Left;       Home Medications    Prior to Admission medications   Medication Sig Start Date End Date Taking? Authorizing Provider  acetaminophen (TYLENOL) 325 MG tablet  Take 2 tablets (650 mg total) by mouth every 6 (six) hours as needed for mild pain (or Fever >/= 101). 08/08/16   Silver Huguenin Elgergawy, MD  albuterol (PROVENTIL) (2.5 MG/3ML) 0.083% nebulizer solution Take 3 mLs (2.5 mg total) by nebulization every 4 (four) hours as needed for wheezing or shortness of breath. 08/08/16   Albertine Patricia, MD  aspirin EC 81 MG EC tablet Take 1 tablet (81 mg total) by mouth daily. 07/22/16   Oswald [place], MD  docusate sodium (COLACE) 100 MG capsule Take 1 capsule (100 mg total) by mouth 2 (two) times daily. 08/08/16   Silver Huguenin Elgergawy, MD  donepezil (ARICEPT) 10 MG tablet Take 1 tablet (10 mg total) by mouth at bedtime. 05/08/16   Kathrynn Ducking, MD  ezetimibe-simvastatin (VYTORIN) 10-40 MG per tablet Take 1 tablet by mouth at bedtime. 05/06/12   Larey Dresser, MD  ferrous sulfate 325 (65 FE) MG tablet Take 1 tablet (325 mg total) by mouth 2 (two) times daily with a meal. 08/08/16   Albertine Patricia, MD  furosemide (LASIX) 40 MG tablet Take 1 tablet (40 mg total) by mouth 2 (two) times daily. 07/21/16   Oswald [place], MD  insulin aspart (NOVOLOG) 100 UNIT/ML injection Inject 0-9 Units into the skin 3 (three) times daily with meals. Sliding scale insulin Less than 70 initiate hypoglycemia protocol 70-120  0 units 120-150 1 unit 151-200 2 units 201-250 3 units 251-300 5 units 301-350 7 units 351-400 9 units  Greater than 400 call MD 07/21/16   Oswald [place], MD  insulin glargine (LANTUS) 100 UNIT/ML injection Inject 0.05 mLs (5 Units total) into the skin at bedtime. 08/08/16   Silver Huguenin Elgergawy, MD  ipratropium (ATROVENT) 0.02 % nebulizer solution Take 2.5 mLs (0.5 mg total) by nebulization every 4 (four) hours as needed for wheezing or shortness of breath. 08/08/16   Silver Huguenin Elgergawy, MD  metoprolol tartrate (LOPRESSOR) 25 MG tablet Take 1 tablet (25 mg total) by mouth 2 (two) times daily. 08/08/16   Silver Huguenin Elgergawy, MD  Multiple Vitamins-Minerals (DECUBI-VITE)  CAPS Take 1 capsule by mouth daily.    Historical Provider, MD  nystatin (MYCOSTATIN/NYSTOP) powder Apply topically 3 (three) times daily. Apply to groin daily 08/08/16 08/15/16  Silver Huguenin Elgergawy, MD  pantoprazole (PROTONIX) 40 MG tablet Take 1 tablet (40 mg total) by mouth 2 (two) times daily. 08/08/16   Silver Huguenin Elgergawy, MD  saccharomyces boulardii (FLORASTOR) 250 MG capsule Take 1 capsule (250 mg total) by mouth 2 (two) times daily. 08/08/16   Albertine Patricia, MD  warfarin (COUMADIN) 3 MG tablet Take 1 tablet (3 mg total) by  mouth daily at 6 PM. 08/08/16   Albertine Patricia, MD    Family History Family History  Problem Relation Age of Onset  . Heart attack Father 70  . Heart disease Father     Before age 63  . Hypertension Father   . Stroke Mother 45  . Cancer Sister     Cervical  . Heart attack Sister   . Heart disease Sister   . Diabetes Sister     2 sister w/ DM  . Heart attack Sister   . Heart attack Sister   . Heart disease Sister 13    AAA  and  Stomach Aneurysm  . AAA (abdominal aortic aneurysm) Sister   . Heart disease Sister     Before age 33  . Heart attack Sister 70  . Colon cancer Neg Hx   . Prostate cancer Neg Hx     Social History Social History  Substance Use Topics  . Smoking status: Former Smoker    Types: Cigarettes    Quit date: 06/23/1988  . Smokeless tobacco: Former Systems developer  . Alcohol use No     Allergies   Potassium-containing compounds and Metoprolol succinate   Review of Systems Review of Systems  Unable to perform ROS: Dementia     Physical Exam Updated Vital Signs BP (!) 118/48   Pulse (!) 57   Temp 97.3 F (36.3 C) (Oral)   Resp 18   SpO2 100%   Physical Exam  Constitutional: He appears well-developed.  Chronically ill-appearing  HENT:  Head: Normocephalic and atraumatic.  Eyes: Conjunctivae are normal.  Neck: Neck supple.  Cardiovascular: Normal rate.   No murmur heard. Pulmonary/Chest: Effort normal. No respiratory  distress.  On 4 l/min via [place]  Abdominal: Soft. There is no tenderness.  Musculoskeletal: He exhibits deformity. He exhibits no edema.  Deformity noted to R shoulder. 1+ radial pulse on right. ROM in elbow and wrist is full. No tenderness noted about the shoulder.  Neurological: He is alert.  Oriented to name only  Skin: Skin is warm and dry.  Psychiatric: He has a normal mood and affect.  Nursing note and vitals reviewed.    ED Treatments / Results  Labs (all labs ordered are listed, but only abnormal results are displayed) Labs Reviewed - No data to display  EKG  EKG Interpretation None       Radiology Dg Shoulder Right  Result Date: 08/09/2016 CLINICAL DATA:  Acute onset of right arm pain, with right shoulder deformity. Recently diagnosed right humeral neck fracture. Subsequent encounter. EXAM: RIGHT SHOULDER - 2+ VIEW COMPARISON:  Right shoulder radiographs performed 07/24/2016 FINDINGS: There is superior subluxation of the right humeral head, with mildly increased widening of the right acromioclavicular joint to 4 cm. There is also increased anterior displacement of the distal humerus, measuring approximately 1 shaft width at the level of the humeral neck fracture. Overlying soft tissue swelling is noted. No additional fractures are identified. There appears to be some degree of winging of the scapula. Diffuse vascular calcifications are seen. IMPRESSION: 1. Increased anterior displacement of the distal humerus, measuring approximately 1 shaft width at the level of the patient's right humeral neck fracture. 2. Superior subluxation of the right humeral head, with mildly increased widening of the right acromioclavicular joint to 4 cm. 3. Diffuse vascular calcifications seen. Electronically Signed   By: Garald Balding M.D.   On: 08/09/2016 20:49    Procedures Procedures (including critical care time)  Medications  Ordered in ED Medications - No data to display   Initial  Impression / Assessment and Plan / ED Course  I have reviewed the triage vital signs and the nursing notes.  Pertinent labs & imaging results that were available during my care of the patient were reviewed by me and considered in my medical decision making (see chart for details).    Patient with complex medical history as above presents due to reinjury of his right shoulder. Patient was just discharged from this facility earlier today to a SNF. Patient has a known right proximal humerus fracture, treated nonoperatively. He was found at the nursing facility in bed with his right arm underneath his back. There was a worse deformity than when he arrived to the SNF earlier today. Patient has dementia and is unable to contribute to history. He does not appear to be in any pain here. X-ray was repeated which showed displacement of the fracture fragment worse than prior x-ray on 2/1. I spoke to the orthopedist on-call with [facility] orthopedics [phone]) who has reviewed the images. He advised patient needs to follow-up with Dr. Ninfa Linden on Monday. Patient's arm is neurovascularly intact. He is hemodynamically stable. On 4 L of oxygen via nasal cannula which is his most recent baseline and consistent with his discharge from the hospital earlier today. Patient will be discharged back to the SNF. His wife is here and was admitted on the plan of care. She asked if we could keep him in the hospital until he can be seen by the orthopedist on Monday. I advised at this time, his fracture does not meet criteria for admission. She is just frustrated that he will have to be transported back and forth via ambulance. She is in agreement with plan. Pt discharged in stable condition.  Final Clinical Impressions(s) / ED Diagnoses   Final diagnoses:  Injury of right shoulder, initial encounter    New Prescriptions New Prescriptions   No medications on file     Seth James, MD 08/09/16 [place], MD 08/10/16 [phone]

## 2016-08-10 ENCOUNTER — Encounter (HOSPITAL_COMMUNITY): Payer: Self-pay | Admitting: Gastroenterology

## 2016-08-11 ENCOUNTER — Encounter: Payer: Self-pay | Admitting: Adult Health

## 2016-08-11 ENCOUNTER — Non-Acute Institutional Stay (SKILLED_NURSING_FACILITY): Payer: Medicare Other | Admitting: Adult Health

## 2016-08-11 DIAGNOSIS — S42201S Unspecified fracture of upper end of right humerus, sequela: Secondary | ICD-10-CM | POA: Diagnosis not present

## 2016-08-11 DIAGNOSIS — I482 Chronic atrial fibrillation, unspecified: Secondary | ICD-10-CM

## 2016-08-11 DIAGNOSIS — J90 Pleural effusion, not elsewhere classified: Secondary | ICD-10-CM | POA: Diagnosis not present

## 2016-08-11 DIAGNOSIS — N183 Chronic kidney disease, stage 3 unspecified: Secondary | ICD-10-CM

## 2016-08-11 DIAGNOSIS — R1310 Dysphagia, unspecified: Secondary | ICD-10-CM

## 2016-08-11 DIAGNOSIS — I2510 Atherosclerotic heart disease of native coronary artery without angina pectoris: Secondary | ICD-10-CM

## 2016-08-11 DIAGNOSIS — I5032 Chronic diastolic (congestive) heart failure: Secondary | ICD-10-CM

## 2016-08-11 DIAGNOSIS — J9611 Chronic respiratory failure with hypoxia: Secondary | ICD-10-CM | POA: Diagnosis not present

## 2016-08-11 DIAGNOSIS — K250 Acute gastric ulcer with hemorrhage: Secondary | ICD-10-CM

## 2016-08-11 DIAGNOSIS — G309 Alzheimer's disease, unspecified: Secondary | ICD-10-CM

## 2016-08-11 DIAGNOSIS — I10 Essential (primary) hypertension: Secondary | ICD-10-CM

## 2016-08-11 DIAGNOSIS — D500 Iron deficiency anemia secondary to blood loss (chronic): Secondary | ICD-10-CM

## 2016-08-11 DIAGNOSIS — R5381 Other malaise: Secondary | ICD-10-CM

## 2016-08-11 DIAGNOSIS — T8189XS Other complications of procedures, not elsewhere classified, sequela: Secondary | ICD-10-CM

## 2016-08-11 DIAGNOSIS — F0280 Dementia in other diseases classified elsewhere without behavioral disturbance: Secondary | ICD-10-CM

## 2016-08-11 DIAGNOSIS — E785 Hyperlipidemia, unspecified: Secondary | ICD-10-CM

## 2016-08-11 DIAGNOSIS — R627 Adult failure to thrive: Secondary | ICD-10-CM

## 2016-08-11 DIAGNOSIS — K5901 Slow transit constipation: Secondary | ICD-10-CM

## 2016-08-11 DIAGNOSIS — E1151 Type 2 diabetes mellitus with diabetic peripheral angiopathy without gangrene: Secondary | ICD-10-CM

## 2016-08-11 DIAGNOSIS — IMO0002 Reserved for concepts with insufficient information to code with codable children: Secondary | ICD-10-CM

## 2016-08-11 DIAGNOSIS — E1165 Type 2 diabetes mellitus with hyperglycemia: Secondary | ICD-10-CM

## 2016-08-11 NOTE — Progress Notes (Signed)
DATE:  08/11/2016   MRN:  [name]  BIRTHDAY: 09-29-1934  Facility:  Nursing Home Location:  [place] Room Number: 308-P  LEVEL OF CARE:  SNF [phone])  Contact Information    Name Relation Home Work [place] Spouse [phone]  [phone]   Sparks,Cheryl Daughter   [phone]   Salli Quarry Daughter   [phone]       Code Status History    Date Active Date Inactive Code Status Order ID Comments User Context   07/25/2016 12:36 AM 08/09/2016  2:12 PM Full Code [id number]  Norval Morton, MD ED   07/03/2016  6:35 PM 07/22/2016  1:34 AM Full Code [id number]  Jonetta Osgood, MD ED   06/06/2016  6:44 PM 06/08/2016  3:11 AM Full Code [id number]  Thompson Grayer, MD Inpatient   06/06/2016  6:43 PM 06/06/2016  6:44 PM Full Code [id number]  Thompson Grayer, MD Inpatient   06/02/2014  6:36 PM 06/07/2014  4:52 PM Full Code [id number]  Charlynne Cousins, MD Inpatient       Chief Complaint  Patient presents with  . Hospitalization Follow-up    HISTORY OF PRESENT ILLNESS:  This is a 82-YO male seen for hospital follow-up.  He was readmitted to [facility] on 08/09/2016 for short-term rehabilitation following an admission at [facility] 07/24/2016-08/08/2016 for a fall at [facility] resulting in a right humerus fracture. He was given oral vitamin K for elevated INR and was transfused 1 unit PRBC. He required a total of 4 units PRBC during hospitalization.  Of note, he was having a short-term rehabilitation @ [facility] following an admission at [facility] 07/03/2016-07/21/2016 for hypoxia.  He was found to have acute on chronic diastolic heart failure and was started on diuretics. He was found to have right pleural effusion and had thoracentesis on 07/04/16 with removal of 1.8L of yellow fluid. A repeat CXR showed improvement. He was treated for groin candida dermatitis with fluconazole and nystatin powder. He has atrial fibrillation and  was restarted on Coumadin.    PAST MEDICAL HISTORY:  Past Medical History:  Diagnosis Date  . Aortal stenosis 2006   biophrostetic  . Arthritis   . Atrial fibrillation ([place])    onset after CABG--- coumadin management at Coumadin clinic  . Carotid artery disease ([place]) 09/27/2010   Carotid US 12/17: bilat ICA 40-59 >> 1 year FU  . CHF (congestive heart failure) ([place])    abter CABG  . Complete heart block ([place])   . Coronary artery disease   . DM type 2 (diabetes mellitus, type 2) ([place])    used to see Dr Meredith Pel, now f/u by Dr Larose Kells  . Erectile dysfunction   . Gangrene ([place])   . History of nephrolithiasis   . HTN (hypertension)   . Hyperlipidemia   . Memory loss   . Obesity   . OSA (obstructive sleep apnea)    not using CPAP  . Pacemaker 2/07   VVI  . PVD (peripheral vascular disease) ([name])    CT angio showed- vascular insuff, intestine and RAS bilaterally, andgiogram 02/2009 severe PVD medical management  . S/P aortic valve replacement   . Sick sinus syndrome ([name])    [name]  . Skin cancer    surgery (nose) 09-2010     CURRENT MEDICATIONS: Reviewed  Patient's Medications  New Prescriptions   No medications on file  Previous Medications   ACETAMINOPHEN (TYLENOL)  325 MG TABLET    Take 2 tablets (650 mg total) by mouth every 6 (six) hours as needed for mild pain (or Fever >/= 101).   ALBUTEROL (PROVENTIL) (2.5 MG/3ML) 0.083% NEBULIZER SOLUTION    Take 3 mLs (2.5 mg total) by nebulization every 4 (four) hours as needed for wheezing or shortness of breath.   ASPIRIN EC 81 MG EC TABLET    Take 1 tablet (81 mg total) by mouth daily.   DOCUSATE SODIUM (COLACE) 100 MG CAPSULE    Take 1 capsule (100 mg total) by mouth 2 (two) times daily.   DONEPEZIL (ARICEPT) 10 MG TABLET    Take 1 tablet (10 mg total) by mouth at bedtime.   EZETIMIBE-SIMVASTATIN (VYTORIN) 10-40 MG PER TABLET    Take 1 tablet by mouth at bedtime.   FERROUS SULFATE 325 (65 FE) MG TABLET    Take 1  tablet (325 mg total) by mouth 2 (two) times daily with a meal.   FUROSEMIDE (LASIX) 40 MG TABLET    Take 1 tablet (40 mg total) by mouth 2 (two) times daily.   INSULIN GLARGINE (LANTUS) 100 UNIT/ML INJECTION    Inject 0.05 mLs (5 Units total) into the skin at bedtime.   INSULIN LISPRO (HUMALOG) 100 UNIT/ML INJECTION    Inject 0-9 Units into the skin 3 (three) times daily with meals. <70 initiate hypoglycemic protocol, 70-120 = 0 units, 121-150 = 1 unit, 151-200 = 2 units, 201-250 = 3 units, 251-300 = 5 units, 301-350 = 7 units, 351-400 = 9 units, >400 call MD/NP   IPRATROPIUM (ATROVENT) 0.02 % NEBULIZER SOLUTION    Take 2.5 mLs (0.5 mg total) by nebulization every 4 (four) hours as needed for wheezing or shortness of breath.   METOPROLOL TARTRATE (LOPRESSOR) 25 MG TABLET    Take 1 tablet (25 mg total) by mouth 2 (two) times daily.   MULTIPLE VITAMINS-MINERALS (DECUBI-VITE) CAPS    Take 1 capsule by mouth daily.   NYSTATIN (MYCOSTATIN/NYSTOP) POWDER    Apply topically 3 (three) times daily. Apply to groin daily   PANTOPRAZOLE (PROTONIX) 40 MG TABLET    Take 1 tablet (40 mg total) by mouth 2 (two) times daily.   SACCHAROMYCES BOULARDII (FLORASTOR) 250 MG CAPSULE    Take 1 capsule (250 mg total) by mouth 2 (two) times daily.   WARFARIN (COUMADIN) 3 MG TABLET    Take 1 tablet (3 mg total) by mouth daily at 6 PM.  Modified Medications   No medications on file  Discontinued Medications   INSULIN ASPART (NOVOLOG) 100 UNIT/ML INJECTION    Inject 0-9 Units into the skin 3 (three) times daily with meals. Sliding scale insulin Less than 70 initiate hypoglycemia protocol 70-120  0 units 120-150 1 unit 151-200 2 units 201-250 3 units 251-300 5 units 301-350 7 units 351-400 9 units  Greater than 400 call MD     Allergies  Allergen Reactions  . Potassium-Containing Compounds Anaphylaxis    IV--loss of conciousness  . Metoprolol Succinate Other (See Comments)    Extremely tired     REVIEW OF  SYSTEMS:  GENERAL: no change in appetite, no fatigue, no weight changes, no fever, chills or weakness EYES: Denies change in vision, dry eyes, eye pain, itching or discharge EARS: Denies change in hearing, ringing in ears, or earache NOSE: Denies nasal congestion or epistaxis MOUTH and THROAT: Denies oral discomfort, gingival pain or bleeding, pain from teeth or hoarseness   RESPIRATORY: no cough, SOB,  DOE, wheezing, hemoptysis CARDIAC: no chest pain, edema or palpitations GI: no abdominal pain, diarrhea, constipation, heart burn, nausea or vomiting GU: Denies dysuria, frequency, hematuria, incontinence, or discharge PSYCHIATRIC: Denies feeling of depression or anxiety. No report of hallucinations, insomnia, paranoia, or agitation    PHYSICAL EXAMINATION  GENERAL APPEARANCE: Well nourished. In no acute distress. Normal body habitus SKIN:  Right heel has DTI 2.8 X 4 cm, Left hallux open wound (S/P great toe amputation) 5.8 X 1.5 cm undermining 10-11 o'clock with wound vac HEAD: Normal in size and contour. No evidence of trauma EYES: Lids open and close normally. No blepharitis, entropion or ectropion. PERRL. Conjunctivae are clear and sclerae are white. Lenses are without opacity EARS: Pinnae are normal. Patient hears normal voice tunes of the examiner MOUTH and THROAT: Lips are without lesions. Oral mucosa is moist and without lesions. Tongue is normal in shape, size, and color and without lesions NECK: supple, trachea midline, no neck masses, no thyroid tenderness, no thyromegaly LYMPHATICS: no LAN in the neck, no supraclavicular LAN RESPIRATORY: breathing is even & unlabored, BS CTAB CARDIAC: RRR, no murmur,no extra heart sounds, no edema GI: abdomen soft, normal BS, no masses, no tenderness, no hepatomegaly, no splenomegaly EXTREMITIES:  Able to move X 4 extremities; right arm on sling PSYCHIATRIC: Alert to self, disoriented to time and place. Affect and behavior are  appropriate   LABS/RADIOLOGY: Labs reviewed: Basic Metabolic Panel:  Recent Labs  07/30/16 1352  08/04/16 1032  08/07/16 0407 08/08/16 0308 08/08/16 2300  NA  --   < > 137  < > 138 138 135  K  --   < > 4.4  < > 4.5 4.3 4.1  CL  --   < > 98*  < > 100* 100* 96*  CO2  --   < > 32  < > 33* 33* 33*  GLUCOSE  --   < > 172*  < > 183* 172* 143*  BUN  --   < > 29*  < > 39* 38* 38*  CREATININE  --   < > 0.89  < > 0.99 1.06 1.09  CALCIUM  --   < > 9.4  < > 9.9 9.7 9.6  MG 1.9  --  1.8  --  1.8  --   --   < > = values in this interval not displayed. Liver Function Tests:  Recent Labs  07/04/16 0502 07/24/16 2149 08/08/16 2300  AST 29 50* 53*  ALT 25 17 34  ALKPHOS 129* 134* 174*  BILITOT 0.8 0.4 0.8  PROT 6.8 6.3* 6.1*  ALBUMIN 2.5* 2.1* 1.9*   CBC:  Recent Labs  07/26/16 0414 07/27/16 0150 07/28/16 0302  08/07/16 0407 08/08/16 0308 08/08/16 0804 08/08/16 2300  WBC 10.6* 10.4 11.8*  < > 14.7* 10.2  --  9.5  NEUTROABS 8.1* 7.8* 9.4*  --   --   --   --   --   HGB 8.9* 8.3* 8.0*  < > 9.0* 8.2* 8.7* 8.0*  HCT 29.1* 27.3* 26.2*  < > 30.1* 28.5* 29.7* 26.7*  MCV 89.0 89.5 90.3  < > 90.7 91.3  --  90.5  PLT 326 331 308  < > 290 262  --  260  < > = values in this interval not displayed. Lipid Panel:  Recent Labs  03/19/16 1534  HDL 36*   Cardiac Enzymes:  Recent Labs  08/08/16 2300 08/09/16 0546  CKTOTAL 11*  --   CKMB  3.4  --   TROPONINI 0.06* 0.06*   CBG:  Recent Labs  08/08/16 1610 08/08/16 1646 08/08/16 2235  GLUCAP 187* 195* 140*      Dg Chest 1 View  Result Date: 08/07/2016 CLINICAL DATA:  Post right thoracentesis EXAM: CHEST 1 VIEW COMPARISON:  08/06/2016 FINDINGS: Significant decrease in right pleural effusion following thoracentesis. Small remaining right pleural effusion. No pneumothorax. Bilateral airspace disease and vascular congestion most compatible with pulmonary edema which has progressed in the interval. Single lead transvenous  pacemaker noted. Prior CABG and aortic valve replacement IMPRESSION: Decreased right pleural effusion following thoracentesis. No pneumothorax Progression of airspace disease consistent with edema. Electronically Signed   By: Franchot Gallo M.D.   On: 08/07/2016 16:22   Dg Chest 1 View  Result Date: 07/28/2016 CLINICAL DATA:  82 year old male status post right chest ultrasound-guided thoracentesis today. Initial encounter. EXAM: CHEST 1 VIEW COMPARISON:  07/27/2016 and earlier. FINDINGS: AP semi upright view at 1513 hours. Decreased right pleural effusion with no pneumothorax. Small volume residual including some pleural fluid in the minor fissure. Improved right mid and lower lung ventilation. Stable cardiomegaly and mediastinal contours. Stable left lung. IMPRESSION: Decreased right pleural effusion and increased right lung ventilation with no pneumothorax. Electronically Signed   By: Genevie Ann M.D.   On: 07/28/2016 15:32   Dg Chest 1 View  Result Date: 07/24/2016 CLINICAL DATA:  [facility] day nursing facility. EXAM: CHEST 1 VIEW COMPARISON:  07/14/2016 FINDINGS: Mildly comminuted and displaced proximal right humeral fracture. Moderate vascular and interstitial prominence, worsened. Central and basilar airspace opacities likely represent alveolar edema. Moderate right pleural effusion, unchanged or slightly enlarged. Unchanged cardiomegaly. The transvenous cardiac lead appears grossly intact. Prior sternotomy with CABG and aortic valvuloplasty. IMPRESSION: Interstitial and vascular changes, as well as central airspace opacities, consistent with congestive heart failure. Moderate right pleural effusion. Electronically Signed   By: Andreas Newport M.D.   On: 07/24/2016 21:06   Dg Chest 2 View  Result Date: 07/14/2016 CLINICAL DATA:  Shortness of breath. EXAM: CHEST  2 VIEW COMPARISON:  07/12/2016 FINDINGS: The heart is mildly enlarged but stable. Stable right ventricular pacer wire. The lungs  demonstrate much improved aeration with resolving pulmonary edema. There is a persistent right-sided pleural effusion and overlying atelectasis. IMPRESSION: Resolving pulmonary edema. Persistent small right pleural effusion and overlying atelectasis. Electronically Signed   By: Marijo Sanes M.D.   On: 07/14/2016 12:06   Dg Shoulder Right  Result Date: 08/09/2016 CLINICAL DATA:  Acute onset of right arm pain, with right shoulder deformity. Recently diagnosed right humeral neck fracture. Subsequent encounter. EXAM: RIGHT SHOULDER - 2+ VIEW COMPARISON:  Right shoulder radiographs performed 07/24/2016 FINDINGS: There is superior subluxation of the right humeral head, with mildly increased widening of the right acromioclavicular joint to 4 cm. There is also increased anterior displacement of the distal humerus, measuring approximately 1 shaft width at the level of the humeral neck fracture. Overlying soft tissue swelling is noted. No additional fractures are identified. There appears to be some degree of winging of the scapula. Diffuse vascular calcifications are seen. IMPRESSION: 1. Increased anterior displacement of the distal humerus, measuring approximately 1 shaft width at the level of the patient's right humeral neck fracture. 2. Superior subluxation of the right humeral head, with mildly increased widening of the right acromioclavicular joint to 4 cm. 3. Diffuse vascular calcifications seen. Electronically Signed   By: Garald Balding M.D.   On: 08/09/2016 20:49  Dg Shoulder Right  Result Date: 07/24/2016 CLINICAL DATA:  Fall with right arm pain. Humerus fracture on outside radiograph. EXAM: RIGHT SHOULDER - 2+ VIEW COMPARISON:  None. FINDINGS: Oblique fracture of the humeral neck is mildly comminuted. There is 18 mm lateral and mild anterior displacement of the humeral diaphysis. No evidence of extension to the glenohumeral joint. Chronic deformity of the right acromioclavicular joint fragmentation.  Vascular calcifications are seen. IMPRESSION: Displaced mildly comminuted humeral neck fracture. No evidence of intra-articular extension. Electronically Signed   By: Jeb Levering M.D.   On: 07/24/2016 21:07   Ct Head Wo Contrast  Result Date: 07/24/2016 CLINICAL DATA:  Status post fall. EXAM: CT HEAD WITHOUT CONTRAST CT CERVICAL SPINE WITHOUT CONTRAST TECHNIQUE: Multidetector CT imaging of the head and cervical spine was performed following the standard protocol without intravenous contrast. Multiplanar CT image reconstructions of the cervical spine were also generated. COMPARISON:  Head CT dated 01/08/2011. FINDINGS: CT HEAD FINDINGS Brain: There is generalized age related parenchymal atrophy with commensurate dilatation of the ventricles and sulci. Mild chronic small vessel ischemic changes noted within the deep periventricular white matter regions bilaterally. There is no mass, hemorrhage, edema or other evidence of acute parenchymal abnormality. No extra-axial hemorrhage. Vascular: There are chronic calcified atherosclerotic changes of the large vessels at the skull base. No unexpected hyperdense vessel. Skull: Normal. Negative for fracture or focal lesion. Sinuses/Orbits: No acute finding. Other: None. CT CERVICAL SPINE FINDINGS Alignment: Slightly accentuated kyphosis of the lower cervical spine related to underlying degenerative change. No evidence of acute subluxation. Skull base and vertebrae: No fracture line or displaced fracture fragment identified. Soft tissues and spinal canal: No prevertebral fluid or swelling. No visible canal hematoma. Disc levels: Degenerative change within the mid and lower cervical spine, mild to moderate in degree, with associated mild disc space narrowings and osseous spurring. No more than mild central canal stenosis at any level. Upper chest: Large right pleural effusion extending to the right lung apex, with probable adjacent atelectasis. Other: Heavy atherosclerotic  calcifications at each carotid bulb region and at each carotid origin. IMPRESSION: 1. No acute intracranial abnormality. No intracranial mass, hemorrhage or edema. No skull fracture. Atrophy and chronic ischemic changes in the white matter. 2. No fracture or acute subluxation within the cervical spine. Degenerative/chronic findings of the cervical spine, as detailed above. 3. Large right pleural effusion, extending to the right lung apex, with probable adjacent atelectasis. 4. Carotid atherosclerosis. Electronically Signed   By: Franki Cabot M.D.   On: 07/24/2016 21:42   Ct Cervical Spine Wo Contrast  Result Date: 07/24/2016 CLINICAL DATA:  Status post fall. EXAM: CT HEAD WITHOUT CONTRAST CT CERVICAL SPINE WITHOUT CONTRAST TECHNIQUE: Multidetector CT imaging of the head and cervical spine was performed following the standard protocol without intravenous contrast. Multiplanar CT image reconstructions of the cervical spine were also generated. COMPARISON:  Head CT dated 01/08/2011. FINDINGS: CT HEAD FINDINGS Brain: There is generalized age related parenchymal atrophy with commensurate dilatation of the ventricles and sulci. Mild chronic small vessel ischemic changes noted within the deep periventricular white matter regions bilaterally. There is no mass, hemorrhage, edema or other evidence of acute parenchymal abnormality. No extra-axial hemorrhage. Vascular: There are chronic calcified atherosclerotic changes of the large vessels at the skull base. No unexpected hyperdense vessel. Skull: Normal. Negative for fracture or focal lesion. Sinuses/Orbits: No acute finding. Other: None. CT CERVICAL SPINE FINDINGS Alignment: Slightly accentuated kyphosis of the lower cervical spine related to underlying degenerative change.  No evidence of acute subluxation. Skull base and vertebrae: No fracture line or displaced fracture fragment identified. Soft tissues and spinal canal: No prevertebral fluid or swelling. No visible  canal hematoma. Disc levels: Degenerative change within the mid and lower cervical spine, mild to moderate in degree, with associated mild disc space narrowings and osseous spurring. No more than mild central canal stenosis at any level. Upper chest: Large right pleural effusion extending to the right lung apex, with probable adjacent atelectasis. Other: Heavy atherosclerotic calcifications at each carotid bulb region and at each carotid origin. IMPRESSION: 1. No acute intracranial abnormality. No intracranial mass, hemorrhage or edema. No skull fracture. Atrophy and chronic ischemic changes in the white matter. 2. No fracture or acute subluxation within the cervical spine. Degenerative/chronic findings of the cervical spine, as detailed above. 3. Large right pleural effusion, extending to the right lung apex, with probable adjacent atelectasis. 4. Carotid atherosclerosis. Electronically Signed   By: Franki Cabot M.D.   On: 07/24/2016 21:42   Dg Chest Port 1 View  Result Date: 08/06/2016 CLINICAL DATA:  Hypoxia. EXAM: PORTABLE CHEST 1 VIEW COMPARISON:  Seven days prior FINDINGS: Progressive haziness of the right chest with the appearance of layering pleural effusion, moderate or large. Better aerated left chest. Status post CABG and aortic valve replacement. Single chamber pacer from the left in stable position. No pulmonary edema or pneumothorax noted. IMPRESSION: 1. Progressed right pleural effusion that is moderate to large. 2. Cardiomegaly. 3. No pulmonary edema. Electronically Signed   By: Monte Fantasia M.D.   On: 08/06/2016 09:37   Dg Chest Port 1 View  Result Date: 07/30/2016 CLINICAL DATA:  Recurrent RIGHT pleural effusion. EXAM: PORTABLE CHEST 1 VIEW COMPARISON:  07/28/2016 FINDINGS: Sternotomy wires overlie normal cardiac silhouette. Lung bases are poorly evaluated. Fine pulmonary edema pattern similar to comparison exam. No pneumothorax. IMPRESSION: Cardiomegaly and mild pulmonary edema.  No  significant change. Lung bases are poorly defined to body habitus and overlying soft tissue Electronically Signed   By: Suzy Bouchard M.D.   On: 07/30/2016 13:59   Dg Chest Port 1 View  Result Date: 07/27/2016 CLINICAL DATA:  Hypoxia. EXAM: PORTABLE CHEST 1 VIEW COMPARISON:  July 25, 2016 FINDINGS: The layering right pleural effusion is similar to mildly larger in the interval. There is probably a tiny left effusion. Stable cardiomediastinal silhouette. Increasing bilateral pulmonary opacities, right greater than left, likely mildly worsened edema. No other change. IMPRESSION: Moderate to large a layering effusion on the right, similar to mildly worsened in the interval. Increasing pulmonary edema. Tiny left effusion. Electronically Signed   By: Dorise Bullion III M.D   On: 07/27/2016 07:45   Dg Chest Port 1 View  Result Date: 07/25/2016 CLINICAL DATA:  Respiratory difficulty EXAM: PORTABLE CHEST 1 VIEW COMPARISON:  Earlier today FINDINGS: Moderate to large layering right pleural effusion. There is chronic cardiomegaly. The patient is status post CABG and aortic valve replacement. Single chamber pacer from the left in stable position. Pulmonary venous congestion. IMPRESSION: Stable compared to earlier today. Cardiomegaly and venous congestion with moderate to large layering pleural effusion on the right. Electronically Signed   By: Monte Fantasia M.D.   On: 07/25/2016 20:24   Dg Chest Port 1 View  Result Date: 07/25/2016 CLINICAL DATA:  Dyspnea, desaturation when lying flat EXAM: PORTABLE CHEST 1 VIEW COMPARISON:  Portable exam 1745 hours compared to 07/24/2016 FINDINGS: LEFT subclavian pacemaker lead projects over RIGHT ventricle. Enlargement of cardiac silhouette post CABG and  AVR. Pulmonary vascular congestion. RIGHT pleural effusion and basilar atelectasis. Perihilar edema. No pneumothorax. Bones demineralized. IMPRESSION: Mild pulmonary edema with persistent RIGHT pleural effusion and basilar  atelectasis. Electronically Signed   By: Lavonia Dana M.D.   On: 07/25/2016 18:09   US Thoracentesis Asp Pleural Space W/img Guide  Result Date: 08/07/2016 INDICATION: Shortness of breath. Large right pleural effusion. Request therapeutic thoracentesis. EXAM: ULTRASOUND GUIDED RIGHT THORACENTESIS MEDICATIONS: None. COMPLICATIONS: None immediate. PROCEDURE: An ultrasound guided thoracentesis was thoroughly discussed with the patient and questions answered. The benefits, risks, alternatives and complications were also discussed. The patient understands and wishes to proceed with the procedure. Written consent was obtained. Ultrasound was performed to localize and mark an adequate pocket of fluid in the right chest. The area was then prepped and draped in the normal sterile fashion. 1% Lidocaine was used for local anesthesia. Under ultrasound guidance a Safe-T-Centesis catheter was introduced. Thoracentesis was performed. The catheter was removed and a dressing applied. FINDINGS: A total of approximately 2.3 L of clear, amber colored fluid was removed. IMPRESSION: Successful ultrasound guided right thoracentesis yielding 2.3 L of pleural fluid. Read by: Ascencion Dike PA-C Electronically Signed   By: Aletta Edouard M.D.   On: 08/07/2016 16:20   US Thoracentesis Asp Pleural Space W/img Guide  Result Date: 07/28/2016 INDICATION: Patient with hypoxic respiratory failure and right pleural effusion. Request is made for diagnostic and therapeutic thoracentesis. EXAM: ULTRASOUND GUIDED DIAGNOSTIC AND THERAPEUTIC RIGHT THORACENTESIS MEDICATIONS: 10 mL 1% lidocaine COMPLICATIONS: None immediate. PROCEDURE: An ultrasound guided thoracentesis was thoroughly discussed with the patient and questions answered. The benefits, risks, alternatives and complications were also discussed. The patient understands and wishes to proceed with the procedure. Written consent was obtained. Ultrasound was performed to localize and mark an  adequate pocket of fluid in the right chest. The area was then prepped and draped in the normal sterile fashion. 1% Lidocaine was used for local anesthesia. Under ultrasound guidance a Safe-T-centesis catheter was introduced. Thoracentesis was performed. The catheter was removed and a dressing applied. FINDINGS: A total of approximately 2.1 liters of clear, yellow fluid was removed. Samples were sent to the laboratory as requested by the clinical team. IMPRESSION: Successful ultrasound guided diagnostic and therapeutic right thoracentesis yielding 2.1 liters of pleural fluid. Read by:  Brynda Greathouse PA-C Electronically Signed   By: Corrie Mckusick D.O.   On: 07/28/2016 15:34    ASSESSMENT/PLAN:  Physical deconditioning - for rehabilitation, PT and OT, for therapeutic strengthening exercises; fall precautions  Right humeral neck fracture - x-ray of right shoulder confirmed displaced mildly comminuted humeral neck fracture; continue right shoulder sling; orthopedic was consulted and determined that it can be treated with just a sling for immobilization; follow-up with Dr. Meridee Score, orthopedic surgeon, in 1 ; physiatry consult  Acute on chronic anemia - secondary to upper GI bleed from gastric ulcer; required transfusion of 4 units packed RBC in the hospital; check CBC; continue ferrous sulfate 325 mg 1 tab by mouth twice a day Lab Results  Component Value Date   HGB 8.0 (L) 08/08/2016   Acute gastric ulcer with hemorrhage - S/P EGD which showed 2 oozing superficial gastric ulcer S/P clip placement for hemostasis, cleared by GI to resume warfarin; continue Protonix 40 mg BID  Chronic atrial fibrillation - rate controlled; metoprolol started by cardiology for NSVT; cleared by GI to resume warfarin  Right-sided pleural effusion, recurrent - S/P thoracentesis with removal of 2.1 L on 2/5 and repeat thoracentesis  on 2/15 with 2.1 L drained; repeat chest x-ray in 1 week  Chronic hypoxic respiratory  failure - and tinea O2 at 2 L/minute via [place] continuously, continue albuterol when necessary and ipratropium bromide when necessary  Dysphagia - for ST evaluation and treatment for swallowing functions; aspiration precaution  Chronic kidney disease, stage III -  Check BMP Lab Results  Component Value Date   CREATININE 1.09 08/08/2016   Chronic diastolic CHF - continue Lasix 40 mg 1 tab by mouth twice a day and metoprolol tartrate 25 mg 1 tab by mouth twice a day; weigh every Mondays-Wednesdays-Fridays and notify provider for +- 3 lbs in a day/week  CAD - S/ CABG , stable; continue aspirin EC 81 mg 1 tab by mouth daily  Diabetes mellitus, type II - continue Lantus 100 units/mL give 5 units subcutaneous daily at bedtime,  Humalog sliding scale subcutaneous TID and CBG before meals at bedtime  Lab Results  Component Value Date   HGBA1C 7.4 (H) 07/06/2016   Essential hypertension - continue metoprolol 25 mg 1 tab by mouth twice a day; lisinopril was discontinued in the hospital  Non-healing wound - S/P angioplasty and stenting LLE and S/P left great toe amputation; underwent excisional debridement on 2/6 and repeat I&D on 2/8, then had wound vac placement; follow-up with vascular surgery on 08/15/16; continue decubivite 1 tab PO Q D  Alzheimer's Dementia - continue Aricept 10 mg 1 tab by mouth daily at bedtime  Adult failure to thrive - has been having progressive decline; will have Palliative Care consult with [facility]  Hyperlipidemia - continue Vytorin 10-14 milligrams 1 tab by mouth daily at bedtime  Constipation - continue Colace 100 mg 1 capsule by mouth twice a day  Hypomagnesemia - was repleted in the hospital; check Mg level    Goals of care:  Short-term rehabilitation   Anakaren Campion C. [place] - NP    [employer] [phone]

## 2016-08-12 ENCOUNTER — Encounter: Payer: Self-pay | Admitting: Family

## 2016-08-14 ENCOUNTER — Telehealth: Payer: Self-pay | Admitting: Surgery

## 2016-08-14 DIAGNOSIS — K5900 Constipation, unspecified: Secondary | ICD-10-CM | POA: Diagnosis not present

## 2016-08-14 NOTE — Telephone Encounter (Signed)
I called the patient's wife to confirm his appointment for March 5 with [facility]. His wife states that he is located at [facility] and may need an ambulance to transport him there since he recently broke his shoulder. The wife states she will contact the nursing facility but feels he may not be able to come to his appointments scheduled for tomorrow (wound check ) or the visit with Dr. Trula Slade on March 5 because it costs too much for her to have her husband transported by ambulance. She may call back today to see about changing the appointments.

## 2016-08-14 NOTE — Telephone Encounter (Signed)
[facility] has ways to get him here; they can use P[facility] for transport. This family always calls and says that he can't come in for appts. Please contact [facility] and let them know what the wife said. Thanks

## 2016-08-14 NOTE — Telephone Encounter (Signed)
I left a message for transportation at [facility] to make sure these appointments are on the books.

## 2016-08-15 ENCOUNTER — Ambulatory Visit (INDEPENDENT_AMBULATORY_CARE_PROVIDER_SITE_OTHER): Payer: Self-pay | Admitting: Family

## 2016-08-15 ENCOUNTER — Encounter: Payer: Self-pay | Admitting: Family

## 2016-08-15 VITALS — BP 98/51 | HR 60 | Temp 97.0°F | Resp 20 | Ht 70.0 in | Wt 175.0 lb

## 2016-08-15 DIAGNOSIS — E1165 Type 2 diabetes mellitus with hyperglycemia: Secondary | ICD-10-CM

## 2016-08-15 DIAGNOSIS — M86172 Other acute osteomyelitis, left ankle and foot: Secondary | ICD-10-CM

## 2016-08-15 DIAGNOSIS — IMO0002 Reserved for concepts with insufficient information to code with codable children: Secondary | ICD-10-CM

## 2016-08-15 DIAGNOSIS — I779 Disorder of arteries and arterioles, unspecified: Secondary | ICD-10-CM

## 2016-08-15 DIAGNOSIS — Z87891 Personal history of nicotine dependence: Secondary | ICD-10-CM

## 2016-08-15 DIAGNOSIS — E1151 Type 2 diabetes mellitus with diabetic peripheral angiopathy without gangrene: Secondary | ICD-10-CM

## 2016-08-15 NOTE — Patient Instructions (Signed)
Pad  Peripheral Vascular Disease Peripheral vascular disease (PVD) is a disease of the blood vessels that are not part of your heart and brain. A simple term for PVD is poor circulation. In most cases, PVD narrows the blood vessels that carry blood from your heart to the rest of your body. This can result in a decreased supply of blood to your arms, legs, and internal organs, like your stomach or kidneys. However, it most often affects a person's lower legs and feet. There are two types of PVD.  Organic PVD. This is the more common type. It is caused by damage to the structure of blood vessels.  Functional PVD. This is caused by conditions that make blood vessels contract and tighten (spasm). Without treatment, PVD tends to get worse over time. PVD can also lead to acute ischemic limb. This is when an arm or limb suddenly has trouble getting enough blood. This is a medical emergency. Follow these instructions at home:  Take medicines only as told by your doctor.  Do not use any tobacco products, including cigarettes, chewing tobacco, or electronic cigarettes. If you need help quitting, ask your doctor.  Lose weight if you are overweight, and maintain a healthy weight as told by your doctor.  Eat a diet that is low in fat and cholesterol. If you need help, ask your doctor.  Exercise regularly. Ask your doctor for some good activities for you.  Take good care of your feet.  Wear comfortable shoes that fit well.  Check your feet often for any cuts or sores. Contact a doctor if:  You have cramps in your legs while walking.  You have leg pain when you are at rest.  You have coldness in a leg or foot.  Your skin changes.  You are unable to get or have an erection (erectile dysfunction).  You have cuts or sores on your feet that are not healing. Get help right away if:  Your arm or leg turns cold and blue.  Your arms or legs become red, warm, swollen, painful, or numb.  You  have chest pain or trouble breathing.  You suddenly have weakness in your face, arm, or leg.  You become very confused or you cannot speak.  You suddenly have a very bad headache.  You suddenly cannot see. This information is not intended to replace advice given to you by your health care provider. Make sure you discuss any questions you have with your health care provider. Document Released: 09/03/2009 Document Revised: 11/15/2015 Document Reviewed: 11/17/2013 Elsevier Interactive Patient Education  2017 [employer].

## 2016-08-15 NOTE — Progress Notes (Signed)
Postoperative Visit   History of Present Illness  [name]. is a 82 y.o. male who is s/p I&D left great toe, including skin, soft tissue and bone and application of wound vac on 07-31-16 by Dr. Trula Slade for non-healing left great toe amputation site. The patient is s/p percutaneous revascularization and toe amputation with a non healing wound.     On 07-16-16 he underwent  1.  US guided cannulation of right common femoral artery 2.  Left lower extremity angiogram 3.  US guided cannulation of left anterior tibial artery 4.  CSI atherectomy of left anterior tibial artery 5.  Balloon angioplasty of left anterior tibial artery with 74mm balloon 6.  Drug coated balloon angioplasty of left sfa in-stent restenosis with 98mm impact admiral 7.  Resection 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 head and sesmoid bone For critical left lower extremity ischemia with osteomyelitis, performed by Dr. Donzetta Matters. Pt has a history of a previous left SFA stenting and first toe amputation. He then had osteomyelitis in his metatarsal head and sesamoid bones with evidence of in-stent restenosis as well as occlusion of his anterior tibial artery.  He does have runoff to the ankle via a peroneal artery. Findings: Iliofemoral segments on the left side were patent. Left SFA was noted to be diffusely stenotic with in-stent stenosis 60%. Popliteal artery was patent as was the peroneal artery to the level of the ankle. The anterior tibial artery had a mid segment occlusion and distally had stenoses greater than 60%. Following intervention with atherectomy and balloon angioplasty of the anterior tibial artery there was 0% residual stenosis with no flow limiting dissection and filling of the dorsalis pedis artery. Flow through the in-stent restenosis was reduced to less than 20%.  He returns today for feet wounds check.  Wound vac has been removed for examination of wound, to be resumed. It appears that [facility] is  packing silver alginate into the left foot open wound.  Pt states he has no pain in his feet.   Pt is sliding out of his wheelchair, was helped back up by 4 staff members several times, he states he must slide down as his buttocks hurts.   His serum creatinine was normal (1.09) on 08-08-16. His A1C on 07-06-16 was 7.4.      For VQI Use Only  PRE-ADM LIVING: Nursing home, [place].   AMB STATUS: Wheelchair  Physical Examination  Vitals:   08/15/16 1450  BP: (!) 98/51  Pulse: 60  Resp: 20  Temp: 97 F (36.1 C)  TempSrc: Oral  SpO2: 97%  Weight: 175 lb (79.4 kg)  Height: 5\' 10"  (1.778 m)   Body mass index is 25.11 kg/m.   He has audible Doppler signals at his bilateral DP, PT, and peroneal arteries.  On the lateral aspect of his left foot, base of 5th toe, is shallow dried eschar 2 cm x 4 cm.  Left foot great toe amputation site measures 6 cm x 3 cm, 1 cm deep, 2-3 small thin patches of yellow fibrinous tissue. Right foot heel has a blister.  No erythema no signs of infection, no purulence, no swelling in his feet.   He is wearing a sling on his right arm.   He is oriented to person and place only. He does not know if he can walk or if he has had a stroke.     Medical Decision Making  [name]. is a 82  y.o. male who is s/p I&D left great toe, including skin, soft tissue and bone and application of wound vac on 07-31-16. He is also s/p CSI atherectomy of left anterior tibial artery, balloon angioplasty of left anterior tibial artery with 39mm balloon, drug coated balloon angioplasty of left SFA in-stent restenosis with 71mm impact admiral, and resection 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 head and sesmoid bone on 07-16-16 for critical left lower extremity ischemia with osteomyelitis.  Today Dr. Donzetta Matters debrided a few small patches of fibrinous yellow tissue from left toe amputatuion site wound. Beefy red tissue exposed with bright red blood oozing, good sign of  potential for healing.    Pt has strong Doppler signals in bilateral PT, DP, and peroneal pulses.    Resume wound vac to left great toe amputation site. Keep pressure off pressure ulcer on lateral aspect left foot, and off blister on right heel.  Dr. Donzetta Matters states all of his wounds in his lower extremities should heal if pressure is kept of the blister and ulcer.   Pt is scheduled to return for wound check on 08-25-16 with Dr. Trula Slade, Dr. Donzetta Matters suggested to move this to about 2 weeks from today.    Thank you for allowing [place] to participate in this patient's care.   NICKEL, Sharmon Leyden, RN, MSN, FNP-C [facility] Office: [phone]  08/15/2016, 3:12 PM  Clinic MD: Donzetta Matters

## 2016-08-18 ENCOUNTER — Encounter: Payer: Self-pay | Admitting: Gastroenterology

## 2016-08-19 ENCOUNTER — Encounter (INDEPENDENT_AMBULATORY_CARE_PROVIDER_SITE_OTHER): Payer: Self-pay | Admitting: Orthopaedic Surgery

## 2016-08-19 ENCOUNTER — Encounter: Payer: Self-pay | Admitting: Surgery

## 2016-08-19 ENCOUNTER — Ambulatory Visit (INDEPENDENT_AMBULATORY_CARE_PROVIDER_SITE_OTHER): Payer: Medicare Other | Admitting: Orthopaedic Surgery

## 2016-08-19 DIAGNOSIS — S42211A Unspecified displaced fracture of surgical neck of right humerus, initial encounter for closed fracture: Secondary | ICD-10-CM

## 2016-08-19 DIAGNOSIS — I779 Disorder of arteries and arterioles, unspecified: Secondary | ICD-10-CM

## 2016-08-19 NOTE — Progress Notes (Signed)
Office Visit Note   Patient: [name].           Date of Birth: June 25, 1934           MRN: [name] Visit Date: 08/19/2016              Requested by: Colon Branch, MD [address] [place], [place] [address] PCP: [name], MD   Assessment & Plan: Visit Diagnoses: No diagnosis found.  Plan: X-rays show displaced and angulated proximal humerus fracture without shoulder dislocation. Given patient's current state of health advanced dementia would treat this nonoperatively. He is very low demand and nonambulatory. I recommended 4 weeks of shoulder sling and then wean as tolerated. Follow-up with me as needed. He is not a surgical candidate nor would certainly benefit him.  Follow-Up Instructions: Return if symptoms worsen or fail to improve.   Orders:  No orders of the defined types were placed in this encounter.  No orders of the defined types were placed in this encounter.     Procedures: No procedures performed   Clinical Data: No additional findings.   Subjective: Chief Complaint  Patient presents with  . Right Shoulder - Pain    Patient is a 82 year old gentleman with Alzheimer's who comes in with a right proximal humerus fracture since 08/09/2016. He fell on February 1. He lives at a skilled nursing facility full-time. He is nonambulatory at baseline. He does not complain of pain he has been noncompliant with his sling due to his Alzheimer's. He is here with his wife today. History of present illness details are limited due to Alzheimer's.    Review of Systems  Unable to perform ROS: Dementia     Objective: Vital Signs: There were no vitals taken for this visit.  Physical Exam  Constitutional: He appears well-developed and well-nourished.  HENT:  Head: Normocephalic and atraumatic.  Eyes: Pupils are equal, round, and reactive to light.  Neck: Neck supple.  Pulmonary/Chest: Effort normal.  Abdominal: Soft.  Musculoskeletal: Normal  range of motion.  Skin: Skin is warm.  Nursing note and vitals reviewed.   Ortho Exam Exam of his right shoulder shows a fullness to the anterior aspect of the shoulder presumably from the procrovatum deformity of his fracture. He is neurovascularly intact grossly. Specialty Comments:  No specialty comments available.  Imaging: No results found.   PMFS History: Patient Active Problem List   Diagnosis Date Noted  . Acute gastric ulcer with hemorrhage   . PVC (premature ventricular contraction)   . NSVT (nonsustained ventricular tachycardia) ([place])   . Right humeral fracture 07/25/2016  . Supratherapeutic INR 07/25/2016  . Chronic respiratory failure ([place]) 07/25/2016  . Fall 07/25/2016  . Alzheimer's dementia 07/25/2016  . Fracture of humerus neck, right, closed, initial encounter   . Anemia 07/24/2016  . Wound abscess   . Congestive heart failure ([place])   . S/P thoracentesis   . Malnutrition of moderate degree 07/04/2016  . Pleural effusion 07/04/2016  . Diabetic foot ulcer with osteomyelitis ([place]) 07/03/2016  . Hypoxia 07/03/2016  . Acute diastolic CHF (congestive heart failure) ([place]) 07/03/2016  . Abscess of abdominal wall 06/02/2014  . Abdominal wall abscess 06/02/2014  . Hyponatremia 06/02/2014  . Cellulitis 12/23/2013  . Aftercare following surgery of the circulatory system, [place] 10/05/2013  . Encounter for therapeutic drug monitoring 07/18/2013  . Tingling 11/01/2012  . Hypoxemia 08/04/2012  . Fatigue 01/20/2012  . Memory loss 01/20/2012  .  Chronic diastolic heart failure ([place]) 09/01/2011  . Chronic atrial fibrillation ([place]) 08/21/2011  . Paresthesia 12/16/2010  . Medicare annual wellness visit, initial 10/10/2010  . Carotid artery disease ([place]) 09/27/2010  . Long term (current) use of anticoagulants 09/26/2010  . THYROID NODULE 01/26/2010  . PVD (peripheral vascular disease) ([place]) 01/02/2009  . HIP PAIN, BILATERAL 01/02/2009  . HLD (hyperlipidemia) 08/16/2008    . MYOCARDIAL INFARCTION, HX OF 08/16/2008  . S/P AVR (aortic valve replacement) 08/16/2008  . PERIPHERAL VASCULAR DISEASE WITH CLAUDICATION 08/16/2008  . ANEMIA, HX OF 08/16/2008  . AORTIC VALVE REPLACEMENT, HX OF 08/16/2008  . PACEMAKER, PERMANENT 08/16/2008  . ERECTILE DYSFUNCTION 11/26/2006  . DM (diabetes mellitus) type II uncontrolled, periph vascular disorder ([place]) 07/28/2006  . OBESITY, MORBID 07/28/2006  . Obstructive sleep apnea 07/28/2006  . Essential hypertension 07/28/2006  . CAD (coronary artery disease) 07/28/2006  . RENAL ARTERY STENOSIS 07/28/2006  . INSUFFICIENCY, VASCULAR NOS, INTESTINE 07/28/2006  . NEPHROLITHIASIS, HX OF 07/28/2006   Past Medical History:  Diagnosis Date  . Aortal stenosis 2006   biophrostetic  . Arthritis   . Atrial fibrillation ([place])    onset after CABG--- coumadin management [facility]  . Carotid artery disease ([place]) 09/27/2010   Carotid US 12/17: bilat ICA 40-59 >> 1 year FU  . CHF (congestive heart failure) ([place])    abter CABG  . Complete heart block ([place])   . Coronary artery disease   . DM type 2 (diabetes mellitus, type 2) ([place])    used to see Dr Meredith Pel, now f/u by Dr Larose Kells  . Erectile dysfunction   . Gangrene ([place])   . History of nephrolithiasis   . HTN (hypertension)   . Hyperlipidemia   . Memory loss   . Obesity   . OSA (obstructive sleep apnea)    not using CPAP  . Pacemaker 2/07   VVI  . PVD (peripheral vascular disease) ([name])    CT angio showed- vascular insuff, intestine and RAS bilaterally, andgiogram 02/2009 severe PVD medical management  . S/P aortic valve replacement   . Sick sinus syndrome ([name])    [name]  . Skin cancer    surgery (nose) 09-2010    Family History  Problem Relation Age of Onset  . Heart attack Father 81  . Heart disease Father     Before age 75  . Hypertension Father   . Stroke Mother 64  . Cancer Sister     Cervical  . Heart attack Sister   . Heart disease Sister    . Diabetes Sister     2 sister w/ DM  . Heart attack Sister   . Heart attack Sister   . Heart disease Sister 95    AAA  and  Stomach Aneurysm  . AAA (abdominal aortic aneurysm) Sister   . Heart disease Sister     Before age 60  . Heart attack Sister 59  . Colon cancer Neg Hx   . Prostate cancer Neg Hx     Past Surgical History:  Procedure Laterality Date  . ABDOMINAL AORTAGRAM N/A 11/10/2012   Procedure: ABDOMINAL Maxcine Ham;  Surgeon: Serafina Mitchell, MD;  Location: [facility] CATH LAB;  Service: Cardiovascular;  Laterality: N/A;  . AMPUTATION  01/2010   great toe  . AMPUTATION Left 07/16/2016   Procedure: RECECTION OF LEFT GREAT TOE METATARSAL HEAD and sesamoid;  Surgeon: Waynetta Sandy, MD;  Location: [facility] [place];  Service: Vascular;  Laterality: Left;  .  AMPUTATION Left 07/31/2016   Procedure: SECOND TOE AMPUTATION;  Surgeon: Serafina Mitchell, MD;  Location: [facility] OR;  Service: Vascular;  Laterality: Left;  . ANGIOPLASTY / STENTING FEMORAL  02/10/10   Left femoral  . ANGIOPLASTY ILLIAC ARTERY Left 07/16/2016   Procedure: drug coated Balloon ANGIOPLASTY of left superficial femoral artry stent;  Surgeon: Waynetta Sandy, MD;  Location: [facility];  Service: Vascular;  Laterality: Left;  . AORTIC VALVE REPLACEMENT     biophrostetic for Ao Stenosis 2006  . AORTIC VALVE REPLACEMENT  01/17/2011   S/P redo median sternotomy, extracorporeal circulation, redo Aortic Valve Replacement using a 23-mm Edwards pericardial Magna-Ease valve, Dr Ulla Gallo  . AORTOGRAM N/A 07/16/2016   Procedure: left lower extremity angiogram;  Surgeon: Waynetta Sandy, MD;  Location: [facility];  Service: Vascular;  Laterality: N/A;  . APPLICATION OF WOUND VAC Left 07/31/2016   Procedure: APPLICATION OF WOUND VAC;  Surgeon: Serafina Mitchell, MD;  Location: [facility];  Service: Vascular;  Laterality: Left;  . CATARACT EXTRACTION, BILATERAL    . CORONARY ARTERY BYPASS GRAFT  2006  . EP IMPLANTABLE DEVICE N/A 06/06/2016    Procedure: PPM Generator Changeout;  Surgeon: Thompson Grayer, MD;  Location: [place] CV LAB;  Service: Cardiovascular;  Laterality: N/A;  . ESOPHAGOGASTRODUODENOSCOPY (EGD) WITH PROPOFOL N/A 08/07/2016   Procedure: ESOPHAGOGASTRODUODENOSCOPY (EGD) WITH PROPOFOL;  Surgeon: Mauri Pole, MD;  Location: [facility];  Service: Endoscopy;  Laterality: N/A;  . EYE SURGERY    . ILIAC ATHERECTOMY Left 07/16/2016   Procedure: Angioplasty and ATHERECTOMY of left anterior tibial artery;  Surgeon: Waynetta Sandy, MD;  Location: [facility];  Service: Vascular;  Laterality: Left;  . INCISE AND DRAIN ABCESS Right May 2014   right heel   . LOWER EXTREMITY ANGIOGRAM Left 11/23/2012   Procedure: LOWER EXTREMITY ANGIOGRAM;  Surgeon: Serafina Mitchell, MD;  Location[facility] C[facility] LAB;  Service: Cardiovascular;  Laterality: Left;  . PACEMAKER PLACEMENT  07/2005   Medtronic Sigma SR implanted by Dr Olevia Perches  . PERIPHERAL VASCULAR CATHETERIZATION N/A 07/10/2016   Procedure: Abdominal Aortogram;  Surgeon: Conrad [place], MD;  Location: [place] CV LAB;  Service: Cardiovascular;  Laterality: N/A;  . PERIPHERAL VASCULAR CATHETERIZATION Bilateral 07/10/2016   Procedure: Lower Extremity Angiography;  Surgeon: Conrad [place], MD;  Location: [place] CV LAB;  Service: Cardiovascular;  Laterality: Bilateral;  . TONSILLECTOMY    . ULTRASOUND GUIDANCE FOR VASCULAR ACCESS Bilateral 07/16/2016   Procedure: ULTRASOUND GUIDANCE FOR VASCULAR ACCESS of right common femoral and left dorsalis pedis;  Surgeon: Waynetta Sandy, MD;  Location[facility];  Service: Vascular;  Laterality: Bilateral;  . WOUND DEBRIDEMENT Left 07/31/2016   Procedure: DEBRIDEMENT GREAT TOE AMPUTATION SITE;  Surgeon: Serafina Mitchell, MD;  Location: [facility] OR;  Service: Vascular;  Laterality: Left;   Social History   Occupational History  . retired- long distance Administrator  Retired   Social History Main Topics  . Smoking status: Former Smoker     Types: Cigarettes    Quit date: 06/23/1988  . Smokeless tobacco: Former Systems developer  . Alcohol use No  . Drug use: No  . Sexual activity: Not Currently

## 2016-08-22 DIAGNOSIS — L89010 Pressure ulcer of right elbow, unstageable: Secondary | ICD-10-CM | POA: Diagnosis not present

## 2016-08-22 DIAGNOSIS — L89610 Pressure ulcer of right heel, unstageable: Secondary | ICD-10-CM | POA: Diagnosis not present

## 2016-08-22 DIAGNOSIS — T8131XA Disruption of external operation (surgical) wound, not elsewhere classified, initial encounter: Secondary | ICD-10-CM | POA: Diagnosis not present

## 2016-08-22 DIAGNOSIS — L89150 Pressure ulcer of sacral region, unstageable: Secondary | ICD-10-CM | POA: Diagnosis not present

## 2016-08-22 DIAGNOSIS — L89890 Pressure ulcer of other site, unstageable: Secondary | ICD-10-CM | POA: Diagnosis not present

## 2016-08-25 ENCOUNTER — Encounter: Payer: Medicare Other | Admitting: Surgery

## 2016-08-25 ENCOUNTER — Encounter: Payer: Self-pay | Admitting: Surgery

## 2016-08-25 ENCOUNTER — Ambulatory Visit (INDEPENDENT_AMBULATORY_CARE_PROVIDER_SITE_OTHER): Payer: Self-pay | Admitting: Surgery

## 2016-08-25 VITALS — BP 109/67 | HR 73 | Temp 97.0°F | Resp 20 | Ht 70.0 in | Wt 175.0 lb

## 2016-08-25 DIAGNOSIS — I779 Disorder of arteries and arterioles, unspecified: Secondary | ICD-10-CM

## 2016-08-25 NOTE — Progress Notes (Signed)
[facility] [place]  Patient name: [name]. MRN: [id number] DOB: Jan 21, 1935 Sex: male   REASON FOR VISIT:    Follow u p ulcer  HISOTRY OF PRESENT ILLNESS:   The patient was seen earlier this year with a right heel ulcer x2 as well as a breakdown of the toe amputation site at the left great toe.  He has previously undergone left superficial femoral artery stenting and subintimal recanalization of an occluded left anterior tibial artery with balloon angioplasty in 2011.  This was done in the setting of an ischemic left toe ulcer which ultimately required left great toe amputation.  In May of 2014, he underwent successful recanalization of an occluded right anterior tibial artery with subsequent balloon angioplasty using a 3 mm balloon.  He was brought back several weeks later to address a high-grade stenosis vs. occlusion of his left superficial femoral artery stent.  He underwent successful stenting of a nearly occluded left superficial femoral artery stent using a 7 x 1 20 self-expanding stent.  This was done in June of 2014. He was lost to follow-up.  He presented to the hospital in early 2018 with left foot osteomyelitis.  On 07/16/2016 he underwent balloon angioplasty of left anterior tibial artery and drug coated balloon angioplasty left superficial femoral artery, requiring pedal access and great toe amputation by Dr. Donzetta Matters.  .  On 07/31/2016 he went back to the operating room for further debridement.    He now comes in today from [facility].  He has a wound VAC on his left foot ulcer and a new area which has progressed on the lateral side left foot.  There is also concerns over right heel ulcer.  PAST MEDICAL HISTORY:   Past Medical History:  Diagnosis Date  . Aortal stenosis 2006   biophrostetic  . Arthritis   . Atrial fibrillation ([place])    onset after CABG--- coumadin management [facility]  . Carotid  artery disease ([place]) 09/27/2010   Carotid US 12/17: bilat ICA 40-59 >> 1 year FU  . CHF (congestive heart failure) ([place])    abter CABG  . Complete heart block ([place])   . Coronary artery disease   . DM type 2 (diabetes mellitus, type 2) ([place])    used to see Dr Meredith Pel, now f/u by Dr Larose Kells  . Erectile dysfunction   . Gangrene ([place])   . History of nephrolithiasis   . HTN (hypertension)   . Hyperlipidemia   . Memory loss   . Obesity   . OSA (obstructive sleep apnea)    not using CPAP  . Pacemaker 2/07   VVI  . PVD (peripheral vascular disease) ([name])    CT angio showed- vascular insuff, intestine and RAS bilaterally, andgiogram 02/2009 severe PVD medical management  . S/P aortic valve replacement   . Sick sinus syndrome ([name])    [name]  . Skin cancer    surgery (nose) 09-2010     FAMILY HISTORY:   Family History  Problem Relation Age of Onset  . Heart attack Father 49  . Heart disease Father     Before age 83  . Hypertension Father   . Stroke Mother 73  . Cancer Sister     Cervical  . Heart attack Sister   . Heart disease Sister   . Diabetes Sister     2 sister w/ DM  . Heart attack Sister   . Heart attack Sister   .  Heart disease Sister 63    AAA  and  Stomach Aneurysm  . AAA (abdominal aortic aneurysm) Sister   . Heart disease Sister     Before age 54  . Heart attack Sister 69  . Colon cancer Neg Hx   . Prostate cancer Neg Hx     SOCIAL HISTORY:   Social History  Substance Use Topics  . Smoking status: Former Smoker    Types: Cigarettes    Quit date: 06/23/1988  . Smokeless tobacco: Former Systems developer  . Alcohol use No     ALLERGIES:   Allergies  Allergen Reactions  . Potassium-Containing Compounds Anaphylaxis    IV--loss of conciousness  . Metoprolol Succinate Other (See Comments)    Extremely tired     CURRENT MEDICATIONS:   Current Outpatient Prescriptions  Medication Sig Dispense Refill  . acetaminophen (TYLENOL) 325 MG tablet Take 2  tablets (650 mg total) by mouth every 6 (six) hours as needed for mild pain (or Fever >/= 101).    Marland Kitchen albuterol (PROVENTIL) (2.5 MG/3ML) 0.083% nebulizer solution Take 3 mLs (2.5 mg total) by nebulization every 4 (four) hours as needed for wheezing or shortness of breath. 75 mL 12  . aspirin EC 81 MG EC tablet Take 1 tablet (81 mg total) by mouth daily.    Marland Kitchen docusate sodium (COLACE) 100 MG capsule Take 1 capsule (100 mg total) by mouth 2 (two) times daily. 10 capsule 0  . donepezil (ARICEPT) 10 MG tablet Take 1 tablet (10 mg total) by mouth at bedtime. 30 tablet 11  . ezetimibe-simvastatin (VYTORIN) 10-40 MG per tablet Take 1 tablet by mouth at bedtime. 30 tablet 3  . ferrous sulfate 325 (65 FE) MG tablet Take 1 tablet (325 mg total) by mouth 2 (two) times daily with a meal.  3  . furosemide (LASIX) 40 MG tablet Take 1 tablet (40 mg total) by mouth 2 (two) times daily. 30 tablet 2  . insulin glargine (LANTUS) 100 UNIT/ML injection Inject 0.05 mLs (5 Units total) into the skin at bedtime. 10 mL 11  . insulin lispro (HUMALOG) 100 UNIT/ML injection Inject 0-9 Units into the skin 3 (three) times daily with meals. <70 initiate hypoglycemic protocol, 70-120 = 0 units, 121-150 = 1 unit, 151-200 = 2 units, 201-250 = 3 units, 251-300 = 5 units, 301-350 = 7 units, 351-400 = 9 units, >400 call MD/NP    . ipratropium (ATROVENT) 0.02 % nebulizer solution Take 2.5 mLs (0.5 mg total) by nebulization every 4 (four) hours as needed for wheezing or shortness of breath. 75 mL 12  . metoprolol tartrate (LOPRESSOR) 25 MG tablet Take 1 tablet (25 mg total) by mouth 2 (two) times daily.    . Multiple Vitamins-Minerals (DECUBI-VITE) CAPS Take 1 capsule by mouth daily.    . pantoprazole (PROTONIX) 40 MG tablet Take 1 tablet (40 mg total) by mouth 2 (two) times daily.    Marland Kitchen saccharomyces boulardii (FLORASTOR) 250 MG capsule Take 1 capsule (250 mg total) by mouth 2 (two) times daily.    Marland Kitchen warfarin (COUMADIN) 3 MG tablet Take 1  tablet (3 mg total) by mouth daily at 6 PM.     No current facility-administered medications for this visit.     REVIEW OF SYSTEMS:   [X]  denotes positive finding, [ ]  denotes negative finding Cardiac  Comments:  Chest pain or chest pressure:    Shortness of breath upon exertion:    Short of breath when lying flat:  Irregular heart rhythm:        Vascular    Pain in calf, thigh, or hip brought on by ambulation:    Pain in feet at night that wakes you up from your sleep:     Blood clot in your veins:    Leg swelling:         Pulmonary    Oxygen at home:    Productive cough:     Wheezing:         Neurologic    Sudden weakness in arms or legs:     Sudden numbness in arms or legs:     Sudden onset of difficulty speaking or slurred speech:    Temporary loss of vision in one eye:     Problems with dizziness:         Gastrointestinal    Blood in stool:     Vomited blood:         Genitourinary    Burning when urinating:     Blood in urine:        Psychiatric    Major depression:         Hematologic    Bleeding problems:    Problems with blood clotting too easily:        Skin    Rashes or ulcers:        Constitutional    Fever or chills:      PHYSICAL EXAM:   Vitals:   08/25/16 1356  BP: 109/67  Pulse: 73  Resp: 20  Temp: 97 F (36.1 C)  TempSrc: Oral  Weight: 175 lb (79.4 kg)  Height: 5\' 10"  (1.778 m)    GENERAL: The patient is a well-nourished male, in no acute distress. The vital signs are documented above. CARDIAC: There is a regular rate and rhythm.  VASCULAR: Pink tissue identified within the medial aspect of the left foot at the amputation site.  Mild to minimal surrounding erythema on the incision.  There is also worsening breakdown on the skin of the lateral side of the foot approximately 4 cm x 1 cm.  There is some necrotic tissue here again minimal erythema.  He also has a softened area on the right heel. PULMONARY: Non-labored  respirations ABDOMEN: Soft and non-tender with normal pitched bowel sounds.  MUSCULOSKELETAL: There are no major deformities or cyanosis. NEUROLOGIC: No focal weakness or paresthesias are detected. PSYCHIATRIC: The patient has a normal affect.  STUDIES:   None  MEDICAL ISSUES:   Severe bilateral peripheral vascular disease: The patient will continue wound VAC to the left foot with close observation and pertinent wound care to the lateral aspect the left foot.  I suspect we have exhausted revascularization options, however I will check a duplex at his next visit in one month.  He has a new area on the right heel which is likely pressure related that has the potential to become a pressure ulcer.  I discussed with the patient and his wife to protect this area as much as possible.  I am giving him 3 weeks worth of Keflex given the small amount of erythema which I am afraid may progress    Annamarie Major, MD [facility] [phone] Pager [phone]

## 2016-08-27 ENCOUNTER — Non-Acute Institutional Stay (SKILLED_NURSING_FACILITY): Payer: Medicare Other | Admitting: Internal Medicine

## 2016-08-27 DIAGNOSIS — G309 Alzheimer's disease, unspecified: Secondary | ICD-10-CM

## 2016-08-27 DIAGNOSIS — S42201S Unspecified fracture of upper end of right humerus, sequela: Secondary | ICD-10-CM

## 2016-08-27 DIAGNOSIS — I739 Peripheral vascular disease, unspecified: Secondary | ICD-10-CM

## 2016-08-27 DIAGNOSIS — G308 Other Alzheimer's disease: Secondary | ICD-10-CM

## 2016-08-27 DIAGNOSIS — E43 Unspecified severe protein-calorie malnutrition: Secondary | ICD-10-CM

## 2016-08-27 DIAGNOSIS — I10 Essential (primary) hypertension: Secondary | ICD-10-CM

## 2016-08-27 DIAGNOSIS — F0281 Dementia in other diseases classified elsewhere with behavioral disturbance: Secondary | ICD-10-CM

## 2016-08-27 DIAGNOSIS — E1151 Type 2 diabetes mellitus with diabetic peripheral angiopathy without gangrene: Secondary | ICD-10-CM

## 2016-08-27 DIAGNOSIS — J189 Pneumonia, unspecified organism: Secondary | ICD-10-CM

## 2016-08-27 DIAGNOSIS — J90 Pleural effusion, not elsewhere classified: Secondary | ICD-10-CM | POA: Diagnosis not present

## 2016-08-27 DIAGNOSIS — K250 Acute gastric ulcer with hemorrhage: Secondary | ICD-10-CM | POA: Diagnosis not present

## 2016-08-27 DIAGNOSIS — N183 Chronic kidney disease, stage 3 unspecified: Secondary | ICD-10-CM

## 2016-08-27 DIAGNOSIS — L89899 Pressure ulcer of other site, unspecified stage: Secondary | ICD-10-CM | POA: Diagnosis not present

## 2016-08-27 DIAGNOSIS — L089 Local infection of the skin and subcutaneous tissue, unspecified: Secondary | ICD-10-CM | POA: Diagnosis not present

## 2016-08-27 DIAGNOSIS — R5381 Other malaise: Secondary | ICD-10-CM | POA: Diagnosis not present

## 2016-08-27 DIAGNOSIS — I4891 Unspecified atrial fibrillation: Secondary | ICD-10-CM

## 2016-08-27 DIAGNOSIS — D508 Other iron deficiency anemias: Secondary | ICD-10-CM

## 2016-08-27 DIAGNOSIS — I5032 Chronic diastolic (congestive) heart failure: Secondary | ICD-10-CM

## 2016-08-27 NOTE — Progress Notes (Signed)
LOCATION: [facility]  PCP: [name], MD   Code Status: full code  Goals of care: Advanced Directive information Advanced Directives 08/15/2016  Does Patient Have a Medical Advance Directive? No  Would patient like information on creating a medical advance directive? -  Pre-existing out of facility DNR order (yellow form or pink MOST form) -       Extended Emergency Contact Information Primary Emergency Contact: Apsey,Linda Address: [address]          [place], [place] [address] Johnnette Litter of [place] Phone: [phone] Mobile Phone: [phone] Relation: Spouse Secondary Emergency Contact: Sparks,Cheryl Address: [address]          [place], [place] [address] Johnnette Litter of [place] Mobile Phone: [phone] Relation: Daughter   Allergies  Allergen Reactions  . Potassium-Containing Compounds Anaphylaxis    IV--loss of conciousness  . Metoprolol Succinate Other (See Comments)    Extremely tired    Chief Complaint  Patient presents with  . New Admit To SNF    new admission     HPI:  Patient is a 82 y.o. male seen today for short term rehabilitation post hospital admission from 07/24/16-08/09/16 post fall with right humeral neck fracture. He was seen by orthopedic surgeon and a sling was placed. Conservative management was recommended. He had acute blood loss anemia from GI bleed from gastric ulcer and required blood transfusion. He underwent EGD showing superficial gastric ulcers requiring clipping for hemostasis. Once his hemoglobin was stable, Coumadin was resumed and he was continued on proton pump inhibitor. Of note, patient was undergoing rehabilitation at [facility] prior to this hospitalization. He was in the hospital from 07/03/16-07/21/16 with acute respiratory failure with right-sided pleural effusion and underwent thoracocentesis and received diuresis. He was sent to the emergency room 08/09/16 with worsening appearance of his right shoulder with  bony prominence. He was evaluated in the ED and sent back to the nursing facility for outpatient follow-up with orthopedic. He has been seen by orthopedic service 08/19/16 and recommended conservative management for the displaced fracture of his right humerus. He has medical history of severe aortic stenosis, diastolic congestive heart failure, coronary artery disease, atrial fibrillation, COPD on oxygen, type 2 diabetes mellitus, Alzheimer's dementia among others. He is seen in his room today. He is currently on antibiotic for HCAP noted on chest xray in facility.   Review of Systems:  Constitutional: Negative for fever, chills, diaphoresis.  HENT: Negative for headache, congestion, difficulty swallowing.   Eyes: Negative for blurred vision, double vision and discharge.  Respiratory: Negative for cough, shortness of breath and wheezing.   Cardiovascular: Negative for chest pain, palpitations, leg swelling.  Gastrointestinal: Negative for heartburn, nausea, vomiting, abdominal pain. Genitourinary: Negative for dysuria Musculoskeletal: Negative for back pain.   Skin: Negative for itching, rash.  Neurological: Negative for dizziness. Psychiatric/Behavioral: Positive for dementia   Past Medical History:  Diagnosis Date  . Aortal stenosis 2006   biophrostetic  . Arthritis   . Atrial fibrillation ([place])    onset after CABG--- coumadin management [facility]  . Carotid artery disease ([place]) 09/27/2010   Carotid US 12/17: bilat ICA 40-59 >> 1 year FU  . CHF (congestive heart failure) ([place])    abter CABG  . Complete heart block ([place])   . Coronary artery disease   . DM type 2 (diabetes mellitus, type 2) ([place])    used to see Dr Meredith Pel, now f/u by Dr Larose Kells  . Erectile dysfunction   .  Gangrene ([place])   . History of nephrolithiasis   . HTN (hypertension)   . Hyperlipidemia   . Memory loss   . Obesity   . OSA (obstructive sleep apnea)    not using CPAP  . Pacemaker 2/07   VVI  . PVD  (peripheral vascular disease) ([name])    CT angio showed- vascular insuff, intestine and RAS bilaterally, andgiogram 02/2009 severe PVD medical management  . S/P aortic valve replacement   . Sick sinus syndrome ([name])    [name]  . Skin cancer    surgery (nose) 09-2010   Past Surgical History:  Procedure Laterality Date  . ABDOMINAL AORTAGRAM N/A 11/10/2012   Procedure: ABDOMINAL Maxcine Ham;  Surgeon: Serafina Mitchell, MD;  Location: [facility] CATH LAB;  Service: Cardiovascular;  Laterality: N/A;  . AMPUTATION  01/2010   great toe  . AMPUTATION Left 07/16/2016   Procedure: RECECTION OF LEFT GREAT TOE METATARSAL HEAD and sesamoid;  Surgeon: Waynetta Sandy, MD;  Location: [place];  Service: Vascular;  Laterality: Left;  . AMPUTATION Left 07/31/2016   Procedure: SECOND TOE AMPUTATION;  Surgeon: Serafina Mitchell, MD;  Location: [facility] OR;  Service: Vascular;  Laterality: Left;  . ANGIOPLASTY / STENTING FEMORAL  02/10/10   Left femoral  . ANGIOPLASTY ILLIAC ARTERY Left 07/16/2016   Procedure: drug coated Balloon ANGIOPLASTY of left superficial femoral artry stent;  Surgeon: Waynetta Sandy, MD;  Location: [facility];  Service: Vascular;  Laterality: Left;  . AORTIC VALVE REPLACEMENT     biophrostetic for Ao Stenosis 2006  . AORTIC VALVE REPLACEMENT  01/17/2011   S/P redo median sternotomy, extracorporeal circulation, redo Aortic Valve Replacement using a 23-mm Edwards pericardial Magna-Ease valve, Dr Ulla Gallo  . AORTOGRAM N/A 07/16/2016   Procedure: left lower extremity angiogram;  Surgeon: Waynetta Sandy, MD;  Location: [facility];  Service: Vascular;  Laterality: N/A;  . APPLICATION OF WOUND VAC Left 07/31/2016   Procedure: APPLICATION OF WOUND VAC;  Surgeon: Serafina Mitchell, MD;  Location: [facility];  Service: Vascular;  Laterality: Left;  . CATARACT EXTRACTION, BILATERAL    . CORONARY ARTERY BYPASS GRAFT  2006  . EP IMPLANTABLE DEVICE N/A 06/06/2016   Procedure: PPM Generator  Changeout;  Surgeon: Thompson Grayer, MD;  Location: [facility] CV LAB;  Service: Cardiovascular;  Laterality: N/A;  . ESOPHAGOGASTRODUODENOSCOPY (EGD) WITH PROPOFOL N/A 08/07/2016   Procedure: ESOPHAGOGASTRODUODENOSCOPY (EGD) WITH PROPOFOL;  Surgeon: Mauri Pole, MD;  Location: [facility];  Service: Endoscopy;  Laterality: N/A;  . EYE SURGERY    . ILIAC ATHERECTOMY Left 07/16/2016   Procedure: Angioplasty and ATHERECTOMY of left anterior tibial artery;  Surgeon: Waynetta Sandy, MD;  Location: [facility];  Service: Vascular;  Laterality: Left;  . INCISE AND DRAIN ABCESS Right May 2014   right heel   . LOWER EXTREMITY ANGIOGRAM Left 11/23/2012   Procedure: LOWER EXTREMITY ANGIOGRAM;  Surgeon: Serafina Mitchell, MD;  Location[facility] C[facility] LAB;  Service: Cardiovascular;  Laterality: Left;  . PACEMAKER PLACEMENT  07/2005   Medtronic Sigma SR implanted by Dr Olevia Perches  . PERIPHERAL VASCULAR CATHETERIZATION N/A 07/10/2016   Procedure: Abdominal Aortogram;  Surgeon: Conrad [place], MD;  Location: [place] CV LAB;  Service: Cardiovascular;  Laterality: N/A;  . PERIPHERAL VASCULAR CATHETERIZATION Bilateral 07/10/2016   Procedure: Lower Extremity Angiography;  Surgeon: Conrad [place], MD;  Location: [facility] CV LAB;  Service: Cardiovascular;  Laterality: Bilateral;  . TONSILLECTOMY    .  ULTRASOUND GUIDANCE FOR VASCULAR ACCESS Bilateral 07/16/2016   Procedure: ULTRASOUND GUIDANCE FOR VASCULAR ACCESS of right common femoral and left dorsalis pedis;  Surgeon: Waynetta Sandy, MD;  Location: [facility] OR;  Service: Vascular;  Laterality: Bilateral;  . WOUND DEBRIDEMENT Left 07/31/2016   Procedure: DEBRIDEMENT GREAT TOE AMPUTATION SITE;  Surgeon: Serafina Mitchell, MD;  Location: [facility] OR;  Service: Vascular;  Laterality: Left;   Social History:   reports that he quit smoking about 28 years ago. His smoking use included Cigarettes. He has quit using smokeless tobacco. He reports that he does not drink alcohol or use  drugs.  Family History  Problem Relation Age of Onset  . Heart attack Father 60  . Heart disease Father     Before age 98  . Hypertension Father   . Stroke Mother 62  . Cancer Sister     Cervical  . Heart attack Sister   . Heart disease Sister   . Diabetes Sister     2 sister w/ DM  . Heart attack Sister   . Heart attack Sister   . Heart disease Sister 54    AAA  and  Stomach Aneurysm  . AAA (abdominal aortic aneurysm) Sister   . Heart disease Sister     Before age 36  . Heart attack Sister 50  . Colon cancer Neg Hx   . Prostate cancer Neg Hx     Medications: Allergies as of 08/27/2016      Reactions   Potassium-containing Compounds Anaphylaxis   IV--loss of conciousness   Metoprolol Succinate Other (See Comments)   Extremely tired      Medication List       Accurate as of 08/27/16  2:01 PM. Always use your most recent med list.          albuterol (2.5 MG/3ML) 0.083% nebulizer solution Commonly known as:  PROVENTIL Take 3 mLs (2.5 mg total) by nebulization every 4 (four) hours as needed for wheezing or shortness of breath.   aspirin 81 MG EC tablet Take 1 tablet (81 mg total) by mouth daily.   DECUBI-VITE Caps Take 1 capsule by mouth daily.   docusate sodium 100 MG capsule Commonly known as:  COLACE Take 1 capsule (100 mg total) by mouth 2 (two) times daily.   donepezil 10 MG tablet Commonly known as:  ARICEPT Take 1 tablet (10 mg total) by mouth at bedtime.   ezetimibe-simvastatin 10-40 MG tablet Commonly known as:  VYTORIN Take 1 tablet by mouth at bedtime.   ferrous sulfate 325 (65 FE) MG tablet Take 1 tablet (325 mg total) by mouth 2 (two) times daily with a meal.   furosemide 40 MG tablet Commonly known as:  LASIX Take 1 tablet (40 mg total) by mouth 2 (two) times daily.   HUMALOG 100 UNIT/ML injection Generic drug:  insulin lispro Inject 0-9 Units into the skin 3 (three) times daily with meals. <70 initiate hypoglycemic protocol, 70-120 = 0  units, 121-150 = 1 unit, 151-200 = 2 units, 201-250 = 3 units, 251-300 = 5 units, 301-350 = 7 units, 351-400 = 9 units, >400 call MD/NP   insulin glargine 100 UNIT/ML injection Commonly known as:  LANTUS Inject 0.05 mLs (5 Units total) into the skin at bedtime.   ipratropium 0.02 % nebulizer solution Commonly known as:  ATROVENT Take 2.5 mLs (0.5 mg total) by nebulization every 4 (four) hours as needed for wheezing or shortness of breath.   metoprolol tartrate  25 MG tablet Commonly known as:  LOPRESSOR Take 1 tablet (25 mg total) by mouth 2 (two) times daily.   pantoprazole 40 MG tablet Commonly known as:  PROTONIX Take 1 tablet (40 mg total) by mouth 2 (two) times daily.   saccharomyces boulardii 250 MG capsule Commonly known as:  FLORASTOR Take 1 capsule (250 mg total) by mouth 2 (two) times daily.   warfarin 3 MG tablet Commonly known as:  COUMADIN Take 1 tablet (3 mg total) by mouth daily at 6 PM.       Immunizations: Immunization History  Administered Date(s) Administered  . Influenza Whole 03/05/2010  . Influenza,inj,Quad PF,36+ Mos 07/18/2013, 05/15/2014, 06/13/2015, 04/23/2016  . PPD Test 07/23/2016  . Pneumococcal Polysaccharide-23 12/17/2007  . Td 07/24/2000     Physical Exam: Vitals:   08/27/16 1354  BP: 130/89  Pulse: 83  Resp: 18  Temp: 98.6 F (37 C)  SpO2: 99%    General- elderly male, frail and thin built, in no acute distress Head- normocephalic, atraumatic Nose- no nasal discharge Throat- moist mucus membrane Eyes- PERRLA, EOMI, no pallor, no icterus, no discharge, normal conjunctiva, normal sclera Neck- no cervical lymphadenopathy Cardiovascular- normal s1,s2, no murmur Respiratory- bilateral clear to auscultation, no wheeze, no rhonchi, no crackles, no use of accessory muscles Abdomen- bowel sounds present, soft, non tender, no guarding or rigidity Musculoskeletal- able to move all 4 extremities, generalized weakness, limited range of  motion to right shoulder with right arm held close to his body, heel floaters in place, left great toe amputated with wound vac to left foot, good radial and brachial pulse in both arms Neurological- alert and oriented to self only Skin- warm and dry, easy bruising, left arm skin tear, unstageable pressure ulcer to sacrum, left fifth toe lateral side, unstageable pressure ulcer to right elbow, suspected deep tissue injury to right heel with erythema Psychiatry- normal mood and affect    Labs reviewed: Basic Metabolic Panel:  Recent Labs  07/30/16 1352  08/04/16 1032  08/07/16 0407 08/08/16 0308 08/08/16 2300  NA  --   < > 137  < > 138 138 135  K  --   < > 4.4  < > 4.5 4.3 4.1  CL  --   < > 98*  < > 100* 100* 96*  CO2  --   < > 32  < > 33* 33* 33*  GLUCOSE  --   < > 172*  < > 183* 172* 143*  BUN  --   < > 29*  < > 39* 38* 38*  CREATININE  --   < > 0.89  < > 0.99 1.06 1.09  CALCIUM  --   < > 9.4  < > 9.9 9.7 9.6  MG 1.9  --  1.8  --  1.8  --   --   < > = values in this interval not displayed. Liver Function Tests:  Recent Labs  07/04/16 0502 07/24/16 2149 08/08/16 2300  AST 29 50* 53*  ALT 25 17 34  ALKPHOS 129* 134* 174*  BILITOT 0.8 0.4 0.8  PROT 6.8 6.3* 6.1*  ALBUMIN 2.5* 2.1* 1.9*   No results for input(s): LIPASE, AMYLASE in the last 8760 hours. No results for input(s): AMMONIA in the last 8760 hours. CBC:  Recent Labs  07/26/16 0414 07/27/16 0150 07/28/16 0302  08/07/16 0407 08/08/16 0308 08/08/16 0804 08/08/16 2300  WBC 10.6* 10.4 11.8*  < > 14.7* 10.2  --  9.5  NEUTROABS 8.1* 7.8* 9.4*  --   --   --   --   --   HGB 8.9* 8.3* 8.0*  < > 9.0* 8.2* 8.7* 8.0*  HCT 29.1* 27.3* 26.2*  < > 30.1* 28.5* 29.7* 26.7*  MCV 89.0 89.5 90.3  < > 90.7 91.3  --  90.5  PLT 326 331 308  < > 290 262  --  260  < > = values in this interval not displayed. Cardiac Enzymes:  Recent Labs  08/08/16 2300 08/09/16 0546  CKTOTAL 11*  --   CKMB 3.4  --   TROPONINI 0.06*  0.06*   BNP: Invalid input(s): POCBNP CBG:  Recent Labs  08/08/16 1610 08/08/16 1646 08/08/16 2235  GLUCAP 187* 195* 140*    Radiological Exams: Dg Chest 1 View  Result Date: 08/07/2016 CLINICAL DATA:  Post right thoracentesis EXAM: CHEST 1 VIEW COMPARISON:  08/06/2016 FINDINGS: Significant decrease in right pleural effusion following thoracentesis. Small remaining right pleural effusion. No pneumothorax. Bilateral airspace disease and vascular congestion most compatible with pulmonary edema which has progressed in the interval. Single lead transvenous pacemaker noted. Prior CABG and aortic valve replacement IMPRESSION: Decreased right pleural effusion following thoracentesis. No pneumothorax Progression of airspace disease consistent with edema. Electronically Signed   By: Franchot Gallo M.D.   On: 08/07/2016 16:22   Dg Chest 1 View  Result Date: 07/28/2016 CLINICAL DATA:  82 year old male status post right chest ultrasound-guided thoracentesis today. Initial encounter. EXAM: CHEST 1 VIEW COMPARISON:  07/27/2016 and earlier. FINDINGS: AP semi upright view at 1513 hours. Decreased right pleural effusion with no pneumothorax. Small volume residual including some pleural fluid in the minor fissure. Improved right mid and lower lung ventilation. Stable cardiomegaly and mediastinal contours. Stable left lung. IMPRESSION: Decreased right pleural effusion and increased right lung ventilation with no pneumothorax. Electronically Signed   By: Genevie Ann M.D.   On: 07/28/2016 15:32   Dg Shoulder Right  Result Date: 08/09/2016 CLINICAL DATA:  Acute onset of right arm pain, with right shoulder deformity. Recently diagnosed right humeral neck fracture. Subsequent encounter. EXAM: RIGHT SHOULDER - 2+ VIEW COMPARISON:  Right shoulder radiographs performed 07/24/2016 FINDINGS: There is superior subluxation of the right humeral head, with mildly increased widening of the right acromioclavicular joint to 4 cm.  There is also increased anterior displacement of the distal humerus, measuring approximately 1 shaft width at the level of the humeral neck fracture. Overlying soft tissue swelling is noted. No additional fractures are identified. There appears to be some degree of winging of the scapula. Diffuse vascular calcifications are seen. IMPRESSION: 1. Increased anterior displacement of the distal humerus, measuring approximately 1 shaft width at the level of the patient's right humeral neck fracture. 2. Superior subluxation of the right humeral head, with mildly increased widening of the right acromioclavicular joint to 4 cm. 3. Diffuse vascular calcifications seen. Electronically Signed   By: Garald Balding M.D.   On: 08/09/2016 20:49   Dg Chest Port 1 View  Result Date: 08/06/2016 CLINICAL DATA:  Hypoxia. EXAM: PORTABLE CHEST 1 VIEW COMPARISON:  Seven days prior FINDINGS: Progressive haziness of the right chest with the appearance of layering pleural effusion, moderate or large. Better aerated left chest. Status post CABG and aortic valve replacement. Single chamber pacer from the left in stable position. No pulmonary edema or pneumothorax noted. IMPRESSION: 1. Progressed right pleural effusion that is moderate to large. 2. Cardiomegaly. 3. No pulmonary edema. Electronically Signed   By:  Monte Fantasia M.D.   On: 08/06/2016 09:37   Dg Chest Port 1 View  Result Date: 07/30/2016 CLINICAL DATA:  Recurrent RIGHT pleural effusion. EXAM: PORTABLE CHEST 1 VIEW COMPARISON:  07/28/2016 FINDINGS: Sternotomy wires overlie normal cardiac silhouette. Lung bases are poorly evaluated. Fine pulmonary edema pattern similar to comparison exam. No pneumothorax. IMPRESSION: Cardiomegaly and mild pulmonary edema.  No significant change. Lung bases are poorly defined to body habitus and overlying soft tissue Electronically Signed   By: Suzy Bouchard M.D.   On: 07/30/2016 13:59   US Thoracentesis Asp Pleural Space W/img  Guide  Result Date: 08/07/2016 INDICATION: Shortness of breath. Large right pleural effusion. Request therapeutic thoracentesis. EXAM: ULTRASOUND GUIDED RIGHT THORACENTESIS MEDICATIONS: None. COMPLICATIONS: None immediate. PROCEDURE: An ultrasound guided thoracentesis was thoroughly discussed with the patient and questions answered. The benefits, risks, alternatives and complications were also discussed. The patient understands and wishes to proceed with the procedure. Written consent was obtained. Ultrasound was performed to localize and mark an adequate pocket of fluid in the right chest. The area was then prepped and draped in the normal sterile fashion. 1% Lidocaine was used for local anesthesia. Under ultrasound guidance a Safe-T-Centesis catheter was introduced. Thoracentesis was performed. The catheter was removed and a dressing applied. FINDINGS: A total of approximately 2.3 L of clear, amber colored fluid was removed. IMPRESSION: Successful ultrasound guided right thoracentesis yielding 2.3 L of pleural fluid. Read by: Ascencion Dike PA-C Electronically Signed   By: Aletta Edouard M.D.   On: 08/07/2016 16:20   US Thoracentesis Asp Pleural Space W/img Guide  Result Date: 07/28/2016 INDICATION: Patient with hypoxic respiratory failure and right pleural effusion. Request is made for diagnostic and therapeutic thoracentesis. EXAM: ULTRASOUND GUIDED DIAGNOSTIC AND THERAPEUTIC RIGHT THORACENTESIS MEDICATIONS: 10 mL 1% lidocaine COMPLICATIONS: None immediate. PROCEDURE: An ultrasound guided thoracentesis was thoroughly discussed with the patient and questions answered. The benefits, risks, alternatives and complications were also discussed. The patient understands and wishes to proceed with the procedure. Written consent was obtained. Ultrasound was performed to localize and mark an adequate pocket of fluid in the right chest. The area was then prepped and draped in the normal sterile fashion. 1% Lidocaine  was used for local anesthesia. Under ultrasound guidance a Safe-T-centesis catheter was introduced. Thoracentesis was performed. The catheter was removed and a dressing applied. FINDINGS: A total of approximately 2.1 liters of clear, yellow fluid was removed. Samples were sent to the laboratory as requested by the clinical team. IMPRESSION: Successful ultrasound guided diagnostic and therapeutic right thoracentesis yielding 2.1 liters of pleural fluid. Read by:  Brynda Greathouse PA-C Electronically Signed   By: Corrie Mckusick D.O.   On: 07/28/2016 15:34     Assessment/Plan  Physical deconditioning Will have him work with physical therapy and occupational therapy team to help with gait training and muscle strengthening exercises.fall precautions. Skin care. Encourage to be out of bed. He remains a high fall risk. Will get palliative care consult to review goals of care.  Left Lateral foot wound infection Continue with wound vac, monitor for worsening signs of infection. Continue and complete course of keflex on 09/16/16. Discontinue septra for now  Right humerus displaced fracture To wear sling all the time. Will need follow up with orthopedics. Medical management given his dementia. Continue tylenol 650 mg tid for pain.   Gastric ulcer With gi bleed, s/p clip placement for hemostasis, continue protonix 40 mg bid. Monitor for bleed and cbc  HCAP Currently on doxycycline  until 08/29/16, breathing currently stable, he is a high aspiration risk. He is alos on keflex and septra. Thus, d/c septra and doxycycline, continue o2. SLP to follow.   Right pleural effusion 's/p thoracocentesis. Clinically stable. Get cxr to assess further  Severe PVD Followed by vein and vascular. S/p wound debridement, left lower extremity stent and left great toe amputation. Continue to provide wound care. Continue aspirin  Multiple pressure ulcer decubivite to promote healing, low air loss air mattress, frequent  re-positioning and continence care.   Diabetes with PVD Lab Results  Component Value Date   HGBA1C 7.4 (H) 07/06/2016   Monitor cbg. Continue lantus 5 u daily, SSI humalog  Alzheimer's dementia with behavioral disturbance Provide supportive care. Get palliative care consult. Continue aricept for now and seroquel at bedtime  Severe protein calorie malnutrition With advanced dementia, multiple pressure ulcer and non healing wound, has poor prognosis and decline is anticipated. Monitor weight and oral intake. RD on board.  afib Continue nmetoprolol tartra, with supratherapeutic inr, coumadin is on hold. Check INR 08/28/16  HTN Continue metoprolol tartrate 25 mg bid, monitor BP  Iron deficiency anemia S/p several units PRBC transfusion. Continue ferrous sulfate, monitor cbc  Chronic diastolic chf Continue lasix 40 mg bid, metoprolol 25 mg bid and monitor clinically. Breathing stable. No signs of fluid overload on exam  Chronic respiratory failure Breathing stable, continue oxygen and bronchodilators  ckd stage 3 Monitor bmp   Goals of care: short term rehabilitation, possible LTC   Labs/tests ordered: cbc, cmp 08/29/16  Family/ staff Communication: reviewed care plan with patient's nursing supervisor    Blanchie Serve, MD Internal Medicine [place], [place] [address] Cell Phone (Monday-Friday 8 am - 5 pm): [phone] On Call: [phone] and follow prompts after 5 pm and on weekends Office Phone: [phone] Office Fax: [phone]

## 2016-08-29 ENCOUNTER — Encounter (HOSPITAL_COMMUNITY): Payer: Medicare Other

## 2016-08-29 ENCOUNTER — Encounter: Payer: Medicare Other | Admitting: Vascular Surgery

## 2016-08-29 DIAGNOSIS — K5900 Constipation, unspecified: Secondary | ICD-10-CM | POA: Diagnosis not present

## 2016-08-29 DIAGNOSIS — L89150 Pressure ulcer of sacral region, unstageable: Secondary | ICD-10-CM | POA: Diagnosis not present

## 2016-08-29 DIAGNOSIS — L89610 Pressure ulcer of right heel, unstageable: Secondary | ICD-10-CM | POA: Diagnosis not present

## 2016-08-29 DIAGNOSIS — T8131XA Disruption of external operation (surgical) wound, not elsewhere classified, initial encounter: Secondary | ICD-10-CM | POA: Diagnosis not present

## 2016-08-29 DIAGNOSIS — L89010 Pressure ulcer of right elbow, unstageable: Secondary | ICD-10-CM | POA: Diagnosis not present

## 2016-08-29 DIAGNOSIS — L89890 Pressure ulcer of other site, unstageable: Secondary | ICD-10-CM | POA: Diagnosis not present

## 2016-08-29 LAB — CBC AND DIFFERENTIAL
HEMATOCRIT: 30 % — AB (ref 41–53)
HEMOGLOBIN: 9.1 g/dL — AB (ref 13.5–17.5)
PLATELETS: 288 10*3/uL (ref 150–399)
WBC: 8.5 10^3/mL

## 2016-08-29 LAB — HEPATIC FUNCTION PANEL
ALT: 22 U/L (ref 10–40)
AST: 29 U/L (ref 14–40)
Alkaline Phosphatase: 304 U/L — AB (ref 25–125)
Bilirubin, Total: 0.4 mg/dL

## 2016-08-29 LAB — BASIC METABOLIC PANEL
BUN: 36 mg/dL — AB (ref 4–21)
Creatinine: 1.1 mg/dL (ref 0.6–1.3)
Glucose: 91 mg/dL
Potassium: 3.6 mmol/L (ref 3.4–5.3)
Sodium: 139 mmol/L (ref 137–147)

## 2016-09-01 ENCOUNTER — Encounter: Payer: Self-pay | Admitting: Surgery

## 2016-09-02 DIAGNOSIS — S42294A Other nondisplaced fracture of upper end of right humerus, initial encounter for closed fracture: Secondary | ICD-10-CM | POA: Diagnosis not present

## 2016-09-02 DIAGNOSIS — M25511 Pain in right shoulder: Secondary | ICD-10-CM | POA: Diagnosis not present

## 2016-09-02 DIAGNOSIS — L89159 Pressure ulcer of sacral region, unspecified stage: Secondary | ICD-10-CM | POA: Diagnosis not present

## 2016-09-02 DIAGNOSIS — R5381 Other malaise: Secondary | ICD-10-CM | POA: Diagnosis not present

## 2016-09-02 DIAGNOSIS — R2681 Unsteadiness on feet: Secondary | ICD-10-CM | POA: Diagnosis not present

## 2016-09-02 DIAGNOSIS — S91302D Unspecified open wound, left foot, subsequent encounter: Secondary | ICD-10-CM | POA: Diagnosis not present

## 2016-09-02 DIAGNOSIS — I739 Peripheral vascular disease, unspecified: Secondary | ICD-10-CM | POA: Diagnosis not present

## 2016-09-02 DIAGNOSIS — S91301S Unspecified open wound, right foot, sequela: Secondary | ICD-10-CM | POA: Diagnosis not present

## 2016-09-05 DIAGNOSIS — L89610 Pressure ulcer of right heel, unstageable: Secondary | ICD-10-CM | POA: Diagnosis not present

## 2016-09-05 DIAGNOSIS — R5381 Other malaise: Secondary | ICD-10-CM | POA: Diagnosis not present

## 2016-09-05 DIAGNOSIS — L89159 Pressure ulcer of sacral region, unspecified stage: Secondary | ICD-10-CM | POA: Diagnosis not present

## 2016-09-05 DIAGNOSIS — I739 Peripheral vascular disease, unspecified: Secondary | ICD-10-CM | POA: Diagnosis not present

## 2016-09-05 DIAGNOSIS — T8131XA Disruption of external operation (surgical) wound, not elsewhere classified, initial encounter: Secondary | ICD-10-CM | POA: Diagnosis not present

## 2016-09-05 DIAGNOSIS — L89010 Pressure ulcer of right elbow, unstageable: Secondary | ICD-10-CM | POA: Diagnosis not present

## 2016-09-05 DIAGNOSIS — M25511 Pain in right shoulder: Secondary | ICD-10-CM | POA: Diagnosis not present

## 2016-09-05 DIAGNOSIS — R2681 Unsteadiness on feet: Secondary | ICD-10-CM | POA: Diagnosis not present

## 2016-09-05 DIAGNOSIS — L89150 Pressure ulcer of sacral region, unstageable: Secondary | ICD-10-CM | POA: Diagnosis not present

## 2016-09-05 DIAGNOSIS — S91301S Unspecified open wound, right foot, sequela: Secondary | ICD-10-CM | POA: Diagnosis not present

## 2016-09-05 DIAGNOSIS — S91302D Unspecified open wound, left foot, subsequent encounter: Secondary | ICD-10-CM | POA: Diagnosis not present

## 2016-09-05 DIAGNOSIS — S42294A Other nondisplaced fracture of upper end of right humerus, initial encounter for closed fracture: Secondary | ICD-10-CM | POA: Diagnosis not present

## 2016-09-09 ENCOUNTER — Other Ambulatory Visit: Payer: Self-pay | Admitting: Internal Medicine

## 2016-09-12 ENCOUNTER — Encounter: Payer: Self-pay | Admitting: Adult Health

## 2016-09-12 ENCOUNTER — Other Ambulatory Visit: Payer: Self-pay

## 2016-09-12 ENCOUNTER — Non-Acute Institutional Stay (SKILLED_NURSING_FACILITY): Payer: Medicare Other | Admitting: Adult Health

## 2016-09-12 DIAGNOSIS — L089 Local infection of the skin and subcutaneous tissue, unspecified: Secondary | ICD-10-CM

## 2016-09-12 DIAGNOSIS — I739 Peripheral vascular disease, unspecified: Secondary | ICD-10-CM

## 2016-09-12 DIAGNOSIS — T8781 Dehiscence of amputation stump: Secondary | ICD-10-CM | POA: Diagnosis not present

## 2016-09-12 DIAGNOSIS — L89014 Pressure ulcer of right elbow, stage 4: Secondary | ICD-10-CM | POA: Diagnosis not present

## 2016-09-12 DIAGNOSIS — Z7901 Long term (current) use of anticoagulants: Secondary | ICD-10-CM

## 2016-09-12 DIAGNOSIS — T148XXA Other injury of unspecified body region, initial encounter: Secondary | ICD-10-CM

## 2016-09-12 DIAGNOSIS — I4891 Unspecified atrial fibrillation: Secondary | ICD-10-CM

## 2016-09-12 DIAGNOSIS — Z89412 Acquired absence of left great toe: Secondary | ICD-10-CM | POA: Diagnosis not present

## 2016-09-12 DIAGNOSIS — L02612 Cutaneous abscess of left foot: Secondary | ICD-10-CM | POA: Diagnosis not present

## 2016-09-12 NOTE — Progress Notes (Signed)
DATE:  09/12/2016   MRN:  [id number]  BIRTHDAY: 07-25-1934  Facility:  Nursing Home Location:  [place] Room Number: 903-E  LEVEL OF CARE:  [id number]  Contact Information    Name Relation Home Work Harney Spouse [phone]  [phone]   Sparks,Cheryl Daughter   [phone]   Salli Quarry Daughter   [phone]       Code Status History    Date Active Date Inactive Code Status Order ID Comments User Context   07/25/2016 12:36 AM 08/09/2016  2:12 PM Full Code [id number]  Norval Morton, MD ED   07/03/2016  6:35 PM 07/22/2016  1:34 AM Full Code [id number]  Jonetta [place], MD ED   06/06/2016  6:44 PM 06/08/2016  3:11 AM Full Code [id number]  Thompson Grayer, MD Inpatient   06/06/2016  6:43 PM 06/06/2016  6:44 PM Full Code [id number]  Thompson Grayer, MD Inpatient   06/02/2014  6:36 PM 06/07/2014  4:52 PM Full Code [id number]  Charlynne Cousins, MD Inpatient       Chief Complaint  Patient presents with  . Acute Visit    Left halux wound infection and Coumadin therapy    HISTORY OF PRESENT ILLNESS:  This is a 81-YO male who has an open wound on left halux. Drainage was cultured and final result showed  Light growth of enterobacter aerogenes, scant growth of enterococcus faecalis and gram stain showed few white blood cells, rare gram positive cocci and rare gram negative rods. He is currently on Cipro and Keflex for left halux wound infection.   He was seen in the room today and did not complain of any pain.   PAST MEDICAL HISTORY:  Past Medical History:  Diagnosis Date  . Aortal stenosis 2006   biophrostetic  . Arthritis   . Atrial fibrillation ([place])    onset after CABG--- coumadin management [facility]  . Carotid artery disease ([place]) 09/27/2010   Carotid US 12/17: bilat ICA 40-59 >> 1 year FU  . CHF (congestive heart failure) ([place])    abter CABG  . Complete heart block ([place])   . Coronary artery disease   . DM  type 2 (diabetes mellitus, type 2) ([place])    used to see Dr Meredith Pel, now f/u by Dr Larose Kells  . Erectile dysfunction   . Gangrene ([place])   . History of nephrolithiasis   . HTN (hypertension)   . Hyperlipidemia   . Memory loss   . Obesity   . OSA (obstructive sleep apnea)    not using CPAP  . Pacemaker 2/07   VVI  . PVD (peripheral vascular disease) ([name])    CT angio showed- vascular insuff, intestine and RAS bilaterally, andgiogram 02/2009 severe PVD medical management  . S/P aortic valve replacement   . Sick sinus syndrome ([name])    [name]  . Skin cancer    surgery (nose) 09-2010     CURRENT MEDICATIONS: Reviewed  Patient's Medications  New Prescriptions   No medications on file  Previous Medications   ACETAMINOPHEN (TYLENOL) 325 MG TABLET    Take 650 mg by mouth 3 (three) times daily. 6AM, 2PM, 10PM   ALBUTEROL (PROVENTIL) (2.5 MG/3ML) 0.083% NEBULIZER SOLUTION    Take 3 mLs (2.5 mg total) by nebulization every 4 (four) hours as needed for wheezing or shortness of breath.   AMPICILLIN (PRINCIPEN) 500 MG CAPSULE    Take 500 mg by  mouth 4 (four) times daily. x7 days   ASPIRIN EC 81 MG EC TABLET    Take 1 tablet (81 mg total) by mouth daily.   CEPHALEXIN (KEFLEX) 500 MG CAPSULE    Take 500 mg by mouth 3 (three) times daily.   CIPROFLOXACIN (CIPRO) 500 MG TABLET    Take 500 mg by mouth 2 (two) times daily.   DIPHENHYDRAMINE (BENADRYL) 25 MG TABLET    Take 25 mg by mouth every 8 (eight) hours.   DOCUSATE SODIUM (COLACE) 100 MG CAPSULE    Take 1 capsule (100 mg total) by mouth 2 (two) times daily.   DONEPEZIL (ARICEPT) 10 MG TABLET    Take 1 tablet (10 mg total) by mouth at bedtime.   EZETIMIBE-SIMVASTATIN (VYTORIN) 10-40 MG PER TABLET    Take 1 tablet by mouth at bedtime.   FERROUS SULFATE 325 (65 FE) MG TABLET    Take 1 tablet (325 mg total) by mouth 2 (two) times daily with a meal.   FUROSEMIDE (LASIX) 40 MG TABLET    Take 1 tablet (40 mg total) by mouth 2 (two) times daily.    INSULIN GLARGINE (LANTUS) 100 UNIT/ML INJECTION    Inject 0.05 mLs (5 Units total) into the skin at bedtime.   INSULIN LISPRO (HUMALOG) 100 UNIT/ML INJECTION    Inject 0-9 Units into the skin 3 (three) times daily with meals. <70 initiate hypoglycemic protocol, 70-120 = 0 units, 121-150 = 1 unit, 151-200 = 2 units, 201-250 = 3 units, 251-300 = 5 units, 301-350 = 7 units, 351-400 = 9 units, >400 call MD/NP   IPRATROPIUM (ATROVENT) 0.02 % NEBULIZER SOLUTION    Take 2.5 mLs (0.5 mg total) by nebulization every 4 (four) hours as needed for wheezing or shortness of breath.   METOPROLOL TARTRATE (LOPRESSOR) 25 MG TABLET    Take 1 tablet (25 mg total) by mouth 2 (two) times daily.   MULTIPLE VITAMINS-MINERALS (DECUBI-VITE) CAPS    Take 1 capsule by mouth daily.   NYSTATIN (MYCOSTATIN/NYSTOP) POWDER    Apply topically 3 (three) times daily. Apply to groin for irritation   PANTOPRAZOLE (PROTONIX) 40 MG TABLET    Take 1 tablet (40 mg total) by mouth 2 (two) times daily.   POTASSIUM CHLORIDE SA (K-DUR,KLOR-CON) 20 MEQ TABLET    Take 20 mEq by mouth 2 (two) times daily.   QUETIAPINE (SEROQUEL) 50 MG TABLET    Take 50 mg by mouth at bedtime.   SACCHAROMYCES BOULARDII (FLORASTOR) 250 MG CAPSULE    Take 250 mg by mouth 2 (two) times daily.   UNABLE TO FIND    Apply 1 mL topically 2 (two) times daily. Med Name: Ativan/benadryl/haldol gel (1:25:1 mg/mL).  Apply 1 mL BID for anxiety/paranoia   WARFARIN (COUMADIN) 4 MG TABLET    Take 4 mg by mouth daily.  Modified Medications   No medications on file  Discontinued Medications   SACCHAROMYCES BOULARDII (FLORASTOR) 250 MG CAPSULE    Take 1 capsule (250 mg total) by mouth 2 (two) times daily.   WARFARIN (COUMADIN) 3 MG TABLET    Take 1 tablet (3 mg total) by mouth daily at 6 PM.     Allergies  Allergen Reactions  . Potassium-Containing Compounds Anaphylaxis    IV--loss of conciousness  . Metoprolol Succinate Other (See Comments)    Extremely tired      REVIEW OF SYSTEMS:  GENERAL: no change in appetite, no fatigue, no weight changes, no fever, chills or weakness  EYES: Denies change in vision, dry eyes, eye pain, itching or discharge EARS: Denies change in hearing, ringing in ears, or earache NOSE: Denies nasal congestion or epistaxis MOUTH and THROAT: Denies oral discomfort, gingival pain or bleeding, pain from teeth or hoarseness   RESPIRATORY: no cough, SOB, DOE, wheezing, hemoptysis CARDIAC: no chest pain, edema or palpitations GI: no abdominal pain, diarrhea, constipation, heart burn, nausea or vomiting GU: Denies dysuria, frequency, hematuria, incontinence, or discharge PSYCHIATRIC: Denies feeling of depression or anxiety. No report of hallucinations, insomnia, paranoia, or agitation    PHYSICAL EXAMINATION  GENERAL APPEARANCE: Well nourished. In no acute distress. Normal body habitus SKIN:  Right heel has DTI , Left hallux open wound (S/P great toe amputation)  HEAD: Normal in size and contour. No evidence of trauma EYES: Lids open and close normally. No blepharitis, entropion or ectropion. PERRL. Conjunctivae are clear and sclerae are white. Lenses are without opacity EARS: Pinnae are normal. Patient hears normal voice tunes of the examiner MOUTH and THROAT: Lips are without lesions. Oral mucosa is moist and without lesions. Tongue is normal in shape, size, and color and without lesions NECK: supple, trachea midline, no neck masses, no thyroid tenderness, no thyromegaly LYMPHATICS: no LAN in the neck, no supraclavicular LAN RESPIRATORY: breathing is even & unlabored, BS CTAB CARDIAC: RRR, no murmur,no extra heart sounds, no edema GI: abdomen soft, normal BS, no masses, no tenderness, no hepatomegaly, no splenomegaly EXTREMITIES:  Able to move X 4 extremities; right arm on sling PSYCHIATRIC: Alert to self, disoriented to time and place. Affect and behavior are appropriate   LABS/RADIOLOGY: Labs reviewed: Basic  Metabolic Panel:  Recent Labs  07/30/16 1352  08/04/16 1032  08/07/16 0407 08/08/16 0308 08/08/16 2300 08/29/16  NA  --   < > 137  < > 138 138 135 139  K  --   < > 4.4  < > 4.5 4.3 4.1 3.6  CL  --   < > 98*  < > 100* 100* 96*  --   CO2  --   < > 32  < > 33* 33* 33*  --   GLUCOSE  --   < > 172*  < > 183* 172* 143*  --   BUN  --   < > 29*  < > 39* 38* 38* 36*  CREATININE  --   < > 0.89  < > 0.99 1.06 1.09 1.1  CALCIUM  --   < > 9.4  < > 9.9 9.7 9.6  --   MG 1.9  --  1.8  --  1.8  --   --   --   < > = values in this interval not displayed. Liver Function Tests:  Recent Labs  07/04/16 0502 07/24/16 2149 08/08/16 2300 08/29/16  AST 29 50* 53* 29  ALT 25 17 34 22  ALKPHOS 129* 134* 174* 304*  BILITOT 0.8 0.4 0.8  --   PROT 6.8 6.3* 6.1*  --   ALBUMIN 2.5* 2.1* 1.9*  --    CBC:  Recent Labs  07/26/16 0414 07/27/16 0150 07/28/16 0302  08/07/16 0407 08/08/16 0308 08/08/16 0804 08/08/16 2300 08/29/16  WBC 10.6* 10.4 11.8*  < > 14.7* 10.2  --  9.5 8.5  NEUTROABS 8.1* 7.8* 9.4*  --   --   --   --   --   --   HGB 8.9* 8.3* 8.0*  < > 9.0* 8.2* 8.7* 8.0* 9.1*  HCT 29.1* 27.3* 26.2*  < >  30.1* 28.5* 29.7* 26.7* 30*  MCV 89.0 89.5 90.3  < > 90.7 91.3  --  90.5  --   PLT 326 331 308  < > 290 262  --  260 288  < > = values in this interval not displayed. Lipid Panel:  Recent Labs  03/19/16 1534  HDL 36*   Cardiac Enzymes:  Recent Labs  08/08/16 2300 08/09/16 0546  CKTOTAL 11*  --   CKMB 3.4  --   TROPONINI 0.06* 0.06*   CBG:  Recent Labs  08/08/16 1610 08/08/16 1646 08/08/16 2235  GLUCAP 187* 195* 140*      ASSESSMENT/PLAN:  Left halux wound infection  - S/P angioplasty and stenting LLE and S/P left great toe amputation; underwent excisional debridement on 2/6 and repeat I&D on 2/8, then had wound vac placement; discontinue Keflex and continue Cipro, start ampicillin 500 mg 1 capsule PO Q 6 hours X 10 days and to extend Florastor 250 mg 1 capsule PO BID  till 09/25/16  Atrial Fibrillation - rate controlled; continue Metoprolol tartrate 25 mg 1 tab PO BID and Coumadin  Long-term use of anticoagulant - INR 3.3, supra-therapeutic; Hold Coumadin and re-check INR on 09/13/16    Kimbra Marcelino C. [place] - NP    [employer] [phone]

## 2016-09-15 ENCOUNTER — Ambulatory Visit (HOSPITAL_COMMUNITY)
Admission: RE | Admit: 2016-09-15 | Discharge: 2016-09-15 | Disposition: A | Discharge status: Home / Self Care | Payer: No Typology Code available for payment source | Source: Ambulatory Visit | Attending: Surgery | Admitting: Surgery

## 2016-09-15 ENCOUNTER — Ambulatory Visit (INDEPENDENT_AMBULATORY_CARE_PROVIDER_SITE_OTHER): Payer: Self-pay | Admitting: Surgery

## 2016-09-15 ENCOUNTER — Ambulatory Visit (INDEPENDENT_AMBULATORY_CARE_PROVIDER_SITE_OTHER)
Admission: RE | Admit: 2016-09-15 | Discharge: 2016-09-15 | Disposition: A | Discharge status: Home / Self Care | Payer: No Typology Code available for payment source | Source: Ambulatory Visit | Attending: Surgery | Admitting: Surgery

## 2016-09-15 ENCOUNTER — Encounter (HOSPITAL_COMMUNITY): Payer: Self-pay

## 2016-09-15 ENCOUNTER — Encounter: Payer: Self-pay | Admitting: Surgery

## 2016-09-15 ENCOUNTER — Encounter: Payer: Medicare Other | Admitting: Internal Medicine

## 2016-09-15 VITALS — BP 105/52 | HR 62 | Temp 97.5°F | Resp 18 | Ht 70.0 in | Wt 169.0 lb

## 2016-09-15 DIAGNOSIS — Z95820 Peripheral vascular angioplasty status with implants and grafts: Secondary | ICD-10-CM | POA: Diagnosis not present

## 2016-09-15 DIAGNOSIS — I779 Disorder of arteries and arterioles, unspecified: Secondary | ICD-10-CM

## 2016-09-15 DIAGNOSIS — Z89422 Acquired absence of other left toe(s): Secondary | ICD-10-CM | POA: Diagnosis not present

## 2016-09-15 DIAGNOSIS — Z89412 Acquired absence of left great toe: Secondary | ICD-10-CM | POA: Insufficient documentation

## 2016-09-15 DIAGNOSIS — I739 Peripheral vascular disease, unspecified: Secondary | ICD-10-CM

## 2016-09-15 NOTE — Progress Notes (Signed)
[facility] [place]  Patient name: [name]. MRN: [id number] DOB: [birth year] Sex: male   REASON FOR VISIT:    Follow up  HISOTRY OF PRESENT ILLNESS:   The patient was seen earlier this year with a right heel ulcer x2 as well as a breakdown of the toe amputation site at the left great toe. He has previously undergone left superficial femoral artery stenting and subintimal recanalization of an occluded left anterior tibial artery with balloon angioplasty in 2011. This was done in the setting of an ischemic left toe ulcer which ultimately required left great toe amputation. In May of 2014, he underwent successful recanalization of an occluded right anterior tibial artery with subsequent balloon angioplasty using a 3 mm balloon. He was brought back several weeks later to address a high-grade stenosis vs. occlusion of his left superficial femoral artery stent. He underwent successful stenting of a nearly occluded left superficial femoral artery stent using a 7 x 1 20 self-expanding stent. This was done in June of 2014. He was lost to follow-up.  He presented to the hospital in early 2018 with left foot osteomyelitis.  On 07/16/2016 he underwent balloon angioplasty of left anterior tibial artery and drug coated balloon angioplasty left superficial femoral artery, requiring pedal access and great toe amputation by Dr. Donzetta Matters.  .  On 07/31/2016 he went back to the operating room for further debridement.    He now comes in today from [facility].  He has a wound VAC on his left foot ulcer and a new area which has progressed on the lateral side left foot.  There is also concerns over right heel ulcer.   PAST MEDICAL HISTORY:   Past Medical History:  Diagnosis Date  . Aortal stenosis 2006   biophrostetic  . Arthritis   . Atrial fibrillation ([place])    onset after CABG--- coumadin management at [facility] clinic  . Carotid artery  disease ([place]) 09/27/2010   Carotid US 12/17: bilat ICA 40-59 >> 1 year FU  . CHF (congestive heart failure) ([place])    abter CABG  . Complete heart block ([place])   . Coronary artery disease   . DM type 2 (diabetes mellitus, type 2) ([place])    used to see Dr Meredith Pel, now f/u by Dr Larose Kells  . Erectile dysfunction   . Gangrene ([place])   . History of nephrolithiasis   . HTN (hypertension)   . Hyperlipidemia   . Memory loss   . Obesity   . OSA (obstructive sleep apnea)    not using CPAP  . Pacemaker 2/07   VVI  . PVD (peripheral vascular disease) ([name])    CT angio showed- vascular insuff, intestine and RAS bilaterally, andgiogram 02/2009 severe PVD medical management  . S/P aortic valve replacement   . Sick sinus syndrome ([name])    [name]  . Skin cancer    surgery (nose) 09-2010     FAMILY HISTORY:   Family History  Problem Relation Age of Onset  . Heart attack Father 35  . Heart disease Father     Before age 48  . Hypertension Father   . Stroke Mother 54  . Cancer Sister     Cervical  . Heart attack Sister   . Heart disease Sister   . Diabetes Sister     2 sister w/ DM  . Heart attack Sister   . Heart attack Sister   . Heart disease Sister 28  AAA  and  Stomach Aneurysm  . AAA (abdominal aortic aneurysm) Sister   . Heart disease Sister     Before age 63  . Heart attack Sister 83  . Colon cancer Neg Hx   . Prostate cancer Neg Hx     SOCIAL HISTORY:   Social History  Substance Use Topics  . Smoking status: Former Smoker    Types: Cigarettes    Quit date: 06/23/1988  . Smokeless tobacco: Former Systems developer  . Alcohol use No     ALLERGIES:   Allergies  Allergen Reactions  . Potassium-Containing Compounds Anaphylaxis    IV--loss of conciousness  . Metoprolol Succinate Other (See Comments)    Extremely tired     CURRENT MEDICATIONS:   Current Outpatient Prescriptions  Medication Sig Dispense Refill  . acetaminophen (TYLENOL) 325 MG tablet Take 650 mg by  mouth 3 (three) times daily. 6AM, 2PM, 10PM    . albuterol (PROVENTIL) (2.5 MG/3ML) 0.083% nebulizer solution Take 3 mLs (2.5 mg total) by nebulization every 4 (four) hours as needed for wheezing or shortness of breath. 75 mL 12  . ampicillin (PRINCIPEN) 500 MG capsule Take 500 mg by mouth 4 (four) times daily. x7 days    . aspirin EC 81 MG EC tablet Take 1 tablet (81 mg total) by mouth daily.    . cephALEXin (KEFLEX) 500 MG capsule Take 500 mg by mouth 3 (three) times daily.    . ciprofloxacin (CIPRO) 500 MG tablet Take 500 mg by mouth 2 (two) times daily.    . diphenhydrAMINE (BENADRYL) 25 MG tablet Take 25 mg by mouth every 8 (eight) hours.    . docusate sodium (COLACE) 100 MG capsule Take 1 capsule (100 mg total) by mouth 2 (two) times daily. 10 capsule 0  . donepezil (ARICEPT) 10 MG tablet Take 1 tablet (10 mg total) by mouth at bedtime. 30 tablet 11  . ezetimibe-simvastatin (VYTORIN) 10-40 MG per tablet Take 1 tablet by mouth at bedtime. 30 tablet 3  . ferrous sulfate 325 (65 FE) MG tablet Take 1 tablet (325 mg total) by mouth 2 (two) times daily with a meal.  3  . furosemide (LASIX) 40 MG tablet Take 1 tablet (40 mg total) by mouth 2 (two) times daily. 30 tablet 2  . insulin glargine (LANTUS) 100 UNIT/ML injection Inject 0.05 mLs (5 Units total) into the skin at bedtime. 10 mL 11  . insulin lispro (HUMALOG) 100 UNIT/ML injection Inject 0-9 Units into the skin 3 (three) times daily with meals. <70 initiate hypoglycemic protocol, 70-120 = 0 units, 121-150 = 1 unit, 151-200 = 2 units, 201-250 = 3 units, 251-300 = 5 units, 301-350 = 7 units, 351-400 = 9 units, >400 call MD/NP    . ipratropium (ATROVENT) 0.02 % nebulizer solution Take 2.5 mLs (0.5 mg total) by nebulization every 4 (four) hours as needed for wheezing or shortness of breath. 75 mL 12  . metoprolol tartrate (LOPRESSOR) 25 MG tablet Take 1 tablet (25 mg total) by mouth 2 (two) times daily.    . Multiple Vitamins-Minerals (DECUBI-VITE)  CAPS Take 1 capsule by mouth daily.    Marland Kitchen nystatin (MYCOSTATIN/NYSTOP) powder Apply topically 3 (three) times daily. Apply to groin for irritation    . pantoprazole (PROTONIX) 40 MG tablet Take 1 tablet (40 mg total) by mouth 2 (two) times daily.    . potassium chloride SA (K-DUR,KLOR-CON) 20 MEQ tablet Take 20 mEq by mouth 2 (two) times daily.    Marland Kitchen  QUEtiapine (SEROQUEL) 50 MG tablet Take 50 mg by mouth at bedtime.    . saccharomyces boulardii (FLORASTOR) 250 MG capsule Take 250 mg by mouth 2 (two) times daily.    Marland Kitchen UNABLE TO FIND Apply 1 mL topically 2 (two) times daily. Med Name: Ativan/benadryl/haldol gel (1:25:1 mg/mL).  Apply 1 mL BID for anxiety/paranoia    . warfarin (COUMADIN) 4 MG tablet Take 4 mg by mouth daily.     No current facility-administered medications for this visit.     REVIEW OF SYSTEMS:   [X]  denotes positive finding, [ ]  denotes negative finding Cardiac  Comments:  Chest pain or chest pressure:    Shortness of breath upon exertion:    Short of breath when lying flat:    Irregular heart rhythm:        Vascular    Pain in calf, thigh, or hip brought on by ambulation:    Pain in feet at night that wakes you up from your sleep:     Blood clot in your veins:    Leg swelling:         Pulmonary    Oxygen at home:    Productive cough:     Wheezing:         Neurologic    Sudden weakness in arms or legs:     Sudden numbness in arms or legs:     Sudden onset of difficulty speaking or slurred speech:    Temporary loss of vision in one eye:     Problems with dizziness:         Gastrointestinal    Blood in stool:     Vomited blood:         Genitourinary    Burning when urinating:     Blood in urine:        Psychiatric    Major depression:         Hematologic    Bleeding problems:    Problems with blood clotting too easily:        Skin    Rashes or ulcers:        Constitutional    Fever or chills:      PHYSICAL EXAM:   Vitals:   09/15/16 0927    BP: (!) 105/52  Pulse: 62  Resp: 18  SpO2: 100%  Weight: 169 lb (76.7 kg)  Height: 5\' 10"  (7.412 m)    GENERAL: The patient is a well-nourished male, in no acute distress. The vital signs are documented above. CARDIAC: There is a regular rate and rhythm.  VASCULAR: pulses not palpable in feet PULMONARY: Non-labored respirations ABDOMEN: Soft and non-tender with normal pitched bowel sounds.  MUSCULOSKELETAL: There are no major deformities or cyanosis. NEUROLOGIC: No focal weakness or paresthesias are detected. SKIN: To 2 cm ulcer on the dorsum of the left foot which communicates with his toe amputation site.  This area was debrided including subcutaneous tissue.  There was a foul-smelling odor.  No skin erythema.  He does have a dry eschar over the lateral side was left foot.  He also has a right heel ulcer with dry eschar. PSYCHIATRIC: The patient has a normal affect.  STUDIES:   Vascular lab studies were performed today which I have reviewed and discussed with the technologist.  All the patient's previous interventions remained open without any significant stenosis identified.  He has diffuse disease throughout  MEDICAL ISSUES:   Based on the vascular lab studies today, I feel  the patient has exhausted options for revascularization.  At this point, he is left with wound care.  It does appear that his wounds are not improving.  There is no acute indication for amputation (likely above-knee amputation).  I discussed with the patient that if his wounds get worse that that would be the next step.  He is scheduled for follow-up in 1-2 months or sooner if his wounds deteriorate.    Annamarie Major, MD [facility] [phone] Pager [phone]

## 2016-09-17 ENCOUNTER — Ambulatory Visit (INDEPENDENT_AMBULATORY_CARE_PROVIDER_SITE_OTHER): Payer: Medicare Other | Admitting: Internal Medicine

## 2016-09-17 ENCOUNTER — Encounter: Payer: Self-pay | Admitting: Internal Medicine

## 2016-09-17 VITALS — BP 130/70 | HR 70 | Wt 170.0 lb

## 2016-09-17 DIAGNOSIS — I779 Disorder of arteries and arterioles, unspecified: Secondary | ICD-10-CM

## 2016-09-17 DIAGNOSIS — I482 Chronic atrial fibrillation: Secondary | ICD-10-CM

## 2016-09-17 DIAGNOSIS — Z950 Presence of cardiac pacemaker: Secondary | ICD-10-CM

## 2016-09-17 DIAGNOSIS — I4821 Permanent atrial fibrillation: Secondary | ICD-10-CM

## 2016-09-17 DIAGNOSIS — I442 Atrioventricular block, complete: Secondary | ICD-10-CM | POA: Diagnosis not present

## 2016-09-17 NOTE — Patient Instructions (Signed)
Medication Instructions:  Your physician recommends that you continue on your current medications as directed. Please refer to the Current Medication list given to you today.   Labwork: None ordered None ordered   Testing/Procedures: None ordered     Follow-Up:   Your physician recommends that you schedule a follow-up appointment in: 3 months with Richardson Dopp, PA    Remote monitoring is used to monitor your Pacemaker of ICD from home. This monitoring reduces the number of office visits required to check your device to one time per year. It allows [place] to keep an eye on the functioning of your device to ensure it is working properly. You are scheduled for a device check from home on 12/17/16. You may send your transmission at any time that day. If you have a wireless device, the transmission will be sent automatically. After your physician reviews your transmission, you will receive a postcard with your next transmission date.   Your physician wants you to follow-up in: 12 months with Chanetta Marshall, NP You will receive a reminder letter in the mail two months in advance. If you don't receive a letter, please call our office to schedule the follow-up appointment.     Any Other Special Instructions Will Be Listed Below (If Applicable).     If you need a refill on your cardiac medications before your next appointment, please call your pharmacy.

## 2016-09-17 NOTE — Progress Notes (Signed)
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 is a 82 y.o. male who presents today for routine electrophysiology followup.  He is s/p PPM generator change by me in December.  He has done well from an EP standpoint and device has healed.  He has significant difficulty with L foot wound healing and is now in a nursing home as he cannot walk.  He is very ill appearing chronically.  Today, he denies symptoms of palpitations, chest pain, shortness of breath,  dizziness, presyncope, or syncope. + dependant edema  The patient is otherwise without complaint today.   Past Medical History:  Diagnosis Date  . Aortal stenosis 2006   biophrostetic  . Arthritis   . Atrial fibrillation ([place])    onset after CABG--- coumadin management [facility]  . Carotid artery disease ([place]) 09/27/2010   Carotid US 12/17: bilat ICA 40-59 >> 1 year FU  . CHF (congestive heart failure) ([place])    abter CABG  . Complete heart block ([place])   . Coronary artery disease   . DM type 2 (diabetes mellitus, type 2) ([place])    used to see Dr Meredith Pel, now f/u by Dr Larose Kells  . Erectile dysfunction   . Gangrene ([place])   . History of nephrolithiasis   . HTN (hypertension)   . Hyperlipidemia   . Memory loss   . Obesity   . OSA (obstructive sleep apnea)    not using CPAP  . Pacemaker 2/07   VVI  . PVD (peripheral vascular disease) ([name])    CT angio showed- vascular insuff, intestine and RAS bilaterally, andgiogram 02/2009 severe PVD medical management  . S/P aortic valve replacement   . Sick sinus syndrome ([name])    [name]  . Skin cancer    surgery (nose) 09-2010   Past Surgical History:  Procedure Laterality Date  . ABDOMINAL AORTAGRAM N/A 11/10/2012   Procedure: ABDOMINAL Maxcine Ham;  Surgeon: Serafina Mitchell, MD;  Location: [facility] CATH LAB;  Service: Cardiovascular;  Laterality: N/A;  . AMPUTATION  01/2010   great toe  . AMPUTATION Left 07/16/2016   Procedure: RECECTION OF LEFT GREAT TOE  METATARSAL HEAD and sesamoid;  Surgeon: Waynetta Sandy, MD;  Location: [place];  Service: Vascular;  Laterality: Left;  . AMPUTATION Left 07/31/2016   Procedure: SECOND TOE AMPUTATION;  Surgeon: Serafina Mitchell, MD;  Location: [place] OR;  Service: Vascular;  Laterality: Left;  . ANGIOPLASTY / STENTING FEMORAL  02/10/10   Left femoral  . ANGIOPLASTY ILLIAC ARTERY Left 07/16/2016   Procedure: drug coated Balloon ANGIOPLASTY of left superficial femoral artry stent;  Surgeon: Waynetta Sandy, MD;  Location: [place];  Service: Vascular;  Laterality: Left;  . AORTIC VALVE REPLACEMENT     biophrostetic for Ao Stenosis 2006  . AORTIC VALVE REPLACEMENT  01/17/2011   S/P redo median sternotomy, extracorporeal circulation, redo Aortic Valve Replacement using a 23-mm Edwards pericardial Magna-Ease valve, Dr Ulla Gallo  . AORTOGRAM N/A 07/16/2016   Procedure: left lower extremity angiogram;  Surgeon: Waynetta Sandy, MD;  Location: [place];  Service: Vascular;  Laterality: N/A;  . APPLICATION OF WOUND VAC Left 07/31/2016   Procedure: APPLICATION OF WOUND VAC;  Surgeon: Serafina Mitchell, MD;  Location: [place];  Service: Vascular;  Laterality: Left;  . CATARACT EXTRACTION, BILATERAL    . CORONARY ARTERY BYPASS GRAFT  2006  . EP IMPLANTABLE DEVICE N/A 06/06/2016   Procedure: PPM  Nature conservation officer;  Surgeon: Thompson Grayer, MD;  Location: [place] CV LAB;  Service: Cardiovascular;  Laterality: N/A;  . ESOPHAGOGASTRODUODENOSCOPY (EGD) WITH PROPOFOL N/A 08/07/2016   Procedure: ESOPHAGOGASTRODUODENOSCOPY (EGD) WITH PROPOFOL;  Surgeon: Mauri Pole, MD;  Location: [place] [facility];  Service: Endoscopy;  Laterality: N/A;  . EYE SURGERY    . ILIAC ATHERECTOMY Left 07/16/2016   Procedure: Angioplasty and ATHERECTOMY of left anterior tibial artery;  Surgeon: Waynetta Sandy, MD;  Location: [place];  Service: Vascular;  Laterality: Left;  . INCISE AND DRAIN ABCESS Right May 2014   right heel     . LOWER EXTREMITY ANGIOGRAM Left 11/23/2012   Procedure: LOWER EXTREMITY ANGIOGRAM;  Surgeon: Serafina Mitchell, MD;  Location: [facility] CATH LAB;  Service: Cardiovascular;  Laterality: Left;  . PACEMAKER PLACEMENT  07/2005   Medtronic Sigma SR implanted by Dr Olevia Perches  . PERIPHERAL VASCULAR CATHETERIZATION N/A 07/10/2016   Procedure: Abdominal Aortogram;  Surgeon: Conrad [place], MD;  Location: [place] CV LAB;  Service: Cardiovascular;  Laterality: N/A;  . PERIPHERAL VASCULAR CATHETERIZATION Bilateral 07/10/2016   Procedure: Lower Extremity Angiography;  Surgeon: Conrad Buhler, MD;  Location: [place] CV LAB;  Service: Cardiovascular;  Laterality: Bilateral;  . TONSILLECTOMY    . ULTRASOUND GUIDANCE FOR VASCULAR ACCESS Bilateral 07/16/2016   Procedure: ULTRASOUND GUIDANCE FOR VASCULAR ACCESS of right common femoral and left dorsalis pedis;  Surgeon: Waynetta Sandy, MD;  Location: [facility] OR;  Service: Vascular;  Laterality: Bilateral;  . WOUND DEBRIDEMENT Left 07/31/2016   Procedure: DEBRIDEMENT GREAT TOE AMPUTATION SITE;  Surgeon: Serafina Mitchell, MD;  Location: [facility] OR;  Service: Vascular;  Laterality: Left;    ROS- all systems are reviewed and negative except as per HPI above  Current Outpatient Prescriptions  Medication Sig Dispense Refill  . acetaminophen (TYLENOL) 325 MG tablet Take 650 mg by mouth 3 (three) times daily. 6AM, 2PM, 10PM    . albuterol (PROVENTIL) (2.5 MG/3ML) 0.083% nebulizer solution Take 3 mLs (2.5 mg total) by nebulization every 4 (four) hours as needed for wheezing or shortness of breath. 75 mL 12  . ampicillin (PRINCIPEN) 500 MG capsule Take 500 mg by mouth 4 (four) times daily. x7 days    . aspirin EC 81 MG EC tablet Take 1 tablet (81 mg total) by mouth daily.    . ciprofloxacin (CIPRO) 500 MG tablet Take 500 mg by mouth 2 (two) times daily.    . diphenhydrAMINE (BENADRYL) 25 MG tablet Take 25 mg by mouth every 8 (eight) hours.    . docusate sodium (COLACE) 100 MG capsule  Take 1 capsule (100 mg total) by mouth 2 (two) times daily. 10 capsule 0  . donepezil (ARICEPT) 10 MG tablet Take 1 tablet (10 mg total) by mouth at bedtime. 30 tablet 11  . ezetimibe-simvastatin (VYTORIN) 10-40 MG per tablet Take 1 tablet by mouth at bedtime. 30 tablet 3  . ferrous sulfate 325 (65 FE) MG tablet Take 1 tablet (325 mg total) by mouth 2 (two) times daily with a meal.  3  . furosemide (LASIX) 40 MG tablet Take 1 tablet (40 mg total) by mouth 2 (two) times daily. 30 tablet 2  . insulin glargine (LANTUS) 100 UNIT/ML injection Inject 0.05 mLs (5 Units total) into the skin at bedtime. 10 mL 11  . insulin lispro (HUMALOG) 100 UNIT/ML injection Inject 0-9 Units into the skin 3 (three) times daily with meals. <70 initiate hypoglycemic protocol, 70-120 = 0 units, 121-150 =  1 unit, 151-200 = 2 units, 201-250 = 3 units, 251-300 = 5 units, 301-350 = 7 units, 351-400 = 9 units, >400 call MD/NP    . ipratropium (ATROVENT) 0.02 % nebulizer solution Take 2.5 mLs (0.5 mg total) by nebulization every 4 (four) hours as needed for wheezing or shortness of breath. 75 mL 12  . metoprolol tartrate (LOPRESSOR) 25 MG tablet Take 1 tablet (25 mg total) by mouth 2 (two) times daily.    . Multiple Vitamins-Minerals (DECUBI-VITE) CAPS Take 1 capsule by mouth daily.    Marland Kitchen nystatin (MYCOSTATIN/NYSTOP) powder Apply topically 3 (three) times daily. Apply to groin for irritation    . pantoprazole (PROTONIX) 40 MG tablet Take 1 tablet (40 mg total) by mouth 2 (two) times daily.    . potassium chloride SA (K-DUR,KLOR-CON) 20 MEQ tablet Take 20 mEq by mouth 2 (two) times daily.    . QUEtiapine (SEROQUEL) 50 MG tablet Take 50 mg by mouth at bedtime.    Marland Kitchen UNABLE TO FIND Apply 1 mL topically 2 (two) times daily. Med Name: Ativan/benadryl/haldol gel (1:25:1 mg/mL).  Apply 1 mL BID for anxiety/paranoia    . warfarin (COUMADIN) 3 MG tablet Take 3 mg by mouth daily. As directed    . saccharomyces boulardii (FLORASTOR) 250 MG  capsule Take 250 mg by mouth 2 (two) times daily.     No current facility-administered medications for this visit.     Physical Exam: Vitals:   09/17/16 1432  BP: 130/70  Pulse: 70  SpO2: 98%  Weight: 170 lb (77.1 kg)    GEN- The patient is elderly and frail appearing, chronically ill, alert and oriented x 3 today.   Head- normocephalic, atraumatic Eyes-  Sclera clear, conjunctiva pink Ears- hearing intact Oropharynx- clear Lungs- Clear to ausculation bilaterally, normal work of breathing Chest- pacemaker pocket is well healed Heart- Regular rate and rhythm (paced) GI- soft, NT, ND, + BS Extremities- no clubbing, cyanosis, + dependant edema,  L foot is bandaged  Pacemaker interrogation- personally reviewed in detail today,  See PACEART report ekg today reveals afib with V pacing  Assessment and Plan:  1. Complete heart block Normal pacemaker function See Pace Art report No changes today I worry about his poor wound healed and risks for bacteremia/ device infection in the future.  The importance of close follow-up with Dr Trula Slade was stressed today.  2. Permanent afib Continue long term anticoagulation  Follow-up with Dr McLean/ general cardiology team in 3 months carelink Return to see EP NP every year  Thompson Grayer MD, [facility] 09/17/2016 3:06 PM

## 2016-09-18 DIAGNOSIS — L89159 Pressure ulcer of sacral region, unspecified stage: Secondary | ICD-10-CM | POA: Diagnosis not present

## 2016-09-18 DIAGNOSIS — M25511 Pain in right shoulder: Secondary | ICD-10-CM | POA: Diagnosis not present

## 2016-09-18 DIAGNOSIS — R2681 Unsteadiness on feet: Secondary | ICD-10-CM | POA: Diagnosis not present

## 2016-09-18 DIAGNOSIS — S91302D Unspecified open wound, left foot, subsequent encounter: Secondary | ICD-10-CM | POA: Diagnosis not present

## 2016-09-18 DIAGNOSIS — S42294A Other nondisplaced fracture of upper end of right humerus, initial encounter for closed fracture: Secondary | ICD-10-CM | POA: Diagnosis not present

## 2016-09-18 DIAGNOSIS — R5381 Other malaise: Secondary | ICD-10-CM | POA: Diagnosis not present

## 2016-09-18 DIAGNOSIS — I739 Peripheral vascular disease, unspecified: Secondary | ICD-10-CM | POA: Diagnosis not present

## 2016-09-18 DIAGNOSIS — S91301S Unspecified open wound, right foot, sequela: Secondary | ICD-10-CM | POA: Diagnosis not present

## 2016-09-18 LAB — CUP PACEART INCLINIC DEVICE CHECK
Battery Remaining Longevity: 129 mo
Implantable Lead Implant Date: 20070206
Implantable Lead Location: 753860
Implantable Lead Model: 5076
Implantable Pulse Generator Implant Date: 20171215
Lead Channel Pacing Threshold Amplitude: 0.75 V
Lead Channel Pacing Threshold Pulse Width: 0.4 ms
Lead Channel Sensing Intrinsic Amplitude: 13.75 mV
Lead Channel Setting Pacing Pulse Width: 0.4 ms
MDC IDC MSMT BATTERY VOLTAGE: 3.05 V
MDC IDC MSMT LEADCHNL RV IMPEDANCE VALUE: 380 Ohm
MDC IDC MSMT LEADCHNL RV IMPEDANCE VALUE: 475 Ohm
MDC IDC SESS DTM: 20180328183928
MDC IDC SET LEADCHNL RV PACING AMPLITUDE: 2.5 V
MDC IDC SET LEADCHNL RV SENSING SENSITIVITY: 5.6 mV
MDC IDC STAT BRADY RV PERCENT PACED: 93.52 %

## 2016-09-19 ENCOUNTER — Emergency Department (HOSPITAL_COMMUNITY): Payer: Medicare Other

## 2016-09-19 ENCOUNTER — Encounter (HOSPITAL_COMMUNITY): Payer: Self-pay | Admitting: Family Medicine

## 2016-09-19 ENCOUNTER — Inpatient Hospital Stay (HOSPITAL_COMMUNITY)
Admission: EM | Admit: 2016-09-19 | Discharge: 2016-10-21 | DRG: 917 | Disposition: E | Payer: Medicare Other | Attending: Pulmonary Disease | Admitting: Pulmonary Disease

## 2016-09-19 DIAGNOSIS — E785 Hyperlipidemia, unspecified: Secondary | ICD-10-CM | POA: Diagnosis present

## 2016-09-19 DIAGNOSIS — L8990 Pressure ulcer of unspecified site, unspecified stage: Secondary | ICD-10-CM | POA: Insufficient documentation

## 2016-09-19 DIAGNOSIS — F0390 Unspecified dementia without behavioral disturbance: Secondary | ICD-10-CM | POA: Diagnosis present

## 2016-09-19 DIAGNOSIS — Z01818 Encounter for other preprocedural examination: Secondary | ICD-10-CM

## 2016-09-19 DIAGNOSIS — Z9981 Dependence on supplemental oxygen: Secondary | ICD-10-CM

## 2016-09-19 DIAGNOSIS — R451 Restlessness and agitation: Secondary | ICD-10-CM

## 2016-09-19 DIAGNOSIS — I482 Chronic atrial fibrillation, unspecified: Secondary | ICD-10-CM | POA: Diagnosis present

## 2016-09-19 DIAGNOSIS — Z66 Do not resuscitate: Secondary | ICD-10-CM

## 2016-09-19 DIAGNOSIS — I2510 Atherosclerotic heart disease of native coronary artery without angina pectoris: Secondary | ICD-10-CM | POA: Diagnosis present

## 2016-09-19 DIAGNOSIS — R0902 Hypoxemia: Secondary | ICD-10-CM

## 2016-09-19 DIAGNOSIS — E1152 Type 2 diabetes mellitus with diabetic peripheral angiopathy with gangrene: Secondary | ICD-10-CM | POA: Diagnosis present

## 2016-09-19 DIAGNOSIS — Z833 Family history of diabetes mellitus: Secondary | ICD-10-CM

## 2016-09-19 DIAGNOSIS — N179 Acute kidney failure, unspecified: Secondary | ICD-10-CM | POA: Diagnosis present

## 2016-09-19 DIAGNOSIS — G92 Toxic encephalopathy: Secondary | ICD-10-CM | POA: Diagnosis present

## 2016-09-19 DIAGNOSIS — R4182 Altered mental status, unspecified: Secondary | ICD-10-CM | POA: Diagnosis not present

## 2016-09-19 DIAGNOSIS — Z4659 Encounter for fitting and adjustment of other gastrointestinal appliance and device: Secondary | ICD-10-CM

## 2016-09-19 DIAGNOSIS — I5033 Acute on chronic diastolic (congestive) heart failure: Secondary | ICD-10-CM | POA: Diagnosis present

## 2016-09-19 DIAGNOSIS — J9611 Chronic respiratory failure with hypoxia: Secondary | ICD-10-CM | POA: Diagnosis present

## 2016-09-19 DIAGNOSIS — Z89422 Acquired absence of other left toe(s): Secondary | ICD-10-CM

## 2016-09-19 DIAGNOSIS — J690 Pneumonitis due to inhalation of food and vomit: Secondary | ICD-10-CM | POA: Diagnosis not present

## 2016-09-19 DIAGNOSIS — L98429 Non-pressure chronic ulcer of back with unspecified severity: Secondary | ICD-10-CM | POA: Diagnosis present

## 2016-09-19 DIAGNOSIS — Z89412 Acquired absence of left great toe: Secondary | ICD-10-CM

## 2016-09-19 DIAGNOSIS — Z9889 Other specified postprocedural states: Secondary | ICD-10-CM

## 2016-09-19 DIAGNOSIS — G4733 Obstructive sleep apnea (adult) (pediatric): Secondary | ICD-10-CM | POA: Diagnosis present

## 2016-09-19 DIAGNOSIS — Z515 Encounter for palliative care: Secondary | ICD-10-CM

## 2016-09-19 DIAGNOSIS — E43 Unspecified severe protein-calorie malnutrition: Secondary | ICD-10-CM | POA: Insufficient documentation

## 2016-09-19 DIAGNOSIS — I130 Hypertensive heart and chronic kidney disease with heart failure and stage 1 through stage 4 chronic kidney disease, or unspecified chronic kidney disease: Secondary | ICD-10-CM | POA: Diagnosis present

## 2016-09-19 DIAGNOSIS — L98419 Non-pressure chronic ulcer of buttock with unspecified severity: Secondary | ICD-10-CM | POA: Diagnosis present

## 2016-09-19 DIAGNOSIS — E1165 Type 2 diabetes mellitus with hyperglycemia: Secondary | ICD-10-CM | POA: Diagnosis present

## 2016-09-19 DIAGNOSIS — Z888 Allergy status to other drugs, medicaments and biological substances status: Secondary | ICD-10-CM

## 2016-09-19 DIAGNOSIS — Z950 Presence of cardiac pacemaker: Secondary | ICD-10-CM

## 2016-09-19 DIAGNOSIS — E876 Hypokalemia: Secondary | ICD-10-CM | POA: Diagnosis not present

## 2016-09-19 DIAGNOSIS — Z87891 Personal history of nicotine dependence: Secondary | ICD-10-CM

## 2016-09-19 DIAGNOSIS — I509 Heart failure, unspecified: Secondary | ICD-10-CM | POA: Diagnosis not present

## 2016-09-19 DIAGNOSIS — N183 Chronic kidney disease, stage 3 (moderate): Secondary | ICD-10-CM | POA: Diagnosis present

## 2016-09-19 DIAGNOSIS — E1122 Type 2 diabetes mellitus with diabetic chronic kidney disease: Secondary | ICD-10-CM | POA: Diagnosis present

## 2016-09-19 DIAGNOSIS — M1990 Unspecified osteoarthritis, unspecified site: Secondary | ICD-10-CM | POA: Diagnosis present

## 2016-09-19 DIAGNOSIS — G459 Transient cerebral ischemic attack, unspecified: Secondary | ICD-10-CM

## 2016-09-19 DIAGNOSIS — I272 Pulmonary hypertension, unspecified: Secondary | ICD-10-CM | POA: Diagnosis present

## 2016-09-19 DIAGNOSIS — I998 Other disorder of circulatory system: Secondary | ICD-10-CM | POA: Diagnosis present

## 2016-09-19 DIAGNOSIS — E1151 Type 2 diabetes mellitus with diabetic peripheral angiopathy without gangrene: Secondary | ICD-10-CM | POA: Diagnosis present

## 2016-09-19 DIAGNOSIS — E11622 Type 2 diabetes mellitus with other skin ulcer: Secondary | ICD-10-CM | POA: Diagnosis present

## 2016-09-19 DIAGNOSIS — Z781 Physical restraint status: Secondary | ICD-10-CM

## 2016-09-19 DIAGNOSIS — E870 Hyperosmolality and hypernatremia: Secondary | ICD-10-CM | POA: Diagnosis present

## 2016-09-19 DIAGNOSIS — D500 Iron deficiency anemia secondary to blood loss (chronic): Secondary | ICD-10-CM | POA: Diagnosis present

## 2016-09-19 DIAGNOSIS — J810 Acute pulmonary edema: Secondary | ICD-10-CM

## 2016-09-19 DIAGNOSIS — I2699 Other pulmonary embolism without acute cor pulmonale: Secondary | ICD-10-CM

## 2016-09-19 DIAGNOSIS — J9621 Acute and chronic respiratory failure with hypoxia: Secondary | ICD-10-CM | POA: Diagnosis present

## 2016-09-19 DIAGNOSIS — T50901A Poisoning by unspecified drugs, medicaments and biological substances, accidental (unintentional), initial encounter: Secondary | ICD-10-CM | POA: Diagnosis present

## 2016-09-19 DIAGNOSIS — T424X1A Poisoning by benzodiazepines, accidental (unintentional), initial encounter: Principal | ICD-10-CM | POA: Diagnosis present

## 2016-09-19 DIAGNOSIS — J90 Pleural effusion, not elsewhere classified: Secondary | ICD-10-CM | POA: Diagnosis present

## 2016-09-19 DIAGNOSIS — R109 Unspecified abdominal pain: Secondary | ICD-10-CM | POA: Diagnosis present

## 2016-09-19 DIAGNOSIS — Z952 Presence of prosthetic heart valve: Secondary | ICD-10-CM

## 2016-09-19 DIAGNOSIS — L89610 Pressure ulcer of right heel, unstageable: Secondary | ICD-10-CM | POA: Diagnosis present

## 2016-09-19 DIAGNOSIS — T43591A Poisoning by other antipsychotics and neuroleptics, accidental (unintentional), initial encounter: Secondary | ICD-10-CM | POA: Diagnosis not present

## 2016-09-19 DIAGNOSIS — D649 Anemia, unspecified: Secondary | ICD-10-CM | POA: Diagnosis present

## 2016-09-19 DIAGNOSIS — J811 Chronic pulmonary edema: Secondary | ICD-10-CM

## 2016-09-19 DIAGNOSIS — R0602 Shortness of breath: Secondary | ICD-10-CM

## 2016-09-19 DIAGNOSIS — IMO0002 Reserved for concepts with insufficient information to code with codable children: Secondary | ICD-10-CM | POA: Diagnosis present

## 2016-09-19 DIAGNOSIS — R55 Syncope and collapse: Secondary | ICD-10-CM | POA: Diagnosis present

## 2016-09-19 DIAGNOSIS — J9601 Acute respiratory failure with hypoxia: Secondary | ICD-10-CM

## 2016-09-19 DIAGNOSIS — I959 Hypotension, unspecified: Secondary | ICD-10-CM | POA: Diagnosis not present

## 2016-09-19 DIAGNOSIS — Z951 Presence of aortocoronary bypass graft: Secondary | ICD-10-CM

## 2016-09-19 DIAGNOSIS — Z794 Long term (current) use of insulin: Secondary | ICD-10-CM

## 2016-09-19 DIAGNOSIS — Z95820 Peripheral vascular angioplasty status with implants and grafts: Secondary | ICD-10-CM

## 2016-09-19 DIAGNOSIS — I10 Essential (primary) hypertension: Secondary | ICD-10-CM | POA: Diagnosis present

## 2016-09-19 DIAGNOSIS — Z7982 Long term (current) use of aspirin: Secondary | ICD-10-CM

## 2016-09-19 DIAGNOSIS — T450X1A Poisoning by antiallergic and antiemetic drugs, accidental (unintentional), initial encounter: Secondary | ICD-10-CM | POA: Diagnosis present

## 2016-09-19 DIAGNOSIS — D696 Thrombocytopenia, unspecified: Secondary | ICD-10-CM | POA: Diagnosis not present

## 2016-09-19 DIAGNOSIS — J449 Chronic obstructive pulmonary disease, unspecified: Secondary | ICD-10-CM | POA: Diagnosis present

## 2016-09-19 LAB — COMPREHENSIVE METABOLIC PANEL
ALBUMIN: 2.2 g/dL — AB (ref 3.5–5.0)
ALT: 59 U/L (ref 17–63)
AST: 50 U/L — AB (ref 15–41)
Alkaline Phosphatase: 220 U/L — ABNORMAL HIGH (ref 38–126)
Anion gap: 8 (ref 5–15)
BUN: 25 mg/dL — AB (ref 6–20)
CHLORIDE: 97 mmol/L — AB (ref 101–111)
CO2: 34 mmol/L — ABNORMAL HIGH (ref 22–32)
CREATININE: 1.29 mg/dL — AB (ref 0.61–1.24)
Calcium: 9.2 mg/dL (ref 8.9–10.3)
GFR calc non Af Amer: 51 mL/min — ABNORMAL LOW (ref >60)
GFR, EST AFRICAN AMERICAN: 59 mL/min — AB (ref >60)
Glucose, Bld: 112 mg/dL — ABNORMAL HIGH (ref 65–99)
Potassium: 4 mmol/L (ref 3.5–5.1)
SODIUM: 139 mmol/L (ref 135–145)
Total Bilirubin: 0.7 mg/dL (ref 0.3–1.2)
Total Protein: 6.7 g/dL (ref 6.5–8.1)

## 2016-09-19 LAB — CBC WITH DIFFERENTIAL/PLATELET
BASOS ABS: 0.1 10*3/uL (ref 0.0–0.1)
BASOS PCT: 0 %
EOS ABS: 0.2 10*3/uL (ref 0.0–0.7)
Eosinophils Relative: 1 %
HEMATOCRIT: 28.7 % — AB (ref 39.0–52.0)
HEMOGLOBIN: 7.9 g/dL — AB (ref 13.0–17.0)
Lymphocytes Relative: 31 %
Lymphs Abs: 3.9 10*3/uL (ref 0.7–4.0)
MCH: 24.1 pg — ABNORMAL LOW (ref 26.0–34.0)
MCHC: 27.5 g/dL — ABNORMAL LOW (ref 30.0–36.0)
MCV: 87.5 fL (ref 78.0–100.0)
Monocytes Absolute: 1.3 10*3/uL — ABNORMAL HIGH (ref 0.1–1.0)
Monocytes Relative: 10 %
NEUTROS PCT: 58 %
Neutro Abs: 7.5 10*3/uL (ref 1.7–7.7)
Platelets: 316 10*3/uL (ref 150–400)
RBC: 3.28 MIL/uL — AB (ref 4.22–5.81)
RDW: 17.9 % — ABNORMAL HIGH (ref 11.5–15.5)
WBC: 12.9 10*3/uL — AB (ref 4.0–10.5)

## 2016-09-19 LAB — I-STAT CHEM 8, ED
BUN: 33 mg/dL — AB (ref 6–20)
CREATININE: 1.5 mg/dL — AB (ref 0.61–1.24)
Calcium, Ion: 1.1 mmol/L — ABNORMAL LOW (ref 1.15–1.40)
Chloride: 96 mmol/L — ABNORMAL LOW (ref 101–111)
GLUCOSE: 111 mg/dL — AB (ref 65–99)
HEMATOCRIT: 27 % — AB (ref 39.0–52.0)
Hemoglobin: 9.2 g/dL — ABNORMAL LOW (ref 13.0–17.0)
Potassium: 4.2 mmol/L (ref 3.5–5.1)
Sodium: 138 mmol/L (ref 135–145)
TCO2: 39 mmol/L (ref 0–100)

## 2016-09-19 LAB — ACETAMINOPHEN LEVEL: Acetaminophen (Tylenol), Serum: <10 ug/mL — ABNORMAL LOW (ref 10–30)

## 2016-09-19 LAB — GLUCOSE, CAPILLARY: Glucose-Capillary: 112 mg/dL — ABNORMAL HIGH (ref 65–99)

## 2016-09-19 LAB — BLOOD GAS, ARTERIAL
Acid-Base Excess: 10.3 mmol/L — ABNORMAL HIGH (ref 0.0–2.0)
Bicarbonate: 35.8 mmol/L — ABNORMAL HIGH (ref 20.0–28.0)
Drawn by: 418751
O2 Content: 6 L/min
O2 Saturation: 96.5 %
PATIENT TEMPERATURE: 98.6
PH ART: 7.365 (ref 7.350–7.450)
PO2 ART: 85.2 mmHg (ref 83.0–108.0)
pCO2 arterial: 64.3 mmHg — ABNORMAL HIGH (ref 32.0–48.0)

## 2016-09-19 LAB — I-STAT CG4 LACTIC ACID, ED: LACTIC ACID, VENOUS: 1.64 mmol/L (ref 0.5–1.9)

## 2016-09-19 LAB — URINALYSIS, ROUTINE W REFLEX MICROSCOPIC
Bilirubin Urine: NEGATIVE
Glucose, UA: NEGATIVE mg/dL
HGB URINE DIPSTICK: NEGATIVE
Ketones, ur: NEGATIVE mg/dL
LEUKOCYTES UA: NEGATIVE
NITRITE: NEGATIVE
PROTEIN: NEGATIVE mg/dL
SPECIFIC GRAVITY, URINE: 1.015 (ref 1.005–1.030)
pH: 5 (ref 5.0–8.0)

## 2016-09-19 LAB — PROTIME-INR
INR: 2.21
PROTHROMBIN TIME: 24.9 s — AB (ref 11.4–15.2)

## 2016-09-19 LAB — CBG MONITORING, ED: Glucose-Capillary: 131 mg/dL — ABNORMAL HIGH (ref 65–99)

## 2016-09-19 LAB — TYPE AND SCREEN
ABO/RH(D): B POS
Antibody Screen: NEGATIVE

## 2016-09-19 LAB — SALICYLATE LEVEL: Salicylate Lvl: <7 mg/dL (ref 2.8–30.0)

## 2016-09-19 LAB — BRAIN NATRIURETIC PEPTIDE: B NATRIURETIC PEPTIDE 5: 581 pg/mL — AB (ref 0.0–100.0)

## 2016-09-19 MED ORDER — ACETAMINOPHEN 325 MG PO TABS: 650.0000 mg | ORAL_TABLET | Freq: Four times a day (QID) | ORAL | Status: DC | PRN

## 2016-09-19 MED ORDER — EZETIMIBE-SIMVASTATIN 10-40 MG PO TABS
1.0000 | ORAL_TABLET | Freq: Every day | ORAL | Status: DC
  Administered 2016-09-20: 1 via ORAL
  Filled 2016-09-19: qty 1

## 2016-09-19 MED ORDER — SODIUM CHLORIDE 0.9% FLUSH
3.0000 mL | Freq: Two times a day (BID) | INTRAVENOUS | Status: DC
  Administered 2016-09-19 – 2016-09-20 (×2): 3 mL via INTRAVENOUS

## 2016-09-19 MED ORDER — METOPROLOL TARTRATE 25 MG PO TABS
25.0000 mg | ORAL_TABLET | Freq: Two times a day (BID) | ORAL | Status: DC
Start: 2016-09-20 — End: 2016-09-21
  Administered 2016-09-20 (×2): 25 mg via ORAL
  Filled 2016-09-19 (×2): qty 1

## 2016-09-19 MED ORDER — SODIUM CHLORIDE 0.9 % IV SOLN
1.0000 g | Freq: Four times a day (QID) | INTRAVENOUS | Status: DC
  Administered 2016-09-19: 1 g via INTRAVENOUS
  Filled 2016-09-19 (×3): qty 1000

## 2016-09-19 MED ORDER — SODIUM CHLORIDE 0.9 % IV SOLN: 250.0000 mL | INTRAVENOUS | Status: DC | PRN

## 2016-09-19 MED ORDER — SODIUM CHLORIDE 0.9% FLUSH: 3.0000 mL | INTRAVENOUS | Status: DC | PRN

## 2016-09-19 MED ORDER — ONDANSETRON HCL 4 MG/2ML IJ SOLN: 4.0000 mg | Freq: Four times a day (QID) | INTRAMUSCULAR | Status: DC | PRN

## 2016-09-19 MED ORDER — INSULIN ASPART 100 UNIT/ML ~~LOC~~ SOLN
0.0000 [IU] | SUBCUTANEOUS | Status: DC
  Administered 2016-09-19 – 2016-09-22 (×4): 1 [IU] via SUBCUTANEOUS
  Filled 2016-09-19: qty 1

## 2016-09-19 MED ORDER — SODIUM CHLORIDE 0.9 % IV SOLN
3.0000 g | Freq: Four times a day (QID) | INTRAVENOUS | Status: AC
  Administered 2016-09-19 – 2016-09-24 (×20): 3 g via INTRAVENOUS
  Filled 2016-09-19 (×22): qty 3

## 2016-09-19 MED ORDER — ADULT MULTIVITAMIN W/MINERALS CH
1.0000 | ORAL_TABLET | Freq: Every day | ORAL | Status: DC
  Administered 2016-09-20: 1 via ORAL
  Filled 2016-09-19: qty 1

## 2016-09-19 MED ORDER — NYSTATIN 100000 UNIT/GM EX POWD
Freq: Two times a day (BID) | CUTANEOUS | Status: DC
  Administered 2016-09-20 – 2016-09-30 (×21): via TOPICAL
  Administered 2016-09-30: 1 via TOPICAL
  Administered 2016-10-01 – 2016-10-03 (×5): via TOPICAL
  Filled 2016-09-19 (×4): qty 15

## 2016-09-19 MED ORDER — PANTOPRAZOLE SODIUM 40 MG IV SOLR
40.0000 mg | Freq: Two times a day (BID) | INTRAVENOUS | Status: DC
  Administered 2016-09-19 – 2016-09-22 (×6): 40 mg via INTRAVENOUS
  Filled 2016-09-19 (×6): qty 40

## 2016-09-19 MED ORDER — FUROSEMIDE 10 MG/ML IJ SOLN
40.0000 mg | INTRAMUSCULAR | Status: AC
  Administered 2016-09-19: 40 mg via INTRAVENOUS
  Filled 2016-09-19: qty 4

## 2016-09-19 MED ORDER — DOCUSATE SODIUM 100 MG PO CAPS
100.0000 mg | ORAL_CAPSULE | Freq: Two times a day (BID) | ORAL | Status: DC
  Administered 2016-09-20 (×2): 100 mg via ORAL
  Filled 2016-09-19 (×2): qty 1

## 2016-09-19 MED ORDER — ACETAMINOPHEN 650 MG RE SUPP: 650.0000 mg | Freq: Four times a day (QID) | RECTAL | Status: DC | PRN

## 2016-09-19 MED ORDER — SACCHAROMYCES BOULARDII 250 MG PO CAPS
250.0000 mg | ORAL_CAPSULE | Freq: Two times a day (BID) | ORAL | Status: DC
  Administered 2016-09-20: 250 mg via ORAL
  Filled 2016-09-19: qty 1

## 2016-09-19 MED ORDER — INSULIN GLARGINE 100 UNIT/ML ~~LOC~~ SOLN
5.0000 [IU] | Freq: Every day | SUBCUTANEOUS | Status: DC
  Administered 2016-09-20: 5 [IU] via SUBCUTANEOUS
  Filled 2016-09-19 (×3): qty 0.05

## 2016-09-19 MED ORDER — IPRATROPIUM-ALBUTEROL 0.5-2.5 (3) MG/3ML IN SOLN
3.0000 mL | RESPIRATORY_TRACT | Status: DC | PRN
  Administered 2016-09-19: 3 mL via RESPIRATORY_TRACT
  Filled 2016-09-19: qty 3

## 2016-09-19 MED ORDER — DONEPEZIL HCL 10 MG PO TABS
10.0000 mg | ORAL_TABLET | Freq: Every day | ORAL | Status: DC
  Administered 2016-09-20: 10 mg via ORAL
  Filled 2016-09-19: qty 1

## 2016-09-19 MED ORDER — ONDANSETRON HCL 4 MG PO TABS: 4.0000 mg | ORAL_TABLET | Freq: Four times a day (QID) | ORAL | Status: DC | PRN

## 2016-09-19 NOTE — H&P (Signed)
History and Physical    [name]. KZS:[id number] DOB: 1935/04/07 DOA: 09/05/2016  PCP: [name], MD   Patient coming from: SNF  Chief Complaint: Accidental overdose   HPI: [name]. is a 82 y.o. male with medical history significant for COPD with chronic respiratory failure, chronic diastolic CHF, CAD status post CABG, chronic atrial fibrillation on Coumadin, intermittent agitation, and chronic wounds involving the left foot and sacrum who presents to the emergency department from his SNF for evaluation of somnolence following an accident abnormal overdose of Ativan, Haldol, and Benadryl. Patient reportedly is treated at his nursing facility with a compounded topical cream containing 1 mg Ativan, 1 mg Haldol, and 25 mg Benadryl per dose. He typically receives this twice daily as needed for agitation, but was inadvertently given a 10x dose by a new nurse today, and it was administered orally rather than topically. Patient was noted to be increasingly somnolent at the nursing facility and EMS was called for transport to the hospital for evaluation of this. Per the report of his family at the bedside, he had begun to show good improvement at the nursing facility after being discharged from the hospital on 08/09/2016 after nonoperative management of right humerus fracture, treatment of bleeding ulcer with endoscopic clipping and 4 units of packed red blood cells, and drainage of a right pleural effusion.   ED Course: Upon arrival to the ED, patient is found to be afebrile, saturating adequately on 3 L per minute supplemental oxygen, mildly tachypneic, and with vitals otherwise stable. EKG featured a ventricularly paced rhythm, chest x-ray was consistent with CHF with a moderate to large left-sided pleural effusion. Mr. panel is notable for a BUN of 25 and serum creatinine 1.29, up from a recent value of 0.9. CBC is notable for a leukocytosis to 12,900 and a normocytic anemia with  hemoglobin 7.9. Lactic acid is reassuring at 1.64. Patient was treated with 40 mg of IV Lasix in the emergency department and case was discussed with poison control. Poison control recommended telemetry monitoring with supportive care. Patient remained hemodynamically stable in the ED, but has been in some respiratory distress and appears uncomfortable. He will be observed on the telemetry unit for ongoing evaluation and management of unintentional overdose and Ativan, Haldol, and Benadryl, as well as acute on chronic hypoxic respiratory failure with mod-large left-sided pleural effusion.  Review of Systems:  All other systems reviewed and apart from HPI, are negative.  Past Medical History:  Diagnosis Date  . Aortal stenosis 2006   biophrostetic  . Arthritis   . Atrial fibrillation ([place])    onset after CABG--- coumadin management [facility]  . Carotid artery disease ([place]) 09/27/2010   Carotid US 12/17: bilat ICA 40-59 >> 1 year FU  . CHF (congestive heart failure) ([place])    abter CABG  . Complete heart block ([place])   . Coronary artery disease   . DM type 2 (diabetes mellitus, type 2) ([place])    used to see Dr Meredith Pel, now f/u by Dr Larose Kells  . Erectile dysfunction   . Gangrene ([place])   . History of nephrolithiasis   . HTN (hypertension)   . Hyperlipidemia   . Memory loss   . Obesity   . OSA (obstructive sleep apnea)    not using CPAP  . Pacemaker 2/07   VVI  . PVD (peripheral vascular disease) ([place])    CT angio showed- vascular insuff, intestine and RAS bilaterally, andgiogram 02/2009  severe PVD medical management  . S/P aortic valve replacement   . Sick sinus syndrome ([name])    [name]  . Skin cancer    surgery (nose) 09-2010    Past Surgical History:  Procedure Laterality Date  . ABDOMINAL AORTAGRAM N/A 11/10/2012   Procedure: ABDOMINAL Maxcine Ham;  Surgeon: Serafina Mitchell, MD;  Location: [facility] CATH LAB;  Service: Cardiovascular;  Laterality: N/A;  . AMPUTATION   01/2010   great toe  . AMPUTATION Left 07/16/2016   Procedure: RECECTION OF LEFT GREAT TOE METATARSAL HEAD and sesamoid;  Surgeon: Waynetta Sandy, MD;  Location: [place];  Service: Vascular;  Laterality: Left;  . AMPUTATION Left 07/31/2016   Procedure: SECOND TOE AMPUTATION;  Surgeon: Serafina Mitchell, MD;  Location: [facility] OR;  Service: Vascular;  Laterality: Left;  . ANGIOPLASTY / STENTING FEMORAL  02/10/10   Left femoral  . ANGIOPLASTY ILLIAC ARTERY Left 07/16/2016   Procedure: drug coated Balloon ANGIOPLASTY of left superficial femoral artry stent;  Surgeon: Waynetta Sandy, MD;  Location: [place];  Service: Vascular;  Laterality: Left;  . AORTIC VALVE REPLACEMENT     biophrostetic for Ao Stenosis 2006  . AORTIC VALVE REPLACEMENT  01/17/2011   S/P redo median sternotomy, extracorporeal circulation, redo Aortic Valve Replacement using a 23-mm Edwards pericardial Magna-Ease valve, Dr Ulla Gallo  . AORTOGRAM N/A 07/16/2016   Procedure: left lower extremity angiogram;  Surgeon: Waynetta Sandy, MD;  Location: [place];  Service: Vascular;  Laterality: N/A;  . APPLICATION OF WOUND VAC Left 07/31/2016   Procedure: APPLICATION OF WOUND VAC;  Surgeon: Serafina Mitchell, MD;  Location: [place];  Service: Vascular;  Laterality: Left;  . CATARACT EXTRACTION, BILATERAL    . CORONARY ARTERY BYPASS GRAFT  2006  . EP IMPLANTABLE DEVICE N/A 06/06/2016   MDT Generator change with Advisa SR MRI device implanted by Dr Rayann Heman  . ESOPHAGOGASTRODUODENOSCOPY (EGD) WITH PROPOFOL N/A 08/07/2016   Procedure: ESOPHAGOGASTRODUODENOSCOPY (EGD) WITH PROPOFOL;  Surgeon: Mauri Pole, MD;  Location: [facility];  Service: Endoscopy;  Laterality: N/A;  . EYE SURGERY    . ILIAC ATHERECTOMY Left 07/16/2016   Procedure: Angioplasty and ATHERECTOMY of left anterior tibial artery;  Surgeon: Waynetta Sandy, MD;  Location: [place];  Service: Vascular;  Laterality: Left;  . INCISE AND DRAIN ABCESS Right May  2014   right heel   . LOWER EXTREMITY ANGIOGRAM Left 11/23/2012   Procedure: LOWER EXTREMITY ANGIOGRAM;  Surgeon: Serafina Mitchell, MD;  Location: [facility] CATH LAB;  Service: Cardiovascular;  Laterality: Left;  . PACEMAKER PLACEMENT  07/2005   Medtronic Sigma SR implanted by Dr Olevia Perches  . PERIPHERAL VASCULAR CATHETERIZATION N/A 07/10/2016   Procedure: Abdominal Aortogram;  Surgeon: Conrad Shawnee, MD;  Location: [place] CV LAB;  Service: Cardiovascular;  Laterality: N/A;  . PERIPHERAL VASCULAR CATHETERIZATION Bilateral 07/10/2016   Procedure: Lower Extremity Angiography;  Surgeon: Conrad [place] Alpine, MD;  Location: [place] CV LAB;  Service: Cardiovascular;  Laterality: Bilateral;  . TONSILLECTOMY    . ULTRASOUND GUIDANCE FOR VASCULAR ACCESS Bilateral 07/16/2016   Procedure: ULTRASOUND GUIDANCE FOR VASCULAR ACCESS of right common femoral and left dorsalis pedis;  Surgeon: Waynetta Sandy, MD;  Location: [facility] OR;  Service: Vascular;  Laterality: Bilateral;  . WOUND DEBRIDEMENT Left 07/31/2016   Procedure: DEBRIDEMENT GREAT TOE AMPUTATION SITE;  Surgeon: Serafina Mitchell, MD;  Location: [place];  Service: Vascular;  Laterality: Left;     reports that  he quit smoking about 28 years ago. His smoking use included Cigarettes. He has quit using smokeless tobacco. He reports that he does not drink alcohol or use drugs.  Allergies  Allergen Reactions  . Potassium-Containing Compounds Anaphylaxis    IV--loss of conciousness  . Metoprolol Succinate Other (See Comments)    Extremely tired    Family History  Problem Relation Age of Onset  . Heart attack Father 20  . Heart disease Father     Before age 7  . Hypertension Father   . Stroke Mother 42  . Cancer Sister     Cervical  . Heart attack Sister   . Heart disease Sister   . Diabetes Sister     2 sister w/ DM  . Heart attack Sister   . Heart attack Sister   . Heart disease Sister 19    AAA  and  Stomach Aneurysm  . AAA (abdominal aortic  aneurysm) Sister   . Heart disease Sister     Before age 6  . Heart attack Sister 74  . Colon cancer Neg Hx   . Prostate cancer Neg Hx      Prior to Admission medications   Medication Sig Start Date End Date Taking? Authorizing Provider  acetaminophen (TYLENOL) 325 MG tablet Take 650 mg by mouth 3 (three) times daily. 6AM, 2PM, 10PM   Yes Historical Provider, MD  albuterol (PROVENTIL) (2.5 MG/3ML) 0.083% nebulizer solution Take 3 mLs (2.5 mg total) by nebulization every 4 (four) hours as needed for wheezing or shortness of breath. 08/08/16  Yes Albertine Patricia, MD  ampicillin (PRINCIPEN) 500 MG capsule Take 500 mg by mouth every 6 (six) hours. 10 day course ordered 09/12/16 for wound infection in left foot   Yes Historical Provider, MD  aspirin EC 81 MG EC tablet Take 1 tablet (81 mg total) by mouth daily. 07/22/16  Yes Oswald Hillock, MD  diphenhydrAMINE (BENADRYL) 25 MG tablet Take 25 mg by mouth See admin instructions. Take 1 tablet (25 mg) by mouth every 8 hours for allergic reaction - order date 09/10/16   Yes Historical Provider, MD  docusate sodium (COLACE) 100 MG capsule Take 1 capsule (100 mg total) by mouth 2 (two) times daily. 08/08/16  Yes Albertine Patricia, MD  donepezil (ARICEPT) 10 MG tablet Take 1 tablet (10 mg total) by mouth at bedtime. 05/08/16  Yes Kathrynn Ducking, MD  ezetimibe-simvastatin (VYTORIN) 10-40 MG per tablet Take 1 tablet by mouth at bedtime. 05/06/12  Yes Larey Dresser, MD  ferrous sulfate 325 (65 FE) MG tablet Take 1 tablet (325 mg total) by mouth 2 (two) times daily with a meal. 08/08/16  Yes Albertine Patricia, MD  furosemide (LASIX) 40 MG tablet Take 1 tablet (40 mg total) by mouth 2 (two) times daily. 07/21/16  Yes Oswald Hillock, MD  insulin glargine (LANTUS) 100 UNIT/ML injection Inject 0.05 mLs (5 Units total) into the skin at bedtime. 08/08/16  Yes Silver Huguenin Elgergawy, MD  insulin lispro (HUMALOG) 100 UNIT/ML injection Inject 0-9 Units into the skin See  admin instructions. Inject 1-9 unit subcutaneously three time daily with meals per sliding scale: CBG <70 initiate hypoglycemic protocol, 70-120 0 units, 121-150 1 unit, 151-200 2 units, 201-250 3 units, 251-300 5 units, 301-350 7 units, 351-400 9 units, >400 Call MD   Yes Historical Provider, MD  ipratropium (ATROVENT) 0.02 % nebulizer solution Take 2.5 mLs (0.5 mg total) by nebulization every 4 (four) hours  as needed for wheezing or shortness of breath. 08/08/16  Yes Albertine Patricia, MD  metoprolol tartrate (LOPRESSOR) 25 MG tablet Take 1 tablet (25 mg total) by mouth 2 (two) times daily. 08/08/16  Yes Albertine Patricia, MD  Multiple Vitamins-Minerals (DECUBI-VITE) CAPS Take 1 capsule by mouth daily.   Yes Historical Provider, MD  nystatin (MYCOSTATIN/NYSTOP) powder Apply topically See admin instructions. Apply topically to groin three times daily for irritation   Yes Historical Provider, MD  OXYGEN Inhale 2 L into the lungs continuous.   Yes Historical Provider, MD  pantoprazole (PROTONIX) 40 MG tablet Take 1 tablet (40 mg total) by mouth 2 (two) times daily. 08/08/16  Yes Silver Huguenin Elgergawy, MD  potassium chloride SA (K-DUR,KLOR-CON) 20 MEQ tablet Take 20 mEq by mouth 2 (two) times daily.   Yes Historical Provider, MD  PRESCRIPTION MEDICATION Take 1 mL by mouth See admin instructions. Gel compounded at [facility] for [place]: Ativan 1 mg/Benadryl 25 mg/ Haldol 1 mg - per ml - apply 1 ml topically twice daily for anxiety/paranoia   Yes Historical Provider, MD  QUEtiapine (SEROQUEL) 50 MG tablet Take 50 mg by mouth at bedtime.   Yes Historical Provider, MD  saccharomyces boulardii (FLORASTOR) 250 MG capsule Take 250 mg by mouth 2 (two) times daily. Order date 09/13/16/ stop date 09/25/16 09/10/16 09/20/2016 Yes Historical Provider, MD  warfarin (COUMADIN) 2.5 MG tablet Take 2.5 mg by mouth daily.    Historical Provider, MD    Physical Exam: Vitals:   09/01/2016 1745 09/08/2016  1800 09/09/2016 1815 09/15/2016 1830  BP: 111/65 (!) 117/98 133/73 121/74  Pulse: 67 65 60 61  Resp: (!) 24 19 14  (!) 29  Temp:      TempSrc:      SpO2: 99% 100% 100% 100%      Constitutional: Mild tachypnea, appears uncomfortable, pale, chronically-ill in appearance.  Eyes: PERTLA, lids and conjunctivae normal ENMT: Mucous membranes are moist. Posterior pharynx clear of any exudate or lesions.   Neck: normal, supple, no masses, no thyromegaly Respiratory: Diminished at bases, rhonchi bilaterally. Mildly tachypneic. Pale. No accessory muscle use.  Cardiovascular: S1 & S2 heard, regular rate and rhythm. No extremity edema. No significant JVD. Abdomen: No distension, no tenderness, no masses palpated. Bowel sounds normal.  Musculoskeletal: no clubbing / cyanosis. No joint deformity upper and lower extremities.    Skin: Pale. Excoriations about distal LE's. Warm, dry, well-perfused. Neurologic: No gross facial asymmetry, PERRL, moving all extremities equally. Patellar DTR's symmetric.   Psychiatric: Difficult to assess given clinical circumstances.     Labs on Admission: I have personally reviewed following labs and imaging studies  CBC:  Recent Labs Lab 09/09/2016 1722 09/16/2016 1748  WBC 12.9*  --   NEUTROABS 7.5  --   HGB 7.9* 9.2*  HCT 28.7* 27.0*  MCV 87.5  --   PLT 316  --    Basic Metabolic Panel:  Recent Labs Lab 09/15/2016 1722 08/31/2016 1748  NA 139 138  K 4.0 4.2  CL 97* 96*  CO2 34*  --   GLUCOSE 112* 111*  BUN 25* 33*  CREATININE 1.29* 1.50*  CALCIUM 9.2  --    GFR: Estimated Creatinine Clearance: 41.2 mL/min (A) (by C-G formula based on SCr of 1.5 mg/dL (H)). Liver Function Tests:  Recent Labs Lab 09/15/2016 1722  AST 50*  ALT 59  ALKPHOS 220*  BILITOT 0.7  PROT 6.7  ALBUMIN 2.2*  No results for input(s): LIPASE, AMYLASE in the last 168 hours. No results for input(s): AMMONIA in the last 168 hours. Coagulation Profile: No results for input(s):  INR, PROTIME in the last 168 hours. Cardiac Enzymes: No results for input(s): CKTOTAL, CKMB, CKMBINDEX, TROPONINI in the last 168 hours. BNP (last 3 results) No results for input(s): PROBNP in the last 8760 hours. HbA1C: No results for input(s): HGBA1C in the last 72 hours. CBG: No results for input(s): GLUCAP in the last 168 hours. Lipid Profile: No results for input(s): CHOL, HDL, LDLCALC, TRIG, CHOLHDL, LDLDIRECT in the last 72 hours. Thyroid Function Tests: No results for input(s): TSH, T4TOTAL, FREET4, T3FREE, THYROIDAB in the last 72 hours. Anemia Panel: No results for input(s): VITAMINB12, FOLATE, FERRITIN, TIBC, IRON, RETICCTPCT in the last 72 hours. Urine analysis:    Component Value Date/Time   COLORURINE YELLOW 07/25/2016 0348   APPEARANCEUR CLEAR 07/25/2016 0348   LABSPEC 1.013 07/25/2016 0348   PHURINE 5.0 07/25/2016 0348   GLUCOSEU NEGATIVE 07/25/2016 0348   HGBUR NEGATIVE 07/25/2016 0348   BILIRUBINUR NEGATIVE 07/25/2016 0348   KETONESUR NEGATIVE 07/25/2016 0348   PROTEINUR NEGATIVE 07/25/2016 0348   UROBILINOGEN 1.0 01/14/2011 1149   NITRITE NEGATIVE 07/25/2016 0348   LEUKOCYTESUR NEGATIVE 07/25/2016 0348   Sepsis Labs: @LABRCNTIP (procalcitonin:4,lacticidven:4) )No results found for this or any previous visit (from the past 240 hour(s)).   Radiological Exams on Admission: Dg Chest Port 1 View  Result Date: 09/01/2016 CLINICAL DATA:  Granulomatous EXAM: PORTABLE CHEST 1 VIEW COMPARISON:  08/07/2016 FINDINGS: New moderate to large right pleural effusion is noted along the periphery of the right hemithorax. Loculation of this fluid is not excluded. There is borderline cardiomegaly with CHF. Median sternotomy sutures are noted with aortic valvular replacement. Single lead left sided pacemaker apparatus is noted with lead tip in the expected location of right ventricle. IMPRESSION: CHF with moderate to large right-sided pleural effusion. Loculated fluid is not  entirely excluded. Electronically Signed   By: Ashley Royalty M.D.   On: 08/31/2016 17:43    EKG: Independently reviewed. Sinus rhythm, late R-transition, minimal ST-depression in some inferior leads.   Assessment/Plan  1. Accidental overdose  - Pt experiences frequent agitation and hallucinations at his nursing facility and is treated with a topical preparation with 1 mg Ativan, 1 mg Haldol, and 25 mg Benadryl per dose; he was inadvertently given 10x the intended dose, and it was administered orally  - Poison control had advised monitoring and supportive care  - Pt is protecting his airway on admission, but in some respiratory distress and with increased O2-requirement as discussed below  - Plan to continue telemetry monitoring with supportive care    2. Acute on chronic hypoxic respiratory failure  - Pt requires 2 Lpm supplemental O2 at baseline  - He is in respiratory distress on admission, requiring 4 Lpm via nasal canula in ED  - CXR with mod-large left-sided effusion; rhonchi bilaterally on auscultation and concern for aspiration clinically  - He also has chronic CHF with preserved EF and was given 40 mg IV Lasix x1 in ED  - Suspect the effusion and possible aspiration to be responsible for the acute hypoxia; addressed as below    3. Pleural effusion, left  - Moderate-to-large left pleural effusion noted on admission CXR  - Pt has increased supplemental O2-requirement and is in mild distress  - IR thoracentesis requested; warfarin has been on hold at the SNF since at least 09/18/16; INR pending, pt NPO  4. Chronic diastolic CHF  - Pt appears intravascularly depleted, but CXR findings suggest CHF  - TTE (07/31/16) with EF 55-60%, moderate concentric LVH, no WMA, not able to determine diastolic function, mild-mod MR, bioprosthetic AV okay, moderate TR, mod-severe LAE, severely elevated PA pressures - Managed at home with Lasix 40 mg BID  - Given 40 mg IV Lasix in ED  - Plan to follow  strict I/O's and daily wts, SLIV  - No additional diuretic ordered on admission, will follow clinically and treat as indicated    5. Chronic atrial fibrillation - In a regular rhythm on admission   - CHADS-VASc at least 4 (age x2, CHF, DM)  - Managed with warfarin; this is held on admission while checking FOBT and trending Hgb, and as thoracentesis will likely be needed    - Monitor on telemetry   6. COPD, OSA  - Pt requires 2 Lpm supplemental O2 around-the-clock at baseline  - Continue breathing treatments, titrate FiO2 to maintain sat >89%, CPAP qHS   7. Anemia  - Hgb is 7.9 on admission; has been 8-9 range  - Required 4 units pRBCs for upper GIB in February 2018; had been cleared by GI to resume warfarin with BID Protonix; Hgb was 8.0 when discharged   - Warfarin is held on admission, INR pending  - Continue PPI intravenously until appropriate for a diet  - Trend H&H   8. Insulin-dependent DM  - A1c was 7.4% in January 2018  - Managed with Lantus 5 units qHS and Humalog 0-9 units TID at the nursing facility  - Check CBG q4h until appropriate for a diet  - Continue Lantus 5 units qHS and a low-intensity sliding-scale correctional    9. Chronic wounds  - Wounds noted to left foot and sacrum  - Has been receiving wound care and ampicillin  - Continue wound care, currently on Unasyn    10. Agitation  - Pt was treated at SNF with 10 mg Ativan, 10 mg Haldol, and 250 mg Benadryl orally just PTA as above  - Hold sedating medications for now  - Monitor    11. ?Aspiration PNA  - Empiric Unasyn initiated  - SLP eval requested    DVT prophylaxis: SCD's Code Status: Full  Family Communication: Daughters updated at bedside Disposition Plan: Observe on telemetry Consults called: None Admission status: Observation    Vianne Bulls, MD [facility] Pager [phone]  If 7PM-7AM, please contact night-coverage [email or web address] Password [facility]  08/23/2016, 7:54 PM

## 2016-09-19 NOTE — ED Notes (Signed)
Bladder scan: 350mL 

## 2016-09-19 NOTE — ED Notes (Signed)
ED Provider at bedside. 

## 2016-09-19 NOTE — ED Provider Notes (Signed)
[facility] DEPT Provider Note   CSN: [id number] Arrival date & time: 09/18/2016  1715    History   Chief Complaint Chief Complaint  Patient presents with  . Ingestion    HPI [name]. is a 82 y.o. male.  Level V caveat applies secondary to altered mental status.  82 year old male with a history of arthritis, atrial fibrillation (on chronic coumadin), coronary artery disease, CHF, diabetes mellitus, hypertension, dyslipidemia, PVD, and 3L chronic O2 requirement presents to the emergency department for accidental overdose. Nursing facility reports that patient is to receive 1 mL of 1mg  Ativan/25mg  Benadryl/1mg  Haldol topically for agitation. New RN was allegedly administering medications and give 54mL of this medication orally equating to 10mg  Ativan, 10mg  Haldol, and 250mg  Benadryl orally at 1230. Facility called EMS when patient was found to be much more somnolent and hard to arouse. Patient will respond briefly to sternal rubbing and loud voice to shake his head or nod to "yes or no" questions. He denies any pain complaints.   The history is provided by the EMS personnel. No language interpreter was used.    Past Medical History:  Diagnosis Date  . Aortal stenosis 2006   biophrostetic  . Arthritis   . Atrial fibrillation ([place])    onset after CABG--- coumadin management at [facility]  . Carotid artery disease ([place]) 09/27/2010   Carotid US 12/17: bilat ICA 40-59 >> 1 year FU  . CHF (congestive heart failure) ([place])    abter CABG  . Complete heart block ([place])   . Coronary artery disease   . DM type 2 (diabetes mellitus, type 2) ([place])    used to see Dr Meredith Pel, now f/u by Dr Larose Kells  . Erectile dysfunction   . Gangrene ([place])   . History of nephrolithiasis   . HTN (hypertension)   . Hyperlipidemia   . Memory loss   . Obesity   . OSA (obstructive sleep apnea)    not using CPAP  . Pacemaker 2/07   VVI  . PVD (peripheral vascular disease) ([name])    CT angio showed-  vascular insuff, intestine and RAS bilaterally, andgiogram 02/2009 severe PVD medical management  . S/P aortic valve replacement   . Sick sinus syndrome ([name])    [name]  . Skin cancer    surgery (nose) 09-2010    Patient Active Problem List   Diagnosis Date Noted  . Acute gastric ulcer with hemorrhage   . PVC (premature ventricular contraction)   . NSVT (nonsustained ventricular tachycardia) ([place])   . Right humeral fracture 07/25/2016  . Supratherapeutic INR 07/25/2016  . Chronic respiratory failure with hypoxia ([place]) 07/25/2016  . Fall 07/25/2016  . Alzheimer's dementia 07/25/2016  . Fracture of humerus neck, right, closed, initial encounter   . Anemia 07/24/2016  . Wound abscess   . Congestive heart failure ([place])   . S/P thoracentesis   . Malnutrition of moderate degree 07/04/2016  . Pleural effusion 07/04/2016  . Diabetic foot ulcer with osteomyelitis ([place]) 07/03/2016  . Hypoxia 07/03/2016  . Acute diastolic CHF (congestive heart failure) ([place]) 07/03/2016  . Abscess of abdominal wall 06/02/2014  . Abdominal wall abscess 06/02/2014  . Hyponatremia 06/02/2014  . Cellulitis 12/23/2013  . Aftercare following surgery of the circulatory system, [place] 10/05/2013  . Encounter for therapeutic drug monitoring 07/18/2013  . Tingling 11/01/2012  . Hypoxemia 08/04/2012  . Fatigue 01/20/2012  . Memory loss 01/20/2012  . Chronic diastolic heart failure ([place]) 09/01/2011  .  Chronic atrial fibrillation ([place]) 08/21/2011  . Paresthesia 12/16/2010  . Medicare annual wellness visit, initial 10/10/2010  . Carotid artery disease ([place]) 09/27/2010  . Long term (current) use of anticoagulants 09/26/2010  . THYROID NODULE 01/26/2010  . PVD (peripheral vascular disease) ([place]) 01/02/2009  . HIP PAIN, BILATERAL 01/02/2009  . HLD (hyperlipidemia) 08/16/2008  . MYOCARDIAL INFARCTION, HX OF 08/16/2008  . S/P AVR (aortic valve replacement) 08/16/2008  . PERIPHERAL VASCULAR DISEASE WITH  CLAUDICATION 08/16/2008  . ANEMIA, HX OF 08/16/2008  . AORTIC VALVE REPLACEMENT, HX OF 08/16/2008  . PACEMAKER, PERMANENT 08/16/2008  . ERECTILE DYSFUNCTION 11/26/2006  . DM (diabetes mellitus) type II uncontrolled, periph vascular disorder ([place]) 07/28/2006  . OBESITY, MORBID 07/28/2006  . Obstructive sleep apnea 07/28/2006  . Essential hypertension 07/28/2006  . CAD (coronary artery disease) 07/28/2006  . RENAL ARTERY STENOSIS 07/28/2006  . INSUFFICIENCY, VASCULAR NOS, INTESTINE 07/28/2006  . NEPHROLITHIASIS, HX OF 07/28/2006    Past Surgical History:  Procedure Laterality Date  . ABDOMINAL AORTAGRAM N/A 11/10/2012   Procedure: ABDOMINAL Maxcine Ham;  Surgeon: Serafina Mitchell, MD;  Location: [facility] CATH LAB;  Service: Cardiovascular;  Laterality: N/A;  . AMPUTATION  01/2010   great toe  . AMPUTATION Left 07/16/2016   Procedure: RECECTION OF LEFT GREAT TOE METATARSAL HEAD and sesamoid;  Surgeon: Waynetta Sandy, MD;  Location: [facility];  Service: Vascular;  Laterality: Left;  . AMPUTATION Left 07/31/2016   Procedure: SECOND TOE AMPUTATION;  Surgeon: Serafina Mitchell, MD;  Location: [facility] OR;  Service: Vascular;  Laterality: Left;  . ANGIOPLASTY / STENTING FEMORAL  02/10/10   Left femoral  . ANGIOPLASTY ILLIAC ARTERY Left 07/16/2016   Procedure: drug coated Balloon ANGIOPLASTY of left superficial femoral artry stent;  Surgeon: Waynetta Sandy, MD;  Location: [facility];  Service: Vascular;  Laterality: Left;  . AORTIC VALVE REPLACEMENT     biophrostetic for Ao Stenosis 2006  . AORTIC VALVE REPLACEMENT  01/17/2011   S/P redo median sternotomy, extracorporeal circulation, redo Aortic Valve Replacement using a 23-mm Edwards pericardial Magna-Ease valve, Dr Ulla Gallo  . AORTOGRAM N/A 07/16/2016   Procedure: left lower extremity angiogram;  Surgeon: Waynetta Sandy, MD;  Location: [facility];  Service: Vascular;  Laterality: N/A;  . APPLICATION OF WOUND VAC Left 07/31/2016   Procedure:  APPLICATION OF WOUND VAC;  Surgeon: Serafina Mitchell, MD;  Location: [facility];  Service: Vascular;  Laterality: Left;  . CATARACT EXTRACTION, BILATERAL    . CORONARY ARTERY BYPASS GRAFT  2006  . EP IMPLANTABLE DEVICE N/A 06/06/2016   MDT Generator change with Advisa SR MRI device implanted by Dr Rayann Heman  . ESOPHAGOGASTRODUODENOSCOPY (EGD) WITH PROPOFOL N/A 08/07/2016   Procedure: ESOPHAGOGASTRODUODENOSCOPY (EGD) WITH PROPOFOL;  Surgeon: Mauri Pole, MD;  Location: [facility] ENDOSCOPY;  Service: Endoscopy;  Laterality: N/A;  . EYE SURGERY    . ILIAC ATHERECTOMY Left 07/16/2016   Procedure: Angioplasty and ATHERECTOMY of left anterior tibial artery;  Surgeon: Waynetta Sandy, MD;  Location: [facility];  Service: Vascular;  Laterality: Left;  . INCISE AND DRAIN ABCESS Right May 2014   right heel   . LOWER EXTREMITY ANGIOGRAM Left 11/23/2012   Procedure: LOWER EXTREMITY ANGIOGRAM;  Surgeon: Serafina Mitchell, MD;  Location: [facility] CATH LAB;  Service: Cardiovascular;  Laterality: Left;  . PACEMAKER PLACEMENT  07/2005   Medtronic Sigma SR implanted by Dr Olevia Perches  . PERIPHERAL VASCULAR CATHETERIZATION N/A 07/10/2016   Procedure: Abdominal Aortogram;  Surgeon: Conrad Cowiche,  MD;  Location: [facility] CV LAB;  Service: Cardiovascular;  Laterality: N/A;  . PERIPHERAL VASCULAR CATHETERIZATION Bilateral 07/10/2016   Procedure: Lower Extremity Angiography;  Surgeon: Conrad [place], MD;  Location: [facility] CV LAB;  Service: Cardiovascular;  Laterality: Bilateral;  . TONSILLECTOMY    . ULTRASOUND GUIDANCE FOR VASCULAR ACCESS Bilateral 07/16/2016   Procedure: ULTRASOUND GUIDANCE FOR VASCULAR ACCESS of right common femoral and left dorsalis pedis;  Surgeon: Waynetta Sandy, MD;  Location: [facility] OR;  Service: Vascular;  Laterality: Bilateral;  . WOUND DEBRIDEMENT Left 07/31/2016   Procedure: DEBRIDEMENT GREAT TOE AMPUTATION SITE;  Surgeon: Serafina Mitchell, MD;  Location: [facility] OR;  Service: Vascular;  Laterality: Left;        Home Medications    Prior to Admission medications   Medication Sig Start Date End Date Taking? Authorizing Provider  acetaminophen (TYLENOL) 325 MG tablet Take 650 mg by mouth 3 (three) times daily. 6AM, 2PM, 10PM   Yes Historical Provider, MD  albuterol (PROVENTIL) (2.5 MG/3ML) 0.083% nebulizer solution Take 3 mLs (2.5 mg total) by nebulization every 4 (four) hours as needed for wheezing or shortness of breath. 08/08/16  Yes Albertine Patricia, MD  ampicillin (PRINCIPEN) 500 MG capsule Take 500 mg by mouth every 6 (six) hours. 10 day course ordered 09/12/16 for wound infection in left foot   Yes Historical Provider, MD  aspirin EC 81 MG EC tablet Take 1 tablet (81 mg total) by mouth daily. 07/22/16  Yes Oswald Hillock, MD  diphenhydrAMINE (BENADRYL) 25 MG tablet Take 25 mg by mouth See admin instructions. Take 1 tablet (25 mg) by mouth every 8 hours for allergic reaction - order date 09/10/16   Yes Historical Provider, MD  docusate sodium (COLACE) 100 MG capsule Take 1 capsule (100 mg total) by mouth 2 (two) times daily. 08/08/16  Yes Albertine Patricia, MD  donepezil (ARICEPT) 10 MG tablet Take 1 tablet (10 mg total) by mouth at bedtime. 05/08/16  Yes Kathrynn Ducking, MD  ezetimibe-simvastatin (VYTORIN) 10-40 MG per tablet Take 1 tablet by mouth at bedtime. 05/06/12  Yes Larey Dresser, MD  ferrous sulfate 325 (65 FE) MG tablet Take 1 tablet (325 mg total) by mouth 2 (two) times daily with a meal. 08/08/16  Yes Albertine Patricia, MD  furosemide (LASIX) 40 MG tablet Take 1 tablet (40 mg total) by mouth 2 (two) times daily. 07/21/16  Yes Oswald Hillock, MD  insulin glargine (LANTUS) 100 UNIT/ML injection Inject 0.05 mLs (5 Units total) into the skin at bedtime. 08/08/16  Yes Silver Huguenin Elgergawy, MD  insulin lispro (HUMALOG) 100 UNIT/ML injection Inject 0-9 Units into the skin See admin instructions. Inject 1-9 unit subcutaneously three time daily with meals per sliding scale: CBG <70 initiate  hypoglycemic protocol, 70-120 0 units, 121-150 1 unit, 151-200 2 units, 201-250 3 units, 251-300 5 units, 301-350 7 units, 351-400 9 units, >400 Call MD   Yes Historical Provider, MD  ipratropium (ATROVENT) 0.02 % nebulizer solution Take 2.5 mLs (0.5 mg total) by nebulization every 4 (four) hours as needed for wheezing or shortness of breath. 08/08/16  Yes Albertine Patricia, MD  metoprolol tartrate (LOPRESSOR) 25 MG tablet Take 1 tablet (25 mg total) by mouth 2 (two) times daily. 08/08/16  Yes Albertine Patricia, MD  Multiple Vitamins-Minerals (DECUBI-VITE) CAPS Take 1 capsule by mouth daily.   Yes Historical Provider, MD  nystatin (MYCOSTATIN/NYSTOP) powder Apply topically See admin instructions. Apply topically  to groin three times daily for irritation   Yes Historical Provider, MD  OXYGEN Inhale 2 L into the lungs continuous.   Yes Historical Provider, MD  pantoprazole (PROTONIX) 40 MG tablet Take 1 tablet (40 mg total) by mouth 2 (two) times daily. 08/08/16  Yes Silver Huguenin Elgergawy, MD  potassium chloride SA (K-DUR,KLOR-CON) 20 MEQ tablet Take 20 mEq by mouth 2 (two) times daily.   Yes Historical Provider, MD  PRESCRIPTION MEDICATION Take 1 mL by mouth See admin instructions. Gel compounded at [facility] for [place]: Ativan 1 mg/Benadryl 25 mg/ Haldol 1 mg - per ml - apply 1 ml topically twice daily for anxiety/paranoia   Yes Historical Provider, MD  QUEtiapine (SEROQUEL) 50 MG tablet Take 50 mg by mouth at bedtime.   Yes Historical Provider, MD  saccharomyces boulardii (FLORASTOR) 250 MG capsule Take 250 mg by mouth 2 (two) times daily. Order date 09/13/16/ stop date 09/25/16 09/10/16 09/04/2016 Yes Historical Provider, MD  warfarin (COUMADIN) 2.5 MG tablet Take 2.5 mg by mouth daily.    Historical Provider, MD    Family History Family History  Problem Relation Age of Onset  . Heart attack Father 66  . Heart disease Father     Before age 43  . Hypertension Father   .  Stroke Mother 18  . Cancer Sister     Cervical  . Heart attack Sister   . Heart disease Sister   . Diabetes Sister     2 sister w/ DM  . Heart attack Sister   . Heart attack Sister   . Heart disease Sister 96    AAA  and  Stomach Aneurysm  . AAA (abdominal aortic aneurysm) Sister   . Heart disease Sister     Before age 47  . Heart attack Sister 74  . Colon cancer Neg Hx   . Prostate cancer Neg Hx     Social History Social History  Substance Use Topics  . Smoking status: Former Smoker    Types: Cigarettes    Quit date: 06/23/1988  . Smokeless tobacco: Former Systems developer  . Alcohol use No     Allergies   Potassium-containing compounds and Metoprolol succinate   Review of Systems Review of Systems  Unable to perform ROS: Acuity of condition     Physical Exam Updated Vital Signs BP 121/74   Pulse 61   Temp 97.7 F (36.5 C) (Axillary)   Resp (!) 29   SpO2 100%   Physical Exam  Constitutional: He appears well-developed and well-nourished. No distress.  Somnolent. Will answer some yes/no questions to loud voice.   HENT:  Head: Normocephalic and atraumatic.  Eyes: Conjunctivae and EOM are normal. Pupils are equal, round, and reactive to light. No scleral icterus.  PERRL  Neck: Normal range of motion.  Cardiovascular: Normal rate, regular rhythm and intact distal pulses.   Pulmonary/Chest: Effort normal. No respiratory distress. He has no wheezes. He has no rales.  Respirations even and unlabored. Diffuse rhonchi. SpO2 98% on RA.  Abdominal: Soft. He exhibits no distension. There is no tenderness. There is no guarding.  Soft, nondistended abdomen.  Musculoskeletal: Normal range of motion.  Neurological: He is alert.  Alert to loud voice and sternal rubbing.  Skin: Skin is warm and dry. No rash noted. He is not diaphoretic. No erythema. There is pallor.  Psychiatric: He has a normal mood and affect. His behavior is normal.  Nursing note and vitals  reviewed.    ED  Treatments / Results  Labs (all labs ordered are listed, but only abnormal results are displayed) Labs Reviewed  CBC WITH DIFFERENTIAL/PLATELET - Abnormal; Notable for the following:       Result Value   WBC 12.9 (*)    RBC 3.28 (*)    Hemoglobin 7.9 (*)    HCT 28.7 (*)    MCH 24.1 (*)    MCHC 27.5 (*)    RDW 17.9 (*)    Monocytes Absolute 1.3 (*)    All other components within normal limits  COMPREHENSIVE METABOLIC PANEL - Abnormal; Notable for the following:    Chloride 97 (*)    CO2 34 (*)    Glucose, Bld 112 (*)    BUN 25 (*)    Creatinine, Ser 1.29 (*)    Albumin 2.2 (*)    AST 50 (*)    Alkaline Phosphatase 220 (*)    GFR calc non Af Amer 51 (*)    GFR calc Af Amer 59 (*)    All other components within normal limits  ACETAMINOPHEN LEVEL - Abnormal; Notable for the following:    Acetaminophen (Tylenol), Serum <10 (*)    All other components within normal limits  BRAIN NATRIURETIC PEPTIDE - Abnormal; Notable for the following:    B Natriuretic Peptide 581.0 (*)    All other components within normal limits  I-STAT CHEM 8, ED - Abnormal; Notable for the following:    Chloride 96 (*)    BUN 33 (*)    Creatinine, Ser 1.50 (*)    Glucose, Bld 111 (*)    Calcium, Ion 1.10 (*)    Hemoglobin 9.2 (*)    HCT 27.0 (*)    All other components within normal limits  SALICYLATE LEVEL  I-STAT CG4 LACTIC ACID, ED    EKG  EKG Interpretation  Date/Time:  Friday September 19 2016 17:18:32 EDT Ventricular Rate:  70 PR Interval:    QRS Duration: 179 QT Interval:  496 QTC Calculation: 536 R Axis:   -86 Text Interpretation:  Ventricular-paced rhythm Ventricular premature complex Confirmed by DELO  MD, DOUGLAS ([id number]) on 09/05/2016 5:27:15 PM       Radiology Dg Chest Port 1 View  Result Date: 09/16/2016 CLINICAL DATA:  Granulomatous EXAM: PORTABLE CHEST 1 VIEW COMPARISON:  08/07/2016 FINDINGS: New moderate to large right pleural effusion is noted along the periphery of the right  hemithorax. Loculation of this fluid is not excluded. There is borderline cardiomegaly with CHF. Median sternotomy sutures are noted with aortic valvular replacement. Single lead left sided pacemaker apparatus is noted with lead tip in the expected location of right ventricle. IMPRESSION: CHF with moderate to large right-sided pleural effusion. Loculated fluid is not entirely excluded. Electronically Signed   By: Ashley Royalty M.D.   On: 09/13/2016 17:43    Procedures Procedures (including critical care time)  Medications Ordered in ED Medications  furosemide (LASIX) injection 40 mg (40 mg Intravenous Given 09/10/2016 1842)     Initial Impression / Assessment and Plan / ED Course  I have reviewed the triage vital signs and the nursing notes.  Pertinent labs & imaging results that were available during my care of the patient were reviewed by me and considered in my medical decision making (see chart for details).     Patient presents to the emergency department after alleged accidental overdose. Patient was given 10 times the prescribed dose of ABH Gel orally at 1230, per  EMS. Paramedics were called after RN noted increasing somnolence post ingestion. Patient has yet to return to his baseline; family at bedside. Poison control contacted at 1730; recommend supportive care and monitoring. Foley placed to monitor I's and O's given increased risk of urinary retention from Benadryl overdose. Bladder scan with 334mL; creatinine slightly above baseline.  Patient, as an aside, found to have congested cough. CXR shows new moderate to large right pleural effusion with CHF changes. BNP is elevated today. Patient given 40mg  of Lasix IV. He has never been hypoxic on his chronic 3L O2 via nasal cannula.  Case discussed with Dr. Myna Hidalgo of [facility] who will admit for further inpatient monitoring.   Final Clinical Impressions(s) / ED Diagnoses   Final diagnoses:  Accidental drug overdose, initial encounter  Altered  mental status, unspecified altered mental status type  Pleural effusion, right    New Prescriptions New Prescriptions   No medications on file     Antonietta Breach, PA-C 08/29/2016 [place], MD 08/23/2016 2351

## 2016-09-19 NOTE — ED Notes (Signed)
EDP and family at the bedside.

## 2016-09-19 NOTE — ED Triage Notes (Signed)
Pt presents via nursing facility with overdose of ativan, haldol, and benadryl. Per EMS, pt with order to receive 1 mL of cream containing 1mg  ativan. 1mg  haldol, and 250 mg benadryl, per EMS nursing personnel administered 107mL of the cream orally containing a total of 10mg  ativan, 10mg  ativan, and 250 mg benadryl at approx. 1230 today. 106/74, HR 80, 99-100% on 3L of O2. Pt on 3L O2 at home. 18G in L AC. Per EMS, family notified and on the way

## 2016-09-20 ENCOUNTER — Observation Stay (HOSPITAL_COMMUNITY): Payer: Medicare Other

## 2016-09-20 DIAGNOSIS — Z950 Presence of cardiac pacemaker: Secondary | ICD-10-CM | POA: Diagnosis not present

## 2016-09-20 DIAGNOSIS — J810 Acute pulmonary edema: Secondary | ICD-10-CM | POA: Diagnosis not present

## 2016-09-20 DIAGNOSIS — Z4682 Encounter for fitting and adjustment of non-vascular catheter: Secondary | ICD-10-CM | POA: Diagnosis not present

## 2016-09-20 DIAGNOSIS — L8990 Pressure ulcer of unspecified site, unspecified stage: Secondary | ICD-10-CM | POA: Insufficient documentation

## 2016-09-20 DIAGNOSIS — E1152 Type 2 diabetes mellitus with diabetic peripheral angiopathy with gangrene: Secondary | ICD-10-CM | POA: Diagnosis not present

## 2016-09-20 DIAGNOSIS — E1165 Type 2 diabetes mellitus with hyperglycemia: Secondary | ICD-10-CM | POA: Diagnosis not present

## 2016-09-20 DIAGNOSIS — I10 Essential (primary) hypertension: Secondary | ICD-10-CM | POA: Diagnosis not present

## 2016-09-20 DIAGNOSIS — J984 Other disorders of lung: Secondary | ICD-10-CM | POA: Diagnosis not present

## 2016-09-20 DIAGNOSIS — E1151 Type 2 diabetes mellitus with diabetic peripheral angiopathy without gangrene: Secondary | ICD-10-CM | POA: Diagnosis not present

## 2016-09-20 DIAGNOSIS — J690 Pneumonitis due to inhalation of food and vomit: Secondary | ICD-10-CM | POA: Diagnosis not present

## 2016-09-20 DIAGNOSIS — G4733 Obstructive sleep apnea (adult) (pediatric): Secondary | ICD-10-CM | POA: Diagnosis not present

## 2016-09-20 DIAGNOSIS — R918 Other nonspecific abnormal finding of lung field: Secondary | ICD-10-CM | POA: Diagnosis not present

## 2016-09-20 DIAGNOSIS — D500 Iron deficiency anemia secondary to blood loss (chronic): Secondary | ICD-10-CM | POA: Diagnosis not present

## 2016-09-20 DIAGNOSIS — I482 Chronic atrial fibrillation: Secondary | ICD-10-CM | POA: Diagnosis not present

## 2016-09-20 DIAGNOSIS — R188 Other ascites: Secondary | ICD-10-CM | POA: Diagnosis not present

## 2016-09-20 DIAGNOSIS — E870 Hyperosmolality and hypernatremia: Secondary | ICD-10-CM | POA: Diagnosis not present

## 2016-09-20 DIAGNOSIS — E876 Hypokalemia: Secondary | ICD-10-CM | POA: Diagnosis not present

## 2016-09-20 DIAGNOSIS — T50901A Poisoning by unspecified drugs, medicaments and biological substances, accidental (unintentional), initial encounter: Secondary | ICD-10-CM | POA: Diagnosis not present

## 2016-09-20 DIAGNOSIS — D509 Iron deficiency anemia, unspecified: Secondary | ICD-10-CM | POA: Diagnosis not present

## 2016-09-20 DIAGNOSIS — I739 Peripheral vascular disease, unspecified: Secondary | ICD-10-CM | POA: Diagnosis not present

## 2016-09-20 DIAGNOSIS — Z452 Encounter for adjustment and management of vascular access device: Secondary | ICD-10-CM | POA: Diagnosis not present

## 2016-09-20 DIAGNOSIS — J9690 Respiratory failure, unspecified, unspecified whether with hypoxia or hypercapnia: Secondary | ICD-10-CM | POA: Diagnosis not present

## 2016-09-20 DIAGNOSIS — F0390 Unspecified dementia without behavioral disturbance: Secondary | ICD-10-CM | POA: Diagnosis present

## 2016-09-20 DIAGNOSIS — D696 Thrombocytopenia, unspecified: Secondary | ICD-10-CM | POA: Diagnosis not present

## 2016-09-20 DIAGNOSIS — R451 Restlessness and agitation: Secondary | ICD-10-CM | POA: Diagnosis not present

## 2016-09-20 DIAGNOSIS — J90 Pleural effusion, not elsewhere classified: Secondary | ICD-10-CM | POA: Diagnosis not present

## 2016-09-20 DIAGNOSIS — R0602 Shortness of breath: Secondary | ICD-10-CM | POA: Diagnosis not present

## 2016-09-20 DIAGNOSIS — J9621 Acute and chronic respiratory failure with hypoxia: Secondary | ICD-10-CM | POA: Diagnosis not present

## 2016-09-20 DIAGNOSIS — T50901D Poisoning by unspecified drugs, medicaments and biological substances, accidental (unintentional), subsequent encounter: Secondary | ICD-10-CM

## 2016-09-20 DIAGNOSIS — I2510 Atherosclerotic heart disease of native coronary artery without angina pectoris: Secondary | ICD-10-CM | POA: Diagnosis not present

## 2016-09-20 DIAGNOSIS — I998 Other disorder of circulatory system: Secondary | ICD-10-CM | POA: Diagnosis present

## 2016-09-20 DIAGNOSIS — E785 Hyperlipidemia, unspecified: Secondary | ICD-10-CM | POA: Diagnosis present

## 2016-09-20 DIAGNOSIS — Z515 Encounter for palliative care: Secondary | ICD-10-CM | POA: Diagnosis not present

## 2016-09-20 DIAGNOSIS — G92 Toxic encephalopathy: Secondary | ICD-10-CM | POA: Diagnosis not present

## 2016-09-20 DIAGNOSIS — I5033 Acute on chronic diastolic (congestive) heart failure: Secondary | ICD-10-CM | POA: Diagnosis not present

## 2016-09-20 DIAGNOSIS — R4182 Altered mental status, unspecified: Secondary | ICD-10-CM | POA: Diagnosis not present

## 2016-09-20 DIAGNOSIS — N179 Acute kidney failure, unspecified: Secondary | ICD-10-CM | POA: Diagnosis not present

## 2016-09-20 DIAGNOSIS — Z66 Do not resuscitate: Secondary | ICD-10-CM | POA: Diagnosis not present

## 2016-09-20 DIAGNOSIS — I700 Atherosclerosis of aorta: Secondary | ICD-10-CM | POA: Diagnosis not present

## 2016-09-20 DIAGNOSIS — R0902 Hypoxemia: Secondary | ICD-10-CM | POA: Diagnosis not present

## 2016-09-20 DIAGNOSIS — J811 Chronic pulmonary edema: Secondary | ICD-10-CM | POA: Diagnosis not present

## 2016-09-20 DIAGNOSIS — I509 Heart failure, unspecified: Secondary | ICD-10-CM | POA: Diagnosis not present

## 2016-09-20 DIAGNOSIS — T424X1A Poisoning by benzodiazepines, accidental (unintentional), initial encounter: Secondary | ICD-10-CM | POA: Diagnosis not present

## 2016-09-20 DIAGNOSIS — Z781 Physical restraint status: Secondary | ICD-10-CM | POA: Diagnosis not present

## 2016-09-20 DIAGNOSIS — I959 Hypotension, unspecified: Secondary | ICD-10-CM | POA: Diagnosis not present

## 2016-09-20 DIAGNOSIS — N183 Chronic kidney disease, stage 3 (moderate): Secondary | ICD-10-CM | POA: Diagnosis present

## 2016-09-20 DIAGNOSIS — J9601 Acute respiratory failure with hypoxia: Secondary | ICD-10-CM | POA: Diagnosis not present

## 2016-09-20 DIAGNOSIS — I130 Hypertensive heart and chronic kidney disease with heart failure and stage 1 through stage 4 chronic kidney disease, or unspecified chronic kidney disease: Secondary | ICD-10-CM | POA: Diagnosis not present

## 2016-09-20 DIAGNOSIS — E43 Unspecified severe protein-calorie malnutrition: Secondary | ICD-10-CM | POA: Diagnosis not present

## 2016-09-20 DIAGNOSIS — J9611 Chronic respiratory failure with hypoxia: Secondary | ICD-10-CM | POA: Diagnosis not present

## 2016-09-20 LAB — CBC WITH DIFFERENTIAL/PLATELET
BASOS ABS: 0 10*3/uL (ref 0.0–0.1)
Basophils Relative: 0 %
EOS ABS: 0 10*3/uL (ref 0.0–0.7)
EOS PCT: 0 %
HCT: 27 % — ABNORMAL LOW (ref 39.0–52.0)
Hemoglobin: 7.5 g/dL — ABNORMAL LOW (ref 13.0–17.0)
LYMPHS PCT: 10 %
Lymphs Abs: 1.4 10*3/uL (ref 0.7–4.0)
MCH: 24.4 pg — ABNORMAL LOW (ref 26.0–34.0)
MCHC: 27.8 g/dL — ABNORMAL LOW (ref 30.0–36.0)
MCV: 87.7 fL (ref 78.0–100.0)
Monocytes Absolute: 1.4 10*3/uL — ABNORMAL HIGH (ref 0.1–1.0)
Monocytes Relative: 10 %
NEUTROS PCT: 80 %
Neutro Abs: 11 10*3/uL — ABNORMAL HIGH (ref 1.7–7.7)
Platelets: 315 10*3/uL (ref 150–400)
RBC: 3.08 MIL/uL — AB (ref 4.22–5.81)
RDW: 18 % — ABNORMAL HIGH (ref 11.5–15.5)
WBC: 13.9 10*3/uL — ABNORMAL HIGH (ref 4.0–10.5)

## 2016-09-20 LAB — PROTIME-INR
INR: 2.23
Prothrombin Time: 25 seconds — ABNORMAL HIGH (ref 11.4–15.2)

## 2016-09-20 LAB — GLUCOSE, CAPILLARY
GLUCOSE-CAPILLARY: 130 mg/dL — AB (ref 65–99)
Glucose-Capillary: 119 mg/dL — ABNORMAL HIGH (ref 65–99)
Glucose-Capillary: 118 mg/dL — ABNORMAL HIGH (ref 65–99)
Glucose-Capillary: 130 mg/dL — ABNORMAL HIGH (ref 65–99)
Glucose-Capillary: 123 mg/dL — ABNORMAL HIGH (ref 65–99)

## 2016-09-20 LAB — BODY FLUID CELL COUNT WITH DIFFERENTIAL
Eos, Fluid: 0 %
LYMPHS FL: 58 %
Monocyte-Macrophage-Serous Fluid: 42 % — ABNORMAL LOW (ref 50–90)
NEUTROPHIL FLUID: 0 % (ref 0–25)
Total Nucleated Cell Count, Fluid: 56 cu mm (ref 0–1000)

## 2016-09-20 LAB — COMPREHENSIVE METABOLIC PANEL
ALT: 54 U/L (ref 17–63)
AST: 41 U/L (ref 15–41)
Albumin: 2.3 g/dL — ABNORMAL LOW (ref 3.5–5.0)
Alkaline Phosphatase: 202 U/L — ABNORMAL HIGH (ref 38–126)
Anion gap: 10 (ref 5–15)
BUN: 29 mg/dL — AB (ref 6–20)
CALCIUM: 9.1 mg/dL (ref 8.9–10.3)
CHLORIDE: 98 mmol/L — AB (ref 101–111)
CO2: 34 mmol/L — AB (ref 22–32)
CREATININE: 1.47 mg/dL — AB (ref 0.61–1.24)
GFR calc Af Amer: 51 mL/min — ABNORMAL LOW (ref >60)
GFR, EST NON AFRICAN AMERICAN: 44 mL/min — AB (ref >60)
GLUCOSE: 117 mg/dL — AB (ref 65–99)
Potassium: 4 mmol/L (ref 3.5–5.1)
SODIUM: 142 mmol/L (ref 135–145)
Total Bilirubin: 0.9 mg/dL (ref 0.3–1.2)
Total Protein: 6.1 g/dL — ABNORMAL LOW (ref 6.5–8.1)

## 2016-09-20 MED ORDER — LIDOCAINE HCL (PF) 1 % IJ SOLN
INTRAMUSCULAR | Status: AC
  Filled 2016-09-20: qty 10

## 2016-09-20 MED ORDER — QUETIAPINE FUMARATE 50 MG PO TABS
50.0000 mg | ORAL_TABLET | Freq: Once | ORAL | Status: AC
  Administered 2016-09-20: 50 mg via ORAL
  Filled 2016-09-20: qty 1

## 2016-09-20 MED ORDER — SODIUM CHLORIDE 0.9 % IV SOLN: 250.0000 mL | INTRAVENOUS | Status: DC | PRN

## 2016-09-20 NOTE — Progress Notes (Signed)
RT called to pt room by RRN about pt desat event. Pt was getting nebulizer treatment upon RT entering room. RT obtained ABG per order and went with pt upstairs to 4E. RT stated pt may need to be NTS RN agreed. RT used 10Fr catheter with lubrication and had moderate return of very thick tan/white secretions. Pt tolerated procedure fairly well, pt did have desat episode and NRB was placed to help with O2. RT to continue to monitor as needed.

## 2016-09-20 NOTE — Procedures (Signed)
[place] right thoracentesis No complication No blood loss. See complete dictation in [facility].

## 2016-09-20 NOTE — Significant Event (Signed)
Rapid Response Event Note  Overview: Time Called: 2150 Arrival Time: 2155 Event Type: Respiratory  Initial Focused Assessment: Called by bedside RN to assess patient. Per bedside RN, patient was having some respiratory distress and low oxygen saturations.  Patient was admitted from SNF, where patient incurred an accidental overdose, per documentation Ativan 10 mg, Haldol 10 mg, and Benadryl 250 mg orally via cream formulation.  Upon arrival, patient was on 6L oxygen nasal cannula and oxygen saturations were in the low 80s (80-82%).  Patient was placed on NRB 15L, lung sounds were rhonchi, coarse, and overall diminished.  Baseline patient does not speak much (dementia) but patient did respond to pain and did follow commands at times.  + 2 pulses, + wounds to BLE, and other VS were stable.  MD was called to discuss care.   Interventions: After being placed on the NRB, saturations improved to 99-100%, patient was weaned to [facility] Mask 12L 50% FiO2, sats maintained, and then patient was transitioned back to nasal cannula 6L.  Prior to transitioning to [place], patient was given duoneb x 1.  MD was called, updated on patient's status. Duoneb x 1, CPAP/BIPAP if needed, ABG, and SDU transfer orders were given.  Plan of Care (if not transferred): Patient transferred to 4E25 by primary RN (RR RN called to [place]).  I followed up with the patient on 4E, patient's ABG was compensated, discussed with RT, no need for CPAP/BIPAP now.  Patient was NTS by RT and thick yellow secretions were obtained.  Patient could be aspirating and is not able to cough secretions up. Family updated, VSS.  Event Summary: Name of Physician Notified: Dr. Myna Hidalgo at 2210    at    Outcome: Transferred (Comment)  Event End Time: [place], [place]

## 2016-09-20 NOTE — Progress Notes (Signed)
[place]  BWG:[id number] DOB: [birth year] DOA: 09/18/2016 PCP: [name], MD    Brief Narrative:  82 y.o. male with history of COPD, chronic diastolic CHF, CAD status post CABG, chronic atrial fibrillation on Coumadin, intermittent agitation, and chronic wounds involving the left foot and sacrum who presented to the ED from his SNF for evaluation of somnolence following an accidental overdose of Ativan, Haldol, and Benadryl. Patient reportedly is treated at his nursing facility with a compounded topical cream containing 1 mg Ativan, 1 mg Haldol, and 25 mg Benadryl per dose. He typically receives this twice daily as needed for agitation, but was inadvertently given a 10x dose by a new nurse today, and it was administered orally rather than topically. Patient was noted to be increasingly somnolent at the nursing facility and EMS was called for transport to the hospital. Per the report of his family at the bedside, he had begun to show good improvement at the nursing facility after being discharged from the hospital on 08/09/2016 after nonoperative management of right humerus fracture, treatment of bleeding ulcer with endoscopic clipping and 4 units of packed red blood cells, and drainage of a right pleural effusion.   Subjective: The pt is intermittently sedate, and agitated.  He is attempting to pull out his IVs and get out of bed.  He is unable to provide a reliable hx.    Assessment & Plan:  Accidental overdose  Cont supportive care   Agitation  Hold all sedating meds in setting of signif accidental OD - soft restraints as needed to protect supportive equipment   Acute on chronic hypoxic respiratory failure - ?Aspiration requires 2L O2 at baseline - acute decline likely due to respiratory suppression w/ OD of 2 sedating meds - wean O2 as able but presently still requiring FM oxygen, w/ rapid severe desaturation when he pulls off his mask - cont  empiric abx for possible aspiration   R Pleural effusion noted on admission CXR - IR thoracentesis performed today - f/u studies and serial CXR for re-accumulation   Chronic diastolic CHF  - CXR suggests pulmonary edema, but on exam he appears generally volume depleted  - TTE (07/31/16) with EF 55-60%, moderate concentric LVH, no WMA, not able to determine diastolic function, mild-mod MR, bioprosthetic AV okay, moderate TR, mod-severe LAE, severely elevated PA pressures - Managed at home with Lasix 40 mg BID - holding for now  - follow strict I/O's and daily wts - baseline wgt appears to be [employer]	[place]   09/11/2016 2208 09/20/16 0826  Weight: 73.9 kg (163 lb) 69.9 kg (154 lb)    Acute kidney injury - likely prerenal - hydrate and follow   Chronic atrial fibrillation - CHADS-VASc at least 4  - Managed with warfarin - holding for now due to high risk for fall/injury and bleeding - follow   COPD, OSA  No evidence of acute exacerbation at present   Chronic Anemia  - baseline is 8-9 range  - 4 units pRBCs for upper GIB in February 2018; had been cleared by GI to resume warfarin with BID Protonix; Hgb was 8.0 when discharged    DM  - A1c was 7.4% in January 2018 - CBG currently reasonably controlled   Chronic wounds  - Wounds noted to left foot and sacrum  - Has been receiving wound care and ampicillin  - Continue wound care, currently on Unasyn    DVT prophylaxis:  SCDs Code Status: FULL CODE Family Communication: no family present at time of exam  Disposition Plan:   Consultants:  none  Procedures: none  Antimicrobials:  none   Objective: Blood pressure (!) 116/58, pulse 64, temperature 98.3 F (36.8 C), temperature source Axillary, resp. rate 16, weight 69.9 kg (154 lb), SpO2 95 %.  Intake/Output Summary (Last 24 hours) at 09/20/16 1527 Last data filed at 09/20/16 1022  Gross per 24 hour  Intake              206 ml  Output              800 ml  Net              -594 ml   Filed Weights   08/24/2016 2208 09/20/16 0826  Weight: 73.9 kg (163 lb) 69.9 kg (154 lb)    Examination: General: requiring high level FM support for signif hypoxia  Lungs: poor air movement in B bases - no wheezing  Cardiovascular: Regular rate and rhythm without murmur  Abdomen: Nondistended, soft, bowel sounds positive, no rebound, no ascites, no appreciable mass Extremities: No significant cyanosis, clubbing, or edema bilateral lower extremities  CBC:  Recent Labs Lab 09/10/2016 1722 09/14/2016 1748 09/20/16 0205  WBC 12.9*  --  13.9*  NEUTROABS 7.5  --  11.0*  HGB 7.9* 9.2* 7.5*  HCT 28.7* 27.0* 27.0*  MCV 87.5  --  87.7  PLT 316  --  403   Basic Metabolic Panel:  Recent Labs Lab 08/21/2016 1722 09/12/2016 1748 09/20/16 0205  NA 139 138 142  K 4.0 4.2 4.0  CL 97* 96* 98*  CO2 34*  --  34*  GLUCOSE 112* 111* 117*  BUN 25* 33* 29*  CREATININE 1.29* 1.50* 1.47*  CALCIUM 9.2  --  9.1   GFR: Estimated Creatinine Clearance: 40.3 mL/min (A) (by C-G formula based on SCr of 1.47 mg/dL (H)).  Liver Function Tests:  Recent Labs Lab 08/26/2016 1722 09/20/16 0205  AST 50* 41  ALT 59 54  ALKPHOS 220* 202*  BILITOT 0.7 0.9  PROT 6.7 6.1*  ALBUMIN 2.2* 2.3*    Coagulation Profile:  Recent Labs Lab 08/29/2016 1951 09/20/16 0205  INR 2.21 2.23    HbA1C: Hgb A1c MFr Bld  Date/Time Value Ref Range Status  07/06/2016 04:33 AM 7.4 (H) 4.8 - 5.6 % Final    Comment:    (NOTE)         Pre-diabetes: 5.7 - 6.4         Diabetes: >6.4         Glycemic control for adults with diabetes: <7.0   06/02/2014 08:03 PM 10.7 (H) <5.7 % Final    Comment:    (NOTE)                                                                       According to the ADA Clinical Practice Recommendations for 2011, when HbA1c is used as a screening test:  >=6.5%   Diagnostic of Diabetes Mellitus           (if abnormal result is confirmed) 5.7-6.4%   Increased risk of developing  Diabetes Mellitus References:Diagnosis and Classification of Diabetes Mellitus,Diabetes KVQQ,5956,38(VFIEP  1):S62-S69 and Standards of Medical Care in         Diabetes - 2011,Diabetes GNFA,2130,86 (Suppl 1):S11-S61.     CBG:  Recent Labs Lab 09/14/2016 2040 08/29/2016 2343 09/20/16 0412 09/20/16 0740 09/20/16 1338  GLUCAP 131* 112* 119* 118* 130*    Recent Results (from the past 240 hour(s))  Body fluid culture     Status: None (Preliminary result)   Collection Time: 09/20/16  9:46 AM  Result Value Ref Range Status   Specimen Description PLEURAL RIGHT  Final   Special Requests NONE  Final   Gram Stain   Final    RARE WBC PRESENT, PREDOMINANTLY MONONUCLEAR NO ORGANISMS SEEN    Culture PENDING  Incomplete   Report Status PENDING  Incomplete     Scheduled Meds: . ampicillin-sulbactam (UNASYN) IV  3 g Intravenous Q6H  . docusate sodium  100 mg Oral BID  . donepezil  10 mg Oral QHS  . ezetimibe-simvastatin  1 tablet Oral QHS  . insulin aspart  0-9 Units Subcutaneous Q4H  . insulin glargine  5 Units Subcutaneous QHS  . lidocaine (PF)      . metoprolol tartrate  25 mg Oral BID  . multivitamin with minerals  1 tablet Oral Daily  . nystatin   Topical BID  . pantoprazole (PROTONIX) IV  40 mg Intravenous Q12H  . saccharomyces boulardii  250 mg Oral BID  . sodium chloride flush  3 mL Intravenous Q12H  . sodium chloride flush  3 mL Intravenous Q12H    LOS: 0 days   Cherene Altes, MD [facility] Office  [phone] Pager - Text Page per Shea Evans as per below:  On-Call/Text Page:      [email or web address]      password [email or web address]  If 7PM-7AM, please contact night-coverage [email or web address] Password [facility] 09/20/2016, 3:27 PM

## 2016-09-20 NOTE — Evaluation (Signed)
SLP Cancellation Note  Patient Details Name: [name]. MRN: [id number] DOB: [birth year]   Cancelled treatment:       Reason Eval/Treat Not Completed: Medical issues which prohibited therapy (pt required NT suctioning and had respiratory distress with possible secretion aspiration, also for thoracentesis, will follow up next date for po/eval readiness)   Claudie Fisherman, [facility] SLP [phone]

## 2016-09-20 NOTE — Plan of Care (Signed)
Problem: Education: Goal: Knowledge of [facility] Education information/materials will improve Outcome: Not Progressing Pt unable to participate at this time

## 2016-09-21 ENCOUNTER — Inpatient Hospital Stay (HOSPITAL_COMMUNITY): Payer: Medicare Other

## 2016-09-21 LAB — COMPREHENSIVE METABOLIC PANEL
ALT: 33 U/L (ref 17–63)
ANION GAP: 10 (ref 5–15)
AST: 41 U/L (ref 15–41)
Albumin: 1.9 g/dL — ABNORMAL LOW (ref 3.5–5.0)
Alkaline Phosphatase: 164 U/L — ABNORMAL HIGH (ref 38–126)
BILIRUBIN TOTAL: 0.8 mg/dL (ref 0.3–1.2)
BUN: 36 mg/dL — AB (ref 6–20)
CO2: 33 mmol/L — AB (ref 22–32)
Calcium: 9 mg/dL (ref 8.9–10.3)
Chloride: 99 mmol/L — ABNORMAL LOW (ref 101–111)
Creatinine, Ser: 1.2 mg/dL (ref 0.61–1.24)
GFR calc Af Amer: >60 mL/min (ref >60)
GFR, EST NON AFRICAN AMERICAN: 56 mL/min — AB (ref >60)
GLUCOSE: 96 mg/dL (ref 65–99)
POTASSIUM: 3.9 mmol/L (ref 3.5–5.1)
Sodium: 142 mmol/L (ref 135–145)
TOTAL PROTEIN: 5.6 g/dL — AB (ref 6.5–8.1)

## 2016-09-21 LAB — GLUCOSE, CAPILLARY
GLUCOSE-CAPILLARY: 103 mg/dL — AB (ref 65–99)
GLUCOSE-CAPILLARY: 69 mg/dL (ref 65–99)
GLUCOSE-CAPILLARY: 81 mg/dL (ref 65–99)
GLUCOSE-CAPILLARY: 82 mg/dL (ref 65–99)
GLUCOSE-CAPILLARY: 93 mg/dL (ref 65–99)
Glucose-Capillary: 101 mg/dL — ABNORMAL HIGH (ref 65–99)
Glucose-Capillary: 133 mg/dL — ABNORMAL HIGH (ref 65–99)
Glucose-Capillary: 84 mg/dL (ref 65–99)

## 2016-09-21 LAB — CBC
HEMATOCRIT: 24.1 % — AB (ref 39.0–52.0)
HEMOGLOBIN: 7 g/dL — AB (ref 13.0–17.0)
MCH: 25.4 pg — ABNORMAL LOW (ref 26.0–34.0)
MCHC: 28.6 g/dL — AB (ref 30.0–36.0)
MCV: 88.6 fL (ref 78.0–100.0)
Platelets: 217 10*3/uL (ref 150–400)
RBC: 2.72 MIL/uL — ABNORMAL LOW (ref 4.22–5.81)
RDW: 18.9 % — AB (ref 11.5–15.5)
WBC: 9.5 10*3/uL (ref 4.0–10.5)

## 2016-09-21 MED ORDER — CHLORHEXIDINE GLUCONATE 0.12 % MT SOLN
15.0000 mL | Freq: Two times a day (BID) | OROMUCOSAL | Status: DC
  Administered 2016-09-21 – 2016-09-28 (×14): 15 mL via OROMUCOSAL
  Filled 2016-09-21 (×12): qty 15

## 2016-09-21 MED ORDER — DEXTROSE 50 % IV SOLN
INTRAVENOUS | Status: AC
  Administered 2016-09-21: 25 mL
  Filled 2016-09-21: qty 50

## 2016-09-21 MED ORDER — BISACODYL 10 MG RE SUPP: 10.0000 mg | Freq: Every day | RECTAL | Status: DC | PRN

## 2016-09-21 MED ORDER — ORAL CARE MOUTH RINSE
15.0000 mL | Freq: Two times a day (BID) | OROMUCOSAL | Status: DC
  Administered 2016-09-23 – 2016-09-28 (×11): 15 mL via OROMUCOSAL

## 2016-09-21 NOTE — Progress Notes (Signed)
[name]  HER:[id number] DOB: 05/28/1935 DOA: 09/05/2016 PCP: [name], MD    Brief Narrative:  82 y.o. male with history of COPD, chronic diastolic CHF, CAD status post CABG, chronic atrial fibrillation on Coumadin, intermittent agitation, and chronic wounds involving the left foot and sacrum who presented to the ED from his SNF for evaluation of somnolence following an accidental overdose of Ativan, Haldol, and Benadryl. Patient reportedly is treated at his nursing facility with a compounded topical cream containing 1 mg Ativan, 1 mg Haldol, and 25 mg Benadryl per dose. He typically receives this twice daily as needed for agitation, but was inadvertently given a 10x dose by a new nurse today, and it was administered orally rather than topically. Patient was noted to be increasingly somnolent at the nursing facility and EMS was called for transport to the hospital. Per the report of his family at the bedside, he had begun to show good improvement at the nursing facility after being discharged from the hospital on 08/09/2016 after nonoperative management of right humerus fracture, treatment of bleeding ulcer with endoscopic clipping and 4 units of packed red blood cells, and drainage of a right pleural effusion.   Subjective: Heavily sedate at time of my visit.  Does not awaken to exam or tactile stimuli.  In no respiratory distress.  No apparent pain.  No family at bedside.      Assessment & Plan:  Accidental overdose  Cont supportive care - remains sedate but hemodynamically stable at this time   Agitation  Hold all sedating meds in setting of signif accidental OD - soft restraints as needed to protect supportive equipment for periods of agitation   Acute on chronic hypoxic respiratory failure - ?Aspiration requires 2L O2 at baseline - acute decline likely due to respiratory suppression w/ OD of sedating meds - wean O2 as able but presently  still requiring FM oxygen, w/ rapid severe desaturation when he pulls off his mask - cont empiric abx for possible aspiration - f/u CXR his morning w/o signif change   R Pleural effusion noted on admission CXR - IR thoracentesis performed 3/31 - full fluid studies not sent apparently - no new information on culture of fluid   Chronic diastolic CHF  - CXR suggests pulmonary edema, but on exam he appears generally volume depleted  - TTE (07/31/16) with EF 55-60%, moderate concentric LVH, no WMA, not able to determine diastolic function, mild-mod MR, bioprosthetic AV okay, moderate TR, mod-severe LAE, severely elevated PA pressures - Managed at home with Lasix 40 mg BID - holding for now  - follow strict I/O's and daily wts - baseline wgt appears to be ~79 [facility] Weights   08/23/2016 2208 09/20/16 0826  Weight: 73.9 kg (163 lb) 69.9 kg (154 lb)    Acute kidney injury - likely prerenal - resolving w/ volume expansion   Recent Labs Lab 09/03/2016 1722 08/31/2016 1748 09/20/16 0205 09/21/16 0340  CREATININE 1.29* 1.50* 1.47* 1.20    Chronic atrial fibrillation - CHADS-VASc at least 4  - Managed with warfarin - holding for now due to high risk for fall/injury and bleeding - follow   COPD, OSA  No evidence of acute exacerbation at present   Chronic Anemia  - baseline is 8-9 range  - 4 units pRBCs for upper GIB in February 2018; had been cleared by GI to resume warfarin with BID Protonix; Hgb was 8.0 when discharged   -  slowly trending downward w/ volume expansion - follow and transfuse if drops below 7.0   Recent Labs Lab 09/04/2016 1722 08/25/2016 1748 09/20/16 0205 09/21/16 0340  HGB 7.9* 9.2* 7.5* 7.0*    DM  - A1c was 7.4% in January 2018 - CBG controlled but borderline hypoglycemic - stop lantus and follow   Chronic wounds  - Wounds noted to left foot and sacrum  - Has been receiving wound care and ampicillin  - Continue wound care, currently on Unasyn    DVT prophylaxis:  SCDs Code Status: FULL CODE Family Communication: no family present at time of exam  Disposition Plan: SDU until more alert   Consultants:  none  Procedures: none  Antimicrobials:  Unasyn 3/30 >  Objective: Blood pressure (!) 135/55, pulse 65, temperature (!) 96.7 F (35.9 C), temperature source Oral, resp. rate 16, height 5\' 10"  (1.778 m), weight 69.9 kg (154 lb), SpO2 94 %.  Intake/Output Summary (Last 24 hours) at 09/21/16 1551 Last data filed at 09/21/16 1456  Gross per 24 hour  Intake                0 ml  Output             1550 ml  Net            -1550 ml   Filed Weights   08/24/2016 2208 09/20/16 0826  Weight: 73.9 kg (163 lb) 69.9 kg (154 lb)    Examination: General: high level FM support for signif hypoxia continues  Lungs: poor air movement in B bases - no wheeze Cardiovascular: Regular rate and rhythm without murmur  Abdomen: Nondistended, soft, bowel sounds hypoactive, no rebound, no ascites, no appreciable mass Extremities: No significant edema bilateral lower extremities  CBC:  Recent Labs Lab 09/18/2016 1722 08/24/2016 1748 09/20/16 0205 09/21/16 0340  WBC 12.9*  --  13.9* 9.5  NEUTROABS 7.5  --  11.0*  --   HGB 7.9* 9.2* 7.5* 7.0*  HCT 28.7* 27.0* 27.0* 24.1*  MCV 87.5  --  87.7 88.6  PLT 316  --  315 277   Basic Metabolic Panel:  Recent Labs Lab 09/07/2016 1722 09/08/2016 1748 09/20/16 0205 09/21/16 0340  NA 139 138 142 142  K 4.0 4.2 4.0 3.9  CL 97* 96* 98* 99*  CO2 34*  --  34* 33*  GLUCOSE 112* 111* 117* 96  BUN 25* 33* 29* 36*  CREATININE 1.29* 1.50* 1.47* 1.20  CALCIUM 9.2  --  9.1 9.0   GFR: Estimated Creatinine Clearance: 49.4 mL/min (by C-G formula based on SCr of 1.2 mg/dL).  Liver Function Tests:  Recent Labs Lab 09/18/2016 1722 09/20/16 0205 09/21/16 0340  AST 50* 41 41  ALT 59 54 33  ALKPHOS 220* 202* 164*  BILITOT 0.7 0.9 0.8  PROT 6.7 6.1* 5.6*  ALBUMIN 2.2* 2.3* 1.9*    Coagulation Profile:  Recent Labs Lab  08/24/2016 1951 09/20/16 0205  INR 2.21 2.23    HbA1C: Hgb A1c MFr Bld  Date/Time Value Ref Range Status  07/06/2016 04:33 AM 7.4 (H) 4.8 - 5.6 % Final    Comment:    (NOTE)         Pre-diabetes: 5.7 - 6.4         Diabetes: >6.4         Glycemic control for adults with diabetes: <7.0   06/02/2014 08:03 PM 10.7 (H) <5.7 % Final    Comment:    (NOTE)  According to the ADA Clinical Practice Recommendations for 2011, when HbA1c is used as a screening test:  >=6.5%   Diagnostic of Diabetes Mellitus           (if abnormal result is confirmed) 5.7-6.4%   Increased risk of developing Diabetes Mellitus References:Diagnosis and Classification of Diabetes Mellitus,Diabetes HOOI,7579,72(QASUO 1):S62-S69 and Standards of Medical Care in         Diabetes - 2011,Diabetes RVIF,5379,43 (Suppl 1):S11-S61.     CBG:  Recent Labs Lab 09/21/16 0014 09/21/16 0512 09/21/16 0848 09/21/16 1225 09/21/16 1320  GLUCAP 93 103* 82 69 81    Recent Results (from the past 240 hour(s))  Body fluid culture     Status: None (Preliminary result)   Collection Time: 09/20/16  9:46 AM  Result Value Ref Range Status   Specimen Description PLEURAL RIGHT  Final   Special Requests NONE  Final   Gram Stain   Final    RARE WBC PRESENT, PREDOMINANTLY MONONUCLEAR NO ORGANISMS SEEN    Culture NO GROWTH < 24 HOURS  Final   Report Status PENDING  Incomplete     Scheduled Meds: . ampicillin-sulbactam (UNASYN) IV  3 g Intravenous Q6H  . docusate sodium  100 mg Oral BID  . donepezil  10 mg Oral QHS  . ezetimibe-simvastatin  1 tablet Oral QHS  . insulin aspart  0-9 Units Subcutaneous Q4H  . insulin glargine  5 Units Subcutaneous QHS  . metoprolol tartrate  25 mg Oral BID  . multivitamin with minerals  1 tablet Oral Daily  . nystatin   Topical BID  . pantoprazole (PROTONIX) IV  40 mg Intravenous Q12H    LOS: 1 day   Cherene Altes,  MD [facility] Office  [phone] Pager - Text Page per Amion as per below:  On-Call/Text Page:      [email or web address]      password [email or web address]  If 7PM-7AM, please contact night-coverage [email or web address] Password [facility] 09/21/2016, 3:51 PM

## 2016-09-21 NOTE — Progress Notes (Signed)
09/21/2016  Central monitor called patient had 5 of [employer]el[employer] Ideoventricular Ryhthm and five beat Vtach at 1801. Notified Dr Thereasa Solo via [place] at [phone]. [facility] RN.

## 2016-09-21 NOTE — Evaluation (Signed)
Clinical/Bedside Swallow Evaluation Patient Details  Name: [name]. MRN: [id number] Date of Birth: 1934/08/10  Today's Date: 09/21/2016 Time: SLP Start Time (ACUTE ONLY): 1225 SLP Stop Time (ACUTE ONLY): 1232 SLP Time Calculation (min) (ACUTE ONLY): 7 min  Past Medical History:  Past Medical History:  Diagnosis Date  . Aortal stenosis 2006   biophrostetic  . Arthritis   . Atrial fibrillation ([place])    onset after CABG--- coumadin management at [facility] clinic  . Carotid artery disease ([place]) 09/27/2010   Carotid US 12/17: bilat ICA 40-59 >> 1 year FU  . CHF (congestive heart failure) ([place])    abter CABG  . Complete heart block ([place])   . Coronary artery disease   . DM type 2 (diabetes mellitus, type 2) ([place])    used to see Dr Meredith Pel, now f/u by Dr Larose Kells  . Erectile dysfunction   . Gangrene ([place])   . History of nephrolithiasis   . HTN (hypertension)   . Hyperlipidemia   . Memory loss   . Obesity   . OSA (obstructive sleep apnea)    not using CPAP  . Pacemaker 2/07   VVI  . PVD (peripheral vascular disease) ([name])    CT angio showed- vascular insuff, intestine and RAS bilaterally, andgiogram 02/2009 severe PVD medical management  . S/P aortic valve replacement   . Sick sinus syndrome ([name])    [name]  . Skin cancer    surgery (nose) 09-2010   Past Surgical History:  Past Surgical History:  Procedure Laterality Date  . ABDOMINAL AORTAGRAM N/A 11/10/2012   Procedure: ABDOMINAL Maxcine Ham;  Surgeon: Serafina Mitchell, MD;  Location: [facility] CATH LAB;  Service: Cardiovascular;  Laterality: N/A;  . AMPUTATION  01/2010   great toe  . AMPUTATION Left 07/16/2016   Procedure: RECECTION OF LEFT GREAT TOE METATARSAL HEAD and sesamoid;  Surgeon: Waynetta Sandy, MD;  Location: [place];  Service: Vascular;  Laterality: Left;  . AMPUTATION Left 07/31/2016   Procedure: SECOND TOE AMPUTATION;  Surgeon: Serafina Mitchell, MD;  Location: [place] OR;  Service: Vascular;  Laterality:  Left;  . ANGIOPLASTY / STENTING FEMORAL  02/10/10   Left femoral  . ANGIOPLASTY ILLIAC ARTERY Left 07/16/2016   Procedure: drug coated Balloon ANGIOPLASTY of left superficial femoral artry stent;  Surgeon: Waynetta Sandy, MD;  Location: [place];  Service: Vascular;  Laterality: Left;  . AORTIC VALVE REPLACEMENT     biophrostetic for Ao Stenosis 2006  . AORTIC VALVE REPLACEMENT  01/17/2011   S/P redo median sternotomy, extracorporeal circulation, redo Aortic Valve Replacement using a 23-mm Edwards pericardial Magna-Ease valve, Dr [place] Gallo  . AORTOGRAM N/A 07/16/2016   Procedure: left lower extremity angiogram;  Surgeon: Waynetta Sandy, MD;  Location: [place];  Service: Vascular;  Laterality: N/A;  . APPLICATION OF WOUND VAC Left 07/31/2016   Procedure: APPLICATION OF WOUND VAC;  Surgeon: Serafina Mitchell, MD;  Location: [place];  Service: Vascular;  Laterality: Left;  . CATARACT EXTRACTION, BILATERAL    . CORONARY ARTERY BYPASS GRAFT  2006  . EP IMPLANTABLE DEVICE N/A 06/06/2016   MDT Generator change with Advisa SR MRI device implanted by Dr Rayann Heman  . ESOPHAGOGASTRODUODENOSCOPY (EGD) WITH PROPOFOL N/A 08/07/2016   Procedure: ESOPHAGOGASTRODUODENOSCOPY (EGD) WITH PROPOFOL;  Surgeon: Mauri Pole, MD;  Location: [facility];  Service: Endoscopy;  Laterality: N/A;  . EYE SURGERY    . ILIAC ATHERECTOMY Left 07/16/2016   Procedure: Angioplasty and  ATHERECTOMY of left anterior tibial artery;  Surgeon: Waynetta Sandy, MD;  Location: [place];  Service: Vascular;  Laterality: Left;  . INCISE AND DRAIN ABCESS Right May 2014   right heel   . LOWER EXTREMITY ANGIOGRAM Left 11/23/2012   Procedure: LOWER EXTREMITY ANGIOGRAM;  Surgeon: Serafina Mitchell, MD;  Location: [facility] C[facility] LAB;  Service: Cardiovascular;  Laterality: Left;  . PACEMAKER PLACEMENT  07/2005   Medtronic Sigma SR implanted by Dr Olevia Perches  . PERIPHERAL VASCULAR CATHETERIZATION N/A 07/10/2016   Procedure: Abdominal  Aortogram;  Surgeon: Conrad Revere, MD;  Location: [place] CV LAB;  Service: Cardiovascular;  Laterality: N/A;  . PERIPHERAL VASCULAR CATHETERIZATION Bilateral 07/10/2016   Procedure: Lower Extremity Angiography;  Surgeon: Conrad Gumbranch, MD;  Location: [place] CV LAB;  Service: Cardiovascular;  Laterality: Bilateral;  . TONSILLECTOMY    . ULTRASOUND GUIDANCE FOR VASCULAR ACCESS Bilateral 07/16/2016   Procedure: ULTRASOUND GUIDANCE FOR VASCULAR ACCESS of right common femoral and left dorsalis pedis;  Surgeon: Waynetta Sandy, MD;  Location: [facility] OR;  Service: Vascular;  Laterality: Bilateral;  . WOUND DEBRIDEMENT Left 07/31/2016   Procedure: DEBRIDEMENT GREAT TOE AMPUTATION SITE;  Surgeon: Serafina Mitchell, MD;  Location: [place];  Service: Vascular;  Laterality: Left;   HPI:  82 y.o.malewith history of COPD, chronic diastolic CHF, CAD status post CABG, chronic atrial fibrillation on Coumadin, intermittent agitation, and chronic wounds involving the left foot and sacrum who presentedto the EDfrom his SNF for evaluation of somnolence following an accidental overdose of Ativan, Haldol, and Benadryl. Patient was noted to be increasingly somnolent at the nursing facility and EMS was called for transport to the hospital. Per the report of his family at the bedside, he had begun to show good improvement at the nursing facility after being discharged from the hospital on 08/09/2016 after nonoperative management of right humerus fracture, treatment of bleeding ulcer with endoscopic clipping and 4 units of packed red blood cells, and drainage of a right pleural effusion.Per MD equires 2LO2 at baseline - acute decline likely due to respiratory suppression w/ OD of 2 sedating meds - wean O2 as able but presently still requiring FM oxygen, w/ rapid severe desaturation when he pulls off his mask - cont empiric abx for possible aspiration. Seen during prior admission 08/01/16, with findings of functional  swallow with sufficient oral attention, adequate mastication. Regular solids, thin liquids, meds whole in puree recommended. CXR 09/21/16 Pleural fluid on the right is similar in amount. Presumed small left pleural effusion is stable, Persistent lung opacities are noted. Appearance suggests a combination of pulmonary edema and basilar atelectasis. Pneumonia is possible.   Assessment / Plan / Recommendation Clinical Impression  Patient presents with severe risk for aspiration given current mentation and respiratory status. RN present for initial assessment and reports pt has been tolerating time off venturi mask for brief periods. Mask removed for evaluation; pt alert and follows some basic commands, however quickly becomes verbally agitated and refuses to follow remaining commands. Immediate coughing after presentation of ice chip, thin liquids, with drop in 02 sats to 79, requiring replacement of venturi mask. Pt not appropriate for continued PO trials. Recommend pt remain NPO, medications via alternative means. SLP will f/u next day for PO readiness.  SLP Visit Diagnosis: Dysphagia, unspecified (R13.10)    Aspiration Risk  Severe aspiration risk;Risk for inadequate nutrition/hydration    Diet Recommendation NPO   Medication Administration: Via alternative means    Other  Recommendations Oral Care Recommendations: Oral care QID   Follow up Recommendations Other (comment) (tba)      Frequency and Duration min 2x/week  2 weeks       Prognosis Prognosis for Safe Diet Advancement: Good Barriers to Reach Goals: Cognitive deficits      Swallow Study   General Date of Onset: 09/06/2016 HPI: 82 y.o.malewith history of COPD, chronic diastolic CHF, CAD status post CABG, chronic atrial fibrillation on Coumadin, intermittent agitation, and chronic wounds involving the left foot and sacrum who presentedto the EDfrom his SNF for evaluation of somnolence following an accidental overdose of Ativan,  Haldol, and Benadryl. Patient was noted to be increasingly somnolent at the nursing facility and EMS was called for transport to the hospital. Per the report of his family at the bedside, he had begun to show good improvement at the nursing facility after being discharged from the hospital on 08/09/2016 after nonoperative management of right humerus fracture, treatment of bleeding ulcer with endoscopic clipping and 4 units of packed red blood cells, and drainage of a right pleural effusion.Per MD equires 2LO2 at baseline - acute decline likely due to respiratory suppression w/ OD of 2 sedating meds - wean O2 as able but presently still requiring FM oxygen, w/ rapid severe desaturation when he pulls off his mask - cont empiric abx for possible aspiration. Seen during prior admission 08/01/16, with findings of functional swallow with sufficient oral attention, adequate mastication. Regular solids, thin liquids, meds whole in puree recommended. CXR 09/21/16 Pleural fluid on the right is similar in amount. Presumed small left pleural effusion is stable, Persistent lung opacities are noted. Appearance suggests a combination of pulmonary edema and basilar atelectasis. Pneumonia is possible. Type of Study: Bedside Swallow Evaluation Previous Swallow Assessment: see HPI Diet Prior to this Study: NPO Temperature Spikes Noted: No Respiratory Status: Venti-mask History of Recent Intubation: No Behavior/Cognition: Alert;Agitated;Requires cueing Oral Cavity Assessment: Within Functional Limits Oral Care Completed by SLP: Other (Comment) (pt agitated) Oral Cavity - Dentition: Other (Comment) (difficult to assess) Vision: Functional for self-feeding Self-Feeding Abilities: Needs assist Patient Positioning: Upright in bed Baseline Vocal Quality: Hoarse Volitional Cough: Cognitively unable to elicit Volitional Swallow: Unable to elicit    Oral/Motor/Sensory Function Overall Oral Motor/Sensory Function: Other  (comment) (limited assessment; pt refused most commands)   Ice Chips Ice chips: Impaired Presentation: Spoon Pharyngeal Phase Impairments: Cough - Immediate;Change in Vital Signs   Thin Liquid Thin Liquid: Impaired Presentation: Cup Pharyngeal  Phase Impairments: Cough - Immediate;Cough - Delayed;Change in Vital Signs    Nectar Thick Nectar Thick Liquid: Not tested   Honey Thick Honey Thick Liquid: Not tested   Puree Puree: Not tested   Solid   GO   Solid: Not tested       Deneise Lever, MS CF-SLP Speech-Language Pathologist [phone] Aliene Altes 09/21/2016,3:16 PM

## 2016-09-21 DEATH — deceased

## 2016-09-22 LAB — GLUCOSE, CAPILLARY
GLUCOSE-CAPILLARY: 107 mg/dL — AB (ref 65–99)
GLUCOSE-CAPILLARY: 153 mg/dL — AB (ref 65–99)
GLUCOSE-CAPILLARY: 114 mg/dL — AB (ref 65–99)
GLUCOSE-CAPILLARY: 145 mg/dL — AB (ref 65–99)
Glucose-Capillary: 105 mg/dL — ABNORMAL HIGH (ref 65–99)

## 2016-09-22 LAB — CBC
HEMATOCRIT: 24.8 % — AB (ref 39.0–52.0)
HEMOGLOBIN: 7 g/dL — AB (ref 13.0–17.0)
MCH: 25 pg — AB (ref 26.0–34.0)
MCHC: 28.2 g/dL — ABNORMAL LOW (ref 30.0–36.0)
MCV: 88.6 fL (ref 78.0–100.0)
PLATELETS: 237 10*3/uL (ref 150–400)
RBC: 2.8 MIL/uL — AB (ref 4.22–5.81)
RDW: 18.6 % — ABNORMAL HIGH (ref 11.5–15.5)
WBC: 12.7 10*3/uL — AB (ref 4.0–10.5)

## 2016-09-22 LAB — HEMOGLOBIN A1C
HEMOGLOBIN A1C: 5.4 % (ref 4.8–5.6)
MEAN PLASMA GLUCOSE: 108 mg/dL

## 2016-09-22 LAB — PATHOLOGIST SMEAR REVIEW

## 2016-09-22 MED ORDER — DONEPEZIL HCL 10 MG PO TABS
10.0000 mg | ORAL_TABLET | Freq: Every day | ORAL | Status: DC
  Administered 2016-09-22 – 2016-09-27 (×6): 10 mg via ORAL
  Filled 2016-09-22 (×7): qty 1

## 2016-09-22 MED ORDER — METOPROLOL TARTRATE 25 MG PO TABS
25.0000 mg | ORAL_TABLET | Freq: Two times a day (BID) | ORAL | Status: DC
  Administered 2016-09-22 – 2016-09-28 (×12): 25 mg via ORAL
  Filled 2016-09-22 (×13): qty 1

## 2016-09-22 MED ORDER — INSULIN ASPART 100 UNIT/ML ~~LOC~~ SOLN: 0.0000 [IU] | Freq: Every day | SUBCUTANEOUS | Status: DC

## 2016-09-22 MED ORDER — ACETAMINOPHEN 325 MG PO TABS
650.0000 mg | ORAL_TABLET | Freq: Four times a day (QID) | ORAL | Status: DC | PRN
  Administered 2016-09-27: 650 mg via ORAL
  Filled 2016-09-22: qty 2

## 2016-09-22 MED ORDER — QUETIAPINE FUMARATE 50 MG PO TABS
50.0000 mg | ORAL_TABLET | Freq: Every day | ORAL | Status: DC
  Administered 2016-09-22 – 2016-09-27 (×6): 50 mg via ORAL
  Filled 2016-09-22 (×7): qty 1

## 2016-09-22 MED ORDER — COLLAGENASE 250 UNIT/GM EX OINT
TOPICAL_OINTMENT | Freq: Every day | CUTANEOUS | Status: DC
  Administered 2016-09-23 – 2016-09-27 (×5): via TOPICAL
  Administered 2016-09-28: 1 via TOPICAL
  Administered 2016-09-29: 09:00:00 via TOPICAL
  Administered 2016-09-30: 1 via TOPICAL
  Administered 2016-10-01: 08:00:00 via TOPICAL
  Administered 2016-10-03: 1 via TOPICAL
  Filled 2016-09-22 (×4): qty 30

## 2016-09-22 MED ORDER — INSULIN ASPART 100 UNIT/ML ~~LOC~~ SOLN
0.0000 [IU] | Freq: Three times a day (TID) | SUBCUTANEOUS | Status: DC
  Administered 2016-09-22: 2 [IU] via SUBCUTANEOUS
  Administered 2016-09-23: 1 [IU] via SUBCUTANEOUS
  Administered 2016-09-24: 3 [IU] via SUBCUTANEOUS
  Administered 2016-09-24: 2 [IU] via SUBCUTANEOUS
  Administered 2016-09-24: 1 [IU] via SUBCUTANEOUS
  Administered 2016-09-25: 3 [IU] via SUBCUTANEOUS
  Administered 2016-09-25 (×2): 2 [IU] via SUBCUTANEOUS
  Administered 2016-09-26: 1 [IU] via SUBCUTANEOUS
  Administered 2016-09-26 – 2016-09-27 (×3): 2 [IU] via SUBCUTANEOUS
  Administered 2016-09-27: 3 [IU] via SUBCUTANEOUS

## 2016-09-22 MED ORDER — PANTOPRAZOLE SODIUM 40 MG PO TBEC
40.0000 mg | DELAYED_RELEASE_TABLET | Freq: Two times a day (BID) | ORAL | Status: DC
Start: 2016-09-22 — End: 2016-09-28
  Administered 2016-09-22 – 2016-09-27 (×11): 40 mg via ORAL
  Filled 2016-09-22 (×11): qty 1

## 2016-09-22 NOTE — Progress Notes (Signed)
Patient transferred to [phone] with all belongings (supplies from room). Bed left in lowest position with bed exit alarm on high sensitivity. Patient left with venti mask on and O2 at 10lpm. Patient placed onto portable telebox provided by 6E and called into central monitoring with RN Laury Axon assisting with 2nd tele confirmation at bedside. Patient left with callbell in hand and 6E RN in room with patient.

## 2016-09-22 NOTE — Progress Notes (Signed)
CSW received consult to assist with advanced directives- at this time patient is not fully oriented and would not be able to complete advanced directives.  When pt becomes oriented please consult chaplain service to assist with completing advanced directive paperwork  [name], [place] Social Worker [phone]

## 2016-09-22 NOTE — Progress Notes (Signed)
  Speech Language Pathology Treatment: Dysphagia  Patient Details Name: [name]. MRN: [id number] DOB: 09-Mar-1935 Today's Date: 09/22/2016 Time: 3790-2409 SLP Time Calculation (min) (ACUTE ONLY): 10 min  Assessment / Plan / Recommendation Clinical Impression  Pt demonstrates poor attention to tasks - distracted by self needs of discomfort and desire for ice and water.  Pt repeatedly verbalizes even with po in mouth despite total cues to cease talking!   He would accept intake of applesauce, nectar juice, ice chips.    He presents with dry weak cough across 25% of boluses - weak cough noted post=swallow that is not observed without po consumption by this SLP.   Pt surprisingly consumed 4 ounces of nectar thickened apple juice via straw sequentially without indications of deficits - absolutely no coughing observed.   SLP can not rule out silent aspiration and pt is a full code.    MD please determine plan you desire:   Options :   PO of Nectar thickened liquids and ice  To mitigate aspiration risk  Or   Continue NPO except ice chips and MBS when pt will cooperate.   Pt's mentation/agitation/decreased focus/poor bed positioning increases aspiration risk regardless of consistency.         HPI HPI: 82 y.o.malewith history of COPD, chronic diastolic CHF, CAD status post CABG, chronic atrial fibrillation on Coumadin, intermittent agitation, and chronic wounds involving the left foot and sacrum who presentedto the EDfrom his SNF for evaluation of somnolence following an accidental overdose of Ativan, Haldol, and Benadryl. Patient was noted to be increasingly somnolent at the nursing facility and EMS was called for transport to the hospital. Per the report of his family at the bedside, he had begun to show good improvement at the nursing facility after being discharged from the hospital on 08/09/2016 after nonoperative management of right humerus fracture, treatment of bleeding ulcer  with endoscopic clipping and 4 units of packed red blood cells, and drainage of a right pleural effusion.Per MD equires 2LO2 at baseline - acute decline likely due to respiratory suppression w/ OD of 2 sedating meds - wean O2 as able but presently still requiring FM oxygen, w/ rapid severe desaturation when he pulls off his mask - cont empiric abx for possible aspiration. Seen during prior admission 08/01/16, with findings of functional swallow with sufficient oral attention, adequate mastication. Regular solids, thin liquids, meds whole in puree recommended. CXR 09/21/16 Pleural fluid on the right is similar in amount. Presumed small left pleural effusion is stable, Persistent lung opacities are noted. Appearance suggests a combination of pulmonary edema and basilar atelectasis. Pneumonia is possible.      SLP Plan  Continue with current plan of care       Recommendations  Diet recommendations: Other(comment) (ice chips only = options nectar diet to mitigate risk vs npo x ice and MBS (if pt will participate)) Medication Administration: Via alternative means Compensations: Minimize environmental distractions;Slow rate Postural Changes and/or Swallow Maneuvers: Seated upright 90 degrees;Upright 30-60 min after meal                Oral Care Recommendations: Oral care QID Follow up Recommendations: Other (comment) (tba) SLP Visit Diagnosis: Dysphagia, unspecified (R13.10) Plan: Continue with current plan of care       [place], [place], [place] [facility] SLP [phone]

## 2016-09-22 NOTE — Consult Note (Signed)
[place] Nurse wound consult note Reason for Consult: multiple wounds, from SNF Wound type: Gluteal fold;fissure: 4cm x 2cm x 0.1cm; pink, moist, clean Right heel: unstageable pressure injury: 4cm x 3cm x 0cm; 100% black stable eschar Left great toe amputation site: 4cm x 1.5cm x 0.5cm; 95% pink, moist, early granulation with 5% yellow at the proximal wound edge Dorsal proximal medial right foot: 2cm x 2cm, 0.3cm connects with distal amputation site wound, intact skin bridge between: 100% yellow Right 2nd toe: scabbed 1cm x 0.5cm x 0cm Right malleolus: 0.5cm x 0.5cm x 0; 100% hard/yellow Pressure Injury POA: Yes Measurement: see above Wound bed:see above Drainage (amount, consistency, odor) sacrum, left toe, right heel, right ankle,no drainage.  The left foot wounds have moderate to heavy drainage, yellow, no odor Periwound:intact, blanchable redness noted apex of the gluteal fold, some maceration in the gluteal fold  Dressing procedure/placement/frequency: 1. Silver hydrofiber in the gluteal fold, cover with foam change every 3 days. Hoping this will dry this area up a bit. 2. Low air loss mattress for pressure redistribution 3. Prevalaon boots in place already for heel pressure redistribution . 4. Enzymatic debridement ointment to the proximal left foot wound, moist gauze. Change daily. 5. Pack left distal foot wound with silver hydrofiber for antimicrobial effects and to manage drainage. 6. No topical care needed for right toe or right ankle, they are stable 7. Paint right heel with betadine daily and allow to air dry.  Discussed POC with patient and bedside nurse.  Re consult if needed, will not follow at this time. Thanks  Malacai Grantz [employer], RN,CWOCN, CNS [phone])

## 2016-09-22 NOTE — Progress Notes (Signed)
Patient had 10 beats vtach, was asymptomatic, have notified MD

## 2016-09-22 NOTE — Progress Notes (Addendum)
Initial Nutrition Assessment  DOCUMENTATION CODES:   Not applicable  INTERVENTION:    Advance diet as medically appropriate, add interventions accordingly  NUTRITION DIAGNOSIS:   Inadequate oral intake related to inability to eat as evidenced by NPO status  GOAL:   Patient will meet greater than or equal to 90% of their needs  MONITOR:   Diet advancement, PO intake, Supplement acceptance, Labs, Weight trends, Skin, I & O's  REASON FOR ASSESSMENT:   Malnutrition Screening Tool  ASSESSMENT:   82 y.o. Male with medical history significant for COPD with chronic respiratory failure, chronic diastolic CHF, CAD status post CABG, chronic atrial fibrillation on Coumadin, intermittent agitation, and chronic wounds involving the left foot and sacrum who presents to the emergency department from his SNF for evaluation of somnolence following an accident abnormal overdose of Ativan, Haldol, and Benadryl.  RD unable to obtain nutrition history.  Pt very confused.  On Venti mask.  Has safety mittens. S/p bedside swallow evaluation per Speech Path 4/1.  Rec NPO given current mentation. S/p R thoracentesis for pleural effusion 3/31. Labs and medications reviewed. CBG's [id number].  RD unable to complete Nutrition-Focused physical exam at this time.   Diet Order:  Diet NPO time specified Except for: Ice Chips  Skin:   L foot wound on left amputated toe  Last BM:  PTA  Height:   Ht Readings from Last 1 Encounters:  09/20/16 5\' 10"  (1.778 m)    Weight:   Wt Readings from Last 1 Encounters:  09/20/16 154 lb (69.9 kg)    Ideal Body Weight:  75.4 kg  BMI:  Body mass index is 22.1 kg/m.  Estimated Nutritional Needs:   Kcal:  3559-7416  Protein:  90-100 gm  Fluid:  1.7-1.9 L  EDUCATION NEEDS:   No education needs identified at this time  Arthur Holms, RD, LDN Pager #: [phone] After-Hours Pager #: [phone]

## 2016-09-22 NOTE — Progress Notes (Signed)
[place]  KKX:[id number] DOB: 08/03/1934 DOA: 09/01/2016 PCP: [name], MD    Brief Narrative:  82 y.o. male with history of COPD, chronic diastolic CHF, CAD status post CABG, chronic atrial fibrillation on Coumadin, intermittent agitation, and chronic wounds involving the left foot and sacrum who presented to the ED from his SNF for evaluation of somnolence following an accidental overdose of Ativan, Haldol, and Benadryl. Patient reportedly is treated at his nursing facility with a compounded topical cream containing 1 mg Ativan, 1 mg Haldol, and 25 mg Benadryl per dose. He typically receives this twice daily as needed for agitation, but was inadvertently given a 10x dose by a new nurse today, and it was administered orally rather than topically. Patient was noted to be increasingly somnolent at the nursing facility and EMS was called for transport to the hospital. Per the report of his family at the bedside, he had begun to show good improvement at the nursing facility after being discharged from the hospital on 08/09/2016 after nonoperative management of right humerus fracture, treatment of bleeding ulcer with endoscopic clipping and 4 units of packed red blood cells, and drainage of a right pleural effusion.   Subjective:   Assessment & Plan:  Accidental overdose / Agitation  Cont supportive care - mental status markedly improved today - resume very low dose meds per usual routine to address agitation - cont soft restraints for now to protect monitoring equipment/IV   Acute on chronic hypoxic respiratory failure - ?Aspiration requires 2L O2 at baseline - acute decline likely due to respiratory suppression w/ OD of sedating meds - cont empiric abx for possible aspiration to complete a 5 day course - f/u CXR 4/1 w/o signif change - no longer requiring FM O2 as mental status improves - titrate back to home 2L O2 dose and follow   R Pleural  effusion noted on admission CXR - IR thoracentesis performed 3/31 - full fluid studies not sent apparently - no new information on culture of fluid   Chronic diastolic CHF  - TTE (06/28/94) with EF 55-60%, moderate concentric LVH, no WMA, not able to determine diastolic function, mild-mod MR, bioprosthetic AV okay, moderate TR, mod-severe LAE, severely elevated PA pressures - Managed at home with Lasix 40 mg BID - holding for now due to poor/no intake  - follow strict I/O's and daily wts - baseline wgt appears to be ~79 [facility] Weights   08/30/2016 2208 09/20/16 0826  Weight: 73.9 kg (163 lb) 69.9 kg (154 lb)    Acute kidney injury - likely prerenal - resolving w/ volume expansion - recheck in AM   Recent Labs Lab 09/02/2016 1722 09/03/2016 1748 09/20/16 0205 09/21/16 0340  CREATININE 1.29* 1.50* 1.47* 1.20    Chronic atrial fibrillation - CHADS-VASc at least 4  - tx with warfarin chronically - holding for now due to high risk for fall/injury and bleeding   Nutrition  Difficult to fully assess swallowing function due to agitation and inability to fully cooperate w/ SLP eval - does appear however that he is likley safe for nectar thick liquids for now - will begin and follow - more extensive SLP eval before diet advanced further when he is more cooperative   COPD, OSA  No evidence of acute exacerbation at present   Chronic Anemia  - baseline is 8-9 range  - 4 units pRBCs for upper GIB in February 2018; had been cleared by GI  to resume warfarin with BID Protonix; Hgb was 8.0 when discharged   -slowly trending downward w/ volume expansion - follow and transfuse if drops below 7.0 - stable for now   Recent Labs Lab 09/14/2016 1722 09/07/2016 1748 09/20/16 0205 09/21/16 0340 09/22/16 0353  HGB 7.9* 9.2* 7.5* 7.0* 7.0*    DM  - A1c was 7.4% in January 2018 - CBG controlled on SSI only   Multiple Chronic wounds  See detailed not per [facility] - wound care ongoing as per WOC  recommendations   DVT prophylaxis: SCDs Code Status: FULL CODE Family Communication: no family present at time of exam  Disposition Plan: stable for transfer to tele bed   Consultants:  none  Procedures: none  Antimicrobials:  Unasyn 3/30 >  Objective: Blood pressure (!) 113/49, pulse (!) 59, temperature 98.3 F (36.8 C), temperature source Oral, resp. rate 19, height 5\' 10"  (1.778 m), weight 69.9 kg (154 lb), SpO2 98 %.  Intake/Output Summary (Last 24 hours) at 09/22/16 1505 Last data filed at 09/22/16 0504  Gross per 24 hour  Intake              250 ml  Output              425 ml  Net             -175 ml   Filed Weights   09/18/2016 2208 09/20/16 0826  Weight: 73.9 kg (163 lb) 69.9 kg (154 lb)    Examination: General: on [place] at time of my visit - in no resp distress - much more alert but confused  Lungs: poor air movement in B bases w/o wheeze  Cardiovascular: no appreciable M  Abdomen: Nondistended, soft, bowel sounds hypoactive, no rebound, no ascites Extremities: no edema B LE   CBC:  Recent Labs Lab 09/06/2016 1722 09/07/2016 1748 09/20/16 0205 09/21/16 0340 09/22/16 0353  WBC 12.9*  --  13.9* 9.5 12.7*  NEUTROABS 7.5  --  11.0*  --   --   HGB 7.9* 9.2* 7.5* 7.0* 7.0*  HCT 28.7* 27.0* 27.0* 24.1* 24.8*  MCV 87.5  --  87.7 88.6 88.6  PLT 316  --  315 217 254   Basic Metabolic Panel:  Recent Labs Lab 08/22/2016 1722 09/03/2016 1748 09/20/16 0205 09/21/16 0340  NA 139 138 142 142  K 4.0 4.2 4.0 3.9  CL 97* 96* 98* 99*  CO2 34*  --  34* 33*  GLUCOSE 112* 111* 117* 96  BUN 25* 33* 29* 36*  CREATININE 1.29* 1.50* 1.47* 1.20  CALCIUM 9.2  --  9.1 9.0   GFR: Estimated Creatinine Clearance: 49.4 mL/min (by C-G formula based on SCr of 1.2 mg/dL).  Liver Function Tests:  Recent Labs Lab 09/03/2016 1722 09/20/16 0205 09/21/16 0340  AST 50* 41 41  ALT 59 54 33  ALKPHOS 220* 202* 164*  BILITOT 0.7 0.9 0.8  PROT 6.7 6.1* 5.6*  ALBUMIN 2.2* 2.3* 1.9*     Coagulation Profile:  Recent Labs Lab 09/14/2016 1951 09/20/16 0205  INR 2.21 2.23    HbA1C: Hgb A1c MFr Bld  Date/Time Value Ref Range Status  09/08/2016 07:56 PM 5.4 4.8 - 5.6 % Final    Comment:    (NOTE)         Pre-diabetes: 5.7 - 6.4         Diabetes: >6.4         Glycemic control for adults with diabetes: <7.0   07/06/2016  04:33 AM 7.4 (H) 4.8 - 5.6 % Final    Comment:    (NOTE)         Pre-diabetes: 5.7 - 6.4         Diabetes: >6.4         Glycemic control for adults with diabetes: <7.0     CBG:  Recent Labs Lab 09/21/16 2016 09/21/16 2333 09/22/16 0357 09/22/16 0740 09/22/16 1232  GLUCAP 101* 133* 105* 107* 153*    Recent Results (from the past 240 hour(s))  Body fluid culture     Status: None (Preliminary result)   Collection Time: 09/20/16  9:46 AM  Result Value Ref Range Status   Specimen Description PLEURAL RIGHT  Final   Special Requests NONE  Final   Gram Stain   Final    RARE WBC PRESENT, PREDOMINANTLY MONONUCLEAR NO ORGANISMS SEEN    Culture NO GROWTH 2 DAYS  Final   Report Status PENDING  Incomplete     Scheduled Meds: . ampicillin-sulbactam (UNASYN) IV  3 g Intravenous Q6H  . chlorhexidine  15 mL Mouth Rinse BID  . collagenase   Topical Daily  . insulin aspart  0-9 Units Subcutaneous Q4H  . mouth rinse  15 mL Mouth Rinse q12n4p  . nystatin   Topical BID  . pantoprazole (PROTONIX) IV  40 mg Intravenous Q12H    LOS: 2 days   Cherene Altes, MD [facility] Office  [phone] Pager - Text Page per Shea Evans as per below:  On-Call/Text Page:      [email or web address]      password [email or web address]  If 7PM-7AM, please contact night-coverage [email or web address] Password [email or web address] 09/22/2016, 3:05 PM

## 2016-09-22 NOTE — Progress Notes (Signed)
Contacted 6E x3 to give hand off report on patient. Provided report to [facility]

## 2016-09-23 ENCOUNTER — Inpatient Hospital Stay (HOSPITAL_COMMUNITY): Payer: Medicare Other

## 2016-09-23 LAB — MAGNESIUM
MAGNESIUM: 1.1 mg/dL — AB (ref 1.7–2.4)
Magnesium: 2.2 mg/dL (ref 1.7–2.4)

## 2016-09-23 LAB — BLOOD GAS, ARTERIAL
ACID-BASE EXCESS: 10.3 mmol/L — AB (ref 0.0–2.0)
Bicarbonate: 35 mmol/L — ABNORMAL HIGH (ref 20.0–28.0)
DRAWN BY: 505221
O2 Content: 4 L/min
O2 SAT: 99.8 %
PCO2 ART: 52.5 mmHg — AB (ref 32.0–48.0)
PH ART: 7.438 (ref 7.350–7.450)
Patient temperature: 98.6
pO2, Arterial: 134 mmHg — ABNORMAL HIGH (ref 83.0–108.0)

## 2016-09-23 LAB — BASIC METABOLIC PANEL
ANION GAP: 9 (ref 5–15)
ANION GAP: 9 (ref 5–15)
Anion gap: 14 (ref 5–15)
BUN: 53 mg/dL — AB (ref 6–20)
BUN: 53 mg/dL — ABNORMAL HIGH (ref 6–20)
BUN: 53 mg/dL — ABNORMAL HIGH (ref 6–20)
CHLORIDE: 101 mmol/L (ref 101–111)
CHLORIDE: 101 mmol/L (ref 101–111)
CO2: 33 mmol/L — AB (ref 22–32)
CO2: 30 mmol/L (ref 22–32)
CO2: 33 mmol/L — AB (ref 22–32)
Calcium: 9.3 mg/dL (ref 8.9–10.3)
Calcium: 5.4 mg/dL — CL (ref 8.9–10.3)
Calcium: 9 mg/dL (ref 8.9–10.3)
Chloride: 103 mmol/L (ref 101–111)
Creatinine, Ser: 1.57 mg/dL — ABNORMAL HIGH (ref 0.61–1.24)
Creatinine, Ser: 1.62 mg/dL — ABNORMAL HIGH (ref 0.61–1.24)
Creatinine, Ser: 1.61 mg/dL — ABNORMAL HIGH (ref 0.61–1.24)
GFR calc Af Amer: 47 mL/min — ABNORMAL LOW (ref >60)
GFR calc Af Amer: 45 mL/min — ABNORMAL LOW (ref >60)
GFR calc Af Amer: 45 mL/min — ABNORMAL LOW (ref >60)
GFR calc non Af Amer: 40 mL/min — ABNORMAL LOW (ref >60)
GFR calc non Af Amer: 39 mL/min — ABNORMAL LOW (ref >60)
GFR calc non Af Amer: 39 mL/min — ABNORMAL LOW (ref >60)
GLUCOSE: 140 mg/dL — AB (ref 65–99)
GLUCOSE: 165 mg/dL — AB (ref 65–99)
GLUCOSE: 188 mg/dL — AB (ref 65–99)
POTASSIUM: 5.8 mmol/L — AB (ref 3.5–5.1)
Potassium: 3.7 mmol/L (ref 3.5–5.1)
Potassium: 3.6 mmol/L (ref 3.5–5.1)
SODIUM: 145 mmol/L (ref 135–145)
Sodium: 145 mmol/L (ref 135–145)
Sodium: 143 mmol/L (ref 135–145)

## 2016-09-23 LAB — CBC
HEMATOCRIT: 24.3 % — AB (ref 39.0–52.0)
HEMOGLOBIN: 6.8 g/dL — AB (ref 13.0–17.0)
MCH: 24.5 pg — AB (ref 26.0–34.0)
MCHC: 28 g/dL — ABNORMAL LOW (ref 30.0–36.0)
MCV: 87.7 fL (ref 78.0–100.0)
PLATELETS: 184 10*3/uL (ref 150–400)
RBC: 2.77 MIL/uL — ABNORMAL LOW (ref 4.22–5.81)
RDW: 19.2 % — ABNORMAL HIGH (ref 11.5–15.5)
WBC: 10 10*3/uL (ref 4.0–10.5)

## 2016-09-23 LAB — BODY FLUID CULTURE: CULTURE: NO GROWTH

## 2016-09-23 LAB — GLUCOSE, CAPILLARY
GLUCOSE-CAPILLARY: 180 mg/dL — AB (ref 65–99)
GLUCOSE-CAPILLARY: 140 mg/dL — AB (ref 65–99)
GLUCOSE-CAPILLARY: 186 mg/dL — AB (ref 65–99)
Glucose-Capillary: 182 mg/dL — ABNORMAL HIGH (ref 65–99)
Glucose-Capillary: 167 mg/dL — ABNORMAL HIGH (ref 65–99)

## 2016-09-23 LAB — HEMOGLOBIN AND HEMATOCRIT, BLOOD
HCT: 32.6 % — ABNORMAL LOW (ref 39.0–52.0)
HEMOGLOBIN: 10.4 g/dL — AB (ref 13.0–17.0)

## 2016-09-23 LAB — PREPARE RBC (CROSSMATCH)

## 2016-09-23 LAB — PHOSPHORUS: Phosphorus: 3.5 mg/dL (ref 2.5–4.6)

## 2016-09-23 LAB — MRSA PCR SCREENING: MRSA BY PCR: NEGATIVE

## 2016-09-23 MED ORDER — RESOURCE THICKENUP CLEAR PO POWD
Freq: Two times a day (BID) | ORAL | Status: DC
  Administered 2016-09-23 – 2016-09-29 (×8): via ORAL
  Filled 2016-09-23 (×2): qty 125

## 2016-09-23 MED ORDER — ADULT MULTIVITAMIN W/MINERALS CH
1.0000 | ORAL_TABLET | Freq: Every day | ORAL | Status: DC
  Administered 2016-09-24 – 2016-09-28 (×5): 1 via ORAL
  Filled 2016-09-23 (×5): qty 1

## 2016-09-23 MED ORDER — SODIUM CHLORIDE 0.9 % IV SOLN
Freq: Once | INTRAVENOUS | Status: AC
  Administered 2016-09-24: 08:00:00 via INTRAVENOUS

## 2016-09-23 MED ORDER — IOPAMIDOL (ISOVUE-370) INJECTION 76%
INTRAVENOUS | Status: AC
  Administered 2016-09-23: 80 mL
  Filled 2016-09-23: qty 100

## 2016-09-23 NOTE — Code Documentation (Signed)
  Patient Name: [name].   MRN: [id number]   Date of Birth/ Sex: Jan 11, 1935 , male      Admission Date: 09/03/2016  Attending Provider: Shon Millet*  Primary Diagnosis: Overdose, accidental or unintentional, initial encounter   Indication: Pt was in his usual state of health until this AM, when he was noted to be transiently unresponsive. Code blue was subsequently called. Patient maintained pulse and ACLS was not initiated.    Technical Description:  - CPR performance duration:  0 minutes  - Was defibrillation or cardioversion used? No   - Was external pacer placed? No  - Was patient intubated pre/post CPR? No   Medications Administered: Y = Yes; Blank = No Amiodarone    Atropine    Calcium    Epinephrine    Lidocaine    Magnesium    Norepinephrine    Phenylephrine    Sodium bicarbonate    Vasopressin      Miscellaneous Information:  - Labs sent, including: ABG  - Primary team notified?  Yes  - Family Notified? Yes  - Additional notes/ transfer status: None      Velna Ochs, MD  09/23/2016, 11:40 AM

## 2016-09-23 NOTE — Progress Notes (Signed)
Responded to Code blue; patient breathing on non-rebreather and talking to staff.  Physician at bedside declines intubation.  Spo2 96%. Requested repeat call if needing additional airway support.

## 2016-09-23 NOTE — Progress Notes (Signed)
Initial Nutrition Assessment  DOCUMENTATION CODES:   Severe malnutrition in context of acute illness/injury  INTERVENTION:   -MVI daily -Magic Cup TID with meals  NUTRITION DIAGNOSIS:   Malnutrition (severe) related to acute illness (dysphagia) as evidenced by percent weight loss, moderate depletion of body fat, severe depletion of body fat, moderate depletions of muscle mass, severe depletion of muscle mass.  Ongoing  GOAL:   Patient will meet greater than or equal to 90% of their needs  Progressing  MONITOR:   PO intake, Supplement acceptance, Diet advancement, Labs, Weight trends, Skin, I & O's  REASON FOR ASSESSMENT:   Malnutrition Screening Tool    ASSESSMENT:   82 y.o. Male with medical history significant for COPD with chronic respiratory failure, chronic diastolic CHF, CAD status post CABG, chronic atrial fibrillation on Coumadin, intermittent agitation, and chronic wounds involving the left foot and sacrum who presents to the emergency department from his SNF for evaluation of somnolence following an accident abnormal overdose of Ativan, Haldol, and Benadryl.  Pt transferred to SDU due to rapid response.   Pt unable to provide much hx related to cognitive deficit. Family friend ([name]) at bedside, provided most of the hx. She reports that pt typically has a very good appetite. She shares that pt has had poor po intake since going to SNF related to dislike of the food. Pt does consume outside food [name] and [name]) brought in by friends and family members. Favorite food include fruit and ice cream. Ensure has been trialed in the past, however, pt does not like it.   Pt has experienced "a lot" of weight loss over the past 2-3 months secondary to poor oral intake. Friend also reveals that pt has experienced swallowing difficulties over the past 3-4 weeks, but unsure of any interventions to address this PTA. Per wt hx, pt has experienced a 43# (21.8%) wt loss  over the past 3 months, which is significant for time frame.   Nutrition-Focused physical exam completed. Findings are moderate to severe fat depletion, moderate to severe muscle depletion, and no edema.   Labs reviewed: CBGS: 140-180.   Diet Order:  Diet full liquid Room service appropriate? Yes; Fluid consistency: Nectar Thick  Skin:  Wound (see comment) (fissure guteal fold, unstageable rt heel)  Last BM:  09/23/16  Height:   Ht Readings from Last 1 Encounters:  09/20/16 5\' 10"  (1.778 m)    Weight:   Wt Readings from Last 1 Encounters:  09/22/16 154 lb (69.9 kg)    Ideal Body Weight:  75.4 kg  BMI:  Body mass index is 22.1 kg/m.  Estimated Nutritional Needs:   Kcal:  2500-3704  Protein:  90-100 gm  Fluid:  1.7-1.9 L  EDUCATION NEEDS:   Education needs addressed  [name], RD, LDN, CDE Pager: [phone] After hours Pager: [phone]

## 2016-09-23 NOTE — Progress Notes (Signed)
   09/23/16 1130  Clinical Encounter Type  Visited With Patient  Visit Type Code  Spiritual Encounters  Spiritual Needs Emotional  Stress Factors  Patient Stress Factors Health changes  Responded to Code Blue. Pt. Stabilized.

## 2016-09-23 NOTE — Progress Notes (Signed)
CRITICAL VALUE ALERT  Critical value received:  Calcium 5.4  Date of notification:  09/23/2016  Time of notification:  2017  Critical value read back:Yes.   yes  Nurse who received alert:  Harlow Asa, BSN, RN  MD notified (1st page):  Dr. Baltazar Najjar  Time of first page: 2035  MD notified (2nd page):  Time of second page:  Responding MD:  Dr. Baltazar Najjar  Time MD responded:  [phone]

## 2016-09-23 NOTE — Progress Notes (Signed)
  Speech Language Pathology Treatment:    Patient Details Name: [name]. MRN: [id number] DOB: Jul 26, 1934 Today's Date: 09/23/2016 Time: 0962-8366 SLP Time Calculation (min) (ACUTE ONLY): 20 min  Assessment / Plan / Recommendation Clinical Impression  Pt presents with improved participation today with this SLP.  Provided pt with peaches, pudding, nectar, ice and water.  NO Indication of airway compromise across all po = despite pt consuming large sequential boluses.  Pt with timely mastication and no oral residuals.  Pt does present with belching but denies this occurring frequently and denies refluxing.  Recommend advancing diet to dys3/ground meat/thin with strict precautions.  At this time, would recommend defer MBS unless pt develops increasing difficulties.  Educated pt to care plan, but his cognitive disease likely presents him from adequate comprehension.  Posted sign above bed for pt.  Will follow up next date for po tolerance/indication for instrumental evaluation.    HPI HPI: 82 y.o.malewith history of COPD, chronic diastolic CHF, CAD status post CABG, chronic atrial fibrillation on Coumadin, intermittent agitation, and chronic wounds involving the left foot and sacrum who presentedto the EDfrom his SNF for evaluation of somnolence following an accidental overdose of Ativan, Haldol, and Benadryl. Patient was noted to be increasingly somnolent at the nursing facility and EMS was called for transport to the hospital. Per the report of his family at the bedside, he had begun to show good improvement at the nursing facility after being discharged from the hospital on 08/09/2016 after nonoperative management of right humerus fracture, treatment of bleeding ulcer with endoscopic clipping and 4 units of packed red blood cells, and drainage of a right pleural effusion.Per MD equires 2LO2 at baseline - acute decline likely due to respiratory suppression w/ OD of 2 sedating meds - wean O2 as  able but presently still requiring FM oxygen, w/ rapid severe desaturation when he pulls off his mask - cont empiric abx for possible aspiration. Seen during prior admission 08/01/16, with findings of functional swallow with sufficient oral attention, adequate mastication. Regular solids, thin liquids, meds whole in puree recommended. CXR 09/21/16 Pleural fluid on the right is similar in amount. Presumed small left pleural effusion is stable, Persistent lung opacities are noted. Appearance suggests a combination of pulmonary edema and basilar atelectasis. Pneumonia is possible. Pt has been npo x ice chips due to mental status and aspiration concerns.        SLP Plan  Continue with current plan of care       Recommendations  Diet recommendations: Dysphagia 3 (mechanical soft);Thin liquid Liquids provided via: Straw Medication Administration: Whole meds with puree Supervision: Full supervision/cueing for compensatory strategies;Staff to assist with self feeding Compensations: Minimize environmental distractions;Slow rate Postural Changes and/or Swallow Maneuvers: Seated upright 90 degrees;Upright 30-60 min after meal                Oral Care Recommendations: Oral care QID Follow up Recommendations: Other (comment) (tba) SLP Visit Diagnosis: Dysphagia, unspecified (R13.10) Plan: Continue with current plan of care       [place], [facility] SLP [phone]

## 2016-09-23 NOTE — Consult Note (Signed)
   [facility] CM Inpatient Consult   09/23/2016  Traveion Ruddock 08/25/1934 [id number]   Patient assessed for 3rd admission in 6 months in the [facility] ACO registry.  Patient was admitted from [facility]. Chart review and the patient is a 82 y.o.malewith history of COPD, chronic diastolic CHF, CAD status post CABG, chronic atrial fibrillation on Coumadin, intermittent agitation, and chronic wounds involving the left foot and sacrum who presented to the ED from his SNF for evaluation of somnolence following an accidental overdose of Ativan, Haldol, and Benadryl. Patient reportedly is treated at his nursing facility with a compounded topical cream containing 1 mg Ativan, 1 mg Haldol, and 25 mg Benadryl per dose. He typically receives this twice daily as needed for agitation, but was inadvertently given a 10xdose by a new nurse on day of admission, and it was administered orally rather than topically. endoscopic clipping and 4 units of packed red blood cells, and drainage of a right pleural effusion per MD notes.  Spoke with inpatient RNCM and CSW and patient has had an event and likely to transfer back to stepdown. Will continue to follow for disposition needs as appropriate.  For questions, please contact:  Natividad Brood, RN BSN [facility] Liaison  [phone] business mobile phone Toll free office [phone]

## 2016-09-23 NOTE — Progress Notes (Signed)
Gluteal fold;fissure: 4cm x 2cm x 0.1cm; pink, moist, clean Right heel: unstageable pressure injury: 4cm x 3cm x 0cm; 100% black stable eschar Left great toe amputation site: 4cm x 1.5cm x 0.5cm; 95% pink, moist, early granulation with 5% yellow at the proximal wound edge Dorsal proximal medial right foot: 2cm x 2cm, 0.3cm connects with distal amputation site wound, intact skin bridge between: 100% yellow Right 2nd toe: scabbed 1cm x 0.5cm x 0cm Right malleolus: 0.5cm x 0.5cm x 0; 100% hard/yellow    Received from 4 east,  Tele placed mittens on bilaterally, soft restraints removed. Patient cooperative and appears comfortable. Will reassess in am for restraints. Bed alarm placed

## 2016-09-23 NOTE — Progress Notes (Addendum)
PROGRESS NOTE    [name].  IEP:[id number] DOB: 1934/12/09 DOA: 09/04/2016 PCP: [name], MD     Brief Narrative:  [name]. 81 y.o.malewith history of COPD, chronic diastolic CHF, CAD status post CABG, chronic atrial fibrillation on Coumadin, intermittent agitation, and chronic wounds involving the left foot and sacrum who presented to the ED from his SNF for evaluation of somnolence following an accidental overdose of Ativan, Haldol, and Benadryl. Patient reportedly is treated at his nursing facility with a compounded topical cream containing 1 mg Ativan, 1 mg Haldol, and 25 mg Benadryl per dose. He typically receives this twice daily as needed for agitation, but was inadvertently given a 10xdose by a new nurse, and it was administered orally rather than topically. Patient was noted to be increasingly somnolent at the nursing facility and EMS was called for transport to the hospital. He was admitted due to acute encephalopathy and accidental drug overdose.   Subjective: Patient evaluated twice today.   This morning, he was resting in bed comfortably. He had no complaints of chest pain or shortness of breath, no nausea, vomiting. He seemed agitated with my questions and stated "stop quizzing me"   RN called later in the morning after code blue. RN was in patient room setting up blood transfusion (not yet started) when patient became pale then unresponsive. Code blue was called. Patient had not lost pulses and ACLS not initiated. Patient was bag masked twice when he woke up and yelled "stop." Blood sugar was 80, BP 119/61, SpO2 96% and maintaining airway. Telemetry reviewed; no asystole. He is able to answer all questions appropriately, follows all commands without focal deficits. Transferred to stepdown.    Assessment & Plan:   Principal Problem:   Overdose, accidental or unintentional, initial encounter Active Problems:   DM (diabetes mellitus) type II uncontrolled,  periph vascular disorder ([name])   Obstructive sleep apnea   Essential hypertension   CAD (coronary artery disease)   Chronic atrial fibrillation ([name])   Pleural effusion, left   Anemia   Chronic respiratory failure with hypoxia ([name])   AKI (acute kidney injury) ([place])   Accidental overdose   Acute on chronic respiratory failure with hypoxia ([name])   Pressure injury of skin  Acute encephalopathy due to accidental overdose -Typically takes topical compound of 1 mg Ativan, 1 mg Haldol, 25 mg Benadryl at skilled nursing facility, however, he inadvertently received 10 mg Ativan, 10 mg, Haldol and 250 mg of Benadryl orally. -Mentation has been slowly improving, although still requires restraints due to agitation. Holding home meds.   Syncopal episode -See subjective above. Patient did not lose pulses. Unclear etiology, less likely seizure as he had no post-ictal change, no focal deficits. Possibly related to respiratory failure, aspiration.  -Check CTA chest r/o PE -negative for PE  -Monitor, transfer to step down  Acute on chronic hypoxic respiratory failure  -Requires 2L [place] at baseline, acute decline likely related to respiratory suppression w/ OD of sedating meds as well as possible aspiration  -5 day course antibiotics Unasyn   Right pleural effusion -S/p IR thoracentesis 3/31, culture negative. No protein or LDH available.  -CTA chest with large right pleural effusion and small/mod left pleural effusion. Ordered IR thoracentesis on right   Chronic diastolic heart failure -TTE (07/31/16) with EF 55-60%, moderate concentric LVH, no WMA, not able to determine diastolic function, mild-mod MR, bioprosthetic AV okay, moderate TR, mod-severe LAE, severely elevated PA pressures -Holding lasix for  now due to poor/no oral intake  -I/Os, daily weight. Dry weight around 79kg   AKI on CKD stage 3 -Stable, monitor BMP   Chronic a fib -CHADS-VASc at least 4  -Tx with warfarin - holding for now due  to high risk for fall/injury and bleeding  -Lopressor   Nutrition  -SLP and dietician following  COPD, OSA -Stable currently  Chronic blood loss anemia -Baseline Hgb 8-9 -S/p 4 units pRBCs for upper GIB in February 2018; had been cleared by GI to resume warfarin with BID Protonix; Hgb was 8.0 when discharged  -Ordered 2u pRBC this morning  -Trend CBC  DM type 2 -A1c 7.4 -SSI  Multiple chronic wounds -Wound RN consulted    DVT prophylaxis: SCDs  Code Status: Full Family Communication: spoke with wife over the phone Disposition Plan: pending stabilization, back to SNF   Consultants:   None  Procedures:   IR thora on right 3/31  Antimicrobials:  Anti-infectives    Start     Dose/Rate Route Frequency Ordered Stop   09/13/2016 2300  Ampicillin-Sulbactam (UNASYN) 3 g in sodium chloride 0.9 % 100 mL IVPB     3 g 200 mL/hr over 30 Minutes Intravenous Every 6 hours 09/12/2016 2212 09/24/16 2259   08/23/2016 2000  ampicillin (OMNIPEN) 1 g in sodium chloride 0.9 % 50 mL IVPB  Status:  Discontinued     1 g 150 mL/hr over 20 Minutes Intravenous Every 6 hours 09/03/2016 1954 09/09/2016 2212         Objective: Vitals:   09/22/16 2249 09/23/16 0538 09/23/16 0900 09/23/16 1140  BP: (!) 82/42 (!) 83/55 (!) 110/43 119/61  Pulse: (!) 59 (!) 59 61   Resp: 20 20 20    Temp: 98.3 F (36.8 C) 97.8 F (36.6 C) 97.7 F (36.5 C)   TempSrc: Axillary Axillary Axillary   SpO2: 97% (!) 82% 97%   Weight: 69.9 kg (154 lb)     Height:        Intake/Output Summary (Last 24 hours) at 09/23/16 1220 Last data filed at 09/23/16 0636  Gross per 24 hour  Intake              118 ml  Output              800 ml  Net             -682 ml   Filed Weights   09/13/2016 2208 09/20/16 0826 09/22/16 2249  Weight: 73.9 kg (163 lb) 69.9 kg (154 lb) 69.9 kg (154 lb)    Examination:  General exam: Appears calm and comfortable  Respiratory system: Clear to auscultation anteriorly. Respiratory effort  normal on [place] O2  Cardiovascular system: S1 & S2 heard, irregular, rate 60s. No JVD, murmurs, rubs, gallops or clicks. No pedal edema. Gastrointestinal system: Abdomen is nondistended, soft and nontender. No organomegaly or masses felt. Normal bowel sounds heard. Central nervous system: Alert, nonfocal  Extremities: Symmetric in appearance  Skin: multiple areas of bruising as well as wounds that are covered   Data Reviewed: I have personally reviewed following labs and imaging studies  CBC:  Recent Labs Lab 09/03/2016 1722 09/02/2016 1748 09/20/16 0205 09/21/16 0340 09/22/16 0353 09/23/16 0504  WBC 12.9*  --  13.9* 9.5 12.7* 10.0  NEUTROABS 7.5  --  11.0*  --   --   --   HGB 7.9* 9.2* 7.5* 7.0* 7.0* 6.8*  HCT 28.7* 27.0* 27.0* 24.1* 24.8* 24.3*  MCV 87.5  --  87.7 88.6 88.6 87.7  PLT 316  --  315 217 237 470   Basic Metabolic Panel:  Recent Labs Lab 09/09/2016 1722 09/09/2016 1748 09/20/16 0205 09/21/16 0340 09/23/16 0504  NA 139 138 142 142 145  K 4.0 4.2 4.0 3.9 3.7  CL 97* 96* 98* 99* 103  CO2 34*  --  34* 33* 33*  GLUCOSE 112* 111* 117* 96 140*  BUN 25* 33* 29* 36* 53*  CREATININE 1.29* 1.50* 1.47* 1.20 1.57*  CALCIUM 9.2  --  9.1 9.0 9.3   GFR: Estimated Creatinine Clearance: 37.7 mL/min (A) (by C-G formula based on SCr of 1.57 mg/dL (H)). Liver Function Tests:  Recent Labs Lab 08/26/2016 1722 09/20/16 0205 09/21/16 0340  AST 50* 41 41  ALT 59 54 33  ALKPHOS 220* 202* 164*  BILITOT 0.7 0.9 0.8  PROT 6.7 6.1* 5.6*  ALBUMIN 2.2* 2.3* 1.9*   No results for input(s): LIPASE, AMYLASE in the last 168 hours. No results for input(s): AMMONIA in the last 168 hours. Coagulation Profile:  Recent Labs Lab 08/30/2016 1951 09/20/16 0205  INR 2.21 2.23   Cardiac Enzymes: No results for input(s): CKTOTAL, CKMB, CKMBINDEX, TROPONINI in the last 168 hours. BNP (last 3 results) No results for input(s): PROBNP in the last 8760 hours. HbA1C: No results for input(s):  HGBA1C in the last 72 hours. CBG:  Recent Labs Lab 09/22/16 1232 09/22/16 1754 09/22/16 2156 09/23/16 1010 09/23/16 1131  GLUCAP 153* 114* 145* 182* 180*   Lipid Profile: No results for input(s): CHOL, HDL, LDLCALC, TRIG, CHOLHDL, LDLDIRECT in the last 72 hours. Thyroid Function Tests: No results for input(s): TSH, T4TOTAL, FREET4, T3FREE, THYROIDAB in the last 72 hours. Anemia Panel: No results for input(s): VITAMINB12, FOLATE, FERRITIN, TIBC, IRON, RETICCTPCT in the last 72 hours. Sepsis Labs:  Recent Labs Lab 09/03/2016 1744  LATICACIDVEN 1.64    Recent Results (from the past 240 hour(s))  Body fluid culture     Status: None   Collection Time: 09/20/16  9:46 AM  Result Value Ref Range Status   Specimen Description PLEURAL RIGHT  Final   Special Requests NONE  Final   Gram Stain   Final    RARE WBC PRESENT, PREDOMINANTLY MONONUCLEAR NO ORGANISMS SEEN    Culture NO GROWTH 3 DAYS  Final   Report Status 09/23/2016 FINAL  Final       Radiology Studies: No results found.    Scheduled Meds: . sodium chloride   Intravenous Once  . ampicillin-sulbactam (UNASYN) IV  3 g Intravenous Q6H  . chlorhexidine  15 mL Mouth Rinse BID  . collagenase   Topical Daily  . donepezil  10 mg Oral QHS  . insulin aspart  0-5 Units Subcutaneous QHS  . insulin aspart  0-9 Units Subcutaneous TID WC  . mouth rinse  15 mL Mouth Rinse q12n4p  . metoprolol tartrate  25 mg Oral BID  . nystatin   Topical BID  . pantoprazole  40 mg Oral BID  . QUEtiapine  50 mg Oral QHS  . RESOURCE THICKENUP CLEAR   Oral BID AC & HS   Continuous Infusions:   LOS: 3 days    Time spent: 50 minutes   Dessa Phi, DO [facility] [email or web address] Password [email or web address] 09/23/2016, 12:20 PM

## 2016-09-23 NOTE — Progress Notes (Signed)
Tried to contact patient's spouse on her cell phone,keep on ringing ,left a voicemail messages to call us back here.Patient's spouse house number,somebody answered the call,told this caller that Vaughan Basta is on her medical appointment and the said person is busy and just hung-up the phone on this caller.

## 2016-09-23 NOTE — Progress Notes (Signed)
Received pt. From [facility] with soft restraint off. Pt is calm and cooperative.

## 2016-09-23 NOTE — Progress Notes (Signed)
While in the patient's room,while trying to set-up blood transfusion set,patient rolled up his eyes,suddenly very pale looking ,not responding on verbal stimuli,shook him vigorously,no immediate respond and no breathing and he has a pulse. Code activated,,nurse grabbed him and vigorously shook again,was trying to elevate his head ,at this time patient responded.Nurse placed his head back to bed but the patient rolled up his eyes and looked very paled looking and not responding,patient has still a pulse ,code activated again.Suction rendered to him but nothing was suctioned from the patient mouth.Ambo-bag patient and at this time patient become very responsive.No CPR done.

## 2016-09-23 NOTE — Progress Notes (Signed)
Transferred-in from [facility] by bed, awake and alert. Blood transfusion in progress to right forearm

## 2016-09-23 NOTE — Progress Notes (Signed)
RT responded to code blue in 6E27. Pt not breathing upon entering room. RT began to manually ventilate pt and after approximately 5 breaths, Pt began saying "get this thing off of me". Pt placed on NRB at that time, ultimately then transitioned to 4L [place] with spo2 99%. Code team at bedside, MD requested ABG. Pt VS within normal limits. RT will continue to monitor.

## 2016-09-23 NOTE — NC FL2 (Signed)
[place] LEVEL OF CARE SCREENING TOOL     IDENTIFICATION  Patient Name: [name]. Birthdate: 07/26/1934 Sex: male Admission Date (Current Location): 09/18/2016  [facility] and [place] Number:  Herbalist and Address:  The [place], [place] [address] [place], [place] [address]      Provider Number: [id number]  Attending Physician Name and Address:  Shon Millet*  Relative Name and Phone Number:       Current Level of Care: Hospital Recommended Level of Care: [place] Prior Approval Number:    Date Approved/Denied:   PASRR Number:    Discharge Plan: SNF    Current Diagnoses: Patient Active Problem List   Diagnosis Date Noted  . Pressure injury of skin 09/20/2016  . AKI (acute kidney injury) ([place]) 09/01/2016  . Overdose, accidental or unintentional, initial encounter 09/05/2016  . Accidental overdose 09/08/2016  . Acute on chronic respiratory failure with hypoxia ([place])   . Acute gastric ulcer with hemorrhage   . PVC (premature ventricular contraction)   . NSVT (nonsustained ventricular tachycardia) ([place])   . Right humeral fracture 07/25/2016  . Supratherapeutic INR 07/25/2016  . Chronic respiratory failure with hypoxia ([place]) 07/25/2016  . Fall 07/25/2016  . Alzheimer's dementia 07/25/2016  . Fracture of humerus neck, right, closed, initial encounter   . Anemia 07/24/2016  . Wound abscess   . Congestive heart failure ([place])   . S/P thoracentesis   . Malnutrition of moderate degree 07/04/2016  . Pleural effusion, left 07/04/2016  . Diabetic foot ulcer with osteomyelitis ([place]) 07/03/2016  . Hypoxia 07/03/2016  . Acute diastolic CHF (congestive heart failure) ([place]) 07/03/2016  . Abscess of abdominal wall 06/02/2014  . Abdominal wall abscess 06/02/2014  . Hyponatremia 06/02/2014  . Cellulitis 12/23/2013  . Aftercare following surgery of the circulatory system, [place] 10/05/2013  .  Encounter for therapeutic drug monitoring 07/18/2013  . Tingling 11/01/2012  . Hypoxemia 08/04/2012  . Fatigue 01/20/2012  . Memory loss 01/20/2012  . Chronic diastolic heart failure ([place]) 09/01/2011  . Chronic atrial fibrillation ([place]) 08/21/2011  . Paresthesia 12/16/2010  . Medicare annual wellness visit, initial 10/10/2010  . Carotid artery disease ([place]) 09/27/2010  . Long term (current) use of anticoagulants 09/26/2010  . THYROID NODULE 01/26/2010  . PVD (peripheral vascular disease) ([place]) 01/02/2009  . HIP PAIN, BILATERAL 01/02/2009  . HLD (hyperlipidemia) 08/16/2008  . MYOCARDIAL INFARCTION, HX OF 08/16/2008  . S/P AVR (aortic valve replacement) 08/16/2008  . PERIPHERAL VASCULAR DISEASE WITH CLAUDICATION 08/16/2008  . ANEMIA, HX OF 08/16/2008  . AORTIC VALVE REPLACEMENT, HX OF 08/16/2008  . PACEMAKER, PERMANENT 08/16/2008  . ERECTILE DYSFUNCTION 11/26/2006  . DM (diabetes mellitus) type II uncontrolled, periph vascular disorder ([place]) 07/28/2006  . OBESITY, MORBID 07/28/2006  . Obstructive sleep apnea 07/28/2006  . Essential hypertension 07/28/2006  . CAD (coronary artery disease) 07/28/2006  . RENAL ARTERY STENOSIS 07/28/2006  . INSUFFICIENCY, VASCULAR NOS, INTESTINE 07/28/2006  . NEPHROLITHIASIS, HX OF 07/28/2006    Orientation RESPIRATION BLADDER Height & Weight     Self, Place  Normal (See DC Summary) External catheter Weight: 154 lb (69.9 kg) Height:  5\' 10"  (177.8 cm)  BEHAVIORAL SYMPTOMS/MOOD NEUROLOGICAL BOWEL NUTRITION STATUS      Continent Diet (See DC Summary)  AMBULATORY STATUS COMMUNICATION OF NEEDS Skin   Total Care Verbally Surgical wounds, PU Stage and Appropriate Care (Unstageable PU right heel and right ankle with foam dressing; left toe amputation foam  dressing, change every 3 days)                       Personal Care Assistance Level of Assistance  Bathing, Feeding, Dressing, Total care Bathing Assistance: Maximum assistance Feeding  assistance: Maximum assistance Dressing Assistance: Maximum assistance Total Care Assistance: Maximum assistance   Functional Limitations Info             SPECIAL CARE FACTORS FREQUENCY  Speech therapy             Speech Therapy Frequency: 2x's a week      Contractures      Additional Factors Info  Allergies, Code Status, Psychotropic, Insulin Sliding Scale Code Status Info: FULL Allergies Info: POTASSIUM-CONTAINING COMPOUNDS, METOPROLOL SUCCINATE  Psychotropic Info: Seroquel 50 mg Insulin Sliding Scale Info: 5 units at bedtime; 9 units 3x's day       Current Medications (09/23/2016):  This is the current hospital active medication list Current Facility-Administered Medications  Medication Dose Route Frequency Provider Last Rate Last Dose  . 0.9 %  sodium chloride infusion   Intravenous Once [employer], DO      . acetaminophen (TYLENOL) suppository 650 mg  650 mg Rectal Q6H PRN Vianne Bulls, MD      . acetaminophen (TYLENOL) tablet 650 mg  650 mg Oral Q6H PRN Cherene Altes, MD      . Ampicillin-Sulbactam (UNASYN) 3 g in sodium chloride 0.9 % 100 mL IVPB  3 g Intravenous Q6H Cherene Altes, MD   3 g at 09/23/16 2353  . bisacodyl (DULCOLAX) suppository 10 mg  10 mg Rectal Daily PRN Cherene Altes, MD      . chlorhexidine (PERIDEX) 0.12 % solution 15 mL  15 mL Mouth Rinse BID Cherene Altes, MD   15 mL at 09/22/16 2154  . collagenase (SANTYL) ointment   Topical Daily Cherene Altes, MD      . donepezil (ARICEPT) tablet 10 mg  10 mg Oral QHS Cherene Altes, MD   10 mg at 09/22/16 2157  . insulin aspart (novoLOG) injection 0-5 Units  0-5 Units Subcutaneous QHS Cherene Altes, MD      . insulin aspart (novoLOG) injection 0-9 Units  0-9 Units Subcutaneous TID WC Cherene Altes, MD   2 Units at 09/22/16 1712  . ipratropium-albuterol (DUONEB) 0.5-2.5 (3) MG/3ML nebulizer solution 3 mL  3 mL Nebulization Q2H PRN Vianne Bulls, MD   3 mL at  09/10/2016 2225  . MEDLINE mouth rinse  15 mL Mouth Rinse q12n4p Cherene Altes, MD      . metoprolol tartrate (LOPRESSOR) tablet 25 mg  25 mg Oral BID Cherene Altes, MD   25 mg at 09/22/16 2154  . nystatin (MYCOSTATIN/NYSTOP) topical powder   Topical BID Ilene Qua Opyd, MD      . ondansetron (ZOFRAN) injection 4 mg  4 mg Intravenous Q6H PRN Vianne Bulls, MD      . pantoprazole (PROTONIX) EC tablet 40 mg  40 mg Oral BID Cherene Altes, MD   40 mg at 09/22/16 2154  . QUEtiapine (SEROQUEL) tablet 50 mg  50 mg Oral QHS Cherene Altes, MD   50 mg at 09/22/16 2154     Discharge Medications: Please see discharge summary for a list of discharge medications.  Relevant Imaging Results:  Relevant Lab Results:   Additional Information [id number]  Normajean Baxter, LCSW

## 2016-09-24 ENCOUNTER — Encounter (HOSPITAL_COMMUNITY): Payer: Medicare Other

## 2016-09-24 ENCOUNTER — Inpatient Hospital Stay (HOSPITAL_COMMUNITY): Payer: Medicare Other

## 2016-09-24 DIAGNOSIS — E43 Unspecified severe protein-calorie malnutrition: Secondary | ICD-10-CM | POA: Insufficient documentation

## 2016-09-24 DIAGNOSIS — E1165 Type 2 diabetes mellitus with hyperglycemia: Secondary | ICD-10-CM

## 2016-09-24 DIAGNOSIS — E1151 Type 2 diabetes mellitus with diabetic peripheral angiopathy without gangrene: Secondary | ICD-10-CM

## 2016-09-24 DIAGNOSIS — J9611 Chronic respiratory failure with hypoxia: Secondary | ICD-10-CM

## 2016-09-24 DIAGNOSIS — R4182 Altered mental status, unspecified: Secondary | ICD-10-CM

## 2016-09-24 DIAGNOSIS — D509 Iron deficiency anemia, unspecified: Secondary | ICD-10-CM

## 2016-09-24 DIAGNOSIS — N179 Acute kidney failure, unspecified: Secondary | ICD-10-CM

## 2016-09-24 LAB — BPAM RBC
BLOOD PRODUCT EXPIRATION DATE: 201804042359
Blood Product Expiration Date: 201804202359
ISSUE DATE / TIME: 201804031204
ISSUE DATE / TIME: 201804031519
UNIT TYPE AND RH: 1700
Unit Type and Rh: 7300

## 2016-09-24 LAB — CBC
HCT: 30.1 % — ABNORMAL LOW (ref 39.0–52.0)
HEMOGLOBIN: 9.2 g/dL — AB (ref 13.0–17.0)
MCH: 26.4 pg (ref 26.0–34.0)
MCHC: 30.6 g/dL (ref 30.0–36.0)
MCV: 86.2 fL (ref 78.0–100.0)
PLATELETS: 184 10*3/uL (ref 150–400)
RBC: 3.49 MIL/uL — AB (ref 4.22–5.81)
RDW: 17.2 % — ABNORMAL HIGH (ref 11.5–15.5)
WBC: 9.6 10*3/uL (ref 4.0–10.5)

## 2016-09-24 LAB — GLUCOSE, CAPILLARY
Glucose-Capillary: 134 mg/dL — ABNORMAL HIGH (ref 65–99)
Glucose-Capillary: 191 mg/dL — ABNORMAL HIGH (ref 65–99)
Glucose-Capillary: 242 mg/dL — ABNORMAL HIGH (ref 65–99)
Glucose-Capillary: 157 mg/dL — ABNORMAL HIGH (ref 65–99)

## 2016-09-24 LAB — TYPE AND SCREEN
ABO/RH(D): B POS
ANTIBODY SCREEN: NEGATIVE
UNIT DIVISION: 0
Unit division: 0

## 2016-09-24 MED ORDER — LIDOCAINE HCL 1 % IJ SOLN
INTRAMUSCULAR | Status: AC
  Filled 2016-09-24: qty 20

## 2016-09-24 NOTE — Progress Notes (Signed)
  Speech Language Pathology Treatment: Dysphagia  Patient Details Name: [name]. MRN: [id number] DOB: February 02, 1935 Today's Date: 09/24/2016 Time: 1400-1410 SLP Time Calculation (min) (ACUTE ONLY): 10 min  Assessment / Plan / Recommendation Clinical Impression  Checked in on pt, family at bedside reports he choked on water, but pt also noted to be laying flat in bed while family giving sips. Pt is also quite impulsive with intake, only satisfied with large continuous sips. SLP repositioned pt and he tolerated small cup sips of water without signs of aspiration. Also allowed large consecutive trials, followed by immediate cough. Trials of consecutive sips of nectar tolerated without coughing. Suspect combination of increased RR and positioning impacted pt airway protection. Timing of breathing and swallowing appears impaired. Recommend pt continue nectar thick liquids and purees today. Small sips of water with RN permitted. Educated family to importance of upright position. Will f/u tomorrow with objective swallow eval (MBS).   HPI HPI: 82 y.o.malewith history of COPD, chronic diastolic CHF, CAD status post CABG, chronic atrial fibrillation on Coumadin, intermittent agitation, and chronic wounds involving the left foot and sacrum who presentedto the EDfrom his SNF for evaluation of somnolence following an accidental overdose of Ativan, Haldol, and Benadryl. Patient was noted to be increasingly somnolent at the nursing facility and EMS was called for transport to the hospital. Per the report of his family at the bedside, he had begun to show good improvement at the nursing facility after being discharged from the hospital on 08/09/2016 after nonoperative management of right humerus fracture, treatment of bleeding ulcer with endoscopic clipping and 4 units of packed red blood cells, and drainage of a right pleural effusion.Per MD equires 2LO2 at baseline - acute decline likely due to respiratory  suppression w/ OD of 2 sedating meds - wean O2 as able but presently still requiring FM oxygen, w/ rapid severe desaturation when he pulls off his mask - cont empiric abx for possible aspiration. Seen during prior admission 08/01/16, with findings of functional swallow with sufficient oral attention, adequate mastication. Regular solids, thin liquids, meds whole in puree recommended. CXR 09/21/16 Pleural fluid on the right is similar in amount. Presumed small left pleural effusion is stable, Persistent lung opacities are noted. Appearance suggests a combination of pulmonary edema and basilar atelectasis. Pneumonia is possible. Pt has been npo x ice chips due to mental status and aspiration concerns.        SLP Plan  MBS       Recommendations  Diet recommendations: Dysphagia 1 (puree);Nectar-thick liquid Liquids provided via: Cup;Straw Medication Administration: Whole meds with puree Supervision: Full supervision/cueing for compensatory strategies;Staff to assist with self feeding Compensations: Minimize environmental distractions;Slow rate Postural Changes and/or Swallow Maneuvers: Seated upright 90 degrees;Upright 30-60 min after meal                Plan: MBS       GO               Seth Baltimore, MA CCC-SLP [phone]  [name] 09/24/2016, 2:57 PM

## 2016-09-24 NOTE — Progress Notes (Signed)
RN notified family Helene Kelp Allred) @ 09:50 of Patient's Thoracentesis procedure.

## 2016-09-24 NOTE — Progress Notes (Signed)
Pt. placed on CPAP for h/s with L/FFM, oxygen added to circuit @4lpm , humidity filled, pt. tolerating well, RN aware.

## 2016-09-24 NOTE — Progress Notes (Signed)
PROGRESS NOTE    [name].  OVZ:[id number] DOB: 1935-03-15 DOA: 08/24/2016 PCP: [name], MD\   Brief Narrative: [name]. is a 82 y.o. male with history of COPD, chronic diastolic CHF, CAD status post CABG, chronic atrial fibrillation on Coumadin, intermittent agitation, and chronic wounds involving the left foot and sacrum. He presented with altered mental status secondary to accidental drug overdose with ativan, haldol and benadryl.   Assessment & Plan:   Principal Problem:   Overdose, accidental or unintentional, initial encounter Active Problems:   DM (diabetes mellitus) type II uncontrolled, periph vascular disorder ([name])   Obstructive sleep apnea   Essential hypertension   CAD (coronary artery disease)   Chronic atrial fibrillation ([name])   Pleural effusion, left   Anemia   Chronic respiratory failure with hypoxia ([name])   AKI (acute kidney injury) ([place])   Accidental overdose   Acute on chronic respiratory failure with hypoxia ([name])   Pressure injury of skin   Protein-calorie malnutrition, severe   Acute encephalopathy Secondary to accidental drug overdose. Improving.  Syncopal episode Event occurred on 4/3 while admitted. Code blue called but no ACLS required. CTA obtained and was significant for recurrent right pleural effusion. -follow-up thoracentesis  Acute on chronic hypoxic respiratory failure Aspiration pneumonia -continue Unasyn -O2 as needed  Right pleural effusion Has had one thoracentesis on 3/31 -follow-up thoracentesis today  Chronic diastolic heart failure Previous TEE on 07/31/16 significant for EF of 55-60% with moderate concentric LVH and no wall motion abnormality. Dry weight of 79kg. Pleural effusions.  AKI on CKD 3 Baseline creatinine of 1.1. 1.29 at admission now up to 1.6 -repeat BMP  Chronic atrial fibrillation CHA2DS2-VASc Score is 4. Treated with warfarin as an outpatient. Held on admission secondary to high risk  for bleeding.  Nutrition SLP and dietitian recommendations  COPD OSA Stable -BiPAP  Chronic blood loss anemia Baseline is 8-9. Has a recent history of upper GI bleed in February 2018. Trended down recently. s/p 2 units of PRBC -continue to trend CBC  Diabetes mellitus, type 2 A1c of 7.4. -continue SSI  Multiple chronic wounds -wound care   DVT prophylaxis: SCDs Code Status: Full code Family Communication: None at bedside Disposition Plan: Discharge back to SNF when medically stable   Consultants:   Interventional radiology  Procedures:   Right thoracentesis (3/31)  Antimicrobials:  Unasyn    Subjective: Patient reports no chest pain or dyspnea. He reports being thirsty.   Objective: Vitals:   09/23/16 2200 09/23/16 2300 09/24/16 0431 09/24/16 0600  BP: 102/89 (!) 108/55  (!) 103/49  Pulse: 60 60  (!) 58  Resp: (!) 21 16  14   Temp:  98.3 F (36.8 C)    TempSrc:  Oral    SpO2: (!) 84% (!) 84%  97%  Weight:   79.8 kg (176 lb)   Height:        Intake/Output Summary (Last 24 hours) at 09/24/16 0823 Last data filed at 09/23/16 2357  Gross per 24 hour  Intake             1786 ml  Output              850 ml  Net              936 ml   Filed Weights   09/20/16 0826 09/22/16 2249 09/24/16 0431  Weight: 69.9 kg (154 lb) 69.9 kg (154 lb) 79.8 kg (176 lb)  Examination:  General exam: Appears calm and comfortable Respiratory system: Clear to auscultation with diminished breath sounds on right compared to left. Respiratory effort normal. Cardiovascular system: S1 & S2 heard, RRR. No murmurs. Gastrointestinal system: Abdomen is nondistended, soft and nontender. No organomegaly or masses felt. Normal bowel sounds heard. Central nervous system: Alert and oriented to person and place. No focal neurological deficits. Extremities: No edema. No calf tenderness Skin: No cyanosis. No rashes   Data Reviewed: I have personally reviewed following labs and  imaging studies  CBC:  Recent Labs Lab 08/30/2016 1722  09/20/16 0205 09/21/16 0340 09/22/16 0353 09/23/16 0504 09/23/16 1923 09/24/16 0235  WBC 12.9*  --  13.9* 9.5 12.7* 10.0  --  9.6  NEUTROABS 7.5  --  11.0*  --   --   --   --   --   HGB 7.9*  < > 7.5* 7.0* 7.0* 6.8* 10.4* 9.2*  HCT 28.7*  < > 27.0* 24.1* 24.8* 24.3* 32.6* 30.1*  MCV 87.5  --  87.7 88.6 88.6 87.7  --  86.2  PLT 316  --  315 217 237 184  --  184  < > = values in this interval not displayed. Basic Metabolic Panel:  Recent Labs Lab 09/20/16 0205 09/21/16 0340 09/23/16 0504 09/23/16 1923 09/23/16 2205  NA 142 142 145 145 143  K 4.0 3.9 3.7 5.8* 3.6  CL 98* 99* 103 101 101  CO2 34* 33* 33* 30 33*  GLUCOSE 117* 96 140* 165* 188*  BUN 29* 36* 53* 53* 53*  CREATININE 1.47* 1.20 1.57* 1.62* 1.61*  CALCIUM 9.1 9.0 9.3 5.4* 9.0  MG  --   --   --  1.1* 2.2  PHOS  --   --   --  3.5  --    GFR: Estimated Creatinine Clearance: 38.4 mL/min (A) (by C-G formula based on SCr of 1.61 mg/dL (H)). Liver Function Tests:  Recent Labs Lab 08/23/2016 1722 09/20/16 0205 09/21/16 0340  AST 50* 41 41  ALT 59 54 33  ALKPHOS 220* 202* 164*  BILITOT 0.7 0.9 0.8  PROT 6.7 6.1* 5.6*  ALBUMIN 2.2* 2.3* 1.9*   No results for input(s): LIPASE, AMYLASE in the last 168 hours. No results for input(s): AMMONIA in the last 168 hours. Coagulation Profile:  Recent Labs Lab 09/02/2016 1951 09/20/16 0205  INR 2.21 2.23   Cardiac Enzymes: No results for input(s): CKTOTAL, CKMB, CKMBINDEX, TROPONINI in the last 168 hours. BNP (last 3 results) No results for input(s): PROBNP in the last 8760 hours. HbA1C: No results for input(s): HGBA1C in the last 72 hours. CBG:  Recent Labs Lab 09/23/16 1010 09/23/16 1131 09/23/16 1408 09/23/16 1608 09/23/16 2144  GLUCAP 182* 180* 167* 140* 186*   Lipid Profile: No results for input(s): CHOL, HDL, LDLCALC, TRIG, CHOLHDL, LDLDIRECT in the last 72 hours. Thyroid Function Tests: No  results for input(s): TSH, T4TOTAL, FREET4, T3FREE, THYROIDAB in the last 72 hours. Anemia Panel: No results for input(s): VITAMINB12, FOLATE, FERRITIN, TIBC, IRON, RETICCTPCT in the last 72 hours. Sepsis Labs:  Recent Labs Lab 09/09/2016 1744  LATICACIDVEN 1.64    Recent Results (from the past 240 hour(s))  Body fluid culture     Status: None   Collection Time: 09/20/16  9:46 AM  Result Value Ref Range Status   Specimen Description PLEURAL RIGHT  Final   Special Requests NONE  Final   Gram Stain   Final    RARE WBC PRESENT,  PREDOMINANTLY MONONUCLEAR NO ORGANISMS SEEN    Culture NO GROWTH 3 DAYS  Final   Report Status 09/23/2016 FINAL  Final  MRSA PCR Screening     Status: None   Collection Time: 09/23/16  2:29 PM  Result Value Ref Range Status   MRSA by PCR NEGATIVE NEGATIVE Final    Comment:        The GeneXpert MRSA Assay (FDA approved for NASAL specimens only), is one component of a comprehensive MRSA colonization surveillance program. It is not intended to diagnose MRSA infection nor to guide or monitor treatment for MRSA infections.          Radiology Studies: Ct Angio Chest Pe W Or Wo Contrast  Result Date: 09/23/2016 CLINICAL DATA:  Shortness of breath. Aspiration pneumonia. Previous aortic valve replacement and pacemaker placement. EXAM: CT ANGIOGRAPHY CHEST WITH CONTRAST TECHNIQUE: Multidetector CT imaging of the chest was performed using the standard protocol during bolus administration of intravenous contrast. Multiplanar CT image reconstructions and MIPs were obtained to evaluate the vascular anatomy. CONTRAST:  80 cc Isovue 370 COMPARISON:  Portable chest dated 09/21/2016. Chest CTA dated 07/12/2004. FINDINGS: Cardiovascular: The examination is limited by artifacts produced by the patient's arms, which he refused to keep over his head due to altered mental status and agitation. The pulmonary arteries are normally opacified with no pulmonary arterial filling  defects seen. Extensive atheromatous arterial calcifications are noted, including the coronary arteries and aorta. Bypass grafts are also calcified. Mildly enlarged heart with biatrial enlargement. Mediastinum/Nodes: A previously demonstrated 14 mm short axis right lower paratracheal/azygos node currently has a short axis diameter of 13 mm on image number 53 of series 401. A previously demonstrated 13 mm short axis aortic pulmonic window lymph node has a short axis diameter of 19 mm on image number 50 of series 401. Previously demonstrated mildly enlarged bilateral hilar lymph nodes are less prominent. Lungs/Pleura: Moderate to large-sized right pleural effusion and small to moderate-sized left pleural effusion. Mild associated bilateral atelectasis, mild associated atelectasis on the left and associated collapse of the lower lobe on the right. Mild bilateral peripheral bullous changes. Mild patchy interstitial prominence bilaterally. Upper Abdomen: Dense atheromatous arterial calcifications. Musculoskeletal: Thoracic and lower cervical spine degenerative changes. Review of the MIP images confirms the above findings. IMPRESSION: 1. No pulmonary emboli. 2. Extensive atheromatous arterial calcifications involving the coronary arteries, coronary artery bypass grafts and aorta. 3. Moderate to large-sized right pleural effusion with associated compressive atelectasis collapsing the right lower lobe. 4. Small to moderate-sized left pleural effusion with mild left lower lobe atelectasis. 5. Cardiomegaly and mild interstitial pulmonary edema. 6. COPD with paraseptal emphysema. 7. Little overall change in mild mediastinal adenopathy, compatible with reactive adenopathy. 8. Improved bilateral hilar adenopathy. Electronically Signed   By: Claudie Revering M.D.   On: 09/23/2016 14:14        Scheduled Meds: . sodium chloride   Intravenous Once  . ampicillin-sulbactam (UNASYN) IV  3 g Intravenous Q6H  . chlorhexidine  15 mL  Mouth Rinse BID  . collagenase   Topical Daily  . donepezil  10 mg Oral QHS  . insulin aspart  0-5 Units Subcutaneous QHS  . insulin aspart  0-9 Units Subcutaneous TID WC  . mouth rinse  15 mL Mouth Rinse q12n4p  . metoprolol tartrate  25 mg Oral BID  . multivitamin with minerals  1 tablet Oral Daily  . nystatin   Topical BID  . pantoprazole  40 mg Oral BID  .  QUEtiapine  50 mg Oral QHS  . RESOURCE THICKENUP CLEAR   Oral BID AC & HS   Continuous Infusions:   LOS: 4 days     Cordelia Poche, MD [facility] 09/24/2016, 8:23 AM Pager: [phone]  If 7PM-7AM, please contact night-coverage [email or web address] Password [facility] 09/24/2016, 8:23 AM

## 2016-09-24 NOTE — Progress Notes (Signed)
Earlier in shift, RN paged because BMP resulted with metabolic derangement (K 5.8, Ca 6.4, Mg 1.1).  Very different results from earlier in the day. Tests reordered and resulted normal.  KJKG, NP [facility]

## 2016-09-24 NOTE — Progress Notes (Signed)
Visited patients room offered to placed pt on Cpap. Patient shook head no and said no as well.  Made RN aware.

## 2016-09-25 ENCOUNTER — Inpatient Hospital Stay (HOSPITAL_COMMUNITY): Payer: Medicare Other

## 2016-09-25 ENCOUNTER — Encounter (HOSPITAL_COMMUNITY): Payer: Self-pay | Admitting: Student

## 2016-09-25 DIAGNOSIS — G4733 Obstructive sleep apnea (adult) (pediatric): Secondary | ICD-10-CM

## 2016-09-25 DIAGNOSIS — I10 Essential (primary) hypertension: Secondary | ICD-10-CM

## 2016-09-25 HISTORY — PX: IR THORACENTESIS ASP PLEURAL SPACE W/IMG GUIDE: IMG5380

## 2016-09-25 LAB — GLUCOSE, CAPILLARY
GLUCOSE-CAPILLARY: 198 mg/dL — AB (ref 65–99)
GLUCOSE-CAPILLARY: 226 mg/dL — AB (ref 65–99)
Glucose-Capillary: 157 mg/dL — ABNORMAL HIGH (ref 65–99)
Glucose-Capillary: 186 mg/dL — ABNORMAL HIGH (ref 65–99)

## 2016-09-25 LAB — LACTATE DEHYDROGENASE, PLEURAL OR PERITONEAL FLUID: LD FL: 73 U/L — AB (ref 3–23)

## 2016-09-25 LAB — BASIC METABOLIC PANEL
Anion gap: 9 (ref 5–15)
BUN: 44 mg/dL — ABNORMAL HIGH (ref 6–20)
CHLORIDE: 100 mmol/L — AB (ref 101–111)
CO2: 33 mmol/L — ABNORMAL HIGH (ref 22–32)
Calcium: 8.9 mg/dL (ref 8.9–10.3)
Creatinine, Ser: 1.46 mg/dL — ABNORMAL HIGH (ref 0.61–1.24)
GFR, EST AFRICAN AMERICAN: 51 mL/min — AB (ref >60)
GFR, EST NON AFRICAN AMERICAN: 44 mL/min — AB (ref >60)
Glucose, Bld: 154 mg/dL — ABNORMAL HIGH (ref 65–99)
POTASSIUM: 3.3 mmol/L — AB (ref 3.5–5.1)
SODIUM: 142 mmol/L (ref 135–145)

## 2016-09-25 LAB — CBC
HEMATOCRIT: 32.4 % — AB (ref 39.0–52.0)
Hemoglobin: 9.4 g/dL — ABNORMAL LOW (ref 13.0–17.0)
MCH: 25.6 pg — ABNORMAL LOW (ref 26.0–34.0)
MCHC: 29 g/dL — ABNORMAL LOW (ref 30.0–36.0)
MCV: 88.3 fL (ref 78.0–100.0)
Platelets: 184 10*3/uL (ref 150–400)
RBC: 3.67 MIL/uL — AB (ref 4.22–5.81)
RDW: 17.6 % — AB (ref 11.5–15.5)
WBC: 13.2 10*3/uL — AB (ref 4.0–10.5)

## 2016-09-25 LAB — GRAM STAIN

## 2016-09-25 LAB — BODY FLUID CELL COUNT WITH DIFFERENTIAL
Lymphs, Fluid: 19 %
MONOCYTE-MACROPHAGE-SEROUS FLUID: 63 % (ref 50–90)
Neutrophil Count, Fluid: 18 % (ref 0–25)
WBC FLUID: 37 uL (ref 0–1000)

## 2016-09-25 LAB — LACTATE DEHYDROGENASE: LDH: 182 U/L (ref 98–192)

## 2016-09-25 LAB — PROTEIN, TOTAL: TOTAL PROTEIN: 6 g/dL — AB (ref 6.5–8.1)

## 2016-09-25 LAB — GLUCOSE, PLEURAL OR PERITONEAL FLUID: GLUCOSE FL: 166 mg/dL

## 2016-09-25 LAB — PROTEIN, PLEURAL OR PERITONEAL FLUID

## 2016-09-25 LAB — MAGNESIUM: MAGNESIUM: 2 mg/dL (ref 1.7–2.4)

## 2016-09-25 MED ORDER — LIDOCAINE HCL 1 % IJ SOLN
INTRAMUSCULAR | Status: AC
  Filled 2016-09-25: qty 20

## 2016-09-25 MED ORDER — LIDOCAINE HCL 1 % IJ SOLN
INTRAMUSCULAR | Status: DC | PRN
  Administered 2016-09-25: 10 mL

## 2016-09-25 MED ORDER — FUROSEMIDE 10 MG/ML IJ SOLN
20.0000 mg | Freq: Once | INTRAMUSCULAR | Status: AC
  Administered 2016-09-25: 20 mg via INTRAVENOUS
  Filled 2016-09-25: qty 2

## 2016-09-25 NOTE — Progress Notes (Signed)
Back from IR awake and alert

## 2016-09-25 NOTE — Progress Notes (Signed)
Pt. Had 11 beats of v-tach. Asymptomatic . MD made aware .

## 2016-09-25 NOTE — Progress Notes (Signed)
Pt. desats on 80's on 4L [place]. Placed on Venturi mask-sats went up to 98%. Continue to monitor.

## 2016-09-25 NOTE — Progress Notes (Signed)
Transported to IR for thoracentesis by bed, stable

## 2016-09-25 NOTE — Progress Notes (Signed)
Transporter came to take him for barium swallow but pt. Is sleepy and tired from IR thoracentesis. Radiology made aware,claimed to do it tom.

## 2016-09-25 NOTE — Procedures (Addendum)
PROCEDURE SUMMARY:  Successful US guided right diagnostic and therapeutic thoracentesis.  Yielded 1.4L of clear, yellow fluid.  No immediate complications.  Pt tolerated well. Procedure was stopped prior to removal of all fluid due to cough.  Specimen was sent for labs.  Docia Barrier PA-C 09/25/2016 9:45 AM

## 2016-09-25 NOTE — Progress Notes (Signed)
PROGRESS NOTE    [name]  TKZ:[id number] DOB: 12-07-1934 DOA: 09/15/2016 PCP: [name], MD   Brief Narrative: Jaiel Saraceno. is a 82 y.o. male with history of COPD, chronic diastolic CHF, CAD status post CABG, chronic atrial fibrillation on Coumadin, intermittent agitation, and chronic wounds involving the left foot and sacrum. He presented with altered mental status secondary to accidental drug overdose with ativan, haldol and benadryl.   Assessment & Plan:   Principal Problem:   Overdose, accidental or unintentional, initial encounter Active Problems:   DM (diabetes mellitus) type II uncontrolled, periph vascular disorder ([name])   Obstructive sleep apnea   Essential hypertension   CAD (coronary artery disease)   Chronic atrial fibrillation ([name])   Pleural effusion, left   Anemia   Chronic respiratory failure with hypoxia ([name])   AKI (acute kidney injury) ([place])   Accidental overdose   Acute on chronic respiratory failure with hypoxia ([name])   Pressure injury of skin   Protein-calorie malnutrition, severe   Acute encephalopathy Secondary to accidental drug overdose. Improving.  Syncopal episode Event occurred on 4/3 while admitted. Code blue called but no ACLS required. CTA obtained and was significant for recurrent right pleural effusion. -follow-up thoracentesis  Acute on chronic hypoxic respiratory failure Aspiration pneumonia Completed course of Unasyn. Persistent symptoms likely secondary to significant pleural effusion and likely atelectasis -O2 as needed  Right pleural effusion Has had one thoracentesis on 3/31. Recurrent. -follow-up thoracentesis today  Chronic diastolic heart failure Previous TEE on 07/31/16 significant for EF of 55-60% with moderate concentric LVH and no wall motion abnormality. Dry weight of 79kg. Pleural effusions. Overall appears euvolemic.  AKI on CKD 3 Baseline creatinine of 1.1. 1.29 at admission and trended up initially.  Now trended down. -repeat BMP  Chronic atrial fibrillation CHA2DS2-VASc Score is 4. Treated with warfarin as an outpatient. Held on admission secondary to high risk for bleeding.  Nutrition SLP and dietitian recommendations  COPD OSA Stable -CPAP  Chronic blood loss anemia Baseline is 8-9. Has a recent history of upper GI bleed in February 2018. Trended down recently. s/p 2 units of PRBC. Stable.  Diabetes mellitus, type 2 A1c of 7.4. -continue SSI  Multiple chronic wounds -wound care   DVT prophylaxis: SCDs Code Status: Full code Family Communication: None at bedside Disposition Plan: Discharge back to SNF when medically stable   Consultants:   Interventional radiology  Procedures:   Right thoracentesis (3/31)  Antimicrobials:  Unasyn    Subjective: No dyspnea overnight and no chest pain. Currently states he has neither. Still states he is thirsty. Venturi mask placed on him overnight for desaturations.  Objective: Vitals:   09/24/16 2000 09/25/16 0010 09/25/16 0011 09/25/16 0440  BP: (!) 110/53 (!) 103/48  (!) 118/52  Pulse: (!) 57 66 76 60  Resp: (!) 23 20 (!) 21 (!) 30  Temp:  97.1 F (36.2 C)  97.4 F (36.3 C)  TempSrc:  Axillary  Axillary  SpO2: (!) 88% (!) 84%  100%  Weight:    77.1 kg (170 lb)  Height:        Intake/Output Summary (Last 24 hours) at 09/25/16 0752 Last data filed at 09/25/16 0430  Gross per 24 hour  Intake             1040 ml  Output              600 ml  Net  440 ml   Filed Weights   09/22/16 2249 09/24/16 0431 09/25/16 0440  Weight: 69.9 kg (154 lb) 79.8 kg (176 lb) 77.1 kg (170 lb)    Examination:  General exam: Appears calm and comfortable Respiratory system: Clear to auscultation with diminished breath sounds on right compared to left. Respiratory effort normal. Cardiovascular system: S1 & S2 heard, RRR. 2/6 systolic murmur heard best at apex Gastrointestinal system: Abdomen is nondistended, soft  and nontender. No organomegaly or masses felt. Normal bowel sounds heard. Central nervous system: Alert and oriented to person and place. No focal neurological deficits. Extremities: No edema. No calf tenderness Skin: No cyanosis. No rashes   Data Reviewed: I have personally reviewed following labs and imaging studies  CBC:  Recent Labs Lab 09/15/2016 1722  09/20/16 0205 09/21/16 0340 09/22/16 0353 09/23/16 0504 09/23/16 1923 09/24/16 0235 09/25/16 0236  WBC 12.9*  --  13.9* 9.5 12.7* 10.0  --  9.6 13.2*  NEUTROABS 7.5  --  11.0*  --   --   --   --   --   --   HGB 7.9*  < > 7.5* 7.0* 7.0* 6.8* 10.4* 9.2* 9.4*  HCT 28.7*  < > 27.0* 24.1* 24.8* 24.3* 32.6* 30.1* 32.4*  MCV 87.5  --  87.7 88.6 88.6 87.7  --  86.2 88.3  PLT 316  --  315 217 237 184  --  184 184  < > = values in this interval not displayed. Basic Metabolic Panel:  Recent Labs Lab 09/21/16 0340 09/23/16 0504 09/23/16 1923 09/23/16 2205 09/25/16 0236  NA 142 145 145 143 142  K 3.9 3.7 5.8* 3.6 3.3*  CL 99* 103 101 101 100*  CO2 33* 33* 30 33* 33*  GLUCOSE 96 140* 165* 188* 154*  BUN 36* 53* 53* 53* 44*  CREATININE 1.20 1.57* 1.62* 1.61* 1.46*  CALCIUM 9.0 9.3 5.4* 9.0 8.9  MG  --   --  1.1* 2.2  --   PHOS  --   --  3.5  --   --    GFR: Estimated Creatinine Clearance: 42.4 mL/min (A) (by C-G formula based on SCr of 1.46 mg/dL (H)). Liver Function Tests:  Recent Labs Lab 09/16/2016 1722 09/20/16 0205 09/21/16 0340  AST 50* 41 41  ALT 59 54 33  ALKPHOS 220* 202* 164*  BILITOT 0.7 0.9 0.8  PROT 6.7 6.1* 5.6*  ALBUMIN 2.2* 2.3* 1.9*   No results for input(s): LIPASE, AMYLASE in the last 168 hours. No results for input(s): AMMONIA in the last 168 hours. Coagulation Profile:  Recent Labs Lab 09/08/2016 1951 09/20/16 0205  INR 2.21 2.23   Cardiac Enzymes: No results for input(s): CKTOTAL, CKMB, CKMBINDEX, TROPONINI in the last 168 hours. BNP (last 3 results) No results for input(s): PROBNP in  the last 8760 hours. HbA1C: No results for input(s): HGBA1C in the last 72 hours. CBG:  Recent Labs Lab 09/23/16 2144 09/24/16 0823 09/24/16 1209 09/24/16 1656 09/24/16 2135  GLUCAP 186* 134* 191* 242* 157*   Lipid Profile: No results for input(s): CHOL, HDL, LDLCALC, TRIG, CHOLHDL, LDLDIRECT in the last 72 hours. Thyroid Function Tests: No results for input(s): TSH, T4TOTAL, FREET4, T3FREE, THYROIDAB in the last 72 hours. Anemia Panel: No results for input(s): VITAMINB12, FOLATE, FERRITIN, TIBC, IRON, RETICCTPCT in the last 72 hours. Sepsis Labs:  Recent Labs Lab 09/07/2016 1744  LATICACIDVEN 1.64    Recent Results (from the past 240 hour(s))  Body fluid culture  Status: None   Collection Time: 09/20/16  9:46 AM  Result Value Ref Range Status   Specimen Description PLEURAL RIGHT  Final   Special Requests NONE  Final   Gram Stain   Final    RARE WBC PRESENT, PREDOMINANTLY MONONUCLEAR NO ORGANISMS SEEN    Culture NO GROWTH 3 DAYS  Final   Report Status 09/23/2016 FINAL  Final  MRSA PCR Screening     Status: None   Collection Time: 09/23/16  2:29 PM  Result Value Ref Range Status   MRSA by PCR NEGATIVE NEGATIVE Final    Comment:        The GeneXpert MRSA Assay (FDA approved for NASAL specimens only), is one component of a comprehensive MRSA colonization surveillance program. It is not intended to diagnose MRSA infection nor to guide or monitor treatment for MRSA infections.          Radiology Studies: Ct Angio Chest Pe W Or Wo Contrast  Result Date: 09/23/2016 CLINICAL DATA:  Shortness of breath. Aspiration pneumonia. Previous aortic valve replacement and pacemaker placement. EXAM: CT ANGIOGRAPHY CHEST WITH CONTRAST TECHNIQUE: Multidetector CT imaging of the chest was performed using the standard protocol during bolus administration of intravenous contrast. Multiplanar CT image reconstructions and MIPs were obtained to evaluate the vascular anatomy.  CONTRAST:  80 cc Isovue 370 COMPARISON:  Portable chest dated 09/21/2016. Chest CTA dated 07/12/2004. FINDINGS: Cardiovascular: The examination is limited by artifacts produced by the patient's arms, which he refused to keep over his head due to altered mental status and agitation. The pulmonary arteries are normally opacified with no pulmonary arterial filling defects seen. Extensive atheromatous arterial calcifications are noted, including the coronary arteries and aorta. Bypass grafts are also calcified. Mildly enlarged heart with biatrial enlargement. Mediastinum/Nodes: A previously demonstrated 14 mm short axis right lower paratracheal/azygos node currently has a short axis diameter of 13 mm on image number 53 of series 401. A previously demonstrated 13 mm short axis aortic pulmonic window lymph node has a short axis diameter of 19 mm on image number 50 of series 401. Previously demonstrated mildly enlarged bilateral hilar lymph nodes are less prominent. Lungs/Pleura: Moderate to large-sized right pleural effusion and small to moderate-sized left pleural effusion. Mild associated bilateral atelectasis, mild associated atelectasis on the left and associated collapse of the lower lobe on the right. Mild bilateral peripheral bullous changes. Mild patchy interstitial prominence bilaterally. Upper Abdomen: Dense atheromatous arterial calcifications. Musculoskeletal: Thoracic and lower cervical spine degenerative changes. Review of the MIP images confirms the above findings. IMPRESSION: 1. No pulmonary emboli. 2. Extensive atheromatous arterial calcifications involving the coronary arteries, coronary artery bypass grafts and aorta. 3. Moderate to large-sized right pleural effusion with associated compressive atelectasis collapsing the right lower lobe. 4. Small to moderate-sized left pleural effusion with mild left lower lobe atelectasis. 5. Cardiomegaly and mild interstitial pulmonary edema. 6. COPD with paraseptal  emphysema. 7. Little overall change in mild mediastinal adenopathy, compatible with reactive adenopathy. 8. Improved bilateral hilar adenopathy. Electronically Signed   By: Claudie Revering M.D.   On: 09/23/2016 14:14   Dg Chest Port 1 View  Result Date: 09/25/2016 CLINICAL DATA:  Shortness of breath EXAM: PORTABLE CHEST 1 VIEW COMPARISON:  Portable chest x-ray of September 21, 2016 and chest CT scan of September 23, 2016. FINDINGS: There remains a large right pleural effusion. The pulmonary interstitial markings of both lungs remain increased. The pulmonary vascularity remains engorged. The cardiac silhouette is enlarged. The permanent pacemaker is  in stable position. There is dense calcification in the wall of the aortic arch. The patient has undergone previous median sternotomy and aortic valve replacement. IMPRESSION: CHF with slight worsening interstitial edema and right pleural effusion. Thoracic aortic atherosclerosis. Electronically Signed   By: David  Martinique M.D.   On: 09/25/2016 07:41        Scheduled Meds: . chlorhexidine  15 mL Mouth Rinse BID  . collagenase   Topical Daily  . donepezil  10 mg Oral QHS  . insulin aspart  0-5 Units Subcutaneous QHS  . insulin aspart  0-9 Units Subcutaneous TID WC  . mouth rinse  15 mL Mouth Rinse q12n4p  . metoprolol tartrate  25 mg Oral BID  . multivitamin with minerals  1 tablet Oral Daily  . nystatin   Topical BID  . pantoprazole  40 mg Oral BID  . QUEtiapine  50 mg Oral QHS  . RESOURCE THICKENUP CLEAR   Oral BID AC & HS   Continuous Infusions:   LOS: 5 days     Cordelia Poche, MD [facility] 09/25/2016, 7:52 AM Pager: [phone]  If 7PM-7AM, please contact night-coverage [email or web address] Password [email or web address] 09/25/2016, 7:52 AM

## 2016-09-25 NOTE — Progress Notes (Signed)
Patient does not wish to wear CPAP tonight. RN aware to call respiratory if patient changes his mind.

## 2016-09-25 NOTE — Progress Notes (Signed)
Called by RN by placed on Cpap due to desating while sleeping .Marland Kitchen I visited pts room and checked to see if needed adjustments to Cpap etc.  Pt tolerating well at this time.

## 2016-09-26 ENCOUNTER — Inpatient Hospital Stay (HOSPITAL_COMMUNITY): Payer: Medicare Other

## 2016-09-26 DIAGNOSIS — J90 Pleural effusion, not elsewhere classified: Secondary | ICD-10-CM

## 2016-09-26 DIAGNOSIS — I2510 Atherosclerotic heart disease of native coronary artery without angina pectoris: Secondary | ICD-10-CM

## 2016-09-26 LAB — PH, BODY FLUID: PH, BODY FLUID: 7.5

## 2016-09-26 LAB — BASIC METABOLIC PANEL
Anion gap: 6 (ref 5–15)
BUN: 36 mg/dL — ABNORMAL HIGH (ref 6–20)
CHLORIDE: 102 mmol/L (ref 101–111)
CO2: 34 mmol/L — AB (ref 22–32)
CREATININE: 1.21 mg/dL (ref 0.61–1.24)
Calcium: 8.8 mg/dL — ABNORMAL LOW (ref 8.9–10.3)
GFR calc Af Amer: >60 mL/min (ref >60)
GFR calc non Af Amer: 55 mL/min — ABNORMAL LOW (ref >60)
GLUCOSE: 146 mg/dL — AB (ref 65–99)
Potassium: 3.6 mmol/L (ref 3.5–5.1)
Sodium: 142 mmol/L (ref 135–145)

## 2016-09-26 LAB — CBC
HEMATOCRIT: 33.4 % — AB (ref 39.0–52.0)
HEMOGLOBIN: 10 g/dL — AB (ref 13.0–17.0)
MCH: 26.7 pg (ref 26.0–34.0)
MCHC: 29.9 g/dL — AB (ref 30.0–36.0)
MCV: 89.1 fL (ref 78.0–100.0)
Platelets: 124 10*3/uL — ABNORMAL LOW (ref 150–400)
RBC: 3.75 MIL/uL — ABNORMAL LOW (ref 4.22–5.81)
RDW: 18.3 % — ABNORMAL HIGH (ref 11.5–15.5)
WBC: 9.9 10*3/uL (ref 4.0–10.5)

## 2016-09-26 LAB — GLUCOSE, CAPILLARY
Glucose-Capillary: 150 mg/dL — ABNORMAL HIGH (ref 65–99)
Glucose-Capillary: 118 mg/dL — ABNORMAL HIGH (ref 65–99)
Glucose-Capillary: 182 mg/dL — ABNORMAL HIGH (ref 65–99)
Glucose-Capillary: 204 mg/dL — ABNORMAL HIGH (ref 65–99)

## 2016-09-26 MED ORDER — FUROSEMIDE 10 MG/ML IJ SOLN
40.0000 mg | Freq: Once | INTRAMUSCULAR | Status: AC
  Administered 2016-09-26: 40 mg via INTRAVENOUS
  Filled 2016-09-26: qty 4

## 2016-09-26 MED FILL — Medication: Qty: 1 | Status: AC

## 2016-09-26 NOTE — Progress Notes (Signed)
Modified Barium Swallow Progress Note  Patient Details  Name: [name]. MRN: [id number] Date of Birth: [birth year]  Today's Date: 09/26/2016  Modified Barium Swallow completed.  Full report located under Chart Review in the Imaging Section.  Brief recommendations include the following:  Clinical Impression  [name] demonstrates improved alertness, however continues to require verbal cues to slow rate due to impulsivity. He presents with a mild pharyngeal dysphagia characterized by minimal pyriform sinus residue and mild vallecular residue with each tested consistency, partially due to the curvature of his epiglottis, although he was able to reduce quantity of residue when effort was put forth during the swallow. One instance of deep penetration with consecutive large sips of thin liquids was ultimately cleared with a second swallow. Overall, [name] oropharyngeal swallowing mechanism was adequate and he was able to achieve airway protection during this study. Educated pt re: results of MBS and safe swallow precautions with an emphasis on an upright position during intake of POs and the importance of small sips/slow rate. Recommend regular solids, thin liquids (straws ok), meds whole in puree. ST will follow up briefly to assess diet tolerance and for continued education.    Swallow Evaluation Recommendations       SLP Diet Recommendations: Regular solids;Thin liquid   Liquid Administration via: Cup;Straw   Medication Administration: Whole meds with puree   Supervision: Full supervision/cueing for compensatory strategies;Staff to assist with self feeding   Compensations: Slow rate;Small sips/bites;Minimize environmental distractions   Postural Changes: Seated upright at 90 degrees   Oral Care Recommendations: Oral care BID      Completed by Fransisca Kaufmann, SLP Student Supervised and reviewed by [name] MA CCC-SLP   Mora Pedraza, Katherene Ponto 09/26/2016,11:07  AM

## 2016-09-26 NOTE — Progress Notes (Signed)
PROGRESS NOTE    [name].  WFU:[id number] DOB: [birth year] DOA: 09/17/2016 PCP: [name], MD   Brief Narrative: [name]. is a 82 y.o. male with history of COPD, chronic diastolic CHF, CAD status post CABG, chronic atrial fibrillation on Coumadin, intermittent agitation, and chronic wounds involving the left foot and sacrum. He presented with altered mental status secondary to accidental drug overdose with ativan, haldol and benadryl.   Assessment & Plan:   Principal Problem:   Overdose, accidental or unintentional, initial encounter Active Problems:   DM (diabetes mellitus) type II uncontrolled, periph vascular disorder ([name])   Obstructive sleep apnea   Essential hypertension   CAD (coronary artery disease)   Chronic atrial fibrillation ([name])   Pleural effusion, left   Anemia   Chronic respiratory failure with hypoxia ([name])   AKI (acute kidney injury) ([place])   Accidental overdose   Acute on chronic respiratory failure with hypoxia ([name])   Pressure injury of skin   Protein-calorie malnutrition, severe   Acute encephalopathy Secondary to accidental drug overdose. Improved.  Syncopal episode Event occurred on 4/3 while admitted. Code blue called but no ACLS required. CTA obtained and was significant for recurrent right pleural effusion.  Acute on chronic hypoxic respiratory failure Aspiration pneumonia Completed course of Unasyn. Persistent symptoms likely secondary to significant pleural effusion and likely atelectasis -O2 as needed -diurese  Right pleural effusion Has had one thoracentesis on 3/31. Recurrent. -follow-up thoracentesis today  Chronic diastolic heart failure Previous TEE on 07/31/16 significant for EF of 55-60% with moderate concentric LVH and no wall motion abnormality. Dry weight of 79kg. Pleural effusions. -lasix 40mg   AKI on CKD 3 Baseline creatinine of 1.1. 1.29 at admission and trended up initially. Now trended down.  Chronic  atrial fibrillation CHA2DS2-VASc Score is 4. Treated with warfarin as an outpatient. Held on admission secondary to high risk for bleeding.  Nutrition SLP and dietitian recommendations  COPD OSA Stable -CPAP  Chronic blood loss anemia Baseline is 8-9. Has a recent history of upper GI bleed in February 2018. Trended down recently. s/p 2 units of PRBC. Stable.  Diabetes mellitus, type 2 A1c of 7.4. -continue SSI  Multiple chronic wounds -wound care  Thrombocytopenia New. No evidence of bleeding or new rashes. -repeat CBC   DVT prophylaxis: SCDs Code Status: Full code Family Communication: Called wife with no response   Disposition Plan: Discharge back to SNF when medically stable   Consultants:   Interventional radiology  Procedures:   Right thoracentesis (3/31)  Antimicrobials:  Unasyn    Subjective: No chest pain. No dyspnea. No leg swelling.   Objective: Vitals:   09/26/16 0346 09/26/16 0400 09/26/16 0802 09/26/16 1230  BP: (!) 106/50 (!) 106/49 (!) 105/46 (!) 114/53  Pulse: 61 60 61 63  Resp: (!) 26 18 (!) 25 (!) 21  Temp: 97.5 F (36.4 C)  97.3 F (36.3 C) 97.4 F (36.3 C)  TempSrc: Oral  Oral Oral  SpO2: 95% 96% 96% 99%  Weight: 72.1 kg (159 lb)     Height:        Intake/Output Summary (Last 24 hours) at 09/26/16 1331 Last data filed at 09/26/16 0800  Gross per 24 hour  Intake              460 ml  Output              901 ml  Net             -  441 ml   Filed Weights   09/24/16 0431 09/25/16 0440 09/26/16 0346  Weight: 79.8 kg (176 lb) 77.1 kg (170 lb) 72.1 kg (159 lb)    Examination:  General exam: Appears calm and comfortable Respiratory system: Clear to auscultation with diminished breath sounds bilaterally. Respiratory effort normal. Cardiovascular system: S1 & S2 heard, RRR. 2/6 systolic murmur heard best at apex Gastrointestinal system: Abdomen is nondistended, soft and nontender. Normal bowel sounds heard. Central nervous  system: Alert and oriented to person and place. No focal neurological deficits. Extremities: No edema. No calf tenderness Skin: No cyanosis. No rashes   Data Reviewed: I have personally reviewed following labs and imaging studies  CBC:  Recent Labs Lab 09/13/2016 1722  09/20/16 0205  09/22/16 0353 09/23/16 0504 09/23/16 1923 09/24/16 0235 09/25/16 0236 09/26/16 0740  WBC 12.9*  --  13.9*  < > 12.7* 10.0  --  9.6 13.2* 9.9  NEUTROABS 7.5  --  11.0*  --   --   --   --   --   --   --   HGB 7.9*  < > 7.5*  < > 7.0* 6.8* 10.4* 9.2* 9.4* 10.0*  HCT 28.7*  < > 27.0*  < > 24.8* 24.3* 32.6* 30.1* 32.4* 33.4*  MCV 87.5  --  87.7  < > 88.6 87.7  --  86.2 88.3 89.1  PLT 316  --  315  < > 237 184  --  184 184 124*  < > = values in this interval not displayed. Basic Metabolic Panel:  Recent Labs Lab 09/23/16 0504 09/23/16 1923 09/23/16 2205 09/25/16 0236 09/26/16 0740  NA 145 145 143 142 142  K 3.7 5.8* 3.6 3.3* 3.6  CL 103 101 101 100* 102  CO2 33* 30 33* 33* 34*  GLUCOSE 140* 165* 188* 154* 146*  BUN 53* 53* 53* 44* 36*  CREATININE 1.57* 1.62* 1.61* 1.46* 1.21  CALCIUM 9.3 5.4* 9.0 8.9 8.8*  MG  --  1.1* 2.2 2.0  --   PHOS  --  3.5  --   --   --    GFR: Estimated Creatinine Clearance: 50.5 mL/min (by C-G formula based on SCr of 1.21 mg/dL). Liver Function Tests:  Recent Labs Lab 09/20/2016 1722 09/20/16 0205 09/21/16 0340 09/25/16 0236  AST 50* 41 41  --   ALT 59 54 33  --   ALKPHOS 220* 202* 164*  --   BILITOT 0.7 0.9 0.8  --   PROT 6.7 6.1* 5.6* 6.0*  ALBUMIN 2.2* 2.3* 1.9*  --    No results for input(s): LIPASE, AMYLASE in the last 168 hours. No results for input(s): AMMONIA in the last 168 hours. Coagulation Profile:  Recent Labs Lab 08/30/2016 1951 09/20/16 0205  INR 2.21 2.23   Cardiac Enzymes: No results for input(s): CKTOTAL, CKMB, CKMBINDEX, TROPONINI in the last 168 hours. BNP (last 3 results) No results for input(s): PROBNP in the last 8760  hours. HbA1C: No results for input(s): HGBA1C in the last 72 hours. CBG:  Recent Labs Lab 09/25/16 1241 09/25/16 1645 09/25/16 2143 09/26/16 0806 09/26/16 1228  GLUCAP 198* 226* 186* 150* 118*   Lipid Profile: No results for input(s): CHOL, HDL, LDLCALC, TRIG, CHOLHDL, LDLDIRECT in the last 72 hours. Thyroid Function Tests: No results for input(s): TSH, T4TOTAL, FREET4, T3FREE, THYROIDAB in the last 72 hours. Anemia Panel: No results for input(s): VITAMINB12, FOLATE, FERRITIN, TIBC, IRON, RETICCTPCT in the last 72 hours. Sepsis Labs:  Recent Labs Lab 08/28/2016 1744  LATICACIDVEN 1.64    Recent Results (from the past 240 hour(s))  Body fluid culture     Status: None   Collection Time: 09/20/16  9:46 AM  Result Value Ref Range Status   Specimen Description PLEURAL RIGHT  Final   Special Requests NONE  Final   Gram Stain   Final    RARE WBC PRESENT, PREDOMINANTLY MONONUCLEAR NO ORGANISMS SEEN    Culture NO GROWTH 3 DAYS  Final   Report Status 09/23/2016 FINAL  Final  MRSA PCR Screening     Status: None   Collection Time: 09/23/16  2:29 PM  Result Value Ref Range Status   MRSA by PCR NEGATIVE NEGATIVE Final    Comment:        The GeneXpert MRSA Assay (FDA approved for NASAL specimens only), is one component of a comprehensive MRSA colonization surveillance program. It is not intended to diagnose MRSA infection nor to guide or monitor treatment for MRSA infections.   Gram stain     Status: None   Collection Time: 09/25/16  9:47 AM  Result Value Ref Range Status   Specimen Description Pleural R  Final   Special Requests NONE  Final   Gram Stain   Final    WBC PRESENT,BOTH PMN AND MONONUCLEAR NO ORGANISMS SEEN CYTOSPIN SMEAR    Report Status 09/25/2016 FINAL  Final         Radiology Studies: Dg Chest 1 View  Result Date: 09/25/2016 CLINICAL DATA:  Status post thoracentesis. EXAM: CHEST 1 VIEW COMPARISON:  Radiograph of same day. FINDINGS: Stable  cardiomediastinal silhouette. Atherosclerosis of thoracic aorta is noted. No pneumothorax is noted status post thoracentesis. Right pleural effusion is significantly smaller. Bilateral perihilar and basilar opacities remain concerning for edema. Stable mild left pleural effusion is noted. Sternotomy wires are noted. Left-sided pacemaker is unchanged in position. Bony thorax is unremarkable. IMPRESSION: No pneumothorax status post thoracentesis. Right pleural effusion is significantly smaller. Electronically Signed   By: Marijo Conception, M.D.   On: 09/25/2016 10:07   Dg Chest Port 1 View  Result Date: 09/26/2016 CLINICAL DATA:  Respiratory failure.  Hypoxia. EXAM: PORTABLE CHEST 1 VIEW COMPARISON:  09/25/2016. FINDINGS: Cardiac pacer with lead tip in the right ventricle. Prior CABG and cardiac valve replacement. Cardiomegaly with diffuse bilateral pulmonary infiltrates and bilateral pleural effusions again noted consistent with CHF. No interim change. No pneumothorax. Distal right clavicle resection, no change from prior exam. IMPRESSION: Cardiac pacer noted in stable position. Prior CABG and cardiac valve replacement . Persistent changes of congestive heart failure with bilateral pulmonary edema and bilateral pleural effusions. No change from prior exam. Electronically Signed   By: Marcello Moores  Register   On: 09/26/2016 09:03   Dg Chest Port 1 View  Result Date: 09/25/2016 CLINICAL DATA:  Shortness of breath EXAM: PORTABLE CHEST 1 VIEW COMPARISON:  Portable chest x-ray of September 21, 2016 and chest CT scan of September 23, 2016. FINDINGS: There remains a large right pleural effusion. The pulmonary interstitial markings of both lungs remain increased. The pulmonary vascularity remains engorged. The cardiac silhouette is enlarged. The permanent pacemaker is in stable position. There is dense calcification in the wall of the aortic arch. The patient has undergone previous median sternotomy and aortic valve replacement.  IMPRESSION: CHF with slight worsening interstitial edema and right pleural effusion. Thoracic aortic atherosclerosis. Electronically Signed   By: David  Martinique M.D.   On: 09/25/2016 07:41   Dg  Swallowing Func-speech Pathology  Result Date: 09/26/2016 Objective Swallowing Evaluation: Type of Study: MBS-Modified Barium Swallow Study Patient Details Name: Deondrea Markos. MRN: [id number] Date of Birth: 01-Oct-1934 Today's Date: 09/26/2016 Time: SLP Start Time (ACUTE ONLY): 0920-SLP Stop Time (ACUTE ONLY): 0936 SLP Time Calculation (min) (ACUTE ONLY): 16 min Past Medical History: Past Medical History: Diagnosis Date . Aortal stenosis 2006  biophrostetic . Arthritis  . Atrial fibrillation ([place])   onset after CABG--- coumadin management [facility] . Carotid artery disease ([place]) 09/27/2010  Carotid US 12/17: bilat ICA 40-59 >> 1 year FU . CHF (congestive heart failure) ([place])   abter CABG . Complete heart block ([place])  . Coronary artery disease  . DM type 2 (diabetes mellitus, type 2) ([place])   used to see Dr Meredith Pel, now f/u by Dr Larose Kells . Erectile dysfunction  . Gangrene ([place])  . History of nephrolithiasis  . HTN (hypertension)  . Hyperlipidemia  . Memory loss  . Obesity  . OSA (obstructive sleep apnea)   not using CPAP . Pacemaker 2/07  VVI . PVD (peripheral vascular disease) ([name])   CT angio showed- vascular insuff, intestine and RAS bilaterally, andgiogram 02/2009 severe PVD medical management . S/P aortic valve replacement  . Sick sinus syndrome ([name])   [name] . Skin cancer   surgery (nose) 09-2010 Past Surgical History: Past Surgical History: Procedure Laterality Date . ABDOMINAL AORTAGRAM N/A 11/10/2012  Procedure: ABDOMINAL Maxcine Ham;  Surgeon: Serafina Mitchell, MD;  Location: [facility] CATH LAB;  Service: Cardiovascular;  Laterality: N/A; . AMPUTATION  01/2010  great toe . AMPUTATION Left 07/16/2016  Procedure: RECECTION OF LEFT GREAT TOE METATARSAL HEAD and sesamoid;  Surgeon: Waynetta Sandy, MD;   Location: [facility];  Service: Vascular;  Laterality: Left; . AMPUTATION Left 07/31/2016  Procedure: SECOND TOE AMPUTATION;  Surgeon: Serafina Mitchell, MD;  Location: [place] OR;  Service: Vascular;  Laterality: Left; . ANGIOPLASTY / STENTING FEMORAL  02/10/10  Left femoral . ANGIOPLASTY ILLIAC ARTERY Left 07/16/2016  Procedure: drug coated Balloon ANGIOPLASTY of left superficial femoral artry stent;  Surgeon: Waynetta Sandy, MD;  Location: [facility];  Service: Vascular;  Laterality: Left; . AORTIC VALVE REPLACEMENT    biophrostetic for Ao Stenosis 2006 . AORTIC VALVE REPLACEMENT  01/17/2011  S/P redo median sternotomy, extracorporeal circulation, redo Aortic Valve Replacement using a 23-mm Edwards pericardial Magna-Ease valve, Dr Ulla Gallo . AORTOGRAM N/A 07/16/2016  Procedure: left lower extremity angiogram;  Surgeon: Waynetta Sandy, MD;  Location: [place];  Service: Vascular;  Laterality: N/A; . APPLICATION OF WOUND VAC Left 08/28/1694  Procedure: APPLICATION OF WOUND VAC;  Surgeon: Serafina Mitchell, MD;  Location: [place];  Service: Vascular;  Laterality: Left; . CATARACT EXTRACTION, BILATERAL   . CORONARY ARTERY BYPASS GRAFT  2006 . EP IMPLANTABLE DEVICE N/A 06/06/2016  MDT Generator change with Advisa SR MRI device implanted by Dr Rayann Heman . ESOPHAGOGASTRODUODENOSCOPY (EGD) WITH PROPOFOL N/A 08/07/2016  Procedure: ESOPHAGOGASTRODUODENOSCOPY (EGD) WITH PROPOFOL;  Surgeon: Mauri Pole, MD;  Location: [facility];  Service: Endoscopy;  Laterality: N/A; . EYE SURGERY   . ILIAC ATHERECTOMY Left 07/16/2016  Procedure: Angioplasty and ATHERECTOMY of left anterior tibial artery;  Surgeon: Waynetta Sandy, MD;  Location: [facility];  Service: Vascular;  Laterality: Left; . INCISE AND DRAIN ABCESS Right May 2014  right heel  . IR THORACENTESIS ASP PLEURAL SPACE W/IMG GUIDE  09/25/2016 . LOWER EXTREMITY ANGIOGRAM Left 11/23/2012  Procedure: LOWER EXTREMITY ANGIOGRAM;  Surgeon: Serafina Mitchell, MD;  Location: [facility] CATH  LAB;  Service: Cardiovascular;  Laterality: Left; . PACEMAKER PLACEMENT  07/2005  Medtronic Sigma SR implanted by Dr Olevia Perches . PERIPHERAL VASCULAR CATHETERIZATION N/A 07/10/2016  Procedure: Abdominal Aortogram;  Surgeon: Conrad Angus, MD;  Location: [place] CV LAB;  Service: Cardiovascular;  Laterality: N/A; . PERIPHERAL VASCULAR CATHETERIZATION Bilateral 07/10/2016  Procedure: Lower Extremity Angiography;  Surgeon: Conrad Broeck Pointe, MD;  Location: [place] CV LAB;  Service: Cardiovascular;  Laterality: Bilateral; . TONSILLECTOMY   . ULTRASOUND GUIDANCE FOR VASCULAR ACCESS Bilateral 07/16/2016  Procedure: ULTRASOUND GUIDANCE FOR VASCULAR ACCESS of right common femoral and left dorsalis pedis;  Surgeon: Waynetta Sandy, MD;  Location: [facility] OR;  Service: Vascular;  Laterality: Bilateral; . WOUND DEBRIDEMENT Left 07/31/2016  Procedure: DEBRIDEMENT GREAT TOE AMPUTATION SITE;  Surgeon: Serafina Mitchell, MD;  Location: [place];  Service: Vascular;  Laterality: Left; HPI: 82 y.o.malewith history of COPD, chronic diastolic CHF, CAD status post CABG, chronic atrial fibrillation on Coumadin, intermittent agitation, and chronic wounds involving the left foot and sacrum who presentedto the EDfrom his SNF for evaluation of somnolence following an accidental overdose of Ativan, Haldol, and Benadryl. Patient was noted to be increasingly somnolent at the nursing facility and EMS was called for transport to the hospital. Per the report of his family at the bedside, he had begun to show good improvement at the nursing facility after being discharged from the hospital on 08/09/2016 after nonoperative management of right humerus fracture, treatment of bleeding ulcer with endoscopic clipping and 4 units of packed red blood cells, and drainage of a right pleural effusion.Per MD equires 2LO2 at baseline - acute decline likely due to respiratory suppression w/ OD of 2 sedating meds - wean O2 as able but presently still requiring FM  oxygen, w/ rapid severe desaturation when he pulls off his mask - cont empiric abx for possible aspiration. Seen during prior admission 08/01/16, with findings of functional swallow with sufficient oral attention, adequate mastication. Regular solids, thin liquids, meds whole in puree recommended. CXR 09/21/16 Pleural fluid on the right is similar in amount. Presumed small left pleural effusion is stable, Persistent lung opacities are noted. Appearance suggests a combination of pulmonary edema and basilar atelectasis. Pneumonia is possible. Pt has been npo x ice chips due to mental status and aspiration concerns.   Subjective: Pt verbally agitated, on venturi mask Assessment / Plan / Recommendation CHL IP CLINICAL IMPRESSIONS 09/26/2016 Clinical Impression Mr. Power demonstrates improved alertness, however continues to require verbal cues to slow rate due to impulsivity. He presents with a mild pharyngeal dysphagia characterized by minimal pyriform sinus residue and mild vallecular residue with each tested consistency, partially due to the curvature of his epiglottis, although he was able to reduce quantity of residue when effort was put forth during the swallow. One instance of deep penetration with consecutive large sips of thin liquids was ultimately cleared with a second swallow. Overall, Mr. Wiswell oropharyngeal swallowing mechanism was adequate and he was able to achieve airway protection during this study. Educated pt re: results of MBS and safe swallow precautions with an emphasis on an upright position during intake of POs and the importance of small sips/slow rate. Recommend regular solids, thin liquids (straws ok), meds whole in puree. ST will follow up briefly to assess diet tolerance and for continued education.  SLP Visit Diagnosis Dysphagia, unspecified (R13.10) Attention and concentration deficit following -- Frontal lobe and executive function deficit following --  Impact on safety and function Mild  aspiration risk   CHL IP TREATMENT RECOMMENDATION 09/26/2016 Treatment Recommendations Therapy as outlined in treatment plan below   Prognosis 09/26/2016 Prognosis for Safe Diet Advancement Good Barriers to Reach Goals -- Barriers/Prognosis Comment -- CHL IP DIET RECOMMENDATION 09/26/2016 SLP Diet Recommendations Regular solids;Thin liquid Liquid Administration via Cup;Straw Medication Administration Whole meds with puree Compensations Slow rate;Small sips/bites;Minimize environmental distractions Postural Changes Seated upright at 90 degrees   CHL IP OTHER RECOMMENDATIONS 09/26/2016 Recommended Consults -- Oral Care Recommendations Oral care BID Other Recommendations --   CHL IP FOLLOW UP RECOMMENDATIONS 09/26/2016 Follow up Recommendations Skilled Nursing facility   [facility] IP FREQUENCY AND DURATION 09/26/2016 Speech Therapy Frequency (ACUTE ONLY) min 2x/week Treatment Duration 1 week      CHL IP ORAL PHASE 09/26/2016 Oral Phase WFL Oral - Pudding Teaspoon -- Oral - Pudding Cup -- Oral - Honey Teaspoon -- Oral - Honey Cup -- Oral - Nectar Teaspoon -- Oral - Nectar Cup -- Oral - Nectar Straw -- Oral - Thin Teaspoon -- Oral - Thin Cup -- Oral - Thin Straw -- Oral - Puree -- Oral - Mech Soft -- Oral - Regular -- Oral - Multi-Consistency -- Oral - Pill -- Oral Phase - Comment --  CHL IP PHARYNGEAL PHASE 09/26/2016 Pharyngeal Phase Impaired Pharyngeal- Pudding Teaspoon -- Pharyngeal -- Pharyngeal- Pudding Cup -- Pharyngeal -- Pharyngeal- Honey Teaspoon -- Pharyngeal -- Pharyngeal- Honey Cup -- Pharyngeal -- Pharyngeal- Nectar Teaspoon -- Pharyngeal -- Pharyngeal- Nectar Cup -- Pharyngeal -- Pharyngeal- Nectar Straw -- Pharyngeal -- Pharyngeal- Thin Teaspoon -- Pharyngeal -- Pharyngeal- Thin Cup Pharyngeal residue - valleculae;Delayed swallow initiation-pyriform sinuses;Penetration/Aspiration during swallow;Reduced epiglottic inversion Pharyngeal Material enters airway, passes BELOW cords then ejected out Pharyngeal- Thin Straw Pharyngeal  residue - valleculae;Pharyngeal residue - pyriform;Reduced epiglottic inversion Pharyngeal -- Pharyngeal- Puree Pharyngeal residue - valleculae;Reduced epiglottic inversion Pharyngeal -- Pharyngeal- Mechanical Soft -- Pharyngeal -- Pharyngeal- Regular Pharyngeal residue - valleculae;Reduced epiglottic inversion Pharyngeal -- Pharyngeal- Multi-consistency -- Pharyngeal -- Pharyngeal- Pill -- Pharyngeal -- Pharyngeal Comment --  CHL IP CERVICAL ESOPHAGEAL PHASE 09/26/2016 Cervical Esophageal Phase WFL Pudding Teaspoon -- Pudding Cup -- Honey Teaspoon -- Honey Cup -- Nectar Teaspoon -- Nectar Cup -- Nectar Straw -- Thin Teaspoon -- Thin Cup -- Thin Straw -- Puree -- Mechanical Soft -- Regular -- Multi-consistency -- Pill -- Cervical Esophageal Comment -- No flowsheet data found. Completed by Fransisca Kaufmann, SLP Student Supervised and reviewed by [name] MA CCC-SLP [place], Katherene Ponto 09/26/2016, 11:05 AM              Ir Thoracentesis Asp Pleural Space W/img Guide  Result Date: 09/25/2016 INDICATION: Patient with right pleural effusion. Request is made for diagnostic and therapeutic thoracentesis. EXAM: ULTRASOUND GUIDED DIAGNOSTIC AND THERAPEUTIC RIGHT THORACENTESIS MEDICATIONS: 10 mL 1% lidocaine. COMPLICATIONS: None immediate. PROCEDURE: An ultrasound guided thoracentesis was thoroughly discussed with the patient and questions answered. The benefits, risks, alternatives and complications were also discussed. The patient understands and wishes to proceed with the procedure. Written consent was obtained. Ultrasound was performed to localize and mark an adequate pocket of fluid in the right chest. The area was then prepped and draped in the normal sterile fashion. 1% Lidocaine was used for local anesthesia. Under ultrasound guidance a Safe-T-centesis catheter was introduced. Thoracentesis was performed. The catheter was removed and a dressing applied. FINDINGS: A total of approximately 1.4 liters of clear,  yellow fluid was removed. Samples were sent to the laboratory as requested  by the clinical team. Procedure was stopped prior to removal of all fluid due to cough. IMPRESSION: Successful ultrasound guided diagnostic and therapeutic right thoracentesis yielding 1.4 liters of pleural fluid. Read by:  Brynda Greathouse PA-C Electronically Signed   By: Lucrezia Europe M.D.   On: 09/25/2016 09:57        Scheduled Meds: . chlorhexidine  15 mL Mouth Rinse BID  . collagenase   Topical Daily  . donepezil  10 mg Oral QHS  . furosemide  40 mg Intravenous Once  . insulin aspart  0-5 Units Subcutaneous QHS  . insulin aspart  0-9 Units Subcutaneous TID WC  . mouth rinse  15 mL Mouth Rinse q12n4p  . metoprolol tartrate  25 mg Oral BID  . multivitamin with minerals  1 tablet Oral Daily  . nystatin   Topical BID  . pantoprazole  40 mg Oral BID  . QUEtiapine  50 mg Oral QHS  . RESOURCE THICKENUP CLEAR   Oral BID AC & HS   Continuous Infusions:   LOS: 6 days     Cordelia Poche, MD [facility] 09/26/2016, 1:31 PM Pager: [phone]  If 7PM-7AM, please contact night-coverage [email or web address] Password [email or web address] 09/26/2016, 1:31 PM

## 2016-09-26 NOTE — Progress Notes (Signed)
Placed patient on CPAP for the night with pressure set at 10cm and oxygen set at 5lpm.  

## 2016-09-27 ENCOUNTER — Inpatient Hospital Stay (HOSPITAL_COMMUNITY): Payer: Medicare Other

## 2016-09-27 LAB — CBC
HCT: 31.1 % — ABNORMAL LOW (ref 39.0–52.0)
Hemoglobin: 8.8 g/dL — ABNORMAL LOW (ref 13.0–17.0)
MCH: 25.5 pg — ABNORMAL LOW (ref 26.0–34.0)
MCHC: 28.3 g/dL — ABNORMAL LOW (ref 30.0–36.0)
MCV: 90.1 fL (ref 78.0–100.0)
PLATELETS: 146 10*3/uL — AB (ref 150–400)
RBC: 3.45 MIL/uL — ABNORMAL LOW (ref 4.22–5.81)
RDW: 18 % — AB (ref 11.5–15.5)
WBC: 9 10*3/uL (ref 4.0–10.5)

## 2016-09-27 LAB — BASIC METABOLIC PANEL
ANION GAP: 8 (ref 5–15)
BUN: 33 mg/dL — ABNORMAL HIGH (ref 6–20)
CALCIUM: 8.9 mg/dL (ref 8.9–10.3)
CO2: 34 mmol/L — ABNORMAL HIGH (ref 22–32)
Chloride: 99 mmol/L — ABNORMAL LOW (ref 101–111)
Creatinine, Ser: 1.02 mg/dL (ref 0.61–1.24)
Glucose, Bld: 169 mg/dL — ABNORMAL HIGH (ref 65–99)
Potassium: 3.5 mmol/L (ref 3.5–5.1)
Sodium: 141 mmol/L (ref 135–145)

## 2016-09-27 LAB — GLUCOSE, CAPILLARY
GLUCOSE-CAPILLARY: 216 mg/dL — AB (ref 65–99)
Glucose-Capillary: 181 mg/dL — ABNORMAL HIGH (ref 65–99)
Glucose-Capillary: 218 mg/dL — ABNORMAL HIGH (ref 65–99)
Glucose-Capillary: 158 mg/dL — ABNORMAL HIGH (ref 65–99)

## 2016-09-27 MED ORDER — FUROSEMIDE 10 MG/ML IJ SOLN
40.0000 mg | Freq: Two times a day (BID) | INTRAMUSCULAR | Status: DC
  Administered 2016-09-27 – 2016-09-29 (×5): 40 mg via INTRAVENOUS
  Filled 2016-09-27 (×6): qty 4

## 2016-09-27 MED ORDER — LORAZEPAM 2 MG/ML IJ SOLN
1.0000 mg | Freq: Once | INTRAMUSCULAR | Status: DC
  Filled 2016-09-27: qty 1

## 2016-09-27 MED ORDER — POLYETHYLENE GLYCOL 3350 17 G PO PACK
17.0000 g | PACK | Freq: Every day | ORAL | Status: DC
  Administered 2016-09-27 – 2016-09-30 (×4): 17 g via ORAL
  Filled 2016-09-27 (×4): qty 1

## 2016-09-27 NOTE — Progress Notes (Signed)
PROGRESS NOTE    [name].  CNO:[id number] DOB: Oct 22, 1934 DOA: 09/06/2016 PCP: [name], MD   Brief Narrative: Seth Abel. is a 82 y.o. male with history of COPD, chronic diastolic CHF, CAD status post CABG, chronic atrial fibrillation on Coumadin, intermittent agitation, and chronic wounds involving the left foot and sacrum. He presented with altered mental status secondary to accidental drug overdose with ativan, haldol and benadryl.   Assessment & Plan:   Principal Problem:   Overdose, accidental or unintentional, initial encounter Active Problems:   DM (diabetes mellitus) type II uncontrolled, periph vascular disorder ([name])   Obstructive sleep apnea   Essential hypertension   CAD (coronary artery disease)   Chronic atrial fibrillation ([name])   Pleural effusion, left   Anemia   Chronic respiratory failure with hypoxia ([name])   AKI (acute kidney injury) ([place])   Accidental overdose   Acute on chronic respiratory failure with hypoxia ([name])   Pressure injury of skin   Protein-calorie malnutrition, severe   Acute encephalopathy Secondary to accidental drug overdose. Improved.  Syncopal episode Event occurred on 4/3 while admitted. Code blue called but no ACLS required. CTA obtained and was significant for recurrent right pleural effusion.  Acute on chronic hypoxic respiratory failure Aspiration pneumonia Completed course of Unasyn. Persistent symptoms likely secondary to significant pleural effusion and likely atelectasis -O2 as needed, wean -diurese  Right pleural effusion Has had one thoracentesis on 3/31 and another on 4/5. Appears transudative -culture pending -cytology pending  Acute on chronic diastolic heart failure Previous TEE on 07/31/16 significant for EF of 55-60% with moderate concentric LVH and no wall motion abnormality. Dry weight of 79kg previously. Pleural effusions. -lasix 40mg  BID  AKI on CKD 3 Baseline creatinine of 1.1. 1.29 at  admission and trended up initially. Now trended down.  Chronic atrial fibrillation CHA2DS2-VASc Score is 4. Treated with warfarin as an outpatient. Held on admission secondary to high risk for bleeding.  Nutrition SLP and dietitian recommendations  COPD OSA Stable -CPAP  Chronic blood loss anemia Baseline is 8-9. Has a recent history of upper GI bleed in February 2018. Trended down recently. s/p 2 units of PRBC. Stable.  Diabetes mellitus, type 2 A1c of 7.4. -continue SSI  Multiple chronic wounds -wound care  Thrombocytopenia New. No evidence of bleeding or new rashes. -repeat CBC   DVT prophylaxis: SCDs Code Status: Full code Family Communication: Called daughter with no response   Disposition Plan: Discharge back to SNF when medically stable   Consultants:   Interventional radiology  Procedures:   Right thoracentesis (3/31)  Right thoracentesis (4/5)  Antimicrobials:  Unasyn    Subjective: No chest pain. No dyspnea. No leg swelling. Not asking for water today.  Objective: Vitals:   09/27/16 0300 09/27/16 0400 09/27/16 0800 09/27/16 1200  BP: (!) 107/49 (!) 107/49 (!) 107/49 (!) 109/45  Pulse: 61 (!) 59 61 63  Resp: (!) 23 (!) 32 19 (!) 27  Temp: 97.3 F (36.3 C)  97.5 F (36.4 C) 98 F (36.7 C)  TempSrc: Oral  Oral Oral  SpO2: 98% 100% 99% 96%  Weight: 73 kg (161 lb)     Height:        Intake/Output Summary (Last 24 hours) at 09/27/16 1522 Last data filed at 09/27/16 1300  Gross per 24 hour  Intake              840 ml  Output  1325 ml  Net             -485 ml   Filed Weights   09/25/16 0440 09/26/16 0346 09/27/16 0300  Weight: 77.1 kg (170 lb) 72.1 kg (159 lb) 73 kg (161 lb)    Examination:  General exam: Appears calm and comfortable Respiratory system: crackles on auscultation with diminished breath sounds bilaterally. Respiratory effort normal. Cardiovascular system: S1 & S2 heard, RRR. 2/6 systolic murmur heard best  at apex Gastrointestinal system: Abdomen is nondistended, soft and nontender. Normal bowel sounds heard. Central nervous system: Alert and oriented to person and place. No focal neurological deficits. Extremities: No edema. No calf tenderness Skin: No cyanosis. No rashes   Data Reviewed: I have personally reviewed following labs and imaging studies  CBC:  Recent Labs Lab 09/23/16 0504 09/23/16 1923 09/24/16 0235 09/25/16 0236 09/26/16 0740 09/27/16 0336  WBC 10.0  --  9.6 13.2* 9.9 9.0  HGB 6.8* 10.4* 9.2* 9.4* 10.0* 8.8*  HCT 24.3* 32.6* 30.1* 32.4* 33.4* 31.1*  MCV 87.7  --  86.2 88.3 89.1 90.1  PLT 184  --  184 184 124* 856*   Basic Metabolic Panel:  Recent Labs Lab 09/23/16 1923 09/23/16 2205 09/25/16 0236 09/26/16 0740 09/27/16 0336  NA 145 143 142 142 141  K 5.8* 3.6 3.3* 3.6 3.5  CL 101 101 100* 102 99*  CO2 30 33* 33* 34* 34*  GLUCOSE 165* 188* 154* 146* 169*  BUN 53* 53* 44* 36* 33*  CREATININE 1.62* 1.61* 1.46* 1.21 1.02  CALCIUM 5.4* 9.0 8.9 8.8* 8.9  MG 1.1* 2.2 2.0  --   --   PHOS 3.5  --   --   --   --    GFR: Estimated Creatinine Clearance: 60.6 mL/min (by C-G formula based on SCr of 1.02 mg/dL). Liver Function Tests:  Recent Labs Lab 09/21/16 0340 09/25/16 0236  AST 41  --   ALT 33  --   ALKPHOS 164*  --   BILITOT 0.8  --   PROT 5.6* 6.0*  ALBUMIN 1.9*  --    No results for input(s): LIPASE, AMYLASE in the last 168 hours. No results for input(s): AMMONIA in the last 168 hours. Coagulation Profile: No results for input(s): INR, PROTIME in the last 168 hours. Cardiac Enzymes: No results for input(s): CKTOTAL, CKMB, CKMBINDEX, TROPONINI in the last 168 hours. BNP (last 3 results) No results for input(s): PROBNP in the last 8760 hours. HbA1C: No results for input(s): HGBA1C in the last 72 hours. CBG:  Recent Labs Lab 09/26/16 1228 09/26/16 1703 09/26/16 2149 09/27/16 0924 09/27/16 1206  GLUCAP 118* 182* 204* 181* 216*    Lipid Profile: No results for input(s): CHOL, HDL, LDLCALC, TRIG, CHOLHDL, LDLDIRECT in the last 72 hours. Thyroid Function Tests: No results for input(s): TSH, T4TOTAL, FREET4, T3FREE, THYROIDAB in the last 72 hours. Anemia Panel: No results for input(s): VITAMINB12, FOLATE, FERRITIN, TIBC, IRON, RETICCTPCT in the last 72 hours. Sepsis Labs: No results for input(s): PROCALCITON, LATICACIDVEN in the last 168 hours.  Recent Results (from the past 240 hour(s))  Body fluid culture     Status: None   Collection Time: 09/20/16  9:46 AM  Result Value Ref Range Status   Specimen Description PLEURAL RIGHT  Final   Special Requests NONE  Final   Gram Stain   Final    RARE WBC PRESENT, PREDOMINANTLY MONONUCLEAR NO ORGANISMS SEEN    Culture NO GROWTH 3 DAYS  Final  Report Status 09/23/2016 FINAL  Final  MRSA PCR Screening     Status: None   Collection Time: 09/23/16  2:29 PM  Result Value Ref Range Status   MRSA by PCR NEGATIVE NEGATIVE Final    Comment:        The GeneXpert MRSA Assay (FDA approved for NASAL specimens only), is one component of a comprehensive MRSA colonization surveillance program. It is not intended to diagnose MRSA infection nor to guide or monitor treatment for MRSA infections.   Gram stain     Status: None   Collection Time: 09/25/16  9:47 AM  Result Value Ref Range Status   Specimen Description Pleural R  Final   Special Requests NONE  Final   Gram Stain   Final    WBC PRESENT,BOTH PMN AND MONONUCLEAR NO ORGANISMS SEEN CYTOSPIN SMEAR    Report Status 09/25/2016 FINAL  Final  Culture, body fluid-bottle     Status: None (Preliminary result)   Collection Time: 09/25/16  9:47 AM  Result Value Ref Range Status   Specimen Description PLEURAL RIGHT  Final   Special Requests BOTTLES DRAWN AEROBIC AND ANAEROBIC  Final   Culture NO GROWTH 1 DAY  Final   Report Status PENDING  Incomplete         Radiology Studies: Dg Chest Port 1 View  Result  Date: 09/27/2016 CLINICAL DATA:  Acute respiratory failure with hypoxia EXAM: PORTABLE CHEST 1 VIEW COMPARISON:  Chest radiograph from one day prior. FINDINGS: Stable configuration of aortic valve prosthesis, median sternotomy wires and single lead left subclavian pacemaker. Stable cardiomediastinal silhouette with mild cardiomegaly and aortic atherosclerosis. No pneumothorax. Small bilateral pleural effusions, slightly increased on the right. Severe diffuse fluffy parahilar and bibasilar lung opacities, not appreciably changed. IMPRESSION: 1. No appreciable change in severe diffuse fluffy parahilar and bibasilar lung opacities, favor severe pulmonary edema due to congestive heart failure given the cardiomegaly, with bibasilar atelectasis. 2. Small bilateral pleural effusions, slightly increased on the right. 3. Aortic atherosclerosis. Electronically Signed   By: Ilona Sorrel M.D.   On: 09/27/2016 10:01   Dg Chest Port 1 View  Result Date: 09/26/2016 CLINICAL DATA:  Respiratory failure.  Hypoxia. EXAM: PORTABLE CHEST 1 VIEW COMPARISON:  09/25/2016. FINDINGS: Cardiac pacer with lead tip in the right ventricle. Prior CABG and cardiac valve replacement. Cardiomegaly with diffuse bilateral pulmonary infiltrates and bilateral pleural effusions again noted consistent with CHF. No interim change. No pneumothorax. Distal right clavicle resection, no change from prior exam. IMPRESSION: Cardiac pacer noted in stable position. Prior CABG and cardiac valve replacement . Persistent changes of congestive heart failure with bilateral pulmonary edema and bilateral pleural effusions. No change from prior exam. Electronically Signed   By: Marcello Moores  Register   On: 09/26/2016 09:03   Dg Swallowing Func-speech Pathology  Result Date: 09/26/2016 Objective Swallowing Evaluation: Type of Study: MBS-Modified Barium Swallow Study Patient Details Name: Hendrix Yurkovich. MRN: [id number] Date of Birth: 10/19/1934 Today's Date: 09/26/2016 Time:  SLP Start Time (ACUTE ONLY): 0920-SLP Stop Time (ACUTE ONLY): 0936 SLP Time Calculation (min) (ACUTE ONLY): 16 min Past Medical History: Past Medical History: Diagnosis Date . Aortal stenosis 2006  biophrostetic . Arthritis  . Atrial fibrillation ([place])   onset after CABG--- coumadin management at [facility] clinic . Carotid artery disease ([place]) 09/27/2010  Carotid US 12/17: bilat ICA 40-59 >> 1 year FU . CHF (congestive heart failure) ([place])   abter CABG . Complete heart block ([place])  . Coronary artery  disease  . DM type 2 (diabetes mellitus, type 2) ([place])   used to see Dr Meredith Pel, now f/u by Dr Larose Kells . Erectile dysfunction  . Gangrene ([place])  . History of nephrolithiasis  . HTN (hypertension)  . Hyperlipidemia  . Memory loss  . Obesity  . OSA (obstructive sleep apnea)   not using CPAP . Pacemaker 2/07  VVI . PVD (peripheral vascular disease) ([name])   CT angio showed- vascular insuff, intestine and RAS bilaterally, andgiogram 02/2009 severe PVD medical management . S/P aortic valve replacement  . Sick sinus syndrome ([name])   [name] . Skin cancer   surgery (nose) 09-2010 Past Surgical History: Past Surgical History: Procedure Laterality Date . ABDOMINAL AORTAGRAM N/A 11/10/2012  Procedure: ABDOMINAL Maxcine Ham;  Surgeon: Serafina Mitchell, MD;  Location: [facility] CATH LAB;  Service: Cardiovascular;  Laterality: N/A; . AMPUTATION  01/2010  great toe . AMPUTATION Left 07/16/2016  Procedure: RECECTION OF LEFT GREAT TOE METATARSAL HEAD and sesamoid;  Surgeon: Waynetta Sandy, MD;  Location: [facility];  Service: Vascular;  Laterality: Left; . AMPUTATION Left 07/31/2016  Procedure: SECOND TOE AMPUTATION;  Surgeon: Serafina Mitchell, MD;  Location: [place] OR;  Service: Vascular;  Laterality: Left; . ANGIOPLASTY / STENTING FEMORAL  02/10/10  Left femoral . ANGIOPLASTY ILLIAC ARTERY Left 07/16/2016  Procedure: drug coated Balloon ANGIOPLASTY of left superficial femoral artry stent;  Surgeon: Waynetta Sandy, MD;  Location: [facility];   Service: Vascular;  Laterality: Left; . AORTIC VALVE REPLACEMENT    biophrostetic for Ao Stenosis 2006 . AORTIC VALVE REPLACEMENT  01/17/2011  S/P redo median sternotomy, extracorporeal circulation, redo Aortic Valve Replacement using a 23-mm Edwards pericardial Magna-Ease valve, Dr Ulla Gallo . AORTOGRAM N/A 07/16/2016  Procedure: left lower extremity angiogram;  Surgeon: Waynetta Sandy, MD;  Location: [facility];  Service: Vascular;  Laterality: N/A; . APPLICATION OF WOUND VAC Left 08/24/3543  Procedure: APPLICATION OF WOUND VAC;  Surgeon: Serafina Mitchell, MD;  Location: [facility];  Service: Vascular;  Laterality: Left; . CATARACT EXTRACTION, BILATERAL   . CORONARY ARTERY BYPASS GRAFT  2006 . EP IMPLANTABLE DEVICE N/A 06/06/2016  MDT Generator change with Advisa SR MRI device implanted by Dr Rayann Heman . ESOPHAGOGASTRODUODENOSCOPY (EGD) WITH PROPOFOL N/A 08/07/2016  Procedure: ESOPHAGOGASTRODUODENOSCOPY (EGD) WITH PROPOFOL;  Surgeon: Mauri Pole, MD;  Location: [facility];  Service: Endoscopy;  Laterality: N/A; . EYE SURGERY   . ILIAC ATHERECTOMY Left 07/16/2016  Procedure: Angioplasty and ATHERECTOMY of left anterior tibial artery;  Surgeon: Waynetta Sandy, MD;  Location: [facility];  Service: Vascular;  Laterality: Left; . INCISE AND DRAIN ABCESS Right May 2014  right heel  . IR THORACENTESIS ASP PLEURAL SPACE W/IMG GUIDE  09/25/2016 . LOWER EXTREMITY ANGIOGRAM Left 11/23/2012  Procedure: LOWER EXTREMITY ANGIOGRAM;  Surgeon: Serafina Mitchell, MD;  Location: [facility] CATH LAB;  Service: Cardiovascular;  Laterality: Left; . PACEMAKER PLACEMENT  07/2005  Medtronic Sigma SR implanted by Dr Olevia Perches . PERIPHERAL VASCULAR CATHETERIZATION N/A 07/10/2016  Procedure: Abdominal Aortogram;  Surgeon: Conrad [place], MD;  Location: [facility] CV LAB;  Service: Cardiovascular;  Laterality: N/A; . PERIPHERAL VASCULAR CATHETERIZATION Bilateral 07/10/2016  Procedure: Lower Extremity Angiography;  Surgeon: Conrad Fillmore, MD;  Location: [facility] CV LAB;  Service: Cardiovascular;  Laterality: Bilateral; . TONSILLECTOMY   . ULTRASOUND GUIDANCE FOR VASCULAR ACCESS Bilateral 07/16/2016  Procedure: ULTRASOUND GUIDANCE FOR VASCULAR ACCESS of right common femoral and left dorsalis pedis;  Surgeon: Waynetta Sandy, MD;  Location: [facility] OR;  Service: Vascular;  Laterality: Bilateral; . WOUND DEBRIDEMENT Left 07/31/2016  Procedure: DEBRIDEMENT GREAT TOE AMPUTATION SITE;  Surgeon: Serafina Mitchell, MD;  Location: [facility];  Service: Vascular;  Laterality: Left; HPI: 82 y.o.malewith history of COPD, chronic diastolic CHF, CAD status post CABG, chronic atrial fibrillation on Coumadin, intermittent agitation, and chronic wounds involving the left foot and sacrum who presentedto the EDfrom his SNF for evaluation of somnolence following an accidental overdose of Ativan, Haldol, and Benadryl. Patient was noted to be increasingly somnolent at the nursing facility and EMS was called for transport to the hospital. Per the report of his family at the bedside, he had begun to show good improvement at the nursing facility after being discharged from the hospital on 08/09/2016 after nonoperative management of right humerus fracture, treatment of bleeding ulcer with endoscopic clipping and 4 units of packed red blood cells, and drainage of a right pleural effusion.Per MD equires 2LO2 at baseline - acute decline likely due to respiratory suppression w/ OD of 2 sedating meds - wean O2 as able but presently still requiring FM oxygen, w/ rapid severe desaturation when he pulls off his mask - cont empiric abx for possible aspiration. Seen during prior admission 08/01/16, with findings of functional swallow with sufficient oral attention, adequate mastication. Regular solids, thin liquids, meds whole in puree recommended. CXR 09/21/16 Pleural fluid on the right is similar in amount. Presumed small left pleural effusion is stable, Persistent lung opacities are noted. Appearance  suggests a combination of pulmonary edema and basilar atelectasis. Pneumonia is possible. Pt has been npo x ice chips due to mental status and aspiration concerns.   Subjective: Pt verbally agitated, on venturi mask Assessment / Plan / Recommendation CHL IP CLINICAL IMPRESSIONS 09/26/2016 Clinical Impression Mr. Hoeffner demonstrates improved alertness, however continues to require verbal cues to slow rate due to impulsivity. He presents with a mild pharyngeal dysphagia characterized by minimal pyriform sinus residue and mild vallecular residue with each tested consistency, partially due to the curvature of his epiglottis, although he was able to reduce quantity of residue when effort was put forth during the swallow. One instance of deep penetration with consecutive large sips of thin liquids was ultimately cleared with a second swallow. Overall, Mr. Giangrande oropharyngeal swallowing mechanism was adequate and he was able to achieve airway protection during this study. Educated pt re: results of MBS and safe swallow precautions with an emphasis on an upright position during intake of POs and the importance of small sips/slow rate. Recommend regular solids, thin liquids (straws ok), meds whole in puree. ST will follow up briefly to assess diet tolerance and for continued education.  SLP Visit Diagnosis Dysphagia, unspecified (R13.10) Attention and concentration deficit following -- Frontal lobe and executive function deficit following -- Impact on safety and function Mild aspiration risk   CHL IP TREATMENT RECOMMENDATION 09/26/2016 Treatment Recommendations Therapy as outlined in treatment plan below   Prognosis 09/26/2016 Prognosis for Safe Diet Advancement Good Barriers to Reach Goals -- Barriers/Prognosis Comment -- CHL IP DIET RECOMMENDATION 09/26/2016 SLP Diet Recommendations Regular solids;Thin liquid Liquid Administration via Cup;Straw Medication Administration Whole meds with puree Compensations Slow rate;Small  sips/bites;Minimize environmental distractions Postural Changes Seated upright at 90 degrees   CHL IP OTHER RECOMMENDATIONS 09/26/2016 Recommended Consults -- Oral Care Recommendations Oral care BID Other Recommendations --   CHL IP FOLLOW UP RECOMMENDATIONS 09/26/2016 Follow up Recommendations Skilled Nursing facility   [facility] IP FREQUENCY AND DURATION 09/26/2016 Speech Therapy  Frequency (ACUTE ONLY) min 2x/week Treatment Duration 1 week      CHL IP ORAL PHASE 09/26/2016 Oral Phase WFL Oral - Pudding Teaspoon -- Oral - Pudding Cup -- Oral - Honey Teaspoon -- Oral - Honey Cup -- Oral - Nectar Teaspoon -- Oral - Nectar Cup -- Oral - Nectar Straw -- Oral - Thin Teaspoon -- Oral - Thin Cup -- Oral - Thin Straw -- Oral - Puree -- Oral - Mech Soft -- Oral - Regular -- Oral - Multi-Consistency -- Oral - Pill -- Oral Phase - Comment --  CHL IP PHARYNGEAL PHASE 09/26/2016 Pharyngeal Phase Impaired Pharyngeal- Pudding Teaspoon -- Pharyngeal -- Pharyngeal- Pudding Cup -- Pharyngeal -- Pharyngeal- Honey Teaspoon -- Pharyngeal -- Pharyngeal- Honey Cup -- Pharyngeal -- Pharyngeal- Nectar Teaspoon -- Pharyngeal -- Pharyngeal- Nectar Cup -- Pharyngeal -- Pharyngeal- Nectar Straw -- Pharyngeal -- Pharyngeal- Thin Teaspoon -- Pharyngeal -- Pharyngeal- Thin Cup Pharyngeal residue - valleculae;Delayed swallow initiation-pyriform sinuses;Penetration/Aspiration during swallow;Reduced epiglottic inversion Pharyngeal Material enters airway, passes BELOW cords then ejected out Pharyngeal- Thin Straw Pharyngeal residue - valleculae;Pharyngeal residue - pyriform;Reduced epiglottic inversion Pharyngeal -- Pharyngeal- Puree Pharyngeal residue - valleculae;Reduced epiglottic inversion Pharyngeal -- Pharyngeal- Mechanical Soft -- Pharyngeal -- Pharyngeal- Regular Pharyngeal residue - valleculae;Reduced epiglottic inversion Pharyngeal -- Pharyngeal- Multi-consistency -- Pharyngeal -- Pharyngeal- Pill -- Pharyngeal -- Pharyngeal Comment --  CHL IP CERVICAL  ESOPHAGEAL PHASE 09/26/2016 Cervical Esophageal Phase WFL Pudding Teaspoon -- Pudding Cup -- Honey Teaspoon -- Honey Cup -- Nectar Teaspoon -- Nectar Cup -- Nectar Straw -- Thin Teaspoon -- Thin Cup -- Thin Straw -- Puree -- Mechanical Soft -- Regular -- Multi-consistency -- Pill -- Cervical Esophageal Comment -- No flowsheet data found. Completed by Fransisca Kaufmann, SLP Student Supervised and reviewed by [name] MA CCC-SLP [place], Katherene Ponto 09/26/2016, 11:05 AM                   Scheduled Meds: . chlorhexidine  15 mL Mouth Rinse BID  . collagenase   Topical Daily  . donepezil  10 mg Oral QHS  . furosemide  40 mg Intravenous BID  . insulin aspart  0-5 Units Subcutaneous QHS  . insulin aspart  0-9 Units Subcutaneous TID WC  . LORazepam  1 mg Intravenous Once  . mouth rinse  15 mL Mouth Rinse q12n4p  . metoprolol tartrate  25 mg Oral BID  . multivitamin with minerals  1 tablet Oral Daily  . nystatin   Topical BID  . pantoprazole  40 mg Oral BID  . polyethylene glycol  17 g Oral Daily  . QUEtiapine  50 mg Oral QHS  . RESOURCE THICKENUP CLEAR   Oral BID AC & HS   Continuous Infusions:   LOS: 7 days     Cordelia Poche, MD [facility] 09/27/2016, 3:22 PM Pager: [phone]  If 7PM-7AM, please contact night-coverage [email or web address] Password [email or web address] 09/27/2016, 3:22 PM

## 2016-09-28 ENCOUNTER — Inpatient Hospital Stay (HOSPITAL_COMMUNITY): Payer: Medicare Other

## 2016-09-28 DIAGNOSIS — T50901A Poisoning by unspecified drugs, medicaments and biological substances, accidental (unintentional), initial encounter: Secondary | ICD-10-CM

## 2016-09-28 LAB — COMPREHENSIVE METABOLIC PANEL
ALK PHOS: 284 U/L — AB (ref 38–126)
ALT: 59 U/L (ref 17–63)
ANION GAP: 9 (ref 5–15)
AST: 64 U/L — ABNORMAL HIGH (ref 15–41)
Albumin: 1.8 g/dL — ABNORMAL LOW (ref 3.5–5.0)
BUN: 28 mg/dL — ABNORMAL HIGH (ref 6–20)
CALCIUM: 9.2 mg/dL (ref 8.9–10.3)
CO2: 36 mmol/L — AB (ref 22–32)
Chloride: 98 mmol/L — ABNORMAL LOW (ref 101–111)
Creatinine, Ser: 1.11 mg/dL (ref 0.61–1.24)
GFR calc non Af Amer: >60 mL/min (ref >60)
Glucose, Bld: 238 mg/dL — ABNORMAL HIGH (ref 65–99)
Potassium: 3.6 mmol/L (ref 3.5–5.1)
SODIUM: 143 mmol/L (ref 135–145)
TOTAL PROTEIN: 6.5 g/dL (ref 6.5–8.1)
Total Bilirubin: 0.5 mg/dL (ref 0.3–1.2)

## 2016-09-28 LAB — CBC
HCT: 34.2 % — ABNORMAL LOW (ref 39.0–52.0)
HCT: 31.3 % — ABNORMAL LOW (ref 39.0–52.0)
HEMOGLOBIN: 9.6 g/dL — AB (ref 13.0–17.0)
Hemoglobin: 8.8 g/dL — ABNORMAL LOW (ref 13.0–17.0)
MCH: 26.2 pg (ref 26.0–34.0)
MCH: 25.4 pg — AB (ref 26.0–34.0)
MCHC: 28.1 g/dL — AB (ref 30.0–36.0)
MCHC: 28.1 g/dL — AB (ref 30.0–36.0)
MCV: 93.4 fL (ref 78.0–100.0)
MCV: 90.5 fL (ref 78.0–100.0)
PLATELETS: 188 10*3/uL (ref 150–400)
PLATELETS: 164 10*3/uL (ref 150–400)
RBC: 3.66 MIL/uL — ABNORMAL LOW (ref 4.22–5.81)
RBC: 3.46 MIL/uL — ABNORMAL LOW (ref 4.22–5.81)
RDW: 17.8 % — AB (ref 11.5–15.5)
RDW: 17.4 % — ABNORMAL HIGH (ref 11.5–15.5)
WBC: 9.2 10*3/uL (ref 4.0–10.5)
WBC: 10.3 10*3/uL (ref 4.0–10.5)

## 2016-09-28 LAB — GLUCOSE, CAPILLARY
GLUCOSE-CAPILLARY: 190 mg/dL — AB (ref 65–99)
GLUCOSE-CAPILLARY: 131 mg/dL — AB (ref 65–99)
Glucose-Capillary: 125 mg/dL — ABNORMAL HIGH (ref 65–99)
Glucose-Capillary: 145 mg/dL — ABNORMAL HIGH (ref 65–99)
Glucose-Capillary: 149 mg/dL — ABNORMAL HIGH (ref 65–99)
Glucose-Capillary: 265 mg/dL — ABNORMAL HIGH (ref 65–99)

## 2016-09-28 LAB — BLOOD GAS, ARTERIAL
Acid-Base Excess: 8 mmol/L — ABNORMAL HIGH (ref 0.0–2.0)
BICARBONATE: 33.9 mmol/L — AB (ref 20.0–28.0)
Drawn by: 252031
FIO2: 100
LHR: 16 {breaths}/min
O2 Saturation: 100 %
PATIENT TEMPERATURE: 98.6
PEEP: 5 cmH2O
VT: 580 mL
pCO2 arterial: 66.6 mmHg (ref 32.0–48.0)
pH, Arterial: 7.327 — ABNORMAL LOW (ref 7.350–7.450)
pO2, Arterial: 269 mmHg — ABNORMAL HIGH (ref 83.0–108.0)

## 2016-09-28 LAB — BASIC METABOLIC PANEL
Anion gap: 8 (ref 5–15)
BUN: 26 mg/dL — AB (ref 6–20)
CHLORIDE: 99 mmol/L — AB (ref 101–111)
CO2: 37 mmol/L — AB (ref 22–32)
CREATININE: 0.98 mg/dL (ref 0.61–1.24)
Calcium: 9.1 mg/dL (ref 8.9–10.3)
GFR calc non Af Amer: >60 mL/min (ref >60)
Glucose, Bld: 195 mg/dL — ABNORMAL HIGH (ref 65–99)
Potassium: 3.5 mmol/L (ref 3.5–5.1)
Sodium: 144 mmol/L (ref 135–145)

## 2016-09-28 LAB — MAGNESIUM: Magnesium: 1.7 mg/dL (ref 1.7–2.4)

## 2016-09-28 LAB — PHOSPHORUS: Phosphorus: 1.9 mg/dL — ABNORMAL LOW (ref 2.5–4.6)

## 2016-09-28 LAB — TROPONIN I: Troponin I: 0.09 ng/mL (ref <0.03)

## 2016-09-28 MED ORDER — FENTANYL CITRATE (PF) 100 MCG/2ML IJ SOLN
50.0000 ug | INTRAMUSCULAR | Status: DC | PRN
  Administered 2016-09-28 (×2): 50 ug via INTRAVENOUS
  Filled 2016-09-28 (×2): qty 2

## 2016-09-28 MED ORDER — FENTANYL 2500MCG IN NS 250ML (10MCG/ML) PREMIX INFUSION
25.0000 ug/h | INTRAVENOUS | Status: DC
  Administered 2016-09-28: 50 ug/h via INTRAVENOUS
  Administered 2016-09-29: 75 ug/h via INTRAVENOUS
  Filled 2016-09-28 (×2): qty 250

## 2016-09-28 MED ORDER — FENTANYL CITRATE (PF) 100 MCG/2ML IJ SOLN: 50.0000 ug | INTRAMUSCULAR | Status: DC | PRN

## 2016-09-28 MED ORDER — QUETIAPINE FUMARATE 50 MG PO TABS
50.0000 mg | ORAL_TABLET | Freq: Every day | ORAL | Status: DC
  Administered 2016-09-28 – 2016-09-30 (×2): 50 mg
  Filled 2016-09-28 (×2): qty 1

## 2016-09-28 MED ORDER — FENTANYL BOLUS VIA INFUSION
25.0000 ug | INTRAVENOUS | Status: DC | PRN
  Administered 2016-09-29: 25 ug via INTRAVENOUS
  Filled 2016-09-28: qty 25

## 2016-09-28 MED ORDER — CHLORHEXIDINE GLUCONATE 0.12% ORAL RINSE (MEDLINE KIT)
15.0000 mL | Freq: Two times a day (BID) | OROMUCOSAL | Status: DC
  Administered 2016-09-28 – 2016-10-02 (×8): 15 mL via OROMUCOSAL

## 2016-09-28 MED ORDER — SODIUM CHLORIDE 0.9% FLUSH
10.0000 mL | Freq: Two times a day (BID) | INTRAVENOUS | Status: DC
  Administered 2016-09-28 – 2016-10-03 (×10): 10 mL

## 2016-09-28 MED ORDER — FENTANYL CITRATE (PF) 100 MCG/2ML IJ SOLN: 50.0000 ug | Freq: Once | INTRAMUSCULAR | Status: AC

## 2016-09-28 MED ORDER — PANTOPRAZOLE SODIUM 40 MG IV SOLR
40.0000 mg | INTRAVENOUS | Status: DC
  Administered 2016-09-28 – 2016-09-30 (×3): 40 mg via INTRAVENOUS
  Filled 2016-09-28 (×4): qty 40

## 2016-09-28 MED ORDER — DONEPEZIL HCL 10 MG PO TABS
10.0000 mg | ORAL_TABLET | Freq: Every day | ORAL | Status: DC
  Administered 2016-09-28 – 2016-09-30 (×2): 10 mg
  Filled 2016-09-28 (×2): qty 1

## 2016-09-28 MED ORDER — INSULIN ASPART 100 UNIT/ML ~~LOC~~ SOLN
0.0000 [IU] | SUBCUTANEOUS | Status: DC
  Administered 2016-09-28 (×3): 1 [IU] via SUBCUTANEOUS
  Administered 2016-09-28: 5 [IU] via SUBCUTANEOUS
  Administered 2016-09-28: 1 [IU] via SUBCUTANEOUS
  Administered 2016-09-28: 2 [IU] via SUBCUTANEOUS
  Administered 2016-09-29 – 2016-09-30 (×3): 1 [IU] via SUBCUTANEOUS

## 2016-09-28 MED ORDER — MIDAZOLAM HCL 2 MG/2ML IJ SOLN: 1.0000 mg | INTRAMUSCULAR | Status: DC | PRN

## 2016-09-28 MED ORDER — FENTANYL 2500MCG IN NS 250ML (10MCG/ML) PREMIX INFUSION: 25.0000 ug/h | INTRAVENOUS | Status: DC

## 2016-09-28 MED ORDER — METOPROLOL TARTRATE 25 MG/10 ML ORAL SUSPENSION
25.0000 mg | Freq: Two times a day (BID) | ORAL | Status: DC
  Administered 2016-09-28 – 2016-09-29 (×2): 25 mg
  Filled 2016-09-28 (×4): qty 10

## 2016-09-28 MED ORDER — MIDAZOLAM HCL 2 MG/2ML IJ SOLN
1.0000 mg | Freq: Once | INTRAMUSCULAR | Status: AC | PRN
  Administered 2016-09-28: 1 mg via INTRAVENOUS
  Filled 2016-09-28: qty 2

## 2016-09-28 MED ORDER — MIDAZOLAM HCL 2 MG/2ML IJ SOLN
1.0000 mg | INTRAMUSCULAR | Status: DC | PRN
  Administered 2016-09-28: 2 mg via INTRAVENOUS
  Filled 2016-09-28: qty 2

## 2016-09-28 MED ORDER — SODIUM CHLORIDE 0.9 % IV SOLN: 250.0000 mL | INTRAVENOUS | Status: DC | PRN

## 2016-09-28 MED ORDER — ADULT MULTIVITAMIN LIQUID CH
15.0000 mL | Freq: Every day | ORAL | Status: DC
  Administered 2016-09-29 – 2016-09-30 (×2): 15 mL
  Filled 2016-09-28 (×2): qty 15

## 2016-09-28 MED ORDER — CHLORHEXIDINE GLUCONATE CLOTH 2 % EX PADS
6.0000 | MEDICATED_PAD | Freq: Every day | CUTANEOUS | Status: DC
  Administered 2016-09-28 – 2016-10-03 (×6): 6 via TOPICAL

## 2016-09-28 MED ORDER — SODIUM CHLORIDE 0.9% FLUSH: 10.0000 mL | INTRAVENOUS | Status: DC | PRN

## 2016-09-28 MED ORDER — ORAL CARE MOUTH RINSE
15.0000 mL | Freq: Four times a day (QID) | OROMUCOSAL | Status: DC
  Administered 2016-09-28 – 2016-10-02 (×15): 15 mL via OROMUCOSAL

## 2016-09-28 NOTE — Progress Notes (Signed)
Peripherally Inserted Central Catheter/Midline Placement  The IV Nurse has discussed with the patient and/or persons authorized to consent for the patient, the purpose of this procedure and the potential benefits and risks involved with this procedure.  The benefits include less needle sticks, lab draws from the catheter, and the patient may be discharged home with the catheter. Risks include, but not limited to, infection, bleeding, blood clot (thrombus formation), and puncture of an artery; nerve damage and irregular heartbeat and possibility to perform a PICC exchange if needed/ordered by physician.  Alternatives to this procedure were also discussed.  Bard Power PICC patient education guide, fact sheet on infection prevention and patient information card has been provided to patient /or left at bedside.  Spoke with wife via telephone to obtain consent.  All questions answered.  PICC/Midline Placement Documentation  PICC Double Lumen 60/73/71 PICC Right Basilic 42 cm 0 cm (Active)  Indication for Insertion or Continuance of Line Prolonged intravenous therapies;Limited venous access - need for IV therapy >5 days (PICC only);Poor Vasculature-patient has had multiple peripheral attempts or PIVs lasting less than 24 hours 09/28/2016  3:01 PM  Exposed Catheter (cm) 0 cm 09/28/2016  3:01 PM  Site Assessment Clean;Dry;Intact 09/28/2016  3:01 PM  Lumen #1 Status Flushed;Saline locked;Blood return noted 09/28/2016  3:01 PM  Lumen #2 Status Flushed;Saline locked;Blood return noted 09/28/2016  3:01 PM  Dressing Type Transparent 09/28/2016  3:01 PM  Dressing Status Clean;Dry;Intact;Antimicrobial disc in place 09/28/2016  3:01 PM  Line Care Connections checked and tightened 09/28/2016  3:01 PM  Line Adjustment (NICU/IV Team Only) No 09/28/2016  3:01 PM  Dressing Intervention New dressing 09/28/2016  3:01 PM  Dressing Change Due 10/05/16 09/28/2016  3:01 PM       Rolena Infante 09/28/2016, 3:01 PM

## 2016-09-28 NOTE — Procedures (Signed)
Intubation Procedure Note Zakkery Dorian [id number] [birth year]  Procedure: Intubation Indications: Respiratory insufficiency  Procedure Details Consent: Unable to obtain consent because of altered level of consciousness. Time Out: Verified patient identification, verified procedure, site/side was marked, verified correct patient position, special equipment/implants available, medications/allergies/relevent history reviewed, required imaging and test results available.  Performed  Maximum sterile technique was used including gloves and hand hygiene.  MAC and 4    Evaluation Hemodynamic Status: BP stable throughout; O2 sats: currently acceptable Patient's Current Condition: stable Complications: No apparent complications Patient did tolerate procedure well. Chest X-ray ordered to verify placement.  CXR: pending.   Lacretia Nicks 09/28/2016

## 2016-09-28 NOTE — Progress Notes (Signed)
Central monitoring called to report patients sats were in the 60's. Patient with venti mask on with O2 at 15L. Patient found to be in respiratory failure. Patient was unresponsive to verbal stimuli. Pupils were pinpoint and equal. Code blue was called. Wife in room with patient. Wes came to room with respiratory and we started bagging patient. Code cart in room and leads applied to patient. Patient never lost heart rate thru out respiratory failure episode. HR sustained 70's. Pacer in left upper chest. Blood pressure was supported with NS wide open. Anesthesia in room and intubated patient, no sedation required. Report called and patient transported to 23M room 6.

## 2016-09-28 NOTE — Consult Note (Addendum)
PULMONARY / CRITICAL CARE MEDICINE   Name: [name]. MRN: [id number] DOB: 06-03-1935    ADMISSION DATE:  09/17/2016 CONSULTATION DATE:  09/28/16  REFERRING MD:  Dr. Harvie Bridge  CHIEF COMPLAINT:  Respiratory failure  HISTORY OF PRESENT ILLNESS:   [name]. is a 82 y.o. male COPD, acute on chronic HFpEF, CAD status post CABG, aortic valve replacement and pacemaker placement, chronic atrial fibrillation on Coumadin who initially presented on 09/04/2016 with altered mentation in the setting of accidental medication overdose (ativan, haldol and benadryl). His mental status gradually improved through the past week. CXR showed bilateral pleural effusions and signs of pulmonary edema. He underwent thoracentesis 4/5 morning with removal of 1.4L of transudative fluid. For the past couple of days he has been getting furosemide therapy. This morning, he was hypoxic on monitor with altered mental status and Code Blue was called for intubation. After arrival in MICU, patient was responsive and moving all extremities while intubated.  PAST MEDICAL HISTORY :  He  has a past medical history of Aortal stenosis (2006); Arthritis; Atrial fibrillation ([place]); Carotid artery disease ([place]) (09/27/2010); CHF (congestive heart failure) ([place]); Complete heart block ([place]); Coronary artery disease; DM type 2 (diabetes mellitus, type 2) ([place]); Erectile dysfunction; Gangrene ([place]); History of nephrolithiasis; HTN (hypertension); Hyperlipidemia; Memory loss; Obesity; OSA (obstructive sleep apnea); Pacemaker (2/07); PVD (peripheral vascular disease) ([place]); S/P aortic valve replacement; Sick sinus syndrome ([place]); and Skin cancer.  PAST SURGICAL HISTORY: He  has a past surgical history that includes Aortic valve replacement; Coronary artery bypass graft (2006); Tonsillectomy; Cataract extraction, bilateral; Amputation (01/2010); pacemaker placement (07/2005); Aortic valve replacement (01/17/2011); Angioplasty /  stenting femoral (02/10/10); Incise and drain abcess (Right, May 2014); Eye surgery; abdominal aortagram (N/A, 11/10/2012); lower extremity angiogram (Left, 11/23/2012); Cardiac catheterization (N/A, 06/06/2016); Cardiac catheterization (N/A, 07/10/2016); Cardiac catheterization (Bilateral, 07/10/2016); Aortogram (N/A, 07/16/2016); Amputation (Left, 07/16/2016); Iliac atherectomy (Left, 07/16/2016); Angioplasty illiac artery (Left, 07/16/2016); Ultrasound guidance for vascular access (Bilateral, 07/16/2016); Wound debridement (Left, 07/31/2016); Amputation (Left, 07/31/2016); Application if wound vac (Left, 07/31/2016); Esophagogastroduodenoscopy (egd) with propofol (N/A, 08/07/2016); and IR THORACENTESIS ASP PLEURAL SPACE W/IMG GUIDE (09/25/2016).  Allergies  Allergen Reactions  . Potassium-Containing Compounds Anaphylaxis    IV--loss of conciousness  . Metoprolol Succinate Other (See Comments)    Extremely tired    No current facility-administered medications on file prior to encounter.    Current Outpatient Prescriptions on File Prior to Encounter  Medication Sig  . acetaminophen (TYLENOL) 325 MG tablet Take 650 mg by mouth 3 (three) times daily. 6AM, 2PM, 10PM  . albuterol (PROVENTIL) (2.5 MG/3ML) 0.083% nebulizer solution Take 3 mLs (2.5 mg total) by nebulization every 4 (four) hours as needed for wheezing or shortness of breath.  Marland Kitchen ampicillin (PRINCIPEN) 500 MG capsule Take 500 mg by mouth every 6 (six) hours. 10 day course ordered 09/12/16 for wound infection in left foot  . aspirin EC 81 MG EC tablet Take 1 tablet (81 mg total) by mouth daily.  . diphenhydrAMINE (BENADRYL) 25 MG tablet Take 25 mg by mouth See admin instructions. Take 1 tablet (25 mg) by mouth every 8 hours for allergic reaction - order date 09/10/16  . docusate sodium (COLACE) 100 MG capsule Take 1 capsule (100 mg total) by mouth 2 (two) times daily.  Marland Kitchen donepezil (ARICEPT) 10 MG tablet Take 1 tablet (10 mg total) by mouth at bedtime.  Marland Kitchen  ezetimibe-simvastatin (VYTORIN) 10-40 MG per tablet Take 1 tablet by mouth at  bedtime.  . ferrous sulfate 325 (65 FE) MG tablet Take 1 tablet (325 mg total) by mouth 2 (two) times daily with a meal.  . furosemide (LASIX) 40 MG tablet Take 1 tablet (40 mg total) by mouth 2 (two) times daily.  . insulin glargine (LANTUS) 100 UNIT/ML injection Inject 0.05 mLs (5 Units total) into the skin at bedtime.  . insulin lispro (HUMALOG) 100 UNIT/ML injection Inject 0-9 Units into the skin See admin instructions. Inject 1-9 unit subcutaneously three time daily with meals per sliding scale: CBG <70 initiate hypoglycemic protocol, 70-120 0 units, 121-150 1 unit, 151-200 2 units, 201-250 3 units, 251-300 5 units, 301-350 7 units, 351-400 9 units, >400 Call MD  . ipratropium (ATROVENT) 0.02 % nebulizer solution Take 2.5 mLs (0.5 mg total) by nebulization every 4 (four) hours as needed for wheezing or shortness of breath.  . metoprolol tartrate (LOPRESSOR) 25 MG tablet Take 1 tablet (25 mg total) by mouth 2 (two) times daily.  . Multiple Vitamins-Minerals (DECUBI-VITE) CAPS Take 1 capsule by mouth daily.  Marland Kitchen nystatin (MYCOSTATIN/NYSTOP) powder Apply topically See admin instructions. Apply topically to groin three times daily for irritation  . pantoprazole (PROTONIX) 40 MG tablet Take 1 tablet (40 mg total) by mouth 2 (two) times daily.  . potassium chloride SA (K-DUR,KLOR-CON) 20 MEQ tablet Take 20 mEq by mouth 2 (two) times daily.  . QUEtiapine (SEROQUEL) 50 MG tablet Take 50 mg by mouth at bedtime.    FAMILY HISTORY:  His indicated that his mother is deceased. He indicated that his father is deceased. He indicated that two of his four sisters are alive. He indicated that the status of his neg hx is unknown.    SOCIAL HISTORY: He  reports that he quit smoking about 28 years ago. His smoking use included Cigarettes. He has quit using smokeless tobacco. He reports that he does not drink alcohol or use  drugs.  REVIEW OF SYSTEMS:   Unable to be obtained in intubated patient   VITAL SIGNS: BP 129/80 (BP Location: Right Arm)   Pulse (!) 59   Temp 97.9 F (36.6 C) (Oral)   Resp (!) 34   Ht 5\' 10"  (1.778 m)   Wt 73 kg (161 lb)   SpO2 95%   BMI 23.10 kg/m   HEMODYNAMICS:    VENTILATOR SETTINGS: Vent Mode: PRVC FiO2 (%):  [100 %] 100 % Set Rate:  [16 bmp] 16 bmp Vt Set:  [580 mL] 580 mL PEEP:  [5 cmH20] 5 cmH20 Plateau Pressure:  [20 cmH20] 20 cmH20  INTAKE / OUTPUT: I/O last 3 completed shifts: In: 1080 [P.O.:1080] Out: 2525 [Urine:2525]  PHYSICAL EXAMINATION: General:  Alert, unable to assess orientation, intubated Neuro:  Moves all extremities with normal strength, PERRL HEENT:  MMM, ETT in place Cardiovascular:  Normal rate and rhythm, no murmurs rubs or gallops Lungs:  Coarse crackles at bases, symmetric chest excursion Abdomen:  Soft, slightly tender to palpation, no rebound guarding or distention Musculoskeletal:  Aputated left great toe Skin:  Multiple ecchymoses on arms  LABS:  BMET  Recent Labs Lab 09/25/16 0236 09/26/16 0740 09/27/16 0336  NA 142 142 141  K 3.3* 3.6 3.5  CL 100* 102 99*  CO2 33* 34* 34*  BUN 44* 36* 33*  CREATININE 1.46* 1.21 1.02  GLUCOSE 154* 146* 169*    Electrolytes  Recent Labs Lab 09/23/16 1923 09/23/16 2205 09/25/16 0236 09/26/16 0740 09/27/16 0336  CALCIUM 5.4* 9.0 8.9 8.8* 8.9  MG 1.1* 2.2 2.0  --   --   PHOS 3.5  --   --   --   --     CBC  Recent Labs Lab 09/26/16 0740 09/27/16 0336 09/28/16 0140  WBC 9.9 9.0 9.2  HGB 10.0* 8.8* 9.6*  HCT 33.4* 31.1* 34.2*  PLT 124* 146* 188    Coag's No results for input(s): APTT, INR in the last 168 hours.  Sepsis Markers No results for input(s): LATICACIDVEN, PROCALCITON, O2SATVEN in the last 168 hours.  ABG  Recent Labs Lab 09/23/16 1206 09/28/16 0140  PHART 7.438 7.327*  PCO2ART 52.5* 66.6*  PO2ART 134* 269*    Liver Enzymes  Recent  Labs Lab 09/21/16 0340  AST 41  ALT 33  ALKPHOS 164*  BILITOT 0.8  ALBUMIN 1.9*    Cardiac Enzymes No results for input(s): TROPONINI, PROBNP in the last 168 hours.  Glucose  Recent Labs Lab 09/26/16 2149 09/27/16 0924 09/27/16 1206 09/27/16 1645 09/27/16 2153 09/28/16 0226  GLUCAP 204* 181* 216* 218* 158* 265*    Imaging Dg Chest Port 1 View  Result Date: 09/28/2016 CLINICAL DATA:  Respiratory failure EXAM: PORTABLE CHEST 1 VIEW COMPARISON:  09/27/2016 FINDINGS: Endotracheal tube tip is 3.4 cm above the carina. Low lung volumes with small rightand tiny left pleural effusions. Diffuse airspace opacities are again noted likely reflective of pulmonary edema. Superimposed pneumonia would be difficult to entirely exclude. Left-sided pacemaker apparatus with right ventricular lead is identified. Cardiac monitoring and external defibrillator devices are noted overlying the chest. Aortic valvular replacement with median sternotomy is noted. No acute osseous appearing abnormality. IMPRESSION: Endotracheal tube tip is 3.4 cm above the carina in satisfactory position. Right effusion with diffuse airspace opacities consistent with CHF. Superimposed pneumonia would be difficult to entirely exclude. Electronically Signed   By: Ashley Royalty M.D.   On: 09/28/2016 01:50   Dg Chest Port 1 View  Result Date: 09/27/2016 CLINICAL DATA:  Acute respiratory failure with hypoxia EXAM: PORTABLE CHEST 1 VIEW COMPARISON:  Chest radiograph from one day prior. FINDINGS: Stable configuration of aortic valve prosthesis, median sternotomy wires and single lead left subclavian pacemaker. Stable cardiomediastinal silhouette with mild cardiomegaly and aortic atherosclerosis. No pneumothorax. Small bilateral pleural effusions, slightly increased on the right. Severe diffuse fluffy parahilar and bibasilar lung opacities, not appreciably changed. IMPRESSION: 1. No appreciable change in severe diffuse fluffy parahilar and  bibasilar lung opacities, favor severe pulmonary edema due to congestive heart failure given the cardiomegaly, with bibasilar atelectasis. 2. Small bilateral pleural effusions, slightly increased on the right. 3. Aortic atherosclerosis. Electronically Signed   By: Ilona Sorrel M.D.   On: 09/27/2016 10:01   DISCUSSION: is a 82 y.o. male COPD, acute on chronic HFpEF, CAD status post CABG, aortic valve replacement and pacemaker placement, chronic atrial fibrillation on Coumadin who initially presented on 09/01/2016 with altered mentation in the setting of accidental medication overdose (ativan, haldol and benadryl). Cause of his altered mental status is unknown. He has a non-focal neurologic exam currently but cannot rule out TIA. Hypoxia may have been from decreased respiratory drive vs worsening of underlying lung disease, such as with reaccumulation of pleural effusion or pulmonary edema.   ASSESSMENT / PLAN:  PULMONARY A: Acute hypoxic respiratory failure with pulmonary edema and pleural effusions P:   Will continue mechanical ventilation VAP bundle and lung protective ventilation  CARDIOVASCULAR A:  HFpEF, s/p aortic valve replacement, s/p CABG Chronic atrial fibrillation P:  Continued IV diuresis for  fluid overload Continue anticoagulation  RENAL A:   CKD P:   Tolerating diuresis with improving creatinine  GASTROINTESTINAL A:   Mild abdominal pain P:   Will monitor OG tube  HEMATOLOGIC A:   Systemic anticoagulation, held due to high bleeding risk P:  Will reassess  INFECTIOUS A:   No issues Low suspicion for pulmonary infection P:   Continue to monitor  ENDOCRINE A:   Diabetes mellitus P:   SSI   NEUROLOGIC A:   Acute encephalopathy in setting of hypoxemia, nonfocal exam. Consideration of TIA P:   STAT CT Head ordered Fentanyl, midazolam PRN while intubated to use for RASS goal 0  FAMILY  -  : Wife updated at bedside  - Inter-disciplinary family meet or  Palliative Care meeting due by:  10/05/16  60 mins critical care time spent  Beverely Low MD Pulmonary and [place] Pager: [phone]  09/28/2016, 2:30 AM

## 2016-09-28 NOTE — Progress Notes (Signed)
Patient ID: Seth Pacific., male   DOB: 03-09-1935, 82 y.o.   MRN: [id number]  CODE BLUE NOTE:  Called by nursing secondary to acute respiratory failure.  Nursing was called by telemetry secondary to hypoxia.  Patient was found by nursing to be unresponsive with sats in the 60s.  Code blue was called.    History: Seth Kipp. is a 82 y.o. male with history of COPD, chronic diastolic CHF, CAD status post CABG, chronic atrial fibrillation on Coumadin, intermittent agitation, and chronic wounds involving the left foot and sacrum. He presented with altered mental status secondary to accidental drug overdose with ativan, haldol and benadryl  On my arrival : PE:  BP 101/60 HR 70s PO2 70% Gen: Unresponsive, apneic HEENT: Pupils equal, pinpoint. CVS: Regular, +S1S2 Pulm:  Bibasilar crackle to mid-lung fields.  White froth suctioned from airway.  Abdomen: Soft, nondistended Ext: No LE edema  A: Acute respiratory failure with hypoxia.  Recent dx: acute encephalopathy 2/2 unintentional overdose , syncope, aspiration pneumopnia s/p Unasyn, Right transudative pleural effusion s/p thoracentesis x 2, acute on chornic diastolic heart failure, AKI on CKD3, chronic afib, DM, anemia, thrombocytopenia.   P: Patient was intubated by anesthesia, no sedation was required.  Stat CXR, ABG, CBC, CMP, trops ordered Versed 1mg  ordered for agitation Critical care consulted. D/W Dr. Jimmy Footman and care transferred.   - [employer], D.O.

## 2016-09-29 ENCOUNTER — Inpatient Hospital Stay (HOSPITAL_COMMUNITY): Payer: Medicare Other

## 2016-09-29 ENCOUNTER — Ambulatory Visit: Payer: Medicare Other | Admitting: Surgery

## 2016-09-29 DIAGNOSIS — J9621 Acute and chronic respiratory failure with hypoxia: Secondary | ICD-10-CM

## 2016-09-29 LAB — BASIC METABOLIC PANEL
ANION GAP: 8 (ref 5–15)
Anion gap: 7 (ref 5–15)
Anion gap: 8 (ref 5–15)
BUN: 19 mg/dL (ref 6–20)
BUN: 17 mg/dL (ref 6–20)
BUN: 15 mg/dL (ref 6–20)
CALCIUM: 9 mg/dL (ref 8.9–10.3)
CHLORIDE: 99 mmol/L — AB (ref 101–111)
CHLORIDE: 101 mmol/L (ref 101–111)
CHLORIDE: 102 mmol/L (ref 101–111)
CO2: 40 mmol/L — ABNORMAL HIGH (ref 22–32)
CO2: 38 mmol/L — ABNORMAL HIGH (ref 22–32)
CO2: 37 mmol/L — AB (ref 22–32)
CREATININE: 0.91 mg/dL (ref 0.61–1.24)
CREATININE: 0.88 mg/dL (ref 0.61–1.24)
CREATININE: 0.91 mg/dL (ref 0.61–1.24)
Calcium: 9.2 mg/dL (ref 8.9–10.3)
Calcium: 9 mg/dL (ref 8.9–10.3)
GFR calc Af Amer: >60 mL/min (ref >60)
GFR calc Af Amer: >60 mL/min (ref >60)
GFR calc non Af Amer: >60 mL/min (ref >60)
GFR calc non Af Amer: >60 mL/min (ref >60)
GLUCOSE: 142 mg/dL — AB (ref 65–99)
GLUCOSE: 125 mg/dL — AB (ref 65–99)
Glucose, Bld: 91 mg/dL (ref 65–99)
POTASSIUM: 2.5 mmol/L — AB (ref 3.5–5.1)
POTASSIUM: 2.8 mmol/L — AB (ref 3.5–5.1)
Potassium: 3.5 mmol/L (ref 3.5–5.1)
SODIUM: 147 mmol/L — AB (ref 135–145)
SODIUM: 146 mmol/L — AB (ref 135–145)
Sodium: 147 mmol/L — ABNORMAL HIGH (ref 135–145)

## 2016-09-29 LAB — CBC
HCT: 31.1 % — ABNORMAL LOW (ref 39.0–52.0)
HEMOGLOBIN: 9.1 g/dL — AB (ref 13.0–17.0)
MCH: 25.7 pg — ABNORMAL LOW (ref 26.0–34.0)
MCHC: 29.3 g/dL — ABNORMAL LOW (ref 30.0–36.0)
MCV: 87.9 fL (ref 78.0–100.0)
Platelets: 170 10*3/uL (ref 150–400)
RBC: 3.54 MIL/uL — AB (ref 4.22–5.81)
RDW: 17.7 % — ABNORMAL HIGH (ref 11.5–15.5)
WBC: 9.5 10*3/uL (ref 4.0–10.5)

## 2016-09-29 LAB — GLUCOSE, CAPILLARY
GLUCOSE-CAPILLARY: 103 mg/dL — AB (ref 65–99)
GLUCOSE-CAPILLARY: 103 mg/dL — AB (ref 65–99)
GLUCOSE-CAPILLARY: 115 mg/dL — AB (ref 65–99)
Glucose-Capillary: 78 mg/dL (ref 65–99)
Glucose-Capillary: 128 mg/dL — ABNORMAL HIGH (ref 65–99)
Glucose-Capillary: 120 mg/dL — ABNORMAL HIGH (ref 65–99)

## 2016-09-29 LAB — PROTIME-INR
INR: 1.44
PROTHROMBIN TIME: 17.7 s — AB (ref 11.4–15.2)

## 2016-09-29 LAB — FERRITIN: FERRITIN: 44 ng/mL (ref 24–336)

## 2016-09-29 MED ORDER — POTASSIUM CHLORIDE 20 MEQ/15ML (10%) PO SOLN
60.0000 meq | Freq: Once | ORAL | Status: AC
  Administered 2016-09-29: 60 meq
  Filled 2016-09-29: qty 45

## 2016-09-29 MED ORDER — SODIUM CHLORIDE 0.9 % IV SOLN
0.4000 ug/kg/h | INTRAVENOUS | Status: DC
  Administered 2016-09-30: 0.4 ug/kg/h via INTRAVENOUS
  Filled 2016-09-29: qty 2

## 2016-09-29 MED ORDER — FUROSEMIDE 10 MG/ML IJ SOLN
80.0000 mg | Freq: Three times a day (TID) | INTRAMUSCULAR | Status: DC
  Administered 2016-09-29: 80 mg via INTRAVENOUS
  Filled 2016-09-29 (×3): qty 8

## 2016-09-29 MED ORDER — POTASSIUM CHLORIDE 20 MEQ/15ML (10%) PO SOLN
ORAL | Status: AC
  Filled 2016-09-29: qty 15

## 2016-09-29 MED ORDER — POTASSIUM CHLORIDE 20 MEQ/15ML (10%) PO SOLN
40.0000 meq | Freq: Every day | ORAL | Status: DC
  Filled 2016-09-29: qty 30

## 2016-09-29 MED ORDER — SODIUM CHLORIDE 0.9 % IV BOLUS (SEPSIS)
250.0000 mL | Freq: Once | INTRAVENOUS | Status: AC
  Administered 2016-09-29: 250 mL via INTRAVENOUS

## 2016-09-29 MED ORDER — HEPARIN (PORCINE) IN NACL 100-0.45 UNIT/ML-% IJ SOLN
1200.0000 [IU]/h | INTRAMUSCULAR | Status: DC
  Administered 2016-09-29: 1050 [IU]/h via INTRAVENOUS
  Administered 2016-09-30: 1200 [IU]/h via INTRAVENOUS
  Filled 2016-09-29 (×3): qty 250

## 2016-09-29 MED ORDER — SODIUM CHLORIDE 0.9 % IV BOLUS (SEPSIS)
250.0000 mL | Freq: Once | INTRAVENOUS | Status: AC
Start: 2016-09-29 — End: 2016-09-29
  Administered 2016-09-29: 250 mL via INTRAVENOUS

## 2016-09-29 NOTE — Progress Notes (Signed)
Nutrition Follow-up  DOCUMENTATION CODES:   Severe malnutrition in context of acute illness/injury  INTERVENTION:   If unable to extubate patient within next 24 hours, recommend start TF with:   Vital AF 1.2 at 60 ml/h (1440 ml per day)  Provides 1728 kcal, 108 gm protein, 1168 ml free water daily  When feeding started, monitor magnesium, potassium, and phosphorus daily for at least 3 days, MD to replete as needed, as pt is at risk for refeeding syndrome given severe PCM with recent minimal intake.  NUTRITION DIAGNOSIS:   Malnutrition (severe) related to acute illness (dysphagia) as evidenced by percent weight loss, moderate depletion of body fat, severe depletion of body fat, moderate depletions of muscle mass, severe depletion of muscle mass.  Ongoing  GOAL:   Patient will meet greater than or equal to 90% of their needs  Unmet  MONITOR:   Vent status, Skin, Labs, I & O's  REASON FOR ASSESSMENT:   Ventilator    ASSESSMENT:   82 y.o. Male with medical history significant for COPD with chronic respiratory failure, chronic diastolic CHF, CAD status post CABG, chronic atrial fibrillation on Coumadin, intermittent agitation, and chronic wounds involving the left foot and sacrum who presents to the emergency department from his SNF for evaluation of somnolence following an accident abnormal overdose of Ativan, Haldol, and Benadryl.  Discussed patient in ICU rounds and with RN today. Patient was intubated on 4/8, OGT currently to suction. Hopeful to start TF soon. Prior to intubation, patient was on a regular diet with poor intake of meals (0-40%).  Patient is currently intubated on ventilator support MV: 8.6 L/min Temp (24hrs), Avg:98.4 F (36.9 C), Min:97.8 F (36.6 C), Max:98.9 F (37.2 C)   Labs reviewed: sodium 146 (H), potassium 2.8 (L) Medications reviewed and include Lasix, KCl, MVI.   Diet Order:  Diet NPO time specified Except for: Sips with  Meds  Skin:  Wound (see comment) (fissure gluteal fold, unst R heel & ankle, unst L foot & leg)  Last BM:  4/7  Height:   Ht Readings from Last 1 Encounters:  09/28/16 5\' 10"  (1.778 m)    Weight:   Wt Readings from Last 1 Encounters:  09/29/16 173 lb 8 oz (78.7 kg)    Ideal Body Weight:  75.4 kg  BMI:  Body mass index is 24.89 kg/m.  Estimated Nutritional Needs:   Kcal:  1517  Protein:  110-125 gm  Fluid:  1.7-1.9 L  EDUCATION NEEDS:   No education needs identified at this time  [name], [name], [name], [name] Pager [phone] After Hours Pager [phone]

## 2016-09-29 NOTE — Progress Notes (Signed)
STAFF NOTE: I, Dr Ann Lions have personally reviewed patient's available data, including medical history, events of note, physical examination and test results as part of my evaluation. I have discussed with resident/NP and other care providers such as pharmacist, RN and RRT.  In addition,  I personally evaluated patient and elicited key findings of   S: failed SBT but was on fent gtt. Reports of agitation on fent gtt. On diuresis with 4L negative since admit. In SNF for unclear reasons. Afrebrile. Per resident effusion are transudate this admit  LOS 9 days  O: RASS -3 on fent gtt. Able to move 4s per RN Sync with vent Not on pressors Reports of sacral decub  CXR - R > L effusion ; visulaized ECHO feb 2018 - PASP 83 with RV strain CTA this admit - neg for PE  A: acute resp failure - failure to wean due to agitation and also massive effusions transudative R >L .  Prior hx of siginificant RV strain  P: increase diuresis antiocago fiwth IV heparin for A Fib chronic Check ECHO Check RUQ liver US  Rule out cirrhosis - if +ve add aldactone Depending on course needs Right heart cath possibly Goals of care needed - SNF patient with decub and now LOS 9 days    .  Rest per NP/medical resident whose note is outlined above and that I agree with  The patient is critically ill with multiple organ systems failure and requires high complexity decision making for assessment and support, frequent evaluation and titration of therapies, application of advanced monitoring technologies and extensive interpretation of multiple databases.   Critical Care Time devoted to patient care services described in this note is  30  Minutes. This time reflects time of care of this signee Dr Brand Males. This critical care time does not reflect procedure time, or teaching time or supervisory time of PA/NP/Med student/Med Resident etc but could involve care discussion time    Dr. Brand Males, M.D.,  [facility].C.P Pulmonary and Critical Care Medicine Staff Physician [place] [facility] Pager: [phone], If no answer or between  15:00h - 7:00h: call [phone]  09/29/2016 8:38 AM

## 2016-09-29 NOTE — Progress Notes (Addendum)
SLP Cancellation Note  Patient Details Name: [name]. MRN: [id number] DOB: 12/27/1934   Cancelled treatment:       Reason Eval/Treat Not Completed: Medical issues which prohibited therapy. Signing off. Please reconsult when appropriate.   [place], CCC-SLP [phone]  Agnieszka Newhouse Meryl 09/29/2016, 2:15 PM

## 2016-09-29 NOTE — Progress Notes (Addendum)
BP remains low with systolic in 46'K. RN has already minimized sedations as much as possible but his BP remains low. He was diuresed well with lasix earlier. CVP currently was 2. He may have been overdiuresed now leading to hypotension. BMET reviewed, K improved 3.5   - will do small bolus 250cc 0.9% NaCL - ordered 40 meq 10% Kcl solution per tube.  - added holding parameter to metoprolol. Received this morning.

## 2016-09-29 NOTE — Progress Notes (Signed)
eLink Physician-Brief Progress Note Patient Name: [name]. DOB: [birth year] MRN: [id number]   Date of Service  09/29/2016  HPI/Events of Note  Diuresing well K 2.5 >> 2.8  eICU Interventions  Hold further lasix until K repleted May need standing oral K (allergy to IV K listed)     Intervention Category Intermediate Interventions: Electrolyte abnormality - evaluation and management  ALVA,RAKESH V. 09/29/2016, 7:09 PM

## 2016-09-29 NOTE — Progress Notes (Signed)
   09/29/16 2336  Respiratory Severity Assessment  Respiratory History 2  Breath Sounds 1  Respiratory Pattern 0  Cough 2  Chest X Ray 1  O2 Requirements 1  Level of Activity 2  Dyspnea 0  Score Total 9  Aerosolized Bronchodilators  Aerosolized bronchodilator indications Aerosolized bronchodilator indicated;COPD exacerbation/maintenance therapy;Bronchospasm/Wheezing  Aerosolized bronchodilator goals Bronchodilation;Relief of shortness of SOB;Reduce WOB  Plan of care Hand held neb treatment  Oxygen Therapy  Oxygen therapy indications Oxygen therapy indicated;Hypoxia;Hypoxemia;SOB/Increased WOB;SpO2 less than 92% or MD goal;Increased myocardial O2 demand  Oxygen therapy goals Treat hypoxia/hypoxemia;Treat myocardial O2 demand;Decrease WOB;Alleviate SOB  Plan of care (vent)  Respiratory Therapy Follow Up Assessment  Assessment follow up date 09/30/16  Follow-up assessment complete Yes

## 2016-09-29 NOTE — Progress Notes (Signed)
ANTICOAGULATION CONSULT NOTE - Initial Consult  Pharmacy Consult for heparin Indication: atrial fibrillation  Allergies  Allergen Reactions  . Potassium-Containing Compounds Anaphylaxis    IV--loss of conciousness  . Metoprolol Succinate Other (See Comments)    Extremely tired    Patient Measurements: Height: 5\' 10"  (177.8 cm) Weight: 173 lb 8 oz (78.7 kg) IBW/kg (Calculated) : 73 Heparin Dosing Weight: 78.7 kg  Vital Signs: Temp: 98.9 F (37.2 C) (04/09 0809) Temp Source: Oral (04/09 0809) BP: 125/83 (04/09 0800) Pulse Rate: 62 (04/09 0800)  Labs:  Recent Labs  09/28/16 0140 09/28/16 0858 09/29/16 0424  HGB 9.6* 8.8* 9.1*  HCT 34.2* 31.3* 31.1*  PLT 188 164 170  CREATININE 1.11 0.98 0.91  TROPONINI 0.09*  --   --     Estimated Creatinine Clearance: 68 mL/min (by C-G formula based on SCr of 0.91 mg/dL).   Medical History: Past Medical History:  Diagnosis Date  . Aortal stenosis 2006   biophrostetic  . Arthritis   . Atrial fibrillation ([place])    onset after CABG--- coumadin management at Coumadin clinic  . Carotid artery disease ([place]) 09/27/2010   Carotid US 12/17: bilat ICA 40-59 >> 1 year FU  . CHF (congestive heart failure) ([place])    abter CABG  . Complete heart block ([place])   . Coronary artery disease   . DM type 2 (diabetes mellitus, type 2) ([place])    used to see Dr Meredith Pel, now f/u by Dr Larose Kells  . Erectile dysfunction   . Gangrene ([place])   . History of nephrolithiasis   . HTN (hypertension)   . Hyperlipidemia   . Memory loss   . Obesity   . OSA (obstructive sleep apnea)    not using CPAP  . Pacemaker 2/07   VVI  . PVD (peripheral vascular disease) ([name])    CT angio showed- vascular insuff, intestine and RAS bilaterally, andgiogram 02/2009 severe PVD medical management  . S/P aortic valve replacement   . Sick sinus syndrome ([name])    [name]  . Skin cancer    surgery (nose) 09-2010    Assessment: 82 yo male on warfarin prior to  admission for AFib CHADSVASc = 4 Initially held on admission due to high bleeding risk, had an upper GI bleed in Feb 2018 Received 2 units of pRBC earlier in his admission Hgb 9.1, plts wnl   Goal of Therapy:  Heparin level 0.3-0.7 units/ml Monitor platelets by anticoagulation protocol: Yes    Plan:  -Heparin at 1050 units/hr -Daily HL, CBC -Level tonight   Harvel Quale 09/29/2016,8:38 AM

## 2016-09-29 NOTE — Progress Notes (Signed)
Patient transferred to 58M on yesterday following code blue. Patient now intubated. CSW following for disposition and return to SNF when medically appropriate. Patient from [facility].    Lorrine Kin, MSW, LCSW [facility] ED/58M Clinical Social Worker [phone]

## 2016-09-29 NOTE — Consult Note (Signed)
WOC consulted again for multiple wounds, see consult note from 09/22/16 at which time assessment and orders written for all wounds.   Weekly measurements should be obtained by bedside nurse.   Re consult if needed, will not follow at this time. Thanks  Mylinh Cragg [employer], RN,CWOCN, CNS [phone])

## 2016-09-29 NOTE — Progress Notes (Signed)
PULMONARY  / CRITICAL CARE MEDICINE  Name: [name]. MRN: [id number] DOB: January 23, 1935    LOS: 62  REFERRING MD :  Dr. Harvie Bridge  CHIEF COMPLAINT:  Respiratory failure  BRIEF PATIENT DESCRIPTION: Seth Furney. is a 82 y.o. male COPD, acute on chronic HFpEF, CAD status post CABG, aortic valve replacement and pacemaker placement, chronic atrial fibrillation on Coumadin who initially presented on 08/25/2016 with altered mentation in the setting of accidental medication overdose (ativan, haldol and benadryl). His mental status gradually improved through the past week. CXR showed bilateral pleural effusions and signs of pulmonary edema. He underwent thoracentesis 4/5 morning with removal of 1.4L of transudative fluid. For the past couple of days he has been getting furosemide therapy. This morning, he was hypoxic on monitor with altered mental status and Code Blue was called for intubation. After arrival in MICU, patient was responsive and moving all extremities while intubated.  LINES / TUBES: PIC 4/3 >> PICC 4/8>>  CULTURES: Rt effusion culture- 4/5 >> NGTD  ANTIBIOTICS: unasyn 3/30-4/4  Imaging:  CXR 4/5>> no change in diffuse pulmonary edema and b/l pleural effusions  ECHO 4/8>> EF 55-60%, ventricular septal showed paradox motion consistent with RV pressure overload, PA peak pressure 79mmHg  LEVEL OF CARE:  ICU PRIMARY SERVICE:  PCCM CODE STATUS: FULL DIET:  NPO DVT Px:  Coumadin per afib GI Px:  protonix 40mg  per tube    PAST MEDICAL HISTORY :  Past Medical History:  Diagnosis Date  . Aortal stenosis 2006   biophrostetic  . Arthritis   . Atrial fibrillation ([place])    onset after CABG--- coumadin management at Coumadin clinic  . Carotid artery disease ([place]) 09/27/2010   Carotid US 12/17: bilat ICA 40-59 >> 1 year FU  . CHF (congestive heart failure) ([place])    abter CABG  . Complete heart block ([place])   . Coronary artery disease   . DM type 2 (diabetes  mellitus, type 2) ([place])    used to see Dr Meredith Pel, now f/u by Dr Larose Kells  . Erectile dysfunction   . Gangrene ([place])   . History of nephrolithiasis   . HTN (hypertension)   . Hyperlipidemia   . Memory loss   . Obesity   . OSA (obstructive sleep apnea)    not using CPAP  . Pacemaker 2/07   VVI  . PVD (peripheral vascular disease) ([name])    CT angio showed- vascular insuff, intestine and RAS bilaterally, andgiogram 02/2009 severe PVD medical management  . S/P aortic valve replacement   . Sick sinus syndrome ([name])    [name]  . Skin cancer    surgery (nose) 09-2010   Past Surgical History:  Procedure Laterality Date  . ABDOMINAL AORTAGRAM N/A 11/10/2012   Procedure: ABDOMINAL Maxcine Ham;  Surgeon: Serafina Mitchell, MD;  Location: [facility] CATH LAB;  Service: Cardiovascular;  Laterality: N/A;  . AMPUTATION  01/2010   great toe  . AMPUTATION Left 07/16/2016   Procedure: RECECTION OF LEFT GREAT TOE METATARSAL HEAD and sesamoid;  Surgeon: Waynetta Sandy, MD;  Location: [place];  Service: Vascular;  Laterality: Left;  . AMPUTATION Left 07/31/2016   Procedure: SECOND TOE AMPUTATION;  Surgeon: Serafina Mitchell, MD;  Location: [facility] OR;  Service: Vascular;  Laterality: Left;  . ANGIOPLASTY / STENTING FEMORAL  02/10/10   Left femoral  . ANGIOPLASTY ILLIAC ARTERY Left 07/16/2016   Procedure: drug coated Balloon ANGIOPLASTY of left superficial femoral artry stent;  Surgeon: Waynetta Sandy, MD;  Location: [place];  Service: Vascular;  Laterality: Left;  . AORTIC VALVE REPLACEMENT     biophrostetic for Ao Stenosis 2006  . AORTIC VALVE REPLACEMENT  01/17/2011   S/P redo median sternotomy, extracorporeal circulation, redo Aortic Valve Replacement using a 23-mm Edwards pericardial Magna-Ease valve, Dr Ulla Gallo  . AORTOGRAM N/A 07/16/2016   Procedure: left lower extremity angiogram;  Surgeon: Waynetta Sandy, MD;  Location: [place];  Service: Vascular;  Laterality: N/A;  .  APPLICATION OF WOUND VAC Left 07/31/2016   Procedure: APPLICATION OF WOUND VAC;  Surgeon: Serafina Mitchell, MD;  Location: [place];  Service: Vascular;  Laterality: Left;  . CATARACT EXTRACTION, BILATERAL    . CORONARY ARTERY BYPASS GRAFT  2006  . EP IMPLANTABLE DEVICE N/A 06/06/2016   MDT Generator change with Advisa SR MRI device implanted by Dr Rayann Heman  . ESOPHAGOGASTRODUODENOSCOPY (EGD) WITH PROPOFOL N/A 08/07/2016   Procedure: ESOPHAGOGASTRODUODENOSCOPY (EGD) WITH PROPOFOL;  Surgeon: Mauri Pole, MD;  Location: [place] ENDOSCOPY;  Service: Endoscopy;  Laterality: N/A;  . EYE SURGERY    . ILIAC ATHERECTOMY Left 07/16/2016   Procedure: Angioplasty and ATHERECTOMY of left anterior tibial artery;  Surgeon: Waynetta Sandy, MD;  Location: [place];  Service: Vascular;  Laterality: Left;  . INCISE AND DRAIN ABCESS Right May 2014   right heel   . IR THORACENTESIS ASP PLEURAL SPACE W/IMG GUIDE  09/25/2016  . LOWER EXTREMITY ANGIOGRAM Left 11/23/2012   Procedure: LOWER EXTREMITY ANGIOGRAM;  Surgeon: Serafina Mitchell, MD;  Location: [facility] CATH LAB;  Service: Cardiovascular;  Laterality: Left;  . PACEMAKER PLACEMENT  07/2005   Medtronic Sigma SR implanted by Dr Olevia Perches  . PERIPHERAL VASCULAR CATHETERIZATION N/A 07/10/2016   Procedure: Abdominal Aortogram;  Surgeon: Conrad [place], MD;  Location: [place] CV LAB;  Service: Cardiovascular;  Laterality: N/A;  . PERIPHERAL VASCULAR CATHETERIZATION Bilateral 07/10/2016   Procedure: Lower Extremity Angiography;  Surgeon: Conrad , MD;  Location: [place] CV LAB;  Service: Cardiovascular;  Laterality: Bilateral;  . TONSILLECTOMY    . ULTRASOUND GUIDANCE FOR VASCULAR ACCESS Bilateral 07/16/2016   Procedure: ULTRASOUND GUIDANCE FOR VASCULAR ACCESS of right common femoral and left dorsalis pedis;  Surgeon: Waynetta Sandy, MD;  Location: [facility] OR;  Service: Vascular;  Laterality: Bilateral;  . WOUND DEBRIDEMENT Left 07/31/2016   Procedure: DEBRIDEMENT GREAT  TOE AMPUTATION SITE;  Surgeon: Serafina Mitchell, MD;  Location: [facility] OR;  Service: Vascular;  Laterality: Left;   Prior to Admission medications   Medication Sig Start Date End Date Taking? Authorizing Provider  acetaminophen (TYLENOL) 325 MG tablet Take 650 mg by mouth 3 (three) times daily. 6AM, 2PM, 10PM   Yes Historical Provider, MD  albuterol (PROVENTIL) (2.5 MG/3ML) 0.083% nebulizer solution Take 3 mLs (2.5 mg total) by nebulization every 4 (four) hours as needed for wheezing or shortness of breath. 08/08/16  Yes Albertine Patricia, MD  ampicillin (PRINCIPEN) 500 MG capsule Take 500 mg by mouth every 6 (six) hours. 10 day course ordered 09/12/16 for wound infection in left foot   Yes Historical Provider, MD  aspirin EC 81 MG EC tablet Take 1 tablet (81 mg total) by mouth daily. 07/22/16  Yes Oswald Hillock, MD  diphenhydrAMINE (BENADRYL) 25 MG tablet Take 25 mg by mouth See admin instructions. Take 1 tablet (25 mg) by mouth every 8 hours for allergic reaction - order date 09/10/16   Yes Historical Provider, MD  docusate sodium (COLACE) 100 MG capsule Take 1 capsule (100 mg total) by mouth 2 (two) times daily. 08/08/16  Yes Albertine Patricia, MD  donepezil (ARICEPT) 10 MG tablet Take 1 tablet (10 mg total) by mouth at bedtime. 05/08/16  Yes Kathrynn Ducking, MD  ezetimibe-simvastatin (VYTORIN) 10-40 MG per tablet Take 1 tablet by mouth at bedtime. 05/06/12  Yes Larey Dresser, MD  ferrous sulfate 325 (65 FE) MG tablet Take 1 tablet (325 mg total) by mouth 2 (two) times daily with a meal. 08/08/16  Yes Albertine Patricia, MD  furosemide (LASIX) 40 MG tablet Take 1 tablet (40 mg total) by mouth 2 (two) times daily. 07/21/16  Yes Oswald Hillock, MD  insulin glargine (LANTUS) 100 UNIT/ML injection Inject 0.05 mLs (5 Units total) into the skin at bedtime. 08/08/16  Yes Silver Huguenin Elgergawy, MD  insulin lispro (HUMALOG) 100 UNIT/ML injection Inject 0-9 Units into the skin See admin instructions. Inject 1-9 unit  subcutaneously three time daily with meals per sliding scale: CBG <70 initiate hypoglycemic protocol, 70-120 0 units, 121-150 1 unit, 151-200 2 units, 201-250 3 units, 251-300 5 units, 301-350 7 units, 351-400 9 units, >400 Call MD   Yes Historical Provider, MD  ipratropium (ATROVENT) 0.02 % nebulizer solution Take 2.5 mLs (0.5 mg total) by nebulization every 4 (four) hours as needed for wheezing or shortness of breath. 08/08/16  Yes Albertine Patricia, MD  metoprolol tartrate (LOPRESSOR) 25 MG tablet Take 1 tablet (25 mg total) by mouth 2 (two) times daily. 08/08/16  Yes Albertine Patricia, MD  Multiple Vitamins-Minerals (DECUBI-VITE) CAPS Take 1 capsule by mouth daily.   Yes Historical Provider, MD  nystatin (MYCOSTATIN/NYSTOP) powder Apply topically See admin instructions. Apply topically to groin three times daily for irritation   Yes Historical Provider, MD  OXYGEN Inhale 2 L into the lungs continuous.   Yes Historical Provider, MD  pantoprazole (PROTONIX) 40 MG tablet Take 1 tablet (40 mg total) by mouth 2 (two) times daily. 08/08/16  Yes Silver Huguenin Elgergawy, MD  potassium chloride SA (K-DUR,KLOR-CON) 20 MEQ tablet Take 20 mEq by mouth 2 (two) times daily.   Yes Historical Provider, MD  PRESCRIPTION MEDICATION Take 1 mL by mouth See admin instructions. Gel compounded at [facility] for [place]: Ativan 1 mg/Benadryl 25 mg/ Haldol 1 mg - per ml - apply 1 ml topically twice daily for anxiety/paranoia   Yes Historical Provider, MD  QUEtiapine (SEROQUEL) 50 MG tablet Take 50 mg by mouth at bedtime.   Yes Historical Provider, MD  warfarin (COUMADIN) 2.5 MG tablet Take 2.5 mg by mouth daily.    Historical Provider, MD   Allergies  Allergen Reactions  . Potassium-Containing Compounds Anaphylaxis    IV--loss of conciousness  . Metoprolol Succinate Other (See Comments)    Extremely tired    FAMILY HISTORY:  Family History  Problem Relation Age of Onset  . Heart attack  Father 78  . Heart disease Father     Before age 42  . Hypertension Father   . Stroke Mother 15  . Cancer Sister     Cervical  . Heart attack Sister   . Heart disease Sister   . Diabetes Sister     2 sister w/ DM  . Heart attack Sister   . Heart attack Sister   . Heart disease Sister 69    AAA  and  Stomach Aneurysm  . AAA (abdominal aortic  aneurysm) Sister   . Heart disease Sister     Before age 72  . Heart attack Sister 26  . Colon cancer Neg Hx   . Prostate cancer Neg Hx    SOCIAL HISTORY:  reports that he quit smoking about 28 years ago. His smoking use included Cigarettes. He has quit using smokeless tobacco. He reports that he does not drink alcohol or use drugs.    INTERVAL HISTORY:   VITAL SIGNS: Temp:  [97.6 F (36.4 C)-98.6 F (37 C)] 97.8 F (36.6 C) (04/09 0329) Pulse Rate:  [31-68] 66 (04/09 0600) Resp:  [16-22] 16 (04/09 0600) BP: (65-127)/(35-65) 109/65 (04/09 0600) SpO2:  [97 %-100 %] 100 % (04/09 0600) FiO2 (%):  [40 %-50 %] 40 % (04/09 0400) Weight:  [173 lb 8 oz (78.7 kg)] 173 lb 8 oz (78.7 kg) (04/09 0421) HEMODYNAMICS:   VENTILATOR SETTINGS: Vent Mode: PRVC FiO2 (%):  [40 %-50 %] 40 % Set Rate:  [16 bmp] 16 bmp Vt Set:  [580 mL] 580 mL PEEP:  [5 cmH20-8 cmH20] 5 cmH20 Plateau Pressure:  [19 cmH20-24 cmH20] 19 cmH20 INTAKE / OUTPUT: Intake/Output      04/08 0701 - 04/09 0700 04/09 0701 - 04/10 0700   P.O.     I.V. (mL/kg) 318.8 (4.1)    NG/GT 60    Total Intake(mL/kg) 378.8 (4.8)    Urine (mL/kg/hr) 2190 (1.2)    Stool     Total Output 2190     Net -1811.2            PHYSICAL EXAMINATION: General:  Elderly, NAD, sedated Neuro:  RASS -2 HEENT:  ETT, moist mucous membranes Cardiovascular:  Bradycardic, LLL sternal murmur  Lungs:  CTAB, no wheezing or crackles Abdomen:  Non TTP, soft    LABS: Cbc  Recent Labs Lab 09/28/16 0140 09/28/16 0858 09/29/16 0424  WBC 9.2 10.3 9.5  HGB 9.6* 8.8* 9.1*  HCT 34.2* 31.3* 31.1*   PLT 188 164 170    Chemistry   Recent Labs Lab 09/23/16 1923 09/23/16 2205 09/25/16 0236  09/28/16 0140 09/28/16 0858 09/29/16 0424  NA 145 143 142  < > 143 144 147*  K 5.8* 3.6 3.3*  < > 3.6 3.5 2.5*  CL 101 101 100*  < > 98* 99* 99*  CO2 30 33* 33*  < > 36* 37* 40*  BUN 53* 53* 44*  < > 28* 26* 19  CREATININE 1.62* 1.61* 1.46*  < > 1.11 0.98 0.91  CALCIUM 5.4* 9.0 8.9  < > 9.2 9.1 9.2  MG 1.1* 2.2 2.0  --   --  1.7  --   PHOS 3.5  --   --   --   --  1.9*  --   GLUCOSE 165* 188* 154*  < > 238* 195* 91  < > = values in this interval not displayed.  Liver fxn  Recent Labs Lab 09/25/16 0236 09/28/16 0140  AST  --  64*  ALT  --  59  ALKPHOS  --  284*  BILITOT  --  0.5  PROT 6.0* 6.5  ALBUMIN  --  1.8*   coags No results for input(s): APTT, INR in the last 168 hours. Sepsis markers No results for input(s): LATICACIDVEN, PROCALCITON in the last 168 hours. Cardiac markers  Recent Labs Lab 09/28/16 0140  TROPONINI 0.09*   BNP No results for input(s): PROBNP in the last 168 hours. ABG  Recent Labs Lab 09/23/16 1206 09/28/16  0140  PHART 7.438 7.327*  PCO2ART 52.5* 66.6*  PO2ART 134* 269*  HCO3 35.0* 33.9*    CBG trend  Recent Labs Lab 09/28/16 1216 09/28/16 1617 09/28/16 1959 09/28/16 2352 09/29/16 0338  GLUCAP 145* 131* 149* 125* 69    IMAGING:  ECG:  DIAGNOSES: Principal Problem:   Overdose, accidental or unintentional, initial encounter Active Problems:   DM (diabetes mellitus) type II uncontrolled, periph vascular disorder ([name])   Obstructive sleep apnea   Essential hypertension   CAD (coronary artery disease)   Chronic atrial fibrillation ([name])   Pleural effusion, left   Anemia   Chronic respiratory failure with hypoxia ([name])   AKI (acute kidney injury) ([place])   Accidental overdose   Acute on chronic respiratory failure with hypoxia ([name])   Pressure injury of skin   Protein-calorie malnutrition, severe   ASSESSMENT /  PLAN:  PULMONARY A: Acute hypoxic respiratory failure with pulmonary edema and pleural effusions On home 2L O2 Recurrent rt pleural effusion s/p thoracentesis --4/5 yielded 1.4L clear, yellow fluid; also thoracentesis on 3/31 COPD Severe pulmonary htn, CPAP qhs at home P:   Will continue mechanical ventilation, failed wean this am VAP bundle and lung protective ventilation  CARDIOVASCULAR A:  HFpEF, s/p aortic valve replacement, s/p CABG PPM Chronic atrial fibrillation PVD P:  Continued IV diuresis for fluid overload Start on anticoagulation, hgb at b/l (8-9)  RENAL A:   CKD P:   Tolerating diuresis with improving creatinine K 2.5, given 103meq, repeat BMET at noon   GASTROINTESTINAL A:   Mild abdominal pain P:   Will monitor OG tube, consider TF tomorrow if still intubated  HEMATOLOGIC A:   Systemic anticoagulation, held due to high bleeding risk P:  Resume coumadin Checking ferritin   INFECTIOUS A:   Multiple skin wounds/ulcers Low suspicion for pulmonary infection  P:   Received 6 days of unasyn for possible asp pna Wound care consulted  ENDOCRINE A:   Diabetes mellitus P:   SSI   NEUROLOGIC A:   Acute encephalopathy in setting of hypoxemia, nonfocal exam. Consideration of TIA P:   Fentanyl, midazolam PRN while intubated to use for RASS goal 0   Julious Oka, MD Internal Medicine Resident, PGY [facility] Pager: [phone]    09/29/2016, 7:22 AM

## 2016-09-29 NOTE — Consult Note (Signed)
Referring Physician: Dr Jari Favre  Patient name: [name]. MRN: [id number] DOB: 10-Dec-1934 Sex: male  REASON FOR CONSULT: Ischemic left leg  HPI: [name]. is a 82 y.o. male admitted about 10 days ago with medication overdose, respiratory failure.  Pt known to our service with multiple prior revascularizations. We were called today for mottled cool left leg.  Pt was last seen by Dr Trula Slade 2 weeks ago.  At that time Dr Trula Slade thought no further revasc attempts and would eventually need left AKA for non healing wounds.  Pt is currently on ventilator after respiratory failure yesterday.  Family not currently here but does have wife.  Other medical problems include CHF, afib (on heparin), DM, chronic left heel wound, sleep apnea.  He is currently sedated and agitated on vent unable to provide history.  He also has a chronic right heel wound.  Past Medical History:  Diagnosis Date  . Aortal stenosis 2006   biophrostetic  . Arthritis   . Atrial fibrillation ([place])    onset after CABG--- coumadin management at [facility] clinic  . Carotid artery disease ([place]) 09/27/2010   Carotid US 12/17: bilat ICA 40-59 >> 1 year FU  . CHF (congestive heart failure) ([place])    abter CABG  . Complete heart block ([place])   . Coronary artery disease   . DM type 2 (diabetes mellitus, type 2) ([place])    used to see Dr Meredith Pel, now f/u by Dr Larose Kells  . Erectile dysfunction   . Gangrene ([place])   . History of nephrolithiasis   . HTN (hypertension)   . Hyperlipidemia   . Memory loss   . Obesity   . OSA (obstructive sleep apnea)    not using CPAP  . Pacemaker 2/07   VVI  . PVD (peripheral vascular disease) ([name])    CT angio showed- vascular insuff, intestine and RAS bilaterally, andgiogram 02/2009 severe PVD medical management  . S/P aortic valve replacement   . Sick sinus syndrome ([name])    [name]  . Skin cancer    surgery (nose) 09-2010   Past Surgical History:  Procedure Laterality  Date  . ABDOMINAL AORTAGRAM N/A 11/10/2012   Procedure: ABDOMINAL Maxcine Ham;  Surgeon: Serafina Mitchell, MD;  Location: [facility] CATH LAB;  Service: Cardiovascular;  Laterality: N/A;  . AMPUTATION  01/2010   great toe  . AMPUTATION Left 07/16/2016   Procedure: RECECTION OF LEFT GREAT TOE METATARSAL HEAD and sesamoid;  Surgeon: Waynetta Sandy, MD;  Location: [place];  Service: Vascular;  Laterality: Left;  . AMPUTATION Left 07/31/2016   Procedure: SECOND TOE AMPUTATION;  Surgeon: Serafina Mitchell, MD;  Location: [place] OR;  Service: Vascular;  Laterality: Left;  . ANGIOPLASTY / STENTING FEMORAL  02/10/10   Left femoral  . ANGIOPLASTY ILLIAC ARTERY Left 07/16/2016   Procedure: drug coated Balloon ANGIOPLASTY of left superficial femoral artry stent;  Surgeon: Waynetta Sandy, MD;  Location: [place];  Service: Vascular;  Laterality: Left;  . AORTIC VALVE REPLACEMENT     biophrostetic for Ao Stenosis 2006  . AORTIC VALVE REPLACEMENT  01/17/2011   S/P redo median sternotomy, extracorporeal circulation, redo Aortic Valve Replacement using a 23-mm Edwards pericardial Magna-Ease valve, Dr Ulla Gallo  . AORTOGRAM N/A 07/16/2016   Procedure: left lower extremity angiogram;  Surgeon: Waynetta Sandy, MD;  Location: [facility] OR;  Service: Vascular;  Laterality: N/A;  . APPLICATION OF WOUND VAC Left 07/31/2016  Procedure: APPLICATION OF WOUND VAC;  Surgeon: Serafina Mitchell, MD;  Location: [place];  Service: Vascular;  Laterality: Left;  . CATARACT EXTRACTION, BILATERAL    . CORONARY ARTERY BYPASS GRAFT  2006  . EP IMPLANTABLE DEVICE N/A 06/06/2016   MDT Generator change with Advisa SR MRI device implanted by Dr Rayann Heman  . ESOPHAGOGASTRODUODENOSCOPY (EGD) WITH PROPOFOL N/A 08/07/2016   Procedure: ESOPHAGOGASTRODUODENOSCOPY (EGD) WITH PROPOFOL;  Surgeon: Mauri Pole, MD;  Location: [place] [facility];  Service: Endoscopy;  Laterality: N/A;  . EYE SURGERY    . ILIAC ATHERECTOMY Left 07/16/2016   Procedure:  Angioplasty and ATHERECTOMY of left anterior tibial artery;  Surgeon: Waynetta Sandy, MD;  Location: [place];  Service: Vascular;  Laterality: Left;  . INCISE AND DRAIN ABCESS Right May 2014   right heel   . IR THORACENTESIS ASP PLEURAL SPACE W/IMG GUIDE  09/25/2016  . LOWER EXTREMITY ANGIOGRAM Left 11/23/2012   Procedure: LOWER EXTREMITY ANGIOGRAM;  Surgeon: Serafina Mitchell, MD;  Location[facility] CATH LAB;  Service: Cardiovascular;  Laterality: Left;  . PACEMAKER PLACEMENT  07/2005   Medtronic Sigma SR implanted by Dr Olevia Perches  . PERIPHERAL VASCULAR CATHETERIZATION N/A 07/10/2016   Procedure: Abdominal Aortogram;  Surgeon: Conrad Ester, MD;  Location: [place] CV LAB;  Service: Cardiovascular;  Laterality: N/A;  . PERIPHERAL VASCULAR CATHETERIZATION Bilateral 07/10/2016   Procedure: Lower Extremity Angiography;  Surgeon: Conrad Aguadilla, MD;  Location: [place] CV LAB;  Service: Cardiovascular;  Laterality: Bilateral;  . TONSILLECTOMY    . ULTRASOUND GUIDANCE FOR VASCULAR ACCESS Bilateral 07/16/2016   Procedure: ULTRASOUND GUIDANCE FOR VASCULAR ACCESS of right common femoral and left dorsalis pedis;  Surgeon: Waynetta Sandy, MD;  Location: [facility] OR;  Service: Vascular;  Laterality: Bilateral;  . WOUND DEBRIDEMENT Left 07/31/2016   Procedure: DEBRIDEMENT GREAT TOE AMPUTATION SITE;  Surgeon: Serafina Mitchell, MD;  Location: [facility] OR;  Service: Vascular;  Laterality: Left;    Family History  Problem Relation Age of Onset  . Heart attack Father 45  . Heart disease Father     Before age 43  . Hypertension Father   . Stroke Mother 10  . Cancer Sister     Cervical  . Heart attack Sister   . Heart disease Sister   . Diabetes Sister     2 sister w/ DM  . Heart attack Sister   . Heart attack Sister   . Heart disease Sister 63    AAA  and  Stomach Aneurysm  . AAA (abdominal aortic aneurysm) Sister   . Heart disease Sister     Before age 56  . Heart attack Sister 13  . Colon cancer Neg Hx    . Prostate cancer Neg Hx     SOCIAL HISTORY: Social History   Social History  . Marital status: Married    Spouse name: N/A  . Number of children: 2  . Years of education: N/A   Occupational History  . retired- long distance Administrator  Retired   Social History Main Topics  . Smoking status: Former Smoker    Types: Cigarettes    Quit date: 06/23/1988  . Smokeless tobacco: Former Systems developer  . Alcohol use No  . Drug use: No  . Sexual activity: Not Currently   Other Topics Concern  . Not on file   Social History Narrative   Diet: healthy most of the time--   2 step children  Patient drinks about 3-4 cups of caffeine daily.   Patient is right handed.              Allergies  Allergen Reactions  . Potassium-Containing Compounds Anaphylaxis    IV--loss of conciousness  . Metoprolol Succinate Other (See Comments)    Extremely tired    Current Facility-Administered Medications  Medication Dose Route Frequency Provider Last Rate Last Dose  . 0.9 %  sodium chloride infusion  250 mL Intravenous PRN Beverely Low, MD      . bisacodyl (DULCOLAX) suppository 10 mg  10 mg Rectal Daily PRN Cherene Altes, MD      . chlorhexidine gluconate (MEDLINE KIT) (PERIDEX) 0.12 % solution 15 mL  15 mL Mouth Rinse BID Jose Shirl Harris, MD   15 mL at 09/29/16 0835  . Chlorhexidine Gluconate Cloth 2 % PADS 6 each  6 each Topical Daily [place], MD   6 each at 09/29/16 [phone]  . collagenase (SANTYL) ointment   Topical Daily Cherene Altes, MD      . donepezil (ARICEPT) tablet 10 mg  10 mg Per Tube QHS [place], MD   10 mg at 09/28/16 2226  . fentaNYL (SUBLIMAZE) bolus via infusion 25 mcg  25 mcg Intravenous Q1H PRN Colbert Coyer, MD   25 mcg at 09/29/16 0420  . fentaNYL 2554mg in NS 2561m(1060mml) infusion-PREMIX  25-400 mcg/hr Intravenous Continuous EliGuadelupe Sabinterding, MD 7.5 mL/hr at 09/29/16 1100 75 mcg/hr at 09/29/16 1100  . furosemide  (LASIX) injection 80 mg  80 mg Intravenous TID Jose Angelo A de Dios, MD      . heparin ADULT infusion 100 units/mL (25000 units/250m52mdium chloride 0.45%)  1,050 Units/hr Intravenous Continuous Jose Angelo A de Dios, MD 10.5 mL/hr at 09/29/16 1100 1,050 Units/hr at 09/29/16 1100  . insulin aspart (novoLOG) injection 0-9 Units  0-9 Units Subcutaneous Q4H ElizColbert Coyer   1 Units at 09/29/16 1212  . ipratropium-albuterol (DUONEB) 0.5-2.5 (3) MG/3ML nebulizer solution 3 mL  3 mL Nebulization Q2H PRN TimoVianne Bulls   3 mL at 09/10/2016 2225  . lidocaine (XYLOCAINE) 1 % (with pres) injection    PRN KaciDocia Barrier   10 mL at 09/25/16 0929  . LORazepam (ATIVAN) injection 1 mg  1 mg Intravenous Once MaryRitta Slot      . MEDLINE mouth rinse  15 mL Mouth Rinse QID Jose AngeShirl Harris   15 mL at 09/29/16 1220  . metoprolol tartrate (LOPRESSOR) 25 mg/10 mL oral suspension 25 mg  25 mg Per Tube BID JoseMalden-[place]   25 mg at 09/29/16 [id number] (VERSED) injection 1 mg  1 mg Intravenous Q15 min PRN ArunBeverely Low      . midazolam (VERSED) injection 1-2 mg  1-2 mg Intravenous Q2H PRN Bethany Molt, DO   2 mg at 09/28/16 2020  . multivitamin liquid 15 mL  15 mL Per Tube Daily Jose AngeShirl Harris   15 mL at 09/29/16 [id number] (MYCOSTATIN/NYSTOP) topical powder   Topical BID TimoIlene Quad, MD      . ondansetron (ZOFBraselton Endoscopy Center LLCjection 4 mg  4 mg Intravenous Q6H PRN TimoVianne Bulls      . pantoprazole (PROTONIX) injection 40 mg  40 mg Intravenous Q24H ElizColbert Coyer  40 mg at 09/29/16 0926  . polyethylene glycol (MIRALAX / GLYCOLAX) packet 17 g  17 g Oral Daily Mariel Aloe, MD   17 g at 09/29/16 0926  . potassium chloride 20 MEQ/15ML (10%) solution 60 mEq  60 mEq Per Tube Once Norman Herrlich, MD      . QUEtiapine (SEROQUEL) tablet 50 mg  50 mg Per Tube QHS [place], MD   50 mg at 09/28/16 2226  . RESOURCE THICKENUP CLEAR    Oral BID AC & HS Jennifer Chahn-Yang Choi, DO      . sodium chloride flush (NS) 0.9 % injection 10-40 mL  10-40 mL Intracatheter Q12H Jose Shirl Harris, MD   10 mL at 09/29/16 [phone]  . sodium chloride flush (NS) 0.9 % injection 10-40 mL  10-40 mL Intracatheter PRN Jose Shirl Harris, MD        ROS:   Unable to obtain  Physical Examination  Vitals:   09/29/16 1230 09/29/16 1300 09/29/16 1330 09/29/16 1400  BP:  (!) 114/56 (!) 108/49 (!) 109/52  Pulse: 68 (!) 51 (!) 30 63  Resp: 17 16 16 16   Temp:      TempSrc:      SpO2: 100% 100% 100% 100%  Weight:      Height:        Body mass index is 24.89 kg/m.  General:  Alert and oriented, no acute distress Skin: No rash, right heel 7 cm gangrenous wound dry, left foot mottled Extremity Pulses: 2+ femoral pulses bilaterally, right DP doppler, no left pedal signals, left leg cool to mid thigh Musculoskeletal: No deformity or edema  Neurologic:  Pt agitated moves all extremities but not purposeful  DATA:  CBC    Component Value Date/Time   WBC 9.5 09/29/2016 0424   RBC 3.54 (L) 09/29/2016 0424   HGB 9.1 (L) 09/29/2016 0424   HGB 14.1 11/22/2010 0953   HCT 31.1 (L) 09/29/2016 0424   HCT 42.6 11/22/2010 0953   PLT 170 09/29/2016 0424   PLT 223 11/22/2010 0953   MCV 87.9 09/29/2016 0424   MCV 82 11/22/2010 0953   MCH 25.7 (L) 09/29/2016 0424   MCHC 29.3 (L) 09/29/2016 0424   RDW 17.7 (H) 09/29/2016 0424   RDW 16.3 (H) 11/22/2010 0953   LYMPHSABS 1.4 09/20/2016 0205   LYMPHSABS 2.4 11/22/2010 0953   MONOABS 1.4 (H) 09/20/2016 0205   EOSABS 0.0 09/20/2016 0205   EOSABS 0.4 11/22/2010 0953   BASOSABS 0.0 09/20/2016 0205   BASOSABS 0.1 11/22/2010 0953    BMET    Component Value Date/Time   NA 146 (H) 09/29/2016 1200   NA 139 08/29/2016   K 2.8 (L) 09/29/2016 1200   CL 101 09/29/2016 1200   CO2 38 (H) 09/29/2016 1200   GLUCOSE 142 (H) 09/29/2016 1200   GLUCOSE 229 08/07/2008 0000   BUN 17 09/29/2016 1200   BUN  36 (A) 08/29/2016   CREATININE 0.88 09/29/2016 1200   CREATININE 1.17 03/19/2016 1534   CALCIUM 9.0 09/29/2016 1200   GFRNONAA >60 09/29/2016 1200   GFRAA >60 09/29/2016 1200   Albumin 1.8  ASSESSMENT:  Pt currently with vent dependent respiratory failure.  His left leg is not salvageable with no further revascularization options.  I do not believe it is currently making him sick but it will over the next 1-2 days.  He also has gangrenous right heel and is high risk for limb  loss on that side as well   PLAN:  Have discussed with critical care service.  They will have discussions with wife regarding goals of care in light of the fact that he is currently ventilated and agitated with what appears to be combined CHF pneumonia.  If they wish to pursue aggressive course he will need left above knee amputation.   Please call me if they wish to pursue amputation.  Otherwise will recheck tomorrow.    Ruta Hinds, MD [facility] Office: [phone] Pager: [phone]

## 2016-09-29 NOTE — Progress Notes (Signed)
K 2.5. Ordered 63mEq Kcl 10% soln once.

## 2016-09-29 NOTE — Plan of Care (Signed)
Problem: Education: Goal: Knowledge of [place] Education information/materials will improve Outcome: Not Progressing Patient non-verbal and with limited understanding of conversation. Intubated. Will continue to assess for opportunity to educate.

## 2016-09-29 NOTE — Progress Notes (Signed)
Updated wife at beside, including recommendation for AKA if aggressive treatment is pursued. Wife states that she does not want to prolong her husbands misery if he looks like he needs to be intubated for a long time. I discussed with her that he has been intubated for one day and we are currently addressing underlying factors that led to his intubation and would limit his ability to extubate.  Wife will discuss with daughter about pursuing amputation as well as general goals of care.   Wife will be visiting tomorrow afternoon but can be reached at her cell phone number (in chart) in the morning for discussion.   Alphonzo Grieve, MD PCCM - PGY1

## 2016-09-29 NOTE — Progress Notes (Signed)
BP remains low even after the previous 250 cc bolus. Patient is agitated as hypotension is limiting the sedative use. Continues to diurese well - will bolus another 250cc  - switch fentanyl gtt to precedex gtt with prn fentanyl.

## 2016-09-30 ENCOUNTER — Inpatient Hospital Stay (HOSPITAL_COMMUNITY): Payer: Medicare Other

## 2016-09-30 DIAGNOSIS — I509 Heart failure, unspecified: Secondary | ICD-10-CM

## 2016-09-30 DIAGNOSIS — I739 Peripheral vascular disease, unspecified: Secondary | ICD-10-CM

## 2016-09-30 DIAGNOSIS — Z515 Encounter for palliative care: Secondary | ICD-10-CM

## 2016-09-30 DIAGNOSIS — Z66 Do not resuscitate: Secondary | ICD-10-CM

## 2016-09-30 LAB — GLUCOSE, CAPILLARY
GLUCOSE-CAPILLARY: 141 mg/dL — AB (ref 65–99)
GLUCOSE-CAPILLARY: 155 mg/dL — AB (ref 65–99)
Glucose-Capillary: 137 mg/dL — ABNORMAL HIGH (ref 65–99)
Glucose-Capillary: 117 mg/dL — ABNORMAL HIGH (ref 65–99)

## 2016-09-30 LAB — CULTURE, BODY FLUID W GRAM STAIN -BOTTLE: Culture: NO GROWTH

## 2016-09-30 LAB — ECHOCARDIOGRAM COMPLETE
HEIGHTINCHES: 70 in
WEIGHTICAEL: 2747.81 [oz_av]

## 2016-09-30 LAB — BASIC METABOLIC PANEL
ANION GAP: 9 (ref 5–15)
BUN: 12 mg/dL (ref 6–20)
CALCIUM: 8.7 mg/dL — AB (ref 8.9–10.3)
CO2: 36 mmol/L — AB (ref 22–32)
CREATININE: 0.85 mg/dL (ref 0.61–1.24)
Chloride: 102 mmol/L (ref 101–111)
GFR calc Af Amer: >60 mL/min (ref >60)
GFR calc non Af Amer: >60 mL/min (ref >60)
GLUCOSE: 139 mg/dL — AB (ref 65–99)
Potassium: 3.2 mmol/L — ABNORMAL LOW (ref 3.5–5.1)
Sodium: 147 mmol/L — ABNORMAL HIGH (ref 135–145)

## 2016-09-30 LAB — CBC
HCT: 29 % — ABNORMAL LOW (ref 39.0–52.0)
Hemoglobin: 8.5 g/dL — ABNORMAL LOW (ref 13.0–17.0)
MCH: 25.8 pg — AB (ref 26.0–34.0)
MCHC: 29.3 g/dL — AB (ref 30.0–36.0)
MCV: 88.1 fL (ref 78.0–100.0)
Platelets: 164 10*3/uL (ref 150–400)
RBC: 3.29 MIL/uL — ABNORMAL LOW (ref 4.22–5.81)
RDW: 18.4 % — AB (ref 11.5–15.5)
WBC: 8.4 10*3/uL (ref 4.0–10.5)

## 2016-09-30 LAB — HEPARIN LEVEL (UNFRACTIONATED)
Heparin Unfractionated: 0.13 IU/mL — ABNORMAL LOW (ref 0.30–0.70)
Heparin Unfractionated: 0.22 IU/mL — ABNORMAL LOW (ref 0.30–0.70)

## 2016-09-30 LAB — MAGNESIUM: MAGNESIUM: 1.5 mg/dL — AB (ref 1.7–2.4)

## 2016-09-30 LAB — PHOSPHORUS: PHOSPHORUS: 1.7 mg/dL — AB (ref 2.5–4.6)

## 2016-09-30 MED ORDER — DEXTROSE 5 % IV SOLN
INTRAVENOUS | Status: DC
  Administered 2016-09-30: 50 mL via INTRAVENOUS

## 2016-09-30 MED ORDER — MORPHINE SULFATE (PF) 2 MG/ML IV SOLN
2.0000 mg | Freq: Four times a day (QID) | INTRAVENOUS | Status: DC
  Administered 2016-09-30 – 2016-10-01 (×4): 2 mg via INTRAVENOUS
  Filled 2016-09-30 (×3): qty 1

## 2016-09-30 MED ORDER — METOPROLOL TARTRATE 25 MG/10 ML ORAL SUSPENSION
6.2500 mg | Freq: Two times a day (BID) | ORAL | Status: DC
  Administered 2016-09-30: 6.25 mg
  Filled 2016-09-30: qty 5

## 2016-09-30 MED ORDER — SODIUM CHLORIDE 0.9 % IV SOLN
INTRAVENOUS | Status: DC
  Administered 2016-09-30: 10 mL/h via INTRAVENOUS

## 2016-09-30 MED ORDER — LORAZEPAM 2 MG/ML IJ SOLN: 2.0000 mg | INTRAMUSCULAR | Status: DC | PRN

## 2016-09-30 MED ORDER — FUROSEMIDE 10 MG/ML IJ SOLN
40.0000 mg | Freq: Two times a day (BID) | INTRAMUSCULAR | Status: DC
  Administered 2016-09-30 – 2016-10-03 (×8): 40 mg via INTRAVENOUS
  Filled 2016-09-30 (×8): qty 4

## 2016-09-30 MED ORDER — DEXMEDETOMIDINE HCL IN NACL 400 MCG/100ML IV SOLN
0.4000 ug/kg/h | INTRAVENOUS | Status: DC
  Administered 2016-09-30: 1 ug/kg/h via INTRAVENOUS
  Administered 2016-09-30 (×3): 0.6 ug/kg/h via INTRAVENOUS
  Administered 2016-10-01: 1.2 ug/kg/h via INTRAVENOUS
  Administered 2016-10-01: 0.6 ug/kg/h via INTRAVENOUS
  Administered 2016-10-01: 1.2 ug/kg/h via INTRAVENOUS
  Administered 2016-10-02: 0.6 ug/kg/h via INTRAVENOUS
  Administered 2016-10-02: 0.8 ug/kg/h via INTRAVENOUS
  Administered 2016-10-03: 0.6 ug/kg/h via INTRAVENOUS
  Filled 2016-09-30 (×11): qty 100

## 2016-09-30 MED ORDER — MORPHINE SULFATE (PF) 2 MG/ML IV SOLN
1.0000 mg | INTRAVENOUS | Status: DC | PRN
  Filled 2016-09-30: qty 1

## 2016-09-30 MED ORDER — POTASSIUM CHLORIDE 20 MEQ/15ML (10%) PO SOLN
40.0000 meq | Freq: Every day | ORAL | Status: DC
  Administered 2016-09-30 (×2): 40 meq
  Filled 2016-09-30: qty 30

## 2016-09-30 NOTE — Progress Notes (Signed)
Responded to Spiritual Care Consult to provide support to patient, family and staff.  Spoke with pt.'s nuse who said family had gone for evening after a long meeting with Palliative care. Patient unable to communicate.  Chaplain available as needed.   Jaclynn Major, [place]

## 2016-09-30 NOTE — Progress Notes (Addendum)
PULMONARY  / CRITICAL CARE MEDICINE  Name: [name]. MRN: [id number] DOB: [birth year]    LOS: 68  REFERRING MD :  Dr. Harvie Bridge  CHIEF COMPLAINT:  Respiratory failure  BRIEF PATIENT DESCRIPTION: [name]. is a 82 y.o. male COPD, acute on chronic HFpEF, CAD status post CABG, aortic valve replacement and pacemaker placement, chronic atrial fibrillation on Coumadin who initially presented on 09/18/2016 with altered mentation in the setting of accidental medication overdose (ativan, haldol and benadryl). His mental status gradually improved through the past week. CXR showed bilateral pleural effusions and signs of pulmonary edema. He underwent thoracentesis 4/5 morning with removal of 1.4L of transudative fluid. For the past couple of days he has been getting furosemide therapy. This morning, he was hypoxic on monitor with altered mental status and Code Blue was called for intubation. After arrival in MICU, patient was responsive and moving all extremities while intubated.  LINES / TUBES: PIC 4/3 >> PICC 4/8>>  CULTURES: Rt effusion culture- 4/5 >> NGTD  ANTIBIOTICS: unasyn 3/30-4/4  Imaging:  CXR 4/5>> no change in diffuse pulmonary edema and b/l pleural effusions  ECHO 4/8>> EF 55-60%, ventricular septal showed paradox motion consistent with RV pressure overload, PA peak pressure 60mmHg  LEVEL OF CARE:  ICU PRIMARY SERVICE:  PCCM CODE STATUS: FULL DIET:  NPO DVT Px:  Coumadin per afib GI Px:  protonix 40mg  per tube    PAST MEDICAL HISTORY :  Past Medical History:  Diagnosis Date  . Aortal stenosis 2006   biophrostetic  . Arthritis   . Atrial fibrillation ([place])    onset after CABG--- coumadin management at Coumadin clinic  . Carotid artery disease ([place]) 09/27/2010   Carotid US 12/17: bilat ICA 40-59 >> 1 year FU  . CHF (congestive heart failure) ([place])    abter CABG  . Complete heart block ([place])   . Coronary artery disease   . DM type 2 (diabetes  mellitus, type 2) ([place])    used to see Dr Meredith Pel, now f/u by Dr Larose Kells  . Erectile dysfunction   . Gangrene ([place])   . History of nephrolithiasis   . HTN (hypertension)   . Hyperlipidemia   . Memory loss   . Obesity   . OSA (obstructive sleep apnea)    not using CPAP  . Pacemaker 2/07   VVI  . PVD (peripheral vascular disease) ([name])    CT angio showed- vascular insuff, intestine and RAS bilaterally, andgiogram 02/2009 severe PVD medical management  . S/P aortic valve replacement   . Sick sinus syndrome ([name])    [name]  . Skin cancer    surgery (nose) 09-2010   Past Surgical History:  Procedure Laterality Date  . ABDOMINAL AORTAGRAM N/A 11/10/2012   Procedure: ABDOMINAL Maxcine Ham;  Surgeon: Serafina Mitchell, MD;  Location: [facility] CATH LAB;  Service: Cardiovascular;  Laterality: N/A;  . AMPUTATION  01/2010   great toe  . AMPUTATION Left 07/16/2016   Procedure: RECECTION OF LEFT GREAT TOE METATARSAL HEAD and sesamoid;  Surgeon: Waynetta Sandy, MD;  Location: [place];  Service: Vascular;  Laterality: Left;  . AMPUTATION Left 07/31/2016   Procedure: SECOND TOE AMPUTATION;  Surgeon: Serafina Mitchell, MD;  Location: [facility] OR;  Service: Vascular;  Laterality: Left;  . ANGIOPLASTY / STENTING FEMORAL  02/10/10   Left femoral  . ANGIOPLASTY ILLIAC ARTERY Left 07/16/2016   Procedure: drug coated Balloon ANGIOPLASTY of left superficial femoral artry stent;  Surgeon: Waynetta Sandy, MD;  Location: [place];  Service: Vascular;  Laterality: Left;  . AORTIC VALVE REPLACEMENT     biophrostetic for Ao Stenosis 2006  . AORTIC VALVE REPLACEMENT  01/17/2011   S/P redo median sternotomy, extracorporeal circulation, redo Aortic Valve Replacement using a 23-mm Edwards pericardial Magna-Ease valve, Dr Ulla Gallo  . AORTOGRAM N/A 07/16/2016   Procedure: left lower extremity angiogram;  Surgeon: Waynetta Sandy, MD;  Location: [place];  Service: Vascular;  Laterality: N/A;  .  APPLICATION OF WOUND VAC Left 07/31/2016   Procedure: APPLICATION OF WOUND VAC;  Surgeon: Serafina Mitchell, MD;  Location: [place];  Service: Vascular;  Laterality: Left;  . CATARACT EXTRACTION, BILATERAL    . CORONARY ARTERY BYPASS GRAFT  2006  . EP IMPLANTABLE DEVICE N/A 06/06/2016   MDT Generator change with Advisa SR MRI device implanted by Dr Rayann Heman  . ESOPHAGOGASTRODUODENOSCOPY (EGD) WITH PROPOFOL N/A 08/07/2016   Procedure: ESOPHAGOGASTRODUODENOSCOPY (EGD) WITH PROPOFOL;  Surgeon: Mauri Pole, MD;  Location: [facility];  Service: Endoscopy;  Laterality: N/A;  . EYE SURGERY    . ILIAC ATHERECTOMY Left 07/16/2016   Procedure: Angioplasty and ATHERECTOMY of left anterior tibial artery;  Surgeon: Waynetta Sandy, MD;  Location: [place];  Service: Vascular;  Laterality: Left;  . INCISE AND DRAIN ABCESS Right May 2014   right heel   . IR THORACENTESIS ASP PLEURAL SPACE W/IMG GUIDE  09/25/2016  . LOWER EXTREMITY ANGIOGRAM Left 11/23/2012   Procedure: LOWER EXTREMITY ANGIOGRAM;  Surgeon: Serafina Mitchell, MD;  Location: [facility] CATH LAB;  Service: Cardiovascular;  Laterality: Left;  . PACEMAKER PLACEMENT  07/2005   Medtronic Sigma SR implanted by Dr Olevia Perches  . PERIPHERAL VASCULAR CATHETERIZATION N/A 07/10/2016   Procedure: Abdominal Aortogram;  Surgeon: Conrad Gypsy, MD;  Location: [place] CV LAB;  Service: Cardiovascular;  Laterality: N/A;  . PERIPHERAL VASCULAR CATHETERIZATION Bilateral 07/10/2016   Procedure: Lower Extremity Angiography;  Surgeon: Conrad [place], MD;  Location: [place] CV LAB;  Service: Cardiovascular;  Laterality: Bilateral;  . TONSILLECTOMY    . ULTRASOUND GUIDANCE FOR VASCULAR ACCESS Bilateral 07/16/2016   Procedure: ULTRASOUND GUIDANCE FOR VASCULAR ACCESS of right common femoral and left dorsalis pedis;  Surgeon: Waynetta Sandy, MD;  Location[facility] OR;  Service: Vascular;  Laterality: Bilateral;  . WOUND DEBRIDEMENT Left 07/31/2016   Procedure: DEBRIDEMENT GREAT  TOE AMPUTATION SITE;  Surgeon: Serafina Mitchell, MD;  Location: [facility] OR;  Service: Vascular;  Laterality: Left;   Prior to Admission medications   Medication Sig Start Date End Date Taking? Authorizing Provider  acetaminophen (TYLENOL) 325 MG tablet Take 650 mg by mouth 3 (three) times daily. 6AM, 2PM, 10PM   Yes Historical Provider, MD  albuterol (PROVENTIL) (2.5 MG/3ML) 0.083% nebulizer solution Take 3 mLs (2.5 mg total) by nebulization every 4 (four) hours as needed for wheezing or shortness of breath. 08/08/16  Yes Albertine Patricia, MD  ampicillin (PRINCIPEN) 500 MG capsule Take 500 mg by mouth every 6 (six) hours. 10 day course ordered 09/12/16 for wound infection in left foot   Yes Historical Provider, MD  aspirin EC 81 MG EC tablet Take 1 tablet (81 mg total) by mouth daily. 07/22/16  Yes Oswald Hillock, MD  diphenhydrAMINE (BENADRYL) 25 MG tablet Take 25 mg by mouth See admin instructions. Take 1 tablet (25 mg) by mouth every 8 hours for allergic reaction - order date 09/10/16   Yes Historical Provider, MD  docusate sodium (COLACE) 100 MG capsule Take 1 capsule (100 mg total) by mouth 2 (two) times daily. 08/08/16  Yes Albertine Patricia, MD  donepezil (ARICEPT) 10 MG tablet Take 1 tablet (10 mg total) by mouth at bedtime. 05/08/16  Yes Kathrynn Ducking, MD  ezetimibe-simvastatin (VYTORIN) 10-40 MG per tablet Take 1 tablet by mouth at bedtime. 05/06/12  Yes Larey Dresser, MD  ferrous sulfate 325 (65 FE) MG tablet Take 1 tablet (325 mg total) by mouth 2 (two) times daily with a meal. 08/08/16  Yes Albertine Patricia, MD  furosemide (LASIX) 40 MG tablet Take 1 tablet (40 mg total) by mouth 2 (two) times daily. 07/21/16  Yes Oswald Hillock, MD  insulin glargine (LANTUS) 100 UNIT/ML injection Inject 0.05 mLs (5 Units total) into the skin at bedtime. 08/08/16  Yes Silver Huguenin Elgergawy, MD  insulin lispro (HUMALOG) 100 UNIT/ML injection Inject 0-9 Units into the skin See admin instructions. Inject 1-9 unit  subcutaneously three time daily with meals per sliding scale: CBG <70 initiate hypoglycemic protocol, 70-120 0 units, 121-150 1 unit, 151-200 2 units, 201-250 3 units, 251-300 5 units, 301-350 7 units, 351-400 9 units, >400 Call MD   Yes Historical Provider, MD  ipratropium (ATROVENT) 0.02 % nebulizer solution Take 2.5 mLs (0.5 mg total) by nebulization every 4 (four) hours as needed for wheezing or shortness of breath. 08/08/16  Yes Albertine Patricia, MD  metoprolol tartrate (LOPRESSOR) 25 MG tablet Take 1 tablet (25 mg total) by mouth 2 (two) times daily. 08/08/16  Yes Albertine Patricia, MD  Multiple Vitamins-Minerals (DECUBI-VITE) CAPS Take 1 capsule by mouth daily.   Yes Historical Provider, MD  nystatin (MYCOSTATIN/NYSTOP) powder Apply topically See admin instructions. Apply topically to groin three times daily for irritation   Yes Historical Provider, MD  OXYGEN Inhale 2 L into the lungs continuous.   Yes Historical Provider, MD  pantoprazole (PROTONIX) 40 MG tablet Take 1 tablet (40 mg total) by mouth 2 (two) times daily. 08/08/16  Yes Silver Huguenin Elgergawy, MD  potassium chloride SA (K-DUR,KLOR-CON) 20 MEQ tablet Take 20 mEq by mouth 2 (two) times daily.   Yes Historical Provider, MD  PRESCRIPTION MEDICATION Take 1 mL by mouth See admin instructions. Gel compounded at [facility]: Ativan 1 mg/Benadryl 25 mg/ Haldol 1 mg - per ml - apply 1 ml topically twice daily for anxiety/paranoia   Yes Historical Provider, MD  QUEtiapine (SEROQUEL) 50 MG tablet Take 50 mg by mouth at bedtime.   Yes Historical Provider, MD  warfarin (COUMADIN) 2.5 MG tablet Take 2.5 mg by mouth daily.    Historical Provider, MD    INTERVAL HISTORY: Yesterday left LE noted to be mottled and purple, unable to get pulses w/ doppler. Vasc sx consulted and recommend AKA if family pursues aggressive treatment. Low blood pressures in the SBP 80's, he was given 250 NS bolus.   VITAL  SIGNS: Temp:  [98.3 F (36.8 C)-99 F (37.2 C)] 98.6 F (37 C) (04/09 2312) Pulse Rate:  [30-86] 64 (04/10 0700) Resp:  [13-31] 20 (04/10 0700) BP: (70-154)/(29-103) 135/63 (04/10 0700) SpO2:  [97 %-100 %] 100 % (04/10 0700) FiO2 (%):  [30 %-40 %] 30 % (04/10 0341) Weight:  [171 lb 11.8 oz (77.9 kg)] 171 lb 11.8 oz (77.9 kg) (04/10 0500) HEMODYNAMICS: CVP:  [2 mmHg] 2 mmHg VENTILATOR SETTINGS: Vent Mode: PRVC FiO2 (%):  [30 %-40 %] 30 %  Set Rate:  [16 bmp] 16 bmp Vt Set:  [580 mL] 580 mL PEEP:  [5 cmH20] 5 cmH20 Plateau Pressure:  [9 cmH20-28 cmH20] 18 cmH20 INTAKE / OUTPUT: Intake/Output      04/09 0701 - 04/10 0700 04/10 0701 - 04/11 0700   I.V. (mL/kg) 651.4 (8.4)    Other 0    NG/GT 120    Total Intake(mL/kg) 771.4 (9.9)    Urine (mL/kg/hr) 4150 (2.2)    Emesis/NG output 0 (0)    Total Output 4150     Net -3378.6            PHYSICAL EXAMINATION: General:  Elderly, NAD, sedated Neuro:  RASS -2, no gag reflex on suction but did bite on tube HEENT:  ETT, moist mucous membranes Cardiovascular:  Bradycardic, LLL sternal murmur  Lungs:  CTAB, no wheezing or crackles Abdomen:  Non TTP, soft Ext: Left lower leg cold, unable to palpate pulse, wound dressings dry. Rt leg warm, unable to palpate pulse but warm and perfused    LABS: Cbc  Recent Labs Lab 09/28/16 0858 09/29/16 0424 09/30/16 0536  WBC 10.3 9.5 8.4  HGB 8.8* 9.1* 8.5*  HCT 31.3* 31.1* 29.0*  PLT 164 170 164    Chemistry   Recent Labs Lab 09/23/16 1923  09/25/16 0236  09/28/16 0858  09/29/16 1200 09/29/16 1949 09/30/16 0536  NA 145  < > 142  < > 144  < > 146* 147* 147*  K 5.8*  < > 3.3*  < > 3.5  < > 2.8* 3.5 3.2*  CL 101  < > 100*  < > 99*  < > 101 102 102  CO2 30  < > 33*  < > 37*  < > 38* 37* 36*  BUN 53*  < > 44*  < > 26*  < > 17 15 12   CREATININE 1.62*  < > 1.46*  < > 0.98  < > 0.88 0.91 0.85  CALCIUM 5.4*  < > 8.9  < > 9.1  < > 9.0 9.0 8.7*  MG 1.1*  < > 2.0  --  1.7  --   --    --  1.5*  PHOS 3.5  --   --   --  1.9*  --   --   --  1.7*  GLUCOSE 165*  < > 154*  < > 195*  < > 142* 125* 139*  < > = values in this interval not displayed.  Liver fxn  Recent Labs Lab 09/25/16 0236 09/28/16 0140  AST  --  64*  ALT  --  59  ALKPHOS  --  284*  BILITOT  --  0.5  PROT 6.0* 6.5  ALBUMIN  --  1.8*   coags  Recent Labs Lab 09/29/16 1200  INR 1.44   Sepsis markers No results for input(s): LATICACIDVEN, PROCALCITON in the last 168 hours. Cardiac markers  Recent Labs Lab 09/28/16 0140  TROPONINI 0.09*   BNP No results for input(s): PROBNP in the last 168 hours. ABG  Recent Labs Lab 09/23/16 1206 09/28/16 0140  PHART 7.438 7.327*  PCO2ART 52.5* 66.6*  PO2ART 134* 269*  HCO3 35.0* 33.9*    CBG trend  Recent Labs Lab 09/29/16 0807 09/29/16 1131 09/29/16 1556 09/29/16 1927 09/29/16 2315  GLUCAP 103* 128* 103* 120* 115*    IMAGING:  ECG:  DIAGNOSES: Principal Problem:   Overdose, accidental or unintentional, initial encounter Active Problems:   DM (diabetes mellitus) type II  uncontrolled, periph vascular disorder ([name])   Obstructive sleep apnea   Essential hypertension   CAD (coronary artery disease)   Chronic atrial fibrillation ([name])   Pleural effusion, left   Anemia   Chronic respiratory failure with hypoxia ([name])   AKI (acute kidney injury) ([place])   Accidental overdose   Acute on chronic respiratory failure with hypoxia ([name])   Pressure injury of skin   Protein-calorie malnutrition, severe   ASSESSMENT / PLAN:  PULMONARY A: Acute hypoxic respiratory failure with pulmonary edema and pleural effusions On home 2L O2 Recurrent rt pleural effusion s/p thoracentesis --4/5 yielded 1.4L clear, yellow fluid; also thoracentesis on 3/31 COPD Severe pulmonary htn, CPAP qhs at home P:   Will continue mechanical ventilation VAP bundle and lung protective ventilation  CARDIOVASCULAR A:  HFpEF, s/p aortic valve replacement,  s/p CABG PPM Chronic atrial fibrillation PVD P:  Hold IV diuresis 2/2 low BP overnight, pt has severe pHTN and is preload dependant, caution for over diuresis  ECHO ordered, consider HF consult Start on anticoagulation, hgb at b/l (8-9) LLE ischemia, needs AKA, palliative care to meet w/ family. Appreciate recommendations.  Rt heel gangrene that is at high risk for limb loss, Vascular sx following.     RENAL A:   CKD P:   Tolerating diuresis with improving creatinine K 3.5, getting KCl 66meq BID   GASTROINTESTINAL A:   Mild abdominal pain P:   Will monitor OG tube, consider TF tomorrow if still intubated  HEMATOLOGIC A:   Systemic anticoagulation, held due to high bleeding risk P:  IV hep gtt   INFECTIOUS A:   Multiple skin wounds/ulcers Low suspicion for pulmonary infection  P:   Received 6 days of unasyn for possible asp pna Wound care following Needs AKA for LLE ischemia  ENDOCRINE A:   Diabetes mellitus P:   SSI   NEUROLOGIC A:   Acute encephalopathy in setting of hypoxemia, nonfocal exam. Consideration of TIA P:   Fentanyl and dexmedetomidine while intubated to use for RASS goal 0 Palliative care consulted, appreciate recommendations   Julious Oka, MD Internal Medicine Resident, PGY [facility] Pager: [phone]    09/30/2016, 7:35 AM   STAFF NOTE: I, Merrie Roof, MD FACP have personally reviewed patient's available data, including medical history, events of note, physical examination and test results as part of my evaluation. I have discussed with resident/NP and other care providers such as pharmacist, RN and RRT. In addition, I personally evaluated patient and elicited key findings of:  rass -2, moves all ext,.has agitation, bites tube, no pulse left and cold, rt warm, pcxr show effusion rt slight better since admission and incrasing edema left, he has fem pulse left and pop awaited doppler,  vascular assessment recs AKA, abg reviewed keep same MV, weaning attempts daily SBT, was neg 3.3 liters and now pcxr increasing edema, would benefit from further diuresis low dose, and treat Na with d5w , bmet in am , K supp needed, hep drip remains, the overall picture is NOT good, he has , AS, severe Pa htn, PVD and from SNF with dementia, now in icu with MODS, ischemic limp, ARF, effusions, edema, this hospital stay is NOT survivable and if it is wold have no functional recovery or quality of life , I recommend full comfort care NOW as he is NOT benefiting from therapy and certainly not from AKA, have d/w pall care, WUA, precedex not sure if effective, may increase , if  to 1.2  And not responding may need to change The patient is critically ill with multiple organ systems failure and requires high complexity decision making for assessment and support, frequent evaluation and titration of therapies, application of advanced monitoring technologies and extensive interpretation of multiple databases.   Critical Care Time devoted to patient care services described in this note is 35 Minutes. This time reflects time of care of this signee: Merrie Roof, MD FACP. This critical care time does not reflect procedure time, or teaching time or supervisory time of PA/NP/Med student/Med Resident etc but could involve care discussion time. Rest per NP/medical resident whose note is outlined above and that I agree with   Lavon Paganini. Titus Mould, MD, [place] Pgr: [place] [facility] 09/30/2016 8:17 AM

## 2016-09-30 NOTE — Consult Note (Signed)
Consultation Note Date: 09/30/2016   Patient Name: [name].  DOB: 01-06-1935  MRN: [id number]  Age / Sex: 82 y.o., male  PCP: Colon Branch, MD Referring Physician: Rush Landmark, MD  Reason for Consultation: Establishing goals of care and Psychosocial/spiritual support  HPI/Patient Profile: 82 y.o. male   admitted on 09/14/2016 with a past medical history significant for COPD with chronic respiratory failure, chronic diastolic CHF, CAD status post CABG, chronic atrial fibrillation on Coumadin, intermittent agitation, and chronic wounds involving the left foot and sacrum who presents to the emergency department from his SNF for evaluation of somnolence following an accident abnormal overdose of Ativan, Haldol, and Benadryl.  ( per family)  Patient reportedly is treated at his nursing facility with a compounded topical cream containing 1 mg Ativan, 1 mg Haldol, and 25 mg Benadryl per dose. He typically receives this twice daily as needed for agitation, but was inadvertently given an oral dose today rather than topically.   Patient was noted to be increasingly somnolent at the nursing facility and EMS was called for transport to the hospital for evaluation of this.   His PMH for PVD is significant, has had multiple  Revascularizations procedures.  Vascular surgery was consulted yesterday 2/2 for cool mottled left leg, per notes left leg is not salvageable with no further revascularization options. He also has a right gangrenous heel.   Per family h/o of gradual physical and functional and cognitive decline, multiple falls at the facility, and nonoperative right humerus fracture.   Family face treatment option decisions, advanced directive decisions and anticipatory care needs.   Clinical Assessment and Goals of Care:  This NP Wadie Lessen reviewed medical records, received report from team,  assessed the patient and then meet at the patient's bedside along with his wife, two daughter, sister and two grandsons  to discuss diagnosis, prognosis, GOC, EOL wishes disposition and options.  Teres C#  [id number]             H# [phone]  Concept of Hospice and Palliative Care were discussed  A detailed discussion was had today regarding advanced directives.  Concepts specific to code status, artifical feeding and hydration, continued IV antibiotics and rehospitalization was had.  The difference between a aggressive medical intervention path  and a palliative comfort care path for this patient at this time was had.    Values and goals of care important to patient and family were attempted to be elicited. Family speak clearly to their "knowing" he would not want to live without his leg or in a SNF.  "He was a strong independent man, he was a truck driver, he would never want anyone to have to clean his bottom"    Natural trajectory and expectations at EOL were discussed.  We discussed with this shift in care to a comfort approach, that death could come at anytime.   Questions and concerns addressed.   Family encouraged to call with questions or concerns.  PMT will continue  to support holistically.   NEXT OF KIN/ wife is making decisions with tteh support of the family    SUMMARY OF RECOMMENDATIONS    1.  Family's main focus of care is comfort, "please don't let him suffer".   2.  Begin to wean life prolonging measures, no escalation of care 3.  Schedule Morphine for baseline pain 4.  Continue to utilize Presedex for agitation, add ativan prn 5.  Re-evaluate in the morning and if patient survives the next 24 hrs consider one way  extubation 6.  Palliative wound care 7.  Family request autopsy on death  Code Status/Advance Care Planning:  DNR- documented today  Wife cannot make a decision to liberate patient from the ventilator at this time. She understand that he may die  while on the ventilator and family is OK with that.     Palliative Prophylaxis:   Aspiration, Frequent Pain Assessment, Oral Care and Palliative Wound Care   Psycho-social/Spiritual:   Desire for further Chaplaincy support:yes         Cathleen Corti came to visit this afternoon from [employer]  Additional Recommendations: Grief/Bereavement Support  Prognosis:   < 2 weeks  Discharge Planning: Anticipated Hospital Death      Primary Diagnoses: Present on Admission: . Obstructive sleep apnea . Pleural effusion, left . Essential hypertension . DM (diabetes mellitus) type II uncontrolled, periph vascular disorder ([place]) . Chronic atrial fibrillation ([place]) . CAD (coronary artery disease) . Anemia . Chronic respiratory failure with hypoxia ([place]) . AKI (acute kidney injury) ([place]) . Overdose, accidental or unintentional, initial encounter . Accidental overdose   I have reviewed the medical record, interviewed the patient and family, and examined the patient. The following aspects are pertinent.  Past Medical History:  Diagnosis Date  . Aortal stenosis 2006   biophrostetic  . Arthritis   . Atrial fibrillation ([place])    onset after CABG--- [facility]mad[facility]  . Carotid artery disease ([place]) 09/27/2010   Carotid US 12/17: bilat ICA 40-59 >> 1 year FU  . CHF (congestive heart failure) ([place])    abter CABG  . Complete heart block ([place])   . Coronary artery disease   . DM type 2 (diabetes mellitus, type 2) ([place])    used to see Dr Meredith Pel, now f/u by Dr Larose Kells  . Erectile dysfunction   . Gangrene ([place])   . History of nephrolithiasis   . HTN (hypertension)   . Hyperlipidemia   . Memory loss   . Obesity   . OSA (obstructive sleep apnea)    not using CPAP  . Pacemaker 2/07   VVI  . PVD (peripheral vascular disease) ([name])    CT angio showed- vascular insuff, intestine and RAS bilaterally, andgiogram 02/2009 severe PVD medical management  . S/P aortic valve  replacement   . Sick sinus syndrome ([name])    [name]  . Skin cancer    surgery (nose) 09-2010   Social History   Social History  . Marital status: Married    Spouse name: N/A  . Number of children: 2  . Years of education: N/A   Occupational History  . retired- long distance Administrator  Retired   Social History Main Topics  . Smoking status: Former Smoker    Types: Cigarettes    Quit date: 06/23/1988  . Smokeless tobacco: Former Systems developer  . Alcohol use No  . Drug use: No  . Sexual activity: Not Currently   Other  Topics Concern  . None   Social History Narrative   Diet: healthy most of the time--   2 step children      Patient drinks about 3-4 cups of caffeine daily.   Patient is right handed.             Family History  Problem Relation Age of Onset  . Heart attack Father 90  . Heart disease Father     Before age 47  . Hypertension Father   . Stroke Mother 79  . Cancer Sister     Cervical  . Heart attack Sister   . Heart disease Sister   . Diabetes Sister     2 sister w/ DM  . Heart attack Sister   . Heart attack Sister   . Heart disease Sister 77    AAA  and  Stomach Aneurysm  . AAA (abdominal aortic aneurysm) Sister   . Heart disease Sister     Before age 56  . Heart attack Sister 5  . Colon cancer Neg Hx   . Prostate cancer Neg Hx    Scheduled Meds: . chlorhexidine gluconate (MEDLINE KIT)  15 mL Mouth Rinse BID  . Chlorhexidine Gluconate Cloth  6 each Topical Daily  . collagenase   Topical Daily  . donepezil  10 mg Per Tube QHS  . furosemide  40 mg Intravenous BID  . insulin aspart  0-9 Units Subcutaneous Q4H  . LORazepam  1 mg Intravenous Once  . mouth rinse  15 mL Mouth Rinse QID  . metoprolol tartrate  6.25 mg Per Tube BID  . multivitamin  15 mL Per Tube Daily  . nystatin   Topical BID  . pantoprazole (PROTONIX) IV  40 mg Intravenous Q24H  . polyethylene glycol  17 g Oral Daily  . potassium chloride  40 mEq Per Tube Daily    . QUEtiapine  50 mg Per Tube QHS  . sodium chloride flush  10-40 mL Intracatheter Q12H   Continuous Infusions: . dexmedetomidine (PRECEDEX) IV infusion 0.6 mcg/kg/hr (09/30/16 0700)  . dextrose 50 mL (09/30/16 0930)  . heparin 1,200 Units/hr (09/30/16 0900)   PRN Meds:.sodium chloride, bisacodyl, fentaNYL, ipratropium-albuterol, lidocaine, midazolam, midazolam, [DISCONTINUED] ondansetron **OR** ondansetron (ZOFRAN) IV, sodium chloride flush Medications Prior to Admission:  Prior to Admission medications   Medication Sig Start Date End Date Taking? Authorizing Provider  acetaminophen (TYLENOL) 325 MG tablet Take 650 mg by mouth 3 (three) times daily. 6AM, 2PM, 10PM   Yes Historical Provider, MD  albuterol (PROVENTIL) (2.5 MG/3ML) 0.083% nebulizer solution Take 3 mLs (2.5 mg total) by nebulization every 4 (four) hours as needed for wheezing or shortness of breath. 08/08/16  Yes Albertine Patricia, MD  ampicillin (PRINCIPEN) 500 MG capsule Take 500 mg by mouth every 6 (six) hours. 10 day course ordered 09/12/16 for wound infection in left foot   Yes Historical Provider, MD  aspirin EC 81 MG EC tablet Take 1 tablet (81 mg total) by mouth daily. 07/22/16  Yes Oswald Hillock, MD  diphenhydrAMINE (BENADRYL) 25 MG tablet Take 25 mg by mouth See admin instructions. Take 1 tablet (25 mg) by mouth every 8 hours for allergic reaction - order date 09/10/16   Yes Historical Provider, MD  docusate sodium (COLACE) 100 MG capsule Take 1 capsule (100 mg total) by mouth 2 (two) times daily. 08/08/16  Yes Silver Huguenin Elgergawy, MD  donepezil (ARICEPT) 10 MG tablet Take 1 tablet (10 mg total) by mouth  at bedtime. 05/08/16  Yes Kathrynn Ducking, MD  ezetimibe-simvastatin (VYTORIN) 10-40 MG per tablet Take 1 tablet by mouth at bedtime. 05/06/12  Yes Larey Dresser, MD  ferrous sulfate 325 (65 FE) MG tablet Take 1 tablet (325 mg total) by mouth 2 (two) times daily with a meal. 08/08/16  Yes Albertine Patricia, MD  furosemide  (LASIX) 40 MG tablet Take 1 tablet (40 mg total) by mouth 2 (two) times daily. 07/21/16  Yes Oswald Hillock, MD  insulin glargine (LANTUS) 100 UNIT/ML injection Inject 0.05 mLs (5 Units total) into the skin at bedtime. 08/08/16  Yes Silver Huguenin Elgergawy, MD  insulin lispro (HUMALOG) 100 UNIT/ML injection Inject 0-9 Units into the skin See admin instructions. Inject 1-9 unit subcutaneously three time daily with meals per sliding scale: CBG <70 initiate hypoglycemic protocol, 70-120 0 units, 121-150 1 unit, 151-200 2 units, 201-250 3 units, 251-300 5 units, 301-350 7 units, 351-400 9 units, >400 Call MD   Yes Historical Provider, MD  ipratropium (ATROVENT) 0.02 % nebulizer solution Take 2.5 mLs (0.5 mg total) by nebulization every 4 (four) hours as needed for wheezing or shortness of breath. 08/08/16  Yes Albertine Patricia, MD  metoprolol tartrate (LOPRESSOR) 25 MG tablet Take 1 tablet (25 mg total) by mouth 2 (two) times daily. 08/08/16  Yes Albertine Patricia, MD  Multiple Vitamins-Minerals (DECUBI-VITE) CAPS Take 1 capsule by mouth daily.   Yes Historical Provider, MD  nystatin (MYCOSTATIN/NYSTOP) powder Apply topically See admin instructions. Apply topically to groin three times daily for irritation   Yes Historical Provider, MD  OXYGEN Inhale 2 L into the lungs continuous.   Yes Historical Provider, MD  pantoprazole (PROTONIX) 40 MG tablet Take 1 tablet (40 mg total) by mouth 2 (two) times daily. 08/08/16  Yes Silver Huguenin Elgergawy, MD  potassium chloride SA (K-DUR,KLOR-CON) 20 MEQ tablet Take 20 mEq by mouth 2 (two) times daily.   Yes Historical Provider, MD  PRESCRIPTION MEDICATION Take 1 mL by mouth See admin instructions. Gel compounded at [facility] [place]: Ativan 1 mg/Benadryl 25 mg/ Haldol 1 mg - per ml - apply 1 ml topically twice daily for anxiety/paranoia   Yes Historical Provider, MD  QUEtiapine (SEROQUEL) 50 MG tablet Take 50 mg by mouth at bedtime.   Yes Historical  Provider, MD  warfarin (COUMADIN) 2.5 MG tablet Take 2.5 mg by mouth daily.    Historical Provider, MD   Allergies  Allergen Reactions  . Potassium-Containing Compounds Anaphylaxis    IV--loss of conciousness  . Metoprolol Succinate Other (See Comments)    Extremely tired   Review of Systems  Unable to perform ROS: Acuity of condition    Physical Exam  Constitutional: He appears ill. He is intubated.  Cardiovascular: Normal rate, regular rhythm and normal heart sounds.   Pulmonary/Chest: He is intubated.  Skin: Skin is warm and dry.    Vital Signs: BP 135/63   Pulse 64   Temp 98.3 F (36.8 C) (Oral)   Resp 20   Ht _0  (1.778 m)   Wt 77.9 kg (171 lb 11.8 oz)   SpO2 100%   BMI 24.64 kg/m  Pain Assessment: CPOT POSS *See Group Information*: 1-Acceptable,Awake and alert Pain Score: 0-No pain   SpO2: SpO2: 100 % O2 Device:SpO2: 100 % O2 Flow Rate: .O2 Flow Rate (L/min): 15 L/min  IO: Intake/output summary:  Intake/Output Summary (Last 24 hours) at 09/30/16 1041 Last data  filed at 09/30/16 0930  Gross per 24 hour  Intake           810.81 ml  Output             4425 ml  Net         -3614.19 ml    LBM: Last BM Date: 09/27/16 Baseline Weight: Weight: 73.9 kg (163 lb) Most recent weight: Weight: 77.9 kg (171 lb 11.8 oz)      Palliative Assessment/Data: 30% PTA   Discussed with Dr Trula Slade and Dr Halford Chessman  Time In: 1200 Time Out: 1400 Time Total: 120 min Greater than 50%  of this time was spent counseling and coordinating care related to the above assessment and plan.  Signed by: Wadie Lessen, NP   Please contact Palliative Medicine Team phone at [phone] for questions and concerns.  For individual provider: See Shea Evans

## 2016-09-30 NOTE — Progress Notes (Signed)
ANTICOAGULATION CONSULT NOTE - Follow Up Consult  Pharmacy Consult for heparin Indication: atrial fibrillation  Labs:  Recent Labs  09/28/16 0140 09/28/16 0858 09/29/16 0424 09/29/16 1200 09/29/16 1949 09/30/16 0155  HGB 9.6* 8.8* 9.1*  --   --   --   HCT 34.2* 31.3* 31.1*  --   --   --   PLT 188 164 170  --   --   --   LABPROT  --   --   --  17.7*  --   --   INR  --   --   --  1.44  --   --   HEPARINUNFRC  --   --   --   --   --  0.13*  CREATININE 1.11 0.98 0.91 0.88 0.91  --   TROPONINI 0.09*  --   --   --   --   --     Assessment: 82yo male subtherapeutic on heparin with initial dosing for Afib after Coumadin had been on hold; RN reports no signs of bleeding.  Goal of Therapy:  Heparin level 0.3-0.7 units/ml   Plan:  Will increase heparin gtt by 2 units/kg/hr to 1200 units/hr and check level in [place], PharmD, BCPS  09/30/2016,3:24 AM

## 2016-09-30 NOTE — Progress Notes (Signed)
  Echocardiogram 2D Echocardiogram has been performed.  Tresa Res 09/30/2016, 10:27 AM

## 2016-09-30 NOTE — Progress Notes (Signed)
ANTICOAGULATION CONSULT NOTE - Follow up [place] for heparin > d/c Indication: atrial fibrillation  Allergies  Allergen Reactions  . Potassium-Containing Compounds Anaphylaxis    IV--loss of conciousness  . Metoprolol Succinate Other (See Comments)    Extremely tired    Patient Measurements: Height: 5\' 10"  (177.8 cm) Weight: 171 lb 11.8 oz (77.9 kg) IBW/kg (Calculated) : 73 Heparin Dosing Weight: 78.7 kg  Vital Signs: Temp: 97.7 F (36.5 C) (04/10 1155) Temp Source: Oral (04/10 1155) BP: 136/63 (04/10 1400) Pulse Rate: 59 (04/10 1400)  Labs:  Recent Labs  09/28/16 0140 09/28/16 0858 09/29/16 0424 09/29/16 1200 09/29/16 1949 09/30/16 0155 09/30/16 0536 09/30/16 1100  HGB 9.6* 8.8* 9.1*  --   --   --  8.5*  --   HCT 34.2* 31.3* 31.1*  --   --   --  29.0*  --   PLT 188 164 170  --   --   --  164  --   LABPROT  --   --   --  17.7*  --   --   --   --   INR  --   --   --  1.44  --   --   --   --   HEPARINUNFRC  --   --   --   --   --  0.13*  --  0.22*  CREATININE 1.11 0.98 0.91 0.88 0.91  --  0.85  --   TROPONINI 0.09*  --   --   --   --   --   --   --     Estimated Creatinine Clearance: 72.8 mL/min (by C-G formula based on SCr of 0.85 mg/dL).   Medical History: Past Medical History:  Diagnosis Date  . Aortal stenosis 2006   biophrostetic  . Arthritis   . Atrial fibrillation ([place])    onset after CABG--- [facility]mad[facility]  . Carotid artery disease ([place]) 09/27/2010   Carotid US 12/17: bilat ICA 40-59 >> 1 year FU  . CHF (congestive heart failure) ([place])    abter CABG  . Complete heart block ([place])   . Coronary artery disease   . DM type 2 (diabetes mellitus, type 2) ([place])    used to see Dr Meredith Pel, now f/u by Dr Larose Kells  . Erectile dysfunction   . Gangrene ([place])   . History of nephrolithiasis   . HTN (hypertension)   . Hyperlipidemia   . Memory loss   . Obesity   . OSA (obstructive sleep apnea)    not using CPAP  .  Pacemaker 2/07   VVI  . PVD (peripheral vascular disease) ([name])    CT angio showed- vascular insuff, intestine and RAS bilaterally, andgiogram 02/2009 severe PVD medical management  . S/P aortic valve replacement   . Sick sinus syndrome ([name])    [name]  . Skin cancer    surgery (nose) 09-2010    Assessment: 82 yo male on warfarin prior to admission for AFib, held on admission for high bleed risk. Heparin level subtherapeutic however, heparin was stopped as patient is moving towards comfort care.    Goal of Therapy:  Heparin level 0.3-0.7 units/ml Monitor platelets by anticoagulation protocol: Yes    Plan:  -D/c heparin and pharmacy consult to dose -Please reconsult as needed   Carlean Jews, Pharm.D. PGY1 Pharmacy Resident 4/10/20183:23 PM Pager [phone]

## 2016-09-30 NOTE — Progress Notes (Signed)
    Subjective  -   Patient remains intubated and sedated   Physical Exam:  Intubated and sedated Left leg is tender to the touch and mottled.  It appears nonviable up to the level of the knee.  He has palpable left femoral pulse Heel ulcer on the right.       Assessment/Plan:   I participated in a palliative care discussion with the family.  The family understands that based on the appearance of the leg the only intervention would be an above-knee amputation.  After an extensive discussion with the family as well as the palliative care team, the family is leaning towards comfort measures.  Not all of the details have been ironed out.  However, the decision to not proceed with amputation has been made.  I will be available to assist in any matters in the immediate future if needed.  Annamarie Major 09/30/2016 3:23 PM --  Vitals:   09/30/16 1300 09/30/16 1400  BP: 137/63 136/63  Pulse: 70 (!) 59  Resp: 16 16  Temp:      Intake/Output Summary (Last 24 hours) at 09/30/16 1523 Last data filed at 09/30/16 1419  Gross per 24 hour  Intake           956.63 ml  Output             4475 ml  Net         -3518.37 ml     Laboratory CBC    Component Value Date/Time   WBC 8.4 09/30/2016 0536   HGB 8.5 (L) 09/30/2016 0536   HGB 14.1 11/22/2010 0953   HCT 29.0 (L) 09/30/2016 0536   HCT 42.6 11/22/2010 0953   PLT 164 09/30/2016 0536   PLT 223 11/22/2010 0953    BMET    Component Value Date/Time   NA 147 (H) 09/30/2016 0536   NA 139 08/29/2016   K 3.2 (L) 09/30/2016 0536   CL 102 09/30/2016 0536   CO2 36 (H) 09/30/2016 0536   GLUCOSE 139 (H) 09/30/2016 0536   GLUCOSE 229 08/07/2008 0000   BUN 12 09/30/2016 0536   BUN 36 (A) 08/29/2016   CREATININE 0.85 09/30/2016 0536   CREATININE 1.17 03/19/2016 1534   CALCIUM 8.7 (L) 09/30/2016 0536   GFRNONAA >60 09/30/2016 0536   GFRAA >60 09/30/2016 0536    COAG Lab Results  Component Value Date   INR 1.44 09/29/2016   INR 2.23 09/20/2016   INR 2.21 09/10/2016   PROTIME 16.2 12/05/2008   No results found for: PTT  Antibiotics Anti-infectives    Start     Dose/Rate Route Frequency Ordered Stop   09/18/2016 2300  Ampicillin-Sulbactam (UNASYN) 3 g in sodium chloride 0.9 % 100 mL IVPB     3 g 200 mL/hr over 30 Minutes Intravenous Every 6 hours 09/09/2016 2212 09/24/16 1809   09/05/2016 2000  ampicillin (OMNIPEN) 1 g in sodium chloride 0.9 % 50 mL IVPB  Status:  Discontinued     1 g 150 mL/hr over 20 Minutes Intravenous Every 6 hours 09/03/2016 1954 09/12/2016 2212       V. Leia Alf, M.D. [facility] Office: [phone] Pager:  [phone]

## 2016-09-30 NOTE — Progress Notes (Addendum)
Between 12 and 12:30 this RN in patients room for patient assessment. Unable to palpate bilateral pedal pulses. Also unable to palpate left popliteal. Left extremity noted to be cool, appears pale, dusky. 12:45 doppler obtained, right pedal pulse present, left pedal and popliteal pulse absent. Second RN brought to verify, no pulse noted below femoral in left lower extremity. Left lower extremity cold with mottling present. MD notified. Vascular consult called. MD to update family. Wife's cell number provided. Will continue to monitor.

## 2016-10-01 DIAGNOSIS — R451 Restlessness and agitation: Secondary | ICD-10-CM

## 2016-10-01 DIAGNOSIS — J810 Acute pulmonary edema: Secondary | ICD-10-CM

## 2016-10-01 LAB — BASIC METABOLIC PANEL
ANION GAP: 7 (ref 5–15)
Anion gap: 11 (ref 5–15)
BUN: 15 mg/dL (ref 6–20)
BUN: 16 mg/dL (ref 6–20)
CALCIUM: 8.6 mg/dL — AB (ref 8.9–10.3)
CALCIUM: 8.4 mg/dL — AB (ref 8.9–10.3)
CO2: 34 mmol/L — AB (ref 22–32)
CO2: 34 mmol/L — ABNORMAL HIGH (ref 22–32)
CREATININE: 1 mg/dL (ref 0.61–1.24)
CREATININE: 1.02 mg/dL (ref 0.61–1.24)
Chloride: 104 mmol/L (ref 101–111)
Chloride: 107 mmol/L (ref 101–111)
GFR calc non Af Amer: >60 mL/min (ref >60)
Glucose, Bld: 189 mg/dL — ABNORMAL HIGH (ref 65–99)
Glucose, Bld: 180 mg/dL — ABNORMAL HIGH (ref 65–99)
Potassium: 2.8 mmol/L — ABNORMAL LOW (ref 3.5–5.1)
Potassium: 3.8 mmol/L (ref 3.5–5.1)
SODIUM: 149 mmol/L — AB (ref 135–145)
SODIUM: 148 mmol/L — AB (ref 135–145)

## 2016-10-01 LAB — GLUCOSE, CAPILLARY
GLUCOSE-CAPILLARY: 152 mg/dL — AB (ref 65–99)
GLUCOSE-CAPILLARY: 156 mg/dL — AB (ref 65–99)
GLUCOSE-CAPILLARY: 169 mg/dL — AB (ref 65–99)
Glucose-Capillary: 167 mg/dL — ABNORMAL HIGH (ref 65–99)

## 2016-10-01 LAB — MAGNESIUM: MAGNESIUM: 1.4 mg/dL — AB (ref 1.7–2.4)

## 2016-10-01 MED ORDER — POTASSIUM CHLORIDE 20 MEQ/15ML (10%) PO SOLN
40.0000 meq | Freq: Once | ORAL | Status: AC
Start: 2016-10-01 — End: 2016-10-01
  Administered 2016-10-01: 40 meq
  Filled 2016-10-01: qty 30

## 2016-10-01 MED ORDER — POTASSIUM CHLORIDE 20 MEQ/15ML (10%) PO SOLN
40.0000 meq | Freq: Once | ORAL | Status: AC
  Administered 2016-10-01: 40 meq via ORAL
  Filled 2016-10-01: qty 30

## 2016-10-01 MED ORDER — FENTANYL 2500MCG IN NS 250ML (10MCG/ML) PREMIX INFUSION
25.0000 ug/h | INTRAVENOUS | Status: DC
Start: 2016-10-01 — End: 2016-10-02
  Administered 2016-10-01: 25 ug/h via INTRAVENOUS
  Administered 2016-10-02: 150 ug/h via INTRAVENOUS
  Filled 2016-10-01 (×2): qty 250

## 2016-10-01 NOTE — Progress Notes (Signed)
PULMONARY  / CRITICAL CARE MEDICINE  Name: [name]. MRN: [id number] DOB: 19-Jun-1935    LOS: 49  REFERRING MD :  Dr. Harvie Bridge  CHIEF COMPLAINT:  Respiratory failure  BRIEF PATIENT DESCRIPTION: [name]. is a 82 y.o. male COPD, acute on chronic HFpEF, CAD status post CABG, aortic valve replacement and pacemaker placement, chronic atrial fibrillation on Coumadin who initially presented on 09/14/2016 with altered mentation in the setting of accidental medication overdose (ativan, haldol and benadryl). His mental status gradually improved through the past week. CXR showed bilateral pleural effusions and signs of pulmonary edema. He underwent thoracentesis 4/5 morning with removal of 1.4L of transudative fluid. For the past couple of days he has been getting furosemide therapy. This morning, he was hypoxic on monitor with altered mental status and Code Blue was called for intubation. After arrival in MICU, patient was responsive and moving all extremities while intubated.  LINES / TUBES: NG, foley ETT 4/8 PIC 4/3 >> PICC 4/8>>  CULTURES: Rt effusion culture- 4/5 >> NGTD  ANTIBIOTICS: unasyn 3/30-4/4 finished course. No fevers.   Imaging:  CXR 4/5>> no change in diffuse pulmonary edema and b/l pleural effusions  CXR 4/11 - b/l pleural effusion.  ECHO 4/10: normal EF, reduced RV function, PA pressure 45   INTERVAL HISTORY: Left LE mottled, vasc sx recommended AKA but family wants comfort. Agitation overnight, trying to pull out ETT, controlled on precedex gtt and has restraints.   VITAL SIGNS: Temp:  [97.7 F (36.5 C)-98.7 F (37.1 C)] 98.5 F (36.9 C) (04/11 0746) Pulse Rate:  [31-70] 69 (04/11 0800) Resp:  [13-26] 19 (04/11 0800) BP: (98-159)/(57-93) 143/93 (04/11 0800) SpO2:  [92 %-100 %] 97 % (04/11 0800) FiO2 (%):  [30 %] 30 % (04/11 0800) Weight:  [162 lb 4.1 oz (73.6 kg)] 162 lb 4.1 oz (73.6 kg) (04/11 0436) HEMODYNAMICS:   VENTILATOR  SETTINGS: Vent Mode: PRVC FiO2 (%):  [30 %] 30 % Set Rate:  [16 bmp] 16 bmp Vt Set:  [580 mL] 580 mL PEEP:  [5 cmH20] 5 cmH20 Plateau Pressure:  [18 cmH20-21 cmH20] 21 cmH20 INTAKE / OUTPUT: Intake/Output      04/10 0701 - 04/11 0700 04/11 0701 - 04/12 0700   I.V. (mL/kg) 829.1 (11.3)    Other     NG/GT     Total Intake(mL/kg) 829.1 (11.3)    Urine (mL/kg/hr) 2250 (1.3)    Emesis/NG output     Total Output 2250     Net -1420.9            PHYSICAL EXAMINATION: General:  Elderly, NAD, sedated Neuro:  RASS -1.  HEENT:  ETT, moist mucous membranes Cardiovascular:  Bradycardic, LLL sternal murmur  Lungs:  CTAB, no wheezing or crackles Abdomen:   Non TTP, soft Ext: Left lower leg cold, unable to palpate pulse, wound dressings dry. Rt leg warm, unable to palpate pulse but warm and perfused    LABS: Cbc  Recent Labs Lab 09/28/16 0858 09/29/16 0424 09/30/16 0536  WBC 10.3 9.5 8.4  HGB 8.8* 9.1* 8.5*  HCT 31.3* 31.1* 29.0*  PLT 164 170 164    Chemistry   Recent Labs Lab 09/25/16 0236  09/28/16 0858  09/29/16 1200 09/29/16 1949 09/30/16 0536  NA 142  < > 144  < > 146* 147* 147*  K 3.3*  < > 3.5  < > 2.8* 3.5 3.2*  CL 100*  < > 99*  < > 101  102 102  CO2 33*  < > 37*  < > 38* 37* 36*  BUN 44*  < > 26*  < > 17 15 12   CREATININE 1.46*  < > 0.98  < > 0.88 0.91 0.85  CALCIUM 8.9  < > 9.1  < > 9.0 9.0 8.7*  MG 2.0  --  1.7  --   --   --  1.5*  PHOS  --   --  1.9*  --   --   --  1.7*  GLUCOSE 154*  < > 195*  < > 142* 125* 139*  < > = values in this interval not displayed.  Liver fxn  Recent Labs Lab 09/25/16 0236 09/28/16 0140  AST  --  64*  ALT  --  59  ALKPHOS  --  284*  BILITOT  --  0.5  PROT 6.0* 6.5  ALBUMIN  --  1.8*   coags  Recent Labs Lab 09/29/16 1200  INR 1.44   Sepsis markers No results for input(s): LATICACIDVEN, PROCALCITON in the last 168 hours. Cardiac markers  Recent Labs Lab 09/28/16 0140  TROPONINI 0.09*   BNP No  results for input(s): PROBNP in the last 168 hours. ABG  Recent Labs Lab 09/28/16 0140  PHART 7.327*  PCO2ART 66.6*  PO2ART 269*  HCO3 33.9*    CBG trend  Recent Labs Lab 09/30/16 1540 09/30/16 2033 10/01/16 0016 10/01/16 0404 10/01/16 0744  GLUCAP 117* 155* 156* 167* 169*    IMAGING:  ECG:  DIAGNOSES: Principal Problem:   Overdose, accidental or unintentional, initial encounter Active Problems:   DM (diabetes mellitus) type II uncontrolled, periph vascular disorder ([name])   Obstructive sleep apnea   Essential hypertension   CAD (coronary artery disease)   Chronic atrial fibrillation ([name])   Pleural effusion, left   Anemia   Chronic respiratory failure with hypoxia ([name])   AKI (acute kidney injury) ([place])   Accidental overdose   Acute on chronic respiratory failure with hypoxia ([name])   Pressure injury of skin   Protein-calorie malnutrition, severe   DNR (do not resuscitate)   Palliative care by specialist   ASSESSMENT / PLAN:  PULMONARY A: Acute hypoxic respiratory failure with pulmonary edema and pleural effusions On home 2L O2 Recurrent rt pleural effusion s/p thoracentesis --4/5 yielded 1.4L clear, yellow fluid; also thoracentesis on 3/31 COPD Severe pulmonary htn, CPAP qhs at home P:   Will continue mechanical ventilation, family is deciding regarding terminal extubation.  VAP bundle and lung protective ventilation  CARDIOVASCULAR A:  HFpEF, s/p aortic valve replacement, s/p CABG PPM Chronic atrial fibrillation PVD ECHO 4/10: EF 50-55%, PA pressure 45, RV systolic func reduced.  P:  Cont IV lasix, be careful as he is likely preload depended with pHTN. Hold lasix if BP drops.  LLE ischemia, needs AKA but decided on comfort.  Hold heparin for now as family deciding GOC.   RENAL A:   CKD Hypernatremia worsening every day hypok+ P:   Tolerating diuresis with improving creatinine Recheck BMET today to trend Na+ and K+.     GASTROINTESTINAL A:   Mild abdominal pain P:   Will monitor OG tube, consider TF depending on family decision about GOC (hold if doing terminal ext).   HEMATOLOGIC A:   chronic anemia b/l ~9, stable.  Needs anticoag for Afib, but not on due to bleeding risk.  P:  Cont to monitor.    INFECTIOUS A:   Multiple skin wounds/ulcers Low suspicion  for pulmonary infection  P:   Received 6 days of unasyn for possible asp pna Wound care following Needs AKA for LLE ischemia but decided on comfort.  ENDOCRINE A:   Diabetes mellitus P:   SSI   NEUROLOGIC A:   Acute encephalopathy in setting of hypoxemia, nonfocal exam Chronic pain  P:   Morphine prn, dexmedetomidine while intubated to use for RASS goal 0  Dellia Nims, M.D  PGY-3.    10/01/2016, 8:23 AM    ATTENDING NOTE / ATTESTATION NOTE :   I have discussed the case with the resident/APP  Dr. Genene Churn  I agree with the resident/APP's  history, physical examination, assessment, and plans.    I have edited the above note and modified it according to our agreed history, physical examination, assessment and plan.    Briefly, [name]. is a 82 y.o. male COPD, acute on chronic HFpEF, CAD status post CABG, aortic valve replacement and pacemaker placement, chronic atrial fibrillation on Coumadin who initially presented on 08/27/2016 with altered mentation in the setting of accidental medication overdose (ativan, haldol and benadryl). His mental status gradually improved through the past week. CXR showed bilateral pleural effusions and signs of pulmonary edema. He underwent thoracentesis 4/5 morning with removal of 1.4L of transudative fluid. For the past couple of days he has been getting furosemide therapy. He had worsening respiratory distress over the weekend requiring intubation. He was intubated and diuresis over the weekend. He was noted to have cyanotic left lower extremity for which vascular surgery has been  consulted. Surgery was suggested but patient is very high risk for procedure. Family opted to be conservative.  Palliative care team has been involved. There has been discussions regarding one-way extubation. Per discussions yesterday, plan was to see how patient was doing today and family will decide regarding transitioning him to comfort care today.   Vitals:  Vitals:   10/01/16 0700 10/01/16 0746 10/01/16 0800 10/01/16 0900  BP: (!) 143/92  (!) 143/93 (!) 119/55  Pulse: (!) 35  69 64  Resp: (!) 26  19 16   Temp:  98.5 F (36.9 C)    TempSrc:  Oral    SpO2: 100%  97% 100%  Weight:      Height:        Constitutional/General: Chronically ill,, intubated, sedated, not in any distress  Body mass index is 23.28 kg/m. Wt Readings from Last 3 Encounters:  10/01/16 73.6 kg (162 lb 4.1 oz)  09/17/16 77.1 kg (170 lb)  09/15/16 76.7 kg (169 lb)    HEENT: PERLA, anicteric sclerae. (-) Oral thrush. Intubated, ETT in place  Neck: No masses. Midline trachea. No JVD, (-) LAD. (-) bruits appreciated.  Respiratory/Chest: Grossly normal chest. (-) deformity. (-) Accessory muscle use.  Symmetric expansion. Diminished BS on both lower lung zones. (-) wheezing, rhonchi Crackles at bases.  (-) egophony  Cardiovascular: Regular rate and  rhythm, heart sounds normal, no murmur or gallops,  Gr 1  peripheral edema  Gastrointestinal:  Normal bowel sounds. Soft, non-tender. No hepatosplenomegaly.  (-) masses.   Musculoskeletal:  Normal muscle tone.   Extremities: Cyanotic and cool left lower extremity.  (+) edema  Skin: (-) rash,lesions seen.   Neurological/Psychiatric : sedated, intubated. CN grossly intact. (-) lateralizing signs.    CBC Recent Labs     09/29/16  0424  09/30/16  0536  WBC  9.5  8.4  HGB  9.1*  8.5*  HCT  31.1*  29.0*  PLT  170  164    Coag's Recent Labs     09/29/16  1200  INR  1.44    BMET Recent Labs     09/29/16  1949  09/30/16  0536  10/01/16   0900  NA  147*  147*  149*  K  3.5  3.2*  2.8*  CL  102  102  104  CO2  37*  36*  34*  BUN  15  12  15   CREATININE  0.91  0.85  1.00  GLUCOSE  125*  139*  189*    Electrolytes Recent Labs     09/29/16  1949  09/30/16  0536  10/01/16  0900  CALCIUM  9.0  8.7*  8.6*  MG   --   1.5*   --   PHOS   --   1.7*   --     Sepsis Markers No results for input(s): PROCALCITON, O2SATVEN in the last 72 hours.  Invalid input(s): LACTICACIDVEN  ABG No results for input(s): PHART, PCO2ART, PO2ART in the last 72 hours.  Liver Enzymes No results for input(s): AST, ALT, ALKPHOS, BILITOT, ALBUMIN in the last 72 hours.  Cardiac Enzymes No results for input(s): TROPONINI, PROBNP in the last 72 hours.  Glucose Recent Labs     09/30/16  1153  09/30/16  1540  09/30/16  2033  10/01/16  0016  10/01/16  0404  10/01/16  0744  GLUCAP  141*  117*  155*  156*  167*  169*    Imaging Dg Chest Port 1 View  Result Date: 09/30/2016 CLINICAL DATA:  Evaluate endotracheal tube position EXAM: PORTABLE CHEST 1 VIEW COMPARISON:  Portable chest x-ray of 09/29/2016 FINDINGS: The tip of the endotracheal tube is somewhat difficult to visualize with overlying mediastinal suture wires, but the tip appears to be approximately 5.3 cm above the carina. The right central venous line tip overlies the lower SVC and permanent pacemaker remains. Median sternotomy sutures are noted. Cardiomegaly stable and there are bilateral pleural effusions with pulmonary vascular congestion consistent with CHF. IMPRESSION: 1. Little change in CHF with effusions. 2. Endotracheal tube tip difficult to visualize, but approximately 5.3 cm above the carina. 3. Right PICC line tip overlies the lower SVC. Electronically Signed   By: Ivar Drape M.D.   On: 09/30/2016 08:23    Assessment/Plan : Acute hypoxemic respiratory failure secondary to acute pulmonary edema, concern for healthcare associated pneumonia. Patient with significant cardiac  comorbidities with heart failure, atrial fibrillation, valvular heart disease. Patient also with diabetes, anemia. Patient also has peripheral vascular disease and his left lower extremity is cool and cyanotic. - We will touch base with family today and discuss regarding overall goals of care. Was told that family is considering transitioning patient to comfort care this afternoon. Palliative care team is involved. - Continue ventilatory support for now. - Continue diuresis. - If patient is not to be comfort care, and remain DNR/DNI, we need to continue monitoring his electrolytes with diuresis, replace as necessary, and consider resuming heparin drip for his peripheral vascular disease and atrial fibrillation. - Cont tube feeds for now.    I spent 30   minutes of Critical Care time with this patient today. This is my time spent independent of the APP or resident.   Family :  No family at bedside.    Monica Becton, MD 10/01/2016, 9:53 AM [facility] Pager [phone] After  3 pm or if no answer, call [phone]

## 2016-10-01 NOTE — Progress Notes (Signed)
HME and ballard changed 

## 2016-10-01 NOTE — Progress Notes (Signed)
                                                                                                                                                                                                         Daily Progress Note   Patient Name: Seth F Deamer Jr.       Date: 10/01/2016 DOB: 10/20/1934  Age: 81 y.o. MRN#: [id number] Attending Physician: Jose Angelo A de Dios, MD Primary Care Physician: Jose Paz, MD Admit Date: 09/15/2016  Reason for Consultation/Follow-up: Establishing goals of care, Non pain symptom management, Pain control and Psychosocial/spiritual support  Subjective:  - continued conversation with wife/by telephone regarding GOCs and natural trajectory at EOL  -discussed symptom management  specific to his agitation and family's concern for "gasping at EOL"   Wife reiterates goal is for comfort and not to see him struggle and "get the mitts off"  -family continues to struggle with decision to liberate [name] from the ventilator, however they desire sedation to ensure his comfort   - I meet with his two sisters later in the day and they believe that he would want a natural death, and hope his wife will decide to wean him from the ventilator  Length of Stay: 11  Current Medications: Scheduled Meds:  . chlorhexidine gluconate (MEDLINE KIT)  15 mL Mouth Rinse BID  . Chlorhexidine Gluconate Cloth  6 each Topical Daily  . collagenase   Topical Daily  . furosemide  40 mg Intravenous BID  . LORazepam  1 mg Intravenous Once  . mouth rinse  15 mL Mouth Rinse QID  . nystatin   Topical BID  . sodium chloride flush  10-40 mL Intracatheter Q12H    Continuous Infusions: . sodium chloride 10 mL/hr (09/30/16 1410)  . dexmedetomidine (PRECEDEX) IV infusion 0.3 mcg/kg/hr (10/01/16 0900)  . fentaNYL infusion INTRAVENOUS      PRN Meds: sodium chloride, bisacodyl, ipratropium-albuterol, lidocaine, LORazepam, sodium chloride flush  Physical Exam  Constitutional: He appears  cachectic. He appears ill. He is intubated.  Cardiovascular: Normal rate, regular rhythm and normal heart sounds.   Pulmonary/Chest: He is intubated.  Skin: Skin is warm and dry.            Vital Signs: BP (!) 119/55   Pulse 64   Temp 98.5 F (36.9 C) (Oral)   Resp 16   Ht 5' 10" (1.778 m)   Wt 73.6 kg (162 lb 4.1 oz)   SpO2 100%   BMI 23.28 kg/m  SpO2: SpO2: 100 % O2 Device: O2 Device: Ventilator   O2 Flow Rate: O2 Flow Rate (L/min): 15 L/min  Intake/output summary:  Intake/Output Summary (Last 24 hours) at 10/01/16 0932 Last data filed at 10/01/16 0900  Gross per 24 hour  Intake           782.39 ml  Output             2075 ml  Net         -1292.61 ml   LBM: Last BM Date: 09/27/16 Baseline Weight: Weight: 73.9 kg (163 lb) Most recent weight: Weight: 73.6 kg (162 lb 4.1 oz)       Palliative Assessment/Data: 20 % at best    Flowsheet Rows     Most Recent Value  Intake Tab  Referral Department  Critical care  Palliative Care Primary Diagnosis  Sepsis/Infectious Disease  Date Notified  09/30/16  Palliative Care Type  New Palliative care  Reason for referral  Clarify Goals of Care  Date of Admission  09/18/2016  Date first seen by Palliative Care  09/30/16  # of days Palliative referral response time  0 Day(s)  # of days IP prior to Palliative referral  11  Clinical Assessment  Palliative Performance Scale Score  20%  Psychosocial & Spiritual Assessment  Palliative Care Outcomes      Patient Active Problem List   Diagnosis Date Noted  . DNR (do not resuscitate)   . Palliative care by specialist   . Protein-calorie malnutrition, severe 09/24/2016  . Pressure injury of skin 09/20/2016  . AKI (acute kidney injury) ([name]) 08/23/2016  . Overdose, accidental or unintentional, initial encounter 08/25/2016  . Accidental overdose 09/03/2016  . Acute on chronic respiratory failure with hypoxia ([name])   . Acute gastric ulcer with hemorrhage   . PVC (premature ventricular  contraction)   . NSVT (nonsustained ventricular tachycardia) ([name])   . Right humeral fracture 07/25/2016  . Supratherapeutic INR 07/25/2016  . Chronic respiratory failure with hypoxia ([name]) 07/25/2016  . Fall 07/25/2016  . Alzheimer's dementia 07/25/2016  . Fracture of humerus neck, right, closed, initial encounter   . Anemia 07/24/2016  . Wound abscess   . Congestive heart failure ([name])   . S/P thoracentesis   . Malnutrition of moderate degree 07/04/2016  . Pleural effusion, left 07/04/2016  . Diabetic foot ulcer with osteomyelitis ([name]) 07/03/2016  . Hypoxia 07/03/2016  . Acute diastolic CHF (congestive heart failure) ([name]) 07/03/2016  . Abscess of abdominal wall 06/02/2014  . Abdominal wall abscess 06/02/2014  . Hyponatremia 06/02/2014  . Cellulitis 12/23/2013  . Aftercare following surgery of the circulatory system, NEC 10/05/2013  . Encounter for therapeutic drug monitoring 07/18/2013  . Tingling 11/01/2012  . Hypoxemia 08/04/2012  . Fatigue 01/20/2012  . Memory loss 01/20/2012  . Chronic diastolic heart failure ([name]) 09/01/2011  . Chronic atrial fibrillation ([name]) 08/21/2011  . Paresthesia 12/16/2010  . Medicare annual wellness visit, initial 10/10/2010  . Carotid artery disease ([name]) 09/27/2010  . Long term (current) use of anticoagulants 09/26/2010  . THYROID NODULE 01/26/2010  . Peripheral vascular disease ([name]) 01/02/2009  . HIP PAIN, BILATERAL 01/02/2009  . HLD (hyperlipidemia) 08/16/2008  . MYOCARDIAL INFARCTION, HX OF 08/16/2008  . S/P AVR (aortic valve replacement) 08/16/2008  . PERIPHERAL VASCULAR DISEASE WITH CLAUDICATION 08/16/2008  . ANEMIA, HX OF 08/16/2008  . AORTIC VALVE REPLACEMENT, HX OF 08/16/2008  . PACEMAKER, PERMANENT 08/16/2008  . ERECTILE DYSFUNCTION 11/26/2006  . DM (diabetes mellitus) type II uncontrolled, periph vascular disorder ([name]) 07/28/2006  . OBESITY, MORBID 07/28/2006  .   Obstructive sleep apnea 07/28/2006  . Essential hypertension  07/28/2006  . CAD (coronary artery disease) 07/28/2006  . RENAL ARTERY STENOSIS 07/28/2006  . INSUFFICIENCY, VASCULAR NOS, INTESTINE 07/28/2006  . NEPHROLITHIASIS, HX OF 07/28/2006    Palliative Care Assessment & Plan    Assessment:  -continued physical, functional and cognitive decline over the past many months.  Patient is at end of life.  Family struggle with full shift to comfort path and  liberating him from the ventilator.  Their main concern is that he "will struggle"   Educated and assured family that symptoms can be managed  Recommendations/Plan:  Symptom management -               Fentanyl gtt for pain/dyspnea              Continue Presedex for agitation  Mrs [name] tells me she will meet me tomorrow at 1:00 to discuss weaning patient from the ventilator   Goals of Care and Additional Recommendations:  Limitations on Scope of Treatment: Full Comfort Care   No escalation of care; family verbalizes "wish" that he would die on the ventilator so they don't have to make the decision to wean.  Code Status:    Code Status Orders        Start     Ordered   09/30/16 1350  Do not attempt resuscitation (DNR)  Continuous    Question Answer Comment  In the event of cardiac or respiratory ARREST Do not call a "code blue"   In the event of cardiac or respiratory ARREST Do not perform Intubation, CPR, defibrillation or ACLS   In the event of cardiac or respiratory ARREST Use medication by any route, position, wound care, and other measures to relive pain and suffering. May use oxygen, suction and manual treatment of airway obstruction as needed for comfort.      09/30/16 1352    Code Status History    Date Active Date Inactive Code Status Order ID Comments User Context   09/28/2016  2:33 AM 09/30/2016  1:52 PM Full Code [id number]  Arun Kannappan, MD Inpatient   08/28/2016  7:54 PM 09/28/2016  2:33 AM Full Code [id number]  Timothy S Opyd, MD ED   07/25/2016 12:36 AM 08/09/2016  2:12 PM  Full Code [id number]  Rondell A Smith, MD ED   07/03/2016  6:35 PM 07/22/2016  1:34 AM Full Code [id number]  Shanker M Ghimire, MD ED   06/06/2016  6:44 PM 06/08/2016  3:11 AM Full Code [id number]  James Allred, MD Inpatient   06/06/2016  6:43 PM 06/06/2016  6:44 PM Full Code [id number]  James Allred, MD Inpatient   06/02/2014  6:36 PM 06/07/2014  4:52 PM Full Code [id number]  Abraham Feliz Ortiz, MD Inpatient       Prognosis:   Hours - Days  Discharge Planning:  Anticipated Hospital Death  Care plan was discussed with bedside nurse  Thank you for allowing the Palliative Medicine Team to assist in the care of this patient.   Time In: 1100 Time Out: 1145 Total Time 45 min Prolonged Time Billed  no      Greater than 50%  of this time was spent counseling and coordinating care related to the above assessment and plan.  , , NP  Please contact Palliative Medicine Team phone at [phone] for questions and concerns.      

## 2016-10-02 DIAGNOSIS — R0902 Hypoxemia: Secondary | ICD-10-CM

## 2016-10-02 DIAGNOSIS — J9601 Acute respiratory failure with hypoxia: Secondary | ICD-10-CM

## 2016-10-02 DIAGNOSIS — I482 Chronic atrial fibrillation: Secondary | ICD-10-CM

## 2016-10-02 LAB — PHOSPHORUS: Phosphorus: 2.7 mg/dL (ref 2.5–4.6)

## 2016-10-02 LAB — BASIC METABOLIC PANEL
Anion gap: 8 (ref 5–15)
BUN: 17 mg/dL (ref 6–20)
CHLORIDE: 109 mmol/L (ref 101–111)
CO2: 32 mmol/L (ref 22–32)
Calcium: 8 mg/dL — ABNORMAL LOW (ref 8.9–10.3)
Creatinine, Ser: 1.14 mg/dL (ref 0.61–1.24)
GFR calc Af Amer: >60 mL/min (ref >60)
GFR calc non Af Amer: 59 mL/min — ABNORMAL LOW (ref >60)
GLUCOSE: 174 mg/dL — AB (ref 65–99)
POTASSIUM: 3.1 mmol/L — AB (ref 3.5–5.1)
SODIUM: 149 mmol/L — AB (ref 135–145)

## 2016-10-02 LAB — CBC
HCT: 31.7 % — ABNORMAL LOW (ref 39.0–52.0)
HEMOGLOBIN: 9.3 g/dL — AB (ref 13.0–17.0)
MCH: 25.7 pg — AB (ref 26.0–34.0)
MCHC: 29.3 g/dL — ABNORMAL LOW (ref 30.0–36.0)
MCV: 87.6 fL (ref 78.0–100.0)
Platelets: 169 10*3/uL (ref 150–400)
RBC: 3.62 MIL/uL — AB (ref 4.22–5.81)
RDW: 18.9 % — ABNORMAL HIGH (ref 11.5–15.5)
WBC: 10.2 10*3/uL (ref 4.0–10.5)

## 2016-10-02 LAB — MAGNESIUM: MAGNESIUM: 1.5 mg/dL — AB (ref 1.7–2.4)

## 2016-10-02 MED ORDER — POTASSIUM CHLORIDE 20 MEQ/15ML (10%) PO SOLN
40.0000 meq | Freq: Once | ORAL | Status: AC
  Administered 2016-10-02: 40 meq
  Filled 2016-10-02: qty 30

## 2016-10-02 MED ORDER — MAGNESIUM SULFATE 2 GM/50ML IV SOLN
2.0000 g | Freq: Once | INTRAVENOUS | Status: AC
Start: 2016-10-02 — End: 2016-10-02
  Administered 2016-10-02: 2 g via INTRAVENOUS
  Filled 2016-10-02: qty 50

## 2016-10-02 MED ORDER — FENTANYL 2500MCG IN NS 250ML (10MCG/ML) PREMIX INFUSION
25.0000 ug/h | INTRAVENOUS | Status: DC
  Administered 2016-10-02: 75 ug/h via INTRAVENOUS
  Administered 2016-10-03: 200 ug/h via INTRAVENOUS
  Administered 2016-10-03: 100 ug/h via INTRAVENOUS
  Filled 2016-10-02 (×2): qty 250

## 2016-10-02 NOTE — Progress Notes (Addendum)
Daily Progress Note   Patient Name: [name].       Date: 10/02/2016 DOB: [birth year]  Age: 82 y.o. MRN#: [id number] Attending Physician: Rush Landmark, MD Primary Care Physician: [name], MD Admit Date: 09/09/2016  Reason for Consultation/Follow-up: Establishing goals of care, Non pain symptom management, Pain control and Psychosocial/spiritual support  Subjective:  - meet with family to include wife, daughters, sisters and grandsons.  All gather with intention of liberation from ventilator today  -discussed symptom management  specific to his agitation and family's concern for "gasping at EOL"   Wife reiterates goal is for comfort and not to "see him struggle"  -offered assurance that we will maximize comfort with utilization of prn medications   - discussed natural trajectory and expectations at EOL, - emotional support offered  Length of Stay: 12  Current Medications: Scheduled Meds:  . chlorhexidine gluconate (MEDLINE KIT)  15 mL Mouth Rinse BID  . Chlorhexidine Gluconate Cloth  6 each Topical Daily  . collagenase   Topical Daily  . furosemide  40 mg Intravenous BID  . LORazepam  1 mg Intravenous Once  . mouth rinse  15 mL Mouth Rinse QID  . nystatin   Topical BID  . sodium chloride flush  10-40 mL Intracatheter Q12H    Continuous Infusions: . sodium chloride 10 mL/hr at 10/01/16 2000  . dexmedetomidine (PRECEDEX) IV infusion 0.6 mcg/kg/hr (10/02/16 1008)  . fentaNYL infusion INTRAVENOUS 75 mcg/hr (10/02/16 0834)    PRN Meds: sodium chloride, bisacodyl, ipratropium-albuterol, lidocaine, LORazepam, sodium chloride flush  Physical Exam  Constitutional: He appears cachectic. He appears ill. He is intubated.  Cardiovascular: Normal rate, regular rhythm  and normal heart sounds.   - BLE mottling, L>R  Pulmonary/Chest: He is intubated.  Skin: Skin is warm and dry.            Vital Signs: BP 110/61 (BP Location: Right Leg)   Pulse (!) 58   Temp 98 F (36.7 C) (Oral)   Resp 11   Ht 5' 10"  (1.778 m)   Wt 73.6 kg (162 lb 4.1 oz)   SpO2 100%   BMI 23.28 kg/m  SpO2: SpO2: 100 % O2 Device: O2 Device: Ventilator O2 Flow Rate: O2 Flow Rate (L/min): 15 L/min  Intake/output summary:   Intake/Output  Summary (Last 24 hours) at 10/02/16 1445 Last data filed at 10/02/16 1200  Gross per 24 hour  Intake          1084.68 ml  Output              355 ml  Net           729.68 ml   LBM: Last BM Date: 09/27/16 Baseline Weight: Weight: 73.9 kg (163 lb) Most recent weight: Weight: 73.6 kg (162 lb 4.1 oz)       Palliative Assessment/Data: 20 % at best    Flowsheet Rows     Most Recent Value  Intake Tab  Referral Department  Critical care  Palliative Care Primary Diagnosis  Sepsis/Infectious Disease  Date Notified  09/30/16  Palliative Care Type  New Palliative care  Reason for referral  Clarify Goals of Care  Date of Admission  08/25/2016  Date first seen by Palliative Care  09/30/16  # of days Palliative referral response time  0 Day(s)  # of days IP prior to Palliative referral  11  Clinical Assessment  Palliative Performance Scale Score  20%  Psychosocial & Spiritual Assessment  Palliative Care Outcomes      Patient Active Problem List   Diagnosis Date Noted  . Acute pulmonary edema ([name])   . Agitation   . DNR (do not resuscitate)   . Palliative care by specialist   . Protein-calorie malnutrition, severe 09/24/2016  . Pressure injury of skin 09/20/2016  . AKI (acute kidney injury) ([place]) 09/04/2016  . Overdose, accidental or unintentional, initial encounter 09/06/2016  . Accidental drug overdose 08/21/2016  . Acute respiratory failure with hypoxia ([place])   . Acute gastric ulcer with hemorrhage   . PVC (premature ventricular  contraction)   . NSVT (nonsustained ventricular tachycardia) ([place])   . Right humeral fracture 07/25/2016  . Supratherapeutic INR 07/25/2016  . Chronic respiratory failure with hypoxia ([place]) 07/25/2016  . Fall 07/25/2016  . Alzheimer's dementia 07/25/2016  . Fracture of humerus neck, right, closed, initial encounter   . Anemia 07/24/2016  . Wound abscess   . Congestive heart failure ([place])   . S/P thoracentesis   . Malnutrition of moderate degree 07/04/2016  . Pleural effusion, left 07/04/2016  . Diabetic foot ulcer with osteomyelitis ([place]) 07/03/2016  . Hypoxia 07/03/2016  . Acute diastolic CHF (congestive heart failure) ([place]) 07/03/2016  . Abscess of abdominal wall 06/02/2014  . Abdominal wall abscess 06/02/2014  . Hyponatremia 06/02/2014  . Cellulitis 12/23/2013  . Aftercare following surgery of the circulatory system, [place] 10/05/2013  . Encounter for therapeutic drug monitoring 07/18/2013  . Tingling 11/01/2012  . Hypoxemia 08/04/2012  . Fatigue 01/20/2012  . Memory loss 01/20/2012  . Chronic diastolic heart failure ([place]) 09/01/2011  . Chronic atrial fibrillation ([place]) 08/21/2011  . Paresthesia 12/16/2010  . Medicare annual wellness visit, initial 10/10/2010  . Carotid artery disease ([place]) 09/27/2010  . Long term (current) use of anticoagulants 09/26/2010  . THYROID NODULE 01/26/2010  . Peripheral vascular disease ([place]) 01/02/2009  . HIP PAIN, BILATERAL 01/02/2009  . HLD (hyperlipidemia) 08/16/2008  . MYOCARDIAL INFARCTION, HX OF 08/16/2008  . S/P AVR (aortic valve replacement) 08/16/2008  . PERIPHERAL VASCULAR DISEASE WITH CLAUDICATION 08/16/2008  . ANEMIA, HX OF 08/16/2008  . AORTIC VALVE REPLACEMENT, HX OF 08/16/2008  . PACEMAKER, PERMANENT 08/16/2008  . ERECTILE DYSFUNCTION 11/26/2006  . DM (diabetes mellitus) type II uncontrolled, periph vascular disorder ([place]) 07/28/2006  . OBESITY, MORBID 07/28/2006  .  Obstructive sleep apnea 07/28/2006  . Essential hypertension  07/28/2006  . CAD (coronary artery disease) 07/28/2006  . RENAL ARTERY STENOSIS 07/28/2006  . INSUFFICIENCY, VASCULAR NOS, INTESTINE 07/28/2006  . NEPHROLITHIASIS, HX OF 07/28/2006    Palliative Care Assessment & Plan    Assessment:  - decision for liberation from vent - symptom management in place and discussed with nursing  - respiratory therapy extubated patient to room air, patient looks comfortable, family gathered at bedside and at peace with situation.  All verbalizing love and support for each other   Recommendations/Plan:  Symptom management - titration orders in place              Fentanyl gtt for pain/dyspnea              Continue Presedex for agitation    Goals of Care and Additional Recommendations:  Limitations on Scope of Treatment: Full Comfort Care   Extubate from ventilator   Code Status:    Code Status Orders        Start     Ordered   09/30/16 1350  Do not attempt resuscitation (DNR)  Continuous    Question Answer Comment  In the event of cardiac or respiratory ARREST Do not call a "code blue"   In the event of cardiac or respiratory ARREST Do not perform Intubation, CPR, defibrillation or ACLS   In the event of cardiac or respiratory ARREST Use medication by any route, position, wound care, and other measures to relive pain and suffering. May use oxygen, suction and manual treatment of airway obstruction as needed for comfort.      09/30/16 1352    Code Status History    Date Active Date Inactive Code Status Order ID Comments User Context   09/28/2016  2:33 AM 09/30/2016  1:52 PM Full Code [id number]  Beverely Low, MD Inpatient   09/13/2016  7:54 PM 09/28/2016  2:33 AM Full Code [id number]  Vianne Bulls, MD ED   07/25/2016 12:36 AM 08/09/2016  2:12 PM Full Code [id number]  Norval Morton, MD ED   07/03/2016  6:35 PM 07/22/2016  1:34 AM Full Code [id number]  Jonetta Osgood, MD ED   06/06/2016  6:44 PM 06/08/2016  3:11 AM Full Code [id number]  Thompson Grayer, MD Inpatient   06/06/2016  6:43 PM 06/06/2016  6:44 PM Full Code [id number]  [place] Grayer, MD Inpatient   06/02/2014  6:36 PM 06/07/2014  4:52 PM Full Code [id number]  Charlynne Cousins, MD Inpatient       Prognosis: -  Hours to days  Discharge Planning:  Anticipated Hospital Death  Care plan was discussed with bedside nurse  Thank you for allowing the Palliative Medicine Team to assist in the care of this patient.   Time In: 1600 Time Out: 1740 Total Time 110 min Prolonged Time Billed  no      Greater than 50%  of this time was spent counseling and coordinating care related to the above assessment and plan.  Wadie Lessen, NP  Please contact Palliative Medicine Team phone at [phone] for questions and concerns.

## 2016-10-02 NOTE — Progress Notes (Signed)
PULMONARY  / CRITICAL CARE MEDICINE  Name: [name]. MRN: [id number] DOB: 25-Mar-1935    LOS: 4  REFERRING MD :  Dr. Harvie Bridge  CHIEF COMPLAINT:  Respiratory failure  BRIEF PATIENT DESCRIPTION: [name]. is a 82 y.o. male COPD, acute on chronic HFpEF, CAD status post CABG, aortic valve replacement and pacemaker placement, chronic atrial fibrillation on Coumadin who initially presented on 09/08/2016 with altered mentation in the setting of accidental medication overdose (ativan, haldol and benadryl). His mental status gradually improved through the past week. CXR showed bilateral pleural effusions and signs of pulmonary edema. He underwent thoracentesis 4/5 morning with removal of 1.4L of transudative fluid. For the past couple of days he has been getting furosemide therapy. This morning, he was hypoxic on monitor with altered mental status and Code Blue was called for intubation. After arrival in MICU, patient was responsive and moving all extremities while intubated.  LINES / TUBES: NG, foley ETT 4/8 PIC 4/3 >> PICC 4/8>>  CULTURES: Rt effusion culture- 4/5 >> NGTD  ANTIBIOTICS: unasyn 3/30-4/4 finished course. No fevers.   Imaging:  CXR 4/5>> no change in diffuse pulmonary edema and b/l pleural effusions  CXR 4/11 - b/l pleural effusion.  ECHO 4/10: normal EF, reduced RV function, PA pressure 45   INTERVAL HISTORY: Left LE mottled, vasc sx recommended AKA but family wants comfort. Agitation overnight, trying to pull out ETT, controlled on precedex gtt and has restraints.   VITAL SIGNS: Temp:  [98.1 F (36.7 C)-100.7 F (38.2 C)] 100.7 F (38.2 C) (04/12 0742) Pulse Rate:  [43-69] 57 (04/12 0600) Resp:  [14-21] 14 (04/12 0600) BP: (105-143)/(55-93) 128/58 (04/12 0600) SpO2:  [85 %-100 %] 100 % (04/12 0600) FiO2 (%):  [30 %] 30 % (04/12 0330) Weight:  [162 lb 4.1 oz (73.6 kg)] 162 lb 4.1 oz (73.6 kg) (04/12 0500) HEMODYNAMICS:   VENTILATOR  SETTINGS: Vent Mode: PRVC FiO2 (%):  [30 %] 30 % Set Rate:  [16 bmp] 16 bmp Vt Set:  [580 mL] 580 mL PEEP:  [5 cmH20] 5 cmH20 Plateau Pressure:  [17 cmH20-21 cmH20] 18 cmH20 INTAKE / OUTPUT: Intake/Output      04/11 0701 - 04/12 0700 04/12 0701 - 04/13 0700   I.V. (mL/kg) 930.3 (12.6)    Total Intake(mL/kg) 930.3 (12.6)    Urine (mL/kg/hr) 675 (0.4)    Total Output 675     Net +255.3            PHYSICAL EXAMINATION: General:  Elderly, NAD, sedated Neuro:  RASS -1.  HEENT:  ETT, moist mucous membranes Cardiovascular:  Bradycardic, LLL sternal murmur  Lungs:  CTAB, no wheezing or crackles Abdomen:   Non TTP, soft Ext: Left lower leg cold, unable to palpate pulse, wound dressings dry. Rt leg warm, unable to palpate pulse but warm and perfused    LABS: Cbc  Recent Labs Lab 09/29/16 0424 09/30/16 0536 10/02/16 0550  WBC 9.5 8.4 10.2  HGB 9.1* 8.5* 9.3*  HCT 31.1* 29.0* 31.7*  PLT 170 164 169    Chemistry   Recent Labs Lab 09/28/16 0858  09/30/16 0536 10/01/16 0900 10/01/16 1336 10/02/16 0550  NA 144  < > 147* 149* 148* 149*  K 3.5  < > 3.2* 2.8* 3.8 3.1*  CL 99*  < > 102 104 107 109  CO2 37*  < > 36* 34* 34* 32  BUN 26*  < > 12 15 16 17   CREATININE 0.98  < >  0.85 1.00 1.02 1.14  CALCIUM 9.1  < > 8.7* 8.6* 8.4* 8.0*  MG 1.7  --  1.5*  --  1.4* 1.5*  PHOS 1.9*  --  1.7*  --   --  2.7  GLUCOSE 195*  < > 139* 189* 180* 174*  < > = values in this interval not displayed.  Liver fxn  Recent Labs Lab 09/28/16 0140  AST 64*  ALT 59  ALKPHOS 284*  BILITOT 0.5  PROT 6.5  ALBUMIN 1.8*   coags  Recent Labs Lab 09/29/16 1200  INR 1.44   Sepsis markers No results for input(s): LATICACIDVEN, PROCALCITON in the last 168 hours. Cardiac markers  Recent Labs Lab 09/28/16 0140  TROPONINI 0.09*   BNP No results for input(s): PROBNP in the last 168 hours. ABG  Recent Labs Lab 09/28/16 0140  PHART 7.327*  PCO2ART 66.6*  PO2ART 269*  HCO3 33.9*     CBG trend  Recent Labs Lab 09/30/16 2033 10/01/16 0016 10/01/16 0404 10/01/16 0744 10/01/16 1555  GLUCAP 155* 156* 167* 169* 152*    IMAGING:  ECG:  DIAGNOSES: Principal Problem:   Overdose, accidental or unintentional, initial encounter Active Problems:   DM (diabetes mellitus) type II uncontrolled, periph vascular disorder ([name])   Obstructive sleep apnea   Essential hypertension   CAD (coronary artery disease)   Chronic atrial fibrillation ([name])   Pleural effusion, left   Anemia   Chronic respiratory failure with hypoxia ([name])   AKI (acute kidney injury) ([place])   Accidental overdose   Acute respiratory failure with hypoxia ([name])   Pressure injury of skin   Protein-calorie malnutrition, severe   DNR (do not resuscitate)   Palliative care by specialist   Acute pulmonary edema ([place])   Agitation   ASSESSMENT / PLAN:  PULMONARY A: Acute hypoxic respiratory failure with pulmonary edema and pleural effusions On home 2L O2 Recurrent rt pleural effusion s/p thoracentesis --4/5 yielded 1.4L clear, yellow fluid; also thoracentesis on 3/31 COPD Severe pulmonary htn, CPAP qhs at home P:   He is able to initate breathing however he is too sedated. Family does not want him to be less sedated because they don't want to see him struggle. Will continue mechanical ventilation, family is deciding regarding terminal extubation.  VAP bundle and lung protective ventilation  CARDIOVASCULAR A:  HFpEF, s/p aortic valve replacement, s/p CABG PPM Chronic atrial fibrillation PVD ECHO 4/10: EF 50-55%, PA pressure 45, RV systolic func reduced.  P:  Cont IV lasix, be careful as he is likely preload depended with pHTN. Hold lasix if BP drops.  LLE ischemia, needs AKA but decided on comfort.  Hold heparin for now as family deciding GOC.   RENAL A:   CKD  Hypernatremia worsening every day hypok+ P:   crt bumped slightly. May need break from diuresis  today?   GASTROINTESTINAL A:   Mild abdominal pain P:   Will monitor OG tube, consider TF depending on family decision about GOC (hold if doing terminal ext).   HEMATOLOGIC A:   chronic anemia b/l ~9, stable.  Needs anticoag for Afib, but not on due to bleeding risk.  P:  Cont to monitor. Holding heparin.    INFECTIOUS A:   Multiple skin wounds/ulcers Low suspicion for pulmonary infection  P:   Received 6 days of unasyn for possible asp pna Wound care following Needs AKA for LLE ischemia but decided on comfort.  ENDOCRINE A:   Diabetes mellitus P:  SSI   NEUROLOGIC A:   Acute encephalopathy in setting of hypoxemia, nonfocal exam Chronic pain  P:   Morphine prn, fentanyl gtt dexmedetomidine while intubated to use for RASS goal 0  Dellia Nims, M.D  PGY-3.    10/02/2016, 7:45 AM

## 2016-10-02 NOTE — Progress Notes (Signed)
Spoke with family per their request. Family had questions about continuing lasix and stopping nutrition.   Family verbalized that their main goal at this time for patient is comfort.  Sister wanted [place] to continue his lasix as she did not want him to feel like he was drowning at any point or to pass that way even when sedated; I assured her that the lasix has been continued. I explained that most medicines other than strict comfort meds are stopped, however family wants to continue this.   Sister was also concerned about stopping tube feeds and not even providing IV nutrition; all family members were satisfied with explanation of causing more discomfort and not being congruent with goals of comfort in this sedated patient.   Family agrees that patient appears very comfortable; I assured them that we will adjust medications to maximize comfort as needed.   Wife stated she would like to be notified if patient passes during the night.  Alphonzo Grieve, MD PCCM - PGY1

## 2016-10-03 DIAGNOSIS — R0602 Shortness of breath: Secondary | ICD-10-CM

## 2016-10-03 DIAGNOSIS — J690 Pneumonitis due to inhalation of food and vomit: Secondary | ICD-10-CM

## 2016-10-03 MED ORDER — LORAZEPAM 2 MG/ML IJ SOLN
1.0000 mg/h | INTRAVENOUS | Status: DC
  Administered 2016-10-03: 1 mg/h via INTRAVENOUS
  Filled 2016-10-03: qty 25

## 2016-10-03 MED ORDER — LORAZEPAM BOLUS VIA INFUSION
1.0000 mg | INTRAVENOUS | Status: DC | PRN
  Administered 2016-10-03: 1 mg via INTRAVENOUS
  Filled 2016-10-03: qty 1

## 2016-10-03 MED ORDER — FENTANYL BOLUS VIA INFUSION
50.0000 ug | INTRAVENOUS | Status: DC | PRN
  Filled 2016-10-03: qty 50

## 2016-10-03 MED ORDER — GLYCOPYRROLATE 0.2 MG/ML IJ SOLN
0.2000 mg | INTRAMUSCULAR | Status: DC | PRN
  Administered 2016-10-03: 0.2 mg via INTRAVENOUS
  Filled 2016-10-03: qty 1

## 2016-10-03 NOTE — Progress Notes (Signed)
Palliative care progress note  Reason for visit: Terminal care, symptom management including pain, dyspnea, agitation,and anxiety s/p terminal extubation  I examined [name] today in conjunction with Larina Bras from PMT.  Discussed case with resident as well as his bedside RN.  I called and spoke with the patient's wife, [name].  She reports being upset that somebody last evening came and spoke about him dying in front of her husband. She understands and he is in the process of dying but does not want this discussed in front of him.  We discussed his care overnight including the fact that he is still on Precedex as well as fentanyl.  I talked with her about transitioning to Ativan and fentanyl as this would allow for him to be transitioned out of the ICU while continuing to focus on his comfort.  She had a lot of questions and concerns regarding this as well as other treatments.   We specifically discussed his pacemaker. She reports being concerned as she was told that it needs to be deactivated to ensure his comfort and not "shock" him. I reviewed the chart, including Op note from dec with PPM generator change.  It appears he has a pacemaker and not a defibrillator.  As it is a pacemaker, it will not cause shock or discomfort nor will it prevent death from occurring.  Therefore would not pursue deactivation of pacemaker (per family preference and no real medical need to do so).  Is also very concerned about him continuing to receive Lasix. I do not see a need to stop this at this point.  We also discussed nutrition as she reports her daughter is concerned he is not receiving nutrition at this time. I discussed with her that his prognosis remains a matter of hours to potentially days and we are therefore at as point where continuing to forcibly provide nutrition is more likely to cause him an additional distress (in the form of fluid overload and third spacing with swelling) that it is to cause  comfort for him.  She was accepting of this explanation.  I discussed with resident taking care of him today and placed orders to transition from Precedex to Ativan infusion.  I would continue to watch him in the ICU next several hours while continuing to titrate medications and transition to Ativan infusion.  If he is comfortable and appears clinically stable for moving, I would recommend transitioning to 6N later today (I told his wife probably after 5pm).    Melanee Henslee (RN from our team) will follow up this afternoon.  Total time: 45 minutes Greater than 50%  of this time was spent counseling and coordinating care related to the above assessment and plan.  Micheline Rough, MD [place] Team [phone]

## 2016-10-03 NOTE — Progress Notes (Signed)
Palliative Medicine RN Note: Follow up from Dr Kirstie Mirza visit this morning. Ativan and fentanyl are infusing. Pt is relaxed, no grimacing, no guarding (PAINAD 0). No family is at bedside. Discussed with RN Shea Stakes.  Marjie Skiff Enda Santo, RN, BSN, [facility] 10/03/2016 2:02 PM Cell [phone] 8:00-4:00 Monday-Friday Office [phone]

## 2016-10-03 NOTE — Progress Notes (Signed)
Nutrition Brief Note  Chart reviewed. Pt now transitioning to comfort care.  TF has been discontinued. No further nutrition interventions warranted at this time.  Please re-consult as needed.   Molli Barrows, RD, LDN, [place] Pager [phone] After Hours Pager [phone]

## 2016-10-03 NOTE — Progress Notes (Signed)
PULMONARY  / CRITICAL CARE MEDICINE  Name: [name]. MRN: [id number] DOB: 06-09-1935    LOS: 57  REFERRING MD :  Dr. Harvie Bridge  CHIEF COMPLAINT:  Respiratory failure  BRIEF PATIENT DESCRIPTION: [name]. is a 82 y.o. male COPD, acute on chronic HFpEF, CAD status post CABG, aortic valve replacement and pacemaker placement, chronic atrial fibrillation on Coumadin who initially presented on 09/07/2016 with altered mentation in the setting of accidental medication overdose (ativan, haldol and benadryl). His mental status gradually improved through the past week. CXR showed bilateral pleural effusions and signs of pulmonary edema. He underwent thoracentesis 4/5 morning with removal of 1.4L of transudative fluid. For the past couple of days he has been getting furosemide therapy. This morning, he was hypoxic on monitor with altered mental status and Code Blue was called for intubation. After arrival in MICU, patient was responsive and moving all extremities while intubated.  LINES / TUBES: NG, foley ETT 4/8 >> 4/12.  PIC 4/3 >> PICC 4/8>>  CULTURES: Rt effusion culture- 4/5 >> NGTD  ANTIBIOTICS: unasyn 3/30-4/4 finished course. No fevers.   Imaging:  CXR 4/5>> no change in diffuse pulmonary edema and b/l pleural effusions  CXR 4/11 - b/l pleural effusion.  ECHO 4/10: normal EF, reduced RV function, PA pressure 45   INTERVAL HISTORY: transitioned to full comfort care yesterday. Remained on precedex and fent overnight.   VITAL SIGNS: Temp:  [98 F (36.7 C)] 98 F (36.7 C) (04/12 1545) Pulse Rate:  [53-61] 58 (04/13 0900) Resp:  [8-26] 12 (04/13 0900) BP: (107-122)/(56-61) 122/56 (04/12 1600) SpO2:  [60 %-100 %] 68 % (04/13 0900) FiO2 (%):  [30 %] 30 % (04/12 1600) Weight:  [162 lb 4.1 oz (73.6 kg)] 162 lb 4.1 oz (73.6 kg) (04/13 0500) HEMODYNAMICS:   VENTILATOR SETTINGS: Vent Mode: PSV;CPAP FiO2 (%):  [30 %] 30 % PEEP:  [5 cmH20] 5 cmH20 Pressure  Support:  [10 cmH20] 10 cmH20 INTAKE / OUTPUT: Intake/Output      04/12 0701 - 04/13 0700 04/13 0701 - 04/14 0700   I.V. (mL/kg) 594.6 (8.1) 44.3 (0.6)   IV Piggyback 50    Total Intake(mL/kg) 644.6 (8.8) 44.3 (0.6)   Urine (mL/kg/hr) 80 (0)    Total Output 80     Net +564.6 +44.3          PHYSICAL EXAMINATION: General:  Elderly, NAD, comfort care, sedated Neuro:  RASS -2.  HEENT:  [place]/at, moist mucous membranes Cardiovascular:  Bradycardic, LLL sternal murmur  Lungs:  CTAB, no wheezing or crackles Abdomen:   Non TTP, soft Ext: Left lower leg cold, unable to palpate pulse, wound dressings dry. Rt leg warm, unable to palpate pulse but warm and perfused    LABS: Cbc  Recent Labs Lab 09/29/16 0424 09/30/16 0536 10/02/16 0550  WBC 9.5 8.4 10.2  HGB 9.1* 8.5* 9.3*  HCT 31.1* 29.0* 31.7*  PLT 170 164 169    Chemistry   Recent Labs Lab 09/28/16 0858  09/30/16 0536 10/01/16 0900 10/01/16 1336 10/02/16 0550  NA 144  < > 147* 149* 148* 149*  K 3.5  < > 3.2* 2.8* 3.8 3.1*  CL 99*  < > 102 104 107 109  CO2 37*  < > 36* 34* 34* 32  BUN 26*  < > 12 15 16 17   CREATININE 0.98  < > 0.85 1.00 1.02 1.14  CALCIUM 9.1  < > 8.7* 8.6* 8.4* 8.0*  MG 1.7  --  1.5*  --  1.4* 1.5*  PHOS 1.9*  --  1.7*  --   --  2.7  GLUCOSE 195*  < > 139* 189* 180* 174*  < > = values in this interval not displayed.  Liver fxn  Recent Labs Lab 09/28/16 0140  AST 64*  ALT 59  ALKPHOS 284*  BILITOT 0.5  PROT 6.5  ALBUMIN 1.8*   coags  Recent Labs Lab 09/29/16 1200  INR 1.44   Sepsis markers No results for input(s): LATICACIDVEN, PROCALCITON in the last 168 hours. Cardiac markers  Recent Labs Lab 09/28/16 0140  TROPONINI 0.09*   BNP No results for input(s): PROBNP in the last 168 hours. ABG  Recent Labs Lab 09/28/16 0140  PHART 7.327*  PCO2ART 66.6*  PO2ART 269*  HCO3 33.9*    CBG trend  Recent Labs Lab 09/30/16 2033 10/01/16 0016 10/01/16 0404 10/01/16 0744  10/01/16 1555  GLUCAP 155* 156* 167* 169* 152*     DIAGNOSES: Principal Problem:   Overdose, accidental or unintentional, initial encounter Active Problems:   DM (diabetes mellitus) type II uncontrolled, periph vascular disorder ([name])   Obstructive sleep apnea   Essential hypertension   CAD (coronary artery disease)   Chronic atrial fibrillation ([name])   Pleural effusion, left   Anemia   Chronic respiratory failure with hypoxia ([name])   AKI (acute kidney injury) ([place])   Accidental drug overdose   Acute respiratory failure with hypoxia ([name])   Pressure injury of skin   Protein-calorie malnutrition, severe   DNR (do not resuscitate)   Palliative care by specialist   Acute pulmonary edema ([place])   Agitation   ASSESSMENT / PLAN:  PULMONARY A: Acute hypoxic respiratory failure with pulmonary edema and pleural effusions On chronic home 2L O2 Recurrent rt pleural effusion s/p thoracentesis --4/5 yielded 1.4L clear, yellow fluid; also thoracentesis on 3/31 COPD Severe pulmonary htn, CPAP qhs at home P:   Terminally Extubated on 4/12, He is transitioned to full comfort care  CARDIOVASCULAR A:  HFpEF, s/p aortic valve replacement, s/p CABG PPM Chronic atrial fibrillation PVD ECHO 4/10: EF 50-55%, PA pressure 45, RV systolic func reduced.  P:  Cont IV lasix per family request al though he is comfort care.  Hold heparin as comfort.  No telemetry or other monitoring.   RENAL A:   CKD  Not checking labs due to comfort measures.  P:   No further labs.    GASTROINTESTINAL A:   Mild abdominal pain P:   No tube feeds.   HEMATOLOGIC A:   chronic anemia b/l ~9 Hx of afib, not on anticoag due to comfort measures.  P:  Holding heparin, no lab checks.     INFECTIOUS A:   Multiple skin wounds/ulcers Low suspicion for pulmonary infection Ischemic left leg.  P:   Received 6 days of unasyn for possible asp pna   ENDOCRINE A:   Diabetes mellitus P:   No  intervention as he is comfort.   NEUROLOGIC A:   Acute encephalopathy in setting of hypoxemia, nonfocal exam Chronic pain  P:   Family insisted on fentanyl gtt rather than morphine and the usual comfort meds. Also on precedex. Will speak to family about turning off precedex and just doing fentanyl or morphine and then transfer him to 6N for comfort measures.   Dellia Nims, M.D  PGY-3.    10/03/2016, 9:38 AM

## 2016-10-06 ENCOUNTER — Telehealth: Payer: Self-pay | Admitting: Pulmonary Disease

## 2016-10-06 NOTE — Telephone Encounter (Signed)
Called and spoke with [name] and he stated that they are needing a dx that would be related to the OD and the death of the pt.  He stated that he spoke with Dr. Domingo Cocking and that the pt did have some pulmonary edema.  AD please advise. thnaks

## 2016-10-07 NOTE — Telephone Encounter (Signed)
Patient's  Main issue:   Acute hypoxemic respiratory failure secondary to acute pulmonary edema, concern for healthcare associated pneumonia. Patient with significant cardiac comorbidities with heart failure, atrial fibrillation, valvular heart disease. Patient also with diabetes, anemia. Patient also has peripheral vascular disease and his left lower extremity was cool and cyanotic.   pls let me know if you need more diagnosis  J. Shirl Harris, MD 10/07/2016, 12:14 AM [facility] Pager [phone] After 3 pm or if no answer, call [phone]

## 2016-10-07 NOTE — Telephone Encounter (Signed)
Spoke with [name] and informed him of AD message. He wrote everything down and had no further questions at this time. Nothing further is needed

## 2016-10-21 NOTE — Progress Notes (Addendum)
Patient pronounced dead at 36 with A. Brickhouse, RN confirming. Wasted 36 mL of Ativan 50 mg in D5 in 50 mL and 160 mL of Fentanyl 2500 mcg in NS in 250 mL in sink with A. Brickhouse.  On-call PCCM MD Mauri Brooklyn was made aware.  Called on-call ME Izora Ribas and informed this RN patient is a ME case.  Spoke to [place] [facility] [employer] and provided the referral number [phone] informing this RN that patient is not a potential donor.

## 2016-10-21 NOTE — Discharge Summary (Signed)
PULMONARY / CRITICAL CARE MEDICINE   Name: [name]. MRN: [id number] DOB: [birth year]    ADMISSION DATE:  08/30/2016 CONSULTATION DATE:  09/28/16  REFERRING MD:  Dr. Harvie Bridge  CHIEF COMPLAINT:  Respiratory failure  HISTORY OF PRESENT ILLNESS:   [name]. is a 82 y.o. male COPD, acute on chronic HFpEF, CAD status post CABG, aortic valve replacement and pacemaker placement, chronic atrial fibrillation on Coumadin who initially presented on 08/30/2016 with altered mentation in the setting of accidental medication overdose (ativan, haldol and benadryl). His mental status gradually improved through the past week. CXR showed bilateral pleural effusions and signs of pulmonary edema. He underwent thoracentesis 4/5 morning with removal of 1.4L of transudative fluid. For the past couple of days he has been getting furosemide therapy. This morning, he was hypoxic on monitor with altered mental status and Code Blue was called for intubation. After arrival in MICU, patient was responsive and moving all extremities while intubated.  PAST MEDICAL HISTORY :  He  has a past medical history of Aortal stenosis (2006); Arthritis; Atrial fibrillation ([place]); Carotid artery disease ([place]) (09/27/2010); CHF (congestive heart failure) ([place]); Complete heart block ([place]); Coronary artery disease; DM type 2 (diabetes mellitus, type 2) ([place]); Erectile dysfunction; Gangrene ([place]); History of nephrolithiasis; HTN (hypertension); Hyperlipidemia; Memory loss; Obesity; OSA (obstructive sleep apnea); Pacemaker (2/07); PVD (peripheral vascular disease) ([place]); S/P aortic valve replacement; Sick sinus syndrome ([place]); and Skin cancer.  PAST SURGICAL HISTORY: He  has a past surgical history that includes Aortic valve replacement; Coronary artery bypass graft (2006); Tonsillectomy; Cataract extraction, bilateral; Amputation (01/2010); pacemaker placement (07/2005); Aortic valve replacement (01/17/2011); Angioplasty /  stenting femoral (02/10/10); Incise and drain abcess (Right, May 2014); Eye surgery; abdominal aortagram (N/A, 11/10/2012); lower extremity angiogram (Left, 11/23/2012); Cardiac catheterization (N/A, 06/06/2016); Cardiac catheterization (N/A, 07/10/2016); Cardiac catheterization (Bilateral, 07/10/2016); Aortogram (N/A, 07/16/2016); Amputation (Left, 07/16/2016); Iliac atherectomy (Left, 07/16/2016); Angioplasty illiac artery (Left, 07/16/2016); Ultrasound guidance for vascular access (Bilateral, 07/16/2016); Wound debridement (Left, 07/31/2016); Amputation (Left, 07/31/2016); Application if wound vac (Left, 07/31/2016); Esophagogastroduodenoscopy (egd) with propofol (N/A, 08/07/2016); and IR THORACENTESIS ASP PLEURAL SPACE W/IMG GUIDE (09/25/2016).  Allergies  Allergen Reactions  . Potassium-Containing Compounds Anaphylaxis    IV--loss of conciousness  . Metoprolol Succinate Other (See Comments)    Extremely tired    No current facility-administered medications on file prior to encounter.    Current Outpatient Prescriptions on File Prior to Encounter  Medication Sig  . acetaminophen (TYLENOL) 325 MG tablet Take 650 mg by mouth 3 (three) times daily. 6AM, 2PM, 10PM  . albuterol (PROVENTIL) (2.5 MG/3ML) 0.083% nebulizer solution Take 3 mLs (2.5 mg total) by nebulization every 4 (four) hours as needed for wheezing or shortness of breath.  Marland Kitchen ampicillin (PRINCIPEN) 500 MG capsule Take 500 mg by mouth every 6 (six) hours. 10 day course ordered 09/12/16 for wound infection in left foot  . aspirin EC 81 MG EC tablet Take 1 tablet (81 mg total) by mouth daily.  . diphenhydrAMINE (BENADRYL) 25 MG tablet Take 25 mg by mouth See admin instructions. Take 1 tablet (25 mg) by mouth every 8 hours for allergic reaction - order date 09/10/16  . docusate sodium (COLACE) 100 MG capsule Take 1 capsule (100 mg total) by mouth 2 (two) times daily.  Marland Kitchen donepezil (ARICEPT) 10 MG tablet Take 1 tablet (10 mg total) by mouth at bedtime.  Marland Kitchen  ezetimibe-simvastatin (VYTORIN) 10-40 MG per tablet Take 1 tablet by mouth at  bedtime.  . ferrous sulfate 325 (65 FE) MG tablet Take 1 tablet (325 mg total) by mouth 2 (two) times daily with a meal.  . furosemide (LASIX) 40 MG tablet Take 1 tablet (40 mg total) by mouth 2 (two) times daily.  . insulin glargine (LANTUS) 100 UNIT/ML injection Inject 0.05 mLs (5 Units total) into the skin at bedtime.  . insulin lispro (HUMALOG) 100 UNIT/ML injection Inject 0-9 Units into the skin See admin instructions. Inject 1-9 unit subcutaneously three time daily with meals per sliding scale: CBG <70 initiate hypoglycemic protocol, 70-120 0 units, 121-150 1 unit, 151-200 2 units, 201-250 3 units, 251-300 5 units, 301-350 7 units, 351-400 9 units, >400 Call MD  . ipratropium (ATROVENT) 0.02 % nebulizer solution Take 2.5 mLs (0.5 mg total) by nebulization every 4 (four) hours as needed for wheezing or shortness of breath.  . metoprolol tartrate (LOPRESSOR) 25 MG tablet Take 1 tablet (25 mg total) by mouth 2 (two) times daily.  . Multiple Vitamins-Minerals (DECUBI-VITE) CAPS Take 1 capsule by mouth daily.  Marland Kitchen nystatin (MYCOSTATIN/NYSTOP) powder Apply topically See admin instructions. Apply topically to groin three times daily for irritation  . pantoprazole (PROTONIX) 40 MG tablet Take 1 tablet (40 mg total) by mouth 2 (two) times daily.  . potassium chloride SA (K-DUR,KLOR-CON) 20 MEQ tablet Take 20 mEq by mouth 2 (two) times daily.  . QUEtiapine (SEROQUEL) 50 MG tablet Take 50 mg by mouth at bedtime.    FAMILY HISTORY:  His indicated that his mother is deceased. He indicated that his father is deceased. He indicated that two of his four sisters are alive. He indicated that the status of his neg hx is unknown.    SOCIAL HISTORY: He  reports that he quit smoking about 28 years ago. His smoking use included Cigarettes. He has quit using smokeless tobacco. He reports that he does not drink alcohol or use  drugs.  REVIEW OF SYSTEMS:   Unable to be obtained in intubated patient   VITAL SIGNS: BP (!) 64/44 (BP Location: Left Leg)   Pulse (!) 58   Temp 98 F (36.7 C) (Oral)   Resp 12   Ht 5\' 10"  (1.778 m)   Wt 162 lb 4.1 oz (73.6 kg)   SpO2 (!) 76%   BMI 23.28 kg/m   HEMODYNAMICS:    VENTILATOR SETTINGS:    INTAKE / OUTPUT: No intake/output data recorded.  PHYSICAL EXAMINATION: General:  Alert, unable to assess orientation, intubated Neuro:  Moves all extremities with normal strength, PERRL HEENT:  MMM, ETT in place Cardiovascular:  Normal rate and rhythm, no murmurs rubs or gallops Lungs:  Coarse crackles at bases, symmetric chest excursion Abdomen:  Soft, slightly tender to palpation, no rebound guarding or distention Musculoskeletal:  Aputated left great toe Skin:  Multiple ecchymoses on arms  LABS:  BMET No results for input(s): NA, K, CL, CO2, BUN, CREATININE, GLUCOSE in the last 168 hours.  Electrolytes No results for input(s): CALCIUM, MG, PHOS in the last 168 hours.  CBC No results for input(s): WBC, HGB, HCT, PLT in the last 168 hours.  Coag's No results for input(s): APTT, INR in the last 168 hours.  Sepsis Markers No results for input(s): LATICACIDVEN, PROCALCITON, O2SATVEN in the last 168 hours.  ABG No results for input(s): PHART, PCO2ART, PO2ART in the last 168 hours.  Liver Enzymes No results for input(s): AST, ALT, ALKPHOS, BILITOT, ALBUMIN in the last 168 hours.  Cardiac Enzymes No results for  input(s): TROPONINI, PROBNP in the last 168 hours.  Glucose No results for input(s): GLUCAP in the last 168 hours.  Imaging No results found. DISCUSSION: is a 82 y.o. male COPD, acute on chronic HFpEF, CAD status post CABG, aortic valve replacement and pacemaker placement, chronic atrial fibrillation on Coumadin who initially presented on 08/28/2016 with altered mentation in the setting of accidental medication overdose (ativan, haldol and benadryl).  Cause of his altered mental status is unknown. He has a non-focal neurologic exam currently but cannot rule out TIA. Hypoxia may have been from decreased respiratory drive vs worsening of underlying lung disease, such as with reaccumulation of pleural effusion or pulmonary edema.   ASSESSMENT / PLAN:  PULMONARY A: Acute hypoxic respiratory failure with pulmonary edema and pleural effusions P:   Will continue mechanical ventilation VAP bundle and lung protective ventilation  CARDIOVASCULAR A:  HFpEF, s/p aortic valve replacement, s/p CABG Chronic atrial fibrillation P:  Continued IV diuresis for fluid overload Continue anticoagulation  RENAL A:   CKD P:   Tolerating diuresis with improving creatinine  GASTROINTESTINAL A:   Mild abdominal pain P:   Will monitor OG tube  HEMATOLOGIC A:   Systemic anticoagulation, held due to high bleeding risk P:  Will reassess  INFECTIOUS A:   No issues Low suspicion for pulmonary infection P:   Continue to monitor  ENDOCRINE A:   Diabetes mellitus P:   SSI   NEUROLOGIC A:   Acute encephalopathy in setting of hypoxemia, nonfocal exam. Consideration of TIA P:   STAT CT Head ordered Fentanyl, midazolam PRN while intubated to use for RASS goal 0  FAMILY  -  : Wife updated at bedside  - Inter-disciplinary family meet or Palliative Care meeting due by:  10/05/16  60 mins critical care time spent  Beverely Low MD Pulmonary and [place] Pager: [phone]  10/09/2016, 1:04 PM  Addendum :  Pt had a complicated ICU case.  He was treated as acute pulmonary edema as well as possible HCAP.  He had acute PVD affecting his lower extremitiy.  Surgery was recommended but given his over all prognosis and significant co-morbidities, family decided to hold off on surgery.  Patient was made DNR and eventually, family made him comfort care.  Pls see last PN below:  PULMONARY  / CRITICAL CARE  MEDICINE  Name: [name]. MRN: [id number] DOB: April 23, 1935    LOS: 57  REFERRING MD :  Dr. Harvie Bridge  CHIEF COMPLAINT:  Respiratory failure  BRIEF PATIENT DESCRIPTION: [name]. is a 82 y.o. male COPD, acute on chronic HFpEF, CAD status post CABG, aortic valve replacement and pacemaker placement, chronic atrial fibrillation on Coumadin who initially presented on 08/21/2016 with altered mentation in the setting of accidental medication overdose (ativan, haldol and benadryl). His mental status gradually improved through the past week. CXR showed bilateral pleural effusions and signs of pulmonary edema. He underwent thoracentesis 4/5 morning with removal of 1.4L of transudative fluid. For the past couple of days he has been getting furosemide therapy. This morning, he was hypoxic on monitor with altered mental status and Code Blue was called for intubation. After arrival in MICU, patient was responsive and moving all extremities while intubated.  LINES / TUBES: NG, foley ETT 4/8 PIC 4/3 >> PICC 4/8>>  CULTURES: Rt effusion culture- 4/5 >> NGTD  ANTIBIOTICS: unasyn 3/30-4/4 finished course. No fevers.   Imaging:  CXR 4/5>> no change in diffuse  pulmonary edema and b/l pleural effusions  CXR 4/11 - b/l pleural effusion.  ECHO 4/10: normal EF, reduced RV function, PA pressure 45   INTERVAL HISTORY: Left LE mottled, vasc sx recommended AKA but family wants comfort. Agitation overnight, trying to pull out ETT, controlled on precedex gtt and has restraints.   VITAL SIGNS:   HEMODYNAMICS:   VENTILATOR SETTINGS:   INTAKE / OUTPUT: Intake/Output    None     PHYSICAL EXAMINATION: General:  Elderly, NAD, sedated Neuro:  RASS -1.  HEENT:  ETT, moist mucous membranes Cardiovascular:  Bradycardic, LLL sternal murmur  Lungs:  CTAB, no wheezing or crackles Abdomen:   Non TTP, soft Ext: Left lower leg cold, unable to palpate pulse, wound dressings dry. Rt leg warm,  unable to palpate pulse but warm and perfused    LABS: Cbc No results for input(s): WBC, HGB, HCT, PLT in the last 168 hours.  Chemistry  No results for input(s): NA, K, CL, CO2, BUN, CREATININE, CALCIUM, MG, PHOS, GLUCOSE in the last 168 hours.  Liver fxn No results for input(s): AST, ALT, ALKPHOS, BILITOT, PROT, ALBUMIN in the last 168 hours. coags No results for input(s): APTT, INR in the last 168 hours. Sepsis markers No results for input(s): LATICACIDVEN, PROCALCITON in the last 168 hours. Cardiac markers No results for input(s): CKTOTAL, CKMB, TROPONINI in the last 168 hours. BNP No results for input(s): PROBNP in the last 168 hours. ABG No results for input(s): PHART, PCO2ART, PO2ART, HCO3, TCO2 in the last 168 hours.  CBG trend No results for input(s): GLUCAP in the last 168 hours.  IMAGING:  ECG:  DIAGNOSES: Principal Problem:   Overdose, accidental or unintentional, initial encounter Active Problems:   DM (diabetes mellitus) type II uncontrolled, periph vascular disorder ([name])   Obstructive sleep apnea   Essential hypertension   CAD (coronary artery disease)   Chronic atrial fibrillation ([name])   Pleural effusion, left   Anemia   Chronic respiratory failure with hypoxia ([name])   AKI (acute kidney injury) ([place])   Accidental drug overdose   Acute respiratory failure with hypoxia ([name])   Pressure injury of skin   Protein-calorie malnutrition, severe   DNR (do not resuscitate)   Palliative care by specialist   Acute pulmonary edema ([place])   Agitation   ASSESSMENT / PLAN:  PULMONARY A: Acute hypoxic respiratory failure with pulmonary edema and pleural effusions On home 2L O2 Recurrent rt pleural effusion s/p thoracentesis --4/5 yielded 1.4L clear, yellow fluid; also thoracentesis on 3/31 COPD Severe pulmonary htn, CPAP qhs at home P:   Will continue mechanical ventilation, family is deciding regarding terminal extubation.  VAP bundle and lung  protective ventilation  CARDIOVASCULAR A:  HFpEF, s/p aortic valve replacement, s/p CABG PPM Chronic atrial fibrillation PVD ECHO 4/10: EF 50-55%, PA pressure 45, RV systolic func reduced.  P:  Cont IV lasix, be careful as he is likely preload depended with pHTN. Hold lasix if BP drops.  LLE ischemia, needs AKA but decided on comfort.  Hold heparin for now as family deciding GOC.   RENAL A:   CKD Hypernatremia worsening every day hypok+ P:   Tolerating diuresis with improving creatinine Recheck BMET today to trend Na+ and K+.    GASTROINTESTINAL A:   Mild abdominal pain P:   Will monitor OG tube, consider TF depending on family decision about GOC (hold if doing terminal ext).   HEMATOLOGIC A:   chronic anemia b/l ~9, stable.  Needs anticoag  for Afib, but not on due to bleeding risk.  P:  Cont to monitor.    INFECTIOUS A:   Multiple skin wounds/ulcers Low suspicion for pulmonary infection  P:   Received 6 days of unasyn for possible asp pna Wound care following Needs AKA for LLE ischemia but decided on comfort.  ENDOCRINE A:   Diabetes mellitus P:   SSI   NEUROLOGIC A:   Acute encephalopathy in setting of hypoxemia, nonfocal exam Chronic pain  P:   Morphine prn, dexmedetomidine while intubated to use for RASS goal 0  Dellia Nims, M.D  PGY-3.    10/09/2016, 1:06 PM    ATTENDING NOTE / ATTESTATION NOTE :   I have discussed the case with the resident/APP  Dr. Genene Churn  I agree with the resident/APP's  history, physical examination, assessment, and plans.    I have edited the above note and modified it according to our agreed history, physical examination, assessment and plan.    Briefly, [name]. is a 82 y.o. male COPD, acute on chronic HFpEF, CAD status post CABG, aortic valve replacement and pacemaker placement, chronic atrial fibrillation on Coumadin who initially presented on 09/16/2016 with altered mentation in the setting of  accidental medication overdose (ativan, haldol and benadryl). His mental status gradually improved through the past week. CXR showed bilateral pleural effusions and signs of pulmonary edema. He underwent thoracentesis 4/5 morning with removal of 1.4L of transudative fluid. For the past couple of days he has been getting furosemide therapy. He had worsening respiratory distress over the weekend requiring intubation. He was intubated and diuresis over the weekend. He was noted to have cyanotic left lower extremity for which vascular surgery has been consulted. Surgery was suggested but patient is very high risk for procedure. Family opted to be conservative.  Palliative care team has been involved. There has been discussions regarding one-way extubation. Per discussions yesterday, plan was to see how patient was doing today and family will decide regarding transitioning him to comfort care today.   Vitals:  Vitals:   10/03/16 0800 10/03/16 0900 10/03/16 1522 Oct 28, 2016 0200  BP:   (!) 64/44   Pulse: (!) 59 (!) 58 (!) 58   Resp: 19 12 12    Temp:      TempSrc:      SpO2: (!) 80% (!) 68% (!) 76%   Weight:    162 lb 4.1 oz (73.6 kg)  Height:        Constitutional/General: Chronically ill,, intubated, sedated, not in any distress  Body mass index is 23.28 kg/m. Wt Readings from Last 3 Encounters:  2016/10/28 162 lb 4.1 oz (73.6 kg)  09/17/16 170 lb (77.1 kg)  09/15/16 169 lb (76.7 kg)    HEENT: PERLA, anicteric sclerae. (-) Oral thrush. Intubated, ETT in place  Neck: No masses. Midline trachea. No JVD, (-) LAD. (-) bruits appreciated.  Respiratory/Chest: Grossly normal chest. (-) deformity. (-) Accessory muscle use.  Symmetric expansion. Diminished BS on both lower lung zones. (-) wheezing, rhonchi Crackles at bases.  (-) egophony  Cardiovascular: Regular rate and  rhythm, heart sounds normal, no murmur or gallops,  Gr 1  peripheral edema  Gastrointestinal:  Normal bowel sounds. Soft,  non-tender. No hepatosplenomegaly.  (-) masses.   Musculoskeletal:  Normal muscle tone.   Extremities: Cyanotic and cool left lower extremity.  (+) edema  Skin: (-) rash,lesions seen.   Neurological/Psychiatric : sedated, intubated. CN grossly intact. (-) lateralizing signs.    CBC  No results for input(s): WBC, HGB, HCT, PLT in the last 72 hours.  Coag's No results for input(s): APTT, INR in the last 72 hours.  BMET No results for input(s): NA, K, CL, CO2, BUN, CREATININE, GLUCOSE in the last 72 hours.  Electrolytes No results for input(s): CALCIUM, MG, PHOS in the last 72 hours.  Sepsis Markers No results for input(s): PROCALCITON, O2SATVEN in the last 72 hours.  Invalid input(s): LACTICACIDVEN  ABG No results for input(s): PHART, PCO2ART, PO2ART in the last 72 hours.  Liver Enzymes No results for input(s): AST, ALT, ALKPHOS, BILITOT, ALBUMIN in the last 72 hours.  Cardiac Enzymes No results for input(s): TROPONINI, PROBNP in the last 72 hours.  Glucose No results for input(s): GLUCAP in the last 72 hours.  Imaging No results found.  Assessment/Plan : Acute hypoxemic respiratory failure secondary to acute pulmonary edema, concern for healthcare associated pneumonia. Patient with significant cardiac comorbidities with heart failure, atrial fibrillation, valvular heart disease. Patient also with diabetes, anemia. Patient also has peripheral vascular disease and his left lower extremity is cool and cyanotic. - We will touch base with family today and discuss regarding overall goals of care. Was told that family is considering transitioning patient to comfort care this afternoon. Palliative care team is involved. - Continue ventilatory support for now. - Continue diuresis. - If patient is not to be comfort care, and remain DNR/DNI, we need to continue monitoring his electrolytes with diuresis, replace as necessary, and consider resuming heparin drip for his peripheral  vascular disease and atrial fibrillation. - Cont tube feeds for now.    I spent 30   minutes of Critical Care time with this patient today. This is my time spent independent of the APP or resident.   Family :  No family at bedside.    Monica Becton, MD 10/09/2016, 1:06 PM [place] [facility] Pager [phone] After 3 pm or if no answer, call [phone]    Addeendum : pt was made comfort care per family and was extubated.  He was pronounced dead at 1:20 am on Oct 14, 2016. Family made aware.    Monica Becton, MD 10/09/2016, 1:08 PM [place] [facility] Pager [phone] After 3 pm or if no answer, call [phone]

## 2016-10-21 NOTE — Progress Notes (Signed)
Spoke to patient's spouse [name] informing her of the patient's death at 8 and instructed this RN to have the patient's body in the room until she and her sister's arrive at [facility] to see the patient's body. Spouse also wants an autopsy.  This RN inform the spouse that patient's death is a [employer] case and ME will do the autopsy while the body is already in the morgue.

## 2016-10-21 NOTE — Progress Notes (Signed)
Obtained Permission for Postmortem (autopsy) Examination Form and called spouse' patient, Barack Nicodemus to come to [facility] to accomplished the Permission for Postmortem (autopsy) Examination Form.  The form will be with the [place] since Dereon Corkery will be coming to [facility] later today.  Will endorse this requirement to day shift Charge RN appropriately.

## 2016-10-21 NOTE — Progress Notes (Signed)
[name] just called this RN informing that she and her sisters will no longer come to [facility] to see patient's body but wanted to be called by 10 AM today at [phone] or [phone] for the autopsy schedule or status.

## 2016-10-21 DEATH — deceased

## 2016-10-27 ENCOUNTER — Ambulatory Visit: Payer: Medicare Other | Admitting: Surgery

## 2016-11-05 ENCOUNTER — Ambulatory Visit: Payer: Medicare Other | Admitting: Adult Health

## 2016-12-12 ENCOUNTER — Ambulatory Visit: Payer: Medicare Other | Admitting: Physician Assistant

## 2017-07-30 IMAGING — DX DG CHEST 1V PORT
1 series · 1 of 1 positions shown · non-contrast
Comparison: Seven days prior

CLINICAL DATA: Hypoxia.

EXAM:
PORTABLE CHEST 1 VIEW

[chest ap]
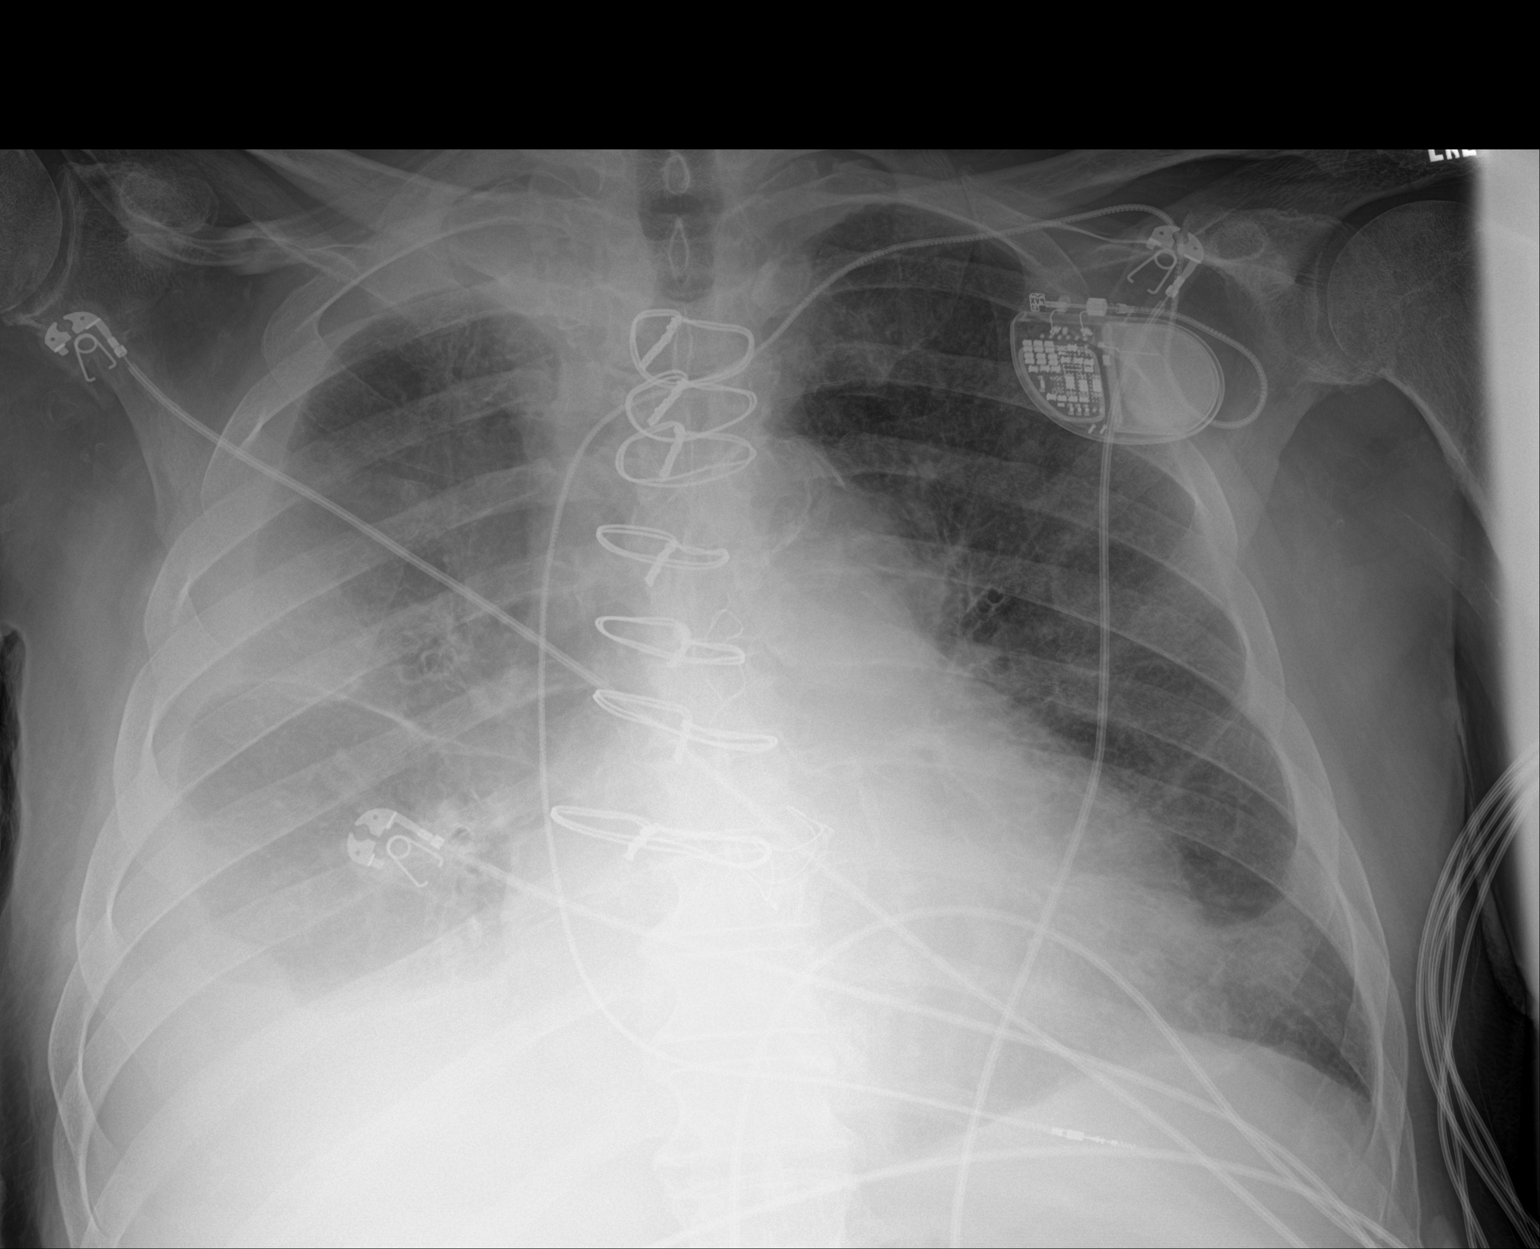

[1 of 1 positions shown; findings below may reference images not displayed]

FINDINGS: Progressive haziness of the right chest with the appearance of
layering pleural effusion, moderate or large. Better aerated left
chest. Status post CABG and aortic valve replacement. Single chamber
pacer from the left in stable position. No pulmonary edema or
pneumothorax noted.
IMPRESSION: 1. Progressed right pleural effusion that is moderate to large.
2. Cardiomegaly.
3. No pulmonary edema.

## 2017-07-31 IMAGING — CR DG CHEST 1V
1 series · 1 of 1 positions shown · non-contrast
Comparison: 08/06/2016

CLINICAL DATA: Post right thoracentesis

EXAM:
CHEST 1 VIEW

[chest ap]
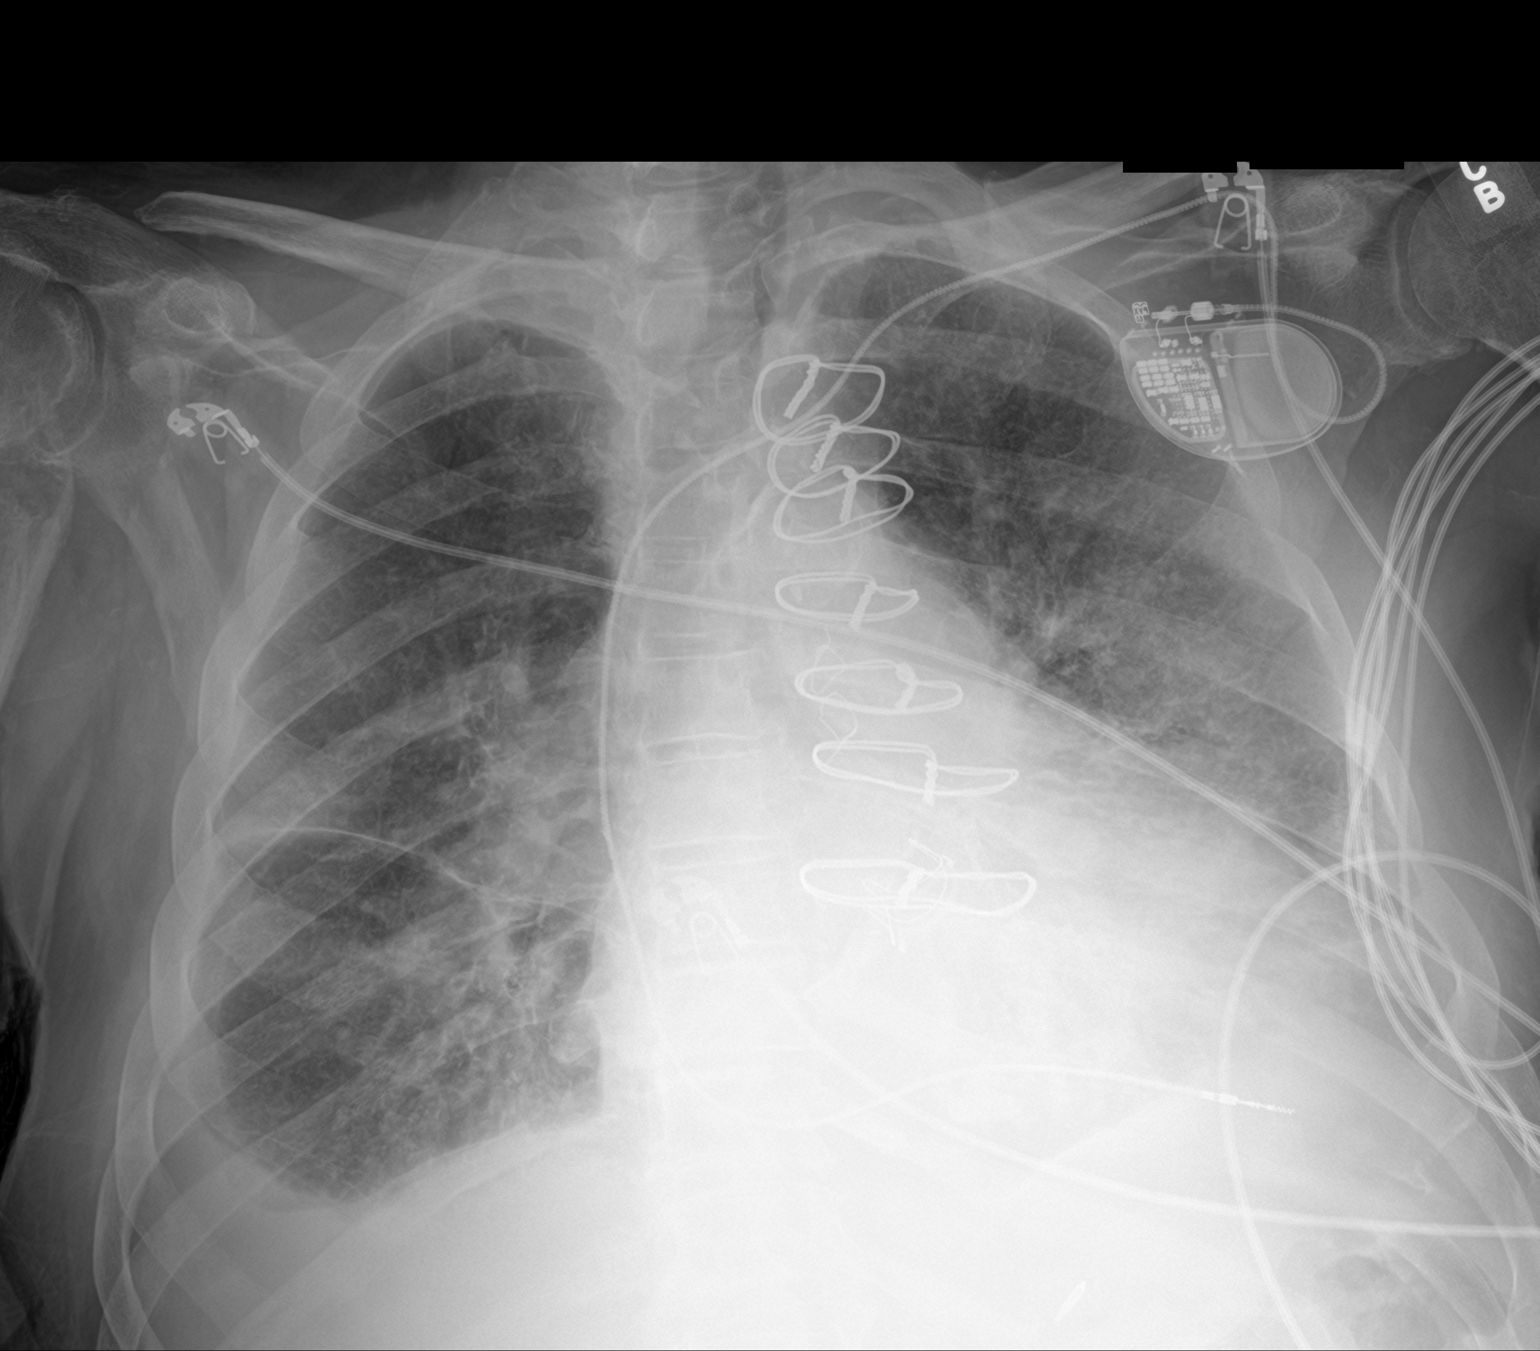

[1 of 1 positions shown; findings below may reference images not displayed]

FINDINGS: Significant decrease in right pleural effusion following
thoracentesis. Small remaining right pleural effusion. No
pneumothorax.

Bilateral airspace disease and vascular congestion most compatible
with pulmonary edema which has progressed in the interval. Single
lead transvenous pacemaker noted. Prior CABG and aortic valve
replacement
IMPRESSION: Decreased right pleural effusion following thoracentesis. No
pneumothorax

Progression of airspace disease consistent with edema.

## 2017-08-02 IMAGING — DX DG SHOULDER 2+V*R*
2 series · 3 of 3 positions shown · non-contrast
Comparison: Right shoulder radiographs performed 07/24/2016

CLINICAL DATA: Acute onset of right arm pain, with right shoulder
deformity. Recently diagnosed right humeral neck fracture.
Subsequent encounter.

EXAM:
RIGHT SHOULDER - 2+ VIEW

[Series 1: shoulder grashey · 0.14mm/px · 2 of 2 slices shown]
[im 1/2]
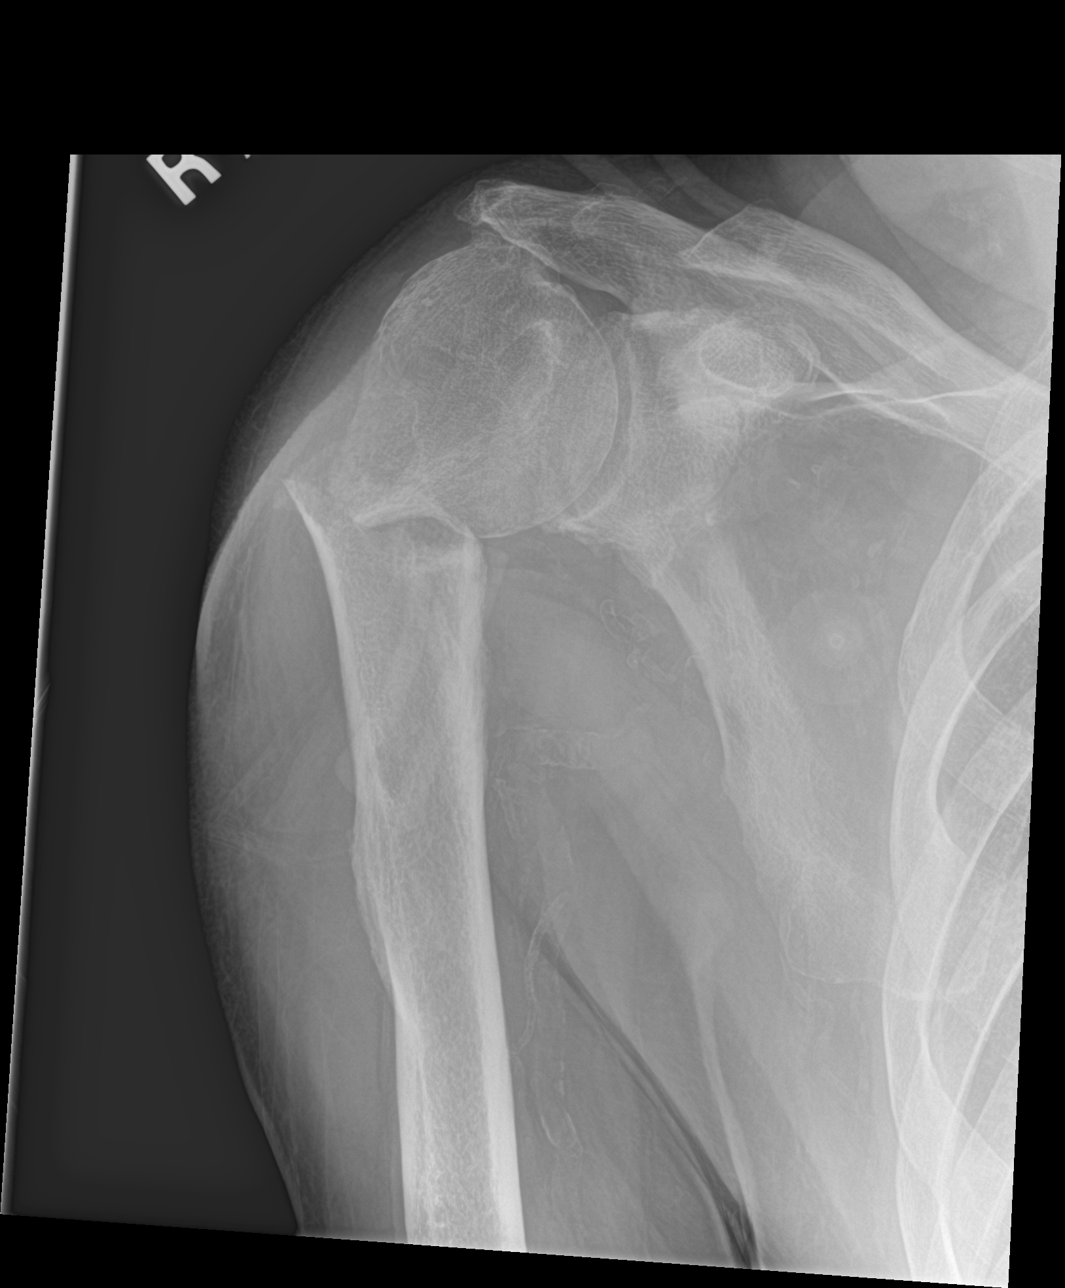
[im 2/2]
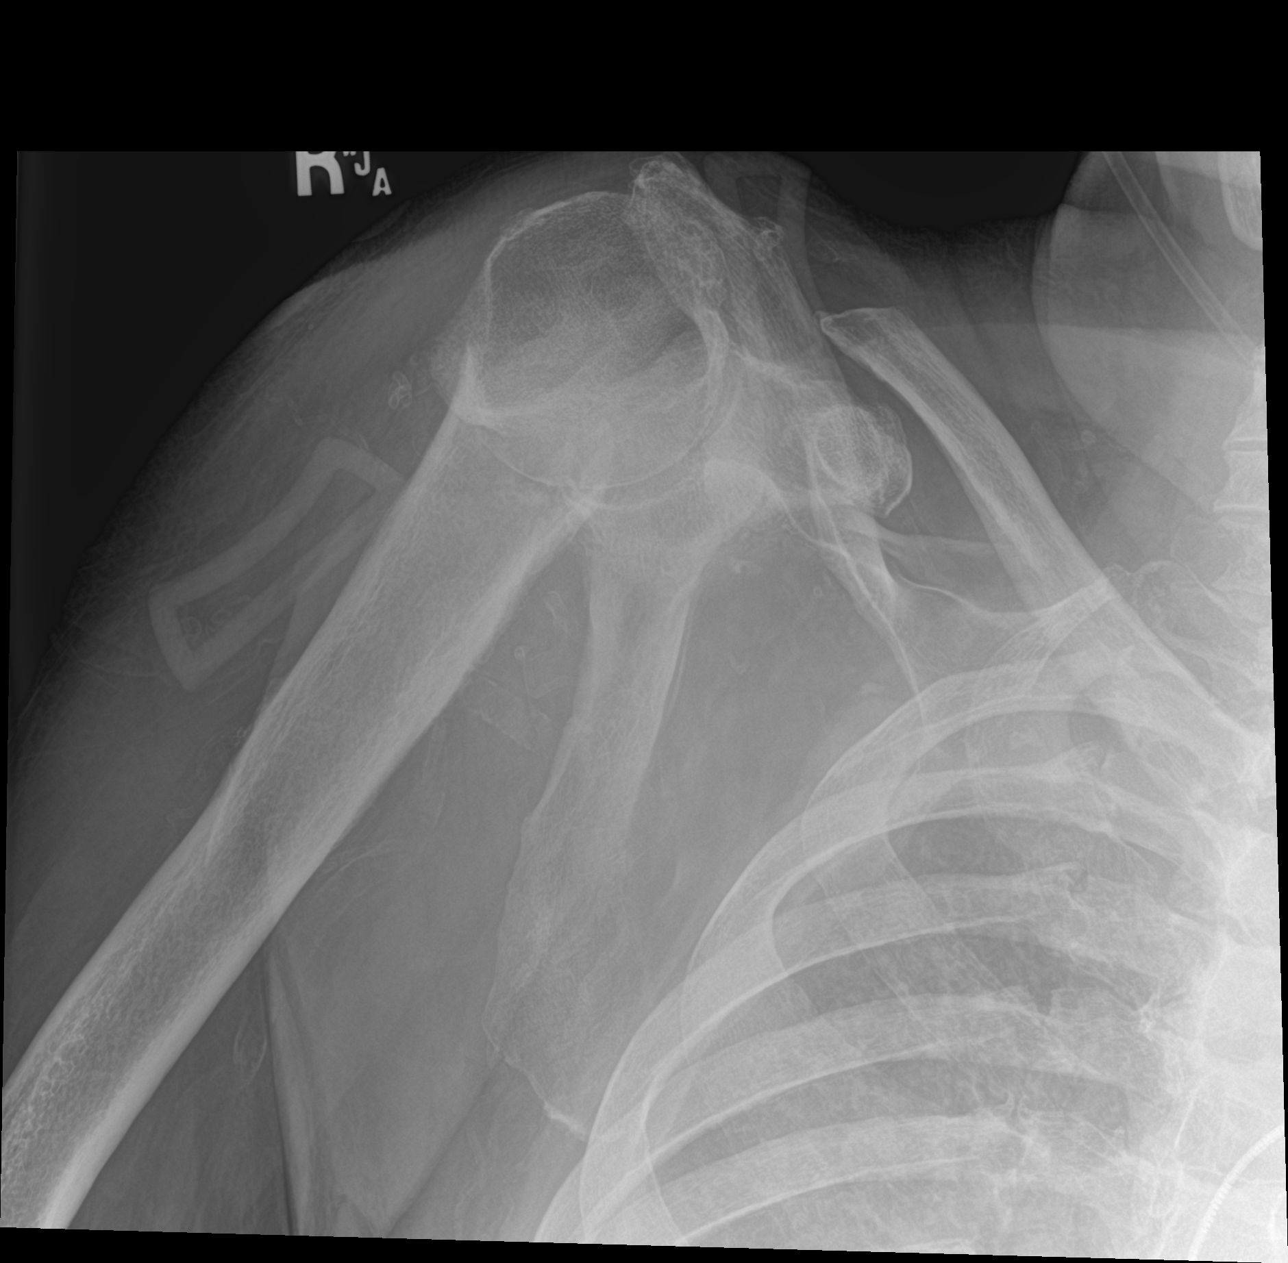

[shoulder y view]
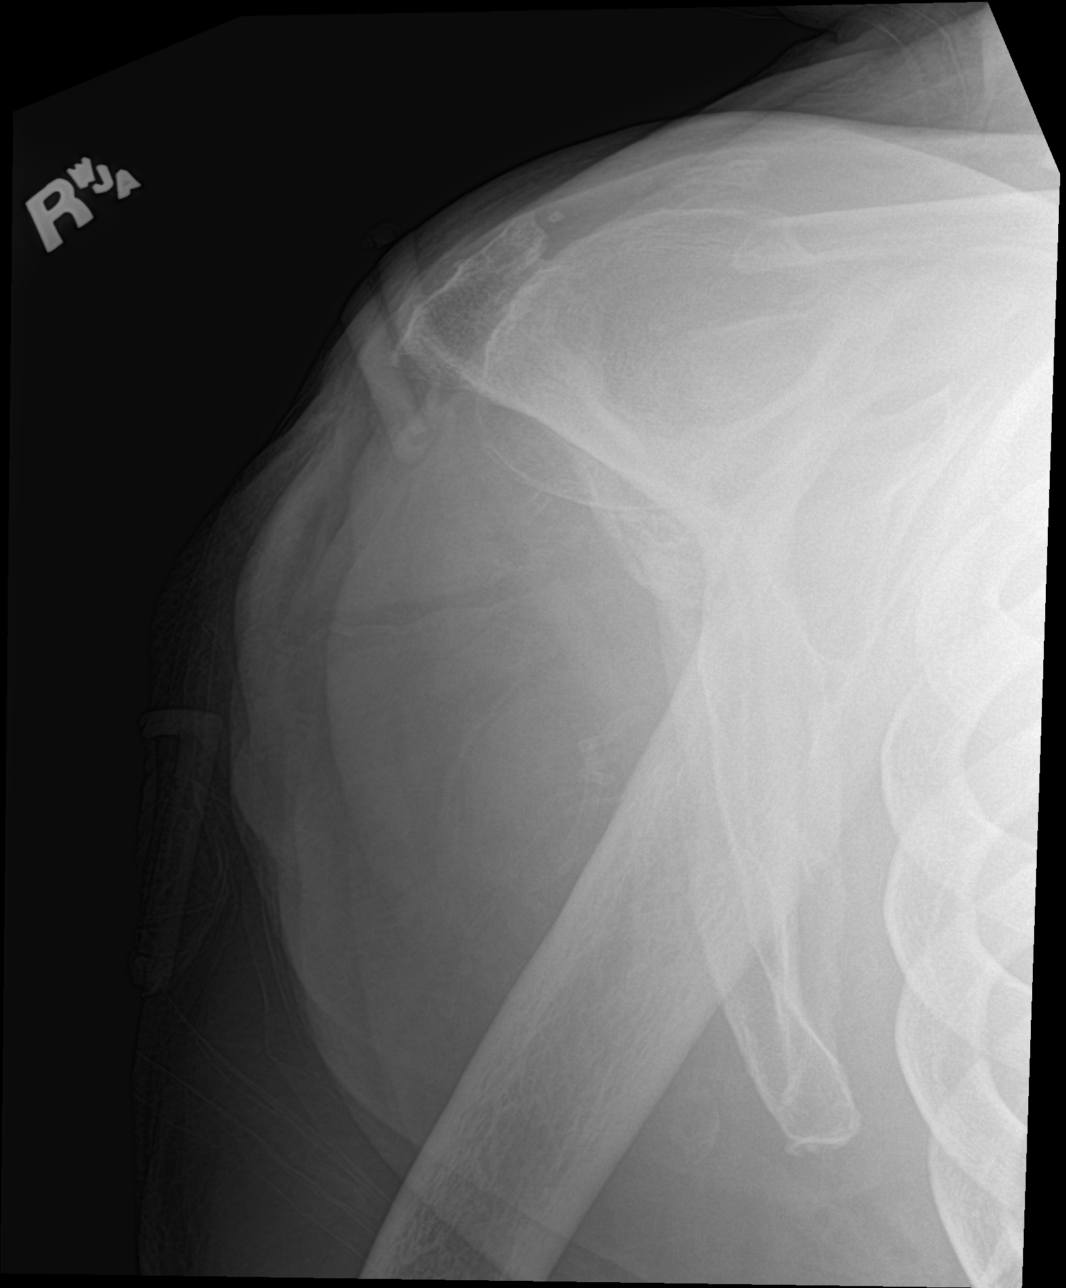

[3 of 3 positions shown; findings below may reference images not displayed]

FINDINGS: There is superior subluxation of the right humeral head, with mildly
increased widening of the right acromioclavicular joint to 4 cm.

There is also increased anterior displacement of the distal humerus,
measuring approximately 1 shaft width at the level of the humeral
neck fracture. Overlying soft tissue swelling is noted. No
additional fractures are identified. There appears to be some degree
of winging of the scapula.

Diffuse vascular calcifications are seen.
IMPRESSION: 1. Increased anterior displacement of the distal humerus, measuring
approximately 1 shaft width at the level of the patient's right
humeral neck fracture.
2. Superior subluxation of the right humeral head, with mildly
increased widening of the right acromioclavicular joint to 4 cm.
3. Diffuse vascular calcifications seen.

## 2018-04-20 IMAGING — US US THORACENTESIS ASP PLEURAL SPACE W/IMG GUIDE
1 series · 5 of 5 positions shown · non-contrast
Comparison: none

INDICATION: Shortness of breath. Large right pleural effusion. Request
therapeutic thoracentesis.

[Series 1: us thoracentesis asp pleural space w/img guide · 0.26mm/px · 5 of 5 slices shown]
[im 1/5]
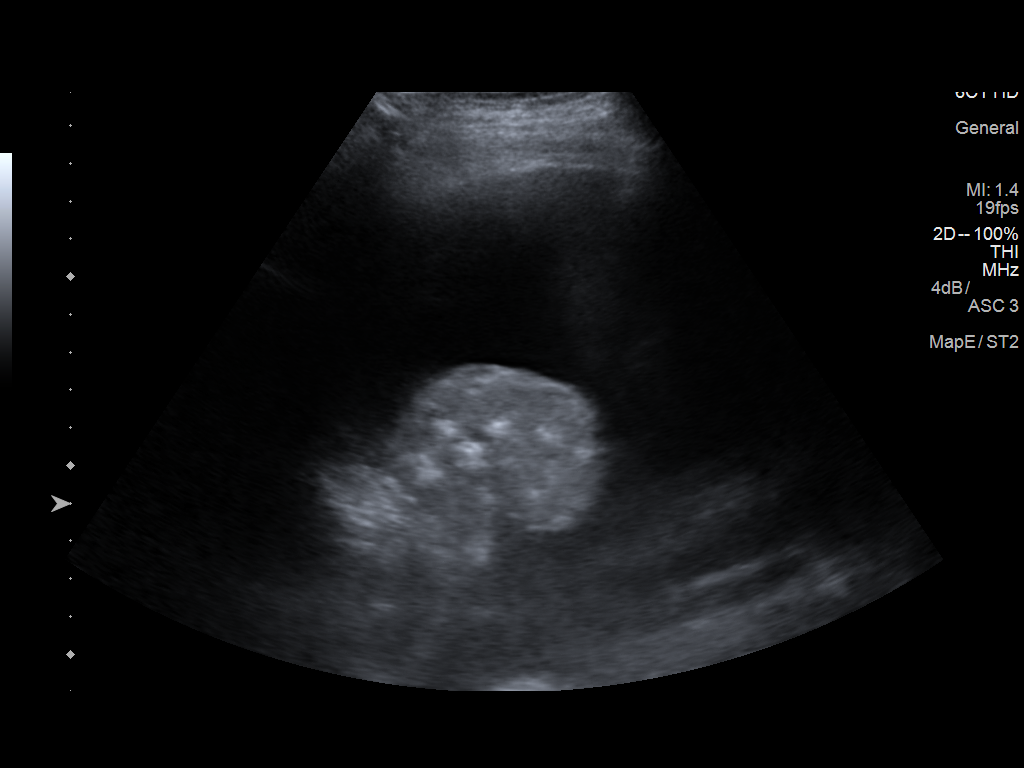
[im 2/5]
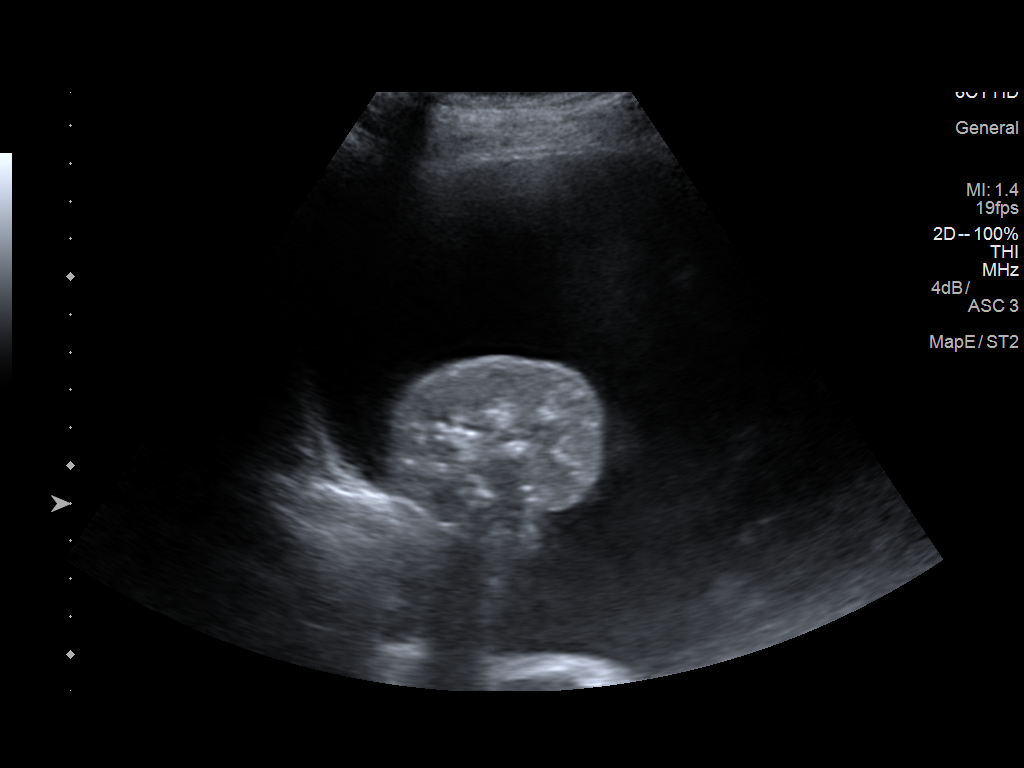
[im 3/5]
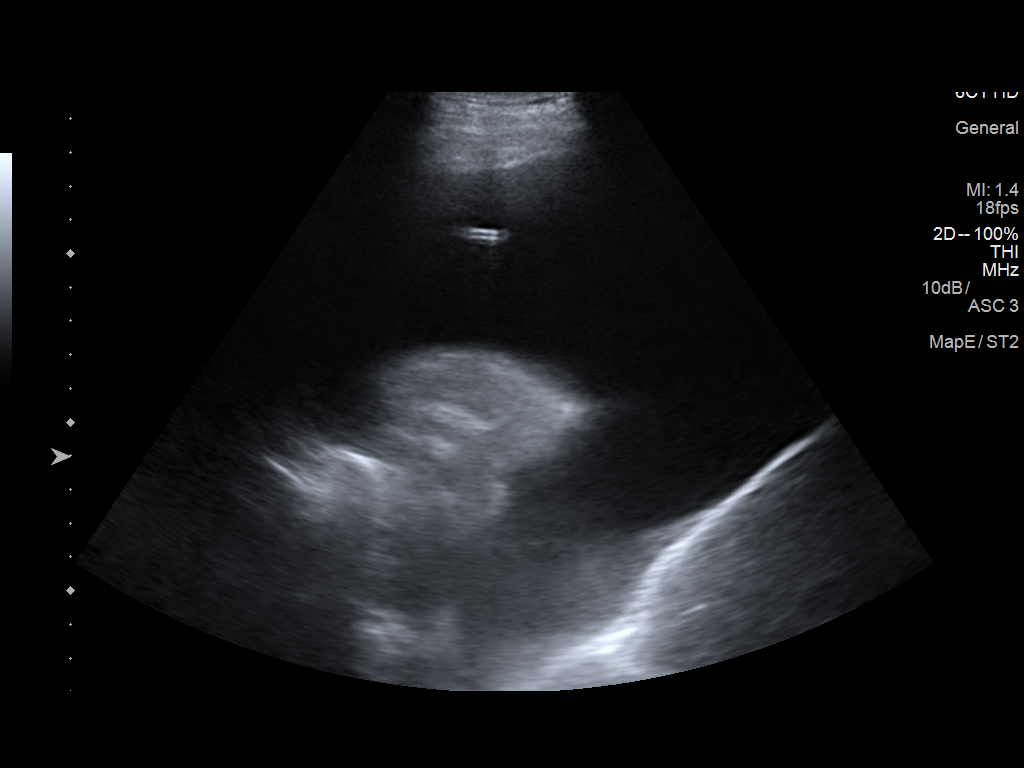
[im 4/5]
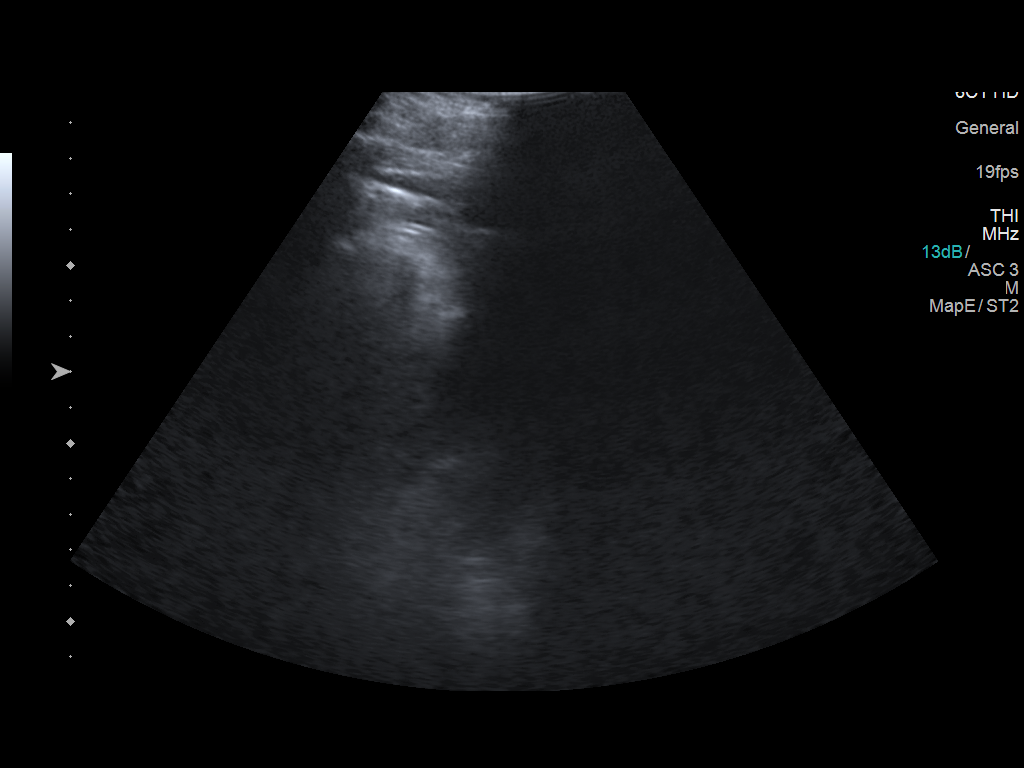
[im 5/5]
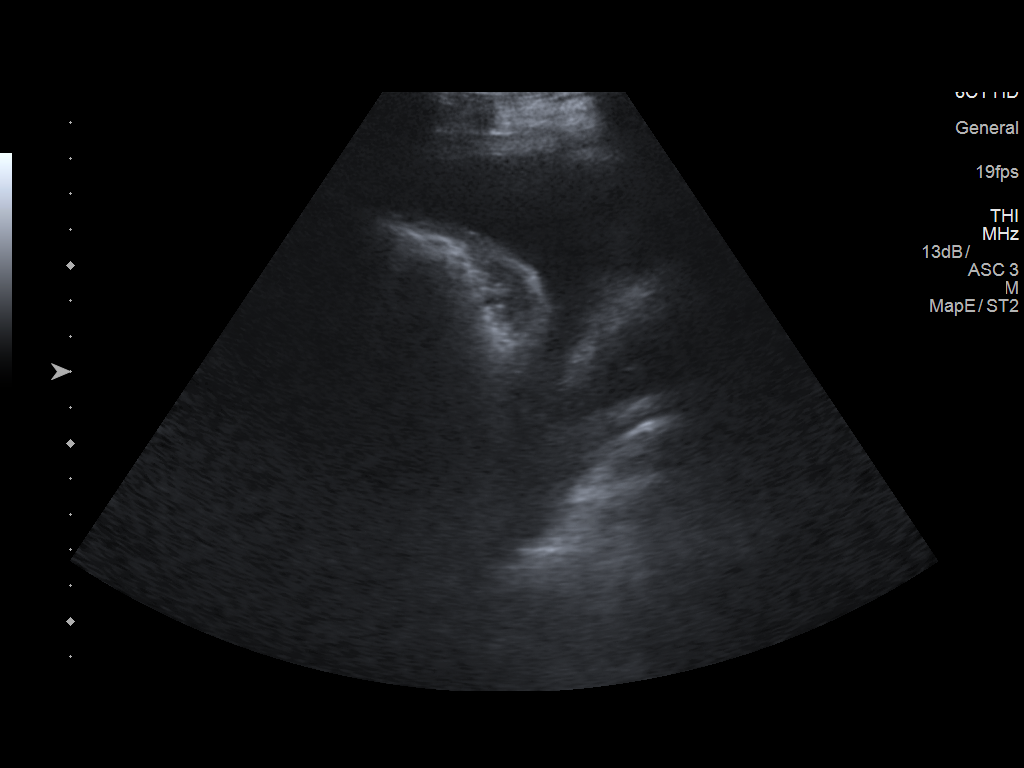

[5 of 5 positions shown; findings below may reference images not displayed]

EXAM:
ULTRASOUND GUIDED RIGHT THORACENTESIS

MEDICATIONS:
None.

COMPLICATIONS:
None immediate.

PROCEDURE:
An ultrasound guided thoracentesis was thoroughly discussed with the
patient and questions answered. The benefits, risks, alternatives
and complications were also discussed. The patient understands and
wishes to proceed with the procedure. Written consent was obtained.

Ultrasound was performed to localize and mark an adequate pocket of
fluid in the right chest. The area was then prepped and draped in
the normal sterile fashion. 1% Lidocaine was used for local
anesthesia. Under ultrasound guidance a Safe-T-Centesis catheter was
introduced. Thoracentesis was performed. The catheter was removed
and a dressing applied.
FINDINGS: A total of approximately 2.3 L of clear, amber colored fluid was
removed.
IMPRESSION: Successful ultrasound guided right thoracentesis yielding 2.3 L of
pleural fluid.
